# Patient Record
Sex: Female | Born: 1945 | Race: White | Hispanic: No | Marital: Married | Smoking: Never smoker
Health system: Southern US, Community
[De-identification: ages and names within clinical notes are randomized; demographics above are authoritative.]

## PROBLEM LIST (undated history)

## (undated) DIAGNOSIS — F419 Anxiety disorder, unspecified: Secondary | ICD-10-CM

## (undated) DIAGNOSIS — Z8719 Personal history of other diseases of the digestive system: Secondary | ICD-10-CM

## (undated) DIAGNOSIS — G8929 Other chronic pain: Secondary | ICD-10-CM

## (undated) DIAGNOSIS — J189 Pneumonia, unspecified organism: Secondary | ICD-10-CM

## (undated) DIAGNOSIS — N390 Urinary tract infection, site not specified: Secondary | ICD-10-CM

## (undated) DIAGNOSIS — M4800 Spinal stenosis, site unspecified: Secondary | ICD-10-CM

## (undated) DIAGNOSIS — M549 Dorsalgia, unspecified: Secondary | ICD-10-CM

## (undated) DIAGNOSIS — T7840XA Allergy, unspecified, initial encounter: Secondary | ICD-10-CM

## (undated) DIAGNOSIS — E119 Type 2 diabetes mellitus without complications: Secondary | ICD-10-CM

## (undated) DIAGNOSIS — M1990 Unspecified osteoarthritis, unspecified site: Secondary | ICD-10-CM

## (undated) DIAGNOSIS — K219 Gastro-esophageal reflux disease without esophagitis: Secondary | ICD-10-CM

## (undated) DIAGNOSIS — H269 Unspecified cataract: Secondary | ICD-10-CM

## (undated) DIAGNOSIS — I10 Essential (primary) hypertension: Secondary | ICD-10-CM

## (undated) DIAGNOSIS — R0600 Dyspnea, unspecified: Secondary | ICD-10-CM

## (undated) DIAGNOSIS — Z5189 Encounter for other specified aftercare: Secondary | ICD-10-CM

## (undated) DIAGNOSIS — E785 Hyperlipidemia, unspecified: Secondary | ICD-10-CM

## (undated) HISTORY — DX: Unspecified cataract: H26.9

## (undated) HISTORY — DX: Essential (primary) hypertension: I10

## (undated) HISTORY — DX: Allergy, unspecified, initial encounter: T78.40XA

## (undated) HISTORY — DX: Encounter for other specified aftercare: Z51.89

## (undated) HISTORY — DX: Anxiety disorder, unspecified: F41.9

## (undated) HISTORY — DX: Spinal stenosis, site unspecified: M48.00

## (undated) HISTORY — DX: Hyperlipidemia, unspecified: E78.5

## (undated) HISTORY — DX: Unspecified osteoarthritis, unspecified site: M19.90

## (undated) HISTORY — DX: Urinary tract infection, site not specified: N39.0

## (undated) HISTORY — DX: Gastro-esophageal reflux disease without esophagitis: K21.9

## (undated) HISTORY — DX: Other chronic pain: G89.29

## (undated) HISTORY — PX: APPENDECTOMY: SHX54

## (undated) HISTORY — DX: Dorsalgia, unspecified: M54.9

## (undated) HISTORY — PX: ABDOMINAL HYSTERECTOMY: SHX81

## (undated) HISTORY — PX: BACK SURGERY: SHX140

---

## 1971-11-20 HISTORY — PX: ABDOMINAL HYSTERECTOMY: SHX81

## 1977-11-19 HISTORY — PX: TONSILLECTOMY AND ADENOIDECTOMY: SUR1326

## 1978-04-19 HISTORY — PX: SEPTOPLASTY: SUR1290

## 1993-11-19 HISTORY — PX: CHOLECYSTECTOMY: SHX55

## 2003-08-20 HISTORY — PX: OTHER SURGICAL HISTORY: SHX169

## 2004-12-04 ENCOUNTER — Ambulatory Visit: Payer: Self-pay | Admitting: Unknown Physician Specialty

## 2006-02-14 ENCOUNTER — Ambulatory Visit: Payer: Self-pay | Admitting: Unknown Physician Specialty

## 2006-08-19 HISTORY — PX: HERNIA REPAIR: SHX51

## 2007-02-17 ENCOUNTER — Ambulatory Visit: Payer: Self-pay | Admitting: Unknown Physician Specialty

## 2007-02-24 ENCOUNTER — Ambulatory Visit: Payer: Self-pay | Admitting: Gastroenterology

## 2007-07-08 LAB — HM COLONOSCOPY: HM Colonoscopy: NORMAL

## 2008-01-05 ENCOUNTER — Emergency Department: Payer: Self-pay | Admitting: Emergency Medicine

## 2008-02-18 ENCOUNTER — Ambulatory Visit: Payer: Self-pay | Admitting: Unknown Physician Specialty

## 2009-03-10 ENCOUNTER — Ambulatory Visit: Payer: Self-pay | Admitting: Unknown Physician Specialty

## 2010-04-19 ENCOUNTER — Ambulatory Visit: Payer: Self-pay | Admitting: Unknown Physician Specialty

## 2010-11-28 ENCOUNTER — Emergency Department: Payer: Self-pay | Admitting: Internal Medicine

## 2010-12-14 ENCOUNTER — Encounter
Admission: RE | Admit: 2010-12-14 | Discharge: 2010-12-14 | Payer: Self-pay | Source: Home / Self Care | Attending: Orthopedic Surgery | Admitting: Orthopedic Surgery

## 2010-12-18 ENCOUNTER — Other Ambulatory Visit: Payer: Self-pay | Admitting: Orthopedic Surgery

## 2010-12-18 DIAGNOSIS — M545 Low back pain, unspecified: Secondary | ICD-10-CM

## 2010-12-18 DIAGNOSIS — M4310 Spondylolisthesis, site unspecified: Secondary | ICD-10-CM

## 2011-01-03 ENCOUNTER — Other Ambulatory Visit: Payer: Self-pay

## 2011-03-21 ENCOUNTER — Ambulatory Visit
Admission: RE | Admit: 2011-03-21 | Discharge: 2011-03-21 | Disposition: A | Discharge status: Home / Self Care | Payer: Medicare Other | Source: Ambulatory Visit | Attending: Orthopedic Surgery | Admitting: Orthopedic Surgery

## 2011-03-21 DIAGNOSIS — M545 Low back pain, unspecified: Secondary | ICD-10-CM

## 2011-03-21 DIAGNOSIS — M4310 Spondylolisthesis, site unspecified: Secondary | ICD-10-CM

## 2011-04-24 ENCOUNTER — Ambulatory Visit: Payer: Self-pay | Admitting: Unknown Physician Specialty

## 2011-06-13 ENCOUNTER — Other Ambulatory Visit: Payer: Self-pay | Admitting: Orthopedic Surgery

## 2011-06-13 DIAGNOSIS — M545 Low back pain, unspecified: Secondary | ICD-10-CM

## 2011-06-13 DIAGNOSIS — M4310 Spondylolisthesis, site unspecified: Secondary | ICD-10-CM

## 2011-06-15 ENCOUNTER — Ambulatory Visit
Admission: RE | Admit: 2011-06-15 | Discharge: 2011-06-15 | Disposition: A | Discharge status: Home / Self Care | Payer: Medicare Other | Source: Ambulatory Visit | Attending: Orthopedic Surgery | Admitting: Orthopedic Surgery

## 2011-06-15 DIAGNOSIS — M545 Low back pain, unspecified: Secondary | ICD-10-CM

## 2011-06-15 DIAGNOSIS — M4310 Spondylolisthesis, site unspecified: Secondary | ICD-10-CM

## 2011-06-15 MED ORDER — IOHEXOL 180 MG/ML  SOLN
1.0000 mL | Freq: Once | INTRAMUSCULAR | Status: AC | PRN
  Administered 2011-06-15: 1 mL via EPIDURAL

## 2011-06-15 MED ORDER — METHYLPREDNISOLONE ACETATE 40 MG/ML INJ SUSP (RADIOLOG
120.0000 mg | Freq: Once | INTRAMUSCULAR | Status: AC
  Administered 2011-06-15: 120 mg via EPIDURAL

## 2011-06-15 NOTE — Discharge Instructions (Signed)
 Post Procedure Spinal Discharge Instruction Sheet  1. You may resume a regular diet and any medications that you routinely take (including pain medications).  2. No driving day of procedure.  3. Light activity throughout the rest of the day.  Do not do any strenuous work, exercise, bending or lifting.  The day following the procedure, you can resume normal physical activity but you should refrain from exercising or physical therapy for at least three days thereafter.   Common Side Effects:   Headaches- take your usual medications as directed by your physician.  Increase your fluid intake.  Caffeinated beverages may be helpful.  Lie flat in bed until your headache resolves.   Restlessness or inability to sleep- you may have trouble sleeping for the next few days.  Ask your referring physician if you need any medication for sleep.   Facial flushing or redness- should subside within a few days.   Increased pain- a temporary increase in pain a day or two following your procedure is not unusual.  Take your pain medication as prescribed by your referring physician.   Leg cramps  Please contact our office at [phone] for the following symptoms:  Fever greater than 100 degrees.  Headaches unresolved with medication after 2-3 days.  Increased swelling, pain, or redness at injection site.  Thank you for visiting our office.

## 2011-08-24 ENCOUNTER — Other Ambulatory Visit: Payer: Self-pay | Admitting: Physician Assistant

## 2011-08-24 DIAGNOSIS — M549 Dorsalgia, unspecified: Secondary | ICD-10-CM

## 2011-08-28 ENCOUNTER — Ambulatory Visit
Admission: RE | Admit: 2011-08-28 | Discharge: 2011-08-28 | Disposition: A | Discharge status: Home / Self Care | Payer: Medicare Other | Source: Ambulatory Visit | Attending: Physician Assistant | Admitting: Physician Assistant

## 2011-08-28 DIAGNOSIS — M5126 Other intervertebral disc displacement, lumbar region: Secondary | ICD-10-CM

## 2011-08-28 DIAGNOSIS — M549 Dorsalgia, unspecified: Secondary | ICD-10-CM

## 2011-08-28 MED ORDER — IOHEXOL 180 MG/ML  SOLN
1.0000 mL | Freq: Once | INTRAMUSCULAR | Status: AC | PRN
  Administered 2011-08-28: 1 mL via EPIDURAL

## 2011-08-28 MED ORDER — METHYLPREDNISOLONE ACETATE 40 MG/ML INJ SUSP (RADIOLOG
120.0000 mg | Freq: Once | INTRAMUSCULAR | Status: AC
  Administered 2011-08-28: 120 mg via EPIDURAL

## 2011-08-28 NOTE — Discharge Instructions (Signed)
 Post Procedure Spinal Discharge Instruction Sheet  1. You may resume a regular diet and any medications that you routinely take (including pain medications).  2. No driving day of procedure.  3. Light activity throughout the rest of the day.  Do not do any strenuous work, exercise, bending or lifting.  The day following the procedure, you can resume normal physical activity but you should refrain from exercising or physical therapy for at least three days thereafter.   Common Side Effects:   Headaches- take your usual medications as directed by your physician.  Increase your fluid intake.  Caffeinated beverages may be helpful.  Lie flat in bed until your headache resolves.   Restlessness or inability to sleep- you may have trouble sleeping for the next few days.  Ask your referring physician if you need any medication for sleep.   Facial flushing or redness- should subside within a few days.   Increased pain- a temporary increase in pain a day or two following your procedure is not unusual.  Take your pain medication as prescribed by your referring physician.   Leg cramps  Please contact our office at [phone] for the following symptoms:  Fever greater than 100 degrees.  Headaches unresolved with medication after 2-3 days.  Increased swelling, pain, or redness at injection site.  Thank you for visiting our office.

## 2011-10-16 ENCOUNTER — Other Ambulatory Visit: Payer: Self-pay | Admitting: Physician Assistant

## 2011-10-16 DIAGNOSIS — M545 Low back pain, unspecified: Secondary | ICD-10-CM

## 2011-10-18 ENCOUNTER — Ambulatory Visit
Admission: RE | Admit: 2011-10-18 | Discharge: 2011-10-18 | Disposition: A | Discharge status: Home / Self Care | Payer: Medicare Other | Source: Ambulatory Visit | Attending: Physician Assistant | Admitting: Physician Assistant

## 2011-10-18 VITALS — BP 127/56 | HR 49

## 2011-10-18 DIAGNOSIS — M545 Low back pain, unspecified: Secondary | ICD-10-CM

## 2011-10-18 DIAGNOSIS — G8929 Other chronic pain: Secondary | ICD-10-CM | POA: Insufficient documentation

## 2011-10-18 MED ORDER — METHYLPREDNISOLONE ACETATE 40 MG/ML INJ SUSP (RADIOLOG
120.0000 mg | Freq: Once | INTRAMUSCULAR | Status: AC
  Administered 2011-10-18: 120 mg via EPIDURAL

## 2011-10-18 MED ORDER — IOHEXOL 180 MG/ML  SOLN
1.0000 mL | Freq: Once | INTRAMUSCULAR | Status: AC | PRN
  Administered 2011-10-18: 1 mL via EPIDURAL

## 2011-12-03 ENCOUNTER — Ambulatory Visit: Payer: Self-pay

## 2012-01-08 LAB — HM PAP SMEAR

## 2012-02-18 HISTORY — PX: TOTAL HIP ARTHROPLASTY: SHX124

## 2012-02-25 ENCOUNTER — Ambulatory Visit: Payer: Self-pay | Admitting: General Practice

## 2012-02-25 LAB — PROTIME-INR
INR: 0.9
Prothrombin Time: 12.8 s

## 2012-02-25 LAB — URINALYSIS, COMPLETE
Bilirubin,UR: NEGATIVE
Blood: NEGATIVE
Glucose,UR: NEGATIVE mg/dL
Ketone: NEGATIVE
Nitrite: NEGATIVE
Ph: 7
Protein: NEGATIVE
RBC,UR: 1 /HPF
Specific Gravity: 1.017
Squamous Epithelial: 6
WBC UR: 3 /HPF

## 2012-02-25 LAB — CBC
HCT: 37.2 %
HGB: 12.4 g/dL
MCH: 29.4 pg
MCHC: 33.3 g/dL
MCV: 88 fL
Platelet: 236 10*3/uL
RBC: 4.22 X10 6/mm 3
RDW: 13.7 %
WBC: 5.7 10*3/uL

## 2012-02-25 LAB — BASIC METABOLIC PANEL WITH GFR
Anion Gap: 10
BUN: 20 mg/dL — ABNORMAL HIGH
Calcium, Total: 10.1 mg/dL
Chloride: 102 mmol/L
Co2: 28 mmol/L
Creatinine: 1.01 mg/dL
EGFR (African American): >60
EGFR (Non-African Amer.): 58 — ABNORMAL LOW
Glucose: 135 mg/dL — ABNORMAL HIGH
Osmolality: 284
Potassium: 3.4 mmol/L — ABNORMAL LOW
Sodium: 140 mmol/L

## 2012-02-25 LAB — APTT: Activated PTT: 32 s

## 2012-02-25 LAB — SEDIMENTATION RATE: Erythrocyte Sed Rate: 16 mm/h

## 2012-02-25 LAB — MRSA PCR SCREENING

## 2012-02-27 LAB — URINE CULTURE

## 2012-03-12 ENCOUNTER — Inpatient Hospital Stay: Payer: Self-pay | Admitting: General Practice

## 2012-03-13 LAB — BASIC METABOLIC PANEL WITH GFR
Anion Gap: 12
BUN: 12 mg/dL
Calcium, Total: 8.7 mg/dL
Chloride: 107 mmol/L
Co2: 23 mmol/L
Creatinine: 0.95 mg/dL
EGFR (African American): >60
EGFR (Non-African Amer.): >60
Glucose: 133 mg/dL — ABNORMAL HIGH
Osmolality: 285
Potassium: 3.2 mmol/L — ABNORMAL LOW
Sodium: 142 mmol/L

## 2012-03-13 LAB — PLATELET COUNT: Platelet: 146 10*3/uL — ABNORMAL LOW

## 2012-03-13 LAB — HEMOGLOBIN: HGB: 9.8 g/dL — ABNORMAL LOW

## 2012-03-13 LAB — PATHOLOGY REPORT

## 2012-03-14 ENCOUNTER — Encounter: Payer: Self-pay | Admitting: Internal Medicine

## 2012-03-14 LAB — HEMOGLOBIN: HGB: 10 g/dL — ABNORMAL LOW

## 2012-03-14 LAB — BASIC METABOLIC PANEL WITH GFR
Anion Gap: 10
BUN: 8 mg/dL
Calcium, Total: 9.2 mg/dL
Chloride: 101 mmol/L
Co2: 26 mmol/L
Creatinine: 0.85 mg/dL
EGFR (African American): >60
EGFR (Non-African Amer.): >60
Glucose: 141 mg/dL — ABNORMAL HIGH
Osmolality: 275
Potassium: 3.8 mmol/L
Sodium: 137 mmol/L

## 2012-03-14 LAB — PLATELET COUNT: Platelet: 158 10*3/uL

## 2012-03-19 ENCOUNTER — Encounter: Payer: Self-pay | Admitting: Internal Medicine

## 2012-03-21 LAB — URINALYSIS, COMPLETE
Bilirubin,UR: NEGATIVE
Blood: NEGATIVE
Glucose,UR: NEGATIVE mg/dL
Ketone: NEGATIVE
Nitrite: NEGATIVE
Ph: 7
Protein: NEGATIVE
RBC,UR: 1 /HPF
Specific Gravity: 1.012
Squamous Epithelial: 3
WBC UR: 4 /HPF

## 2012-04-19 LAB — HM MAMMOGRAPHY

## 2012-05-14 ENCOUNTER — Ambulatory Visit: Payer: Self-pay | Admitting: Internal Medicine

## 2012-06-04 ENCOUNTER — Other Ambulatory Visit: Payer: Self-pay | Admitting: Orthopedic Surgery

## 2012-06-04 DIAGNOSIS — M549 Dorsalgia, unspecified: Secondary | ICD-10-CM

## 2012-06-09 ENCOUNTER — Ambulatory Visit
Admission: RE | Admit: 2012-06-09 | Discharge: 2012-06-09 | Disposition: A | Discharge status: Home / Self Care | Payer: Medicare Other | Source: Ambulatory Visit | Attending: Orthopedic Surgery | Admitting: Orthopedic Surgery

## 2012-06-09 DIAGNOSIS — M549 Dorsalgia, unspecified: Secondary | ICD-10-CM

## 2012-06-09 MED ORDER — IOHEXOL 180 MG/ML  SOLN
1.0000 mL | Freq: Once | INTRAMUSCULAR | Status: AC | PRN
  Administered 2012-06-09: 1 mL via EPIDURAL

## 2012-06-09 MED ORDER — METHYLPREDNISOLONE ACETATE 40 MG/ML INJ SUSP (RADIOLOG
120.0000 mg | Freq: Once | INTRAMUSCULAR | Status: AC
  Administered 2012-06-09: 120 mg via EPIDURAL

## 2012-06-09 NOTE — Discharge Instructions (Signed)
 Post Procedure Spinal Discharge Instruction Sheet  1. You may resume a regular diet and any medications that you routinely take (including pain medications).  2. No driving day of procedure.  3. Light activity throughout the rest of the day.  Do not do any strenuous work, exercise, bending or lifting.  The day following the procedure, you can resume normal physical activity but you should refrain from exercising or physical therapy for at least three days thereafter.   Common Side Effects:   Headaches- take your usual medications as directed by your physician.  Increase your fluid intake.  Caffeinated beverages may be helpful.  Lie flat in bed until your headache resolves.   Restlessness or inability to sleep- you may have trouble sleeping for the next few days.  Ask your referring physician if you need any medication for sleep.   Facial flushing or redness- should subside within a few days.   Increased pain- a temporary increase in pain a day or two following your procedure is not unusual.  Take your pain medication as prescribed by your referring physician.   Leg cramps  Please contact our office at [phone] for the following symptoms:  Fever greater than 100 degrees.  Headaches unresolved with medication after 2-3 days.  Increased swelling, pain, or redness at injection site.  Thank you for visiting our office.

## 2012-06-10 ENCOUNTER — Other Ambulatory Visit: Payer: Medicare Other

## 2012-07-07 ENCOUNTER — Encounter: Payer: Self-pay | Admitting: Internal Medicine

## 2012-07-07 ENCOUNTER — Ambulatory Visit (INDEPENDENT_AMBULATORY_CARE_PROVIDER_SITE_OTHER): Payer: Medicare Other | Admitting: Internal Medicine

## 2012-07-07 VITALS — BP 142/70 | HR 68 | Temp 99.2°F | Ht 64.5 in | Wt 219.5 lb

## 2012-07-07 DIAGNOSIS — I10 Essential (primary) hypertension: Secondary | ICD-10-CM | POA: Insufficient documentation

## 2012-07-07 DIAGNOSIS — G4700 Insomnia, unspecified: Secondary | ICD-10-CM | POA: Insufficient documentation

## 2012-07-07 DIAGNOSIS — J40 Bronchitis, not specified as acute or chronic: Secondary | ICD-10-CM | POA: Insufficient documentation

## 2012-07-07 DIAGNOSIS — E119 Type 2 diabetes mellitus without complications: Secondary | ICD-10-CM | POA: Insufficient documentation

## 2012-07-07 DIAGNOSIS — E669 Obesity, unspecified: Secondary | ICD-10-CM | POA: Insufficient documentation

## 2012-07-07 MED ORDER — HYDROCOD POLST-CHLORPHEN POLST 10-8 MG/5ML PO LQCR: 5.0000 mL | Freq: Every evening | ORAL | Status: DC | PRN

## 2012-07-07 MED ORDER — AMOXICILLIN-POT CLAVULANATE 875-125 MG PO TABS: 1.0000 | ORAL_TABLET | Freq: Two times a day (BID) | ORAL | Status: DC

## 2012-07-07 MED ORDER — PREDNISONE (PAK) 10 MG PO TABS: ORAL_TABLET | ORAL | Status: DC

## 2012-07-07 NOTE — Assessment & Plan Note (Signed)
Blood sugars well-controlled per patient. Will get records on recent A1c.

## 2012-07-07 NOTE — Assessment & Plan Note (Signed)
BMI 37. Will get records on recent lab work including thyroid function and lipid profile. Will discuss plan for diet and nutrition at next visit once acute episode of bronchitis has resolved.
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
## 2012-07-07 NOTE — Assessment & Plan Note (Signed)
Symptoms well controlled with trazodone. Will continue. 

## 2012-07-07 NOTE — Assessment & Plan Note (Signed)
Symptoms and exam are consistent with bronchitis. Will start Augmentin and prednisone taper. Will restart Advair. Will continue albuterol as needed. Questioned why patient had sudden onset of recurrent bronchitis. Will review records from previous physician. Once this acute episode has resolved, consider PFTs.

## 2012-07-07 NOTE — Assessment & Plan Note (Signed)
Blood pressure well-controlled on current medications. Will continue. Will get records on recent renal function. 

## 2012-07-10 ENCOUNTER — Telehealth: Payer: Self-pay | Admitting: Internal Medicine

## 2012-07-10 NOTE — Telephone Encounter (Signed)
Can we see pt tomorrow?  Pt could hold augmentin tonight and be seen tomorrow as long as no acute changes overnight.

## 2012-07-10 NOTE — Telephone Encounter (Signed)
Patient notified, appt has been made for tomorrow at 4:15.  She will go to urgent care or ER if any acute changes overnight.

## 2012-07-10 NOTE — Telephone Encounter (Signed)
Caller: Erin Camacho/Patient; Phone: [phone]; Reason for Call: Caller: Erin Camacho/Patient; Patient Name: Erin Camacho; PCP: Ronna Polio; Best Callback Phone Number: [phone].  Patient states she was seen in office 07/07/12 for cough/wheezing.  States was diagnosed with Bronchitis and prescribed Prednisone and Augmentin.  Patient states she developed symptoms of anxiety after taking Augmentin 07/09/12.  States felt like her heart was racing, flushing of face.  States she felt "real nervous" and "anxious.  " States she took a Xanax and symptoms relieved in approximately 30-45 minutes.  Patient states she does not want to continue Augmentin until symptoms are reviewed by Dr.  Ronna Polio.  Patient denies having rapid heart rate or feeling of anixety at present.  States last dosage of Augmentin was at 1730 07/09/12.  States Prednisone taper dose is 3 tablets 07/10/12 that she took at [facility] 07/10/12.  States dosage wil be 2 tablets 07/11/12.  Patient states she feels calm at present.  Patient states she continues to have frequent cough.  Patient states she did not fill prescription for Tussionex due to the cost.  States she has been taking over the counter Cheratussin ( Codiene, Guaifenesin) at bedtime with minimal relief.  Patient states she continues to have wheezing at night.  Patient denies wheezing at present.  Afebrile.  Triage per Anxiety and Cough Protocol.  No emergent symptoms identified.  Care advice given per guidelines.  Patient advised increased fluids, warm fluids, inhaled steam, humidifier.  Patient advised to use Advair as prescribed.  Call back parameters reviewed.  Patient verbalizes understanding.  RN attempted to schedule appointment within 4 hours due to positive triage assessment for New or worsening cough and known respiratory condition.  No appointments available for 07/10/12 in Epic Electronic Health Record.  Patient uses [facility] on [address] in [place] at  [phone].  Patient can be reached at [phone].   PLEASE RETURN CALL TO PATIENT WITH RECOMMENDATION REGARDING ABOVE SYMPTOMS.

## 2012-07-11 ENCOUNTER — Ambulatory Visit: Payer: Self-pay | Admitting: Internal Medicine

## 2012-07-11 ENCOUNTER — Ambulatory Visit (INDEPENDENT_AMBULATORY_CARE_PROVIDER_SITE_OTHER): Payer: Medicare Other | Admitting: Internal Medicine

## 2012-07-11 ENCOUNTER — Encounter: Payer: Self-pay | Admitting: Internal Medicine

## 2012-07-11 VITALS — BP 152/90 | HR 77 | Temp 99.8°F | Ht 64.5 in | Wt 219.2 lb

## 2012-07-11 DIAGNOSIS — J189 Pneumonia, unspecified organism: Secondary | ICD-10-CM | POA: Insufficient documentation

## 2012-07-11 MED ORDER — HYDROCODONE-ACETAMINOPHEN 5-500 MG PO TABS: 1.0000 | ORAL_TABLET | Freq: Three times a day (TID) | ORAL | Status: AC | PRN

## 2012-07-11 MED ORDER — LEVOFLOXACIN 750 MG PO TABS: 750.0000 mg | ORAL_TABLET | Freq: Every day | ORAL | Status: DC

## 2012-07-11 NOTE — Assessment & Plan Note (Signed)
Symptoms and exam are consistent with bronchitis versus early left-sided pneumonia. Patient did not tolerate Augmentin and appears to have allergy to this medication. Will change to Levaquin. She will continue to finish prednisone course. She will use hydrocodone as needed for cough. Chest x-ray today. Followup next week. Patient will call sooner if symptoms are worsening or not improving.

## 2012-07-11 NOTE — Progress Notes (Signed)
Subjective:    Patient ID: Erin Camacho, female    DOB: Jul 25, 1946, 66 y.o.   MRN: [id number]  HPI 66 year old female with history of diabetes presents for acute visit complaining of worsening shortness of breath, cough productive of purulent sputum, and fever. She was last seen on 07/07/2012 been diagnosed with early bronchitis. She was started on treatment with Augmentin. After taking approximately 4 doses of Augmentin, she developed nausea and itching on her skin. She has never had allergy in the past but.this may represent allergies to start medication. Symptoms improved after stopping medication. However, she continues to have fever, shortness of breath, and cough productive of purulent sputum. She denies chest pain. She has been using codeine for cough with minimal improvement.  Outpatient Encounter Prescriptions as of 07/11/2012  Medication Sig Dispense Refill  . ALPRAZolam (XANAX) 0.25 MG tablet Take 0.25 mg by mouth at bedtime as needed.      Marland Kitchen amoxicillin (AMOXIL) 500 MG tablet Take 500 mg by mouth every 4 (four) hours. For dental procedures      . calcium carbonate (OS-CAL) 600 MG TABS Take 600 mg by mouth 2 (two) times daily with a meal.      . Cholecalciferol (VITAMIN D-3) 1000 UNITS CAPS Take 1 capsule by mouth daily.      . fexofenadine (ALLEGRA) 180 MG tablet Take 180 mg by mouth daily.      . fluticasone (FLONASE) 50 MCG/ACT nasal spray Place 2 sprays into the nose daily.      Marland Kitchen losartan (COZAAR) 50 MG tablet Take 50 mg by mouth daily.      . metoprolol succinate (TOPROL XL) 25 MG 24 hr tablet Take 25 mg by mouth daily.      . Multiple Vitamin (MULTIVITAMIN) tablet Take 1 tablet by mouth daily.      Marland Kitchen omeprazole (PRILOSEC) 20 MG capsule Take 20 mg by mouth daily.      . predniSONE (STERAPRED UNI-PAK) 10 MG tablet Take 60mg  day 1 then taper by 10mg  daily  21 tablet  0  . simvastatin (ZOCOR) 10 MG tablet Take 5 mg by mouth at bedtime.      . traMADol (ULTRAM) 50 MG tablet Take 50 mg  by mouth every 4 (four) hours.      . traZODone (DESYREL) 50 MG tablet Take 50 mg by mouth at bedtime.      Marland Kitchen HYDROcodone-acetaminophen (VICODIN) 5-500 MG per tablet Take 1 tablet by mouth every 8 (eight) hours as needed (cough).  60 tablet  0  . levofloxacin (LEVAQUIN) 750 MG tablet Take 1 tablet (750 mg total) by mouth daily.  7 tablet  0   BP 152/90  Pulse 77  Temp 99.8 F (37.7 C) (Oral)  Ht 5' 4.5" (1.638 m)  Wt 219 lb 4 oz (99.451 kg)  BMI 37.05 kg/m2  SpO2 97%  Review of Systems  Constitutional: Positive for fever, chills and fatigue. Negative for appetite change and unexpected weight change.  HENT: Positive for congestion. Negative for ear pain, sore throat, trouble swallowing, neck pain, voice change and sinus pressure.   Eyes: Negative for visual disturbance.  Respiratory: Positive for cough and shortness of breath. Negative for wheezing and stridor.   Cardiovascular: Negative for chest pain, palpitations and leg swelling.  Genitourinary: Negative for dysuria and flank pain.  Musculoskeletal: Positive for myalgias. Negative for arthralgias and gait problem.  Skin: Negative for color change and rash.  Neurological: Negative for dizziness and headaches.  Hematological: Negative  for adenopathy. Does not bruise/bleed easily.       Objective:   Physical Exam  Constitutional: She is oriented to person, place, and time. She appears well-developed and well-nourished. No distress.  HENT:  Head: Normocephalic and atraumatic.  Right Ear: External ear normal.  Left Ear: External ear normal.  Nose: Nose normal.  Mouth/Throat: Oropharynx is clear and moist. No oropharyngeal exudate.  Eyes: Conjunctivae are normal. Pupils are equal, round, and reactive to light. Right eye exhibits no discharge. Left eye exhibits no discharge. No scleral icterus.  Neck: Normal range of motion. Neck supple. No tracheal deviation present. No thyromegaly present.  Cardiovascular: Normal rate, regular  rhythm, normal heart sounds and intact distal pulses.  Exam reveals no gallop and no friction rub.   No murmur heard. Pulmonary/Chest: Effort normal. No accessory muscle usage. Not tachypneic. No respiratory distress. She has no decreased breath sounds. She has no wheezes. She has rhonchi in the left upper field and the left middle field. She has no rales. She exhibits no tenderness.  Musculoskeletal: Normal range of motion. She exhibits no edema and no tenderness.  Lymphadenopathy:    She has no cervical adenopathy.  Neurological: She is alert and oriented to person, place, and time. No cranial nerve deficit. She exhibits normal muscle tone. Coordination normal.  Skin: Skin is warm and dry. No rash noted. She is not diaphoretic. No erythema. No pallor.  Psychiatric: She has a normal mood and affect. Her behavior is normal. Judgment and thought content normal.          Assessment & Plan:

## 2012-07-14 ENCOUNTER — Telehealth: Payer: Self-pay | Admitting: Internal Medicine

## 2012-07-14 NOTE — Telephone Encounter (Signed)
CXR normal.

## 2012-07-18 ENCOUNTER — Encounter: Payer: Self-pay | Admitting: Internal Medicine

## 2012-07-18 ENCOUNTER — Telehealth: Payer: Self-pay | Admitting: Internal Medicine

## 2012-07-18 ENCOUNTER — Ambulatory Visit (INDEPENDENT_AMBULATORY_CARE_PROVIDER_SITE_OTHER): Payer: Medicare Other | Admitting: Internal Medicine

## 2012-07-18 VITALS — BP 140/92 | HR 75 | Temp 99.3°F | Ht 64.5 in | Wt 215.2 lb

## 2012-07-18 DIAGNOSIS — R0989 Other specified symptoms and signs involving the circulatory and respiratory systems: Secondary | ICD-10-CM

## 2012-07-18 DIAGNOSIS — J40 Bronchitis, not specified as acute or chronic: Secondary | ICD-10-CM

## 2012-07-18 MED ORDER — METHYLPREDNISOLONE SODIUM SUCC 125 MG IJ SOLR
62.5000 mg | Freq: Once | INTRAMUSCULAR | Status: AC
  Administered 2012-07-18: 62.5 mg via INTRAMUSCULAR

## 2012-07-18 MED ORDER — PREDNISONE (PAK) 10 MG PO TABS: ORAL_TABLET | ORAL | Status: AC

## 2012-07-18 MED ORDER — ALBUTEROL SULFATE (2.5 MG/3ML) 0.083% IN NEBU: 2.5000 mg | INHALATION_SOLUTION | Freq: Four times a day (QID) | RESPIRATORY_TRACT | Status: DC | PRN

## 2012-07-18 MED ORDER — AZITHROMYCIN 250 MG PO TABS: ORAL_TABLET | ORAL | Status: DC

## 2012-07-18 NOTE — Telephone Encounter (Signed)
3:30 patient moved, Erin Camacho added to 3:00

## 2012-07-18 NOTE — Telephone Encounter (Signed)
Can we please try to move the 3:30 pm follow up in effort to see this pt, Erin Camacho at [employer]

## 2012-07-18 NOTE — Telephone Encounter (Signed)
Caller: Theme park manager; Patient Name: Erin Camacho; PCP: Ronna Polio; Best Callback Phone Number: [phone].  Pharmacist calling about nebulizer prescription.  Has Rx for albuterol neb solution, but needs Rx for nebulizer and diagnosis code.  Per Epic, Rx for "albuterol nebulizer solution 2.5 mg/60ml, 1 nebulizer treatment q 6 hours prn wheezing; please dispense with nebulizer machine."  Per Epic, diagnosis code 790 bronchitis; pharmacist advised; will dispense neb machine as ordered in original Rx.

## 2012-07-18 NOTE — Telephone Encounter (Signed)
Caller: Erin Camacho/Patient; Patient Name: Erin Camacho; PCP: Ronna Polio (Adults only); Best Callback Phone Number: [phone].  Call Regarding [place] Follow up.  Patient complains of Cough, Wheezing and difficulty breathing after finishing Antiobiotic on 8-29.  Productive Cough, yellow to green sputum.  Afebrile.  Audible wheezing, Patient denies struggling to breathe, states, difficulty to catch my breath, onset 2 weeks.  All emergent symptoms ruled out per Cough Protocol, see in 24 hours.  No appointments remaining on 8-30.  Please call Patient back to fit in due to productive cough with colored sputum and audible wheezing. Patient verbalized understanding.

## 2012-07-19 NOTE — Progress Notes (Signed)
Subjective:    Patient ID: Erin Camacho, female    DOB: 19-Sep-1946, 66 y.o.   MRN: [id number]  HPI 66YO female presents for acute visit c/o persistent fever, chills, cough productive of purulent sputum. She was treated for bronchitis last week with levaquin and prednisone with no improvement. CXR last week was normal. She notes persistent shortness of breath, wheezing, tightness in her chest. Using albuterol MDI with minimal improvement. Non-smoker. No h/o asthma. Does have h/o pneumonia in past.  Outpatient Encounter Prescriptions as of 07/18/2012  Medication Sig Dispense Refill  . ALPRAZolam (XANAX) 0.25 MG tablet Take 0.25 mg by mouth at bedtime as needed.      Marland Kitchen amoxicillin (AMOXIL) 500 MG tablet Take 500 mg by mouth every 4 (four) hours. For dental procedures      . calcium carbonate (OS-CAL) 600 MG TABS Take 600 mg by mouth 2 (two) times daily with a meal.      . Cholecalciferol (VITAMIN D-3) 1000 UNITS CAPS Take 1 capsule by mouth daily.      . fexofenadine (ALLEGRA) 180 MG tablet Take 180 mg by mouth daily.      . fluticasone (FLONASE) 50 MCG/ACT nasal spray Place 2 sprays into the nose daily.      Marland Kitchen HYDROcodone-acetaminophen (VICODIN) 5-500 MG per tablet Take 1 tablet by mouth every 8 (eight) hours as needed (cough).  60 tablet  0  . losartan (COZAAR) 50 MG tablet Take 50 mg by mouth daily.      . metoprolol succinate (TOPROL XL) 25 MG 24 hr tablet Take 25 mg by mouth daily.      . Multiple Vitamin (MULTIVITAMIN) tablet Take 1 tablet by mouth daily.      Marland Kitchen omeprazole (PRILOSEC) 20 MG capsule Take 20 mg by mouth daily.      . simvastatin (ZOCOR) 10 MG tablet Take 5 mg by mouth at bedtime.      . traMADol (ULTRAM) 50 MG tablet Take 50 mg by mouth every 4 (four) hours.      . traZODone (DESYREL) 50 MG tablet Take 50 mg by mouth at bedtime.      Marland Kitchen albuterol (PROVENTIL) (2.5 MG/3ML) 0.083% nebulizer solution Take 3 mLs (2.5 mg total) by nebulization every 6 (six) hours as needed for wheezing.  Please disp with nebulizer machine  150 mL  1  . azithromycin (ZITHROMAX Z-PAK) 250 MG tablet Take 2 pills day 1, then 1 pill daily days 2-5  6 each  0  . predniSONE (STERAPRED UNI-PAK) 10 MG tablet Take 60mg  day 1 then taper by 10mg  daily  21 tablet  0  . DISCONTD: levofloxacin (LEVAQUIN) 750 MG tablet Take 1 tablet (750 mg total) by mouth daily.  7 tablet  0  . DISCONTD: predniSONE (STERAPRED UNI-PAK) 10 MG tablet Take 60mg  day 1 then taper by 10mg  daily  21 tablet  0   Facility-Administered Encounter Medications as of 07/18/2012  Medication Dose Route Frequency Provider Last Rate Last Dose  . methylPREDNISolone sodium succinate (SOLU-MEDROL) 125 mg/2 mL injection 62.5 mg  62.5 mg Intramuscular Once Shelia Media, MD   62.5 mg at 07/18/12 1612    Review of Systems  Constitutional: Positive for fever, chills and fatigue. Negative for unexpected weight change.  HENT: Negative for hearing loss, ear pain, nosebleeds, congestion, sore throat, facial swelling, rhinorrhea, sneezing, mouth sores, trouble swallowing, neck pain, neck stiffness, voice change, postnasal drip, sinus pressure, tinnitus and ear discharge.   Eyes: Negative for pain,  discharge, redness and visual disturbance.  Respiratory: Positive for cough, chest tightness and shortness of breath. Negative for wheezing and stridor.   Cardiovascular: Negative for chest pain, palpitations and leg swelling.  Musculoskeletal: Negative for myalgias and arthralgias.  Skin: Negative for color change and rash.  Neurological: Negative for dizziness, weakness, light-headedness and headaches.  Hematological: Negative for adenopathy.       Objective:   Physical Exam  Constitutional: She is oriented to person, place, and time. She appears well-developed and well-nourished. No distress.  HENT:  Head: Normocephalic and atraumatic.  Right Ear: External ear normal.  Left Ear: External ear normal.  Nose: Nose normal.  Mouth/Throat: Oropharynx is  clear and moist. No oropharyngeal exudate.  Eyes: Conjunctivae are normal. Pupils are equal, round, and reactive to light. Right eye exhibits no discharge. Left eye exhibits no discharge. No scleral icterus.  Neck: Normal range of motion. Neck supple. No tracheal deviation present. No thyromegaly present.  Cardiovascular: Normal rate, regular rhythm, normal heart sounds and intact distal pulses.  Exam reveals no gallop and no friction rub.   No murmur heard. Pulmonary/Chest: Effort normal. No accessory muscle usage. Not tachypneic. No respiratory distress. She has no decreased breath sounds. She has wheezes. She has rhonchi. She has no rales. She exhibits no tenderness.  Musculoskeletal: Normal range of motion. She exhibits no edema and no tenderness.  Lymphadenopathy:    She has no cervical adenopathy.  Neurological: She is alert and oriented to person, place, and time. No cranial nerve deficit. She exhibits normal muscle tone. Coordination normal.  Skin: Skin is warm and dry. No rash noted. She is not diaphoretic. No erythema. No pallor.  Psychiatric: She has a normal mood and affect. Her behavior is normal. Judgment and thought content normal.          Assessment & Plan:

## 2012-07-19 NOTE — Assessment & Plan Note (Signed)
Symptoms and exam consistent with bronchitis. Pt is s/p treatment with Levaquin and Prednisone with minimal improvement. Will start course of azithromycin and repeat prednisone taper. Will also start albuterol by nebulizer at home. Pt will follow up next week. If any recurrent fever or chills or if symptoms not improving this weekend, may need admission for IV antibiotics, prednisone. Note that CXR last week was normal.

## 2012-07-22 MED ORDER — ALBUTEROL SULFATE (2.5 MG/3ML) 0.083% IN NEBU
2.5000 mg | INHALATION_SOLUTION | Freq: Once | RESPIRATORY_TRACT | Status: AC
  Administered 2012-07-22: 2.5 mg via RESPIRATORY_TRACT

## 2012-07-22 NOTE — Addendum Note (Signed)
Addended by: Gilmer Mor on: 07/22/2012 09:38 AM   Modules accepted: Orders

## 2012-07-23 ENCOUNTER — Ambulatory Visit (INDEPENDENT_AMBULATORY_CARE_PROVIDER_SITE_OTHER): Payer: Medicare Other | Admitting: Internal Medicine

## 2012-07-23 ENCOUNTER — Encounter: Payer: Self-pay | Admitting: Internal Medicine

## 2012-07-23 VITALS — BP 140/90 | HR 68 | Temp 98.7°F | Ht 64.5 in | Wt 219.2 lb

## 2012-07-23 DIAGNOSIS — J40 Bronchitis, not specified as acute or chronic: Secondary | ICD-10-CM

## 2012-07-23 NOTE — Progress Notes (Signed)
Subjective:    Patient ID: Erin Camacho, female    DOB: [birth year], 66 y.o.   MRN: [id number]  HPI 66 year old female presents for followup of recent episode of bronchitis. She reports that over the last few days symptoms have significantly improved. She continues to have some fatigue and occasional episodes of cough but denies shortness of breath, cough productive of purulent sputum, or chest pain. She denies fever or chills. She has one more day left on antibiotics and prednisone. She continues to use albuterol typically twice per day.  Outpatient Encounter Prescriptions as of 07/23/2012  Medication Sig Dispense Refill  . albuterol (PROVENTIL) (2.5 MG/3ML) 0.083% nebulizer solution Take 3 mLs (2.5 mg total) by nebulization every 6 (six) hours as needed for wheezing. Please disp with nebulizer machine  150 mL  1  . ALPRAZolam (XANAX) 0.25 MG tablet Take 0.25 mg by mouth at bedtime as needed.      Marland Kitchen amoxicillin (AMOXIL) 500 MG tablet Take 500 mg by mouth every 4 (four) hours. For dental procedures      . calcium carbonate (OS-CAL) 600 MG TABS Take 600 mg by mouth 2 (two) times daily with a meal.      . Cholecalciferol (VITAMIN D-3) 1000 UNITS CAPS Take 1 capsule by mouth daily.      . fexofenadine (ALLEGRA) 180 MG tablet Take 180 mg by mouth daily.      . fluticasone (FLONASE) 50 MCG/ACT nasal spray Place 2 sprays into the nose daily.      Marland Kitchen losartan (COZAAR) 50 MG tablet Take 50 mg by mouth daily.      . metoprolol succinate (TOPROL XL) 25 MG 24 hr tablet Take 25 mg by mouth daily.      . Multiple Vitamin (MULTIVITAMIN) tablet Take 1 tablet by mouth daily.      Marland Kitchen omeprazole (PRILOSEC) 20 MG capsule Take 20 mg by mouth daily.      . predniSONE (STERAPRED UNI-PAK) 10 MG tablet Take 60mg  day 1 then taper by 10mg  daily  21 tablet  0  . simvastatin (ZOCOR) 10 MG tablet Take 5 mg by mouth at bedtime.      . traMADol (ULTRAM) 50 MG tablet Take 50 mg by mouth every 4 (four) hours.      . traZODone  (DESYREL) 50 MG tablet Take 50 mg by mouth at bedtime.      Marland Kitchen DISCONTD: azithromycin (ZITHROMAX Z-PAK) 250 MG tablet Take 2 pills day 1, then 1 pill daily days 2-5  6 each  0    Review of Systems  Constitutional: Positive for fatigue. Negative for fever, chills, appetite change and unexpected weight change.  HENT: Negative for ear pain, congestion, sore throat, trouble swallowing, neck pain, voice change and sinus pressure.   Eyes: Negative for visual disturbance.  Respiratory: Positive for cough and wheezing. Negative for shortness of breath and stridor.   Cardiovascular: Negative for chest pain, palpitations and leg swelling.  Gastrointestinal: Negative for nausea, vomiting, abdominal pain, diarrhea, constipation, blood in stool, abdominal distention and anal bleeding.  Genitourinary: Negative for dysuria and flank pain.  Musculoskeletal: Negative for myalgias, arthralgias and gait problem.  Skin: Negative for color change and rash.  Neurological: Negative for dizziness and headaches.  Hematological: Negative for adenopathy. Does not bruise/bleed easily.  Psychiatric/Behavioral: Negative for suicidal ideas, disturbed wake/sleep cycle and dysphoric mood. The patient is not nervous/anxious.    BP 140/90  Pulse 68  Temp 98.7 F (37.1 C) (Oral)  Ht 5'  4.5" (1.638 m)  Wt 219 lb 4 oz (99.451 kg)  BMI 37.05 kg/m2  SpO2 97%     Objective:   Physical Exam  Constitutional: She is oriented to person, place, and time. She appears well-developed and well-nourished. No distress.  HENT:  Head: Normocephalic and atraumatic.  Right Ear: External ear normal.  Left Ear: External ear normal.  Nose: Nose normal.  Mouth/Throat: Oropharynx is clear and moist. No oropharyngeal exudate.  Eyes: Conjunctivae are normal. Pupils are equal, round, and reactive to light. Right eye exhibits no discharge. Left eye exhibits no discharge. No scleral icterus.  Neck: Normal range of motion. Neck supple. No  tracheal deviation present. No thyromegaly present.  Cardiovascular: Normal rate, regular rhythm, normal heart sounds and intact distal pulses.  Exam reveals no gallop and no friction rub.   No murmur heard. Pulmonary/Chest: Effort normal. No accessory muscle usage. Not tachypneic. No respiratory distress. She has decreased breath sounds. She has no wheezes. She has no rhonchi. She has no rales. She exhibits no tenderness.  Musculoskeletal: Normal range of motion. She exhibits no edema and no tenderness.  Lymphadenopathy:    She has no cervical adenopathy.  Neurological: She is alert and oriented to person, place, and time. No cranial nerve deficit. She exhibits normal muscle tone. Coordination normal.  Skin: Skin is warm and dry. No rash noted. She is not diaphoretic. No erythema. No pallor.  Psychiatric: She has a normal mood and affect. Her behavior is normal. Judgment and thought content normal.          Assessment & Plan:

## 2012-07-23 NOTE — Assessment & Plan Note (Signed)
Symptoms markedly improved with repeat course of prednisone and use of albuterol. Will continue to monitor. Followup in one month or sooner as needed.

## 2012-08-07 ENCOUNTER — Ambulatory Visit: Payer: Medicare Other | Admitting: Internal Medicine

## 2012-08-22 ENCOUNTER — Ambulatory Visit (INDEPENDENT_AMBULATORY_CARE_PROVIDER_SITE_OTHER): Payer: Medicare Other | Admitting: Internal Medicine

## 2012-08-22 ENCOUNTER — Encounter: Payer: Self-pay | Admitting: Internal Medicine

## 2012-08-22 VITALS — BP 112/82 | HR 70 | Temp 99.3°F | Ht 64.5 in | Wt 223.8 lb

## 2012-08-22 DIAGNOSIS — J309 Allergic rhinitis, unspecified: Secondary | ICD-10-CM

## 2012-08-22 DIAGNOSIS — E785 Hyperlipidemia, unspecified: Secondary | ICD-10-CM | POA: Insufficient documentation

## 2012-08-22 DIAGNOSIS — G4700 Insomnia, unspecified: Secondary | ICD-10-CM

## 2012-08-22 DIAGNOSIS — J019 Acute sinusitis, unspecified: Secondary | ICD-10-CM | POA: Insufficient documentation

## 2012-08-22 DIAGNOSIS — E7800 Pure hypercholesterolemia, unspecified: Secondary | ICD-10-CM | POA: Insufficient documentation

## 2012-08-22 DIAGNOSIS — I10 Essential (primary) hypertension: Secondary | ICD-10-CM

## 2012-08-22 DIAGNOSIS — R609 Edema, unspecified: Secondary | ICD-10-CM | POA: Insufficient documentation

## 2012-08-22 MED ORDER — LOSARTAN POTASSIUM-HCTZ 50-12.5 MG PO TABS: 1.0000 | ORAL_TABLET | Freq: Every day | ORAL | Status: DC

## 2012-08-22 MED ORDER — METOPROLOL SUCCINATE ER 25 MG PO TB24: 25.0000 mg | ORAL_TABLET | Freq: Every day | ORAL | Status: DC

## 2012-08-22 MED ORDER — TRAZODONE HCL 50 MG PO TABS: 50.0000 mg | ORAL_TABLET | Freq: Every day | ORAL | Status: DC

## 2012-08-22 MED ORDER — AZITHROMYCIN 250 MG PO TABS: ORAL_TABLET | ORAL | Status: DC

## 2012-08-22 MED ORDER — TRAMADOL HCL 50 MG PO TABS: 50.0000 mg | ORAL_TABLET | ORAL | Status: DC

## 2012-08-22 MED ORDER — FLUTICASONE PROPIONATE 50 MCG/ACT NA SUSP: 2.0000 | Freq: Every day | NASAL | Status: DC

## 2012-08-22 NOTE — Assessment & Plan Note (Signed)
Symptoms most consistent with acute sinusitis. Will treat with azithromycin. Patient will continue afrin as needed at bedtime. Continue prn mucinex and allegra. Followup if symptoms are not improving over the next several days.

## 2012-08-22 NOTE — Assessment & Plan Note (Signed)
Pressure is well-controlled with no certain however patient having some issues with edema, so will change to losartan hydrochlorothiazide. Patient will followup in one month for recheck.

## 2012-08-22 NOTE — Progress Notes (Signed)
Subjective:    Patient ID: Erin Camacho, female    DOB: 03-Oct-1946, 66 y.o.   MRN: [id number]  HPI 66 year old female with history of diet controlled diabetes, hypertension presents for acute visit complaining of one-week history of nasal congestion and sinus pressure. She was recently treated for bronchitis approximately one month ago and reports that symptoms had improved significantly until about one or 2 weeks ago when she started to develop clear nasal congestion and frontal sinus pressure. She has also had some fever but no chills. She has occasional nonproductive cough. She denies any shortness of breath. She has been using Allegra with no improvement in her symptoms. She has also been using occasional Afrin spray with no improvement. Next  She is also concerned about some edema in her hands and feet. She reports that she was previously on hydrochlorothiazide but this medication had been stopped. She has noticed the swelling since that time. She reports full compliance with her Cozaar. She reports blood pressures have been well-controlled.  She is also concerned about the use of simvastatin. She notes that she would like to stop this medication because of potential side effects. She is interested in trying to control her cholesterol with diet.  Outpatient Encounter Prescriptions as of 08/22/2012  Medication Sig Dispense Refill  . albuterol (PROVENTIL) (2.5 MG/3ML) 0.083% nebulizer solution Take 3 mLs (2.5 mg total) by nebulization every 6 (six) hours as needed for wheezing. Please disp with nebulizer machine  150 mL  1  . ALPRAZolam (XANAX) 0.25 MG tablet Take 0.25 mg by mouth at bedtime as needed.      Marland Kitchen amoxicillin (AMOXIL) 500 MG tablet Take 500 mg by mouth every 4 (four) hours. For dental procedures      . Cholecalciferol (VITAMIN D-3) 1000 UNITS CAPS Take 1 capsule by mouth daily.      . fexofenadine (ALLEGRA) 180 MG tablet Take 180 mg by mouth daily.      . fluticasone (FLONASE) 50  MCG/ACT nasal spray Place 2 sprays into the nose daily.  48 g  3  . metoprolol succinate (TOPROL XL) 25 MG 24 hr tablet Take 1 tablet (25 mg total) by mouth daily.  90 tablet  3  . Multiple Vitamin (MULTIVITAMIN) tablet Take 1 tablet by mouth daily.      . naproxen (NAPROSYN) 500 MG tablet Take 500 mg by mouth 2 (two) times daily with a meal.      . omeprazole (PRILOSEC) 20 MG capsule Take 20 mg by mouth daily.      . traMADol (ULTRAM) 50 MG tablet Take 1 tablet (50 mg total) by mouth every 4 (four) hours.  180 tablet  3  . traZODone (DESYREL) 50 MG tablet Take 1 tablet (50 mg total) by mouth at bedtime.  90 tablet  3  . vitamin B-12 (CYANOCOBALAMIN) 1000 MCG tablet Take 1,000 mcg by mouth daily.       BP 112/82  Pulse 70  Temp 99.3 F (37.4 C) (Oral)  Ht 5' 4.5" (1.638 m)  Wt 223 lb 12 oz (101.492 kg)  BMI 37.81 kg/m2  SpO2 99%  Review of Systems  Constitutional: Positive for fatigue. Negative for fever, chills, appetite change and unexpected weight change.  HENT: Positive for congestion and sinus pressure. Negative for ear pain, sore throat, trouble swallowing, neck pain and voice change.   Eyes: Negative for visual disturbance.  Respiratory: Positive for cough. Negative for shortness of breath, wheezing and stridor.   Cardiovascular: Positive for  leg swelling. Negative for chest pain and palpitations.  Gastrointestinal: Negative for nausea, vomiting, abdominal pain, diarrhea, constipation, blood in stool, abdominal distention and anal bleeding.  Genitourinary: Negative for dysuria and flank pain.  Musculoskeletal: Negative for myalgias, arthralgias and gait problem.  Skin: Negative for color change and rash.  Neurological: Negative for dizziness and headaches.  Hematological: Negative for adenopathy. Does not bruise/bleed easily.  Psychiatric/Behavioral: Negative for suicidal ideas, disturbed wake/sleep cycle and dysphoric mood. The patient is not nervous/anxious.          Objective:   Physical Exam  Constitutional: She is oriented to person, place, and time. She appears well-developed and well-nourished. No distress.  HENT:  Head: Normocephalic and atraumatic.  Right Ear: External ear normal.  Left Ear: External ear normal.  Nose: Mucosal edema present. Right sinus exhibits frontal sinus tenderness. Left sinus exhibits frontal sinus tenderness.  Mouth/Throat: Oropharynx is clear and moist. No oropharyngeal exudate.  Eyes: Conjunctivae normal are normal. Pupils are equal, round, and reactive to light. Right eye exhibits no discharge. Left eye exhibits no discharge. No scleral icterus.  Neck: Normal range of motion. Neck supple. No tracheal deviation present. No thyromegaly present.  Cardiovascular: Normal rate, regular rhythm, normal heart sounds and intact distal pulses.  Exam reveals no gallop and no friction rub.   No murmur heard. Pulmonary/Chest: Effort normal and breath sounds normal. No accessory muscle usage. Not tachypneic. No respiratory distress. She has no decreased breath sounds. She has no wheezes. She has no rhonchi. She has no rales. She exhibits no tenderness.  Musculoskeletal: Normal range of motion. She exhibits no edema and no tenderness.  Lymphadenopathy:    She has no cervical adenopathy.  Neurological: She is alert and oriented to person, place, and time. No cranial nerve deficit. She exhibits normal muscle tone. Coordination normal.  Skin: Skin is warm and dry. No rash noted. She is not diaphoretic. No erythema. No pallor.  Psychiatric: She has a normal mood and affect. Her behavior is normal. Judgment and thought content normal.          Assessment & Plan:

## 2012-08-22 NOTE — Assessment & Plan Note (Signed)
As above, patient having some issues with swelling in her lower extremities and hands. Will add hydrochlorothiazide. Followup one month.

## 2012-08-22 NOTE — Assessment & Plan Note (Signed)
Patient would like to come off simvastatin. We'll plan to keep her off simvastatin for one month and recheck lipids to see where they are. Followup one month.

## 2012-09-01 ENCOUNTER — Other Ambulatory Visit: Payer: Self-pay | Admitting: Physician Assistant

## 2012-09-01 DIAGNOSIS — M549 Dorsalgia, unspecified: Secondary | ICD-10-CM

## 2012-09-02 ENCOUNTER — Ambulatory Visit
Admission: RE | Admit: 2012-09-02 | Discharge: 2012-09-02 | Disposition: A | Discharge status: Home / Self Care | Payer: Medicare Other | Source: Ambulatory Visit | Attending: Physician Assistant | Admitting: Physician Assistant

## 2012-09-02 VITALS — BP 154/79 | HR 66

## 2012-09-02 DIAGNOSIS — M549 Dorsalgia, unspecified: Secondary | ICD-10-CM

## 2012-09-02 MED ORDER — IOHEXOL 180 MG/ML  SOLN
1.0000 mL | Freq: Once | INTRAMUSCULAR | Status: AC | PRN
  Administered 2012-09-02: 1 mL via EPIDURAL

## 2012-09-02 MED ORDER — METHYLPREDNISOLONE ACETATE 40 MG/ML INJ SUSP (RADIOLOG
120.0000 mg | Freq: Once | INTRAMUSCULAR | Status: AC
  Administered 2012-09-02: 120 mg via EPIDURAL

## 2012-09-19 ENCOUNTER — Other Ambulatory Visit (INDEPENDENT_AMBULATORY_CARE_PROVIDER_SITE_OTHER): Payer: Medicare Other

## 2012-09-19 ENCOUNTER — Telehealth: Payer: Self-pay | Admitting: Internal Medicine

## 2012-09-19 DIAGNOSIS — E785 Hyperlipidemia, unspecified: Secondary | ICD-10-CM

## 2012-09-19 LAB — COMPREHENSIVE METABOLIC PANEL WITH GFR
ALT: 13 U/L (ref 0–35)
AST: 19 U/L (ref 0–37)
Albumin: 3.9 g/dL (ref 3.5–5.2)
Alkaline Phosphatase: 61 U/L (ref 39–117)
BUN: 29 mg/dL — ABNORMAL HIGH (ref 6–23)
CO2: 26 meq/L (ref 19–32)
Calcium: 9.4 mg/dL (ref 8.4–10.5)
Chloride: 106 meq/L (ref 96–112)
Creatinine, Ser: 0.9 mg/dL (ref 0.4–1.2)
GFR: 69.88 mL/min
Glucose, Bld: 107 mg/dL — ABNORMAL HIGH (ref 70–99)
Potassium: 3.7 meq/L (ref 3.5–5.1)
Sodium: 140 meq/L (ref 135–145)
Total Bilirubin: 0.7 mg/dL (ref 0.3–1.2)
Total Protein: 6.5 g/dL (ref 6.0–8.3)

## 2012-09-19 LAB — LIPID PANEL
Cholesterol: 224 mg/dL — ABNORMAL HIGH (ref 0–200)
HDL: 61.3 mg/dL
Total CHOL/HDL Ratio: 4
Triglycerides: 90 mg/dL (ref 0.0–149.0)
VLDL: 18 mg/dL (ref 0.0–40.0)

## 2012-09-19 LAB — LDL CHOLESTEROL, DIRECT: Direct LDL: 146.5 mg/dL

## 2012-09-19 MED ORDER — ZOSTER VACCINE LIVE 19400 UNT/0.65ML ~~LOC~~ SOLR: 0.6500 mL | Freq: Once | SUBCUTANEOUS | Status: DC

## 2012-09-19 NOTE — Telephone Encounter (Signed)
Gave rx to pt while she was here with her husband.

## 2012-09-19 NOTE — Telephone Encounter (Signed)
Pt is wanting an rx for the shingles vaccine.

## 2012-09-19 NOTE — Telephone Encounter (Signed)
Rx printed

## 2012-09-25 ENCOUNTER — Encounter: Payer: Self-pay | Admitting: Internal Medicine

## 2012-09-25 ENCOUNTER — Ambulatory Visit (INDEPENDENT_AMBULATORY_CARE_PROVIDER_SITE_OTHER): Payer: Medicare Other | Admitting: Internal Medicine

## 2012-09-25 VITALS — BP 114/78 | HR 57 | Temp 98.2°F | Ht 64.5 in | Wt 219.5 lb

## 2012-09-25 DIAGNOSIS — E785 Hyperlipidemia, unspecified: Secondary | ICD-10-CM

## 2012-09-25 DIAGNOSIS — Z23 Encounter for immunization: Secondary | ICD-10-CM

## 2012-09-25 DIAGNOSIS — R609 Edema, unspecified: Secondary | ICD-10-CM

## 2012-09-25 DIAGNOSIS — I10 Essential (primary) hypertension: Secondary | ICD-10-CM

## 2012-09-25 MED ORDER — LOSARTAN POTASSIUM-HCTZ 50-12.5 MG PO TABS: 1.0000 | ORAL_TABLET | Freq: Every day | ORAL | Status: DC

## 2012-09-25 MED ORDER — SIMVASTATIN 10 MG PO TABS: 5.0000 mg | ORAL_TABLET | Freq: Every day | ORAL | Status: DC

## 2012-09-25 NOTE — Assessment & Plan Note (Signed)
Blood pressure well-controlled on current medications. Will continue current medications. Followup for annual physical exam in February 2014

## 2012-09-25 NOTE — Assessment & Plan Note (Signed)
Symptoms of edema have improved with use of hydrochlorothiazide. We'll continue to monitor.

## 2012-09-25 NOTE — Progress Notes (Signed)
Subjective:    Patient ID: Erin Camacho, female    DOB: 11-10-1946, 66 y.o.   MRN: [id number]  HPI 66 year old female with history of hypertension, diabetes presents for followup. At her last visit, we changed to losartan to losartan hydrochlorothiazide. She reports significant improvement in edema in her hands and feet with change in medication. She denies any side effect from the medicine. She reports she is generally feeling well. She denies any new concerns today.  Outpatient Encounter Prescriptions as of 09/25/2012  Medication Sig Dispense Refill  . albuterol (PROVENTIL) (2.5 MG/3ML) 0.083% nebulizer solution Take 3 mLs (2.5 mg total) by nebulization every 6 (six) hours as needed for wheezing. Please disp with nebulizer machine  150 mL  1  . ALPRAZolam (XANAX) 0.25 MG tablet Take 0.25 mg by mouth at bedtime as needed.      Marland Kitchen amoxicillin (AMOXIL) 500 MG tablet Take 500 mg by mouth every 4 (four) hours. For dental procedures      . azithromycin (ZITHROMAX Z-PAK) 250 MG tablet Take 2 pills day 1, then 1 pill daily days 2-5  6 each  0  . Cholecalciferol (VITAMIN D-3) 1000 UNITS CAPS Take 1 capsule by mouth daily.      . fexofenadine (ALLEGRA) 180 MG tablet Take 180 mg by mouth daily.      . fluticasone (FLONASE) 50 MCG/ACT nasal spray Place 2 sprays into the nose daily.  48 g  3  . losartan-hydrochlorothiazide (HYZAAR) 50-12.5 MG per tablet Take 1 tablet by mouth daily.  90 tablet  3  . metoprolol succinate (TOPROL XL) 25 MG 24 hr tablet Take 1 tablet (25 mg total) by mouth daily.  90 tablet  3  . Multiple Vitamin (MULTIVITAMIN) tablet Take 1 tablet by mouth daily.      . naproxen (NAPROSYN) 500 MG tablet Take 500 mg by mouth 2 (two) times daily with a meal.      . naproxen sodium (ANAPROX) 220 MG tablet Take 220 mg by mouth 2 (two) times daily with a meal.      . omeprazole (PRILOSEC) 20 MG capsule Take 20 mg by mouth daily.      . traMADol (ULTRAM) 50 MG tablet Take 1 tablet (50 mg total) by  mouth every 4 (four) hours.  180 tablet  3  . traZODone (DESYREL) 50 MG tablet Take 1 tablet (50 mg total) by mouth at bedtime.  90 tablet  3  . vitamin B-12 (CYANOCOBALAMIN) 1000 MCG tablet Take 1,000 mcg by mouth daily.      . [DISCONTINUED] losartan-hydrochlorothiazide (HYZAAR) 50-12.5 MG per tablet Take 1 tablet by mouth daily.  30 tablet  3  . simvastatin (ZOCOR) 10 MG tablet Take 0.5 tablets (5 mg total) by mouth at bedtime.  90 tablet  3  . [DISCONTINUED] zoster vaccine live, PF, (ZOSTAVAX) 40981 UNT/0.65ML injection Inject 19,400 Units into the skin once.  1 each  0   BP 114/78  Pulse 57  Temp 98.2 F (36.8 C) (Oral)  Ht 5' 4.5" (1.638 m)  Wt 219 lb 8 oz (99.565 kg)  BMI 37.10 kg/m2  SpO2 98%  Review of Systems  Constitutional: Negative for fever, chills, appetite change, fatigue and unexpected weight change.  HENT: Negative for ear pain, congestion, sore throat, trouble swallowing, neck pain, voice change and sinus pressure.   Eyes: Negative for visual disturbance.  Respiratory: Negative for cough, shortness of breath, wheezing and stridor.   Cardiovascular: Negative for chest pain, palpitations and leg  swelling.  Gastrointestinal: Negative for nausea, vomiting, abdominal pain, diarrhea, constipation, blood in stool, abdominal distention and anal bleeding.  Genitourinary: Negative for dysuria and flank pain.  Musculoskeletal: Negative for myalgias, arthralgias and gait problem.  Skin: Negative for color change and rash.  Neurological: Negative for dizziness and headaches.  Hematological: Negative for adenopathy. Does not bruise/bleed easily.  Psychiatric/Behavioral: Negative for suicidal ideas, sleep disturbance and dysphoric mood. The patient is not nervous/anxious.        Objective:   Physical Exam  Constitutional: She is oriented to person, place, and time. She appears well-developed and well-nourished. No distress.  HENT:  Head: Normocephalic and atraumatic.  Right  Ear: External ear normal.  Left Ear: External ear normal.  Nose: Nose normal.  Mouth/Throat: Oropharynx is clear and moist. No oropharyngeal exudate.  Eyes: Conjunctivae normal are normal. Pupils are equal, round, and reactive to light. Right eye exhibits no discharge. Left eye exhibits no discharge. No scleral icterus.  Neck: Normal range of motion. Neck supple. No tracheal deviation present. No thyromegaly present.  Cardiovascular: Normal rate, regular rhythm, normal heart sounds and intact distal pulses.  Exam reveals no gallop and no friction rub.   No murmur heard. Pulmonary/Chest: Effort normal and breath sounds normal. No respiratory distress. She has no wheezes. She has no rales. She exhibits no tenderness.  Musculoskeletal: Normal range of motion. She exhibits no edema and no tenderness.  Lymphadenopathy:    She has no cervical adenopathy.  Neurological: She is alert and oriented to person, place, and time. No cranial nerve deficit. She exhibits normal muscle tone. Coordination normal.  Skin: Skin is warm and dry. No rash noted. She is not diaphoretic. No erythema. No pallor.  Psychiatric: She has a normal mood and affect. Her behavior is normal. Judgment and thought content normal.          Assessment & Plan:

## 2012-10-23 ENCOUNTER — Other Ambulatory Visit: Payer: Self-pay | Admitting: General Practice

## 2012-10-23 MED ORDER — NAPROXEN 500 MG PO TABS: 500.0000 mg | ORAL_TABLET | Freq: Two times a day (BID) | ORAL | Status: DC

## 2012-10-23 NOTE — Telephone Encounter (Signed)
Med filled.  

## 2012-10-27 ENCOUNTER — Other Ambulatory Visit: Payer: Self-pay | Admitting: Physician Assistant

## 2012-10-27 DIAGNOSIS — M545 Low back pain, unspecified: Secondary | ICD-10-CM

## 2012-10-27 DIAGNOSIS — M51369 Other intervertebral disc degeneration, lumbar region without mention of lumbar back pain or lower extremity pain: Secondary | ICD-10-CM

## 2012-10-27 DIAGNOSIS — M5136 Other intervertebral disc degeneration, lumbar region: Secondary | ICD-10-CM

## 2012-10-31 ENCOUNTER — Ambulatory Visit
Admission: RE | Admit: 2012-10-31 | Discharge: 2012-10-31 | Disposition: A | Discharge status: Home / Self Care | Payer: Medicare Other | Source: Ambulatory Visit | Attending: Physician Assistant | Admitting: Physician Assistant

## 2012-10-31 DIAGNOSIS — M545 Low back pain, unspecified: Secondary | ICD-10-CM

## 2012-10-31 DIAGNOSIS — M51369 Other intervertebral disc degeneration, lumbar region without mention of lumbar back pain or lower extremity pain: Secondary | ICD-10-CM

## 2012-10-31 DIAGNOSIS — M5136 Other intervertebral disc degeneration, lumbar region: Secondary | ICD-10-CM

## 2012-10-31 MED ORDER — METHYLPREDNISOLONE ACETATE 40 MG/ML INJ SUSP (RADIOLOG
120.0000 mg | Freq: Once | INTRAMUSCULAR | Status: AC
  Administered 2012-10-31: 120 mg via EPIDURAL

## 2012-10-31 MED ORDER — IOHEXOL 180 MG/ML  SOLN
1.0000 mL | Freq: Once | INTRAMUSCULAR | Status: AC | PRN
  Administered 2012-10-31: 1 mL via EPIDURAL

## 2012-12-08 ENCOUNTER — Encounter: Payer: Self-pay | Admitting: Adult Health

## 2012-12-08 ENCOUNTER — Ambulatory Visit (INDEPENDENT_AMBULATORY_CARE_PROVIDER_SITE_OTHER): Payer: Medicare Other | Admitting: Adult Health

## 2012-12-08 VITALS — BP 120/83 | HR 66 | Temp 98.5°F | Resp 16 | Ht 65.0 in | Wt 228.0 lb

## 2012-12-08 DIAGNOSIS — J329 Chronic sinusitis, unspecified: Secondary | ICD-10-CM | POA: Insufficient documentation

## 2012-12-08 MED ORDER — AZITHROMYCIN 250 MG PO TABS: ORAL_TABLET | ORAL | Status: DC

## 2012-12-08 NOTE — Assessment & Plan Note (Addendum)
Given patient's hx of emphysema, bronchitis and PNA, will start azithromycin at this time. Continue nebulizer tx as needed for wheezing and flonase nasal spray. RTC if not improved within 3-4 days.

## 2012-12-08 NOTE — Progress Notes (Signed)
Subjective:    Patient ID: Ceclia Camacho, female    DOB: 02-19-1946, 67 y.o.   MRN: [id number]  HPI  Patient presents to clinic with c/o sore throat, irritation in pharynx, sneezing, sinus congestion and pressure, cough and wheezing. She reports her secretions are yellow. She has not tried any OTC products because she reports they do not work. She has been using her nebulizer. The symptoms came on suddenly on Friday. Denies fever, chills or shortness of breath. Patient reports that, in the past, if she has waited before coming to see a provider her symptoms have become severe and "it will go into my chest". Patient also reports hx of pneumonia.   Current Outpatient Prescriptions on File Prior to Visit  Medication Sig Dispense Refill  . Cholecalciferol (VITAMIN D-3) 1000 UNITS CAPS Take 1 capsule by mouth daily.      . fexofenadine (ALLEGRA) 180 MG tablet Take 180 mg by mouth daily.      . fluticasone (FLONASE) 50 MCG/ACT nasal spray Place 2 sprays into the nose daily.  48 g  3  . losartan-hydrochlorothiazide (HYZAAR) 50-12.5 MG per tablet Take 1 tablet by mouth daily.  90 tablet  3  . metoprolol succinate (TOPROL XL) 25 MG 24 hr tablet Take 1 tablet (25 mg total) by mouth daily.  90 tablet  3  . Multiple Vitamin (MULTIVITAMIN) tablet Take 1 tablet by mouth daily.      Marland Kitchen omeprazole (PRILOSEC) 20 MG capsule Take 20 mg by mouth daily.      . simvastatin (ZOCOR) 10 MG tablet Take 0.5 tablets (5 mg total) by mouth at bedtime.  90 tablet  3  . traMADol (ULTRAM) 50 MG tablet Take 1 tablet (50 mg total) by mouth every 4 (four) hours.  180 tablet  3  . traZODone (DESYREL) 50 MG tablet Take 1 tablet (50 mg total) by mouth at bedtime.  90 tablet  3  . vitamin B-12 (CYANOCOBALAMIN) 1000 MCG tablet Take 1,000 mcg by mouth daily.      Marland Kitchen albuterol (PROVENTIL) (2.5 MG/3ML) 0.083% nebulizer solution Take 3 mLs (2.5 mg total) by nebulization every 6 (six) hours as needed for wheezing. Please disp with nebulizer  machine  150 mL  1  . ALPRAZolam (XANAX) 0.25 MG tablet Take 0.25 mg by mouth at bedtime as needed.      Marland Kitchen amoxicillin (AMOXIL) 500 MG tablet Take 500 mg by mouth every 4 (four) hours. For dental procedures      . naproxen (NAPROSYN) 500 MG tablet Take 1 tablet (500 mg total) by mouth 2 (two) times daily with a meal.  180 tablet  0      Review of Systems  Constitutional: Positive for fatigue. Negative for fever and chills.  HENT: Positive for congestion, sore throat, sneezing and postnasal drip.   Respiratory: Positive for cough and wheezing. Negative for shortness of breath.   Gastrointestinal: Negative.   Genitourinary: Negative.   Neurological: Negative.   Psychiatric/Behavioral: Negative.     BP 120/83  Pulse 66  Temp 98.5 F (36.9 C) (Oral)  Ht 5\' 5"  (1.651 m)  Wt 228 lb (103.42 kg)  BMI 37.94 kg/m2  SpO2 96%      Objective:   Physical Exam  Constitutional: She is oriented to person, place, and time. She appears well-developed and well-nourished.  HENT:  Head: Normocephalic and atraumatic.  Right Ear: External ear normal.  Left Ear: External ear normal.  Mouth/Throat: No oropharyngeal exudate.  Oropharynx erythematous without exudate.  Eyes: Conjunctivae normal are normal. Pupils are equal, round, and reactive to light. Right eye exhibits no discharge. Left eye exhibits no discharge.  Neck: Normal range of motion. Neck supple.  Cardiovascular: Normal rate, regular rhythm and normal heart sounds.  Exam reveals no friction rub.   No murmur heard. Pulmonary/Chest: Effort normal. She has wheezes. She has no rales.  Abdominal: Soft. Bowel sounds are normal.  Musculoskeletal: Normal range of motion.  Lymphadenopathy:    She has no cervical adenopathy.  Neurological: She is alert and oriented to person, place, and time.  Skin: Skin is warm and dry. No rash noted.  Psychiatric: She has a normal mood and affect. Her behavior is normal. Judgment and thought content  normal.       Assessment & Plan:

## 2012-12-08 NOTE — Patient Instructions (Addendum)
   Start antibiotic today. Also start flonase 2 sprays in each nostril daily.  Flush sinuses daily. You can use a simple saline spray that is sold OTC.  For throat irritation you can gargle with salt water. A teaspoon of honey may also provide relief.  Chloraseptic throat spray or lozenges are available over the counter and will provide relief.  Drink fluids to stay hydrated.  For a cough you can try some Robitussin DM or a generic brand that also has DM as part of the name.

## 2013-01-07 ENCOUNTER — Encounter: Payer: Self-pay | Admitting: Internal Medicine

## 2013-01-07 ENCOUNTER — Ambulatory Visit (INDEPENDENT_AMBULATORY_CARE_PROVIDER_SITE_OTHER): Payer: Medicare Other | Admitting: Internal Medicine

## 2013-01-07 VITALS — BP 120/70 | HR 62 | Temp 98.1°F | Ht 65.0 in | Wt 230.0 lb

## 2013-01-07 DIAGNOSIS — G8929 Other chronic pain: Secondary | ICD-10-CM | POA: Insufficient documentation

## 2013-01-07 DIAGNOSIS — Z0000 Encounter for general adult medical examination without abnormal findings: Secondary | ICD-10-CM | POA: Insufficient documentation

## 2013-01-07 DIAGNOSIS — M549 Dorsalgia, unspecified: Secondary | ICD-10-CM

## 2013-01-07 DIAGNOSIS — J329 Chronic sinusitis, unspecified: Secondary | ICD-10-CM | POA: Insufficient documentation

## 2013-01-07 DIAGNOSIS — M129 Arthropathy, unspecified: Secondary | ICD-10-CM

## 2013-01-07 DIAGNOSIS — E669 Obesity, unspecified: Secondary | ICD-10-CM

## 2013-01-07 DIAGNOSIS — E785 Hyperlipidemia, unspecified: Secondary | ICD-10-CM

## 2013-01-07 DIAGNOSIS — M1990 Unspecified osteoarthritis, unspecified site: Secondary | ICD-10-CM | POA: Insufficient documentation

## 2013-01-07 DIAGNOSIS — I10 Essential (primary) hypertension: Secondary | ICD-10-CM

## 2013-01-07 LAB — COMPREHENSIVE METABOLIC PANEL WITH GFR
ALT: 17 U/L (ref 0–35)
AST: 21 U/L (ref 0–37)
Albumin: 4.4 g/dL (ref 3.5–5.2)
Alkaline Phosphatase: 68 U/L (ref 39–117)
BUN: 28 mg/dL — ABNORMAL HIGH (ref 6–23)
CO2: 30 meq/L (ref 19–32)
Calcium: 10.1 mg/dL (ref 8.4–10.5)
Chloride: 105 meq/L (ref 96–112)
Creatinine, Ser: 0.9 mg/dL (ref 0.4–1.2)
GFR: 70.76 mL/min
Glucose, Bld: 114 mg/dL — ABNORMAL HIGH (ref 70–99)
Potassium: 3.8 meq/L (ref 3.5–5.1)
Sodium: 142 meq/L (ref 135–145)
Total Bilirubin: 0.8 mg/dL (ref 0.3–1.2)
Total Protein: 7.1 g/dL (ref 6.0–8.3)

## 2013-01-07 LAB — LIPID PANEL
Cholesterol: 162 mg/dL (ref 0–200)
HDL: 56.4 mg/dL
LDL Cholesterol: 88 mg/dL (ref 0–99)
Total CHOL/HDL Ratio: 3
Triglycerides: 90 mg/dL (ref 0.0–149.0)
VLDL: 18 mg/dL (ref 0.0–40.0)

## 2013-01-07 MED ORDER — AMOXICILLIN 500 MG PO CAPS: 500.0000 mg | ORAL_CAPSULE | Freq: Three times a day (TID) | ORAL | Status: DC

## 2013-01-07 MED ORDER — NAPROXEN 500 MG PO TABS: 500.0000 mg | ORAL_TABLET | Freq: Two times a day (BID) | ORAL | Status: DC

## 2013-01-07 NOTE — Assessment & Plan Note (Signed)
General medical exam including breast exam normal today.  PAP and pelvic deferred given pt age and pt preference. Appropriate screening performed.  Will check labs including CMP and lipids. Information given on HCPOA. Health maintenance is UTD

## 2013-01-07 NOTE — Assessment & Plan Note (Signed)
Body mass index is 38.27 kg/(m^2).  Encouraged healthy diet, low in saturated fat and high in fiber. Encouraged regular physical activity such as walking with goal of 3 days per week.

## 2013-01-07 NOTE — Assessment & Plan Note (Signed)
Symptoms consistent with chronic sinusitis. Will try additional course of amoxicillin to see if any improvement. If no improvement, would favor ENT evaluation and allergy testing.

## 2013-01-07 NOTE — Progress Notes (Signed)
Subjective:    Patient ID: Erin Camacho, female    DOB: 12/11/1945, 67 y.o.   MRN: [id number]  HPI The patient is here for annual Medicare wellness examination and management of other chronic and acute problems.   The risk factors are reflected in the social history.  The roster of all physicians providing medical care to patient - is listed in the Snapshot section of the chart.  Activities of daily living:  The patient is 100% independent in all ADLs: dressing, toileting, feeding as well as independent mobility  Home safety : The patient has smoke detectors and CO detector in the home. They wear seatbelts.  There are no firearms at home. There is no violence in the home.   There is no risks for hepatitis, STDs or HIV. There is no history of blood transfusion. They have no travel history to infectious disease endemic areas of the world.  The patient has seen their dentist in the last six month. (Dr. Earlene Plater) They have seen their eye doctor in the last year. (Dr. Alexia Freestone) Evaluated for hearing loss by Dr. Elenore Rota in 2012, normal. They have deferred audiologic testing in the last year.   They do not  have excessive sun exposure. Discussed the need for sun protection: hats, long sleeves and use of sunscreen if there is significant sun exposure. (Derm - Dr. Sueanne Margarita)  Diet: the importance of a healthy diet is discussed. They do have a relatively healthy diet. Notes increased sweets. Trying to limit.  The benefits of regular aerobic exercise were discussed. She is a member of a gym, was doing water walking. Has not gone recently.  Depression screen: there are no signs or vegative symptoms of depression- irritability, change in appetite, anhedonia, sadness/tearfullness.  Cognitive assessment: the patient manages all their financial and personal affairs and is actively engaged. They could relate day,date,year and events.  HCPOA - POA - husband, Arnette Banales.  The following portions  of the patient's history were reviewed and updated as appropriate: allergies, current medications, past family history, past medical history,  past surgical history, past social history  and problem list.  Visual acuity was not assessed per patient preference since she has regular follow up with her ophthalmologist. Hearing and body mass index were assessed and reviewed.   During the course of the visit the patient was educated and counseled about appropriate screening and preventive services including : fall prevention , diabetes screening, nutrition counseling, colorectal cancer screening, and recommended immunizations.    Patient is also concerned today about several week history of nasal congestion, purulent nasal drainage, and facial pressure and pain. Symptoms previously improved with use of amoxicillin but have now recurred. Patient denies any fever or chills. She does have occasional cough which is nonproductive. She denies shortness of breath.  Patient is also concerned about chronic back pain. Over the last several months this has gotten progressively worse. In the past, she was followed by orthopedic surgeon and had epidural steroid injection with some improvement. Presently, she is taking tramadol with minimal improvement in her pain. She notes that the tramadol often leads to constipation. Pain is described as aching in her lower back which radiates down the back of her thighs.   Outpatient Encounter Prescriptions as of 01/07/2013  Medication Sig Dispense Refill  . albuterol (PROVENTIL) (2.5 MG/3ML) 0.083% nebulizer solution Take 3 mLs (2.5 mg total) by nebulization every 6 (six) hours as needed for wheezing. Please disp with nebulizer machine  150 mL  1  .  ALPRAZolam (XANAX) 0.25 MG tablet Take 0.25 mg by mouth at bedtime as needed.      . Cholecalciferol (VITAMIN D-3) 1000 UNITS CAPS Take 1 capsule by mouth daily.      . fexofenadine (ALLEGRA) 180 MG tablet Take 180 mg by mouth daily.       . fluticasone (FLONASE) 50 MCG/ACT nasal spray Place 2 sprays into the nose daily.  48 g  3  . losartan-hydrochlorothiazide (HYZAAR) 50-12.5 MG per tablet Take 1 tablet by mouth daily.  90 tablet  3  . metoprolol succinate (TOPROL XL) 25 MG 24 hr tablet Take 1 tablet (25 mg total) by mouth daily.  90 tablet  3  . Multiple Vitamin (MULTIVITAMIN) tablet Take 1 tablet by mouth daily.      . naproxen (NAPROSYN) 500 MG tablet Take 1 tablet (500 mg total) by mouth 2 (two) times daily with a meal.  180 tablet  4  . omeprazole (PRILOSEC) 20 MG capsule Take 20 mg by mouth daily.      . simvastatin (ZOCOR) 10 MG tablet Take 0.5 tablets (5 mg total) by mouth at bedtime.  90 tablet  3  . traMADol (ULTRAM) 50 MG tablet Take 1 tablet (50 mg total) by mouth every 4 (four) hours.  180 tablet  3  . traZODone (DESYREL) 50 MG tablet Take 1 tablet (50 mg total) by mouth at bedtime.  90 tablet  3  . vitamin B-12 (CYANOCOBALAMIN) 1000 MCG tablet Take 1,000 mcg by mouth daily.       No facility-administered encounter medications on file as of 01/07/2013.   BP 120/70  Pulse 62  Temp(Src) 98.1 F (36.7 C) (Oral)  Ht 5\' 5"  (1.651 m)  Wt 230 lb (104.327 kg)  BMI 38.27 kg/m2  SpO2 97%  Review of Systems  Constitutional: Negative for fever, chills, appetite change, fatigue and unexpected weight change.  HENT: Positive for congestion, postnasal drip and sinus pressure. Negative for ear pain, sore throat, trouble swallowing, neck pain and voice change.   Eyes: Negative for visual disturbance.  Respiratory: Negative for cough, shortness of breath, wheezing and stridor.   Cardiovascular: Negative for chest pain, palpitations and leg swelling.  Gastrointestinal: Negative for nausea, vomiting, abdominal pain, diarrhea, constipation, blood in stool, abdominal distention and anal bleeding.  Genitourinary: Negative for dysuria and flank pain.  Musculoskeletal: Negative for myalgias, arthralgias and gait problem.  Skin:  Negative for color change and rash.  Neurological: Negative for dizziness and headaches.  Hematological: Negative for adenopathy. Does not bruise/bleed easily.  Psychiatric/Behavioral: Negative for suicidal ideas, sleep disturbance and dysphoric mood. The patient is not nervous/anxious.        Objective:   Physical Exam  Constitutional: She is oriented to person, place, and time. She appears well-developed and well-nourished. No distress.  HENT:  Head: Normocephalic and atraumatic.  Right Ear: External ear normal. A middle ear effusion is present.  Left Ear: External ear normal. A middle ear effusion is present.  Nose: Mucosal edema present. Right sinus exhibits maxillary sinus tenderness. Left sinus exhibits maxillary sinus tenderness.  Mouth/Throat: Oropharynx is clear and moist. No oropharyngeal exudate.  Eyes: Conjunctivae are normal. Pupils are equal, round, and reactive to light. Right eye exhibits no discharge. Left eye exhibits no discharge. No scleral icterus.  Neck: Normal range of motion. Neck supple. No tracheal deviation present. No thyromegaly present.  Cardiovascular: Normal rate, regular rhythm, normal heart sounds and intact distal pulses.  Exam reveals no gallop and  no friction rub.   No murmur heard. Pulmonary/Chest: Effort normal and breath sounds normal. No accessory muscle usage. Not tachypneic. No respiratory distress. She has no decreased breath sounds. She has no wheezes. She has no rhonchi. She has no rales. She exhibits no tenderness. Right breast exhibits no inverted nipple, no mass, no nipple discharge, no skin change and no tenderness. Left breast exhibits no inverted nipple, no mass, no nipple discharge, no skin change and no tenderness. Breasts are symmetrical.  Abdominal: Soft. Bowel sounds are normal. She exhibits no distension and no mass. There is no tenderness. There is no rebound and no guarding.  Musculoskeletal: Normal range of motion. She exhibits no  edema and no tenderness.       Lumbar back: She exhibits pain. She exhibits normal range of motion and no tenderness.  Lymphadenopathy:    She has no cervical adenopathy.  Neurological: She is alert and oriented to person, place, and time. No cranial nerve deficit. She exhibits normal muscle tone. Coordination normal.  Skin: Skin is warm and dry. No rash noted. She is not diaphoretic. No erythema. No pallor.  Psychiatric: She has a normal mood and affect. Her behavior is normal. Judgment and thought content normal.          Assessment & Plan:

## 2013-01-07 NOTE — Assessment & Plan Note (Signed)
Symptoms poorly controlled with Naprosyn and Tramadol. Question if she might benefit from repeat epidural steroid injection. Will set up evaluation with Dr. Yves Dill.

## 2013-01-07 NOTE — Assessment & Plan Note (Signed)
BP Readings from Last 3 Encounters:  01/07/13 120/70  12/08/12 120/83  10/31/12 168/89   BP well controlled on current medications. Will continue.

## 2013-02-06 ENCOUNTER — Encounter: Payer: Self-pay | Admitting: Adult Health

## 2013-02-06 ENCOUNTER — Ambulatory Visit (INDEPENDENT_AMBULATORY_CARE_PROVIDER_SITE_OTHER): Payer: Medicare Other | Admitting: Adult Health

## 2013-02-06 VITALS — BP 126/92 | HR 69 | Temp 98.3°F | Resp 16 | Ht 65.0 in | Wt 229.0 lb

## 2013-02-06 DIAGNOSIS — J329 Chronic sinusitis, unspecified: Secondary | ICD-10-CM

## 2013-02-06 MED ORDER — LEVOFLOXACIN 500 MG PO TABS: 500.0000 mg | ORAL_TABLET | Freq: Every day | ORAL | Status: DC

## 2013-02-06 NOTE — Patient Instructions (Addendum)
  Please start your antibiotic today. You will take this for 7 days.  Drink fluids.  I recommend that you schedule an appointment to see Dr. Jenne Campus for your allergies. I believe this is contributing to your symptoms.  Please call if you are not any better within 4-5 days.

## 2013-02-06 NOTE — Assessment & Plan Note (Signed)
Just completed a course of amoxicillin. Now she reports coughing up yellow mucus. Will start Levaquin x 7 days. Instructed to make appointment for Dr. Jenne Campus for her allergies. I suspect that her environmental allergies are contributing to her persistent sinus symptoms.

## 2013-02-06 NOTE — Progress Notes (Signed)
Subjective:    Patient ID: Erin Camacho, female    DOB: 02-11-1946, 67 y.o.   MRN: [id number]  HPI  Presents to clinic with sinus pressure, yellow drainage and coughing up "yellow chunks". She is afebrile. Denies fever, chills. She reports occasional wheezing and uses her albuterol nebulizer. Pt also reports having environmental allergies which she used to get allergy shots for. She has not done this in some time now.   Current Outpatient Prescriptions on File Prior to Visit  Medication Sig Dispense Refill  . albuterol (PROVENTIL) (2.5 MG/3ML) 0.083% nebulizer solution Take 3 mLs (2.5 mg total) by nebulization every 6 (six) hours as needed for wheezing. Please disp with nebulizer machine  150 mL  1  . ALPRAZolam (XANAX) 0.25 MG tablet Take 0.25 mg by mouth at bedtime as needed.      . Cholecalciferol (VITAMIN D-3) 1000 UNITS CAPS Take 1 capsule by mouth daily.      . fexofenadine (ALLEGRA) 180 MG tablet Take 180 mg by mouth daily.      . fluticasone (FLONASE) 50 MCG/ACT nasal spray Place 2 sprays into the nose daily.  48 g  3  . losartan-hydrochlorothiazide (HYZAAR) 50-12.5 MG per tablet Take 1 tablet by mouth daily.  90 tablet  3  . metoprolol succinate (TOPROL XL) 25 MG 24 hr tablet Take 1 tablet (25 mg total) by mouth daily.  90 tablet  3  . Multiple Vitamin (MULTIVITAMIN) tablet Take 1 tablet by mouth daily.      . naproxen (NAPROSYN) 500 MG tablet Take 1 tablet (500 mg total) by mouth 2 (two) times daily with a meal.  180 tablet  4  . omeprazole (PRILOSEC) 20 MG capsule Take 20 mg by mouth daily.      . simvastatin (ZOCOR) 10 MG tablet Take 0.5 tablets (5 mg total) by mouth at bedtime.  90 tablet  3  . traMADol (ULTRAM) 50 MG tablet Take 1 tablet (50 mg total) by mouth every 4 (four) hours.  180 tablet  3  . traZODone (DESYREL) 50 MG tablet Take 1 tablet (50 mg total) by mouth at bedtime.  90 tablet  3  . vitamin B-12 (CYANOCOBALAMIN) 1000 MCG tablet Take 1,000 mcg by mouth daily.        No current facility-administered medications on file prior to visit.    Review of Systems  Constitutional: Positive for chills. Negative for fever.  HENT: Positive for congestion and postnasal drip.   Respiratory: Positive for cough and wheezing. Negative for shortness of breath.   Cardiovascular: Negative for chest pain.  Psychiatric/Behavioral: Negative.     BP 126/92  Pulse 69  Temp(Src) 98.3 F (36.8 C) (Oral)  Resp 16  Ht 5\' 5"  (1.651 m)  Wt 229 lb (103.874 kg)  BMI 38.11 kg/m2  SpO2 96%     Objective:   Physical Exam  Constitutional: She is oriented to person, place, and time. She appears well-developed and well-nourished. No distress.  HENT:  Head: Normocephalic and atraumatic.  Right Ear: External ear normal.  Left Ear: External ear normal.  Post nasal drip - drainage noted on pharynx  Cardiovascular: Normal rate, regular rhythm and normal heart sounds.   No murmur heard. Pulmonary/Chest: Effort normal and breath sounds normal. She has no wheezes. She has no rales.  Musculoskeletal: Normal range of motion.  Neurological: She is alert and oriented to person, place, and time.  Skin: Skin is warm and dry.  Psychiatric: She has a normal mood  and affect. Her behavior is normal. Judgment and thought content normal.    BP 126/92  Pulse 69  Temp(Src) 98.3 F (36.8 C) (Oral)  Resp 16  Ht 5\' 5"  (1.651 m)  Wt 229 lb (103.874 kg)  BMI 38.11 kg/m2  SpO2 96%     Assessment & Plan:

## 2013-03-04 ENCOUNTER — Ambulatory Visit: Payer: Self-pay | Admitting: Physical Medicine and Rehabilitation

## 2013-03-09 ENCOUNTER — Emergency Department: Payer: Self-pay | Admitting: Emergency Medicine

## 2013-03-12 ENCOUNTER — Telehealth: Payer: Self-pay | Admitting: Internal Medicine

## 2013-03-12 MED ORDER — LOSARTAN POTASSIUM 50 MG PO TABS: 50.0000 mg | ORAL_TABLET | Freq: Every day | ORAL | Status: DC

## 2013-03-12 NOTE — Telephone Encounter (Signed)
Went to Dr. Collier Bullock, a neurosurgeon. She is going to have to have back surgery, her right side is hurting. Before she was taking the Losartan without the diuretic in it and she would like to switch back to that because it is too much for her to have to get up and down on it. The only reason she is currently taking the combination blood pressure pill now is because she requested it and now she does not need the diuretic.Would like this called in to [facility]

## 2013-03-12 NOTE — Telephone Encounter (Signed)
Rx sent to pharmacy and patient is aware 

## 2013-03-12 NOTE — Telephone Encounter (Signed)
The patient is wanting her blood pressure medication losartan-hydrochlorothiazide (HYZAAR) 50-12.5 MG per tablet  without the diuretic.

## 2013-03-12 NOTE — Telephone Encounter (Signed)
Fine to call in Losartan 50mg  daily #30 with 3 refills. She will need to have a follow up to recheck BP in within 1 month, since we are making this change.

## 2013-03-13 ENCOUNTER — Telehealth: Payer: Self-pay | Admitting: Internal Medicine

## 2013-03-13 NOTE — Telephone Encounter (Signed)
Patient Information:  Caller Name: Shiva  Phone: [phone]  Patient: Erin Camacho, Erin Camacho  Gender: Female  DOB: Feb 26, 1946  Age: 67 Years  PCP: Ronna Polio (Adults only)  Office Follow Up:  Does the office need to follow up with this patient?: Yes  Instructions For The Office: she is requesting antibotic to get rid of this infection she has currently. She cannot walk she is in a Wheelchair. pharmacy [facility] -[phone].  PLEASE ADVISE PATIENT CONCERNING MEDICATION REQUEST.  RN Note:  she is requesting antibotic to get rid of this infection she has currently. She cannot walk she is in a Wheelchair. pharmacy [facility] -[phone].  PLEASE ADVISE PATIENT CONCERNING MEDICATION REQUEST.  Symptoms  Reason For Call & Symptoms: Patient states she is getting ready for back surgery with Neurosurgeon Dr. Barnett [facility]. She developed congestion onset yesterday 03/12/13.  She is concerned about the congestion prior to a surgery date.  +cough productive yelllow, cheeks rosy, body aches, nasal congestion.   She is unable to travel due to back pain and right leg issues. right leg swollen with nerve being pinched.  She is requesting antiobiotic because she needs to have her back surgery (they are calling her with a date  Reviewed Health History In EMR: Yes  Reviewed Medications In EMR: Yes  Reviewed Allergies In EMR: Yes  Reviewed Surgeries / Procedures: Yes  Date of Onset of Symptoms: 03/12/2013  Treatments Tried: Oxycodone 10mg  1 1/2 tabs for pain/Gabapentin increased for her pain.  Albuterol Nebulizer on hand but has not used it.  Treatments Tried Worked: No  Guideline(s) Used:  Cough  Disposition Per Guideline:   Home Care  Reason For Disposition Reached:   Cough with cold symptoms (e.g., runny nose, postnasal drip, throat clearing)  Advice Given:  Cough Medicines:  OTC Cough Drops: Cough drops can help a lot, especially for mild coughs. They reduce coughing by soothing your  irritated throat and removing that tickle sensation in the back of the throat. Cough drops also have the advantage of portability - you can carry them with you.  Home Remedy - Hard Candy: Hard candy works just as well as medicine-flavored OTC cough drops. Diabetics should use sugar-free candy.  Home Remedy - Honey: This old home remedy has been shown to help decrease coughing at night. The adult dosage is 2 teaspoons (10 ml) at bedtime. Honey should not be given to infants under one year of age.  Coughing Spasms:  Drink warm fluids. Inhale warm mist (Reason: both relax the airway and loosen up the phlegm).  Suck on cough drops or hard candy to coat the irritated throat.  Prevent Dehydration:  Drink adequate liquids.  This will help soothe an irritated or dry throat and loosen up the phlegm.  Avoid Tobacco Smoke:  Smoking or being exposed to smoke makes coughs much worse.  Call Back If:  Difficulty breathing  Fever lasts > 3 days  You become worse.  RN Overrode Recommendation:  Patient Requests Prescription  she is requesting antibotic to get rid of this infection she has currently. She cannot walk she is in a Wheelchair. pharmacy [facility] -[phone].  PLEASE ADVISE PATIENT CONCERNING MEDICATION REQUEST.

## 2013-03-13 NOTE — Telephone Encounter (Signed)
Spoke with patient she state she is having some congestion and cough, been coughing up some thick phelgm. No fever or other symptoms, would like to know if anything can be called in? Explained to patient Dr. Dan Humphreys was not in this afternoon and she most likely need to be seen because she can not be treated with antibiotics without being seen. Told her I would send a message to one of the physician here now, she asked if Raquel or Dr. Lorin Picket would do this for her?

## 2013-03-13 NOTE — Telephone Encounter (Signed)
With her medical history and recent treatment with Levaquin, she needs evaluation - to determine best treatment.  She can take robitussin DM for cough and congestion and flush nose with saline - but needs to be seen.

## 2013-03-13 NOTE — Telephone Encounter (Signed)
Patient informed and verbally agreed.  

## 2013-03-16 ENCOUNTER — Other Ambulatory Visit: Payer: Self-pay | Admitting: *Deleted

## 2013-03-16 DIAGNOSIS — J40 Bronchitis, not specified as acute or chronic: Secondary | ICD-10-CM

## 2013-03-16 MED ORDER — LOSARTAN POTASSIUM 50 MG PO TABS: 50.0000 mg | ORAL_TABLET | Freq: Every day | ORAL | Status: DC

## 2013-03-16 MED ORDER — ALBUTEROL SULFATE (2.5 MG/3ML) 0.083% IN NEBU: 2.5000 mg | INHALATION_SOLUTION | Freq: Four times a day (QID) | RESPIRATORY_TRACT | Status: DC | PRN

## 2013-03-16 NOTE — Telephone Encounter (Signed)
Eprescribed.

## 2013-03-16 NOTE — Telephone Encounter (Signed)
Patient stated that the pharmacy did not receive the new prescription for Losartan 50 mg. Can you resend or call it into [facility].

## 2013-03-16 NOTE — Addendum Note (Signed)
Addended by: Theola Sequin on: 03/16/2013 04:45 PM   Modules accepted: Orders

## 2013-03-17 ENCOUNTER — Other Ambulatory Visit: Payer: Self-pay | Admitting: *Deleted

## 2013-03-17 DIAGNOSIS — J40 Bronchitis, not specified as acute or chronic: Secondary | ICD-10-CM

## 2013-03-17 MED ORDER — ALBUTEROL SULFATE (2.5 MG/3ML) 0.083% IN NEBU: 2.5000 mg | INHALATION_SOLUTION | Freq: Four times a day (QID) | RESPIRATORY_TRACT | Status: DC | PRN

## 2013-03-17 NOTE — Telephone Encounter (Signed)
Eprescribed.

## 2013-03-17 NOTE — Telephone Encounter (Signed)
Pharmacy Note:  Albuterol   Need Diagnostic code on Rx Please resend

## 2013-03-18 ENCOUNTER — Encounter: Payer: Self-pay | Admitting: Internal Medicine

## 2013-03-18 ENCOUNTER — Ambulatory Visit (INDEPENDENT_AMBULATORY_CARE_PROVIDER_SITE_OTHER): Payer: Medicare Other | Admitting: Internal Medicine

## 2013-03-18 VITALS — BP 122/80 | HR 92 | Temp 99.0°F | Wt 221.0 lb

## 2013-03-18 DIAGNOSIS — G8929 Other chronic pain: Secondary | ICD-10-CM

## 2013-03-18 DIAGNOSIS — M549 Dorsalgia, unspecified: Secondary | ICD-10-CM

## 2013-03-18 DIAGNOSIS — J209 Acute bronchitis, unspecified: Secondary | ICD-10-CM | POA: Insufficient documentation

## 2013-03-18 MED ORDER — LEVOFLOXACIN 500 MG PO TABS: 500.0000 mg | ORAL_TABLET | Freq: Every day | ORAL | Status: DC

## 2013-03-18 MED ORDER — PREDNISONE (PAK) 10 MG PO TABS: ORAL_TABLET | ORAL | Status: DC

## 2013-03-18 NOTE — Progress Notes (Signed)
Subjective:    Patient ID: Erin Camacho, female    DOB: 02/12/1946, 67 y.o.   MRN: [id number]  HPI 67YO female with history of asthma, hypertension presents for acute visit complaining of five-day history of nasal congestion and cough. She reports that symptoms began on Friday. Initially, she had clear nasal drainage, general malaise, subjective fever and chills, and nonproductive cough. Over the course of the weekend, she developed a productive cough with yellow sputum. She denies chest pain. She notes some shortness of breath and wheezing. She's been using albuterol with minimal improvement. She reports that she called our office to get an antibiotic but was told that she would need to be seen. She expresses some frustration with this and feels that the prescription should have been called in without a visit.  She also reports ongoing severe right-sided hip pain and pain in her lower back that radiates to her legs. She is scheduled for lumbar fusion with her neurosurgeon. She is currently using Percocet for pain. Her husband who is with her today notes some confusion and drowsiness with this medication. She has not been driving.  Outpatient Encounter Prescriptions as of 03/18/2013  Medication Sig Dispense Refill  . albuterol (PROVENTIL) (2.5 MG/3ML) 0.083% nebulizer solution Take 3 mLs (2.5 mg total) by nebulization every 6 (six) hours as needed for wheezing. Please disp with nebulizer machine  150 mL  1  . ALPRAZolam (XANAX) 0.25 MG tablet Take 0.25 mg by mouth at bedtime as needed.      . Cholecalciferol (VITAMIN D-3) 1000 UNITS CAPS Take 1 capsule by mouth daily.      . fexofenadine (ALLEGRA) 180 MG tablet Take 180 mg by mouth daily.      . fluticasone (FLONASE) 50 MCG/ACT nasal spray Place 2 sprays into the nose daily.  48 g  3  . gabapentin (NEURONTIN) 300 MG capsule       . losartan (COZAAR) 50 MG tablet Take 1 tablet (50 mg total) by mouth daily.  30 tablet  3  . metoprolol succinate (TOPROL  XL) 25 MG 24 hr tablet Take 1 tablet (25 mg total) by mouth daily.  90 tablet  3  . Multiple Vitamin (MULTIVITAMIN) tablet Take 1 tablet by mouth daily.      Marland Kitchen omeprazole (PRILOSEC) 20 MG capsule Take 20 mg by mouth daily.      Marland Kitchen oxyCODONE-acetaminophen (PERCOCET) 10-325 MG per tablet Take 1 tablet by mouth every 4 (four) hours as needed for pain (Take one and a half tablet as needed for pain).      . simvastatin (ZOCOR) 10 MG tablet Take 0.5 tablets (5 mg total) by mouth at bedtime.  90 tablet  3  . traZODone (DESYREL) 50 MG tablet Take 1 tablet (50 mg total) by mouth at bedtime.  90 tablet  3  . vitamin B-12 (CYANOCOBALAMIN) 1000 MCG tablet Take 1,000 mcg by mouth daily.      Marland Kitchen levofloxacin (LEVAQUIN) 500 MG tablet Take 1 tablet (500 mg total) by mouth daily. Take 1 tablet daily for 10 days.  7 tablet  0  . naproxen (NAPROSYN) 500 MG tablet Take 1 tablet (500 mg total) by mouth 2 (two) times daily with a meal.  180 tablet  4  . predniSONE (STERAPRED UNI-PAK) 10 MG tablet Take 60mg  day 1 then taper by 10mg  daily  21 tablet  0  . traMADol (ULTRAM) 50 MG tablet Take 1 tablet (50 mg total) by mouth every 4 (four) hours.  180 tablet  3  . [DISCONTINUED] levofloxacin (LEVAQUIN) 500 MG tablet Take 1 tablet (500 mg total) by mouth daily. Take 1 tablet daily for 10 days.  7 tablet  0  . [DISCONTINUED] losartan-hydrochlorothiazide (HYZAAR) 50-12.5 MG per tablet Take 1 tablet by mouth daily.  90 tablet  3   No facility-administered encounter medications on file as of 03/18/2013.   BP 122/80  Pulse 92  Temp(Src) 99 F (37.2 C) (Oral)  Wt 221 lb (100.245 kg)  BMI 36.78 kg/m2  SpO2 95%  Review of Systems  Constitutional: Positive for fatigue. Negative for fever, chills, appetite change and unexpected weight change.  HENT: Positive for congestion, rhinorrhea and postnasal drip. Negative for ear pain, sore throat, trouble swallowing, neck pain, voice change and sinus pressure.   Eyes: Negative for visual  disturbance.  Respiratory: Positive for cough and shortness of breath. Negative for wheezing and stridor.   Cardiovascular: Negative for chest pain, palpitations and leg swelling.  Gastrointestinal: Negative for nausea, vomiting, abdominal pain, diarrhea, constipation, blood in stool, abdominal distention and anal bleeding.  Genitourinary: Negative for dysuria and flank pain.  Musculoskeletal: Positive for myalgias, arthralgias and gait problem.  Skin: Negative for color change and rash.  Neurological: Negative for dizziness and headaches.  Hematological: Negative for adenopathy. Does not bruise/bleed easily.  Psychiatric/Behavioral: Negative for suicidal ideas, sleep disturbance and dysphoric mood. The patient is not nervous/anxious.        Objective:   Physical Exam  Constitutional: She is oriented to person, place, and time. She appears well-developed and well-nourished. No distress.  HENT:  Head: Normocephalic and atraumatic.  Right Ear: External ear normal.  Left Ear: External ear normal.  Nose: Nose normal.  Mouth/Throat: Oropharynx is clear and moist. No oropharyngeal exudate.  Eyes: Conjunctivae are normal. Pupils are equal, round, and reactive to light. Right eye exhibits no discharge. Left eye exhibits no discharge. No scleral icterus.  Neck: Normal range of motion. Neck supple. No tracheal deviation present. No thyromegaly present.  Cardiovascular: Normal rate, regular rhythm, normal heart sounds and intact distal pulses.  Exam reveals no gallop and no friction rub.   No murmur heard. Pulmonary/Chest: Effort normal. No accessory muscle usage. Not tachypneic. No respiratory distress. She has no decreased breath sounds. She has wheezes. She has rhonchi. She has no rales. She exhibits no tenderness.  Musculoskeletal: Normal range of motion. She exhibits no edema and no tenderness.  Lymphadenopathy:    She has no cervical adenopathy.  Neurological: She is alert and oriented to  person, place, and time. No cranial nerve deficit. She exhibits normal muscle tone. Coordination normal.  Skin: Skin is warm and dry. No rash noted. She is not diaphoretic. No erythema. No pallor.  Psychiatric: She has a normal mood and affect. Her behavior is normal. Judgment and thought content normal. Her speech is slurred. Cognition and memory are normal.          Assessment & Plan:

## 2013-03-18 NOTE — Assessment & Plan Note (Signed)
Symptoms and exam consistent with acute bronchitis. Will treat with Levaquin and prednisone taper. Continue Albuterol. Pt was upset about being required to be seen prior to antibiotics. Discussed that this is [place] [facility] and our office policy. Follow up in 1 week or sooner if symptoms not improving.

## 2013-03-18 NOTE — Assessment & Plan Note (Signed)
Symptoms severe with low back pain radiating to left leg. Will continue prn Percocet. Follow up with neurosurgeon.

## 2013-03-19 ENCOUNTER — Other Ambulatory Visit: Payer: Self-pay | Admitting: Neurological Surgery

## 2013-03-24 ENCOUNTER — Encounter: Payer: Self-pay | Admitting: Internal Medicine

## 2013-03-24 ENCOUNTER — Telehealth: Payer: Self-pay | Admitting: Internal Medicine

## 2013-03-24 ENCOUNTER — Ambulatory Visit: Payer: Medicare Other | Admitting: Internal Medicine

## 2013-03-24 ENCOUNTER — Ambulatory Visit (INDEPENDENT_AMBULATORY_CARE_PROVIDER_SITE_OTHER): Payer: Medicare Other | Admitting: Internal Medicine

## 2013-03-24 VITALS — BP 120/70 | HR 63 | Temp 98.3°F | Wt 224.0 lb

## 2013-03-24 DIAGNOSIS — M549 Dorsalgia, unspecified: Secondary | ICD-10-CM

## 2013-03-24 DIAGNOSIS — J209 Acute bronchitis, unspecified: Secondary | ICD-10-CM

## 2013-03-24 DIAGNOSIS — G8929 Other chronic pain: Secondary | ICD-10-CM

## 2013-03-24 NOTE — Telephone Encounter (Signed)
Records requested from [facility] office. Jo-Ellen to fax over

## 2013-03-24 NOTE — Progress Notes (Signed)
Subjective:    Patient ID: Erin Camacho, female    DOB: 07-Jun-1946, 67 y.o.   MRN: [id number]  HPI 67YO female with chronic back pain scheduled for lumbar spine fusion presents for follow up after recent episode of bronchitis.  Bronchitis - Symptoms of cough, congestion have resolved. Denies fever, chills, chest pain. Completed course of Levaquin.  Chronic back pain - requests refill on Oxycodone-APAP 10-325mg  today. Taking 1.5 tablets 6 times per day. Reports this was what Dr. Yves Dill instructed, however Rx from him states 1 tablet q6hr as needed. Notes she is "feeling no pain" at this dose of medication. Her husband is concerned because she seems sedated and has slurred speech and confusion at times. Also taking Neurontin 600mg  tid, with some noted sedation.  Outpatient Encounter Prescriptions as of 03/24/2013  Medication Sig Dispense Refill  . albuterol (PROVENTIL) (2.5 MG/3ML) 0.083% nebulizer solution Take 3 mLs (2.5 mg total) by nebulization every 6 (six) hours as needed for wheezing. Please disp with nebulizer machine  150 mL  1  . ALPRAZolam (XANAX) 0.25 MG tablet Take 0.25 mg by mouth at bedtime as needed.      . Cholecalciferol (VITAMIN D-3) 1000 UNITS CAPS Take 1 capsule by mouth daily.      . fexofenadine (ALLEGRA) 180 MG tablet Take 180 mg by mouth daily.      . fluticasone (FLONASE) 50 MCG/ACT nasal spray Place 2 sprays into the nose daily.  48 g  3  . gabapentin (NEURONTIN) 300 MG capsule       . levofloxacin (LEVAQUIN) 500 MG tablet Take 1 tablet (500 mg total) by mouth daily. Take 1 tablet daily for 10 days.  7 tablet  0  . losartan (COZAAR) 50 MG tablet Take 1 tablet (50 mg total) by mouth daily.  30 tablet  3  . metoprolol succinate (TOPROL XL) 25 MG 24 hr tablet Take 1 tablet (25 mg total) by mouth daily.  90 tablet  3  . Multiple Vitamin (MULTIVITAMIN) tablet Take 1 tablet by mouth daily.      Marland Kitchen omeprazole (PRILOSEC) 20 MG capsule Take 20 mg by mouth daily.      Marland Kitchen  oxyCODONE-acetaminophen (PERCOCET) 10-325 MG per tablet Take 1 tablet by mouth every 4 (four) hours as needed for pain (Take one and a half tablet as needed for pain).      . simvastatin (ZOCOR) 10 MG tablet Take 0.5 tablets (5 mg total) by mouth at bedtime.  90 tablet  3  . traZODone (DESYREL) 50 MG tablet Take 1 tablet (50 mg total) by mouth at bedtime.  90 tablet  3  . vitamin B-12 (CYANOCOBALAMIN) 1000 MCG tablet Take 1,000 mcg by mouth daily.      . DULoxetine (CYMBALTA) 30 MG capsule       . HYDROcodone-acetaminophen (NORCO/VICODIN) 5-325 MG per tablet       . losartan-hydrochlorothiazide (HYZAAR) 50-12.5 MG per tablet       . naproxen (NAPROSYN) 500 MG tablet Take 1 tablet (500 mg total) by mouth 2 (two) times daily with a meal.  180 tablet  4  . traMADol (ULTRAM) 50 MG tablet Take 1 tablet (50 mg total) by mouth every 4 (four) hours.  180 tablet  3  . [DISCONTINUED] predniSONE (STERAPRED UNI-PAK) 10 MG tablet Take 60mg  day 1 then taper by 10mg  daily  21 tablet  0   No facility-administered encounter medications on file as of 03/24/2013.   BP 120/70  Pulse 63  Temp(Src) 98.3 F (36.8 C) (Oral)  Wt 224 lb (101.606 kg)  BMI 37.28 kg/m2  SpO2 97%  Review of Systems  Constitutional: Negative for fever, chills, appetite change, fatigue and unexpected weight change.  HENT: Negative for ear pain, congestion, sore throat, trouble swallowing, neck pain, voice change and sinus pressure.   Eyes: Negative for visual disturbance.  Respiratory: Negative for cough, shortness of breath, wheezing and stridor.   Cardiovascular: Negative for chest pain, palpitations and leg swelling.  Gastrointestinal: Negative for nausea, vomiting, abdominal pain, diarrhea, constipation, blood in stool, abdominal distention and anal bleeding.  Genitourinary: Negative for dysuria and flank pain.  Musculoskeletal: Positive for myalgias, back pain, arthralgias and gait problem.  Skin: Negative for color change and  rash.  Neurological: Negative for dizziness and headaches.  Hematological: Negative for adenopathy. Does not bruise/bleed easily.  Psychiatric/Behavioral: Negative for suicidal ideas, sleep disturbance and dysphoric mood. The patient is not nervous/anxious.        Objective:   Physical Exam  Constitutional: She is oriented to person, place, and time. She appears well-developed and well-nourished. No distress.  HENT:  Head: Normocephalic and atraumatic.  Right Ear: External ear normal.  Left Ear: External ear normal.  Nose: Nose normal.  Mouth/Throat: Oropharynx is clear and moist. No oropharyngeal exudate.  Eyes: Conjunctivae are normal. Pupils are equal, round, and reactive to light. Right eye exhibits no discharge. Left eye exhibits no discharge. No scleral icterus.  Neck: Normal range of motion. Neck supple. No tracheal deviation present. No thyromegaly present.  Cardiovascular: Normal rate, regular rhythm, normal heart sounds and intact distal pulses.  Exam reveals no gallop and no friction rub.   No murmur heard. Pulmonary/Chest: Effort normal and breath sounds normal. No respiratory distress. She has no wheezes. She has no rales. She exhibits no tenderness.  Musculoskeletal: Normal range of motion. She exhibits no edema and no tenderness.  Lymphadenopathy:    She has no cervical adenopathy.  Neurological: She is alert and oriented to person, place, and time. No cranial nerve deficit. She exhibits normal muscle tone. Coordination normal.  Skin: Skin is warm and dry. No rash noted. She is not diaphoretic. No erythema. No pallor.  Psychiatric: Judgment and thought content normal. Her mood appears anxious. Her speech is slurred. She is agitated.          Assessment & Plan:

## 2013-03-24 NOTE — Assessment & Plan Note (Signed)
Symptoms improved. Exam today normal. Will continue to monitor.

## 2013-03-24 NOTE — Assessment & Plan Note (Addendum)
Chronic back pain, scheduled for lumbar fusion 5/20. Pt has been taking Oxycodone-APAP 10-325mg , Rx from Dr. Yves Dill, 1.5 tablets 6x daily. On her last two visits she has been sedated with some confusion and slurred speech. Her husband voices concerns that she is overusing narcotic medication. She requests refill on Oxycodone today. Reviewed bottle from Dr. Yves Dill, which prescribed 1 tablet 4x daily. Discussed risks of excessive narcotics (90mg  oxycodone daily over last few weeks) and excessive tylenol (nearly 3000mg  daily from percocet alone). She is also taking Neurontin 600mg  tid which is sedating. Recommended limiting oxycodone to 10mg  three times daily. She does not feel that this will be adequate. She will follow up with Dr. Yves Dill today. She will likely need long acting pain medication and taper postoperatively, which we discussed may be done through the pain clinic at [facility].

## 2013-03-27 NOTE — Progress Notes (Signed)
Faxed via Epic

## 2013-03-29 ENCOUNTER — Encounter: Payer: Self-pay | Admitting: Internal Medicine

## 2013-04-03 ENCOUNTER — Encounter (HOSPITAL_COMMUNITY): Payer: Self-pay | Admitting: Pharmacy Technician

## 2013-04-06 ENCOUNTER — Ambulatory Visit (INDEPENDENT_AMBULATORY_CARE_PROVIDER_SITE_OTHER): Payer: Medicare Other | Admitting: Internal Medicine

## 2013-04-06 ENCOUNTER — Encounter: Payer: Self-pay | Admitting: Internal Medicine

## 2013-04-06 ENCOUNTER — Ambulatory Visit (INDEPENDENT_AMBULATORY_CARE_PROVIDER_SITE_OTHER)
Admission: RE | Admit: 2013-04-06 | Discharge: 2013-04-06 | Disposition: A | Discharge status: Home / Self Care | Payer: Medicare Other | Source: Ambulatory Visit | Attending: Internal Medicine | Admitting: Internal Medicine

## 2013-04-06 VITALS — BP 142/78 | HR 78 | Temp 98.7°F | Wt 226.0 lb

## 2013-04-06 DIAGNOSIS — J209 Acute bronchitis, unspecified: Secondary | ICD-10-CM

## 2013-04-06 MED ORDER — PREDNISONE (PAK) 10 MG PO TABS: ORAL_TABLET | ORAL | Status: DC

## 2013-04-06 MED ORDER — DOXYCYCLINE HYCLATE 100 MG PO TABS: 100.0000 mg | ORAL_TABLET | Freq: Two times a day (BID) | ORAL | Status: DC

## 2013-04-06 NOTE — Progress Notes (Signed)
Subjective:    Patient ID: Erin Camacho, female    DOB: [birth year], 67 y.o.   MRN: [id number]  HPI 67YO female presents for acute visit. Complains of approx 5 days of cough, wheezing, congestion, generalized malaise, fatigue, subjective fever and chills. No improvement with OTC medications or use of albuterol inhaled 1-2 times per day. Previous h/o frequent episodes of bronchitis. No smoking history. H/o previous seasonal allergies.  Outpatient Prescriptions Prior to Visit  Medication Sig Dispense Refill  . albuterol (PROVENTIL) (2.5 MG/3ML) 0.083% nebulizer solution Take 3 mLs (2.5 mg total) by nebulization every 6 (six) hours as needed for wheezing. Please disp with nebulizer machine  150 mL  1  . ALPRAZolam (XANAX) 0.25 MG tablet Take 0.25 mg by mouth at bedtime as needed for sleep.       . Cholecalciferol (VITAMIN D-3) 1000 UNITS CAPS Take 1 capsule by mouth daily.      . fexofenadine (ALLEGRA) 180 MG tablet Take 180 mg by mouth daily.      . fluticasone (FLONASE) 50 MCG/ACT nasal spray Place 2 sprays into the nose daily.  48 g  3  . gabapentin (NEURONTIN) 300 MG capsule Take 600 mg by mouth every 4 (four) hours.       Marland Kitchen losartan (COZAAR) 50 MG tablet Take 1 tablet (50 mg total) by mouth daily.  30 tablet  3  . metoprolol succinate (TOPROL XL) 25 MG 24 hr tablet Take 1 tablet (25 mg total) by mouth daily.  90 tablet  3  . Multiple Vitamin (MULTIVITAMIN) tablet Take 1 tablet by mouth daily.      Marland Kitchen omeprazole (PRILOSEC) 20 MG capsule Take 20 mg by mouth daily.      Marland Kitchen oxyCODONE-acetaminophen (PERCOCET) 10-325 MG per tablet Take 1 tablet by mouth every 4 (four) hours as needed for pain (Take one and a half tablet as needed for pain).      . simvastatin (ZOCOR) 10 MG tablet Take 0.5 tablets (5 mg total) by mouth at bedtime.  90 tablet  3  . traZODone (DESYREL) 50 MG tablet Take 1 tablet (50 mg total) by mouth at bedtime.  90 tablet  3  . vitamin B-12 (CYANOCOBALAMIN) 1000 MCG tablet Take 1,000 mcg  by mouth daily.       No facility-administered medications prior to visit.   BP 142/78  Pulse 78  Temp(Src) 98.7 F (37.1 C) (Oral)  Wt 226 lb (102.513 kg)  BMI 37.61 kg/m2  SpO2 95%  Review of Systems  Constitutional: Positive for fever and chills. Negative for unexpected weight change.  HENT: Positive for congestion, rhinorrhea and postnasal drip. Negative for hearing loss, ear pain, nosebleeds, sore throat, facial swelling, sneezing, mouth sores, trouble swallowing, neck pain, neck stiffness, voice change, sinus pressure, tinnitus and ear discharge.   Eyes: Negative for pain, discharge, redness and visual disturbance.  Respiratory: Positive for cough, shortness of breath and wheezing. Negative for chest tightness and stridor.   Cardiovascular: Negative for chest pain, palpitations and leg swelling.  Musculoskeletal: Negative for myalgias and arthralgias.  Skin: Negative for color change and rash.  Neurological: Negative for dizziness, weakness, light-headedness and headaches.  Hematological: Negative for adenopathy.       Objective:   Physical Exam  Constitutional: She is oriented to person, place, and time. She appears well-developed and well-nourished. No distress.  HENT:  Head: Normocephalic and atraumatic.  Right Ear: External ear normal.  Left Ear: External ear normal.  Nose: Nose normal.  Mouth/Throat: Oropharynx is clear and moist. No oropharyngeal exudate.  Eyes: Conjunctivae are normal. Pupils are equal, round, and reactive to light. Right eye exhibits no discharge. Left eye exhibits no discharge. No scleral icterus.  Neck: Normal range of motion. Neck supple. No tracheal deviation present. No thyromegaly present.  Cardiovascular: Normal rate, regular rhythm, normal heart sounds and intact distal pulses.  Exam reveals no gallop and no friction rub.   No murmur heard. Pulmonary/Chest: Effort normal. No accessory muscle usage. Not tachypneic. No respiratory distress.  She has no decreased breath sounds. She has wheezes. She has rhonchi. She has no rales. She exhibits no tenderness.  Musculoskeletal: Normal range of motion. She exhibits no edema and no tenderness.  Lymphadenopathy:    She has no cervical adenopathy.  Neurological: She is alert and oriented to person, place, and time. No cranial nerve deficit. She exhibits normal muscle tone. Coordination normal.  Skin: Skin is warm and dry. No rash noted. She is not diaphoretic. No erythema. No pallor.  Psychiatric: She has a normal mood and affect. Her behavior is normal. Judgment and thought content normal.          Assessment & Plan:

## 2013-04-06 NOTE — Assessment & Plan Note (Signed)
Symptoms and exam are consistent with acute bronchitis. Likely triggered by viral infection versus seasonal allergies. Will start Doxycycline and Prednisone taper. Will increase frequency of albuterol nebulizer treatments at home to 4x daily. Follow up in 5 days for recheck.

## 2013-04-09 ENCOUNTER — Encounter (HOSPITAL_COMMUNITY)
Admission: RE | Admit: 2013-04-09 | Discharge: 2013-04-09 | Disposition: A | Discharge status: Home / Self Care | Payer: Medicare Other | Source: Ambulatory Visit | Attending: Neurological Surgery | Admitting: Neurological Surgery

## 2013-04-09 ENCOUNTER — Encounter (HOSPITAL_COMMUNITY): Payer: Self-pay

## 2013-04-09 DIAGNOSIS — Z01812 Encounter for preprocedural laboratory examination: Secondary | ICD-10-CM | POA: Insufficient documentation

## 2013-04-09 DIAGNOSIS — Z01818 Encounter for other preprocedural examination: Secondary | ICD-10-CM | POA: Insufficient documentation

## 2013-04-09 HISTORY — DX: Pneumonia, unspecified organism: J18.9

## 2013-04-09 LAB — BASIC METABOLIC PANEL WITH GFR
BUN: 18 mg/dL (ref 6–23)
CO2: 24 meq/L (ref 19–32)
Calcium: 10 mg/dL (ref 8.4–10.5)
Chloride: 103 meq/L (ref 96–112)
Creatinine, Ser: 0.7 mg/dL (ref 0.50–1.10)
GFR calc Af Amer: >90 mL/min
GFR calc non Af Amer: 88 mL/min — ABNORMAL LOW
Glucose, Bld: 179 mg/dL — ABNORMAL HIGH (ref 70–99)
Potassium: 3.8 meq/L (ref 3.5–5.1)
Sodium: 141 meq/L (ref 135–145)

## 2013-04-09 LAB — TYPE AND SCREEN
ABO/RH(D): O POS
Antibody Screen: NEGATIVE

## 2013-04-09 LAB — CBC
HCT: 37.5 % (ref 36.0–46.0)
Hemoglobin: 12.1 g/dL (ref 12.0–15.0)
MCH: 29 pg (ref 26.0–34.0)
MCHC: 32.3 g/dL (ref 30.0–36.0)
MCV: 89.9 fL (ref 78.0–100.0)
Platelets: 191 10*3/uL (ref 150–400)
RBC: 4.17 MIL/uL (ref 3.87–5.11)
RDW: 14.7 % (ref 11.5–15.5)
WBC: 4.4 10*3/uL (ref 4.0–10.5)

## 2013-04-09 LAB — SURGICAL PCR SCREEN
MRSA, PCR: NEGATIVE
Staphylococcus aureus: NEGATIVE

## 2013-04-09 LAB — ABO/RH: ABO/RH(D): O POS

## 2013-04-09 NOTE — Pre-Procedure Instructions (Signed)
Erin Camacho  04/09/2013   Your procedure is scheduled on:  04/17/13  Report to [facility] at 530 AM.  Call this number if you have problems the morning of surgery: [phone]   Remember:   Do not eat food or drink liquids after midnight.   Take these medicines the morning of surgery with A SIP OF WATER: xanax,all inhalers,toprol,prilosec,percocet   Do not wear jewelry, make-up or nail polish.  Do not wear lotions, powders, or perfumes. You may wear deodorant.  Do not shave 48 hours prior to surgery. Men may shave face and neck.  Do not bring valuables to the hospital.  Contacts, dentures or bridgework may not be worn into surgery.  Leave suitcase in the car. After surgery it may be brought to your room.  For patients admitted to the hospital, checkout time is 11:00 AM the day of  discharge.   Patients discharged the day of surgery will not be allowed to drive  home.  Name and phone number of your driver: family  Special Instructions: Shower using CHG 2 nights before surgery and the night before surgery.  If you shower the day of surgery use CHG.  Use special wash - you have one bottle of CHG for all showers.  You should use approximately 1/3 of the bottle for each shower.   Please read over the following fact sheets that you were given: Pain Booklet, Coughing and Deep Breathing, Blood Transfusion Information, MRSA Information and Surgical Site Infection Prevention

## 2013-04-10 ENCOUNTER — Ambulatory Visit (INDEPENDENT_AMBULATORY_CARE_PROVIDER_SITE_OTHER): Payer: Medicare Other | Admitting: Internal Medicine

## 2013-04-10 ENCOUNTER — Encounter: Payer: Self-pay | Admitting: Internal Medicine

## 2013-04-10 VITALS — BP 160/90 | HR 69 | Temp 99.3°F | Wt 229.0 lb

## 2013-04-10 DIAGNOSIS — J209 Acute bronchitis, unspecified: Secondary | ICD-10-CM

## 2013-04-10 LAB — POCT URINALYSIS DIPSTICK
Bilirubin, UA: NEGATIVE
Blood, UA: NEGATIVE
Glucose, UA: NEGATIVE
Ketones, UA: NEGATIVE
Leukocytes, UA: NEGATIVE
Nitrite, UA: NEGATIVE
Protein, UA: NEGATIVE
Spec Grav, UA: 1.015
Urobilinogen, UA: 4
pH, UA: 7.5

## 2013-04-10 NOTE — Progress Notes (Signed)
Subjective:    Patient ID: Erin Camacho, female    DOB: 06/15/1946, 67 y.o.   MRN: [id number]  HPI 67YO female with recent h/o bronchitis presents for follow up. Started on Doxycycline and Prednisone earlier this week. CXR was normal. Reports improvement in symptoms. Less cough. No wheezing, dyspnea, chest pain, fever or chills noted. No new concerns.  Outpatient Encounter Prescriptions as of 04/10/2013  Medication Sig Dispense Refill  . albuterol (PROVENTIL) (2.5 MG/3ML) 0.083% nebulizer solution Take 3 mLs (2.5 mg total) by nebulization every 6 (six) hours as needed for wheezing. Please disp with nebulizer machine  150 mL  1  . ALPRAZolam (XANAX) 0.25 MG tablet Take 0.25 mg by mouth at bedtime as needed for sleep.       . Cholecalciferol (VITAMIN D-3) 1000 UNITS CAPS Take 1 capsule by mouth daily.      . fexofenadine (ALLEGRA) 180 MG tablet Take 180 mg by mouth daily.      . fluticasone (FLONASE) 50 MCG/ACT nasal spray Place 2 sprays into the nose daily.  48 g  3  . gabapentin (NEURONTIN) 300 MG capsule Take 600 mg by mouth every 4 (four) hours.       Marland Kitchen losartan (COZAAR) 50 MG tablet Take 1 tablet (50 mg total) by mouth daily.  30 tablet  3  . metoprolol succinate (TOPROL XL) 25 MG 24 hr tablet Take 1 tablet (25 mg total) by mouth daily.  90 tablet  3  . Multiple Vitamin (MULTIVITAMIN) tablet Take 1 tablet by mouth daily.      Marland Kitchen omeprazole (PRILOSEC) 20 MG capsule Take 20 mg by mouth daily.      Marland Kitchen oxyCODONE-acetaminophen (PERCOCET) 10-325 MG per tablet Take 1 tablet by mouth every 4 (four) hours as needed for pain (Take one and a half tablet as needed for pain).      . simvastatin (ZOCOR) 10 MG tablet Take 0.5 tablets (5 mg total) by mouth at bedtime.  90 tablet  3  . vitamin B-12 (CYANOCOBALAMIN) 1000 MCG tablet Take 1,000 mcg by mouth daily.      . [DISCONTINUED] doxycycline (VIBRA-TABS) 100 MG tablet Take 1 tablet (100 mg total) by mouth 2 (two) times daily.  20 tablet  0  . [DISCONTINUED]  predniSONE (STERAPRED UNI-PAK) 10 MG tablet Take 60mg  day 1 then taper by 10mg  daily  21 tablet  0  . doxycycline (VIBRAMYCIN) 100 MG capsule Take 100 mg by mouth 2 (two) times daily.      . traZODone (DESYREL) 50 MG tablet Take 1 tablet (50 mg total) by mouth at bedtime.  90 tablet  3   No facility-administered encounter medications on file as of 04/10/2013.   BP 160/90  Pulse 69  Temp(Src) 99.3 F (37.4 C) (Oral)  Wt 229 lb (103.874 kg)  BMI 38.11 kg/m2  SpO2 98%  Review of Systems  Constitutional: Negative for fever, chills and unexpected weight change.  HENT: Negative for hearing loss, ear pain, nosebleeds, congestion, sore throat, facial swelling, rhinorrhea, sneezing, mouth sores, trouble swallowing, neck pain, neck stiffness, voice change, postnasal drip, sinus pressure, tinnitus and ear discharge.   Eyes: Negative for pain, discharge, redness and visual disturbance.  Respiratory: Positive for cough. Negative for chest tightness, shortness of breath, wheezing and stridor.   Cardiovascular: Negative for chest pain, palpitations and leg swelling.  Musculoskeletal: Negative for myalgias and arthralgias.  Skin: Negative for color change and rash.  Neurological: Negative for dizziness, weakness, light-headedness and headaches.  Hematological: Negative for adenopathy.       Objective:   Physical Exam  Constitutional: She is oriented to person, place, and time. She appears well-developed and well-nourished. No distress.  HENT:  Head: Normocephalic and atraumatic.  Right Ear: External ear normal.  Left Ear: External ear normal.  Nose: Nose normal.  Mouth/Throat: Oropharynx is clear and moist. No oropharyngeal exudate.  Eyes: Conjunctivae are normal. Pupils are equal, round, and reactive to light. Right eye exhibits no discharge. Left eye exhibits no discharge. No scleral icterus.  Neck: Normal range of motion. Neck supple. No tracheal deviation present. No thyromegaly present.   Cardiovascular: Normal rate, regular rhythm, normal heart sounds and intact distal pulses.  Exam reveals no gallop and no friction rub.   No murmur heard. Pulmonary/Chest: Effort normal and breath sounds normal. No accessory muscle usage. Not tachypneic. No respiratory distress. She has no decreased breath sounds. She has no wheezes. She has no rhonchi. She has no rales. She exhibits no tenderness.  Musculoskeletal: Normal range of motion. She exhibits no edema and no tenderness.  Lymphadenopathy:    She has no cervical adenopathy.  Neurological: She is alert and oriented to person, place, and time. No cranial nerve deficit. She exhibits normal muscle tone. Coordination normal.  Skin: Skin is warm and dry. No rash noted. She is not diaphoretic. No erythema. No pallor.  Psychiatric: She has a normal mood and affect. Her behavior is normal. Judgment and thought content normal.          Assessment & Plan:

## 2013-04-10 NOTE — Assessment & Plan Note (Signed)
Symptoms improved with antibiotics and prednisone. CXR was normal. Will continue to monitor.

## 2013-04-16 MED ORDER — CEFAZOLIN SODIUM-DEXTROSE 2-3 GM-% IV SOLR
2.0000 g | INTRAVENOUS | Status: AC
  Administered 2013-04-17: 2 g via INTRAVENOUS
  Administered 2013-04-17: 1 g via INTRAVENOUS
  Filled 2013-04-16 (×2): qty 50

## 2013-04-16 NOTE — Preoperative (Addendum)
Beta Blockers   Toprol XL 25 mgs taken on 0445 04-17-13

## 2013-04-17 ENCOUNTER — Inpatient Hospital Stay (HOSPITAL_COMMUNITY): Payer: Medicare Other | Admitting: Anesthesiology

## 2013-04-17 ENCOUNTER — Inpatient Hospital Stay (HOSPITAL_COMMUNITY): Payer: Medicare Other

## 2013-04-17 ENCOUNTER — Encounter (HOSPITAL_COMMUNITY)
Admission: RE | Disposition: A | Discharge status: Skilled Nursing Facility | Payer: Self-pay | Source: Ambulatory Visit | Attending: Neurological Surgery

## 2013-04-17 ENCOUNTER — Encounter (HOSPITAL_COMMUNITY): Payer: Self-pay | Admitting: *Deleted

## 2013-04-17 ENCOUNTER — Encounter (HOSPITAL_COMMUNITY): Payer: Self-pay | Admitting: Anesthesiology

## 2013-04-17 ENCOUNTER — Inpatient Hospital Stay (HOSPITAL_COMMUNITY)
Admission: RE | Admit: 2013-04-17 | Discharge: 2013-04-20 | DRG: 459 | Disposition: A | Discharge status: Skilled Nursing Facility | Payer: Medicare Other | Source: Ambulatory Visit | Attending: Neurological Surgery | Admitting: Neurological Surgery

## 2013-04-17 DIAGNOSIS — R220 Localized swelling, mass and lump, head: Secondary | ICD-10-CM | POA: Diagnosis not present

## 2013-04-17 DIAGNOSIS — R221 Localized swelling, mass and lump, neck: Secondary | ICD-10-CM | POA: Diagnosis not present

## 2013-04-17 DIAGNOSIS — G8929 Other chronic pain: Secondary | ICD-10-CM

## 2013-04-17 DIAGNOSIS — E861 Hypovolemia: Secondary | ICD-10-CM | POA: Diagnosis not present

## 2013-04-17 DIAGNOSIS — Z9884 Bariatric surgery status: Secondary | ICD-10-CM

## 2013-04-17 DIAGNOSIS — R600 Localized edema: Secondary | ICD-10-CM | POA: Diagnosis present

## 2013-04-17 DIAGNOSIS — M4310 Spondylolisthesis, site unspecified: Principal | ICD-10-CM | POA: Diagnosis present

## 2013-04-17 DIAGNOSIS — J95821 Acute postprocedural respiratory failure: Secondary | ICD-10-CM | POA: Diagnosis present

## 2013-04-17 DIAGNOSIS — M549 Dorsalgia, unspecified: Secondary | ICD-10-CM

## 2013-04-17 DIAGNOSIS — E119 Type 2 diabetes mellitus without complications: Secondary | ICD-10-CM | POA: Diagnosis present

## 2013-04-17 DIAGNOSIS — Z96649 Presence of unspecified artificial hip joint: Secondary | ICD-10-CM

## 2013-04-17 DIAGNOSIS — R609 Edema, unspecified: Secondary | ICD-10-CM

## 2013-04-17 DIAGNOSIS — M47817 Spondylosis without myelopathy or radiculopathy, lumbosacral region: Secondary | ICD-10-CM | POA: Diagnosis present

## 2013-04-17 DIAGNOSIS — J209 Acute bronchitis, unspecified: Secondary | ICD-10-CM | POA: Diagnosis present

## 2013-04-17 DIAGNOSIS — Z6838 Body mass index (BMI) 38.0-38.9, adult: Secondary | ICD-10-CM

## 2013-04-17 DIAGNOSIS — E669 Obesity, unspecified: Secondary | ICD-10-CM | POA: Diagnosis present

## 2013-04-17 DIAGNOSIS — Z9889 Other specified postprocedural states: Secondary | ICD-10-CM

## 2013-04-17 DIAGNOSIS — K219 Gastro-esophageal reflux disease without esophagitis: Secondary | ICD-10-CM | POA: Diagnosis present

## 2013-04-17 DIAGNOSIS — F411 Generalized anxiety disorder: Secondary | ICD-10-CM | POA: Diagnosis present

## 2013-04-17 DIAGNOSIS — D62 Acute posthemorrhagic anemia: Secondary | ICD-10-CM | POA: Diagnosis not present

## 2013-04-17 DIAGNOSIS — Z01812 Encounter for preprocedural laboratory examination: Secondary | ICD-10-CM

## 2013-04-17 DIAGNOSIS — G8918 Other acute postprocedural pain: Secondary | ICD-10-CM

## 2013-04-17 DIAGNOSIS — I10 Essential (primary) hypertension: Secondary | ICD-10-CM | POA: Diagnosis present

## 2013-04-17 DIAGNOSIS — R350 Frequency of micturition: Secondary | ICD-10-CM | POA: Diagnosis not present

## 2013-04-17 LAB — CBC
HCT: 32.9 % — ABNORMAL LOW (ref 36.0–46.0)
Hemoglobin: 10.9 g/dL — ABNORMAL LOW (ref 12.0–15.0)
MCH: 29.3 pg (ref 26.0–34.0)
MCHC: 33.1 g/dL (ref 30.0–36.0)
MCV: 88.4 fL (ref 78.0–100.0)
Platelets: 176 10*3/uL (ref 150–400)
RBC: 3.72 MIL/uL — ABNORMAL LOW (ref 3.87–5.11)
RDW: 14 % (ref 11.5–15.5)
WBC: 10 10*3/uL (ref 4.0–10.5)

## 2013-04-17 LAB — BASIC METABOLIC PANEL WITH GFR
BUN: 15 mg/dL (ref 6–23)
CO2: 19 meq/L (ref 19–32)
Calcium: 9.2 mg/dL (ref 8.4–10.5)
Chloride: 106 meq/L (ref 96–112)
Creatinine, Ser: 0.64 mg/dL (ref 0.50–1.10)
GFR calc Af Amer: >90 mL/min
GFR calc non Af Amer: >90 mL/min
Glucose, Bld: 232 mg/dL — ABNORMAL HIGH (ref 70–99)
Potassium: 4.4 meq/L (ref 3.5–5.1)
Sodium: 140 meq/L (ref 135–145)

## 2013-04-17 LAB — GLUCOSE, CAPILLARY
Glucose-Capillary: 115 mg/dL — ABNORMAL HIGH (ref 70–99)
Glucose-Capillary: 179 mg/dL — ABNORMAL HIGH (ref 70–99)
Glucose-Capillary: 193 mg/dL — ABNORMAL HIGH (ref 70–99)
Glucose-Capillary: 198 mg/dL — ABNORMAL HIGH (ref 70–99)

## 2013-04-17 SURGERY — POSTERIOR LUMBAR FUSION 3 LEVEL
Anesthesia: General | Site: Back | Wound class: Clean

## 2013-04-17 MED ORDER — POLYETHYLENE GLYCOL 3350 17 G PO PACK
17.0000 g | PACK | Freq: Every day | ORAL | Status: DC | PRN
  Filled 2013-04-17 (×3): qty 1

## 2013-04-17 MED ORDER — PROPOFOL 10 MG/ML IV BOLUS
INTRAVENOUS | Status: DC | PRN
  Administered 2013-04-17: 170 mg via INTRAVENOUS
  Administered 2013-04-17: 60 mg via INTRAVENOUS
  Administered 2013-04-17: 150 mg via INTRAVENOUS

## 2013-04-17 MED ORDER — LORATADINE 10 MG PO TABS
10.0000 mg | ORAL_TABLET | Freq: Every day | ORAL | Status: DC
  Administered 2013-04-18 – 2013-04-20 (×3): 10 mg via ORAL
  Filled 2013-04-17 (×4): qty 1

## 2013-04-17 MED ORDER — MENTHOL 3 MG MT LOZG: 1.0000 | LOZENGE | OROMUCOSAL | Status: DC | PRN

## 2013-04-17 MED ORDER — ACETAMINOPHEN 650 MG RE SUPP: 650.0000 mg | RECTAL | Status: DC | PRN

## 2013-04-17 MED ORDER — SODIUM CHLORIDE 0.9 % IJ SOLN: 3.0000 mL | INTRAMUSCULAR | Status: DC | PRN

## 2013-04-17 MED ORDER — GABAPENTIN 300 MG PO CAPS
600.0000 mg | ORAL_CAPSULE | ORAL | Status: DC
  Administered 2013-04-19 – 2013-04-20 (×6): 600 mg via ORAL
  Filled 2013-04-17 (×22): qty 2

## 2013-04-17 MED ORDER — BISACODYL 10 MG RE SUPP: 10.0000 mg | Freq: Every day | RECTAL | Status: DC | PRN

## 2013-04-17 MED ORDER — SODIUM CHLORIDE 0.9 % IJ SOLN
3.0000 mL | Freq: Two times a day (BID) | INTRAMUSCULAR | Status: DC
  Administered 2013-04-17 – 2013-04-19 (×5): 3 mL via INTRAVENOUS

## 2013-04-17 MED ORDER — MIDAZOLAM HCL 5 MG/5ML IJ SOLN
INTRAMUSCULAR | Status: DC | PRN
  Administered 2013-04-17: 2 mg via INTRAVENOUS

## 2013-04-17 MED ORDER — GLYCOPYRROLATE 0.2 MG/ML IJ SOLN
INTRAMUSCULAR | Status: DC | PRN
  Administered 2013-04-17: 0.6 mg via INTRAVENOUS

## 2013-04-17 MED ORDER — ALBUTEROL SULFATE (5 MG/ML) 0.5% IN NEBU: 2.5000 mg | INHALATION_SOLUTION | RESPIRATORY_TRACT | Status: DC | PRN

## 2013-04-17 MED ORDER — PANTOPRAZOLE SODIUM 40 MG PO TBEC: 40.0000 mg | DELAYED_RELEASE_TABLET | Freq: Every day | ORAL | Status: DC

## 2013-04-17 MED ORDER — METHOCARBAMOL 500 MG PO TABS
500.0000 mg | ORAL_TABLET | Freq: Four times a day (QID) | ORAL | Status: DC | PRN
  Administered 2013-04-19 – 2013-04-20 (×3): 500 mg via ORAL
  Filled 2013-04-17 (×4): qty 1

## 2013-04-17 MED ORDER — METHOCARBAMOL 100 MG/ML IJ SOLN
500.0000 mg | Freq: Four times a day (QID) | INTRAVENOUS | Status: DC | PRN
  Filled 2013-04-17: qty 5

## 2013-04-17 MED ORDER — CHLORHEXIDINE GLUCONATE 0.12 % MT SOLN
15.0000 mL | Freq: Two times a day (BID) | OROMUCOSAL | Status: DC
  Administered 2013-04-17: 15 mL via OROMUCOSAL
  Filled 2013-04-17: qty 15

## 2013-04-17 MED ORDER — TRAZODONE HCL 50 MG PO TABS
50.0000 mg | ORAL_TABLET | Freq: Every day | ORAL | Status: DC
  Administered 2013-04-18 – 2013-04-19 (×2): 50 mg via ORAL
  Filled 2013-04-17 (×4): qty 1

## 2013-04-17 MED ORDER — SENNA 8.6 MG PO TABS
1.0000 | ORAL_TABLET | Freq: Two times a day (BID) | ORAL | Status: DC
  Administered 2013-04-18 – 2013-04-20 (×5): 8.6 mg via ORAL
  Filled 2013-04-17 (×7): qty 1

## 2013-04-17 MED ORDER — CEFAZOLIN SODIUM 1-5 GM-% IV SOLN
INTRAVENOUS | Status: AC
  Filled 2013-04-17: qty 50

## 2013-04-17 MED ORDER — ONDANSETRON HCL 4 MG/2ML IJ SOLN: 4.0000 mg | INTRAMUSCULAR | Status: DC | PRN

## 2013-04-17 MED ORDER — SUCCINYLCHOLINE CHLORIDE 20 MG/ML IJ SOLN
INTRAMUSCULAR | Status: DC | PRN
  Administered 2013-04-17: 100 mg via INTRAVENOUS

## 2013-04-17 MED ORDER — LOSARTAN POTASSIUM 50 MG PO TABS
50.0000 mg | ORAL_TABLET | Freq: Every day | ORAL | Status: DC
  Administered 2013-04-18 – 2013-04-20 (×3): 50 mg via ORAL
  Filled 2013-04-17 (×4): qty 1

## 2013-04-17 MED ORDER — MIDAZOLAM HCL 2 MG/2ML IJ SOLN: 2.0000 mg | INTRAMUSCULAR | Status: DC | PRN

## 2013-04-17 MED ORDER — LACTATED RINGERS IV SOLN
INTRAVENOUS | Status: DC | PRN
  Administered 2013-04-17 (×2): via INTRAVENOUS

## 2013-04-17 MED ORDER — ONDANSETRON HCL 4 MG/2ML IJ SOLN
INTRAMUSCULAR | Status: DC | PRN
  Administered 2013-04-17: 4 mg via INTRAVENOUS

## 2013-04-17 MED ORDER — ACETAMINOPHEN 325 MG PO TABS: 650.0000 mg | ORAL_TABLET | ORAL | Status: DC | PRN

## 2013-04-17 MED ORDER — FLUTICASONE PROPIONATE 50 MCG/ACT NA SUSP
2.0000 | Freq: Every day | NASAL | Status: DC
  Administered 2013-04-17 – 2013-04-20 (×4): 2 via NASAL
  Filled 2013-04-17: qty 16

## 2013-04-17 MED ORDER — 0.9 % SODIUM CHLORIDE (POUR BTL) OPTIME
TOPICAL | Status: DC | PRN
  Administered 2013-04-17: 1000 mL

## 2013-04-17 MED ORDER — CEFAZOLIN SODIUM-DEXTROSE 2-3 GM-% IV SOLR
INTRAVENOUS | Status: AC
  Filled 2013-04-17: qty 50

## 2013-04-17 MED ORDER — SODIUM CHLORIDE 0.9 % IV BOLUS (SEPSIS)
500.0000 mL | Freq: Once | INTRAVENOUS | Status: AC
  Administered 2013-04-17: 500 mL via INTRAVENOUS

## 2013-04-17 MED ORDER — BIOTENE DRY MOUTH MT LIQD: 15.0000 mL | Freq: Four times a day (QID) | OROMUCOSAL | Status: DC

## 2013-04-17 MED ORDER — FENTANYL CITRATE 0.05 MG/ML IJ SOLN
INTRAMUSCULAR | Status: AC
  Administered 2013-04-17: 50 ug
  Filled 2013-04-17: qty 2

## 2013-04-17 MED ORDER — ALBUMIN HUMAN 5 % IV SOLN
INTRAVENOUS | Status: DC | PRN
  Administered 2013-04-17 (×2): via INTRAVENOUS

## 2013-04-17 MED ORDER — FENTANYL CITRATE 0.05 MG/ML IJ SOLN
INTRAMUSCULAR | Status: AC
  Filled 2013-04-17: qty 2

## 2013-04-17 MED ORDER — DEXAMETHASONE SODIUM PHOSPHATE 4 MG/ML IJ SOLN
INTRAMUSCULAR | Status: DC | PRN
  Administered 2013-04-17: 8 mg via INTRAVENOUS

## 2013-04-17 MED ORDER — THROMBIN 20000 UNITS EX SOLR
CUTANEOUS | Status: DC | PRN
  Administered 2013-04-17: 09:00:00 via TOPICAL

## 2013-04-17 MED ORDER — DEXMEDETOMIDINE HCL IN NACL 200 MCG/50ML IV SOLN
0.2000 ug/kg/h | INTRAVENOUS | Status: DC
  Administered 2013-04-17 (×2): 0.2 ug/kg/h via INTRAVENOUS
  Filled 2013-04-17 (×2): qty 50

## 2013-04-17 MED ORDER — KCL IN DEXTROSE-NACL 20-5-0.45 MEQ/L-%-% IV SOLN
INTRAVENOUS | Status: DC
  Administered 2013-04-17: 17:00:00 via INTRAVENOUS
  Administered 2013-04-18: 1000 mL via INTRAVENOUS
  Filled 2013-04-17 (×7): qty 1000

## 2013-04-17 MED ORDER — INSULIN ASPART 100 UNIT/ML ~~LOC~~ SOLN
0.0000 [IU] | SUBCUTANEOUS | Status: DC
  Administered 2013-04-17 (×3): 3 [IU] via SUBCUTANEOUS
  Administered 2013-04-18 (×3): 2 [IU] via SUBCUTANEOUS
  Administered 2013-04-19: 3 [IU] via SUBCUTANEOUS
  Administered 2013-04-19: 2 [IU] via SUBCUTANEOUS

## 2013-04-17 MED ORDER — LACTATED RINGERS IV SOLN
INTRAVENOUS | Status: DC | PRN
  Administered 2013-04-17 (×3): via INTRAVENOUS

## 2013-04-17 MED ORDER — FENTANYL CITRATE 0.05 MG/ML IJ SOLN
INTRAMUSCULAR | Status: DC | PRN
  Administered 2013-04-17: 50 ug via INTRAVENOUS
  Administered 2013-04-17: 150 ug via INTRAVENOUS
  Administered 2013-04-17: 50 ug via INTRAVENOUS
  Administered 2013-04-17 (×2): 100 ug via INTRAVENOUS

## 2013-04-17 MED ORDER — DOCUSATE SODIUM 100 MG PO CAPS
100.0000 mg | ORAL_CAPSULE | Freq: Two times a day (BID) | ORAL | Status: DC
  Administered 2013-04-18 – 2013-04-20 (×5): 100 mg via ORAL
  Filled 2013-04-17 (×3): qty 1

## 2013-04-17 MED ORDER — SODIUM CHLORIDE 0.9 % IV SOLN: 250.0000 mL | INTRAVENOUS | Status: DC

## 2013-04-17 MED ORDER — ALPRAZOLAM 0.25 MG PO TABS
0.2500 mg | ORAL_TABLET | Freq: Every evening | ORAL | Status: DC | PRN
  Administered 2013-04-18: 0.25 mg via ORAL
  Filled 2013-04-17: qty 1

## 2013-04-17 MED ORDER — SODIUM CHLORIDE 0.9 % IV SOLN
INTRAVENOUS | Status: DC
  Administered 2013-04-17: 16:00:00 via INTRAVENOUS

## 2013-04-17 MED ORDER — OXYCODONE-ACETAMINOPHEN 5-325 MG PO TABS
1.0000 | ORAL_TABLET | ORAL | Status: DC | PRN
  Administered 2013-04-17: 1 via ORAL
  Administered 2013-04-18: 2 via ORAL
  Administered 2013-04-18 (×3): 1 via ORAL
  Administered 2013-04-18 – 2013-04-19 (×2): 2 via ORAL
  Administered 2013-04-19 (×2): 1 via ORAL
  Administered 2013-04-19 – 2013-04-20 (×3): 2 via ORAL
  Administered 2013-04-20: 1 via ORAL
  Filled 2013-04-17: qty 2
  Filled 2013-04-17 (×2): qty 1
  Filled 2013-04-17 (×2): qty 2
  Filled 2013-04-17 (×3): qty 1
  Filled 2013-04-17: qty 2
  Filled 2013-04-17: qty 1
  Filled 2013-04-17: qty 2
  Filled 2013-04-17: qty 1
  Filled 2013-04-17: qty 2

## 2013-04-17 MED ORDER — NEOSTIGMINE METHYLSULFATE 1 MG/ML IJ SOLN
INTRAMUSCULAR | Status: DC | PRN
  Administered 2013-04-17: 5 mg via INTRAVENOUS

## 2013-04-17 MED ORDER — BIOTENE DRY MOUTH MT LIQD
15.0000 mL | Freq: Two times a day (BID) | OROMUCOSAL | Status: DC
  Administered 2013-04-17: 15 mL via OROMUCOSAL

## 2013-04-17 MED ORDER — ALUM & MAG HYDROXIDE-SIMETH 200-200-20 MG/5ML PO SUSP: 30.0000 mL | Freq: Four times a day (QID) | ORAL | Status: DC | PRN

## 2013-04-17 MED ORDER — FENTANYL CITRATE 0.05 MG/ML IJ SOLN
50.0000 ug | INTRAMUSCULAR | Status: AC | PRN
  Administered 2013-04-17 (×2): 50 ug via INTRAVENOUS

## 2013-04-17 MED ORDER — LIDOCAINE-EPINEPHRINE 1 %-1:100000 IJ SOLN
INTRAMUSCULAR | Status: DC | PRN
  Administered 2013-04-17: 5 mL

## 2013-04-17 MED ORDER — SODIUM CHLORIDE 0.9 % IV SOLN
INTRAVENOUS | Status: AC
  Filled 2013-04-17: qty 500

## 2013-04-17 MED ORDER — LABETALOL HCL 5 MG/ML IV SOLN
INTRAVENOUS | Status: DC | PRN
  Administered 2013-04-17 (×4): 5 mg via INTRAVENOUS

## 2013-04-17 MED ORDER — ARTIFICIAL TEARS OP OINT
TOPICAL_OINTMENT | OPHTHALMIC | Status: DC | PRN
  Administered 2013-04-17: 1 "application " via OPHTHALMIC

## 2013-04-17 MED ORDER — HYDROMORPHONE HCL PF 1 MG/ML IJ SOLN
0.5000 mg | INTRAMUSCULAR | Status: DC | PRN
  Administered 2013-04-17 – 2013-04-18 (×7): 1 mg via INTRAVENOUS
  Filled 2013-04-17 (×7): qty 1

## 2013-04-17 MED ORDER — VECURONIUM BROMIDE 10 MG IV SOLR
INTRAVENOUS | Status: DC | PRN
  Administered 2013-04-17 (×2): 2 mg via INTRAVENOUS
  Administered 2013-04-17: 4 mg via INTRAVENOUS

## 2013-04-17 MED ORDER — SODIUM CHLORIDE 0.9 % IR SOLN
Status: DC | PRN
  Administered 2013-04-17: 09:00:00

## 2013-04-17 MED ORDER — PHENOL 1.4 % MT LIQD: 1.0000 | OROMUCOSAL | Status: DC | PRN

## 2013-04-17 MED ORDER — BUPIVACAINE HCL (PF) 0.5 % IJ SOLN
INTRAMUSCULAR | Status: DC | PRN
  Administered 2013-04-17: 5 mL

## 2013-04-17 MED ORDER — DEXMEDETOMIDINE BOLUS VIA INFUSION
1.0000 ug/kg | Freq: Once | INTRAVENOUS | Status: AC
  Administered 2013-04-17: 105.6 ug via INTRAVENOUS
  Filled 2013-04-17 (×2): qty 106

## 2013-04-17 MED ORDER — PROPOFOL 10 MG/ML IV EMUL
5.0000 ug/kg/min | INTRAVENOUS | Status: DC
  Filled 2013-04-17 (×4): qty 100

## 2013-04-17 MED ORDER — LIDOCAINE HCL (CARDIAC) 20 MG/ML IV SOLN
INTRAVENOUS | Status: DC | PRN
  Administered 2013-04-17: 80 mg via INTRAVENOUS

## 2013-04-17 MED ORDER — PANTOPRAZOLE SODIUM 40 MG IV SOLR
40.0000 mg | INTRAVENOUS | Status: DC
  Administered 2013-04-17 – 2013-04-18 (×2): 40 mg via INTRAVENOUS
  Filled 2013-04-17 (×3): qty 40

## 2013-04-17 MED ORDER — FLEET ENEMA 7-19 GM/118ML RE ENEM
1.0000 | ENEMA | Freq: Once | RECTAL | Status: AC | PRN
  Filled 2013-04-17: qty 1

## 2013-04-17 MED ORDER — SIMVASTATIN 5 MG PO TABS
5.0000 mg | ORAL_TABLET | Freq: Every day | ORAL | Status: DC
  Administered 2013-04-18 – 2013-04-19 (×2): 5 mg via ORAL
  Filled 2013-04-17 (×4): qty 1

## 2013-04-17 MED ORDER — HYDROMORPHONE HCL PF 1 MG/ML IJ SOLN: 0.2500 mg | INTRAMUSCULAR | Status: DC | PRN

## 2013-04-17 MED ORDER — FENTANYL CITRATE 0.05 MG/ML IJ SOLN: 25.0000 ug | INTRAMUSCULAR | Status: DC | PRN

## 2013-04-17 MED ORDER — DOXYCYCLINE HYCLATE 100 MG PO TABS
100.0000 mg | ORAL_TABLET | Freq: Two times a day (BID) | ORAL | Status: DC
  Filled 2013-04-17: qty 1

## 2013-04-17 MED ORDER — ROCURONIUM BROMIDE 100 MG/10ML IV SOLN
INTRAVENOUS | Status: DC | PRN
  Administered 2013-04-17: 50 mg via INTRAVENOUS

## 2013-04-17 MED ORDER — BACITRACIN 50000 UNITS IM SOLR
INTRAMUSCULAR | Status: AC
  Filled 2013-04-17: qty 1

## 2013-04-17 MED ORDER — SODIUM CHLORIDE 0.9 % IV SOLN
INTRAVENOUS | Status: DC | PRN
  Administered 2013-04-17 (×2): via INTRAVENOUS

## 2013-04-17 MED ORDER — CEFAZOLIN SODIUM 1-5 GM-% IV SOLN
1.0000 g | Freq: Three times a day (TID) | INTRAVENOUS | Status: AC
  Administered 2013-04-17 – 2013-04-18 (×2): 1 g via INTRAVENOUS
  Filled 2013-04-17 (×2): qty 50

## 2013-04-17 MED ORDER — METOPROLOL SUCCINATE ER 25 MG PO TB24
25.0000 mg | ORAL_TABLET | Freq: Every day | ORAL | Status: DC
  Administered 2013-04-18 – 2013-04-20 (×3): 25 mg via ORAL
  Filled 2013-04-17 (×3): qty 1

## 2013-04-17 SURGICAL SUPPLY — 65 items
BAG DECANTER FOR FLEXI CONT (MISCELLANEOUS) ×2
BLADE SURG ROTATE 9660 (MISCELLANEOUS)
BUR MATCHSTICK NEURO 3.0 LAGG (BURR) ×2
CAGE 11MM (Cage) ×4 IMPLANT
CANISTER SUCTION 2500CC (MISCELLANEOUS) ×2
CLOTH BEACON ORANGE TIMEOUT ST (SAFETY) ×2
CONT SPEC 4OZ CLIKSEAL STRL BL (MISCELLANEOUS) ×4
COVER BACK TABLE 24X17X13 BIG (DRAPES)
COVER TABLE BACK 60X90 (DRAPES) ×2
CROSSLINK MEDIUM (Cage) ×2 IMPLANT
DECANTER SPIKE VIAL GLASS SM (MISCELLANEOUS) ×2
DERMABOND ADHESIVE PROPEN (GAUZE/BANDAGES/DRESSINGS) ×1
DERMABOND ADVANCED (GAUZE/BANDAGES/DRESSINGS) ×1
DERMABOND ADVANCED .7 DNX12 (GAUZE/BANDAGES/DRESSINGS) ×1
DERMABOND ADVANCED .7 DNX6 (GAUZE/BANDAGES/DRESSINGS) ×1
DRAPE C-ARM 42X72 X-RAY (DRAPES) ×4
DRAPE LAPAROTOMY 100X72X124 (DRAPES) ×2
DRAPE POUCH INSTRU U-SHP 10X18 (DRAPES) ×2
DRAPE PROXIMA HALF (DRAPES) ×2
DURAPREP 26ML APPLICATOR (WOUND CARE) ×2
ELECT REM PT RETURN 9FT ADLT (ELECTROSURGICAL) ×2
GAUZE SPONGE 4X4 16PLY XRAY LF (GAUZE/BANDAGES/DRESSINGS) ×4
GLOVE BIOGEL PI IND STRL 8 (GLOVE) ×1
GLOVE BIOGEL PI IND STRL 8.5 (GLOVE) ×2
GLOVE BIOGEL PI INDICATOR 8 (GLOVE) ×1
GLOVE BIOGEL PI INDICATOR 8.5 (GLOVE) ×2
GLOVE ECLIPSE 7.5 STRL STRAW (GLOVE) ×2
GLOVE ECLIPSE 8.5 STRL (GLOVE) ×4
GLOVE EXAM NITRILE LRG STRL (GLOVE) ×2
GLOVE EXAM NITRILE MD LF STRL (GLOVE)
GLOVE EXAM NITRILE XL STR (GLOVE)
GLOVE EXAM NITRILE XS STR PU (GLOVE)
GLOVE INDICATOR 7.0 STRL GRN (GLOVE) ×6
GLOVE SURG SS PI 7.0 STRL IVOR (GLOVE) ×4
GOWN BRE IMP SLV AUR LG STRL (GOWN DISPOSABLE)
GOWN BRE IMP SLV AUR XL STRL (GOWN DISPOSABLE) ×6
GOWN STRL REIN 2XL LVL4 (GOWN DISPOSABLE) ×4
KIT BASIN OR (CUSTOM PROCEDURE TRAY) ×2
KIT ROOM TURNOVER OR (KITS) ×2
NEEDLE HYPO 22GX1.5 SAFETY (NEEDLE) ×2
NS IRRIG 1000ML POUR BTL (IV SOLUTION) ×2
PACK FOAM VITOSS 10CC (Orthopedic Implant) ×4 IMPLANT
PACK LAMINECTOMY NEURO (CUSTOM PROCEDURE TRAY) ×2
PAD ARMBOARD 7.5X6 YLW CONV (MISCELLANEOUS) ×6
PATTIES SURGICAL .5 X1 (DISPOSABLE) ×2
PATTIES SURGICAL 1X1 (DISPOSABLE) ×2
PEEK PLIF NOVEL 9X25X10 (Peek) ×4 IMPLANT
PEEK PLIF NOVEL 9X25X12 (Peek) ×4 IMPLANT
ROD 90MM (Rod) ×4 IMPLANT
SCREW 40MM (Screw) ×16 IMPLANT
SCREW SET SPINAL STD HEXALOBE (Screw) ×16 IMPLANT
SPONGE GAUZE 4X4 12PLY (GAUZE/BANDAGES/DRESSINGS) ×2
SPONGE LAP 4X18 X RAY DECT (DISPOSABLE) ×2
SPONGE SURGIFOAM ABS GEL 100 (HEMOSTASIS) ×2
SUT VIC AB 1 CT1 18XBRD ANBCTR (SUTURE) ×2
SUT VIC AB 1 CT1 8-18 (SUTURE) ×2
SUT VIC AB 2-0 CP2 18 (SUTURE) ×4
SUT VIC AB 3-0 SH 8-18 (SUTURE) ×4
SYR 20ML ECCENTRIC (SYRINGE) ×2
TAPE CLOTH SURG 4X10 WHT LF (GAUZE/BANDAGES/DRESSINGS) ×2
TOWEL OR 17X24 6PK STRL BLUE (TOWEL DISPOSABLE) ×2
TOWEL OR 17X26 10 PK STRL BLUE (TOWEL DISPOSABLE) ×2
TRAP SPECIMEN MUCOUS 40CC (MISCELLANEOUS) ×2
TRAY FOLEY CATH 14FRSI W/METER (CATHETERS) ×2
WATER STERILE IRR 1000ML POUR (IV SOLUTION) ×2

## 2013-04-17 NOTE — Anesthesia Procedure Notes (Signed)
Procedure Name: Intubation Date/Time: 04/17/2013 8:08 AM Performed by: Tyrone Nine Pre-anesthesia Checklist: Patient identified, Timeout performed, Emergency Drugs available, Suction available and Patient being monitored Patient Re-evaluated:Patient Re-evaluated prior to inductionOxygen Delivery Method: Circle system utilized Preoxygenation: Pre-oxygenation with 100% oxygen Intubation Type: IV induction, Rapid sequence and Cricoid Pressure applied Laryngoscope Size: Mac and 3 Grade View: Grade I Tube type: Oral Tube size: 7.5 mm Number of attempts: 1 Airway Equipment and Method: Stylet Secured at: 22 cm Tube secured with: Tape Dental Injury: Teeth and Oropharynx as per pre-operative assessment  Comments: Acid Reflux not controlled/RSI

## 2013-04-17 NOTE — Procedures (Signed)
Extubation Procedure Note  Patient Details:   Name: Erin Camacho DOB: Dec 11, 1945 MRN: [id number]   Airway Documentation:   Patient extubated to 4 lpm nasal cannula per MD order.  No weaning parameters obtained per order.  Patient able to breathe around deflated cuff and vocalize post procedure.  Tolerated well, no complications noted.   Evaluation  O2 sats: stable throughout Complications: No apparent complications Patient did tolerate procedure well. Bilateral Breath Sounds: Clear;Diminished Suctioning: Airway Yes  Stefani Baik, Aloha Gell 04/17/2013, 7:57 PM

## 2013-04-17 NOTE — Consult Note (Signed)
PULMONARY/CCM CONSULT NOTE  Requesting MD/Service: Elsner/NS Date of admission: 5/30 Date of consult: 5/30 Reason for consultation: Vent mgmt s/p prolonged back surgery   Pt Profile:  26 F underwent prolonged back surgery and remained intubated due to excessive facial swelling. PCCM asked ot assist with vent/CCM mgmt   HPI:  Level 5 caveat. Admission note and hospital records reviewed. Of note, just completed treatment for "bronchitis" with course of doxycycline and prednisone  Past Medical History  Diagnosis Date  . Arthritis   . GERD (gastroesophageal reflux disease)   . Allergy     hay fever  . Hypertension   . Hyperlipidemia   . UTI (lower urinary tract infection)   . Spinal stenosis   . Chronic back pain   . Pneumonia   . Diabetes mellitus     no meds      pt states she is not diabetic   Husband indicates h/o "borderline" DM Has undergone laparoscopic band bariatric surgery several years ago  MEDICATIONS: reviewed  History   Social History  . Marital Status: Married    Spouse Name: N/A    Number of Children: N/A  . Years of Education: N/A   Occupational History  . Not on file.   Social History Main Topics  . Smoking status: Never Smoker   . Smokeless tobacco: Never Used  . Alcohol Use: Yes     Comment: occ  . Drug Use: No  . Sexually Active: Not on file   Other Topics Concern  . Not on file   Social History Narrative   Lives in [place] with husband.      Work - retired Surveyor, minerals    Family History  Problem Relation Age of Onset  . Hypertension Mother   . Stroke Mother   . Heart disease Mother   . Hypertension Father   . Stroke Father   . Heart disease Father   . Cancer Other     breast  . Diabetes Other   . Arthritis Sister   . Diabetes Sister   . Diabetes Brother   . Diabetes Brother     ROS - level 5 caveat  Filed Vitals:   04/17/13 1533 04/17/13 1538 04/17/13 1553 04/17/13 1600  BP: 124/69  163/74 151/91  Pulse: 69   71 71  Temp:  97.2 F (36.2 C)  96.4 F (35.8 C)  TempSrc:    Axillary  Resp: 27  14 16   Height:   5\' 5"  (1.651 m)   Weight:   105.6 kg (232 lb 12.9 oz)   SpO2: 100%  100% 100%    EXAM:  Gen: WDWN, RASS -1. Nods to questions. + F/C HEENT: mild facial edema, otherwise WNL Neck: JVP not seen, No LAN Lungs: slightly coarse throughout. No wheezes Cardiovascular: RRR s M Abdomen: obese, soft, NT, NABS Ext: warm, no edema Neuro: MAEs, DTRs symmetric  DATA:     BMET    Component Value Date/Time   NA 141 04/09/2013 1030   K 3.8 04/09/2013 1030   CL 103 04/09/2013 1030   CO2 24 04/09/2013 1030   GLUCOSE 179* 04/09/2013 1030   BUN 18 04/09/2013 1030   CREATININE 0.70 04/09/2013 1030   CALCIUM 10.0 04/09/2013 1030   GFRNONAA 88* 04/09/2013 1030   GFRAA >90 04/09/2013 1030   CBC    Component Value Date/Time   WBC 4.4 04/09/2013 1030   RBC 4.17 04/09/2013 1030   HGB 12.1 04/09/2013 1030  HCT 37.5 04/09/2013 1030   PLT 191 04/09/2013 1030   MCV 89.9 04/09/2013 1030   MCH 29.0 04/09/2013 1030   MCHC 32.3 04/09/2013 1030   RDW 14.7 04/09/2013 1030    CXR pending  IMPRESSION:   Principal Problem:   Status post lumbar spine operation Active Problems:   Respiratory failure, post-operative  Due to facial edema after prolonged prone position intra-operatively   Post-op pain   Hypertension   Diabetes mellitus type 2, diet-controlled   Recent acute bronchitis   Obesity (BMI 30-39.9)   PLAN:  Vent settings established - PS mode for better synchrony DVTp (SCDs) and SUP (PPI) in place PRN allbuterol dexmedetomidine PRN hydromorphone SSI ordered No indication for further abx for bronchitis - D/C doxycycline Anticipate extubation 5/31 AM - WUA/SBT and extubation parameters ordered   Billy Fischer, MD ; [facility] service Mobile [phone].  After 5:30 PM or weekends, call [phone]

## 2013-04-17 NOTE — Progress Notes (Signed)
MD placed pt on these settings. MD okay to leave pt on CPAP overnight

## 2013-04-17 NOTE — H&P (Signed)
CHIEF COMPLAINT: Back pain, right leg pain and weakness with acute worsening in the past month.  HISTORY OF PRESENT ILLNESS: Erin Camacho is a 67 year old, right-handed individual whom I have seen before in 2003. At that time, I noted that she had degenerative changes at L4-5, and I recommended conservative treatment. She has been undergoing intermittent epidural steroid injections at [facility] and has been a patient of Dr. Yves Dill. She has done reasonably well until the recent past when she had an acute worsening with severe and unrelenting right leg pain to the point where she considered being seen in the emergency department. Dr. Yves Dill has just yesterday done an epidural steroid injection. This appointment had been arranged for further evaluation of this process, and today Erin Camacho presents to the office in a wheelchair. She notes that she can walk but short distances before her leg gives out completely. She feels that her strength is not quite what it should be, in that right leg particularly. She has not had any left leg symptoms. She has had a number of epidural steroid injections, and these have typically given her some modicum of relief. She notes that her injection previous to yesterday was on 04/01, and prior to that it was around the Christmas holiday. She notes that, at this point, she can barely tolerate the pain despite using strong narcotic medication in the form of Oxycodone 10/325. She is also taking gabapentin 300 mg three times a day.  PAST MEDICAL HISTORY: Significant for high blood pressure and some borderline diabetes. She has had a previous total hip replacement in 02/2012. She had a hernia surgery in 08/2006, and in 08/2003 she underwent lap band surgery. She has had a cholecystectomy sometime in the past.  PERSONAL HISTORY: She does not smoke nor does she use any alcohol.  REVIEW OF SYSTEMS: Notable for wearing of glasses, high blood pressure. She broke her right leg in 1996 and  her left leg in 1998. She has had back pain, leg pain, arthritis, difficulty with coordination particularly with the use of her right leg, some depression, and the questionable history of some borderline diabetes  CURRENT MEDICATIONS: Include multivitamin, B12, Allegra, Gabapentin, Oxycodone, Trazodone, Metoprolol, Losartan, Prilosec, Zocor, and Flonase.  PHYSICAL EXAMINATION: Height and weight have been stable at 5 feet 5 inches and 225 pounds. Her physical exam at this time reveals that she can walk, unassisted, but she maintains a 10-degree forward stoop. Standing straight and erect will tend to aggravate pain.  NEUROLOGICAL EXAMINATION: To confrontational testing, I note that there is weakness in both her iliopsoas and her quadriceps on the right side being graded at 4-/5. The tibialis anterior is also graded at 4/5. Gastrocnemius strength appears intact at 5/5. On the left side, her strength appears intact in all the major groups. Deep tendon reflexes are 3+ in the right patella, trace in the left patella, absent in both Achilles. Vibratory sensation is diminished on the left compared to the right side.  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 I note that the worst of the stenosis is at the lower two levels, but the third level has moderately severe stenosis particularly off to the right side which is the side of her greatest symptoms. In the office today, I obtained an AP and lateral radiograph of the  lumbar spine. This was to determine the coronal alignment as the MRI had no coronal images. Overall, she has good coronal alignment, although I will note that there is some preferential collapse of the right side of the disc space at L3-4 and the left side of the disc space at L2-3.  IMPRESSION AND  PLAN: In that regard, I have discussed surgical intervention for this process. I do not believe that further conservative management is going to yield her any improvement particularly in light of the weakness that she is experiencing. I noted to Erin Camacho and her husband that she would require decompression of L2, L3, and L4 with removal of the overgrown facet joints to decompress the posterior aspect of the canal, and performance of a complete discectomy at L2-3, L3-4, and L4-5. We would do a fusion across these levels, placing PEEK spacers into the interspace and bone graft that we harvest from the laminectomy into that space also. Then, we would stabilize the vertebrae by placing screws through the pedicles at L2 down to L5 with a rod on each side. Ultimately, this requires that [facility] heal this as a singular bone from L2 to L5, and this is a substantial undertaking. I indicated that the typical hospital stay is about five days, sometimes a bit longer depending on how the patient recovers. She notes that after her hip surgery, she did require some rehabilitation which was performed near her home at [facility]. We may be able to arrange this for her as the hospital stay progresses. The degree to which anyone needs rehabilitation is oftentimes determined by the patient herself, and certainly by the second or third day postoperatively we would have a good sense of whether she needs a short-term stay before she can become fully independent in her activities of daily living. After the surgery, patients typically require the use of an external corset for the first two months' time. Thereafter, we have them mobilize without the use of a corset. Hopefully, her strength should return so that her function can improve substantially. I note that she has had a very thorough course of conservative management to this point. I do not feel there is a role for physical therapy or other conservative measures. She has a high-grade  stenosis at the two levels involved, and these areas certainly need to be decompressed, but because of the degenerative spondylolisthesis they also need to be stabilized.

## 2013-04-17 NOTE — Progress Notes (Signed)
DR.HODIERNE AT BEDSIDE TO ASSESS PT. STATES THAT PT WILL NOT BE READY TO EXTUBATE FOR A FEW HOURS UNTIL SWELLING DIMINISHES. DIPRIVAN DRIP ORDERED FOR ICU AND OKAY TO MOVE PT AT THIS TIME. WILL CONT TO MONITOR.

## 2013-04-17 NOTE — Progress Notes (Signed)
Patient ID: Erin Camacho, female   DOB: Sep 28, 1946, 67 y.o.   MRN: [id number] Immediately post op patient had substantial facial and oral edema making extubation risky. Pt now alert oriented and ready for extubation. Dr. Vassie Loll aware and is weaning vent.  Motor function good dressing dry.

## 2013-04-17 NOTE — Transfer of Care (Signed)
Immediate Anesthesia Transfer of Care Note  Patient: Erin Camacho  Procedure(s) Performed: Procedure(s) with comments: Lumbar Two-Three, Lumbar Threee-Four,  Lumbar Four-Five Decompression/Fusion/Posterior lumbar interbody fusion/Pedicle screws (N/A) - POSTERIOR LUMBAR FUSION 3 LEVEL C-arm  Patient Location: PACU  Anesthesia Type:General  Level of Consciousness: Patient remains intubated per anesthesia plan  Airway & Oxygen Therapy: Patient remains intubated per anesthesia plan and Patient placed on Ventilator (see vital sign flow sheet for setting)  Post-op Assessment: Post -op Vital signs reviewed and stable  Post vital signs: Reviewed and stable  Complications: respiratory complications

## 2013-04-17 NOTE — Progress Notes (Signed)
eLink Physician-Brief Progress Note Patient Name: Erin Camacho DOB: 1946-04-19 MRN: [id number]  Date of Service  04/17/2013   HPI/Events of Note  Execllent Tv on PS 5/5 Facial edema resolved   eICU Interventions  Extubate Dc precedex   Intervention Category Major Interventions: Respiratory failure - evaluation and management  ALVA,RAKESH V. 04/17/2013, 7:49 PM

## 2013-04-17 NOTE — Progress Notes (Signed)
eLink Physician-Brief Progress Note Patient Name: Maryalyce Sanjuan DOB: 07/24/1946 MRN: [id number]  Date of Service  04/17/2013   HPI/Events of Note  Low BP after precedex bolus Likely hypovolemic post op   eICU Interventions  chk cbc, ABG (since  on PS) 500 cc bolus   Intervention Category Intermediate Interventions: Hypotension - evaluation and management  ALVA,RAKESH V. 04/17/2013, 6:40 PM

## 2013-04-17 NOTE — Anesthesia Preprocedure Evaluation (Addendum)
Anesthesia Evaluation  Patient identified by MRN, date of birth, ID band Patient awake    Reviewed: Allergy & Precautions, H&P , NPO status , Patient's Chart, lab work & pertinent test results  Airway Mallampati: II      Dental  (+) Teeth Intact and Dental Advisory Given   Pulmonary neg pneumonia -, Recent URI , Resolved,  04-06-13 Chest x-ray Recent Hx of bronchitis   Comparison: None.   Findings: Cardiomediastinal silhouette appears normal.  No acute pulmonary disease is noted.  Bony thorax is intact.   IMPRESSION: No acute cardiopulmonary abnormality seen.     breath sounds clear to auscultation        Cardiovascular Exercise Tolerance: Good hypertension, Pt. on medications and Pt. on home beta blockers Rhythm:Regular Rate:Normal  EKG Sinus Bradycardia -Anter. Fasicular block   Neuro/Psych Anxiety Pain Right leg only  Neuromuscular disease    GI/Hepatic Neg liver ROS, GERD-  Medicated and Poorly Controlled,History noted.   Endo/Other  diabetes, Well Controlled, Type 2Glucose 115 0n 04-17-13 @ 0559  Renal/GU negative Renal ROS     Musculoskeletal  (+) Arthritis -, Osteoarthritis,    Abdominal   Peds  Hematology negative hematology ROS (+)   Anesthesia Other Findings   Reproductive/Obstetrics negative OB ROS                      Anesthesia Physical Anesthesia Plan  ASA: III  Anesthesia Plan: General   Post-op Pain Management:    Induction: Intravenous  Airway Management Planned: Oral ETT  Additional Equipment:   Intra-op Plan:   Post-operative Plan: Extubation in OR  Informed Consent: I have reviewed the patients History and Physical, chart, labs and discussed the procedure including the risks, benefits and alternatives for the proposed anesthesia with the patient or authorized representative who has indicated his/her understanding and acceptance.   Dental advisory  given  Plan Discussed with: CRNA, Anesthesiologist and Surgeon  Anesthesia Plan Comments:         Anesthesia Quick Evaluation

## 2013-04-17 NOTE — Op Note (Signed)
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 Procedure: Lumbar laminectomy L2 L3-L4, partial L5 for decompression of L2 L3-L4 and L5 nerve roots with work greater than require for simple interbody arthrodesis, in posterior lumbar interbody arthrodesis with peek spacers local autograft and allograft segmental fixation L2-L5 with pedicle screws posterior lateral arthrodesis using local autograft and allograft L2-L5 placement of transverse connector  Surgeon: Barnett Abu Assistant: Shirlean Kelly Anesthesia: General endotracheal Indications: Patient is a 67 year old individual is had significant problems with back and bilateral lower chimney pain she has a severely deconditioned back and has developed severe spondylitic stenosis at L2-3-3 4 and 45. She also has some spondylosis at L1-L2 and L5-S1 however she has stenosis causing neurogenic claudication and radicular symptoms in lower extremities at these levels. She's been advised regarding the need for surgical decompression and stabilization. There is evidence of a degenerative spondylolisthesis most notably at L4-L5  Procedure: The patient was brought to the operating room supine on a stretcher after the smooth induction of general endotracheal anesthesia, she was turned prone. The back was shaved prepped with DuraPrep and alcohol and draped in a sterile fashion. A midline incision was created in the lower lumbar spine and this was carried down to the lumbar dorsal fascia which was exposed the length of the incision to expose the spinous processes of what was identified as L3 and L4 on a localizing radiograph. Then by dissecting superiorly to L2 I uncovered the  laminar arch and facet joint complex at L2-L3 and the pedicular arch and transverse process of L2 superiorly the dissection was carried into the transverse process and the dissection was then performed inferiorly to expose the laminar arch and transverse process of L5. The intertransverse spaces were then packed off for later grafting with autograft and allograft. Laminectomy was then performed removing the entire laminar arch of L3 and L4 and then undercutting L2 performing a complete facetectomy and partial laminectomy to expose the common dural tube and the take off of the L2 nerve root superiorly. The procedure was done bilaterally and then sequentially the L2 the L3 the L4 and the L5 nerve roots were used decompressed and mobilized the passage out to the foramen. There is significant stenosis in the lateral recess. At L4-5 there was some significant stenosis secondary to hypertrophied yellow ligament was causing a central canal stenosis. This was decompressed. The dissection was carried laterally and inferiorly and also superiorly. In the end each individual nerve root at L2 L3-L4 and L5 was decompressed and fully mobilized. Discectomies were then performed starting at L2-3 bilateral total discectomy was performed and the endplates were rongeured smooth to allow placement of an interbody spacer made of peak. At L2-3 10 mm spacers were chosen bone graft which was harvested from the laminectomy was mixed with the cost and packed into the interspace along with 210 mm spacers. At L3-4 11 mm spacers were used in a similar interbody arthrodesis was performed at L4-5 12 mm spacers were utilized with some restoration of lordosis at this level. Autograft and allograft was packed into the interspace also then with fluoroscopic guidance pedicle entry sites were chosen at L2 L3-L4 and L5.  At each level VI.5 x 40 mm screws were placed into their respective pedicles first by drilling a pilot hole when sounding with the  pedicle probe then by tapping each hole and again sounding with the pedicle probe to make sure there was no evidence of any cut out. 8 screws are paced placed in this manner from L2-L5. 90 mm precontoured rods were then placed between the saddles of the screws and this was tightened down in a neutral position. A transverse connector was applied between L3 and L4. Lateral gutters had been packed with the remainder of the autograft and allograft prior to placement of the pedicle screws. Final radiographs were obtained identifying good reduction of the spondylolisthesis with normal alignment in both the coronal and sagittal planes. After ear gaining the wound copiously with antibiotic irrigating solution and probed each of the individual nerve roots to make sure that they were patent and free and clear into travel out the foramen. There was inspected there is no dural openings. The lumbar dorsal fascia was then closed with #1 Vicryl in interrupted fashion 20 cc of half percent Marcaine was injected into the paraspinous fascia after disclosure 2-0 Vicryl is used to close the subcutaneous tissues and 3-0 Vicryl subcuticular skin. Blood loss was estimated at 600 cc and 200 cc of Cell Saver blood was returned to the patient.

## 2013-04-18 DIAGNOSIS — E669 Obesity, unspecified: Secondary | ICD-10-CM

## 2013-04-18 LAB — GLUCOSE, CAPILLARY
Glucose-Capillary: 120 mg/dL — ABNORMAL HIGH (ref 70–99)
Glucose-Capillary: 120 mg/dL — ABNORMAL HIGH (ref 70–99)
Glucose-Capillary: 124 mg/dL — ABNORMAL HIGH (ref 70–99)
Glucose-Capillary: 144 mg/dL — ABNORMAL HIGH (ref 70–99)
Glucose-Capillary: 174 mg/dL — ABNORMAL HIGH (ref 70–99)

## 2013-04-18 LAB — BASIC METABOLIC PANEL WITH GFR
BUN: 12 mg/dL (ref 6–23)
CO2: 27 meq/L (ref 19–32)
Calcium: 9.3 mg/dL (ref 8.4–10.5)
Chloride: 107 meq/L (ref 96–112)
Creatinine, Ser: 0.73 mg/dL (ref 0.50–1.10)
GFR calc Af Amer: >90 mL/min
GFR calc non Af Amer: 86 mL/min — ABNORMAL LOW
Glucose, Bld: 148 mg/dL — ABNORMAL HIGH (ref 70–99)
Potassium: 4.2 meq/L (ref 3.5–5.1)
Sodium: 144 meq/L (ref 135–145)

## 2013-04-18 LAB — CBC
HCT: 28.4 % — ABNORMAL LOW (ref 36.0–46.0)
Hemoglobin: 9.3 g/dL — ABNORMAL LOW (ref 12.0–15.0)
MCH: 29.1 pg (ref 26.0–34.0)
MCHC: 32.7 g/dL (ref 30.0–36.0)
MCV: 88.8 fL (ref 78.0–100.0)
Platelets: 180 10*3/uL (ref 150–400)
RBC: 3.2 MIL/uL — ABNORMAL LOW (ref 3.87–5.11)
RDW: 14.5 % (ref 11.5–15.5)
WBC: 9.2 10*3/uL (ref 4.0–10.5)

## 2013-04-18 MED ORDER — WHITE PETROLATUM GEL
Status: AC
  Filled 2013-04-18: qty 5

## 2013-04-18 NOTE — Progress Notes (Addendum)
PULMONARY/CCM CONSULT NOTE  Requesting MD/Service: Elsner/NS Date of admission: 5/30 Date of consult: 5/30 Reason for consultation: Vent mgmt s/p prolonged back surgery   Pt Profile:  72 F underwent prolonged back surgery and remained intubated due to excessive facial swelling. PCCM asked ot assist with vent/CCM mgmt  Subjective/Overnight:  Extubated yesterday evening.  Looks great. No c/o.  BP improved.   Filed Vitals:   04/18/13 0800 04/18/13 0900 04/18/13 1000 04/18/13 1014  BP: 130/62 112/47 124/55 124/55  Pulse: 94 83 84 94  Temp: 98.2 F (36.8 C)     TempSrc: Oral     Resp: 16 17 14    Height:      Weight:      SpO2: 99% 94% 98%     EXAM:  Gen: WDWN, pleasant, NAD HEENT: mild facial edema, otherwise WNL Lungs: resps even non labored, slightly coarse throughout. No wheezes Cardiovascular: RRR s M Abdomen: obese, soft, NT, NABS Ext: warm, no edema Neuro: MAEs, awake, alert   BMET    Component Value Date/Time   NA 144 04/18/2013 0600   K 4.2 04/18/2013 0600   CL 107 04/18/2013 0600   CO2 27 04/18/2013 0600   GLUCOSE 148* 04/18/2013 0600   BUN 12 04/18/2013 0600   CREATININE 0.73 04/18/2013 0600   CALCIUM 9.3 04/18/2013 0600   GFRNONAA 86* 04/18/2013 0600   GFRAA >90 04/18/2013 0600   CBC    Component Value Date/Time   WBC 9.2 04/18/2013 0600   RBC 3.20* 04/18/2013 0600   HGB 9.3* 04/18/2013 0600   HCT 28.4* 04/18/2013 0600   PLT 180 04/18/2013 0600   MCV 88.8 04/18/2013 0600   MCH 29.1 04/18/2013 0600   MCHC 32.7 04/18/2013 0600   RDW 14.5 04/18/2013 0600    Dg Lumbar Spine 2-3 Views  04/17/2013   *RADIOLOGY REPORT*  Clinical Data: L2-5 PLIF.  DG C-ARM 1-60 MIN, LUMBAR SPINE - 2-3 VIEW  Comparison: Plain films lumbar spine 03/12/2013.  Findings: We are provided with two fluoroscopic spot views of the lumbar spine.  Images demonstrate pedicle screws and stabilization bars from L2-L5.  Hardware appears intact.  No fracture.  IMPRESSION: L2-5 PLIF.   Original Report  Authenticated By: Holley Dexter, M.D.   Dg Lumbar Spine 1 View  04/17/2013   *RADIOLOGY REPORT*  Clinical Data: Level PLIF  LUMBAR SPINE - 1 VIEW  Comparison: Lumbar spine radiographs - 03/12/2013  Findings:  A single spot lateral radiographic images of the lumbar spine is provided for review. Examination is degraded secondary to underpenetration.  Lumbar spine labeling is in keeping with preprocedural lumbar spine radiographs.  Radiopaque surgical instruments are seen posterior to the L2 - L3 intervertebral disc space.  Additional radiopaque surgical support apparatus is seen about the operative site.  IMPRESSION: Intraoperative lumbar spine localization as above.   Original Report Authenticated By: Tacey Ruiz, MD   Dg Chest Port 1 View  04/17/2013   *RADIOLOGY REPORT*  Clinical Data: Intubated.  Postop.  Facial swelling.  PORTABLE CHEST - 1 VIEW  Comparison: 04/06/2013  Findings: Intubation.  Endotracheal tube 4.0 cm above carina. Numerous leads and wires project over the chest.  Cardiomegaly accentuated by AP portable technique.  No pleural effusion or pneumothorax.  Clear lungs.  IMPRESSION:  1.  Appropriate positioned endotracheal tube. 2. Cardiomegaly without congestive failure.   Original Report Authenticated By: Jeronimo Greaves, M.D.   Dg C-arm 1-60 Min  04/17/2013   *RADIOLOGY REPORT*  Clinical Data: L2-5 PLIF.  DG C-ARM 1-60 MIN, LUMBAR SPINE - 2-3 VIEW  Comparison: Plain films lumbar spine 03/12/2013.  Findings: We are provided with two fluoroscopic spot views of the lumbar spine.  Images demonstrate pedicle screws and stabilization bars from L2-L5.  Hardware appears intact.  No fracture.  IMPRESSION: L2-5 PLIF.   Original Report Authenticated By: Holley Dexter, M.D.     IMPRESSION:   Acute respiratory failure -- due to facial edema after prolonged prone positioning intra-operatively -- resolved. Recent bronchitis. Now extubated, doing well.   Hypotension - resolved  S/p lumbar spinal  sx  DM   PLAN:  pulm hygiene  Intermittent f/u CXR  PRN allbuterol SSI ordered No indication for further abx for bronchitis  Mobilize    Ok to tx out of ICU from [facility] standpoint    [facility], NP 04/18/2013  12:58 PM Pager: [phone] or [phone]  Transfer to floor, PCCM will sign off, please call back if needed.  *Care during the described time interval was provided by me and/or other providers on the critical care team. I have reviewed this patient's available data, including medical history, events of note, physical examination and test results as part of my evaluation.  Patient seen and examined, agree with above note.  I dictated the care and orders written for this patient under my direction.  Alyson Reedy, MD [phone]

## 2013-04-18 NOTE — Progress Notes (Signed)
Subjective: Patient reports Moderate back pain lower extremity feel better  Objective: Vital signs in last 24 hours: Temp:  [96.4 F (35.8 C)-99.3 F (37.4 C)] 98.2 F (36.8 C) (05/31 0800) Pulse Rate:  [60-95] 94 (05/31 0800) Resp:  [7-31] 16 (05/31 0800) BP: (82-163)/(44-113) 130/62 mmHg (05/31 0800) SpO2:  [98 %-100 %] 99 % (05/31 0800) FiO2 (%):  [35 %-60 %] 35 % (05/30 1924) Weight:  [103.9 kg (229 lb 0.9 oz)-105.6 kg (232 lb 12.9 oz)] 105.6 kg (232 lb 12.9 oz) (05/30 1553)  Intake/Output from previous day: 05/30 0701 - 05/31 0700 In: 7121.5 [P.O.:720; I.V.:5151.5; Blood:200; IV Piggyback:1050] Out: 3425 [Urine:2825; Blood:600] Intake/Output this shift: Total I/O In: 75 [I.V.:75] Out: -   Extubated alert feels well. No respiratory distress. He are to mobilize.  Lab Results:  Recent Labs  04/17/13 2030 04/18/13 0600  WBC 10.0 9.2  HGB 10.9* 9.3*  HCT 32.9* 28.4*  PLT 176 180   BMET  Recent Labs  04/17/13 2030 04/18/13 0600  NA 140 144  K 4.4 4.2  CL 106 107  CO2 19 27  GLUCOSE 232* 148*  BUN 15 12  CREATININE 0.64 0.73  CALCIUM 9.2 9.3    Studies/Results: Dg Lumbar Spine 2-3 Views  04/17/2013   *RADIOLOGY REPORT*  Clinical Data: L2-5 PLIF.  DG C-ARM 1-60 MIN, LUMBAR SPINE - 2-3 VIEW  Comparison: Plain films lumbar spine 03/12/2013.  Findings: We are provided with two fluoroscopic spot views of the lumbar spine.  Images demonstrate pedicle screws and stabilization bars from L2-L5.  Hardware appears intact.  No fracture.  IMPRESSION: L2-5 PLIF.   Original Report Authenticated By: Holley Dexter, M.D.   Dg Lumbar Spine 1 View  04/17/2013   *RADIOLOGY REPORT*  Clinical Data: Level PLIF  LUMBAR SPINE - 1 VIEW  Comparison: Lumbar spine radiographs - 03/12/2013  Findings:  A single spot lateral radiographic images of the lumbar spine is provided for review. Examination is degraded secondary to underpenetration.  Lumbar spine labeling is in keeping with  preprocedural lumbar spine radiographs.  Radiopaque surgical instruments are seen posterior to the L2 - L3 intervertebral disc space.  Additional radiopaque surgical support apparatus is seen about the operative site.  IMPRESSION: Intraoperative lumbar spine localization as above.   Original Report Authenticated By: Tacey Ruiz, MD   Dg Chest Port 1 View  04/17/2013   *RADIOLOGY REPORT*  Clinical Data: Intubated.  Postop.  Facial swelling.  PORTABLE CHEST - 1 VIEW  Comparison: 04/06/2013  Findings: Intubation.  Endotracheal tube 4.0 cm above carina. Numerous leads and wires project over the chest.  Cardiomegaly accentuated by AP portable technique.  No pleural effusion or pneumothorax.  Clear lungs.  IMPRESSION:  1.  Appropriate positioned endotracheal tube. 2. Cardiomegaly without congestive failure.   Original Report Authenticated By: Jeronimo Greaves, M.D.   Dg C-arm 1-60 Min  04/17/2013   *RADIOLOGY REPORT*  Clinical Data: L2-5 PLIF.  DG C-ARM 1-60 MIN, LUMBAR SPINE - 2-3 VIEW  Comparison: Plain films lumbar spine 03/12/2013.  Findings: We are provided with two fluoroscopic spot views of the lumbar spine.  Images demonstrate pedicle screws and stabilization bars from L2-L5.  Hardware appears intact.  No fracture.  IMPRESSION: L2-5 PLIF.   Original Report Authenticated By: Holley Dexter, M.D.    Assessment/Plan: Stable postop day 1. Hemoglobin dropped to 9.7 consistent with acute blood loss anemia from surgery  LOS: 1 day  Transferred to 4 N.   Erin Camacho J 04/18/2013, 8:53  AM     

## 2013-04-18 NOTE — Progress Notes (Signed)
Point of Contact CCM Note:  Patient seen and examined 2 hours post extubation.  Patient doing well, AAOx4, and tolerating clear liquids.  Patient without any hemodynamic issues or hypoxia post extubation.  No stridor or complaints of SOB.  Doing well.  Leontine Locket, MD [phone]

## 2013-04-18 NOTE — Evaluation (Signed)
Physical Therapy Evaluation Patient Details Name: Erin Camacho MRN: [id number] DOB: 1946/05/24 Today's Date: 04/18/2013 Time: 4098-1191 PT Time Calculation (min): 28 min  PT Assessment / Plan / Recommendation Clinical Impression  Pt is 67 y/o female admitted for s/p PLIF L3-5 with prolonged intubation post sx due to swelling. Pt moving well and will benefit from acute PT services to prepare for safe d/c to next venue.    PT Assessment  Patient needs continued PT services    Follow Up Recommendations  SNF (Pt requesting [facility])    Barriers to Discharge None      Equipment Recommendations  None recommended by PT    Frequency Min 5X/week    Precautions / Restrictions Precautions Precautions: Back Precaution Comments: Educated on 3/3 back precautions Required Braces or Orthoses: Spinal Brace Spinal Brace: Lumbar corset;Applied in sitting position Restrictions Weight Bearing Restrictions: No   Pertinent Vitals/Pain 10/10 back pain however lying supine without any distress      Mobility  Bed Mobility Bed Mobility: Rolling Left;Left Sidelying to Sit Rolling Left: 3: Mod assist;With rail Left Sidelying to Sit: 3: Mod assist;With rails;HOB flat Details for Bed Mobility Assistance: (A) to complete roll and (A) to elevate trunk OOB with cues for hand placement  Transfers Transfers: Sit to Stand;Stand to Sit Sit to Stand: 4: Min assist;From bed Stand to Sit: 4: Min assist;To chair/3-in-1 Details for Transfer Assistance: (A) to initiate transfer and cues for hand placement Ambulation/Gait Ambulation/Gait Assistance: 4: Min assist Ambulation Distance (Feet): 20 Feet Assistive device: Rolling walker Ambulation/Gait Assistance Details: (A) to maintain balance and manage RW with max cues for RW placement Gait Pattern: Step-through pattern;Decreased stride length;Shuffle Gait velocity: decreased Stairs: No Wheelchair Mobility Wheelchair Mobility: No    Exercises     PT  Diagnosis: Difficulty walking;Generalized weakness;Acute pain;Abnormality of gait  PT Problem List: Decreased strength;Decreased activity tolerance;Decreased balance;Decreased mobility;Decreased knowledge of use of DME;Decreased knowledge of precautions;Pain PT Treatment Interventions: DME instruction;Gait training;Functional mobility training;Therapeutic activities;Therapeutic exercise;Balance training;Patient/family education   PT Goals Acute Rehab PT Goals PT Goal Formulation: With patient Time For Goal Achievement: 04/25/13 Potential to Achieve Goals: Good Pt will Roll Supine to Right Side: with modified independence PT Goal: Rolling Supine to Right Side - Progress: Goal set today Pt will go Supine/Side to Sit: with modified independence PT Goal: Supine/Side to Sit - Progress: Goal set today Pt will go Sit to Supine/Side: with modified independence PT Goal: Sit to Supine/Side - Progress: Goal set today Pt will go Sit to Stand: with modified independence PT Goal: Sit to Stand - Progress: Goal set today Pt will go Stand to Sit: with modified independence PT Goal: Stand to Sit - Progress: Goal set today Pt will Ambulate: >150 feet;with modified independence;with least restrictive assistive device PT Goal: Ambulate - Progress: Goal set today  Visit Information  Last PT Received On: 04/18/13 Assistance Needed: +2 (lines)    Subjective Data  Subjective: "I'm just a little anxious about being in the ICU." Patient Stated Goal: To go to rehab then home with husband   Prior Functioning  Home Living Lives With: Spouse Available Help at Discharge: Family Type of Home: House Home Access: Stairs to enter Secretary/administrator of Steps: 5 Entrance Stairs-Rails: Left Home Layout: Multi-level Prior Function Level of Independence: Independent with assistive device(s) (needed cane vs RW depending on the day) Able to Take Stairs?: Yes Driving: Yes Communication Communication: No  difficulties Dominant Hand: Right    Cognition  Cognition Arousal/Alertness: Awake/alert Behavior  During Therapy: WFL for tasks assessed/performed Overall Cognitive Status: Within Functional Limits for tasks assessed    Extremity/Trunk Assessment Right Lower Extremity Assessment RLE ROM/Strength/Tone: Unable to fully assess;Due to pain RLE Sensation: WFL - Light Touch Left Lower Extremity Assessment LLE ROM/Strength/Tone: Unable to fully assess;Due to pain LLE Sensation: WFL - Light Touch   Balance Balance Balance Assessed: Yes Static Sitting Balance Static Sitting - Balance Support: Feet supported Static Sitting - Level of Assistance: 5: Stand by assistance  End of Session PT - End of Session Equipment Utilized During Treatment: Gait belt;Back brace Activity Tolerance: Patient tolerated treatment well Patient left: in chair;with call bell/phone within reach Nurse Communication: Mobility status  GP     Jessicamarie Amiri 04/18/2013, 1:05 PM Jake Shark, PT DPT [phone]

## 2013-04-18 NOTE — Plan of Care (Signed)
Problem: Consults Goal: Diagnosis - Spinal Surgery Outcome: Completed/Met Date Met:  04/18/13 Lumbar Laminectomy (Complex)

## 2013-04-19 LAB — URINALYSIS, ROUTINE W REFLEX MICROSCOPIC
Bilirubin Urine: NEGATIVE
Glucose, UA: NEGATIVE mg/dL
Hgb urine dipstick: NEGATIVE
Ketones, ur: NEGATIVE mg/dL
Leukocytes, UA: NEGATIVE
Nitrite: NEGATIVE
Protein, ur: NEGATIVE mg/dL
Specific Gravity, Urine: 1.011 (ref 1.005–1.030)
Urobilinogen, UA: 1 mg/dL (ref 0.0–1.0)
pH: 7.5 (ref 5.0–8.0)

## 2013-04-19 LAB — GLUCOSE, CAPILLARY
Glucose-Capillary: 116 mg/dL — ABNORMAL HIGH (ref 70–99)
Glucose-Capillary: 137 mg/dL — ABNORMAL HIGH (ref 70–99)
Glucose-Capillary: 150 mg/dL — ABNORMAL HIGH (ref 70–99)
Glucose-Capillary: 144 mg/dL — ABNORMAL HIGH (ref 70–99)
Glucose-Capillary: 123 mg/dL — ABNORMAL HIGH (ref 70–99)
Glucose-Capillary: 145 mg/dL — ABNORMAL HIGH (ref 70–99)

## 2013-04-19 MED ORDER — PANTOPRAZOLE SODIUM 40 MG PO TBEC
40.0000 mg | DELAYED_RELEASE_TABLET | Freq: Every day | ORAL | Status: DC
  Administered 2013-04-19 – 2013-04-20 (×2): 40 mg via ORAL
  Filled 2013-04-19 (×2): qty 1

## 2013-04-19 MED ORDER — INSULIN ASPART 100 UNIT/ML ~~LOC~~ SOLN
0.0000 [IU] | Freq: Three times a day (TID) | SUBCUTANEOUS | Status: DC
  Administered 2013-04-20: 2 [IU] via SUBCUTANEOUS

## 2013-04-19 MED ORDER — INSULIN ASPART 100 UNIT/ML ~~LOC~~ SOLN: 0.0000 [IU] | Freq: Every day | SUBCUTANEOUS | Status: DC

## 2013-04-19 NOTE — Progress Notes (Signed)
Filed Vitals:   04/18/13 1901 04/18/13 2200 04/19/13 0200 04/19/13 0600  BP: 149/65 140/59 164/70 142/64  Pulse: 90 89 94 63  Temp: 100.7 F (38.2 C) 99.8 F (37.7 C) 99.1 F (37.3 C) 98.7 F (37.1 C)  TempSrc: Oral     Resp: 18 16 16 16   Height:      Weight:      SpO2: 96% 96% 99% 98%    CBC  Recent Labs  04/17/13 2030 04/18/13 0600  WBC 10.0 9.2  HGB 10.9* 9.3*  HCT 32.9* 28.4*  PLT 176 180   BMET  Recent Labs  04/17/13 2030 04/18/13 0600  NA 140 144  K 4.4 4.2  CL 106 107  CO2 19 27  GLUCOSE 232* 148*  BUN 15 12  CREATININE 0.64 0.73  CALCIUM 9.2 9.3    Patient sitting up in chair, complaining of urinary frequency, MAXIMUM TEMPERATURE 100.7. Limited ambulation, so far. Seen by PT and OT.  Plan: We'll have nursing staff to in and out catheterization for UA and urine culture and sensitivity. Patient reports that she has no allergies to medications. Have encouraged patient to ambulate in the halls at least 4 times per day. Have asked PT and OT to see patient today.  Hewitt Shorts, MD 04/19/2013, 9:30 AM

## 2013-04-19 NOTE — Progress Notes (Signed)
Physical Therapy Treatment Patient Details Name: Erin Camacho MRN: [id number] DOB: Jun 26, 1946 Today's Date: 04/19/2013 Time: 7829-5621 PT Time Calculation (min): 20 min  PT Assessment / Plan / Recommendation Comments on Treatment Session  Pt continues to show progress and able to increase ambulation distance.  Pt request to go to [facility] at d/c for further therapy.  Pt continues to need (A) with all transfers with cues for hand placement and LE placement. Educated pt on getting up and walking with RN staff at least 2 times/day.      Follow Up Recommendations  SNF     Equipment Recommendations  None recommended by PT    Recommendations for Other Services    Frequency Min 5X/week   Plan Discharge plan remains appropriate;Frequency remains appropriate    Precautions / Restrictions Precautions Precautions: Back Precaution Comments: Educated on 3/3 back precautions Required Braces or Orthoses: Spinal Brace Spinal Brace: Lumbar corset;Applied in sitting position   Pertinent Vitals/Pain 6/10 back pain    Mobility  Bed Mobility Bed Mobility: Rolling Right;Right Sidelying to Sit;Sitting - Scoot to [place] of Bed Rolling Right: 4: Min assist;With rail Right Sidelying to Sit: 4: Min assist Sitting - Scoot to Edge of Bed: 4: Min guard Details for Bed Mobility Assistance: VCs for sequencing of log roll technique. Transfers Transfers: Sit to Stand;Stand to Sit Sit to Stand: 3: Mod assist;From bed;With upper extremity assist Stand to Sit: 4: Min assist;To chair/3-in-1;With armrests;With upper extremity assist Details for Transfer Assistance: Assist for power up from lower surface of bed and for steadying while pt transitioned UEs from bed to RW. VCs for sequencing. Ambulation/Gait Ambulation/Gait Assistance: 5: Supervision Ambulation Distance (Feet): 75 Feet Assistive device: Rolling walker Ambulation/Gait Assistance Details: Supervision for safety. Cues to relax shoulders and look straight  ahead. Gait Pattern: Step-through pattern;Decreased stride length;Shuffle Gait velocity: decreased Stairs: No    Exercises     PT Diagnosis:    PT Problem List:   PT Treatment Interventions:     PT Goals Acute Rehab PT Goals PT Goal Formulation: With patient Time For Goal Achievement: 04/25/13 Potential to Achieve Goals: Good Pt will Roll Supine to Right Side: with modified independence PT Goal: Rolling Supine to Right Side - Progress: Progressing toward goal Pt will go Supine/Side to Sit: with modified independence PT Goal: Supine/Side to Sit - Progress: Progressing toward goal Pt will go Sit to Stand: with modified independence PT Goal: Sit to Stand - Progress: Progressing toward goal Pt will go Stand to Sit: with modified independence PT Goal: Stand to Sit - Progress: Progressing toward goal Pt will Ambulate: >150 feet;with modified independence;with least restrictive assistive device PT Goal: Ambulate - Progress: Progressing toward goal  Visit Information  Last PT Received On: 04/19/13 Assistance Needed: +2 (safety and sit <> stand) PT/OT Co-Evaluation/Treatment: Yes    Subjective Data  Subjective: "How long do I have to sit up." Patient Stated Goal: To go to rehab then home with husband   Cognition  Cognition Arousal/Alertness: Awake/alert Behavior During Therapy: WFL for tasks assessed/performed Overall Cognitive Status: Within Functional Limits for tasks assessed    Balance     End of Session PT - End of Session Equipment Utilized During Treatment: Gait belt;Back brace Activity Tolerance: Patient tolerated treatment well Patient left: in chair;with call bell/phone within reach Nurse Communication: Mobility status   GP     Lewellyn Fultz 04/19/2013, 4:08 PM Jake Shark, PT DPT [phone]

## 2013-04-19 NOTE — Clinical Social Work Placement (Signed)
     Clinical Social Work Department CLINICAL SOCIAL WORK PLACEMENT NOTE 04/19/2013  Patient:  [facility]  Account Number:  [id number] Admit date:  04/17/2013  Clinical Social Worker:  Hulan Fray  Date/time:  04/19/2013 02:01 PM  Clinical Social Work is seeking post-discharge placement for this patient at the following level of care:   SKILLED NURSING   (*CSW will update this form in Epic as items are completed)   04/19/2013  Patient/family provided with [facility] Department of Clinical Social Works list of facilities offering this level of care within the geographic area requested by the patient (or if unable, by the patients family).  04/19/2013  Patient/family informed of their freedom to choose among providers that offer the needed level of care, that participate in Medicare, Medicaid or managed care program needed by the patient, have an available bed and are willing to accept the patient.  04/19/2013  Patient/family informed of [facility] ownership interest in [facility], as well as of the fact that they are under no obligation to receive care at this facility.  PASARR submitted to EDS on 04/19/2013 PASARR number received from EDS on   FL2 transmitted to all facilities in geographic area requested by pt/family on  04/19/2013 FL2 transmitted to all facilities within larger geographic area on   Patient informed that his/her managed care company has contracts with or will negotiate with  certain facilities, including the following:     Patient/family informed of bed offers received:   Patient chooses bed at  Physician recommends and patient chooses bed at    Patient to be transferred to  on   Patient to be transferred to facility by   The following physician request were entered in Epic:   Additional Comments:

## 2013-04-19 NOTE — Clinical Social Work Psychosocial (Signed)
     Clinical Social Work Department BRIEF PSYCHOSOCIAL ASSESSMENT 04/19/2013  Patient:  [facility]     Account Number:  [id number]     Admit date:  04/17/2013  Clinical Social Worker:  Hulan Fray  Date/Time:  04/19/2013 01:55 PM  Referred by:  Physician  Date Referred:  04/17/2013 Referred for  SNF Placement   Other Referral:   Interview type:  Patient Other interview type:    PSYCHOSOCIAL DATA Living Status:  HUSBAND Admitted from facility:   Level of care:   Primary support name:  Dione Housekeeper Primary support relationship to patient:  SPOUSE Degree of support available:   supportive    CURRENT CONCERNS Current Concerns  Post-Acute Placement   Other Concerns:    SOCIAL WORK ASSESSMENT / PLAN Clinical Social Worker received referral for SNF placement. CSW introduced self and explained reason for visit. Patient stated that she was interested in [facility], as she has been there previously. CSW provided SNF packet to patient and encouraged patient to think of a second choice as well. Patient voiced understanding. Patient is agreeable for CSW to initiate SNF search in [place].  Patients insurance requires prior authorization. CSW will submit clinicals to  [employer].    CSW will complete FL2 for MD's signature and weekday CSW will update patient when bed offers are made.   Assessment/plan status:  Psychosocial Support/Ongoing Assessment of Needs Other assessment/ plan:   Information/referral to community resources:   SNF packet    PATIENTS/FAMILYS RESPONSE TO PLAN OF CARE: Patient is agreeable for CSW to initate SNF search in [place], with a preference for [employer]. Patient was appreciative of CSW's visit and assistance with discharge planning.

## 2013-04-19 NOTE — Evaluation (Signed)
Occupational Therapy Evaluation Patient Details Name: Erin Camacho MRN: [id number] DOB: Jan 23, 1946 Today's Date: 04/19/2013 Time: 4098-1191 OT Time Calculation (min): 22 min  OT Assessment / Plan / Recommendation Clinical Impression  Pt admitted for s/p PLIF L3-5 with prolonged intubation post sx due to swelling thus affecting PLOF. Will continue to follow acutely in order to address below problem list.  Recommending SNF to progress rehab before returning home.    OT Assessment  Patient needs continued OT Services    Follow Up Recommendations  SNF    Barriers to Discharge      Equipment Recommendations  3 in 1 bedside comode    Recommendations for Other Services    Frequency  Min 2X/week    Precautions / Restrictions Precautions Precautions: Back Precaution Comments: Educated on 3/3 back precautions Required Braces or Orthoses: Spinal Brace Spinal Brace: Lumbar corset;Applied in sitting position   Pertinent Vitals/Pain See vitals    ADL  Upper Body Bathing: Simulated;Set up Where Assessed - Upper Body Bathing: Unsupported sitting Lower Body Bathing: Simulated;Moderate assistance Where Assessed - Lower Body Bathing: Supported sit to stand Upper Body Dressing: Performed;Minimal assistance Where Assessed - Upper Body Dressing: Unsupported sitting Lower Body Dressing: Simulated;Maximal assistance Where Assessed - Lower Body Dressing: Supported sit to stand Toilet Transfer: Simulated;Moderate assistance Toilet Transfer Method:  (ambulating) Toilet Transfer Equipment:  (bed) Equipment Used: Gait belt;Back brace;Rolling walker Transfers/Ambulation Related to ADLs: min guard ambulating with RW ADL Comments: Min assist to don back brace while sitting EOB.      OT Diagnosis: Generalized weakness;Acute pain  OT Problem List: Decreased strength;Decreased activity tolerance;Decreased knowledge of use of DME or AE;Decreased knowledge of precautions;Pain OT Treatment Interventions:  Self-care/ADL training;DME and/or AE instruction;Therapeutic activities;Patient/family education   OT Goals Acute Rehab OT Goals OT Goal Formulation: With patient Time For Goal Achievement: 04/26/13 Potential to Achieve Goals: Good ADL Goals Pt Will Perform Grooming: with supervision;Standing at sink ADL Goal: Grooming - Progress: Goal set today Pt Will Perform Lower Body Dressing: with min assist;Sit to stand from chair;Sit to stand from bed;with adaptive equipment ADL Goal: Lower Body Dressing - Progress: Goal set today Pt Will Transfer to Toilet: with supervision;Ambulation;with DME;Comfort height toilet;Maintaining back safety precautions ADL Goal: Toilet Transfer - Progress: Goal set today Pt Will Perform Toileting - Clothing Manipulation: with supervision;Standing ADL Goal: Toileting - Clothing Manipulation - Progress: Goal set today Pt Will Perform Toileting - Hygiene: with supervision;Sit to stand from 3-in-1/toilet;with adaptive equipment ADL Goal: Toileting - Hygiene - Progress: Goal set today Miscellaneous OT Goals Miscellaneous OT Goal #1: Pt will perform bed mobility at supervision level as precursor for EOB ADLs. OT Goal: Miscellaneous Goal #1 - Progress: Goal set today  Visit Information  Last OT Received On: 04/19/13    Subjective Data      Prior Functioning     Home Living Lives With: Spouse Available Help at Discharge: Family Type of Home: House Home Access: Stairs to enter [employer] of Steps: 5 Entrance Stairs-Rails: Left Additional Comments: Pt reports she is planning on going to [place] for rehab before returning home. Prior Function Level of Independence: Independent with assistive device(s) Able to Take Stairs?: Yes Driving: Yes Communication Communication: No difficulties Dominant Hand: Right         Vision/Perception     Cognition  Cognition Arousal/Alertness: Awake/alert Behavior During Therapy: WFL for tasks  assessed/performed Overall Cognitive Status: Within Functional Limits for tasks assessed    Extremity/Trunk Assessment Right Upper Extremity Assessment RUE  ROM/Strength/Tone: [facility] for tasks assessed Left Upper Extremity Assessment LUE ROM/Strength/Tone: [facility] for tasks assessed     Mobility Bed Mobility Bed Mobility: Rolling Right;Right Sidelying to Sit;Sitting - Scoot to [place] of Bed Rolling Right: 4: Min assist;With rail Right Sidelying to Sit: 4: Min assist Sitting - Scoot to Edge of Bed: 4: Min guard Details for Bed Mobility Assistance: VCs for sequencing of log roll technique. Transfers Transfers: Sit to Stand;Stand to Sit Sit to Stand: 3: Mod assist;From bed;With upper extremity assist Stand to Sit: 4: Min assist;To chair/3-in-1;With armrests;With upper extremity assist Details for Transfer Assistance: Assist for power up from lower surface of bed and for steadying while pt transitioned UEs from bed to RW. VCs for sequencing.     Exercise     Balance     End of Session OT - End of Session Equipment Utilized During Treatment: Back brace;Gait belt Activity Tolerance: Patient tolerated treatment well Patient left: in chair;with call bell/phone within reach;with family/visitor present Nurse Communication: Mobility status;Patient requests pain meds  GO    04/19/2013 Cipriano Mile OTR/L Pager [phone] Office [phone]  Cipriano Mile 04/19/2013, 3:59 PM

## 2013-04-20 ENCOUNTER — Encounter: Payer: Self-pay | Admitting: Internal Medicine

## 2013-04-20 LAB — GLUCOSE, CAPILLARY
Glucose-Capillary: 111 mg/dL — ABNORMAL HIGH (ref 70–99)
Glucose-Capillary: 121 mg/dL — ABNORMAL HIGH (ref 70–99)
Glucose-Capillary: 123 mg/dL — ABNORMAL HIGH (ref 70–99)

## 2013-04-20 LAB — URINE CULTURE
Colony Count: NO GROWTH
Culture: NO GROWTH
Special Requests: NORMAL

## 2013-04-20 MED ORDER — OXYCODONE-ACETAMINOPHEN 5-325 MG PO TABS: 1.0000 | ORAL_TABLET | ORAL | Status: DC | PRN

## 2013-04-20 MED ORDER — METHOCARBAMOL 500 MG PO TABS: 500.0000 mg | ORAL_TABLET | Freq: Four times a day (QID) | ORAL | Status: DC | PRN

## 2013-04-20 MED FILL — Heparin Sodium (Porcine) Inj 1000 Unit/ML: INTRAMUSCULAR | Qty: 30 | Status: AC

## 2013-04-20 MED FILL — Sodium Chloride Irrigation Soln 0.9%: Qty: 3000 | Status: AC

## 2013-04-20 MED FILL — Sodium Chloride IV Soln 0.9%: INTRAVENOUS | Qty: 1000 | Status: AC

## 2013-04-20 NOTE — Progress Notes (Signed)
Attempted calling for report on patient for several times but no avail.

## 2013-04-20 NOTE — Discharge Summary (Signed)
Physician Discharge Summary  Patient ID: Erin Camacho MRN: [id number] DOB/AGE: [birth year] 67 y.o.  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 She tolerated her surgery well. She will require a period of recuperation which is being planned at [facility] she is discharged there now.  Consults: None  Significant Diagnostic Studies: MRI lumbar  Treatments: Decompression L2-3 L3-4 L4-5 via laminectomy L2-L3 and L4 decompression of L2 L3-L4 and L5 nerve roots posterior lateral interbody arthrodesis using peek spacers local autograft and allograft L2-3 L3-4 L4-5 segmental fixation L2-L5 posterior lateral arthrodesis L2-L5.  Discharge Exam: Blood pressure 111/56, pulse 76, temperature 98.5 F (36.9 C), temperature source Oral, resp. rate 18, height 5\' 5"  (1.651 m), weight 105.6 kg (232 lb 12.9 oz), SpO2 100.00%. Incision is clean and dry motor function is intact in lower extremities with normal station and gait.  Disposition: He'll nursing facility, [facility] [place]  Discharge Orders   Future Appointments Provider Department Dept Phone   06/15/2013 2:15 PM Wynona Dove, MD [facility] PRIMARY CARE Nicholes Rough [phone]   Future Orders Complete By Expires     Diet - low sodium heart healthy  As directed     Increase activity  slowly  As directed         Medication List    TAKE these medications       albuterol (2.5 MG/3ML) 0.083% nebulizer solution  Commonly known as:  PROVENTIL  Take 3 mLs (2.5 mg total) by nebulization every 6 (six) hours as needed for wheezing. Please disp with nebulizer machine     ALPRAZolam 0.25 MG tablet  Commonly known as:  XANAX  Take 0.25 mg by mouth at bedtime as needed for sleep.     doxycycline 100 MG capsule  Commonly known as:  VIBRAMYCIN  Take 100 mg by mouth 2 (two) times daily.     fexofenadine 180 MG tablet  Commonly known as:  ALLEGRA  Take 180 mg by mouth daily.     fluticasone 50 MCG/ACT nasal spray  Commonly known as:  FLONASE  Place 2 sprays into the nose daily.     gabapentin 300 MG capsule  Commonly known as:  NEURONTIN  Take 600 mg by mouth every 4 (four) hours.     losartan 50 MG tablet  Commonly known as:  COZAAR  Take 1 tablet (50 mg total) by mouth daily.     methocarbamol 500 MG tablet  Commonly known as:  ROBAXIN  Take 1 tablet (500 mg total) by mouth every 6 (six) hours as needed.     metoprolol succinate 25 MG 24 hr tablet  Commonly known as:  TOPROL XL  Take 1 tablet (25 mg total) by mouth daily.     multivitamin tablet  Take 1 tablet by mouth daily.     omeprazole 20 MG capsule  Commonly known as:  PRILOSEC  Take 20 mg by mouth daily.  oxyCODONE-acetaminophen 5-325 MG per tablet  Commonly known as:  PERCOCET/ROXICET  Take 1-2 tablets by mouth every 4 (four) hours as needed for pain.     oxyCODONE-acetaminophen 10-325 MG per tablet  Commonly known as:  PERCOCET  Take 1 tablet by mouth every 4 (four) hours as needed for pain (Take one and a half tablet as needed for pain).     simvastatin 10 MG tablet  Commonly known as:  ZOCOR  Take 0.5 tablets (5 mg total) by mouth at bedtime.     traZODone 50 MG tablet  Commonly known as:  DESYREL  Take 1 tablet (50 mg total) by mouth at bedtime.     vitamin B-12 1000 MCG tablet   Commonly known as:  CYANOCOBALAMIN  Take 1,000 mcg by mouth daily.     Vitamin D-3 1000 UNITS Caps  Take 1 capsule by mouth daily.         SignedStefani Dama 04/20/2013, 12:57 PM

## 2013-04-20 NOTE — Progress Notes (Signed)
Physical Therapy Treatment Patient Details Name: Erin Camacho MRN: [id number] DOB: Dec 16, 1945 Today's Date: 04/20/2013 Time: 2725-3664 PT Time Calculation (min): 21 min  PT Assessment / Plan / Recommendation Comments on Treatment Session  Much improved today. Needs reminders for back precautions functionally but able to recall 3/3. Still needs RW due to weakness and pain in her back limiting her functional independence especially home alone. Still rec. SNF for continued therapies prior to d/c home with limited support as husband still works.     Follow Up Recommendations  SNF     Does the patient have the potential to tolerate intense rehabilitation     Barriers to Discharge        Equipment Recommendations  None recommended by PT    Recommendations for Other Services    Frequency Min 3X/week   Plan Discharge plan remains appropriate;Frequency needs to be updated    Precautions / Restrictions Precautions Precautions: Back Precaution Comments: Pt able to recite 3/3 back precautions Spinal Brace: Lumbar corset;Applied in sitting position Restrictions Other Position/Activity Restrictions: Reviewed how to don/doff back brace    Pertinent Vitals/Pain Reports minimal surgical pain, denies pain into her legs    Mobility  Bed Mobility Bed Mobility: Not assessed Transfers Transfers: Sit to Stand;Stand to Sit Sit to Stand: 4: Min guard Stand to Sit: 5: Supervision Details for Transfer Assistance: cues for safe hand placement and tall posture durign transfer Ambulation/Gait Ambulation/Gait Assistance: 5: Supervision Ambulation Distance (Feet): 150 Feet Assistive device: Rolling walker Ambulation/Gait Assistance Details: cues for tall posture and safe positioning with RW      PT Goals Acute Rehab PT Goals PT Goal: Sit to Stand - Progress: Progressing toward goal PT Goal: Stand to Sit - Progress: Progressing toward goal PT Goal: Ambulate - Progress: Progressing toward  goal  Visit Information  Last PT Received On: 04/20/13 Assistance Needed: +1    Subjective Data  Subjective: I want to be able to walk with my husband.  Patient Stated Goal: rehab and then home   Cognition  Cognition Arousal/Alertness: Awake/alert Behavior During Therapy: WFL for tasks assessed/performed Overall Cognitive Status: Within Functional Limits for tasks assessed    Balance     End of Session PT - End of Session Equipment Utilized During Treatment: Gait belt;Back brace Activity Tolerance: Patient tolerated treatment well Patient left: in chair;with call bell/phone within reach Nurse Communication: Mobility status   GP     [facility] HELEN 04/20/2013, 1:43 PM

## 2013-04-20 NOTE — Anesthesia Postprocedure Evaluation (Signed)
2 Anesthesia Post-op Note  Patient: Erin Camacho  Procedure(s) Performed: Procedure(s) with comments: Lumbar Two-Three, Lumbar Threee-Four,  Lumbar Four-Five Decompression/Fusion/Posterior lumbar interbody fusion/Pedicle screws (N/A) - POSTERIOR LUMBAR FUSION 3 LEVEL C-arm  Patient Location: Nursing Unit  Anesthesia Type:General  Level of Consciousness: awake, alert  and oriented  Airway and Oxygen Therapy: Patient Spontanous Breathing and Patient connected to nasal cannula oxygen  Post-op Pain: mild  Post-op Assessment: Post-op Vital signs reviewed, Patient's Cardiovascular Status Stable, Respiratory Function Stable, Patent Airway, No signs of Nausea or vomiting, Adequate PO intake and Pain level controlled  Post-op Vital Signs: Reviewed and stable  Complications: No apparent anesthesia complications

## 2013-04-20 NOTE — Progress Notes (Signed)
Orthopedic Tech Progress Note Patient Details:  Erin Camacho 04-03-1946 [id number]  Patient ID: [name], female   DOB: 15-Jan-1946, 67 y.o.   MRN: [id number] Pt already has brace ;pt stated that they picked it up from vendor ;rn notified   Nikki Dom 04/20/2013, 1:23 PM

## 2013-04-21 NOTE — Care Management Note (Signed)
    Page 1 of 1   04/21/2013     8:10:25 AM   CARE MANAGEMENT NOTE 04/21/2013  Patient:  [facility]   Account Number:  [id number]  Date Initiated:  04/21/2013  Documentation initiated by:  [facility]  Subjective/Objective Assessment:   admitted postop L2-5 PLIF     Action/Plan:   plan SNF   Anticipated DC Date:     Anticipated DC Plan:    In-house referral  Clinical Social Worker      DC Planning Services  CM consult      Choice offered to / List presented to:             Status of service:  Completed, signed off Medicare Important Message given?   (If response is "NO", the following Medicare IM given date fields will be blank) Date Medicare IM given:   Date Additional Medicare IM given:    Discharge Disposition:  SKILLED NURSING FACILITY  Per UR Regulation:  Reviewed for med. necessity/level of care/duration of stay  If discussed at Long Length of Stay Meetings, dates discussed:    Comments:

## 2013-05-04 ENCOUNTER — Ambulatory Visit: Payer: Medicare Other | Admitting: Internal Medicine

## 2013-05-14 ENCOUNTER — Ambulatory Visit (INDEPENDENT_AMBULATORY_CARE_PROVIDER_SITE_OTHER): Payer: Medicare Other | Admitting: Adult Health

## 2013-05-14 ENCOUNTER — Encounter: Payer: Self-pay | Admitting: Adult Health

## 2013-05-14 VITALS — BP 120/72 | HR 61 | Resp 12 | Wt 215.5 lb

## 2013-05-14 DIAGNOSIS — F3289 Other specified depressive episodes: Secondary | ICD-10-CM

## 2013-05-14 DIAGNOSIS — G4700 Insomnia, unspecified: Secondary | ICD-10-CM

## 2013-05-14 DIAGNOSIS — F329 Major depressive disorder, single episode, unspecified: Secondary | ICD-10-CM | POA: Insufficient documentation

## 2013-05-14 DIAGNOSIS — F32A Depression, unspecified: Secondary | ICD-10-CM

## 2013-05-14 DIAGNOSIS — F320 Major depressive disorder, single episode, mild: Secondary | ICD-10-CM | POA: Insufficient documentation

## 2013-05-14 MED ORDER — SERTRALINE HCL 50 MG PO TABS: 50.0000 mg | ORAL_TABLET | Freq: Every day | ORAL | Status: DC

## 2013-05-14 NOTE — Patient Instructions (Addendum)
  Please start Zoloft today.  You can increase the trazodone to 100 mg at bedtime to see if this helps you sleep better.  Return for follow up in 1-2 weeks.

## 2013-05-14 NOTE — Assessment & Plan Note (Signed)
Patient is currently on trazodone 50 mg at bedtime. Recently this has not been helping her. Rather than start new medication, discussed increasing dose of trazodone which patient was agreeable to. Will increase trazodone to 100 mg at bedtime as needed for sleep. Return in one to 2 weeks for followup

## 2013-05-14 NOTE — Progress Notes (Signed)
Subjective:    Patient ID: Erin Camacho, female    DOB: Apr 10, 1946, 67 y.o.   MRN: [id number]  HPI  Patient is a pleasant 67 year old female with history of hypertension, hyperlipidemia, diabetes, obesity, chronic back pain status post lumbar spine operation on 04/17/2013 with postoperative respiratory failure requiring intubation. Patient presents to clinic today with complaints of being depressed. She has been thinking about her experience during surgery and felt this was a very "traumatizing"event. Patient recalls waking up as they were trying to intubate her. She recalls the discomfort of the entire process. Patient reports frequent episodes of crying spells. She also reports weaning herself off of the pain medication and is wondering if some of her symptoms may be attributed to that. Erin Camacho is having problems sleeping. She takes trazodone 50 mg at bedtime but this, recently, has not been helping her. Pertaining to her recent surgery, she feels that she is improving daily. She is having physical therapy at home.   Current Outpatient Prescriptions on File Prior to Visit  Medication Sig Dispense Refill  . albuterol (PROVENTIL) (2.5 MG/3ML) 0.083% nebulizer solution Take 3 mLs (2.5 mg total) by nebulization every 6 (six) hours as needed for wheezing. Please disp with nebulizer machine  150 mL  1  . ALPRAZolam (XANAX) 0.25 MG tablet Take 0.25 mg by mouth at bedtime as needed for sleep.       . Cholecalciferol (VITAMIN D-3) 1000 UNITS CAPS Take 1 capsule by mouth daily.      . fexofenadine (ALLEGRA) 180 MG tablet Take 180 mg by mouth daily.      . fluticasone (FLONASE) 50 MCG/ACT nasal spray Place 2 sprays into the nose daily.  48 g  3  . metoprolol succinate (TOPROL XL) 25 MG 24 hr tablet Take 1 tablet (25 mg total) by mouth daily.  90 tablet  3  . Multiple Vitamin (MULTIVITAMIN) tablet Take 1 tablet by mouth daily.      Marland Kitchen omeprazole (PRILOSEC) 20 MG capsule Take 20 mg by mouth daily.      .  simvastatin (ZOCOR) 10 MG tablet Take 0.5 tablets (5 mg total) by mouth at bedtime.  90 tablet  3  . traZODone (DESYREL) 50 MG tablet Take 1 tablet (50 mg total) by mouth at bedtime.  90 tablet  3  . vitamin B-12 (CYANOCOBALAMIN) 1000 MCG tablet Take 1,000 mcg by mouth daily.      Marland Kitchen gabapentin (NEURONTIN) 300 MG capsule Take 600 mg by mouth every 4 (four) hours as needed.        No current facility-administered medications on file prior to visit.     Review of Systems  Constitutional: Negative for fever, chills and appetite change.  HENT: Negative.   Eyes: Negative.   Respiratory: Negative for cough, chest tightness, shortness of breath and wheezing.   Cardiovascular: Negative for chest pain and palpitations.  Gastrointestinal: Negative.   Genitourinary: Negative.   Musculoskeletal: Positive for back pain.       Patient is wearing a back brace. She is ambulatory and reports feeling greatly improved since surgery pertaining to her previous back pain. She is still having some problems with mobility but slowly recovering  Neurological: Negative.   Hematological: Negative.   Psychiatric/Behavioral: Positive for sleep disturbance. Negative for suicidal ideas, behavioral problems, confusion, self-injury and agitation. The patient is nervous/anxious.        Frequent episodes of crying     BP 120/72  Pulse 61  Resp 12  Wt  215 lb 8 oz (97.75 kg)  BMI 35.86 kg/m2  SpO2 99%    Objective:   Physical Exam  Constitutional: She is oriented to person, place, and time.  Overweight, female in no apparent physical distress. Patient appears emotionally distraught.  Cardiovascular: Normal rate, regular rhythm and normal heart sounds.  Exam reveals no gallop and no friction rub.   No murmur heard. Pulmonary/Chest: Effort normal and breath sounds normal. No respiratory distress. She has no wheezes. She has no rales.  Abdominal: Soft. Bowel sounds are normal.  Musculoskeletal:  Back brace in place.  Patient walks very guarded. Still having slight difficulty moving from a sitting to a standing position; however, she is able to do this on her own.  Neurological: She is alert and oriented to person, place, and time. Coordination normal.  Skin: Skin is warm and dry.  Psychiatric: Her behavior is normal. Judgment and thought content normal.  Patient cried at several intervals throughout clinic visit. She became very emotional talking about her surgical experience.       Assessment & Plan:

## 2013-05-14 NOTE — Assessment & Plan Note (Addendum)
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 Discussed starting medication to help somewhat with her symptoms. She has been on Zoloft in the past and felt that this was very beneficial. Discussed that she will not see the full benefits immediately but rather over a period of weeks. Specifically she was instructed that this medication would take anywhere between 4-8 weeks for full benefits. Patient was agreeable. We will start her on Zoloft 50 mg with goal of increasing dose within one week. I have asked her to return for followup in one to 2 weeks. Note, greater than 30 minutes were spent in face-to-face communication with the patient in allowing her time to express her concerns, in the assessment, planning and implementation of care pertaining to the problems during today's visit.

## 2013-05-25 ENCOUNTER — Ambulatory Visit (INDEPENDENT_AMBULATORY_CARE_PROVIDER_SITE_OTHER): Payer: Medicare Other | Admitting: Internal Medicine

## 2013-05-25 ENCOUNTER — Encounter: Payer: Self-pay | Admitting: Internal Medicine

## 2013-05-25 VITALS — BP 112/68 | HR 68 | Temp 98.2°F | Wt 201.0 lb

## 2013-05-25 DIAGNOSIS — F3289 Other specified depressive episodes: Secondary | ICD-10-CM

## 2013-05-25 DIAGNOSIS — M961 Postlaminectomy syndrome, not elsewhere classified: Secondary | ICD-10-CM | POA: Insufficient documentation

## 2013-05-25 DIAGNOSIS — F329 Major depressive disorder, single episode, unspecified: Secondary | ICD-10-CM

## 2013-05-25 DIAGNOSIS — M549 Dorsalgia, unspecified: Secondary | ICD-10-CM

## 2013-05-25 DIAGNOSIS — F32A Depression, unspecified: Secondary | ICD-10-CM

## 2013-05-25 MED ORDER — CYCLOBENZAPRINE HCL 10 MG PO TABS: 10.0000 mg | ORAL_TABLET | Freq: Three times a day (TID) | ORAL | Status: DC | PRN

## 2013-05-25 MED ORDER — LIDOCAINE 5 % EX PTCH: 1.0000 | MEDICATED_PATCH | CUTANEOUS | Status: DC

## 2013-05-25 NOTE — Assessment & Plan Note (Signed)
Symptoms improved with use of Sertraline. Will continue to monitor.

## 2013-05-25 NOTE — Patient Instructions (Signed)
Call with update on Wednesday or sooner if needed.

## 2013-05-25 NOTE — Assessment & Plan Note (Signed)
Acute on chronic back pain with muscular spasm. Will add Flexeril 10 mg up to 3 times daily, start Lidoderm patch, and continue tramadol. Patient will call with an update in 2 days. We discussed the symptoms are not improving, she should followup with her surgeon who recently performed lumbar spine surgery.

## 2013-05-25 NOTE — Progress Notes (Signed)
Subjective:    Patient ID: Erin Camacho, female    DOB: 1946/09/20, 67 y.o.   MRN: [id number]  HPI 67 year old female with history of chronic back pain, diabetes, hypertension, hyperlipidemia presents for followup. At her last visit, she complained of worsening symptoms of depression. She was started on sertraline. She reports that symptoms are improved. She denies any side effects from the medication.  In regards to chronic back pain, symptoms were much improved after recent lumbar laminectomy. However, she recently had a twisting injury to her back when seated at a restaurant, and reports muscle spasm in her mid back after this event. She has been taking tramadol with minimal improvement. She has been applying heat packs to the area. Pain is described as cramping it does not radiate. She has no pain over her recent surgical site.  Outpatient Encounter Prescriptions as of 05/25/2013  Medication Sig Dispense Refill  . albuterol (PROVENTIL) (2.5 MG/3ML) 0.083% nebulizer solution Take 3 mLs (2.5 mg total) by nebulization every 6 (six) hours as needed for wheezing. Please disp with nebulizer machine  150 mL  1  . ALPRAZolam (XANAX) 0.25 MG tablet Take 0.25 mg by mouth at bedtime as needed for sleep.       . Cholecalciferol (VITAMIN D-3) 1000 UNITS CAPS Take 1 capsule by mouth daily.      . cyclobenzaprine (FLEXERIL) 10 MG tablet Take 1 tablet (10 mg total) by mouth 3 (three) times daily as needed for muscle spasms.  90 tablet  3  . fexofenadine (ALLEGRA) 180 MG tablet Take 180 mg by mouth daily.      . fluticasone (FLONASE) 50 MCG/ACT nasal spray Place 2 sprays into the nose daily.  48 g  3  . losartan-hydrochlorothiazide (HYZAAR) 50-12.5 MG per tablet Take 1 tablet by mouth daily.      . metoprolol succinate (TOPROL XL) 25 MG 24 hr tablet Take 1 tablet (25 mg total) by mouth daily.  90 tablet  3  . Multiple Vitamin (MULTIVITAMIN) tablet Take 1 tablet by mouth daily.      Marland Kitchen omeprazole (PRILOSEC) 20 MG  capsule Take 20 mg by mouth daily.      . sertraline (ZOLOFT) 50 MG tablet Take 1 tablet (50 mg total) by mouth daily.  30 tablet  3  . simvastatin (ZOCOR) 10 MG tablet Take 0.5 tablets (5 mg total) by mouth at bedtime.  90 tablet  3  . traMADol (ULTRAM) 50 MG tablet Take 50 mg by mouth every 6 (six) hours as needed for pain.      . traZODone (DESYREL) 50 MG tablet Take 1 tablet (50 mg total) by mouth at bedtime.  90 tablet  3  . vitamin B-12 (CYANOCOBALAMIN) 1000 MCG tablet Take 1,000 mcg by mouth daily.      . [DISCONTINUED] cyclobenzaprine (FLEXERIL) 10 MG tablet Take 10 mg by mouth 3 (three) times daily as needed for muscle spasms.      Marland Kitchen gabapentin (NEURONTIN) 300 MG capsule Take 600 mg by mouth every 4 (four) hours as needed.       . lidocaine (LIDODERM) 5 % Place 1 patch onto the skin daily. Remove & Discard patch within 12 hours or as directed by MD  30 patch  0  . [DISCONTINUED] oxyCODONE-acetaminophen (PERCOCET/ROXICET) 5-325 MG per tablet        No facility-administered encounter medications on file as of 05/25/2013.   BP 112/68  Pulse 68  Temp(Src) 98.2 F (36.8 C) (Oral)  Wt  201 lb (91.173 kg)  BMI 33.45 kg/m2  SpO2 98%  Review of Systems  Constitutional: Negative for fever, chills, appetite change, fatigue and unexpected weight change.  HENT: Negative for ear pain, congestion, sore throat, trouble swallowing, neck pain, voice change and sinus pressure.   Eyes: Negative for visual disturbance.  Respiratory: Negative for cough, shortness of breath, wheezing and stridor.   Cardiovascular: Negative for chest pain, palpitations and leg swelling.  Gastrointestinal: Negative for nausea, vomiting, abdominal pain, diarrhea, constipation, blood in stool, abdominal distention and anal bleeding.  Genitourinary: Negative for dysuria and flank pain.  Musculoskeletal: Positive for myalgias and back pain. Negative for arthralgias and gait problem.  Skin: Negative for color change and rash.    Neurological: Negative for dizziness and headaches.  Hematological: Negative for adenopathy. Does not bruise/bleed easily.  Psychiatric/Behavioral: Negative for suicidal ideas, sleep disturbance and dysphoric mood. The patient is not nervous/anxious.        Objective:   Physical Exam  Constitutional: She is oriented to person, place, and time. She appears well-developed and well-nourished. No distress.  HENT:  Head: Normocephalic and atraumatic.  Right Ear: External ear normal.  Left Ear: External ear normal.  Nose: Nose normal.  Mouth/Throat: Oropharynx is clear and moist. No oropharyngeal exudate.  Eyes: Conjunctivae are normal. Pupils are equal, round, and reactive to light. Right eye exhibits no discharge. Left eye exhibits no discharge. No scleral icterus.  Neck: Normal range of motion. Neck supple. No tracheal deviation present. No thyromegaly present.  Cardiovascular: Normal rate, regular rhythm, normal heart sounds and intact distal pulses.  Exam reveals no gallop and no friction rub.   No murmur heard. Pulmonary/Chest: Effort normal and breath sounds normal. No accessory muscle usage. Not tachypneic. No respiratory distress. She has no decreased breath sounds. She has no wheezes. She has no rhonchi. She has no rales. She exhibits no tenderness.  Musculoskeletal: She exhibits no edema and no tenderness.       Thoracic back: She exhibits decreased range of motion, tenderness, pain and spasm. She exhibits no bony tenderness.       Back:  Lymphadenopathy:    She has no cervical adenopathy.  Neurological: She is alert and oriented to person, place, and time. No cranial nerve deficit. She exhibits normal muscle tone. Coordination normal.  Skin: Skin is warm and dry. No rash noted. She is not diaphoretic. No erythema. No pallor.  Psychiatric: She has a normal mood and affect. Her behavior is normal. Judgment and thought content normal.          Assessment & Plan:

## 2013-06-01 ENCOUNTER — Telehealth: Payer: Self-pay | Admitting: Internal Medicine

## 2013-06-01 NOTE — Telephone Encounter (Signed)
Spoke with patient, she does want to come in. She will just wait on Dr. Dan Humphreys to return and every time she has a visit with Raquel her insurance charges her more. They charge the visit as if she is seeing a specialist. Patient stated she will just double up on them herself.

## 2013-06-01 NOTE — Telephone Encounter (Signed)
Would make Dr Dan Humphreys aware of situation - for when she returns.  Thanks.

## 2013-06-01 NOTE — Telephone Encounter (Signed)
She is still having some depression after her back surgery, has been feeling more depressed and she discussed this with Dr. Dan Humphreys at her last OV. Feels like she needs to increase it just a little bit.

## 2013-06-01 NOTE — Telephone Encounter (Signed)
Pt is wondering ifshe could go up alittle on the Zoloft medication she is still feeling a little depressed. Pt uses [facility]

## 2013-06-01 NOTE — Telephone Encounter (Signed)
Given Dr Dan Humphreys is not here and this is regarding depression and adjustments in her medication,  I would recommend her being evaluated - prior to increasing the dose.  Can schedule an appt with someone this week.

## 2013-06-10 NOTE — Telephone Encounter (Signed)
Please set this pt up a follow up visit.

## 2013-06-11 NOTE — Telephone Encounter (Signed)
Appointment already scheduled for the 28th

## 2013-06-15 ENCOUNTER — Ambulatory Visit (INDEPENDENT_AMBULATORY_CARE_PROVIDER_SITE_OTHER): Payer: Medicare Other | Admitting: Internal Medicine

## 2013-06-15 ENCOUNTER — Telehealth: Payer: Self-pay | Admitting: Internal Medicine

## 2013-06-15 ENCOUNTER — Encounter: Payer: Self-pay | Admitting: Internal Medicine

## 2013-06-15 VITALS — BP 118/70 | HR 72 | Temp 98.6°F | Wt 206.0 lb

## 2013-06-15 DIAGNOSIS — F32A Depression, unspecified: Secondary | ICD-10-CM

## 2013-06-15 DIAGNOSIS — Z1239 Encounter for other screening for malignant neoplasm of breast: Secondary | ICD-10-CM

## 2013-06-15 DIAGNOSIS — G4700 Insomnia, unspecified: Secondary | ICD-10-CM

## 2013-06-15 DIAGNOSIS — M549 Dorsalgia, unspecified: Secondary | ICD-10-CM

## 2013-06-15 DIAGNOSIS — E119 Type 2 diabetes mellitus without complications: Secondary | ICD-10-CM

## 2013-06-15 DIAGNOSIS — F3289 Other specified depressive episodes: Secondary | ICD-10-CM

## 2013-06-15 DIAGNOSIS — F329 Major depressive disorder, single episode, unspecified: Secondary | ICD-10-CM

## 2013-06-15 DIAGNOSIS — G8929 Other chronic pain: Secondary | ICD-10-CM

## 2013-06-15 DIAGNOSIS — I10 Essential (primary) hypertension: Secondary | ICD-10-CM

## 2013-06-15 LAB — COMPREHENSIVE METABOLIC PANEL WITH GFR
ALT: 13 U/L (ref 0–35)
AST: 21 U/L (ref 0–37)
Albumin: 4.2 g/dL (ref 3.5–5.2)
Alkaline Phosphatase: 104 U/L (ref 39–117)
BUN: 22 mg/dL (ref 6–23)
CO2: 26 meq/L (ref 19–32)
Calcium: 10.1 mg/dL (ref 8.4–10.5)
Chloride: 105 meq/L (ref 96–112)
Creatinine, Ser: 0.8 mg/dL (ref 0.4–1.2)
GFR: 81.65 mL/min
Glucose, Bld: 132 mg/dL — ABNORMAL HIGH (ref 70–99)
Potassium: 3.7 meq/L (ref 3.5–5.1)
Sodium: 138 meq/L (ref 135–145)
Total Bilirubin: 0.5 mg/dL (ref 0.3–1.2)
Total Protein: 7.1 g/dL (ref 6.0–8.3)

## 2013-06-15 MED ORDER — SERTRALINE HCL 100 MG PO TABS: 100.0000 mg | ORAL_TABLET | Freq: Every day | ORAL | Status: DC

## 2013-06-15 MED ORDER — METOPROLOL SUCCINATE ER 25 MG PO TB24: 25.0000 mg | ORAL_TABLET | Freq: Every day | ORAL | Status: DC

## 2013-06-15 MED ORDER — GABAPENTIN 600 MG PO TABS: 600.0000 mg | ORAL_TABLET | Freq: Three times a day (TID) | ORAL | Status: DC

## 2013-06-15 MED ORDER — TRAZODONE HCL 100 MG PO TABS: 100.0000 mg | ORAL_TABLET | Freq: Every day | ORAL | Status: DC

## 2013-06-15 MED ORDER — LOSARTAN POTASSIUM-HCTZ 50-12.5 MG PO TABS: 1.0000 | ORAL_TABLET | Freq: Every day | ORAL | Status: DC

## 2013-06-15 NOTE — Assessment & Plan Note (Signed)
Symptoms improved with Sertraline, however not completely controlled. Will increase to Sertraline 100mg  daily. Follow up 4 weeks and prn.

## 2013-06-15 NOTE — Telephone Encounter (Signed)
Sent email to charge corrections Patient came in stating when she sees Erin Camacho she is being charged a Pharmacist, hospital.  Patient stated she called her insurance company and they told her to make sure it is coded as primary care not speciality

## 2013-06-15 NOTE — Progress Notes (Signed)
Subjective:    Patient ID: Erin Camacho, female    DOB: 1946/04/08, 67 y.o.   MRN: [id number]  HPI 67YO female with h/o chronic low back pain s/p recent laminectomy presents for follow up. Doing well. Continues to have some right thigh pain and weakness, made worse by increased physical activity. Taking neurontin 600mg  at bedtime with some improvement. Pain and paresthesia occasionally wakes her from sleep. Otherwise, feeling well. Symptoms of depressed mood were severe after surgery, however pt has had some improvement with Sertraline. She self-increased her dose to 75mg  daily. Would like to increase further. Continues to have episodes of panic during daytime on occasion and uses alprazolam with resolution.  Outpatient Encounter Prescriptions as of 06/15/2013  Medication Sig Dispense Refill  . albuterol (PROVENTIL) (2.5 MG/3ML) 0.083% nebulizer solution Take 3 mLs (2.5 mg total) by nebulization every 6 (six) hours as needed for wheezing. Please disp with nebulizer machine  150 mL  1  . ALPRAZolam (XANAX) 0.25 MG tablet Take 0.25 mg by mouth at bedtime as needed for sleep.       . Cholecalciferol (VITAMIN D-3) 1000 UNITS CAPS Take 1 capsule by mouth daily.      . cyclobenzaprine (FLEXERIL) 10 MG tablet Take 1 tablet (10 mg total) by mouth 3 (three) times daily as needed for muscle spasms.  90 tablet  3  . fexofenadine (ALLEGRA) 180 MG tablet Take 180 mg by mouth daily.      . fluticasone (FLONASE) 50 MCG/ACT nasal spray Place 2 sprays into the nose daily.  48 g  3  . losartan-hydrochlorothiazide (HYZAAR) 50-12.5 MG per tablet Take 1 tablet by mouth daily.  90 tablet  3  . metoprolol succinate (TOPROL XL) 25 MG 24 hr tablet Take 1 tablet (25 mg total) by mouth daily.  90 tablet  3  . Multiple Vitamin (MULTIVITAMIN) tablet Take 1 tablet by mouth daily.      Marland Kitchen omeprazole (PRILOSEC) 20 MG capsule Take 20 mg by mouth daily.      . sertraline (ZOLOFT) 100 MG tablet Take 1 tablet (100 mg total) by mouth  daily.  90 tablet  3  . simvastatin (ZOCOR) 10 MG tablet Take 0.5 tablets (5 mg total) by mouth at bedtime.  90 tablet  3  . traMADol (ULTRAM) 50 MG tablet Take 50 mg by mouth every 6 (six) hours as needed for pain.      . traZODone (DESYREL) 100 MG tablet Take 1 tablet (100 mg total) by mouth at bedtime.  90 tablet  3  . vitamin B-12 (CYANOCOBALAMIN) 1000 MCG tablet Take 1,000 mcg by mouth daily.      Marland Kitchen gabapentin (NEURONTIN) 600 MG tablet Take 1 tablet (600 mg total) by mouth 3 (three) times daily.  270 tablet  2  . lidocaine (LIDODERM) 5 % Place 1 patch onto the skin daily. Remove & Discard patch within 12 hours or as directed by MD  30 patch  0   No facility-administered encounter medications on file as of 06/15/2013.   BP 118/70  Pulse 72  Temp(Src) 98.6 F (37 C) (Oral)  Wt 206 lb (93.441 kg)  BMI 34.28 kg/m2  SpO2 98%  Review of Systems  Constitutional: Negative for fever, chills, appetite change, fatigue and unexpected weight change.  HENT: Negative for ear pain, congestion, sore throat, trouble swallowing, neck pain, voice change and sinus pressure.   Eyes: Negative for visual disturbance.  Respiratory: Negative for cough, shortness of breath, wheezing and stridor.  Cardiovascular: Negative for chest pain, palpitations and leg swelling.  Gastrointestinal: Negative for nausea, vomiting, abdominal pain, diarrhea, constipation, blood in stool, abdominal distention and anal bleeding.  Genitourinary: Negative for dysuria and flank pain.  Musculoskeletal: Positive for myalgias and back pain. Negative for arthralgias and gait problem.  Skin: Negative for color change and rash.  Neurological: Positive for weakness and numbness. Negative for dizziness and headaches.  Hematological: Negative for adenopathy. Does not bruise/bleed easily.  Psychiatric/Behavioral: Negative for suicidal ideas, sleep disturbance and dysphoric mood. The patient is not nervous/anxious.        Objective:    Physical Exam  Constitutional: She is oriented to person, place, and time. She appears well-developed and well-nourished. No distress.  HENT:  Head: Normocephalic and atraumatic.  Right Ear: External ear normal.  Left Ear: External ear normal.  Nose: Nose normal.  Mouth/Throat: Oropharynx is clear and moist. No oropharyngeal exudate.  Eyes: Conjunctivae are normal. Pupils are equal, round, and reactive to light. Right eye exhibits no discharge. Left eye exhibits no discharge. No scleral icterus.  Neck: Normal range of motion. Neck supple. No tracheal deviation present. No thyromegaly present.  Cardiovascular: Normal rate, regular rhythm, normal heart sounds and intact distal pulses.  Exam reveals no gallop and no friction rub.   No murmur heard. Pulmonary/Chest: Effort normal and breath sounds normal. No accessory muscle usage. Not tachypneic. No respiratory distress. She has no decreased breath sounds. She has no wheezes. She has no rhonchi. She has no rales. She exhibits no tenderness.  Musculoskeletal: Normal range of motion. She exhibits no edema and no tenderness.  Lymphadenopathy:    She has no cervical adenopathy.  Neurological: She is alert and oriented to person, place, and time. No cranial nerve deficit. She exhibits normal muscle tone. Coordination normal.  Skin: Skin is warm and dry. No rash noted. She is not diaphoretic. No erythema. No pallor.  Psychiatric: She has a normal mood and affect. Her behavior is normal. Judgment and thought content normal.          Assessment & Plan:

## 2013-06-15 NOTE — Assessment & Plan Note (Signed)
Pain in back is improved after surgery, however continues to have some right thigh pain and paresthesia. Will continue Neurontin. Encouraged her to try decreasing dose to 300mg  tid. Follow up 4 weeks and prn.

## 2013-06-15 NOTE — Assessment & Plan Note (Signed)
Symptoms well controlled on Trazodone 100mg  daily. Will continue.

## 2013-06-15 NOTE — Assessment & Plan Note (Signed)
Diet controlled. Will check A1c with labs today. 

## 2013-06-15 NOTE — Assessment & Plan Note (Signed)
BP Readings from Last 3 Encounters:  06/15/13 118/70  05/25/13 112/68  05/14/13 120/72   BP well controlled on current medications. Will continue.

## 2013-06-16 ENCOUNTER — Encounter: Payer: Self-pay | Admitting: Internal Medicine

## 2013-06-18 NOTE — Telephone Encounter (Signed)
Spoke with pt   Pt aware her visit will be refiled

## 2013-06-18 NOTE — Telephone Encounter (Signed)
Zella Ball - you can let the patient know if you want.  Anabell  From: Carrie Mew  Sent: Thursday, June 18, 2013 2:02 PM To: Anise Salvo Subject: RE: [place] - coding ?  Thank you  Is it ok to let the patient know that her office visit  will be refiled ?  From: Anise Salvo  Sent: Thursday, June 18, 2013 2:00 PM To: Curtis Sites Cc: Carrie Mew Subject: RE: Ressel - coding ?  Ladies - below account has been corrected.  Anabell  From: Curtis Sites  Sent: Thursday, June 18, 2013 7:52 AM To: Anise Salvo Cc: Carrie Mew Subject: Annell Greening: Gerilyn Pilgrim - coding ?  Good morning Anabell, Duncan Dull, MD is [facility] supervising provider.  Thanks Carollee Herter  From: Carrie Mew  Sent: Thursday, June 18, 2013 7:47 AM To: Curtis Sites Subject: FW: Gerilyn Pilgrim - coding ?  FYI  From: Charge Correction - Profee  Sent: Wednesday, June 17, 2013 5:35 PM To: Carrie Mew; Charge Correction - Profee Subject: RE: Posey - coding ?  Zella Ball - reason for issue around this account is due to Billing Provider indicated as Orville Govern, NP.    [facility] is not contracted with [employer] to submit an NPP as a billing provider. Name of Supervising Provider must be submitted instead.  In order to correct this account, please submit name of supervising provider. Let me know if you have any questions.  Anabell      From: Carrie Mew  Sent: Monday, June 15, 2013 3:19 PM To: Charge Correction - Profee Subject: coding ?  Erin Camacho DOB 2046-07-25 MRN# [id number]  Patient came in stating when she sees Raquel Rey she is being charged a Pharmacist, hospital.  Patient stated she called her insurance company and they told her to make sure it is coded as primary care not specialty

## 2013-06-20 LAB — HM MAMMOGRAPHY: HM Mammogram: NORMAL

## 2013-06-25 ENCOUNTER — Ambulatory Visit: Payer: Self-pay | Admitting: Internal Medicine

## 2013-07-03 ENCOUNTER — Encounter: Payer: Self-pay | Admitting: Internal Medicine

## 2013-07-15 ENCOUNTER — Encounter: Payer: Self-pay | Admitting: Internal Medicine

## 2013-07-15 ENCOUNTER — Ambulatory Visit (INDEPENDENT_AMBULATORY_CARE_PROVIDER_SITE_OTHER): Payer: Medicare Other | Admitting: Internal Medicine

## 2013-07-15 VITALS — BP 130/80 | HR 56 | Temp 98.8°F | Wt 207.0 lb

## 2013-07-15 DIAGNOSIS — M549 Dorsalgia, unspecified: Secondary | ICD-10-CM

## 2013-07-15 DIAGNOSIS — G8929 Other chronic pain: Secondary | ICD-10-CM

## 2013-07-15 DIAGNOSIS — F3289 Other specified depressive episodes: Secondary | ICD-10-CM

## 2013-07-15 DIAGNOSIS — E119 Type 2 diabetes mellitus without complications: Secondary | ICD-10-CM

## 2013-07-15 DIAGNOSIS — E669 Obesity, unspecified: Secondary | ICD-10-CM

## 2013-07-15 DIAGNOSIS — F32A Depression, unspecified: Secondary | ICD-10-CM

## 2013-07-15 DIAGNOSIS — F329 Major depressive disorder, single episode, unspecified: Secondary | ICD-10-CM

## 2013-07-15 DIAGNOSIS — I10 Essential (primary) hypertension: Secondary | ICD-10-CM

## 2013-07-15 LAB — MICROALBUMIN / CREATININE URINE RATIO
Creatinine,U: 68.7 mg/dL
Microalb Creat Ratio: 1.2 mg/g (ref 0.0–30.0)
Microalb, Ur: 0.8 mg/dL (ref 0.0–1.9)

## 2013-07-15 LAB — HEMOGLOBIN A1C: Hgb A1c MFr Bld: 6.4 % (ref 4.6–6.5)

## 2013-07-15 NOTE — Progress Notes (Signed)
Subjective:    Patient ID: Erin Camacho, female    DOB: May 30, 1946, 67 y.o.   MRN: [id number]  HPI 67 year old female with history of hypertension, diabetes, hyperlipidemia, and chronic low back pain status post lumbar laminectomy and fusion presents for followup. At her last visit, we increased dose of sertraline to help better control symptoms of depressed mood. She reports significant improvement in symptoms of depressed mood. Her energy level is also improved. She is more active.  She reports that her back pain is gradually improving. She continues to take Neurontin 600 mg only at bedtime. She does note some sedation in the mornings with this. Over time, she has become more physically active. She feels that her strength has improved.  In regards to her history of diabetes, she reports in the past her blood sugars have been controlled with diet. She does not regularly check her blood sugars. She has never taken medications for diabetes.  Outpatient Encounter Prescriptions as of 07/15/2013  Medication Sig Dispense Refill  . albuterol (PROVENTIL) (2.5 MG/3ML) 0.083% nebulizer solution Take 3 mLs (2.5 mg total) by nebulization every 6 (six) hours as needed for wheezing. Please disp with nebulizer machine  150 mL  1  . Cholecalciferol (VITAMIN D-3) 1000 UNITS CAPS Take 1 capsule by mouth daily.      . fexofenadine (ALLEGRA) 180 MG tablet Take 180 mg by mouth daily.      . fluticasone (FLONASE) 50 MCG/ACT nasal spray Place 2 sprays into the nose daily.  48 g  3  . gabapentin (NEURONTIN) 600 MG tablet Take 1 tablet (600 mg total) by mouth 3 (three) times daily.  270 tablet  2  . losartan-hydrochlorothiazide (HYZAAR) 50-12.5 MG per tablet Take 1 tablet by mouth daily.  90 tablet  3  . metoprolol succinate (TOPROL XL) 25 MG 24 hr tablet Take 1 tablet (25 mg total) by mouth daily.  90 tablet  3  . Multiple Vitamin (MULTIVITAMIN) tablet Take 1 tablet by mouth daily.      Marland Kitchen omeprazole (PRILOSEC) 20 MG  capsule Take 20 mg by mouth daily.      . sertraline (ZOLOFT) 100 MG tablet Take 1 tablet (100 mg total) by mouth daily.  90 tablet  3  . simvastatin (ZOCOR) 10 MG tablet Take 0.5 tablets (5 mg total) by mouth at bedtime.  90 tablet  3  . traZODone (DESYREL) 100 MG tablet Take 1 tablet (100 mg total) by mouth at bedtime.  90 tablet  3  . vitamin B-12 (CYANOCOBALAMIN) 1000 MCG tablet Take 1,000 mcg by mouth daily.      Marland Kitchen ALPRAZolam (XANAX) 0.25 MG tablet Take 0.25 mg by mouth at bedtime as needed for sleep.       . cyclobenzaprine (FLEXERIL) 10 MG tablet Take 1 tablet (10 mg total) by mouth 3 (three) times daily as needed for muscle spasms.  90 tablet  3  . lidocaine (LIDODERM) 5 % Place 1 patch onto the skin daily. Remove & Discard patch within 12 hours or as directed by MD  30 patch  0  . traMADol (ULTRAM) 50 MG tablet Take 50 mg by mouth every 6 (six) hours as needed for pain.       No facility-administered encounter medications on file as of 07/15/2013.   BP 130/80  Pulse 56  Temp(Src) 98.8 F (37.1 C) (Oral)  Wt 207 lb (93.895 kg)  BMI 34.45 kg/m2  SpO2 98%  Review of Systems  Constitutional: Negative for  fever, chills, appetite change, fatigue and unexpected weight change.  HENT: Negative for ear pain, congestion, sore throat, trouble swallowing, neck pain, voice change and sinus pressure.   Eyes: Negative for visual disturbance.  Respiratory: Negative for cough, shortness of breath, wheezing and stridor.   Cardiovascular: Negative for chest pain, palpitations and leg swelling.  Gastrointestinal: Negative for nausea, vomiting, abdominal pain, diarrhea, constipation, blood in stool, abdominal distention and anal bleeding.  Genitourinary: Negative for dysuria and flank pain.  Musculoskeletal: Positive for myalgias, back pain and arthralgias. Negative for gait problem.  Skin: Negative for color change and rash.  Neurological: Negative for dizziness and headaches.  Hematological:  Negative for adenopathy. Does not bruise/bleed easily.  Psychiatric/Behavioral: Negative for suicidal ideas, sleep disturbance and dysphoric mood. The patient is not nervous/anxious.        Objective:   Physical Exam  Constitutional: She is oriented to person, place, and time. She appears well-developed and well-nourished. No distress.  HENT:  Head: Normocephalic and atraumatic.  Right Ear: External ear normal.  Left Ear: External ear normal.  Nose: Nose normal.  Mouth/Throat: Oropharynx is clear and moist. No oropharyngeal exudate.  Eyes: Conjunctivae are normal. Pupils are equal, round, and reactive to light. Right eye exhibits no discharge. Left eye exhibits no discharge. No scleral icterus.  Neck: Normal range of motion. Neck supple. No tracheal deviation present. No thyromegaly present.  Cardiovascular: Normal rate, regular rhythm, normal heart sounds and intact distal pulses.  Exam reveals no gallop and no friction rub.   No murmur heard. Pulmonary/Chest: Effort normal and breath sounds normal. No accessory muscle usage. Not tachypneic. No respiratory distress. She has no decreased breath sounds. She has no wheezes. She has no rhonchi. She has no rales. She exhibits no tenderness.  Musculoskeletal: Normal range of motion. She exhibits no edema and no tenderness.  Lymphadenopathy:    She has no cervical adenopathy.  Neurological: She is alert and oriented to person, place, and time. No cranial nerve deficit. She exhibits normal muscle tone. Coordination normal.  Skin: Skin is warm and dry. No rash noted. She is not diaphoretic. No erythema. No pallor.  Psychiatric: She has a normal mood and affect. Her behavior is normal. Judgment and thought content normal.          Assessment & Plan:

## 2013-07-15 NOTE — Assessment & Plan Note (Signed)
Symptoms gradually improving. Will continue current medications, however we discussed trial of lower dose of Neurontin at bedtime of 300mg  given some sedation in the mornings. Follow up 3 months and prn.

## 2013-07-15 NOTE — Assessment & Plan Note (Signed)
BP Readings from Last 3 Encounters:  07/15/13 130/80  06/15/13 118/70  05/25/13 112/68   Blood pressure generally well controlled on current medications. Will continue. Will check renal function with labs today.

## 2013-07-15 NOTE — Assessment & Plan Note (Signed)
Wt Readings from Last 3 Encounters:  07/15/13 207 lb (93.895 kg)  06/15/13 206 lb (93.441 kg)  05/25/13 201 lb (91.173 kg)   Encouraged continued efforts at healthy diet and physical activity as tolerated.

## 2013-07-15 NOTE — Assessment & Plan Note (Signed)
Patient has history of elevated blood sugars in the past however blood sugars have been controlled with diet. Will check A1c with labs today. Encouraged continued effort at healthy diet and regular physical activity.

## 2013-07-15 NOTE — Assessment & Plan Note (Signed)
Symptoms are much improved on higher dose of sertraline. Will continue.

## 2013-07-16 LAB — COMPREHENSIVE METABOLIC PANEL WITH GFR
ALT: 14 U/L (ref 0–35)
AST: 17 U/L (ref 0–37)
Albumin: 4.1 g/dL (ref 3.5–5.2)
Alkaline Phosphatase: 83 U/L (ref 39–117)
BUN: 20 mg/dL (ref 6–23)
CO2: 28 meq/L (ref 19–32)
Calcium: 10 mg/dL (ref 8.4–10.5)
Chloride: 106 meq/L (ref 96–112)
Creatinine, Ser: 0.9 mg/dL (ref 0.4–1.2)
GFR: 67 mL/min
Glucose, Bld: 105 mg/dL — ABNORMAL HIGH (ref 70–99)
Potassium: 4.1 meq/L (ref 3.5–5.1)
Sodium: 139 meq/L (ref 135–145)
Total Bilirubin: 0.5 mg/dL (ref 0.3–1.2)
Total Protein: 6.9 g/dL (ref 6.0–8.3)

## 2013-07-20 ENCOUNTER — Encounter: Payer: Self-pay | Admitting: Internal Medicine

## 2013-08-12 ENCOUNTER — Telehealth: Payer: Self-pay | Admitting: Internal Medicine

## 2013-08-12 NOTE — Telephone Encounter (Signed)
Who is Simona Huh from [facility]? If pt wants to have FOBT, then we can order this.

## 2013-08-12 NOTE — Telephone Encounter (Signed)
Erin Camacho from [facility] stated the patient is due for a fecal blood test, which they like for it to be done annually.

## 2013-08-12 NOTE — Telephone Encounter (Signed)
Fwd to Dr. Walker 

## 2013-08-14 NOTE — Telephone Encounter (Signed)
Left message to call back  

## 2013-08-19 NOTE — Telephone Encounter (Signed)
Spoke with patient she stated they called her and told her she needed to have this done, left up front for patient to pick up.

## 2013-08-19 NOTE — Telephone Encounter (Signed)
Left message to call back  

## 2013-08-26 ENCOUNTER — Other Ambulatory Visit (INDEPENDENT_AMBULATORY_CARE_PROVIDER_SITE_OTHER): Payer: Medicare Other

## 2013-08-26 DIAGNOSIS — Z1211 Encounter for screening for malignant neoplasm of colon: Secondary | ICD-10-CM

## 2013-08-27 ENCOUNTER — Other Ambulatory Visit: Payer: Self-pay | Admitting: *Deleted

## 2013-08-27 DIAGNOSIS — Z1211 Encounter for screening for malignant neoplasm of colon: Secondary | ICD-10-CM

## 2013-08-27 LAB — FECAL OCCULT BLOOD, IMMUNOCHEMICAL: Fecal Occult Bld: NEGATIVE

## 2013-09-24 ENCOUNTER — Other Ambulatory Visit: Payer: Self-pay

## 2013-10-07 ENCOUNTER — Encounter: Payer: Self-pay | Admitting: *Deleted

## 2013-10-07 ENCOUNTER — Ambulatory Visit: Payer: Medicare Other | Admitting: Internal Medicine

## 2013-10-08 ENCOUNTER — Encounter: Payer: Self-pay | Admitting: Internal Medicine

## 2013-10-08 ENCOUNTER — Ambulatory Visit (INDEPENDENT_AMBULATORY_CARE_PROVIDER_SITE_OTHER): Payer: Medicare Other | Admitting: Internal Medicine

## 2013-10-08 VITALS — BP 130/74 | HR 56 | Temp 98.4°F | Wt 208.0 lb

## 2013-10-08 DIAGNOSIS — E119 Type 2 diabetes mellitus without complications: Secondary | ICD-10-CM

## 2013-10-08 DIAGNOSIS — I10 Essential (primary) hypertension: Secondary | ICD-10-CM

## 2013-10-08 DIAGNOSIS — J0100 Acute maxillary sinusitis, unspecified: Secondary | ICD-10-CM | POA: Insufficient documentation

## 2013-10-08 LAB — COMPREHENSIVE METABOLIC PANEL WITH GFR
ALT: 13 U/L (ref 0–35)
AST: 19 U/L (ref 0–37)
Albumin: 4.3 g/dL (ref 3.5–5.2)
Alkaline Phosphatase: 85 U/L (ref 39–117)
BUN: 23 mg/dL (ref 6–23)
CO2: 29 meq/L (ref 19–32)
Calcium: 10 mg/dL (ref 8.4–10.5)
Chloride: 103 meq/L (ref 96–112)
Creatinine, Ser: 0.8 mg/dL (ref 0.4–1.2)
GFR: 72.57 mL/min
Glucose, Bld: 88 mg/dL (ref 70–99)
Potassium: 4.4 meq/L (ref 3.5–5.1)
Sodium: 138 meq/L (ref 135–145)
Total Bilirubin: 0.9 mg/dL (ref 0.3–1.2)
Total Protein: 7.3 g/dL (ref 6.0–8.3)

## 2013-10-08 LAB — HEMOGLOBIN A1C: Hgb A1c MFr Bld: 6 % (ref 4.6–6.5)

## 2013-10-08 MED ORDER — OMEPRAZOLE 20 MG PO CPDR: 20.0000 mg | DELAYED_RELEASE_CAPSULE | Freq: Every day | ORAL | Status: DC

## 2013-10-08 MED ORDER — AZITHROMYCIN 250 MG PO TABS: ORAL_TABLET | ORAL | Status: DC

## 2013-10-08 NOTE — Assessment & Plan Note (Signed)
BP Readings from Last 3 Encounters:  10/08/13 130/74  07/15/13 130/80  06/15/13 118/70   BP well controlled on current medications. Renal function normal today. Follow up 6 months and prn.

## 2013-10-08 NOTE — Assessment & Plan Note (Signed)
Lab Results  Component Value Date   HGBA1C 6.0 10/08/2013   Blood sugars in range of risk of diabetes, but not consistent with DM. We discussed checking A1c every 6 months rather than every 3 months.

## 2013-10-08 NOTE — Progress Notes (Signed)
Subjective:    Patient ID: Erin Camacho, female    DOB: 11-14-1946, 67 y.o.   MRN: [id number]  HPI 67YO female with diet-controlled diabetes, hypertension, hyperlipidemia, and chronic back pain s/p lumbar laminectomy presents for follow up. She reports she is feeling well. Back pain has completely resolved. She is no longer using any pain medication. She has started a walking program with the blessing of her surgeon. She is compliant with other medications.  Her only other concern today is one week history of nasal congestion, sinus pressure, cough productive of purulent sputum. She denies any fever, chills, shortness of breath, chest pain. She has been using Allegra and Flonase with no improvement.  Outpatient Prescriptions Prior to Visit  Medication Sig Dispense Refill  . albuterol (PROVENTIL) (2.5 MG/3ML) 0.083% nebulizer solution Take 3 mLs (2.5 mg total) by nebulization every 6 (six) hours as needed for wheezing. Please disp with nebulizer machine  150 mL  1  . ALPRAZolam (XANAX) 0.25 MG tablet Take 0.25 mg by mouth at bedtime as needed for sleep.       . Cholecalciferol (VITAMIN D-3) 1000 UNITS CAPS Take 1 capsule by mouth daily.      . fexofenadine (ALLEGRA) 180 MG tablet Take 180 mg by mouth daily.      . fluticasone (FLONASE) 50 MCG/ACT nasal spray Place 2 sprays into the nose daily.  48 g  3  . losartan-hydrochlorothiazide (HYZAAR) 50-12.5 MG per tablet Take 1 tablet by mouth daily.  90 tablet  3  . metoprolol succinate (TOPROL XL) 25 MG 24 hr tablet Take 1 tablet (25 mg total) by mouth daily.  90 tablet  3  . Multiple Vitamin (MULTIVITAMIN) tablet Take 1 tablet by mouth daily.      . sertraline (ZOLOFT) 100 MG tablet Take 1 tablet (100 mg total) by mouth daily.  90 tablet  3  . simvastatin (ZOCOR) 10 MG tablet Take 0.5 tablets (5 mg total) by mouth at bedtime.  90 tablet  3  . traZODone (DESYREL) 100 MG tablet Take 1 tablet (100 mg total) by mouth at bedtime.  90 tablet  3  .  omeprazole (PRILOSEC) 20 MG capsule Take 20 mg by mouth daily.      . cyclobenzaprine (FLEXERIL) 10 MG tablet Take 1 tablet (10 mg total) by mouth 3 (three) times daily as needed for muscle spasms.  90 tablet  3  . vitamin B-12 (CYANOCOBALAMIN) 1000 MCG tablet Take 1,000 mcg by mouth daily.      Marland Kitchen gabapentin (NEURONTIN) 600 MG tablet Take 1 tablet (600 mg total) by mouth 3 (three) times daily.  270 tablet  2  . lidocaine (LIDODERM) 5 % Place 1 patch onto the skin daily. Remove & Discard patch within 12 hours or as directed by MD  30 patch  0  . traMADol (ULTRAM) 50 MG tablet Take 50 mg by mouth every 6 (six) hours as needed for pain.       No facility-administered medications prior to visit.   BP 130/74  Pulse 56  Temp(Src) 98.4 F (36.9 C) (Oral)  Wt 208 lb (94.348 kg)  SpO2 97%  Review of Systems  Constitutional: Negative for fever, chills, appetite change, fatigue and unexpected weight change.  HENT: Positive for congestion, postnasal drip and sinus pressure. Negative for ear pain, sore throat, trouble swallowing and voice change.   Eyes: Negative for visual disturbance.  Respiratory: Positive for cough. Negative for shortness of breath, wheezing and stridor.  Cardiovascular: Negative for chest pain, palpitations and leg swelling.  Gastrointestinal: Negative for nausea, vomiting, abdominal pain, diarrhea, constipation, blood in stool, abdominal distention and anal bleeding.  Genitourinary: Negative for dysuria and flank pain.  Musculoskeletal: Negative for arthralgias, gait problem, myalgias and neck pain.  Skin: Negative for color change and rash.  Neurological: Negative for dizziness and headaches.  Hematological: Negative for adenopathy. Does not bruise/bleed easily.  Psychiatric/Behavioral: Negative for suicidal ideas, sleep disturbance and dysphoric mood. The patient is not nervous/anxious.        Objective:   Physical Exam  Constitutional: She is oriented to person, place,  and time. She appears well-developed and well-nourished. No distress.  HENT:  Head: Normocephalic and atraumatic.  Right Ear: External ear normal.  Left Ear: External ear normal.  Nose: Mucosal edema present. Right sinus exhibits maxillary sinus tenderness. Left sinus exhibits maxillary sinus tenderness.  Mouth/Throat: Oropharynx is clear and moist. No oropharyngeal exudate.  Eyes: Conjunctivae are normal. Pupils are equal, round, and reactive to light. Right eye exhibits no discharge. Left eye exhibits no discharge. No scleral icterus.  Neck: Normal range of motion. Neck supple. No tracheal deviation present. No thyromegaly present.  Cardiovascular: Normal rate, regular rhythm, normal heart sounds and intact distal pulses.  Exam reveals no gallop and no friction rub.   No murmur heard. Pulmonary/Chest: Effort normal and breath sounds normal. No accessory muscle usage. Not tachypneic. No respiratory distress. She has no decreased breath sounds. She has no wheezes. She has no rhonchi. She has no rales. She exhibits no tenderness.  Musculoskeletal: Normal range of motion. She exhibits no edema and no tenderness.  Lymphadenopathy:    She has no cervical adenopathy.  Neurological: She is alert and oriented to person, place, and time. No cranial nerve deficit. She exhibits normal muscle tone. Coordination normal.  Skin: Skin is warm and dry. No rash noted. She is not diaphoretic. No erythema. No pallor.  Psychiatric: She has a normal mood and affect. Her behavior is normal. Judgment and thought content normal.          Assessment & Plan:

## 2013-10-08 NOTE — Progress Notes (Signed)
Pre-visit discussion using our clinic review tool. No additional management support is needed unless otherwise documented below in the visit note.  

## 2013-10-08 NOTE — Assessment & Plan Note (Signed)
Symptoms and exam most consistent with acute maxillary sinusitis. Will treat with azithromycin. Continue Allegra and Flonase. Follow up if symptoms are not improving.

## 2013-12-11 ENCOUNTER — Telehealth: Payer: Self-pay | Admitting: Internal Medicine

## 2013-12-11 NOTE — Telephone Encounter (Signed)
I would encourage her either to be seen at [facility] or in ED if fever and headache.

## 2013-12-11 NOTE — Telephone Encounter (Signed)
Spoke with patient informed her she should be seen per Dr. Gilford Rile. Patient stated she think she will let it run its course and see if the instructions given by triage nurse will help. She does feel a little better and has no transportation to go anywhere. Informed patient if she feels like she is getting worse then she needs to be evaluated by someone so go to either the ED or an urgent care.

## 2013-12-11 NOTE — Telephone Encounter (Signed)
Patient Information:  Caller Name: Vidal Schwalbe  Phone: [phone]  Patient: Erin Camacho, Erin Camacho  Gender: Female  DOB: 1946/03/04  Age: 68 Years  PCP: Ronette Deter (Adults only)  Office Follow Up:  Does the office need to follow up with this patient?: No  Instructions For The Office: N/A  RN Note:  Spouse states patient developed nausea, dry heaves, frontal headache. Onset 0200 12/11/13. No vomiting. States loose bowel movement X 6 12/11/13. Patient is tolerating fluids well. Urinating normally for patient. Patient states she developed a frontal headache at a " 4" on 1-10 scale. Onset 0200 12/11/13. Care advice and diet advice given per guidelines. Call back parameters reviewed. Spouse verbalizes understanding.  Symptoms  Reason For Call & Symptoms: Fever, Nausea, Dry heaves, Diarrhea  Reviewed Health History In EMR: Yes  Reviewed Medications In EMR: Yes  Reviewed Allergies In EMR: Yes  Reviewed Surgeries / Procedures: Yes  Date of Onset of Symptoms: 12/11/2013  Any Fever: Yes  Fever Taken: Oral  Fever Time Of Reading: 10:30:00  Fever Last Reading: 99.4  Guideline(s) Used:  Diarrhea  Headache  Disposition Per Guideline:   See Today or Tomorrow in Office  Reason For Disposition Reached:   New headache and age > 31  Advice Given:  Fluids:  Drink more fluids, at least 8-10 glasses (8 oz or 240 ml) daily.  Supplement this with saltine crackers or soups to make certain that you are getting sufficient fluid and salt to meet your body's needs.  Nutrition:  Maintaining some food intake during episodes of diarrhea is important.  Ideal initial foods include boiled starches/cereals (e.g., potatoes, rice, noodles, wheat, oats) with a small amount of salt to taste.  Nutrition:  Maintaining some food intake during episodes of diarrhea is important.  Ideal initial foods include boiled starches/cereals (e.g., potatoes, rice, noodles, wheat, oats) with a small amount of salt to taste.  Other  acceptable foods include: bananas, yogurt, crackers, soup.  Call Back If:  Signs of dehydration occur (e.g., no urine for more than 12 hours, very dry mouth, lightheaded, etc.)  You become worse.  Call Back If:  You become worse.  Apply Cold to the Area:   Apply a cold wet washcloth or cold pack to the forehead for 20 minutes.  Rest:   Lie down in a dark, quiet place and try to relax. Close your eyes and imagine your entire body relaxing.  Patient Refused Recommendation:  Patient Will Follow Up With Office Later  Patient declines offered appt. at [facility] office. Patient states she will try care advice and return call for appt. if no improvement. Call back parameters reviewed. Spouse verbalizes understanding.

## 2013-12-11 NOTE — Telephone Encounter (Signed)
FYI

## 2013-12-28 ENCOUNTER — Encounter: Payer: Self-pay | Admitting: *Deleted

## 2014-01-18 ENCOUNTER — Encounter: Payer: Self-pay | Admitting: Internal Medicine

## 2014-01-18 ENCOUNTER — Ambulatory Visit (INDEPENDENT_AMBULATORY_CARE_PROVIDER_SITE_OTHER): Payer: Commercial Managed Care - HMO | Admitting: Internal Medicine

## 2014-01-18 VITALS — BP 150/80 | HR 52 | Temp 98.2°F | Ht 65.5 in | Wt 215.0 lb

## 2014-01-18 DIAGNOSIS — F32A Depression, unspecified: Secondary | ICD-10-CM

## 2014-01-18 DIAGNOSIS — F329 Major depressive disorder, single episode, unspecified: Secondary | ICD-10-CM

## 2014-01-18 DIAGNOSIS — L989 Disorder of the skin and subcutaneous tissue, unspecified: Secondary | ICD-10-CM | POA: Insufficient documentation

## 2014-01-18 DIAGNOSIS — Z23 Encounter for immunization: Secondary | ICD-10-CM

## 2014-01-18 DIAGNOSIS — E785 Hyperlipidemia, unspecified: Secondary | ICD-10-CM

## 2014-01-18 DIAGNOSIS — F3289 Other specified depressive episodes: Secondary | ICD-10-CM

## 2014-01-18 DIAGNOSIS — Z9884 Bariatric surgery status: Secondary | ICD-10-CM | POA: Insufficient documentation

## 2014-01-18 DIAGNOSIS — Z9889 Other specified postprocedural states: Secondary | ICD-10-CM

## 2014-01-18 DIAGNOSIS — I10 Essential (primary) hypertension: Secondary | ICD-10-CM

## 2014-01-18 DIAGNOSIS — J309 Allergic rhinitis, unspecified: Secondary | ICD-10-CM

## 2014-01-18 DIAGNOSIS — G4700 Insomnia, unspecified: Secondary | ICD-10-CM

## 2014-01-18 DIAGNOSIS — Z0000 Encounter for general adult medical examination without abnormal findings: Secondary | ICD-10-CM

## 2014-01-18 LAB — COMPREHENSIVE METABOLIC PANEL WITH GFR
ALT: 17 U/L (ref 0–35)
AST: 21 U/L (ref 0–37)
Albumin: 4.2 g/dL (ref 3.5–5.2)
Alkaline Phosphatase: 71 U/L (ref 39–117)
BUN: 23 mg/dL (ref 6–23)
CO2: 28 meq/L (ref 19–32)
Calcium: 10.3 mg/dL (ref 8.4–10.5)
Chloride: 105 meq/L (ref 96–112)
Creatinine, Ser: 0.9 mg/dL (ref 0.4–1.2)
GFR: 69.6 mL/min
Glucose, Bld: 95 mg/dL (ref 70–99)
Potassium: 4 meq/L (ref 3.5–5.1)
Sodium: 139 meq/L (ref 135–145)
Total Bilirubin: 0.6 mg/dL (ref 0.3–1.2)
Total Protein: 7.3 g/dL (ref 6.0–8.3)

## 2014-01-18 LAB — CBC WITH DIFFERENTIAL/PLATELET
Basophils Absolute: 0 10*3/uL (ref 0.0–0.1)
Basophils Relative: 0.7 % (ref 0.0–3.0)
Eosinophils Absolute: 0.3 10*3/uL (ref 0.0–0.7)
Eosinophils Relative: 5.3 % — ABNORMAL HIGH (ref 0.0–5.0)
HCT: 38.4 % (ref 36.0–46.0)
Hemoglobin: 12.7 g/dL (ref 12.0–15.0)
Lymphocytes Relative: 30.8 % (ref 12.0–46.0)
Lymphs Abs: 1.6 10*3/uL (ref 0.7–4.0)
MCHC: 32.9 g/dL (ref 30.0–36.0)
MCV: 88.6 fl (ref 78.0–100.0)
Monocytes Absolute: 0.5 10*3/uL (ref 0.1–1.0)
Monocytes Relative: 9.8 % (ref 3.0–12.0)
Neutro Abs: 2.8 10*3/uL (ref 1.4–7.7)
Neutrophils Relative %: 53.4 % (ref 43.0–77.0)
Platelets: 194 10*3/uL (ref 150.0–400.0)
RBC: 4.33 Mil/uL (ref 3.87–5.11)
RDW: 16.5 % — ABNORMAL HIGH (ref 11.5–14.6)
WBC: 5.3 10*3/uL (ref 4.5–10.5)

## 2014-01-18 LAB — MICROALBUMIN / CREATININE URINE RATIO
Creatinine,U: 51 mg/dL
Microalb Creat Ratio: 0.4 mg/g (ref 0.0–30.0)
Microalb, Ur: 0.2 mg/dL (ref 0.0–1.9)

## 2014-01-18 LAB — LIPID PANEL
Cholesterol: 168 mg/dL (ref 0–200)
HDL: 66.6 mg/dL
LDL Cholesterol: 82 mg/dL (ref 0–99)
Total CHOL/HDL Ratio: 3
Triglycerides: 99 mg/dL (ref 0.0–149.0)
VLDL: 19.8 mg/dL (ref 0.0–40.0)

## 2014-01-18 MED ORDER — LOSARTAN POTASSIUM-HCTZ 50-12.5 MG PO TABS: 1.0000 | ORAL_TABLET | Freq: Every day | ORAL | Status: DC

## 2014-01-18 MED ORDER — SERTRALINE HCL 100 MG PO TABS: 100.0000 mg | ORAL_TABLET | Freq: Every day | ORAL | Status: DC

## 2014-01-18 MED ORDER — TRAZODONE HCL 100 MG PO TABS: 100.0000 mg | ORAL_TABLET | Freq: Every day | ORAL | Status: DC

## 2014-01-18 MED ORDER — METOPROLOL SUCCINATE ER 25 MG PO TB24: 25.0000 mg | ORAL_TABLET | Freq: Every day | ORAL | Status: DC

## 2014-01-18 MED ORDER — SIMVASTATIN 10 MG PO TABS: 5.0000 mg | ORAL_TABLET | Freq: Every day | ORAL | Status: DC

## 2014-01-18 MED ORDER — FEXOFENADINE HCL 180 MG PO TABS: 180.0000 mg | ORAL_TABLET | Freq: Every day | ORAL | Status: DC

## 2014-01-18 MED ORDER — OMEPRAZOLE 20 MG PO CPDR: 20.0000 mg | DELAYED_RELEASE_CAPSULE | Freq: Two times a day (BID) | ORAL | Status: DC

## 2014-01-18 MED ORDER — FLUTICASONE PROPIONATE 50 MCG/ACT NA SUSP: 2.0000 | Freq: Every day | NASAL | Status: DC

## 2014-01-18 NOTE — Assessment & Plan Note (Signed)
Few nevi noted over face and trunk. Will set up dermatology evaluation for skin cancer screening.

## 2014-01-18 NOTE — Assessment & Plan Note (Signed)
BP Readings from Last 3 Encounters:  01/18/14 150/80  10/08/13 130/74  07/15/13 130/80   BP generally well controlled on losartan-HCTZ and metoprolol. Will continue.

## 2014-01-18 NOTE — Progress Notes (Signed)
Subjective:    Patient ID: Erin Camacho, female    DOB: January 14, 1946, 68 y.o.   MRN: [id number]  HPI The patient is here for annual Medicare wellness examination and management of other chronic and acute problems.   The risk factors are reflected in the social history.  The roster of all physicians providing medical care to patient - is listed in the Snapshot section of the chart.  Activities of daily living:  The patient is 100% independent in all ADLs: dressing, toileting, feeding as well as independent mobility. Lives with husband and has 1 dog. Lives in tri-level home. Uses rails while walking stairs. Floors are carpeted.  Home safety : The patient has smoke detectors and CO detector in the home. They wear seatbelts.  There are no firearms at home. There is no violence in the home.   There is no risks for hepatitis, STDs or HIV. There is no history of blood transfusion. They have no travel history to infectious disease endemic areas of the world.  The patient has seen their dentist in the last six month. (Dr. Juleen China) They have seen their eye doctor in the last year. (Dr. Chong Sicilian) Evaluated for hearing loss by Dr. Kathyrn Sheriff in 2012, normal. They have deferred audiologic testing in the last year.   They do not  have excessive sun exposure. Discussed the need for sun protection: hats, long sleeves and use of sunscreen if there is significant sun exposure. (Derm - Dr. Diona Fanti)  Diet: the importance of a healthy diet is discussed. They do have a relatively healthy diet. Notes increased sweets. Trying to limit.  The benefits of regular aerobic exercise were discussed. She is a member of a gym, was doing water walking. Has not gone recently.  Depression screen: there are no signs or vegative symptoms of depression- irritability, change in appetite, anhedonia, sadness/tearfullness.  Cognitive assessment: the patient manages all their financial and personal affairs and is actively  engaged. They could relate day,date,year and events.  HCPOA - POA - husband, Arnette Peterkin.  The following portions of the patient's history were reviewed and updated as appropriate: allergies, current medications, past family history, past medical history,  past surgical history, past social history  and problem list.  Visual acuity was not assessed per patient preference since she has regular follow up with her ophthalmologist. Hearing and body mass index were assessed and reviewed.   During the course of the visit the patient was educated and counseled about appropriate screening and preventive services including : fall prevention , diabetes screening, nutrition counseling, colorectal cancer screening, and recommended immunizations.    Outpatient Encounter Prescriptions as of 01/18/2014  Medication Sig  . albuterol (PROVENTIL) (2.5 MG/3ML) 0.083% nebulizer solution Take 3 mLs (2.5 mg total) by nebulization every 6 (six) hours as needed for wheezing. Please disp with nebulizer machine  . ALPRAZolam (XANAX) 0.25 MG tablet Take 0.25 mg by mouth at bedtime as needed for sleep.   . Cholecalciferol (VITAMIN D-3) 1000 UNITS CAPS Take 1 capsule by mouth daily.  . fexofenadine (ALLEGRA) 180 MG tablet Take 180 mg by mouth daily.  . fluticasone (FLONASE) 50 MCG/ACT nasal spray Place 2 sprays into the nose daily.  Marland Kitchen losartan-hydrochlorothiazide (HYZAAR) 50-12.5 MG per tablet Take 1 tablet by mouth daily.  . metoprolol succinate (TOPROL XL) 25 MG 24 hr tablet Take 1 tablet (25 mg total) by mouth daily.  . Multiple Vitamin (MULTIVITAMIN) tablet Take 1 tablet by mouth daily.  Marland Kitchen omeprazole (PRILOSEC) 20  MG capsule Take 1 capsule (20 mg total) by mouth daily.  . sertraline (ZOLOFT) 100 MG tablet Take 1 tablet (100 mg total) by mouth daily.  . simvastatin (ZOCOR) 10 MG tablet Take 0.5 tablets (5 mg total) by mouth at bedtime.  . traZODone (DESYREL) 100 MG tablet Take 1 tablet (100 mg total) by mouth at  bedtime.  . cyclobenzaprine (FLEXERIL) 10 MG tablet Take 1 tablet (10 mg total) by mouth 3 (three) times daily as needed for muscle spasms.  . vitamin B-12 (CYANOCOBALAMIN) 1000 MCG tablet Take 1,000 mcg by mouth daily.   BP 150/80  Pulse 52  Temp(Src) 98.2 F (36.8 C) (Oral)  Ht 5' 5.5" (1.664 m)  Wt 215 lb (97.523 kg)  BMI 35.22 kg/m2  SpO2 97%    Review of Systems  Constitutional: Negative for fever, chills, appetite change, fatigue and unexpected weight change.  HENT: Negative for congestion, ear pain, sinus pressure, sore throat, trouble swallowing and voice change.   Eyes: Negative for visual disturbance.  Respiratory: Negative for cough, shortness of breath, wheezing and stridor.   Cardiovascular: Negative for chest pain, palpitations and leg swelling.  Gastrointestinal: Negative for nausea, vomiting, abdominal pain, diarrhea, constipation, blood in stool, abdominal distention and anal bleeding.  Genitourinary: Negative for dysuria and flank pain.  Musculoskeletal: Negative for arthralgias, gait problem, myalgias and neck pain.  Skin: Negative for color change and rash.  Neurological: Negative for dizziness and headaches.  Hematological: Negative for adenopathy. Does not bruise/bleed easily.  Psychiatric/Behavioral: Negative for suicidal ideas, sleep disturbance and dysphoric mood. The patient is not nervous/anxious.        Objective:   Physical Exam  Constitutional: She is oriented to person, place, and time. She appears well-developed and well-nourished. No distress.  HENT:  Head: Normocephalic and atraumatic.  Right Ear: External ear normal.  Left Ear: External ear normal.  Nose: Nose normal.  Mouth/Throat: Oropharynx is clear and moist. No oropharyngeal exudate.  Eyes: Conjunctivae are normal. Pupils are equal, round, and reactive to light. Right eye exhibits no discharge. Left eye exhibits no discharge. No scleral icterus.  Neck: Normal range of motion. Neck  supple. No tracheal deviation present. No thyromegaly present.  Cardiovascular: Normal rate, regular rhythm, normal heart sounds and intact distal pulses.  Exam reveals no gallop and no friction rub.   No murmur heard. Pulmonary/Chest: Effort normal and breath sounds normal. No accessory muscle usage. Not tachypneic. No respiratory distress. She has no decreased breath sounds. She has no wheezes. She has no rales. She exhibits no tenderness. Right breast exhibits no inverted nipple, no mass, no nipple discharge, no skin change and no tenderness. Left breast exhibits no inverted nipple, no mass, no nipple discharge, no skin change and no tenderness. Breasts are symmetrical.  Abdominal: Soft. Bowel sounds are normal. She exhibits no distension and no mass. There is no tenderness. There is no rebound and no guarding.  Musculoskeletal: Normal range of motion. She exhibits no edema and no tenderness.  Lymphadenopathy:    She has no cervical adenopathy.  Neurological: She is alert and oriented to person, place, and time. No cranial nerve deficit. She exhibits normal muscle tone. Coordination normal.  Skin: Skin is warm and dry. No rash noted. She is not diaphoretic. No erythema. No pallor.  Psychiatric: She has a normal mood and affect. Her behavior is normal. Judgment and thought content normal.          Assessment & Plan:

## 2014-01-18 NOTE — Assessment & Plan Note (Signed)
S/p lap band procedure at [facility]. Will set up referral for follow up care.

## 2014-01-18 NOTE — Assessment & Plan Note (Signed)
Will make follow up referral to orthopedics for continuation of care.

## 2014-01-18 NOTE — Assessment & Plan Note (Signed)
General medical exam including breast exam normal today.  PAP and pelvic deferred given pt age and pt preference. Appropriate screening performed.  Will check labs including CMP, CBC, lipids. Immunizations are UTD except for Prevnar which was given today. Encouraged healthy diet and regular exercise.

## 2014-01-18 NOTE — Assessment & Plan Note (Signed)
Will check lipids and LFTs with labs today. 

## 2014-01-18 NOTE — Progress Notes (Signed)
Pre visit review using our clinic review tool, if applicable. No additional management support is needed unless otherwise documented below in the visit note. 

## 2014-01-19 ENCOUNTER — Telehealth: Payer: Self-pay | Admitting: Internal Medicine

## 2014-01-19 NOTE — Telephone Encounter (Signed)
Relevant patient education assigned to patient using Emmi. ° °

## 2014-02-02 ENCOUNTER — Encounter: Payer: Self-pay | Admitting: Internal Medicine

## 2014-02-02 ENCOUNTER — Ambulatory Visit (INDEPENDENT_AMBULATORY_CARE_PROVIDER_SITE_OTHER): Payer: Commercial Managed Care - HMO | Admitting: Internal Medicine

## 2014-02-02 VITALS — BP 122/72 | HR 56 | Temp 98.3°F | Wt 216.0 lb

## 2014-02-02 DIAGNOSIS — J209 Acute bronchitis, unspecified: Secondary | ICD-10-CM | POA: Insufficient documentation

## 2014-02-02 MED ORDER — LEVOFLOXACIN 500 MG PO TABS: 500.0000 mg | ORAL_TABLET | Freq: Every day | ORAL | Status: DC

## 2014-02-02 MED ORDER — HYDROCOD POLST-CHLORPHEN POLST 10-8 MG/5ML PO LQCR: 5.0000 mL | Freq: Two times a day (BID) | ORAL | Status: DC | PRN

## 2014-02-02 MED ORDER — PREDNISONE 10 MG PO TABS: ORAL_TABLET | ORAL | Status: DC

## 2014-02-02 NOTE — Progress Notes (Signed)
Subjective:    Patient ID: Syndey Camacho, female    DOB: March 02, 1946, 68 y.o.   MRN: [id number]  HPI 68YO female presents for acute visit.  Several weeks of cough. Friday had sore throat, increased congestion. Cough became more productive. Some dyspnea. Took two albuterol treatments with some improvement. Also took Robitussin with mild improvement in cough. No fever. No known sick contacts.  Outpatient Encounter Prescriptions as of 02/02/2014  Medication Sig  . albuterol (PROVENTIL) (2.5 MG/3ML) 0.083% nebulizer solution Take 3 mLs (2.5 mg total) by nebulization every 6 (six) hours as needed for wheezing. Please disp with nebulizer machine  . ALPRAZolam (XANAX) 0.25 MG tablet Take 0.25 mg by mouth at bedtime as needed for sleep.   . Cholecalciferol (VITAMIN D-3) 1000 UNITS CAPS Take 1 capsule by mouth daily.  . fexofenadine (ALLEGRA) 180 MG tablet Take 1 tablet (180 mg total) by mouth daily.  . fluticasone (FLONASE) 50 MCG/ACT nasal spray Place 2 sprays into both nostrils daily.  Marland Kitchen losartan-hydrochlorothiazide (HYZAAR) 50-12.5 MG per tablet Take 1 tablet by mouth daily.  . metoprolol succinate (TOPROL XL) 25 MG 24 hr tablet Take 1 tablet (25 mg total) by mouth daily.  . Multiple Vitamin (MULTIVITAMIN) tablet Take 1 tablet by mouth daily.  Marland Kitchen omeprazole (PRILOSEC) 20 MG capsule Take 1 capsule (20 mg total) by mouth 2 (two) times daily before a meal.  . sertraline (ZOLOFT) 100 MG tablet Take 1 tablet (100 mg total) by mouth daily.  . simvastatin (ZOCOR) 10 MG tablet Take 0.5 tablets (5 mg total) by mouth at bedtime.  . traZODone (DESYREL) 100 MG tablet Take 1 tablet (100 mg total) by mouth at bedtime.  . chlorpheniramine-HYDROcodone (TUSSIONEX) 10-8 MG/5ML LQCR Take 5 mLs by mouth every 12 (twelve) hours as needed for cough.  . cyclobenzaprine (FLEXERIL) 10 MG tablet Take 1 tablet (10 mg total) by mouth 3 (three) times daily as needed for muscle spasms.  Marland Kitchen levofloxacin (LEVAQUIN) 500 MG tablet  Take 1 tablet (500 mg total) by mouth daily.  . predniSONE (DELTASONE) 10 MG tablet Take 60mg  by mouth on day 1, then taper by 10mg  daily until gone  . [DISCONTINUED] vitamin B-12 (CYANOCOBALAMIN) 1000 MCG tablet Take 1,000 mcg by mouth daily.     Review of Systems  Constitutional: Positive for fatigue. Negative for fever, chills and unexpected weight change.  HENT: Positive for sore throat. Negative for congestion, ear discharge, ear pain, facial swelling, hearing loss, mouth sores, nosebleeds, postnasal drip, rhinorrhea, sinus pressure, sneezing, tinnitus, trouble swallowing and voice change.   Eyes: Negative for pain, discharge, redness and visual disturbance.  Respiratory: Positive for cough, shortness of breath and wheezing. Negative for chest tightness and stridor.   Cardiovascular: Negative for chest pain, palpitations and leg swelling.  Musculoskeletal: Negative for arthralgias, myalgias, neck pain and neck stiffness.  Skin: Negative for color change and rash.  Neurological: Negative for dizziness, weakness, light-headedness and headaches.  Hematological: Negative for adenopathy.       Objective:    BP 122/72  Pulse 56  Temp(Src) 98.3 F (36.8 C) (Oral)  Wt 216 lb (97.977 kg)  SpO2 94% Physical Exam  Constitutional: She is oriented to person, place, and time. She appears well-developed and well-nourished. No distress.  HENT:  Head: Normocephalic and atraumatic.  Right Ear: External ear normal.  Left Ear: External ear normal.  Nose: Nose normal.  Mouth/Throat: Oropharynx is clear and moist. No oropharyngeal exudate.  Eyes: Conjunctivae are normal. Pupils are equal,  round, and reactive to light. Right eye exhibits no discharge. Left eye exhibits no discharge. No scleral icterus.  Neck: Normal range of motion. Neck supple. No tracheal deviation present. No thyromegaly present.  Cardiovascular: Normal rate, regular rhythm, normal heart sounds and intact distal pulses.  Exam  reveals no gallop and no friction rub.   No murmur heard. Pulmonary/Chest: Effort normal. No accessory muscle usage. Not tachypneic. No respiratory distress. She has no decreased breath sounds. She has wheezes. She has rhonchi (scattered). She has no rales. She exhibits no tenderness.  Musculoskeletal: Normal range of motion. She exhibits no edema and no tenderness.  Lymphadenopathy:    She has no cervical adenopathy.  Neurological: She is alert and oriented to person, place, and time. No cranial nerve deficit. She exhibits normal muscle tone. Coordination normal.  Skin: Skin is warm and dry. No rash noted. She is not diaphoretic. No erythema. No pallor.  Psychiatric: She has a normal mood and affect. Her behavior is normal. Judgment and thought content normal.          Assessment & Plan:   Problem List Items Addressed This Visit   Acute bronchitis - Primary     Symptoms and exam are most consistent with acute bronchitis. Will treat with Levaquin and prednisone taper. Will start Tussionex as needed for cough. Plan for recheck in one week or sooner as needed if symptoms are not improving.    Relevant Medications      predniSONE (DELTASONE) tablet      levofloxacin (LEVAQUIN) tablet      chlorpheniramine-HYDROcodone (TUSSIONEX) suspension 8-10 mg/44mL       Return in about 1 week (around 02/09/2014) for Recheck.

## 2014-02-02 NOTE — Assessment & Plan Note (Signed)
Symptoms and exam are most consistent with acute bronchitis. Will treat with Levaquin and prednisone taper. Will start Tussionex as needed for cough. Plan for recheck in one week or sooner as needed if symptoms are not improving.

## 2014-02-02 NOTE — Progress Notes (Signed)
Pre visit review using our clinic review tool, if applicable. No additional management support is needed unless otherwise documented below in the visit note. 

## 2014-02-09 ENCOUNTER — Encounter: Payer: Self-pay | Admitting: Emergency Medicine

## 2014-02-18 ENCOUNTER — Telehealth: Payer: Self-pay | Admitting: Internal Medicine

## 2014-02-18 ENCOUNTER — Encounter: Payer: Self-pay | Admitting: Internal Medicine

## 2014-02-18 ENCOUNTER — Ambulatory Visit (INDEPENDENT_AMBULATORY_CARE_PROVIDER_SITE_OTHER): Payer: Commercial Managed Care - HMO | Admitting: Internal Medicine

## 2014-02-18 VITALS — BP 110/70 | HR 60 | Temp 98.6°F | Wt 215.0 lb

## 2014-02-18 DIAGNOSIS — J209 Acute bronchitis, unspecified: Secondary | ICD-10-CM

## 2014-02-18 DIAGNOSIS — B9789 Other viral agents as the cause of diseases classified elsewhere: Principal | ICD-10-CM

## 2014-02-18 DIAGNOSIS — J069 Acute upper respiratory infection, unspecified: Secondary | ICD-10-CM | POA: Insufficient documentation

## 2014-02-18 MED ORDER — PREDNISONE 10 MG PO TABS: ORAL_TABLET | ORAL | Status: DC

## 2014-02-18 MED ORDER — LEVOFLOXACIN 500 MG PO TABS: 500.0000 mg | ORAL_TABLET | Freq: Every day | ORAL | Status: DC

## 2014-02-18 NOTE — Progress Notes (Signed)
Subjective:    Patient ID: Erin Camacho, female    DOB: 1946-03-30, 68 y.o.   MRN: [id number]  HPI 68YO female presents for acute visit.  Recently seen for acute bronchitis. Took last Levaquin and Prednisone on Monday. Developed scratchy throat yesterday with cough.  Several friends have been sick. Hasn't taken any medication. No myalgia or fever at home.  Review of Systems  Constitutional: Positive for fatigue. Negative for fever, chills, appetite change and unexpected weight change.  HENT: Positive for congestion, postnasal drip, rhinorrhea and sore throat. Negative for ear pain, sinus pressure, trouble swallowing and voice change.   Eyes: Negative for visual disturbance.  Respiratory: Positive for cough and shortness of breath. Negative for wheezing and stridor.   Cardiovascular: Negative for chest pain, palpitations and leg swelling.  Gastrointestinal: Negative for nausea, vomiting, abdominal pain, diarrhea, constipation, blood in stool, abdominal distention and anal bleeding.  Genitourinary: Negative for dysuria and flank pain.  Musculoskeletal: Negative for arthralgias, gait problem, myalgias and neck pain.  Skin: Negative for color change and rash.  Neurological: Negative for dizziness and headaches.  Hematological: Negative for adenopathy. Does not bruise/bleed easily.  Psychiatric/Behavioral: Positive for sleep disturbance.       Objective:    BP 110/70  Pulse 60  Temp(Src) 98.6 F (37 C) (Oral)  Wt 215 lb (97.523 kg)  SpO2 96% Physical Exam  Constitutional: She is oriented to person, place, and time. She appears well-developed and well-nourished. No distress.  HENT:  Head: Normocephalic and atraumatic.  Right Ear: Tympanic membrane and external ear normal.  Left Ear: Tympanic membrane and external ear normal.  Nose: Mucosal edema and rhinorrhea present.  Mouth/Throat: Oropharynx is clear and moist. No oropharyngeal exudate or posterior oropharyngeal edema.  Eyes:  Conjunctivae are normal. Pupils are equal, round, and reactive to light. Right eye exhibits no discharge. Left eye exhibits no discharge. No scleral icterus.  Neck: Normal range of motion. Neck supple. No tracheal deviation present. No thyromegaly present.  Cardiovascular: Normal rate, regular rhythm, normal heart sounds and intact distal pulses.  Exam reveals no gallop and no friction rub.   No murmur heard. Pulmonary/Chest: Effort normal and breath sounds normal. No accessory muscle usage. Not tachypneic. No respiratory distress. She has no decreased breath sounds. She has no wheezes. She has no rhonchi. She has no rales. She exhibits no tenderness.  Musculoskeletal: Normal range of motion. She exhibits no edema and no tenderness.  Lymphadenopathy:    She has no cervical adenopathy.  Neurological: She is alert and oriented to person, place, and time. No cranial nerve deficit. She exhibits normal muscle tone. Coordination normal.  Skin: Skin is warm and dry. No rash noted. She is not diaphoretic. No erythema. No pallor.  Psychiatric: She has a normal mood and affect. Her behavior is normal. Judgment and thought content normal.          Assessment & Plan:   Problem List Items Addressed This Visit   Acute bronchitis   Viral URI with cough - Primary     Symptoms and exam are most consistent with viral URI. Encouraged rest, symptom control with mucinex, prn tylenol or ibuprofen and Tussionex prn cough. However, given this is Holiday weekend, and she has long history of recurrent bronchitis, will call in Prednisone taper and Levaquin for her to start if symptoms are worsening, with dyspnea, wheezing. Follow up 1 week.        Return in about 1 week (around 02/25/2014) for Recheck.

## 2014-02-18 NOTE — Progress Notes (Signed)
Pre visit review using our clinic review tool, if applicable. No additional management support is needed unless otherwise documented below in the visit note. 

## 2014-02-18 NOTE — Telephone Encounter (Signed)
Fwd to Dr. Walker 

## 2014-02-18 NOTE — Telephone Encounter (Signed)
Erin Camacho just opened spots on Tue and Wed in afternoon. Feel free to use those

## 2014-02-18 NOTE — Assessment & Plan Note (Signed)
Symptoms and exam are most consistent with viral URI. Encouraged rest, symptom control with mucinex, prn tylenol or ibuprofen and Tussionex prn cough. However, given this is Holiday weekend, and she has long history of recurrent bronchitis, will call in Prednisone taper and Levaquin for her to start if symptoms are worsening, with dyspnea, wheezing. Follow up 1 week.

## 2014-02-18 NOTE — Telephone Encounter (Signed)
appointmetn 4/8 pt aware

## 2014-02-18 NOTE — Telephone Encounter (Signed)
Pt needed 1 week follow up please advise  Appointment date and time  No opening next week thanks

## 2014-02-18 NOTE — Patient Instructions (Signed)
Continue supportive care with Mucinex, Benadryl, albuterol as needed. Tylenol or Ibuprofen for pain or fever.  Rest.   Email if symptoms are worsening with shortness or breath or wheezing. If symptoms persistent or worsening over the weekend, then we will start Levaquin and Prednisone taper.  Recheck next week.

## 2014-02-19 ENCOUNTER — Encounter: Payer: Self-pay | Admitting: Internal Medicine

## 2014-02-24 ENCOUNTER — Encounter: Payer: Self-pay | Admitting: Internal Medicine

## 2014-02-24 ENCOUNTER — Ambulatory Visit (INDEPENDENT_AMBULATORY_CARE_PROVIDER_SITE_OTHER): Payer: Commercial Managed Care - HMO | Admitting: Internal Medicine

## 2014-02-24 VITALS — BP 100/60 | HR 66 | Temp 98.8°F | Wt 215.0 lb

## 2014-02-24 DIAGNOSIS — G8929 Other chronic pain: Secondary | ICD-10-CM | POA: Insufficient documentation

## 2014-02-24 DIAGNOSIS — M25512 Pain in left shoulder: Secondary | ICD-10-CM

## 2014-02-24 DIAGNOSIS — M25511 Pain in right shoulder: Secondary | ICD-10-CM

## 2014-02-24 DIAGNOSIS — M25519 Pain in unspecified shoulder: Secondary | ICD-10-CM

## 2014-02-24 DIAGNOSIS — J209 Acute bronchitis, unspecified: Secondary | ICD-10-CM

## 2014-02-24 NOTE — Assessment & Plan Note (Addendum)
Symptoms have resolved. Exam is normal. She will finish last dose of Levaquin. Follow up if any recurrent symptoms, dyspnea, cough, fever.

## 2014-02-24 NOTE — Progress Notes (Signed)
Subjective:    Patient ID: Erin Camacho, female    DOB: January 19, 1946, 68 y.o.   MRN: [id number]  HPI 68YO female presents to follow up recent bronchitis. Completed course of levaquin and prednisone.  Concerned about bilateral shoulder and upper arm pain. Started prior to recent bronchitis. May be related to recent activity sweeping her porch. No trauma to neck or shoulders. No weakness.  Review of Systems  Constitutional: Negative for fever, chills, appetite change, fatigue and unexpected weight change.  HENT: Negative for congestion, ear pain, sinus pressure, sore throat, trouble swallowing and voice change.   Eyes: Negative for visual disturbance.  Respiratory: Negative for cough, shortness of breath, wheezing and stridor.   Cardiovascular: Negative for chest pain, palpitations and leg swelling.  Gastrointestinal: Negative for nausea, vomiting, abdominal pain, diarrhea, constipation, blood in stool, abdominal distention and anal bleeding.  Genitourinary: Negative for dysuria and flank pain.  Musculoskeletal: Positive for myalgias. Negative for arthralgias, gait problem and neck pain.  Skin: Negative for color change and rash.  Neurological: Negative for dizziness and headaches.  Hematological: Negative for adenopathy. Does not bruise/bleed easily.  Psychiatric/Behavioral: Negative for suicidal ideas, sleep disturbance and dysphoric mood. The patient is not nervous/anxious.        Objective:    BP 100/60  Pulse 66  Temp(Src) 98.8 F (37.1 C) (Oral)  Wt 215 lb (97.523 kg)  SpO2 97% Physical Exam  Constitutional: She is oriented to person, place, and time. She appears well-developed and well-nourished. No distress.  HENT:  Head: Normocephalic and atraumatic.  Right Ear: External ear normal.  Left Ear: External ear normal.  Nose: Nose normal.  Mouth/Throat: Oropharynx is clear and moist. No oropharyngeal exudate.  Eyes: Conjunctivae are normal. Pupils are equal, round, and  reactive to light. Right eye exhibits no discharge. Left eye exhibits no discharge. No scleral icterus.  Neck: Normal range of motion. Neck supple. No tracheal deviation present. No thyromegaly present.  Cardiovascular: Normal rate, regular rhythm, normal heart sounds and intact distal pulses.  Exam reveals no gallop and no friction rub.   No murmur heard. Pulmonary/Chest: Effort normal and breath sounds normal. No accessory muscle usage. Not tachypneic. No respiratory distress. She has no decreased breath sounds. She has no wheezes. She has no rhonchi. She has no rales. She exhibits no tenderness.  Musculoskeletal: Normal range of motion. She exhibits no edema.       Right upper arm: She exhibits tenderness. She exhibits no bony tenderness and no swelling.       Left upper arm: She exhibits tenderness. She exhibits no bony tenderness and no swelling.  Lymphadenopathy:    She has no cervical adenopathy.  Neurological: She is alert and oriented to person, place, and time. No cranial nerve deficit. She exhibits normal muscle tone. Coordination normal.  Skin: Skin is warm and dry. No rash noted. She is not diaphoretic. No erythema. No pallor.  Psychiatric: She has a normal mood and affect. Her behavior is normal. Judgment and thought content normal.          Assessment & Plan:   Problem List Items Addressed This Visit   Acute bronchitis - Primary     Symptoms have resolved. Exam is normal. She will finish last dose of Levaquin. Follow up if any recurrent symptoms, dyspnea, cough, fever.    Bilateral shoulder pain     Exam is normal except for mild tenderness over upper arms bilaterally. Symptoms most likely secondary to recent activity sweeping  her porch. Discussed risks of Levaquin and tendon rupture. Discussed evaluation for PMR if symptoms persist. She will monitor symptoms and call if persistent.        No Follow-up on file.

## 2014-02-24 NOTE — Progress Notes (Signed)
Pre visit review using our clinic review tool, if applicable. No additional management support is needed unless otherwise documented below in the visit note. 

## 2014-02-24 NOTE — Assessment & Plan Note (Signed)
Exam is normal except for mild tenderness over upper arms bilaterally. Symptoms most likely secondary to recent activity sweeping her porch. Discussed risks of Levaquin and tendon rupture. Discussed evaluation for PMR if symptoms persist. She will monitor symptoms and call if persistent.

## 2014-03-15 ENCOUNTER — Encounter: Payer: Self-pay | Admitting: Internal Medicine

## 2014-03-15 DIAGNOSIS — M25519 Pain in unspecified shoulder: Secondary | ICD-10-CM

## 2014-03-24 ENCOUNTER — Encounter: Payer: Self-pay | Admitting: Internal Medicine

## 2014-04-26 ENCOUNTER — Encounter: Payer: Self-pay | Admitting: Adult Health

## 2014-04-26 ENCOUNTER — Other Ambulatory Visit: Payer: Self-pay | Admitting: Adult Health

## 2014-04-26 ENCOUNTER — Ambulatory Visit (INDEPENDENT_AMBULATORY_CARE_PROVIDER_SITE_OTHER): Payer: Commercial Managed Care - HMO | Admitting: Adult Health

## 2014-04-26 ENCOUNTER — Ambulatory Visit (INDEPENDENT_AMBULATORY_CARE_PROVIDER_SITE_OTHER)
Admission: RE | Admit: 2014-04-26 | Discharge: 2014-04-26 | Disposition: A | Discharge status: Home / Self Care | Payer: Commercial Managed Care - HMO | Source: Ambulatory Visit | Attending: Adult Health | Admitting: Adult Health

## 2014-04-26 VITALS — BP 164/84 | HR 54 | Temp 98.5°F | Resp 14 | Ht 65.5 in | Wt 220.2 lb

## 2014-04-26 DIAGNOSIS — M79604 Pain in right leg: Secondary | ICD-10-CM

## 2014-04-26 DIAGNOSIS — M79609 Pain in unspecified limb: Secondary | ICD-10-CM

## 2014-04-26 MED ORDER — BACLOFEN 10 MG PO TABS: 10.0000 mg | ORAL_TABLET | Freq: Three times a day (TID) | ORAL | Status: DC

## 2014-04-26 NOTE — Patient Instructions (Addendum)
  Please go to the [facility] office for an xray of the lumbar spine.  Continue anti-inflammatory medication prescribed by Dr. Clydell Hakim office. Do not take any aleve, ibuprofen, aspirin or any other anti-inflammatory while you are taking the diclofenac.  Also take baclofen for muscle spasms. This will make you sleepy so only take this at bedtime.  Apply ice alternating with heat to the affected areas for 15 min at a time. Do this approximately 3-4 times daily.  Use a firm pillow between your knees when you lie on your side or under your knees when you lie on your back.  Call if your symptoms are not improving within 2 weeks or sooner if your symptoms worsen. I will refer you to Dr. Sharlet Salina if pain not improved.

## 2014-04-26 NOTE — Progress Notes (Signed)
Pre visit review using our clinic review tool, if applicable. No additional management support is needed unless otherwise documented below in the visit note. 

## 2014-04-26 NOTE — Progress Notes (Signed)
Patient ID: Erin Camacho, female   DOB: [birth year], 68 y.o.   MRN: [id number]   Subjective:    Patient ID: Erin Camacho, female    DOB: December 03, 1945, 68 y.o.   MRN: [id number]  HPI Presents with pain posterior buttock and right thigh with movement.  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 Saw Dr. Marry Guan for follow up right hip. She was informed possibly had nerve damage from back surgery (?). She has posterior right upper leg pain that wakes her at night. Pain ongoing x 2 weeks.  Describes pain as sharpe with movement. Does not hurt during the day (only occurs at night). Does not recall any injury. "Pain started out of the blue". No swelling. No tingling or numbness. She spends a considerable amount of time sitting   Past Medical History  Diagnosis Date  . Arthritis   . GERD (gastroesophageal reflux disease)   . Allergy     hay fever  . Hypertension   . Hyperlipidemia   . UTI (lower urinary tract infection)   . Spinal stenosis   . Chronic back pain   . Pneumonia   . Diabetes mellitus     no meds      pt states she is not diabetic    Current Outpatient Prescriptions on File Prior to Visit  Medication Sig Dispense Refill  . ALPRAZolam (XANAX) 0.25 MG tablet Take 0.25 mg by mouth at bedtime as needed for sleep.       . chlorpheniramine-HYDROcodone (TUSSIONEX) 10-8 MG/5ML LQCR Take 5 mLs by mouth every 12 (twelve) hours as needed for cough.  140 mL  0  . Cholecalciferol (VITAMIN D-3) 1000 UNITS CAPS Take 1 capsule by mouth daily.      . fexofenadine (ALLEGRA) 180 MG tablet Take 1 tablet (180 mg total) by mouth daily.  90 tablet  4  . fluticasone (FLONASE) 50 MCG/ACT nasal spray Place 2 sprays into both nostrils daily.  48 g  3  . levofloxacin (LEVAQUIN) 500 MG tablet Take 1 tablet (500 mg total) by mouth daily.  7 tablet  0  . losartan-hydrochlorothiazide (HYZAAR) 50-12.5 MG per tablet Take 1 tablet by mouth daily.  90 tablet  3  . metoprolol succinate (TOPROL  XL) 25 MG 24 hr tablet Take 1 tablet (25 mg total) by mouth daily.  90 tablet  3  . Multiple Vitamin (MULTIVITAMIN) tablet Take 1 tablet by mouth daily.      Marland Kitchen omeprazole (PRILOSEC) 20 MG capsule Take 1 capsule (20 mg total) by mouth 2 (two) times daily before a meal.  180 capsule  3  . sertraline (ZOLOFT) 100 MG tablet Take 1 tablet (100 mg total) by mouth daily.  90 tablet  3  . simvastatin (ZOCOR) 10 MG tablet Take 0.5 tablets (5 mg total) by mouth at bedtime.  90 tablet  3  . traZODone (DESYREL) 100 MG tablet Take 1 tablet (100 mg total) by mouth at bedtime.  90 tablet  3  . albuterol (PROVENTIL) (2.5 MG/3ML) 0.083% nebulizer solution Take 3 mLs (2.5 mg total) by nebulization every 6 (six) hours as needed for wheezing. Please disp with nebulizer machine  150 mL  1   No current facility-administered medications on file prior to visit.     Review of Systems  Gastrointestinal: Negative.   Genitourinary: Negative.   Musculoskeletal: Negative for back pain.       Posterior right buttock and thigh pain with movement. Occurs as  night  Neurological: Negative for weakness and numbness.  All other systems reviewed and are negative.      Objective:  BP 164/84  Pulse 54  Temp(Src) 98.5 F (36.9 C) (Oral)  Resp 14  Wt 220 lb 4 oz (99.905 kg)  SpO2 95%   Physical Exam  Constitutional: She is oriented to person, place, and time. No distress.  HENT:  Head: Normocephalic and atraumatic.  Eyes: Conjunctivae and EOM are normal.  Neck: Normal range of motion. Neck supple.  Cardiovascular: Normal rate and regular rhythm.   Pulmonary/Chest: Effort normal. No respiratory distress.  Musculoskeletal: Normal range of motion. She exhibits no edema and no tenderness.  Not experiencing pain at the moment  Neurological: She is alert and oriented to person, place, and time. Coordination normal.  Skin: Skin is warm and dry.  Psychiatric: She has a normal mood and affect. Her behavior is normal.  Judgment and thought content normal.      Assessment & Plan:   1. Pain of right leg Suspect sciatic nerve pain, piriformis syndrome. Discussed exercises/stretches to release piriformis. Ice x 3-4 times daily. She was prescribed diclofenac by Dr. Marry Guan. Continue. Added baclofen for short term muscle relaxer. Firm pillow below knees while sleeping and between knees if turns on her side. I am sending her to xray of lumbar spine to make certain no problems related to recent surgery. If no improvement within 2 weeks consider referral to Dr. Sharlet Salina.

## 2014-05-19 ENCOUNTER — Telehealth: Payer: Self-pay | Admitting: Adult Health

## 2014-05-19 NOTE — Telephone Encounter (Signed)
The patient leg is not any better. She stated at her last visit with Raquel that if she was not any better she would refer her to Dr. Sharlet Salina .

## 2014-05-19 NOTE — Telephone Encounter (Signed)
Patient is requesting a referral to see Dr. Sharlet Salina. At last visit patient was told if leg wasn't getting any better a referral would be placed. Patient report leg is not any better. Please advise.

## 2014-05-20 ENCOUNTER — Other Ambulatory Visit: Payer: Self-pay | Admitting: Adult Health

## 2014-05-20 DIAGNOSIS — M25569 Pain in unspecified knee: Secondary | ICD-10-CM

## 2014-05-26 ENCOUNTER — Encounter: Payer: Self-pay | Admitting: Internal Medicine

## 2014-05-26 ENCOUNTER — Telehealth: Payer: Self-pay | Admitting: *Deleted

## 2014-05-26 NOTE — Telephone Encounter (Signed)
Pt sent MyChart message in reference to her pain management referral. Please contact pt with an update of this appt

## 2014-05-27 NOTE — Telephone Encounter (Signed)
Called pt, no answer, left message letting her know her ref was sent in and we are waiting on an apt date and time from the clinic.

## 2014-05-31 NOTE — Telephone Encounter (Signed)
error 

## 2014-06-28 DIAGNOSIS — M5116 Intervertebral disc disorders with radiculopathy, lumbar region: Secondary | ICD-10-CM | POA: Insufficient documentation

## 2014-06-30 ENCOUNTER — Ambulatory Visit: Payer: Self-pay | Admitting: Internal Medicine

## 2014-06-30 ENCOUNTER — Encounter: Payer: Self-pay | Admitting: *Deleted

## 2014-06-30 LAB — HM MAMMOGRAPHY: HM Mammogram: NEGATIVE

## 2014-07-08 ENCOUNTER — Ambulatory Visit: Payer: Self-pay | Admitting: Physical Medicine and Rehabilitation

## 2014-07-16 ENCOUNTER — Encounter: Payer: Self-pay | Admitting: Internal Medicine

## 2014-07-27 ENCOUNTER — Encounter: Payer: Self-pay | Admitting: Internal Medicine

## 2014-07-27 ENCOUNTER — Ambulatory Visit (INDEPENDENT_AMBULATORY_CARE_PROVIDER_SITE_OTHER): Payer: Commercial Managed Care - HMO | Admitting: Internal Medicine

## 2014-07-27 VITALS — BP 130/72 | HR 52 | Temp 98.6°F | Ht 65.5 in | Wt 225.0 lb

## 2014-07-27 DIAGNOSIS — M79609 Pain in unspecified limb: Secondary | ICD-10-CM

## 2014-07-27 DIAGNOSIS — Z23 Encounter for immunization: Secondary | ICD-10-CM

## 2014-07-27 DIAGNOSIS — E669 Obesity, unspecified: Secondary | ICD-10-CM

## 2014-07-27 DIAGNOSIS — I10 Essential (primary) hypertension: Secondary | ICD-10-CM

## 2014-07-27 DIAGNOSIS — M79604 Pain in right leg: Secondary | ICD-10-CM | POA: Insufficient documentation

## 2014-07-27 DIAGNOSIS — R61 Generalized hyperhidrosis: Secondary | ICD-10-CM | POA: Insufficient documentation

## 2014-07-27 LAB — POCT URINALYSIS DIPSTICK
Bilirubin, UA: NEGATIVE
Blood, UA: NEGATIVE
Glucose, UA: NEGATIVE
Nitrite, UA: NEGATIVE
Protein, UA: NEGATIVE
Spec Grav, UA: 1.015
Urobilinogen, UA: 2
pH, UA: 7.5

## 2014-07-27 LAB — CBC WITH DIFFERENTIAL/PLATELET
Basophils Absolute: 0 10*3/uL (ref 0.0–0.1)
Basophils Relative: 0.5 % (ref 0.0–3.0)
Eosinophils Absolute: 0.2 10*3/uL (ref 0.0–0.7)
Eosinophils Relative: 2.7 % (ref 0.0–5.0)
HCT: 39.8 % (ref 36.0–46.0)
Hemoglobin: 13.3 g/dL (ref 12.0–15.0)
Lymphocytes Relative: 28.3 % (ref 12.0–46.0)
Lymphs Abs: 1.6 10*3/uL (ref 0.7–4.0)
MCHC: 33.5 g/dL (ref 30.0–36.0)
MCV: 91.9 fl (ref 78.0–100.0)
Monocytes Absolute: 0.6 10*3/uL (ref 0.1–1.0)
Monocytes Relative: 10.8 % (ref 3.0–12.0)
Neutro Abs: 3.3 10*3/uL (ref 1.4–7.7)
Neutrophils Relative %: 57.7 % (ref 43.0–77.0)
Platelets: 179 10*3/uL (ref 150.0–400.0)
RBC: 4.32 Mil/uL (ref 3.87–5.11)
RDW: 14.3 % (ref 11.5–15.5)
WBC: 5.7 10*3/uL (ref 4.0–10.5)

## 2014-07-27 LAB — COMPREHENSIVE METABOLIC PANEL WITH GFR
ALT: 16 U/L (ref 0–35)
AST: 18 U/L (ref 0–37)
Albumin: 4.2 g/dL (ref 3.5–5.2)
Alkaline Phosphatase: 64 U/L (ref 39–117)
BUN: 21 mg/dL (ref 6–23)
CO2: 31 meq/L (ref 19–32)
Calcium: 10.1 mg/dL (ref 8.4–10.5)
Chloride: 101 meq/L (ref 96–112)
Creatinine, Ser: 1 mg/dL (ref 0.4–1.2)
GFR: 58.39 mL/min — ABNORMAL LOW
Glucose, Bld: 105 mg/dL — ABNORMAL HIGH (ref 70–99)
Potassium: 4.4 meq/L (ref 3.5–5.1)
Sodium: 139 meq/L (ref 135–145)
Total Bilirubin: 0.8 mg/dL (ref 0.2–1.2)
Total Protein: 7.2 g/dL (ref 6.0–8.3)

## 2014-07-27 LAB — TSH: TSH: 1.46 u[IU]/mL (ref 0.35–4.50)

## 2014-07-27 LAB — VITAMIN B12: Vitamin B-12: >1500 pg/mL — ABNORMAL HIGH (ref 211–911)

## 2014-07-27 LAB — HEMOGLOBIN A1C: Hgb A1c MFr Bld: 6.2 % (ref 4.6–6.5)

## 2014-07-27 NOTE — Assessment & Plan Note (Signed)
We discussed potential causes of diaphoresis, including menopause, thyroid dysfunction, and recent steroid injection. Will check CBC, CMP, TSH with labs today.

## 2014-07-27 NOTE — Assessment & Plan Note (Signed)
Wt Readings from Last 3 Encounters:  07/27/14 225 lb (102.059 kg)  04/26/14 220 lb 4 oz (99.905 kg)  02/24/14 215 lb (97.523 kg)   Encouraged healthy diet and exercise with goal of weight loss.

## 2014-07-27 NOTE — Assessment & Plan Note (Signed)
Persistent right leg pain. Reviewed notes from Dr. Sharlet Salina and MRI lumbar spine. Plan for local steroid injection next week. Follow up here in 4 weeks.

## 2014-07-27 NOTE — Assessment & Plan Note (Signed)
BP Readings from Last 3 Encounters:  07/27/14 130/72  04/26/14 164/84  02/24/14 100/60   BP well controlled on current medication. Will continue. Renal function with labs today.

## 2014-07-27 NOTE — Progress Notes (Signed)
Subjective:    Patient ID: Erin Camacho, female    DOB: 1946-06-23, 68 y.o.   MRN: [id number]  HPI 68YO female presents for follow up.  Right leg pain - Started in June 2015. Had MRI and follow up with Dr. Sharlet Salina. Scheduled for injection this week. Continues on Tramadol with minimal improvement in pain symptoms.  Diaphoresis - Occurs frequently over base of neck and face. Feeling tired. Occuring over last 3-6 months. No chest pain, dyspnea, nausea.  She notes some dietary indiscretion recently with increased intake of sweets. Has not been exercising because of leg and back pain.  Review of Systems  Constitutional: Positive for diaphoresis and fatigue. Negative for fever, chills, appetite change and unexpected weight change.  Eyes: Negative for visual disturbance.  Respiratory: Negative for cough and shortness of breath.   Cardiovascular: Negative for chest pain and leg swelling.  Gastrointestinal: Negative for abdominal pain, diarrhea and constipation.  Musculoskeletal: Positive for arthralgias, back pain and myalgias. Negative for joint swelling.  Skin: Negative for color change and rash.  Neurological: Negative for weakness and numbness.  Hematological: Negative for adenopathy. Does not bruise/bleed easily.  Psychiatric/Behavioral: Negative for dysphoric mood. The patient is not nervous/anxious.        Objective:    BP 130/72  Pulse 52  Temp(Src) 98.6 F (37 C) (Oral)  Ht 5' 5.5" (1.664 m)  Wt 225 lb (102.059 kg)  BMI 36.86 kg/m2  SpO2 97% Physical Exam  Constitutional: She is oriented to person, place, and time. She appears well-developed and well-nourished. No distress.  HENT:  Head: Normocephalic and atraumatic.  Right Ear: External ear normal.  Left Ear: External ear normal.  Nose: Nose normal.  Mouth/Throat: Oropharynx is clear and moist. No oropharyngeal exudate.  Eyes: Conjunctivae are normal. Pupils are equal, round, and reactive to light. Right eye exhibits no  discharge. Left eye exhibits no discharge. No scleral icterus.  Neck: Normal range of motion. Neck supple. No tracheal deviation present. No thyromegaly present.  Cardiovascular: Normal rate, regular rhythm, normal heart sounds and intact distal pulses.  Exam reveals no gallop and no friction rub.   No murmur heard. Pulmonary/Chest: Effort normal and breath sounds normal. No accessory muscle usage. Not tachypneic. No respiratory distress. She has no decreased breath sounds. She has no wheezes. She has no rhonchi. She has no rales. She exhibits no tenderness.  Musculoskeletal: Normal range of motion. She exhibits no edema and no tenderness.  Lymphadenopathy:    She has no cervical adenopathy.  Neurological: She is alert and oriented to person, place, and time. No cranial nerve deficit. She exhibits normal muscle tone. Coordination normal.  Skin: Skin is warm and dry. No rash noted. She is not diaphoretic. No erythema. No pallor.  Psychiatric: She has a normal mood and affect. Her behavior is normal. Judgment and thought content normal.          Assessment & Plan:   Problem List Items Addressed This Visit     Unprioritized   Diaphoresis - Primary     We discussed potential causes of diaphoresis, including menopause, thyroid dysfunction, and recent steroid injection. Will check CBC, CMP, TSH with labs today.    Relevant Orders      CBC with Differential      TSH      Hemoglobin A1c      POCT Urinalysis Dipstick (Completed)      Comprehensive metabolic panel      G26   Hypertension  BP Readings from Last 3 Encounters:  07/27/14 130/72  04/26/14 164/84  02/24/14 100/60   BP well controlled on current medication. Will continue. Renal function with labs today.    Obesity (BMI 30-39.9)      Wt Readings from Last 3 Encounters:  07/27/14 225 lb (102.059 kg)  04/26/14 220 lb 4 oz (99.905 kg)  02/24/14 215 lb (97.523 kg)   Encouraged healthy diet and exercise with goal of  weight loss.    Right leg pain     Persistent right leg pain. Reviewed notes from Dr. Sharlet Salina and MRI lumbar spine. Plan for local steroid injection next week. Follow up here in 4 weeks.      Other Visit Diagnoses   Need for prophylactic vaccination and inoculation against influenza        Relevant Orders       Flu Vaccine QUAD 36+ mos PF IM (Fluarix Quad PF) (Completed)        Return in about 4 weeks (around 08/24/2014).

## 2014-07-27 NOTE — Patient Instructions (Signed)
Labs today.  Follow up in 4 weeks. 

## 2014-07-27 NOTE — Progress Notes (Signed)
Pre visit review using our clinic review tool, if applicable. No additional management support is needed unless otherwise documented below in the visit note. 

## 2014-07-27 NOTE — Addendum Note (Signed)
Addended by: Karlene Einstein D on: 07/27/2014 01:39 PM   Modules accepted: Orders

## 2014-07-30 LAB — CULTURE, URINE COMPREHENSIVE: Colony Count: >=100000

## 2014-07-30 MED ORDER — LEVOFLOXACIN 500 MG PO TABS: 500.0000 mg | ORAL_TABLET | Freq: Every day | ORAL | Status: DC

## 2014-07-30 NOTE — Addendum Note (Signed)
Addended by: Ronette Deter A on: 07/30/2014 12:28 PM   Modules accepted: Orders

## 2014-08-01 ENCOUNTER — Encounter: Payer: Self-pay | Admitting: Internal Medicine

## 2014-08-27 ENCOUNTER — Encounter: Payer: Self-pay | Admitting: Internal Medicine

## 2014-08-27 ENCOUNTER — Ambulatory Visit (INDEPENDENT_AMBULATORY_CARE_PROVIDER_SITE_OTHER): Payer: Commercial Managed Care - HMO | Admitting: Internal Medicine

## 2014-08-27 VITALS — BP 118/64 | HR 66 | Temp 98.3°F | Ht 65.5 in | Wt 205.8 lb

## 2014-08-27 DIAGNOSIS — R61 Generalized hyperhidrosis: Secondary | ICD-10-CM

## 2014-08-27 DIAGNOSIS — I10 Essential (primary) hypertension: Secondary | ICD-10-CM

## 2014-08-27 DIAGNOSIS — M79604 Pain in right leg: Secondary | ICD-10-CM

## 2014-08-27 DIAGNOSIS — F329 Major depressive disorder, single episode, unspecified: Secondary | ICD-10-CM

## 2014-08-27 DIAGNOSIS — F32A Depression, unspecified: Secondary | ICD-10-CM

## 2014-08-27 MED ORDER — LIDOCAINE 5 % EX PTCH: 1.0000 | MEDICATED_PATCH | CUTANEOUS | Status: DC

## 2014-08-27 MED ORDER — CYCLOBENZAPRINE HCL 5 MG PO TABS: 5.0000 mg | ORAL_TABLET | Freq: Three times a day (TID) | ORAL | Status: DC | PRN

## 2014-08-27 MED ORDER — SERTRALINE HCL 100 MG PO TABS: 50.0000 mg | ORAL_TABLET | Freq: Every day | ORAL | Status: DC

## 2014-08-27 NOTE — Assessment & Plan Note (Signed)
BP Readings from Last 3 Encounters:  08/27/14 118/64  07/27/14 130/72  04/26/14 164/84   BP well controlled on current medication. Will continue.

## 2014-08-27 NOTE — Assessment & Plan Note (Signed)
Improvement in right leg pain after ESI with Dr. Sharlet Salina. Follow up with Dr. Sharlet Salina as scheduled.

## 2014-08-27 NOTE — Assessment & Plan Note (Signed)
Persistent intermittent diaphoresis. Lab evaluation was normal. Suspect that recent steroid injections have contributed. EKG unchanged from 12/2012. Will request notes on previous ECHO and stress test.

## 2014-08-27 NOTE — Progress Notes (Signed)
Subjective:    Patient ID: Erin Camacho, female    DOB: [birth year], 68 y.o.   MRN: [id number]  HPI 68YO female presents for follow up.  Recently evaluated for diaphoresis.  Symptoms have improved however continues to have some diaphoresis with exertion. No chest pain or nausea. Mild dyspnea with exertion. Few palpitations on occasion at night. Had a stress test and ECHO in the past which were reportedly normal.  Seen by Dr. Sharlet Salina and had steroid injection with improvement in right lower back and leg pain, but continues to have some aching back pain. Notes some numbness in bilateral thighs. No loss of control of bowel or bladder. No weakness or numbness.    Review of Systems  Constitutional: Positive for diaphoresis. Negative for fever, chills, appetite change, fatigue and unexpected weight change.  Eyes: Negative for visual disturbance.  Respiratory: Positive for shortness of breath (with exertion). Negative for cough and wheezing.   Cardiovascular: Positive for palpitations. Negative for chest pain and leg swelling.  Gastrointestinal: Negative for nausea, vomiting, abdominal pain, diarrhea and constipation.  Musculoskeletal: Positive for arthralgias, back pain and myalgias.  Skin: Negative for color change and rash.  Neurological: Negative for weakness and numbness.  Hematological: Negative for adenopathy. Does not bruise/bleed easily.  Psychiatric/Behavioral: Negative for dysphoric mood. The patient is not nervous/anxious.        Objective:    BP 118/64  Pulse 66  Temp(Src) 98.3 F (36.8 C) (Oral)  Ht 5' 5.5" (1.664 m)  Wt 205 lb 12 oz (93.328 kg)  BMI 33.71 kg/m2  SpO2 95% Physical Exam  Constitutional: She is oriented to person, place, and time. She appears well-developed and well-nourished. No distress.  HENT:  Head: Normocephalic and atraumatic.  Right Ear: External ear normal.  Left Ear: External ear normal.  Nose: Nose normal.  Mouth/Throat: Oropharynx is clear  and moist. No oropharyngeal exudate.  Eyes: Conjunctivae are normal. Pupils are equal, round, and reactive to light. Right eye exhibits no discharge. Left eye exhibits no discharge. No scleral icterus.  Neck: Normal range of motion. Neck supple. No tracheal deviation present. No thyromegaly present.  Cardiovascular: Normal rate, regular rhythm, normal heart sounds and intact distal pulses.  Exam reveals no gallop and no friction rub.   No murmur heard. Pulmonary/Chest: Effort normal and breath sounds normal. No accessory muscle usage. Not tachypneic. No respiratory distress. She has no decreased breath sounds. She has no wheezes. She has no rhonchi. She has no rales. She exhibits no tenderness.  Musculoskeletal: Normal range of motion. She exhibits no edema.       Lumbar back: She exhibits tenderness and pain. She exhibits normal range of motion, no bony tenderness and no spasm.  Lymphadenopathy:    She has no cervical adenopathy.  Neurological: She is alert and oriented to person, place, and time. No cranial nerve deficit. She exhibits normal muscle tone. Coordination normal.  Skin: Skin is warm and dry. No rash noted. She is not diaphoretic. No erythema. No pallor.  Psychiatric: She has a normal mood and affect. Her behavior is normal. Judgment and thought content normal.          Assessment & Plan:   Problem List Items Addressed This Visit     Unprioritized   Diaphoresis - Primary     Persistent intermittent diaphoresis. Lab evaluation was normal. Suspect that recent steroid injections have contributed. EKG unchanged from 12/2012. Will request notes on previous ECHO and stress test.    Relevant  Orders      EKG 12-Lead (Completed)   Hypertension      BP Readings from Last 3 Encounters:  08/27/14 118/64  07/27/14 130/72  04/26/14 164/84   BP well controlled on current medication. Will continue.    Major depression     Symptoms well controlled with decrease in Sertraline. Will  continue to monitir.    Relevant Medications      sertraline (ZOLOFT) tablet   Right leg pain     Improvement in right leg pain after ESI with Dr. Sharlet Salina. Follow up with Dr. Sharlet Salina as scheduled.        Return in about 3 months (around 11/27/2014).

## 2014-08-27 NOTE — Progress Notes (Signed)
Pre visit review using our clinic review tool, if applicable. No additional management support is needed unless otherwise documented below in the visit note. 

## 2014-08-27 NOTE — Patient Instructions (Signed)
Follow up in 3 months or sooner as needed. 

## 2014-08-27 NOTE — Assessment & Plan Note (Signed)
Symptoms well controlled with decrease in Sertraline. Will continue to monitir.

## 2014-11-09 ENCOUNTER — Encounter: Payer: Self-pay | Admitting: Internal Medicine

## 2014-11-09 ENCOUNTER — Ambulatory Visit (INDEPENDENT_AMBULATORY_CARE_PROVIDER_SITE_OTHER): Payer: Commercial Managed Care - HMO | Admitting: Internal Medicine

## 2014-11-09 VITALS — BP 133/79 | HR 59 | Temp 98.4°F | Ht 65.5 in | Wt 232.5 lb

## 2014-11-09 DIAGNOSIS — J209 Acute bronchitis, unspecified: Secondary | ICD-10-CM | POA: Insufficient documentation

## 2014-11-09 MED ORDER — LEVOFLOXACIN 500 MG PO TABS: 500.0000 mg | ORAL_TABLET | Freq: Every day | ORAL | Status: DC

## 2014-11-09 MED ORDER — ALPRAZOLAM 0.25 MG PO TABS: 0.2500 mg | ORAL_TABLET | Freq: Every evening | ORAL | Status: DC | PRN

## 2014-11-09 MED ORDER — HYDROCOD POLST-CHLORPHEN POLST 10-8 MG/5ML PO LQCR: 5.0000 mL | Freq: Two times a day (BID) | ORAL | Status: DC | PRN

## 2014-11-09 MED ORDER — PREDNISONE 10 MG PO TABS: ORAL_TABLET | ORAL | Status: DC

## 2014-11-09 NOTE — Progress Notes (Signed)
Pre visit review using our clinic review tool, if applicable. No additional management support is needed unless otherwise documented below in the visit note. 

## 2014-11-09 NOTE — Telephone Encounter (Signed)
Called pt, scheduled for today.

## 2014-11-09 NOTE — Assessment & Plan Note (Signed)
Symptoms and exam consistent with early acute bronchitis. Will start Levaquin and Prednisone taper. Tussionex as needed for cough. Follow up if symptoms are not improving.

## 2014-11-09 NOTE — Patient Instructions (Addendum)
Start Prednisone taper and daily Levaquin to help treat bronchitis.  Use Tussionex as needed for cough.  Follow up if symptoms are not improving and for recheck 2 weeks.

## 2014-11-09 NOTE — Progress Notes (Signed)
   Subjective:    Patient ID: Erin Camacho, female    DOB: 1946/06/13, 68 y.o.   MRN: [id number]  HPI 68YO female presents for acute visit.  Developed cough, wheezing, chest tightness over the last 2-3 days. Feels short of breath. Some cough productive of purulent mucous. No fever noted. Sick contacts at church. Not taking any medication for this.  Past medical, surgical, family and social history per today's encounter.  Review of Systems  Constitutional: Positive for fatigue. Negative for fever, chills, appetite change and unexpected weight change.  HENT: Positive for rhinorrhea. Negative for congestion, ear discharge, ear pain, facial swelling, hearing loss, mouth sores, nosebleeds, postnasal drip, sinus pressure, sneezing, sore throat, tinnitus, trouble swallowing and voice change.   Eyes: Negative for pain, discharge, redness and visual disturbance.  Respiratory: Positive for cough, chest tightness, shortness of breath and wheezing. Negative for stridor.   Cardiovascular: Negative for chest pain, palpitations and leg swelling.  Gastrointestinal: Negative for abdominal pain, diarrhea and constipation.  Musculoskeletal: Negative for myalgias, arthralgias, neck pain and neck stiffness.  Skin: Negative for color change and rash.  Neurological: Negative for dizziness, weakness, light-headedness and headaches.  Hematological: Negative for adenopathy. Does not bruise/bleed easily.  Psychiatric/Behavioral: Negative for dysphoric mood. The patient is not nervous/anxious.        Objective:    BP 133/79 mmHg  Pulse 59  Temp(Src) 98.4 F (36.9 C) (Oral)  Ht 5' 5.5" (1.664 m)  Wt 232 lb 8 oz (105.461 kg)  BMI 38.09 kg/m2  SpO2 96% Physical Exam  Constitutional: She is oriented to person, place, and time. She appears well-developed and well-nourished. No distress.  HENT:  Head: Normocephalic and atraumatic.  Right Ear: External ear normal.  Left Ear: External ear normal.  Nose: Nose  normal.  Mouth/Throat: Oropharynx is clear and moist. No oropharyngeal exudate.  Eyes: Conjunctivae are normal. Pupils are equal, round, and reactive to light. Right eye exhibits no discharge. Left eye exhibits no discharge. No scleral icterus.  Neck: Normal range of motion. Neck supple. No tracheal deviation present. No thyromegaly present.  Cardiovascular: Normal rate, regular rhythm, normal heart sounds and intact distal pulses.  Exam reveals no gallop and no friction rub.   No murmur heard. Pulmonary/Chest: Effort normal. No accessory muscle usage. No tachypnea. No respiratory distress. She has no decreased breath sounds. She has wheezes (scattered). She has rhonchi. She has no rales. She exhibits no tenderness.  Musculoskeletal: Normal range of motion. She exhibits no edema or tenderness.  Lymphadenopathy:    She has no cervical adenopathy.  Neurological: She is alert and oriented to person, place, and time. No cranial nerve deficit. She exhibits normal muscle tone. Coordination normal.  Skin: Skin is warm and dry. No rash noted. She is not diaphoretic. No erythema. No pallor.  Psychiatric: She has a normal mood and affect. Her behavior is normal. Judgment and thought content normal.          Assessment & Plan:   Problem List Items Addressed This Visit      Unprioritized   Acute bronchitis - Primary    Symptoms and exam consistent with early acute bronchitis. Will start Levaquin and Prednisone taper. Tussionex as needed for cough. Follow up if symptoms are not improving.        Return in about 2 weeks (around 11/23/2014) for Recheck.

## 2014-11-29 ENCOUNTER — Ambulatory Visit (INDEPENDENT_AMBULATORY_CARE_PROVIDER_SITE_OTHER): Payer: PPO | Admitting: Internal Medicine

## 2014-11-29 ENCOUNTER — Encounter: Payer: Self-pay | Admitting: Internal Medicine

## 2014-11-29 VITALS — BP 113/69 | HR 60 | Temp 98.0°F | Ht 65.5 in | Wt 228.8 lb

## 2014-11-29 DIAGNOSIS — R739 Hyperglycemia, unspecified: Secondary | ICD-10-CM

## 2014-11-29 DIAGNOSIS — E119 Type 2 diabetes mellitus without complications: Secondary | ICD-10-CM | POA: Insufficient documentation

## 2014-11-29 DIAGNOSIS — R7309 Other abnormal glucose: Secondary | ICD-10-CM | POA: Insufficient documentation

## 2014-11-29 DIAGNOSIS — I10 Essential (primary) hypertension: Secondary | ICD-10-CM

## 2014-11-29 DIAGNOSIS — J209 Acute bronchitis, unspecified: Secondary | ICD-10-CM

## 2014-11-29 NOTE — Assessment & Plan Note (Signed)
Symptoms resolved. Will continue to monitor.

## 2014-11-29 NOTE — Progress Notes (Signed)
   Subjective:    Patient ID: Erin Camacho, female    DOB: [birth year], 69 y.o.   MRN: [id number]  HPI 69YO female presents for follow up.  Last seen - 12/22 with acute bronchitis. Feeling well. No residual cough, dyspnea, wheezing.  Questions if she really has diagnosis of DM. Was diagnosed as a teen but has never been on meds.  Also questions whether she needs BP meds. Tolerating meds well. No lightheadedness, palpitations, chest pain.  Past medical, surgical, family and social history per today's encounter.  Review of Systems  Constitutional: Negative for fever, chills, appetite change, fatigue and unexpected weight change.  Eyes: Negative for visual disturbance.  Respiratory: Negative for cough, chest tightness, shortness of breath and wheezing.   Cardiovascular: Negative for chest pain, palpitations and leg swelling.  Gastrointestinal: Negative for abdominal pain.  Musculoskeletal: Negative for myalgias and arthralgias.  Skin: Negative for color change and rash.  Hematological: Negative for adenopathy. Does not bruise/bleed easily.  Psychiatric/Behavioral: Negative for dysphoric mood. The patient is not nervous/anxious.        Objective:    BP 113/69 mmHg  Pulse 60  Temp(Src) 98 F (36.7 C) (Oral)  Ht 5' 5.5" (1.664 m)  Wt 228 lb 12 oz (103.76 kg)  BMI 37.47 kg/m2  SpO2 97% Physical Exam  Constitutional: She is oriented to person, place, and time. She appears well-developed and well-nourished. No distress.  HENT:  Head: Normocephalic and atraumatic.  Right Ear: External ear normal.  Left Ear: External ear normal.  Nose: Nose normal.  Mouth/Throat: Oropharynx is clear and moist.  Eyes: Conjunctivae are normal. Pupils are equal, round, and reactive to light. Right eye exhibits no discharge. Left eye exhibits no discharge. No scleral icterus.  Neck: Normal range of motion. Neck supple. No tracheal deviation present. No thyromegaly present.  Cardiovascular: Normal  rate, regular rhythm, normal heart sounds and intact distal pulses.  Exam reveals no gallop and no friction rub.   No murmur heard. Pulmonary/Chest: Effort normal and breath sounds normal. No accessory muscle usage. No tachypnea. No respiratory distress. She has no decreased breath sounds. She has no wheezes. She has no rhonchi. She has no rales. She exhibits no tenderness.  Musculoskeletal: Normal range of motion. She exhibits no edema or tenderness.  Lymphadenopathy:    She has no cervical adenopathy.  Neurological: She is alert and oriented to person, place, and time. No cranial nerve deficit. She exhibits normal muscle tone. Coordination normal.  Skin: Skin is warm and dry. No rash noted. She is not diaphoretic. No erythema. No pallor.  Psychiatric: She has a normal mood and affect. Her behavior is normal. Judgment and thought content normal.          Assessment & Plan:   Problem List Items Addressed This Visit      Unprioritized   Acute bronchitis - Primary    Symptoms resolved. Will continue to monitor.    Elevated blood sugar    Elevated BG in past. Last A1c 6-6.2%. Discussed that her BG in range of at risk of DM, but not consistent with DM. Will continue to monitor. Encouraged healthy diet and exercise.    Hypertension    BP Readings from Last 3 Encounters:  11/29/14 113/69  11/09/14 133/79  08/27/14 118/64   BP well controlled. Continue current medications.        Return in about 3 months (around 02/28/2015) for Wellness Visit.

## 2014-11-29 NOTE — Patient Instructions (Signed)
Follow up in 3 months for Wellness Visit.

## 2014-11-29 NOTE — Assessment & Plan Note (Signed)
Elevated BG in past. Last A1c 6-6.2%. Discussed that her BG in range of at risk of DM, but not consistent with DM. Will continue to monitor. Encouraged healthy diet and exercise.

## 2014-11-29 NOTE — Progress Notes (Signed)
Pre visit review using our clinic review tool, if applicable. No additional management support is needed unless otherwise documented below in the visit note. 

## 2014-11-29 NOTE — Assessment & Plan Note (Signed)
BP Readings from Last 3 Encounters:  11/29/14 113/69  11/09/14 133/79  08/27/14 118/64   BP well controlled. Continue current medications.

## 2015-02-08 ENCOUNTER — Other Ambulatory Visit: Payer: Self-pay | Admitting: *Deleted

## 2015-02-08 ENCOUNTER — Encounter: Payer: Self-pay | Admitting: Internal Medicine

## 2015-02-08 MED ORDER — OMEPRAZOLE 20 MG PO CPDR: 20.0000 mg | DELAYED_RELEASE_CAPSULE | Freq: Two times a day (BID) | ORAL | Status: DC

## 2015-02-08 MED ORDER — SIMVASTATIN 10 MG PO TABS: 5.0000 mg | ORAL_TABLET | Freq: Every day | ORAL | Status: DC

## 2015-02-28 ENCOUNTER — Ambulatory Visit (INDEPENDENT_AMBULATORY_CARE_PROVIDER_SITE_OTHER): Payer: PPO | Admitting: Internal Medicine

## 2015-02-28 ENCOUNTER — Encounter: Payer: Self-pay | Admitting: Internal Medicine

## 2015-02-28 VITALS — BP 147/74 | HR 54 | Temp 98.2°F | Ht 65.0 in | Wt 234.0 lb

## 2015-02-28 DIAGNOSIS — Z0000 Encounter for general adult medical examination without abnormal findings: Secondary | ICD-10-CM | POA: Diagnosis not present

## 2015-02-28 LAB — CBC WITH DIFFERENTIAL/PLATELET
Basophils Absolute: 0 10*3/uL (ref 0.0–0.1)
Basophils Relative: 0.5 % (ref 0.0–3.0)
Eosinophils Absolute: 0.1 10*3/uL (ref 0.0–0.7)
Eosinophils Relative: 2.2 % (ref 0.0–5.0)
HCT: 40.3 % (ref 36.0–46.0)
Hemoglobin: 13.7 g/dL (ref 12.0–15.0)
Lymphocytes Relative: 25.6 % (ref 12.0–46.0)
Lymphs Abs: 1.6 10*3/uL (ref 0.7–4.0)
MCHC: 33.9 g/dL (ref 30.0–36.0)
MCV: 89.6 fl (ref 78.0–100.0)
Monocytes Absolute: 0.6 10*3/uL (ref 0.1–1.0)
Monocytes Relative: 8.8 % (ref 3.0–12.0)
Neutro Abs: 4.1 10*3/uL (ref 1.4–7.7)
Neutrophils Relative %: 62.9 % (ref 43.0–77.0)
Platelets: 208 10*3/uL (ref 150.0–400.0)
RBC: 4.5 Mil/uL (ref 3.87–5.11)
RDW: 14.7 % (ref 11.5–15.5)
WBC: 6.4 10*3/uL (ref 4.0–10.5)

## 2015-02-28 LAB — COMPREHENSIVE METABOLIC PANEL WITH GFR
ALT: 11 U/L (ref 0–35)
AST: 15 U/L (ref 0–37)
Albumin: 4.1 g/dL (ref 3.5–5.2)
Alkaline Phosphatase: 84 U/L (ref 39–117)
BUN: 30 mg/dL — ABNORMAL HIGH (ref 6–23)
CO2: 31 meq/L (ref 19–32)
Calcium: 10.2 mg/dL (ref 8.4–10.5)
Chloride: 103 meq/L (ref 96–112)
Creatinine, Ser: 0.89 mg/dL (ref 0.40–1.20)
GFR: 66.68 mL/min
Glucose, Bld: 114 mg/dL — ABNORMAL HIGH (ref 70–99)
Potassium: 4.2 meq/L (ref 3.5–5.1)
Sodium: 140 meq/L (ref 135–145)
Total Bilirubin: 0.6 mg/dL (ref 0.2–1.2)
Total Protein: 7 g/dL (ref 6.0–8.3)

## 2015-02-28 LAB — LIPID PANEL
Cholesterol: 175 mg/dL (ref 0–200)
HDL: 73.4 mg/dL
LDL Cholesterol: 87 mg/dL (ref 0–99)
NonHDL: 101.6
Total CHOL/HDL Ratio: 2
Triglycerides: 75 mg/dL (ref 0.0–149.0)
VLDL: 15 mg/dL (ref 0.0–40.0)

## 2015-02-28 LAB — TSH: TSH: 1.88 u[IU]/mL (ref 0.35–4.50)

## 2015-02-28 LAB — MICROALBUMIN / CREATININE URINE RATIO
Creatinine,U: 128.6 mg/dL
Microalb Creat Ratio: 0.5 mg/g (ref 0.0–30.0)
Microalb, Ur: 0.6 mg/dL (ref 0.0–1.9)

## 2015-02-28 LAB — HEMOGLOBIN A1C: Hgb A1c MFr Bld: 6.1 % (ref 4.6–6.5)

## 2015-02-28 LAB — VITAMIN D 25 HYDROXY (VIT D DEFICIENCY, FRACTURES): VITD: 37.08 ng/mL (ref 30.00–100.00)

## 2015-02-28 NOTE — Patient Instructions (Signed)

## 2015-02-28 NOTE — Progress Notes (Signed)
Pre visit review using our clinic review tool, if applicable. No additional management support is needed unless otherwise documented below in the visit note. 

## 2015-02-28 NOTE — Progress Notes (Signed)
Subjective:    Patient ID: Erin Camacho, female    DOB: Oct 07, 1946, 69 y.o.   MRN: [id number]  HPI  The patient is here for annual Medicare wellness examination and management of other chronic and acute problems.   The risk factors are reflected in the social history.  The roster of all physicians providing medical care to patient - is listed in the Snapshot section of the chart.  Activities of daily living:  The patient is 100% independent in all ADLs: dressing, toileting, feeding as well as independent mobility. Lives with husband and has 1 dog. Lives in [place], one level. Floors are carpeted with some hardwoods.  Home safety : The patient has smoke detectors and CO detector in the home. They wear seatbelts.  There are no firearms at home. There is no violence in the home.   There is no risks for hepatitis, STDs or HIV. There is no history of blood transfusion. They have no travel history to infectious disease endemic areas of the world.  The patient has seen their dentist in the last six month. (Dr. Juleen China) They have seen their eye doctor in the last year. (Dr. Chong Sicilian) Evaluated for hearing loss by Dr. Kathyrn Sheriff in 2012, normal. They have deferred audiologic testing in the last year.   They do not  have excessive sun exposure. Discussed the need for sun protection: hats, long sleeves and use of sunscreen if there is significant sun exposure. (Derm - Dr. Kellie Moor) Pain Management/Chronic Back Pain - Dr. Sharlet Salina  Diet: the importance of a healthy diet is discussed. They do have a relatively healthy diet. Notes increased sweets. Trying to limit.  The benefits of regular aerobic exercise were discussed. Not currently exercising.  Depression screen: there are no signs or vegative symptoms of depression- irritability, change in appetite, anhedonia, sadness/tearfullness.  Cognitive assessment: the patient manages all their financial and personal affairs and is actively engaged. They  could relate day,date,year and events.  HCPOA -  husband, Erin Camacho.  The following portions of the patient's history were reviewed and updated as appropriate: allergies, current medications, past family history, past medical history,  past surgical history, past social history  and problem list.  Visual acuity was not assessed per patient preference since she has regular follow up with her ophthalmologist. Hearing and body mass index were assessed and reviewed.   During the course of the visit the patient was educated and counseled about appropriate screening and preventive services including : fall prevention , diabetes screening, nutrition counseling, colorectal cancer screening, and recommended immunizations.    Wt Readings from Last 3 Encounters:  02/28/15 234 lb (106.142 kg)  11/29/14 228 lb 12 oz (103.76 kg)  11/09/14 232 lb 8 oz (105.461 kg)    Past medical, surgical, family and social history per today's encounter.  Review of Systems  Constitutional: Negative for fever, chills, appetite change, fatigue and unexpected weight change.  Eyes: Negative for visual disturbance.  Respiratory: Negative for shortness of breath.   Cardiovascular: Negative for chest pain and leg swelling.  Gastrointestinal: Negative for nausea, vomiting, abdominal pain, diarrhea and constipation.  Musculoskeletal: Positive for myalgias, back pain and arthralgias.  Skin: Negative for color change and rash.  Hematological: Negative for adenopathy. Does not bruise/bleed easily.  Psychiatric/Behavioral: Negative for suicidal ideas, sleep disturbance and dysphoric mood. The patient is not nervous/anxious.        Objective:    BP 147/74 mmHg  Pulse 54  Temp(Src) 98.2 F (36.8  C) (Oral)  Ht 5\' 5"  (1.651 m)  Wt 234 lb (106.142 kg)  BMI 38.94 kg/m2  SpO2 98% Physical Exam  Constitutional: She is oriented to person, place, and time. She appears well-developed and well-nourished. No distress.  HENT:    Head: Normocephalic and atraumatic.  Right Ear: External ear normal.  Left Ear: External ear normal.  Nose: Nose normal.  Mouth/Throat: Oropharynx is clear and moist. No oropharyngeal exudate.  Eyes: Conjunctivae are normal. Pupils are equal, round, and reactive to light. Right eye exhibits no discharge. Left eye exhibits no discharge. No scleral icterus.  Neck: Normal range of motion. Neck supple. No tracheal deviation present. No thyromegaly present.  Cardiovascular: Normal rate, regular rhythm, normal heart sounds and intact distal pulses.  Exam reveals no gallop and no friction rub.   No murmur heard. Pulmonary/Chest: Effort normal and breath sounds normal. No accessory muscle usage. No tachypnea. No respiratory distress. She has no decreased breath sounds. She has no wheezes. She has no rales. She exhibits no tenderness. Right breast exhibits no inverted nipple, no mass, no nipple discharge, no skin change and no tenderness. Left breast exhibits no inverted nipple, no mass, no nipple discharge, no skin change and no tenderness. Breasts are symmetrical.  Abdominal: Soft. Bowel sounds are normal. She exhibits no distension and no mass. There is no tenderness. There is no rebound and no guarding.  Musculoskeletal: Normal range of motion. She exhibits no edema or tenderness.  Lymphadenopathy:    She has no cervical adenopathy.  Neurological: She is alert and oriented to person, place, and time. No cranial nerve deficit. She exhibits normal muscle tone. Coordination normal.  Skin: Skin is warm and dry. No rash noted. She is not diaphoretic. No erythema. No pallor.  Psychiatric: She has a normal mood and affect. Her behavior is normal. Judgment and thought content normal.          Assessment & Plan:   Problem List Items Addressed This Visit      Unprioritized   Medicare annual wellness visit, subsequent - Primary    Camacho medical exam including breast exam normal today.  PAP and  pelvic deferred given pt age, s/p hysterectomy and pt preference. Appropriate screening performed.  Will check labs including CMP, CBC, lipids. Immunizations are UTD except for TDAP which she will get through health dept. Encouraged healthy diet and regular exercise.  Patient was given a handout regarding current recommendations for health maintenance and preventative care on the AVS.          Relevant Orders   TSH   Hemoglobin A1c   CBC with Differential/Platelet   Comprehensive metabolic panel   Lipid panel   Microalbumin / creatinine urine ratio   Vit D  25 hydroxy (rtn osteoporosis monitoring)       Return in about 6 months (around 08/30/2015) for Recheck.

## 2015-02-28 NOTE — Assessment & Plan Note (Signed)
General medical exam including breast exam normal today.  PAP and pelvic deferred given pt age, s/p hysterectomy and pt preference. Appropriate screening performed.  Will check labs including CMP, CBC, lipids. Immunizations are UTD except for TDAP which she will get through health dept. Encouraged healthy diet and regular exercise.  Patient was given a handout regarding current recommendations for health maintenance and preventative care on the AVS.

## 2015-03-13 NOTE — Op Note (Signed)
PATIENT NAME:  Erin Camacho, Erin Camacho MR#:  [id number] DATE OF BIRTH:  11/09/1946  DATE OF PROCEDURE:  03/12/2012  PREOPERATIVE DIAGNOSIS: Degenerative arthrosis of the right hip.   POSTOPERATIVE DIAGNOSIS: Degenerative arthrosis of the right hip.   PROCEDURE PERFORMED: Right total hip arthroplasty.   SURGEON: Laurice Record. Holley Bouche., MD   ASSISTANT: Vance Peper, PA-C (required to maintain retraction throughout the procedure)   ANESTHESIA: Spinal.   ESTIMATED BLOOD LOSS: 400 mL.   FLUIDS REPLACED: 2000 mL of Crystalloid.   DRAINS: Two medium drains to Hemovac reservoir.   IMPLANTS UTILIZED: DePuy 13.5 mm small stature AML femoral component, 52 mm outer diameter Pinnacle 100 acetabular component, +4 mm neutral Pinnacle Marathon polyethylene liner, and a 36 mm outer diameter M-Spec femoral head with a +1.5 mm neck length.   INDICATIONS FOR SURGERY: The patient is a 69 year old female who has been seen for complaints of progressive right hip and groin pain. X-rays demonstrated significant degenerative changes. She did not see any significant improvement despite conservative nonsurgical intervention. After discussion of the risks and benefits of surgical intervention, the patient expressed her understanding of the risks and benefits and agreed with plans for surgical intervention.   PROCEDURE IN DETAIL: The patient was brought in the operating room and, after adequate spinal anesthesia was achieved, the patient was placed in the left lateral decubitus position. Axillary roll was placed and all bony prominences were well padded. [name] right hip and leg were cleaned and prepped with alcohol and DuraPrep and draped in the usual sterile fashion. A "time-out" was performed as per usual protocol. A lateral curvilinear incision was made gently curving towards the posterior superior iliac spine. IT band was incised in line with the skin incision and fibers of the gluteus maximus were split in line. The gluteal  sling was incised so as to better mobilize the proximal femur. The piriformis tendon was identified, skeletonized, and incised at its insertion at the proximal femur and reflected posteriorly. In a similar fashion, short external rotators were incised and reflected posteriorly. A T-type posterior capsulotomy was performed. Prior to dislocation of the femoral head, a threaded Steinmann pin was inserted through a separate stab incision into the pelvis and then bent in the form of a stylus so as to assess limb length and hip offset throughout the procedure. Femoral head was then dislocated posteriorly. Inspection of the femoral head demonstrated severe degenerative changes with full thickness cartilage loss superiorly. Femoral neck cut was performed using an oscillating saw. Anterior capsule was elevated off the femoral neck. Attention was then directed to the acetabulum. Remnant of the labrum was excised. Inspection of the acetabulum demonstrated degenerative changes with wear pattern primarily superiorly. Acetabulum was reamed in a sequential fashion up to a 51 mm diameter. Good punctate bleeding bone was encountered. A 52 mm outer diameter Pinnacle 100 acetabular component was positioned and impacted into place. Excellent scratch fit was appreciated. A +4 neutral polyethylene trial was inserted and attention was redirected to the proximal femur. A pilot hole for reaming of the proximal femoral canal was created using a high-speed bur. Proximal femoral canal was reamed in a sequential fashion up to a 13 mm diameter. This allowed for approximately 5 to 5.5 cm of scratch fit. Proximal femur was then prepared using a 13.5 mm aggressive side-biting reamer. Serial broaches were inserted up to a 13.5 mm small stature broach. Trial reduction was performed with a 36 mm hip ball with a +1.5 mm neck length.  Good equalization of limb lengths was appreciated and good hip offset was appreciated. Excellent stability was  appreciated both anteriorly and posteriorly. Trial hip ball was removed as was the broach. Polyethylene trial was removed and the shell was irrigated and suctioned dry. A 36 mm inner diameter +4 neutral Pinnacle Marathon polyethylene liner was positioned and impacted into place. Next, a 13.5 mm small stature AML femoral component was positioned and impacted into place. Excellent scratch fit was appreciated. Trial reduction was again performed with a 36 mm hip ball with a +1.5 mm neck length. Again, good equalization of limb lengths was appreciated and excellent stability both anteriorly and posteriorly. Trial hip ball was removed. The Morse taper was cleaned and dried. A 36 mm outer diameter M-Spec femoral head with a +1.5 mm neck length was placed on the trunnion and impacted into place. Hip was reduced and placed through a range of motion. Again, good equalization of limb lengths and excellent hip offset was noted as well as excellent stability both anteriorly and posteriorly.   Wound was irrigated with copious amounts of normal saline with antibiotic solution using pulsatile lavage and then suctioned dry. Good hemostasis was appreciated. Posterior capsulotomy was repaired using #5 Ethibond. Piriformis tendon was reapproximated on the undersurface of the gluteus medius tendon using #5 Ethibond. Gluteal sling was also repaired using interrupted sutures of #5 Ethibond. Two medium drains were placed in the wound bed and brought out through a separate stab incision to be attached to Hemovac reservoir. IT band was repaired using interrupted sutures of #1 Vicryl. Subcutaneous tissue was approximated in layers using first #0 Vicryl followed by #2-0 Vicryl. Skin was closed with skin staples. Sterile dressing was applied.   The patient tolerated the procedure well. She was transported to the recovery room in stable condition.   ____________________________ Laurice Record. Holley Bouche., MD jph:[id number] D: 03/12/2012 10:47:50  ET T: 03/12/2012 11:01:56 ET JOB#: [id number]  cc: Jeneen Rinks P. Holley Bouche., MD, <Dictator> JAMES P Holley Bouche MD ELECTRONICALLY SIGNED 03/13/2012 6:22

## 2015-03-13 NOTE — Discharge Summary (Signed)
PATIENT NAME:  Erin Camacho, Erin Camacho MR#:  [id number] DATE OF BIRTH:  08-Jul-1946  DATE OF ADMISSION:  03/12/2012 DATE OF DISCHARGE:  03/15/2012  ADMITTING DIAGNOSIS: Degenerative arthrosis of the right hip.   DISCHARGE DIAGNOSIS: Degenerative arthrosis of the right hip.  HISTORY: The patient is a 69 year old who has been followed at [facility] for progression of right hip and groin pain for quite some time. The pain had significantly increased in recent months. She did not have any history of any radiation of pain down the leg. She also had denied any radicular symptoms. Her pain was noted to be aggravated with weight-bearing activities. She had been having problems getting in and out of a car as well as getting up and down from a sitting position. Her pain had progressed to the point that it was significantly interfering with her activities of daily living. X-rays taken in the clinic showed narrowing of the intraarticular joint space. She was noted to have subchondral sclerosis. After discussion of the risks and benefits of surgical intervention, the patient expressed her understanding of the risks and benefits and agreed with plans for surgical intervention.   PROCEDURE: Right total hip arthroplasty.   ANESTHESIA: Spinal.   IMPLANTS UTILIZED: DePuy 13.5 mm small stature AML femoral component, 52 mm outer diameter Pinnacle 100 acetabular component, +4 mm neutral Pinnacle Marathon polyethylene liner, and a 36 mm outer diameter M-Spec femoral head with a +1.5 mm neck length.   HOSPITAL COURSE: The patient tolerated the procedure very well. She had no complications. She was then taken to the PAC-U where she was stabilized and then transferred to the orthopedic floor. She began receiving anticoagulation therapy of Lovenox 30 mg subcutaneous every 12 hours per anesthesia protocol. She was fitted with TED stockings bilaterally. The patient was also fitted with the AV-I compression foot pumps bilaterally set  at 80 mmHg. Her calves have been nontender. There has been no evidence of any deep venous thromboses. Negative Homans sign. Heels were elevated off the bed using rolled towels.   The patient has denied any chest pain or shortness of breath. Vital signs have been stable. She has been afebrile. Hemodynamically she was stable and no transfusions were given. The patient does admit to not using her incentive spirometer as much as she should. The patient has been hemodynamically stable.   The patient began receiving physical therapy on day one for gait training and transfers. This has been extremely slow. Upon being discharged, she was only ambulating 30 feet even though the patient states that she was not having any pain. Some of this may be secondary to deconditioning. Occupational therapy was also initiated on day one for activities of daily living and assistive devices.   [name] IV was discontinued on day one secondary to infiltration. Her Hemovac and Foley were discontinued on day two. The dressing was also changed on day two along with removal of the Hemovac. The wound was free of any drainage or any signs of infection.   DISPOSITION: The patient is being discharged to a skilled nursing facility in improved stable condition.   DISCHARGE INSTRUCTIONS:  1. She will continue with physical therapy for gait training and transfers. Occupational therapy for activities of daily living and assistive devices. She may weight bear as tolerated.  2. Abduction pillow between legs when in bed.  3. Elevate heels off the bed.  4. She may weight bear as tolerated.  5. TED stockings are to be worn around-the-clock. These may  be removed one hour per eight hour shift.  6. Incentive spirometer every 1 hour while awake.  7. Encourage cough and deep breathing every two hours while awake.  8. She is placed on a regular diet.  9. She has a follow-up appointment at [facility] on 04/22/2012 at 1:45 with Dr. Skip Estimable. She is to call the clinic sooner if any temperatures of 101.5 or excessive bleeding.   DRUG ALLERGIES: No known drug allergies, but is allergic to Band-Aids.  MEDICATIONS:  1. Tylenol ES 500 to 1000 mg every four hours p.r.n. for pain. 2. Ultram 100 mg every four hours p.r.n. for pain.  3. Senokot-S 1 tablet twice a day.  4. Milk of Magnesia 30 mL twice a day. 5. Dulcolax suppositories 10 mg rectally daily p.r.n.  6. Mylanta DS 30 mL every 6 hours p.r.n. 7. Allegra 180 mg daily.  8. Alprazolam 0.25 mg every eight hours p.r.n. for anxiety.  9. Flonase nasal spray two sprays to both nostrils daily. 10. Zocor 5 mg daily.  11. Multivitamin 1 capsule daily. 12. Desyrel 50 mg at bedtime.  13. Cozaar 50 mg daily. 14. Toprol-XL 25 mg daily.  15. Prilosec 40 mg every 6 a.m.  16. Dyazide one capsule daily.  17. Vitamin D3 2000 units daily.  18. Roxicodone 5 to 10 mg every 3 hours p.r.n. for pain.  19. Lovenox 30 mg subcutaneous every 12 hours for 14 days then discontinue and begin taking one 81 mg enteric-coated aspirin per day.   PAST MEDICAL HISTORY:  1. Hypertension.  2. Hyperlipidemia.  3. Borderline diabetes type 2.  4. Anxiety/depression.  5. Gastroesophageal reflux disease. 6. Hyperplastic colon polyps. 7. Recurrent bronchitis.  8. 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 9. Restless leg syndrome.  10. Sleep apnea.  ____________________________ Vance Peper, PA [id number] D: 03/15/2012 08:15:38 ET     T: 03/15/2012 08:44:22 ET        JOB#: [id number] cc: Vance Peper, PA, <Dictator> JON WOLFE PA ELECTRONICALLY SIGNED 03/15/2012 22:00

## 2015-03-30 ENCOUNTER — Encounter: Payer: Self-pay | Admitting: Internal Medicine

## 2015-04-03 ENCOUNTER — Encounter: Payer: Self-pay | Admitting: Internal Medicine

## 2015-04-04 ENCOUNTER — Other Ambulatory Visit: Payer: Self-pay | Admitting: *Deleted

## 2015-04-04 DIAGNOSIS — G4700 Insomnia, unspecified: Secondary | ICD-10-CM

## 2015-04-04 MED ORDER — TRAZODONE HCL 100 MG PO TABS: 100.0000 mg | ORAL_TABLET | Freq: Every day | ORAL | Status: DC

## 2015-04-30 ENCOUNTER — Encounter: Payer: Self-pay | Admitting: Internal Medicine

## 2015-04-30 DIAGNOSIS — F32A Depression, unspecified: Secondary | ICD-10-CM

## 2015-04-30 DIAGNOSIS — F329 Major depressive disorder, single episode, unspecified: Secondary | ICD-10-CM

## 2015-05-02 MED ORDER — LOSARTAN POTASSIUM-HCTZ 50-12.5 MG PO TABS: 1.0000 | ORAL_TABLET | Freq: Every day | ORAL | Status: DC

## 2015-05-02 MED ORDER — METOPROLOL SUCCINATE ER 25 MG PO TB24: 25.0000 mg | ORAL_TABLET | Freq: Every day | ORAL | Status: DC

## 2015-05-02 MED ORDER — SERTRALINE HCL 100 MG PO TABS: 50.0000 mg | ORAL_TABLET | Freq: Every day | ORAL | Status: DC

## 2015-08-25 ENCOUNTER — Ambulatory Visit (INDEPENDENT_AMBULATORY_CARE_PROVIDER_SITE_OTHER): Payer: PPO | Admitting: Family Medicine

## 2015-08-25 ENCOUNTER — Encounter: Payer: Self-pay | Admitting: Family Medicine

## 2015-08-25 VITALS — BP 114/72 | HR 64 | Temp 98.3°F | Ht 65.0 in | Wt 233.5 lb

## 2015-08-25 DIAGNOSIS — Z23 Encounter for immunization: Secondary | ICD-10-CM

## 2015-08-25 DIAGNOSIS — R102 Pelvic and perineal pain: Secondary | ICD-10-CM | POA: Insufficient documentation

## 2015-08-25 DIAGNOSIS — R1024 Suprapubic pain: Secondary | ICD-10-CM | POA: Insufficient documentation

## 2015-08-25 DIAGNOSIS — R1030 Lower abdominal pain, unspecified: Secondary | ICD-10-CM

## 2015-08-25 LAB — POCT URINALYSIS DIPSTICK
Bilirubin, UA: NEGATIVE
Glucose, UA: NEGATIVE
Ketones, UA: NEGATIVE
Nitrite, UA: NEGATIVE
Protein, UA: NEGATIVE
Spec Grav, UA: 1.02
Urobilinogen, UA: 4
pH, UA: 7

## 2015-08-25 MED ORDER — CEPHALEXIN 250 MG PO CAPS: 250.0000 mg | ORAL_CAPSULE | Freq: Four times a day (QID) | ORAL | Status: DC

## 2015-08-25 NOTE — Progress Notes (Signed)
Pre visit review using our clinic review tool, if applicable. No additional management support is needed unless otherwise documented below in the visit note. 

## 2015-08-25 NOTE — Patient Instructions (Signed)
Take the antibiotic as prescribed while awaiting the culture.  Follow up with Dr. Gilford Rile  Take care  Dr. Lacinda Axon

## 2015-08-25 NOTE — Assessment & Plan Note (Signed)
History and physical exam not fully consistent with UTI. Urinalysis revealed moderate leukocytes and trace blood. Will obtain culture. Patient preferred treatment with antibiotics while awaiting culture. Treating with Keflex.

## 2015-08-25 NOTE — Progress Notes (Signed)
Subjective:  Patient ID: Erin Camacho, female    DOB: 09/14/1946  Age: 69 y.o. MRN: [id number]  CC: Back pain, lower abdominal pressure.  HPI:  69 year old female presents to clinic today for an acute visit with the above complaints.   Patient reports that she has had a three-day history of left low back pain and suprapubic pressure. Mild in severity.  She denies any dysuria, urinary urgency, frequency. She denies any fevers chills. No exacerbating or relieving factors. No other associated symptoms.  Social Hx   Social History   Social History  . Marital Status: Married    Spouse Name: N/A  . Number of Children: N/A  . Years of Education: N/A   Social History Main Topics  . Smoking status: Never Smoker   . Smokeless tobacco: Never Used  . Alcohol Use: Yes     Comment: occ  . Drug Use: No  . Sexual Activity: Not Asked   Other Topics Concern  . None   Social History Narrative   Lives in [place] with husband.      Work - retired Sports administrator   Review of Systems  Constitutional: Negative for fever and chills.  Genitourinary: Negative for dysuria, urgency and frequency.   Objective:  BP 114/72 mmHg  Pulse 64  Temp(Src) 98.3 F (36.8 C) (Oral)  Ht 5\' 5"  (1.651 m)  Wt 233 lb 8 oz (105.915 kg)  BMI 38.86 kg/m2  SpO2 97%  BP/Weight 08/25/2015 02/28/2015 0/53/9767  Systolic BP 341 937 902  Diastolic BP 72 74 69  Wt. (Lbs) 233.5 234 228.75  BMI 38.86 38.94 37.47   Physical Exam  Constitutional: She appears well-developed and well-nourished. No distress.  Cardiovascular: Normal rate and regular rhythm.   Murmur heard. Pulmonary/Chest: Effort normal and breath sounds normal. No respiratory distress. She has no wheezes. She has no rales.  Abdominal: Soft.  Mild suprapubic tenderness. No CVA tenderness.  Neurological: She is alert.  Psychiatric: She has a normal mood and affect.  Vitals reviewed.  Results for orders placed or performed in visit on 08/25/15  (from the past 24 hour(s))  POCT Urinalysis Dipstick     Status: Abnormal   Collection Time: 08/25/15  3:00 PM  Result Value Ref Range   Color, UA yellow    Clarity, UA clear    Glucose, UA neg    Bilirubin, UA neg    Ketones, UA neg    Spec Grav, UA 1.020    Blood, UA trace    pH, UA 7.0    Protein, UA neg    Urobilinogen, UA 4.0    Nitrite, UA neg    Leukocytes, UA moderate (2+) (A) Negative    Assessment & Plan:   Problem List Items Addressed This Visit    Suprapubic pressure    History and physical exam not fully consistent with UTI. Urinalysis revealed moderate leukocytes and trace blood. Will obtain culture. Patient preferred treatment with antibiotics while awaiting culture. Treating with Keflex.      Relevant Medications   cephALEXin (KEFLEX) 250 MG capsule   Other Relevant Orders   POCT Urinalysis Dipstick (Completed)   Urine Culture      Meds ordered this encounter  Medications  . gabapentin (NEURONTIN) 300 MG capsule    Sig: 1 po qHS x 7 days, then bid  . cephALEXin (KEFLEX) 250 MG capsule    Sig: Take 1 capsule (250 mg total) by mouth 4 (four) times daily.  Dispense:  28 capsule    Refill:  0    Follow-up: PRN  Thersa Salt, DO

## 2015-08-27 LAB — URINE CULTURE: Colony Count: >=100000

## 2015-08-30 ENCOUNTER — Encounter: Payer: Self-pay | Admitting: Internal Medicine

## 2015-08-30 ENCOUNTER — Ambulatory Visit (INDEPENDENT_AMBULATORY_CARE_PROVIDER_SITE_OTHER): Payer: PPO | Admitting: Internal Medicine

## 2015-08-30 VITALS — BP 134/77 | HR 56 | Temp 98.1°F | Ht 65.0 in | Wt 235.5 lb

## 2015-08-30 DIAGNOSIS — R7309 Other abnormal glucose: Secondary | ICD-10-CM | POA: Diagnosis not present

## 2015-08-30 DIAGNOSIS — I10 Essential (primary) hypertension: Secondary | ICD-10-CM

## 2015-08-30 DIAGNOSIS — F3341 Major depressive disorder, recurrent, in partial remission: Secondary | ICD-10-CM

## 2015-08-30 DIAGNOSIS — R739 Hyperglycemia, unspecified: Secondary | ICD-10-CM

## 2015-08-30 DIAGNOSIS — M5116 Intervertebral disc disorders with radiculopathy, lumbar region: Secondary | ICD-10-CM | POA: Diagnosis not present

## 2015-08-30 DIAGNOSIS — Z1239 Encounter for other screening for malignant neoplasm of breast: Secondary | ICD-10-CM

## 2015-08-30 LAB — COMPREHENSIVE METABOLIC PANEL WITH GFR
ALT: 16 U/L (ref 0–35)
AST: 22 U/L (ref 0–37)
Albumin: 4.1 g/dL (ref 3.5–5.2)
Alkaline Phosphatase: 74 U/L (ref 39–117)
BUN: 15 mg/dL (ref 6–23)
CO2: 30 meq/L (ref 19–32)
Calcium: 10.3 mg/dL (ref 8.4–10.5)
Chloride: 104 meq/L (ref 96–112)
Creatinine, Ser: 0.84 mg/dL (ref 0.40–1.20)
GFR: 71.18 mL/min
Glucose, Bld: 127 mg/dL — ABNORMAL HIGH (ref 70–99)
Potassium: 4.1 meq/L (ref 3.5–5.1)
Sodium: 143 meq/L (ref 135–145)
Total Bilirubin: 0.5 mg/dL (ref 0.2–1.2)
Total Protein: 6.9 g/dL (ref 6.0–8.3)

## 2015-08-30 LAB — HEMOGLOBIN A1C: Hgb A1c MFr Bld: 6.1 % (ref 4.6–6.5)

## 2015-08-30 NOTE — Progress Notes (Signed)
Subjective:    Patient ID: Erin Camacho, female    DOB: [birth year], 69 y.o.   MRN: [id number]  HPI  69YO female presents for follow up.  Back pain - Severe recent lower back pain. Sharp radiates down left leg. Followed by Dr. Sharlet Salina. Had [facility] 9/20. Some improvement with this. Follow up scheduled. [employer], however water exercise caused worsening pain in left leg. Pain is also worsened with walking. Taking Tramadol with some improvement. Also taking Gabapentin. Pain is severe in the mornings. Feels like she may fall. Using arms to stabilize gait. Had one fall in her yard in the summer. Fell forward. No injury noted.  Aside from this, feeling well. No symptoms of depression. Compliant with medication. No other concerns today.  Wt Readings from Last 3 Encounters:  08/30/15 235 lb 8 oz (106.822 kg)  08/25/15 233 lb 8 oz (105.915 kg)  02/28/15 234 lb (106.142 kg)   BP Readings from Last 3 Encounters:  08/30/15 134/77  08/25/15 114/72  02/28/15 147/74    Past Medical History  Diagnosis Date  . Arthritis   . GERD (gastroesophageal reflux disease)   . Allergy     hay fever  . Hypertension   . Hyperlipidemia   . UTI (lower urinary tract infection)   . Spinal stenosis   . Chronic back pain   . Pneumonia   . Diabetes mellitus     no meds      pt states she is not diabetic   Family History  Problem Relation Age of Onset  . Hypertension Mother   . Stroke Mother   . Heart disease Mother   . Hypertension Father   . Stroke Father   . Heart disease Father   . Cancer Other     breast  . Diabetes Other   . Arthritis Sister   . Diabetes Sister   . Dementia Sister   . Diabetes Brother   . Diabetes Brother    Past Surgical History  Procedure Laterality Date  . Cholecystectomy  1995  . Appendectomy    . Abdominal hysterectomy  1973  . Tonsillectomy and adenoidectomy  1979  . Lap band  08/2003  . Hernia repair  08/2006  . Total hip arthroplasty  02/2012   Right, Dr. Marry Guan  . Septoplasty  04/1978  . Back surgery  04/17/13   Social History   Social History  . Marital Status: Married    Spouse Name: N/A  . Number of Children: N/A  . Years of Education: N/A   Social History Main Topics  . Smoking status: Never Smoker   . Smokeless tobacco: Never Used  . Alcohol Use: Yes     Comment: occ  . Drug Use: No  . Sexual Activity: Not Asked   Other Topics Concern  . None   Social History Narrative   Lives in [place] with husband.      Work - retired Sports administrator    Review of Systems  Constitutional: Negative for fever, chills, appetite change, fatigue and unexpected weight change.  Eyes: Negative for visual disturbance.  Respiratory: Negative for shortness of breath.   Cardiovascular: Negative for chest pain and leg swelling.  Gastrointestinal: Negative for nausea, vomiting, abdominal pain, diarrhea and constipation.  Genitourinary: Negative for dysuria, urgency, hematuria and flank pain.  Musculoskeletal: Positive for myalgias, back pain, arthralgias and gait problem.  Skin: Negative for color change and rash.  Neurological: Positive for weakness. Negative for dizziness,  numbness and headaches.  Hematological: Negative for adenopathy. Does not bruise/bleed easily.  Psychiatric/Behavioral: Positive for sleep disturbance. Negative for suicidal ideas and dysphoric mood. The patient is not nervous/anxious.        Objective:    BP 134/77 mmHg  Pulse 56  Temp(Src) 98.1 F (36.7 C) (Oral)  Ht 5\' 5"  (1.651 m)  Wt 235 lb 8 oz (106.822 kg)  BMI 39.19 kg/m2  SpO2 96% Physical Exam  Constitutional: She is oriented to person, place, and time. She appears well-developed and well-nourished. No distress.  HENT:  Head: Normocephalic and atraumatic.  Right Ear: External ear normal.  Left Ear: External ear normal.  Nose: Nose normal.  Mouth/Throat: Oropharynx is clear and moist. No oropharyngeal exudate.  Eyes: Conjunctivae are  normal. Pupils are equal, round, and reactive to light. Right eye exhibits no discharge. Left eye exhibits no discharge. No scleral icterus.  Neck: Normal range of motion. Neck supple. No tracheal deviation present. No thyromegaly present.  Cardiovascular: Normal rate, regular rhythm, normal heart sounds and intact distal pulses.  Exam reveals no gallop and no friction rub.   No murmur heard. Pulmonary/Chest: Effort normal and breath sounds normal. No respiratory distress. She has no wheezes. She has no rales. She exhibits no tenderness.  Musculoskeletal: Normal range of motion. She exhibits no edema or tenderness.       Lumbar back: She exhibits pain. She exhibits normal range of motion, no tenderness and no deformity.  Lymphadenopathy:    She has no cervical adenopathy.  Neurological: She is alert and oriented to person, place, and time. No cranial nerve deficit. She exhibits normal muscle tone. Coordination normal.  Skin: Skin is warm and dry. No rash noted. She is not diaphoretic. No erythema. No pallor.  Psychiatric: She has a normal mood and affect. Her behavior is normal. Judgment and thought content normal.          Assessment & Plan:   Problem List Items Addressed This Visit      Unprioritized   Elevated blood sugar    Repeat A1c with labs today.      Relevant Orders   Hemoglobin A1c   Hypertension    BP Readings from Last 3 Encounters:  08/30/15 134/77  08/25/15 114/72  02/28/15 147/74   BP well controlled. Renal function with labs. Continue current medications.      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 Will continue.      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 Sharlet Salina. Significant pain despite recent ESI. Will set up follow up with neurosurgery. Question if repeat MRI might be helpful for evaluation. Continue  Gabapentin and prn Tramadol.      Relevant Orders   Ambulatory referral to Neurosurgery    Other Visit Diagnoses    Screening for breast cancer        Relevant Orders    MM Digital Screening        Return in about 3 months (around 11/30/2015) for Recheck.

## 2015-08-30 NOTE — Assessment & Plan Note (Signed)
Reviewed notes from Dr. Sharlet Salina. Significant pain despite recent ESI. Will set up follow up with neurosurgery. Question if repeat MRI might be helpful for evaluation. Continue Gabapentin and prn Tramadol.

## 2015-08-30 NOTE — Progress Notes (Signed)
Pre visit review using our clinic review tool, if applicable. No additional management support is needed unless otherwise documented below in the visit note. 

## 2015-08-30 NOTE — Assessment & Plan Note (Signed)
BP Readings from Last 3 Encounters:  08/30/15 134/77  08/25/15 114/72  02/28/15 147/74   BP well controlled. Renal function with labs. Continue current medications.

## 2015-08-30 NOTE — Patient Instructions (Signed)
Labs today.  We will set up evaluation with Dr. Ellene Route.

## 2015-08-30 NOTE — Assessment & Plan Note (Signed)
Repeat A1c with labs today.

## 2015-08-30 NOTE — Assessment & Plan Note (Signed)
Symptoms well controlled with Sertraline. Will continue. 

## 2015-09-21 ENCOUNTER — Ambulatory Visit: Payer: PPO

## 2015-09-23 ENCOUNTER — Ambulatory Visit
Admission: RE | Admit: 2015-09-23 | Discharge: 2015-09-23 | Disposition: A | Discharge status: Home / Self Care | Payer: PPO | Source: Ambulatory Visit | Attending: Internal Medicine | Admitting: Internal Medicine

## 2015-09-23 DIAGNOSIS — Z1231 Encounter for screening mammogram for malignant neoplasm of breast: Secondary | ICD-10-CM | POA: Insufficient documentation

## 2015-09-23 DIAGNOSIS — Z1239 Encounter for other screening for malignant neoplasm of breast: Secondary | ICD-10-CM

## 2015-09-24 ENCOUNTER — Other Ambulatory Visit: Payer: Self-pay | Admitting: Neurological Surgery

## 2015-09-24 DIAGNOSIS — M5416 Radiculopathy, lumbar region: Secondary | ICD-10-CM

## 2015-09-30 LAB — HM MAMMOGRAPHY

## 2015-10-11 ENCOUNTER — Ambulatory Visit
Admission: RE | Admit: 2015-10-11 | Discharge: 2015-10-11 | Disposition: A | Discharge status: Home / Self Care | Payer: PPO | Source: Ambulatory Visit | Attending: Neurological Surgery | Admitting: Neurological Surgery

## 2015-10-11 DIAGNOSIS — M5416 Radiculopathy, lumbar region: Secondary | ICD-10-CM

## 2015-10-11 MED ORDER — GADOBENATE DIMEGLUMINE 529 MG/ML IV SOLN
20.0000 mL | Freq: Once | INTRAVENOUS | Status: AC | PRN
  Administered 2015-10-11: 20 mL via INTRAVENOUS

## 2015-10-25 ENCOUNTER — Other Ambulatory Visit: Payer: Self-pay | Admitting: Neurological Surgery

## 2015-11-07 ENCOUNTER — Other Ambulatory Visit: Payer: Self-pay

## 2015-11-07 ENCOUNTER — Encounter: Payer: Self-pay | Admitting: Internal Medicine

## 2015-11-07 DIAGNOSIS — J309 Allergic rhinitis, unspecified: Secondary | ICD-10-CM

## 2015-11-07 MED ORDER — FLUTICASONE PROPIONATE 50 MCG/ACT NA SUSP: 2.0000 | Freq: Every day | NASAL | Status: DC

## 2015-11-22 ENCOUNTER — Encounter: Payer: Self-pay | Admitting: Internal Medicine

## 2015-11-22 ENCOUNTER — Ambulatory Visit (INDEPENDENT_AMBULATORY_CARE_PROVIDER_SITE_OTHER): Payer: PPO | Admitting: Internal Medicine

## 2015-11-22 VITALS — BP 95/67 | HR 58 | Temp 98.5°F | Ht 65.0 in | Wt 232.2 lb

## 2015-11-22 DIAGNOSIS — J309 Allergic rhinitis, unspecified: Secondary | ICD-10-CM | POA: Insufficient documentation

## 2015-11-22 DIAGNOSIS — Z01818 Encounter for other preprocedural examination: Secondary | ICD-10-CM

## 2015-11-22 DIAGNOSIS — R739 Hyperglycemia, unspecified: Secondary | ICD-10-CM

## 2015-11-22 DIAGNOSIS — M5116 Intervertebral disc disorders with radiculopathy, lumbar region: Secondary | ICD-10-CM | POA: Diagnosis not present

## 2015-11-22 DIAGNOSIS — R7309 Other abnormal glucose: Secondary | ICD-10-CM

## 2015-11-22 DIAGNOSIS — I10 Essential (primary) hypertension: Secondary | ICD-10-CM

## 2015-11-22 LAB — URINALYSIS, ROUTINE W REFLEX MICROSCOPIC
Bilirubin Urine: NEGATIVE
Hgb urine dipstick: NEGATIVE
Ketones, ur: NEGATIVE
Nitrite: NEGATIVE
Specific Gravity, Urine: >=1.03 — AB
Total Protein, Urine: NEGATIVE
Urine Glucose: NEGATIVE
Urobilinogen, UA: 1
pH: 5.5 (ref 5.0–8.0)

## 2015-11-22 LAB — COMPREHENSIVE METABOLIC PANEL WITH GFR
ALT: 16 U/L (ref 0–35)
AST: 21 U/L (ref 0–37)
Albumin: 4.2 g/dL (ref 3.5–5.2)
Alkaline Phosphatase: 81 U/L (ref 39–117)
BUN: 24 mg/dL — ABNORMAL HIGH (ref 6–23)
CO2: 30 meq/L (ref 19–32)
Calcium: 10.1 mg/dL (ref 8.4–10.5)
Chloride: 103 meq/L (ref 96–112)
Creatinine, Ser: 0.9 mg/dL (ref 0.40–1.20)
GFR: 65.69 mL/min
Glucose, Bld: 142 mg/dL — ABNORMAL HIGH (ref 70–99)
Potassium: 4 meq/L (ref 3.5–5.1)
Sodium: 140 meq/L (ref 135–145)
Total Bilirubin: 0.5 mg/dL (ref 0.2–1.2)
Total Protein: 6.9 g/dL (ref 6.0–8.3)

## 2015-11-22 LAB — CBC WITH DIFFERENTIAL/PLATELET
Basophils Absolute: 0 10*3/uL (ref 0.0–0.1)
Basophils Relative: 0.5 % (ref 0.0–3.0)
Eosinophils Absolute: 0.2 10*3/uL (ref 0.0–0.7)
Eosinophils Relative: 5 % (ref 0.0–5.0)
HCT: 41.8 % (ref 36.0–46.0)
Hemoglobin: 13.5 g/dL (ref 12.0–15.0)
Lymphocytes Relative: 28.1 % (ref 12.0–46.0)
Lymphs Abs: 1.4 10*3/uL (ref 0.7–4.0)
MCHC: 32.3 g/dL (ref 30.0–36.0)
MCV: 91 fl (ref 78.0–100.0)
Monocytes Absolute: 0.5 10*3/uL (ref 0.1–1.0)
Monocytes Relative: 9.2 % (ref 3.0–12.0)
Neutro Abs: 2.8 10*3/uL (ref 1.4–7.7)
Neutrophils Relative %: 57.2 % (ref 43.0–77.0)
Platelets: 187 10*3/uL (ref 150.0–400.0)
RBC: 4.59 Mil/uL (ref 3.87–5.11)
RDW: 14.2 % (ref 11.5–15.5)
WBC: 4.9 10*3/uL (ref 4.0–10.5)

## 2015-11-22 LAB — HEMOGLOBIN A1C: Hgb A1c MFr Bld: 6.2 % (ref 4.6–6.5)

## 2015-11-22 LAB — LIPID PANEL
Cholesterol: 162 mg/dL (ref 0–200)
HDL: 58.7 mg/dL
LDL Cholesterol: 84 mg/dL (ref 0–99)
NonHDL: 103.51
Total CHOL/HDL Ratio: 3
Triglycerides: 99 mg/dL (ref 0.0–149.0)
VLDL: 19.8 mg/dL (ref 0.0–40.0)

## 2015-11-22 LAB — PROTIME-INR
INR: 1 ratio (ref 0.8–1.0)
Prothrombin Time: 10.8 s (ref 9.6–13.1)

## 2015-11-22 MED ORDER — OMEPRAZOLE 40 MG PO CPDR: 40.0000 mg | DELAYED_RELEASE_CAPSULE | Freq: Every day | ORAL | Status: DC

## 2015-11-22 MED ORDER — FLUTICASONE PROPIONATE 50 MCG/ACT NA SUSP: 2.0000 | Freq: Every day | NASAL | Status: DC

## 2015-11-22 MED ORDER — TRAMADOL HCL 50 MG PO TABS: 50.0000 mg | ORAL_TABLET | Freq: Every day | ORAL | Status: DC

## 2015-11-22 NOTE — Assessment & Plan Note (Signed)
Will recheck A1c with labs. We discussed that she may need insulin coverage while inpatient for upcoming surgery, as she has needed this in the past.

## 2015-11-22 NOTE — Progress Notes (Signed)
Subjective:    Patient ID: Erin Camacho, female    DOB: 30-Jun-1946, 70 y.o.   MRN: [id number]  HPI  69YO female presents for follow up.  Scheduled for lumbar fusion L5-S1 this month. Taking Tramadol for pain with some improvement. Taking 3 of the 50mg  tabs daily. Also taking Gabapentin and Aleve.  No previous issues with anesthesia. No recent illnesses. Has had to have urinary catheterization for decrease UOP after surgery.  BG have been diet controlled, however she has required insulin after previous surgery.  Wt Readings from Last 3 Encounters:  11/22/15 232 lb 4 oz (105.348 kg)  08/30/15 235 lb 8 oz (106.822 kg)  08/25/15 233 lb 8 oz (105.915 kg)   BP Readings from Last 3 Encounters:  11/22/15 95/67  08/30/15 134/77  08/25/15 114/72    Past Medical History  Diagnosis Date  . Arthritis   . GERD (gastroesophageal reflux disease)   . Allergy     hay fever  . Hypertension   . Hyperlipidemia   . UTI (lower urinary tract infection)   . Spinal stenosis   . Chronic back pain   . Pneumonia   . Diabetes mellitus     no meds      pt states she is not diabetic   Family History  Problem Relation Age of Onset  . Hypertension Mother   . Stroke Mother   . Heart disease Mother   . Hypertension Father   . Stroke Father   . Heart disease Father   . Cancer Other     breast  . Diabetes Other   . Arthritis Sister   . Diabetes Sister   . Dementia Sister   . Breast cancer Sister   . Diabetes Brother   . Diabetes Brother   . Breast cancer Other 42   Past Surgical History  Procedure Laterality Date  . Cholecystectomy  1995  . Appendectomy    . Abdominal hysterectomy  1973  . Tonsillectomy and adenoidectomy  1979  . Lap band  08/2003  . Hernia repair  08/2006  . 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 Marry Guan  . Septoplasty  04/1978  . Back surgery  04/17/13   Social History   Social History  . Marital Status: Married    Spouse Name: N/A  . Number of  Children: N/A  . Years of Education: N/A   Social History Main Topics  . Smoking status: Never Smoker   . Smokeless tobacco: Never Used  . Alcohol Use: Yes     Comment: occ  . Drug Use: No  . Sexual Activity: Not Asked   Other Topics Concern  . None   Social History Narrative   Lives in [place] with husband.      Work - retired Sports administrator    Review of Systems  Constitutional: Negative for fever, chills, appetite change, fatigue and unexpected weight change.  Eyes: Negative for visual disturbance.  Respiratory: Negative for shortness of breath.   Cardiovascular: Negative for chest pain and leg swelling.  Gastrointestinal: Negative for nausea, vomiting, abdominal pain, diarrhea and constipation.  Genitourinary: Negative for dysuria, urgency, frequency and pelvic pain.  Musculoskeletal: Positive for myalgias, back pain, arthralgias and gait problem.  Skin: Negative for color change and rash.  Hematological: Negative for adenopathy. Does not bruise/bleed easily.  Psychiatric/Behavioral: Negative for sleep disturbance and dysphoric mood. The patient is not nervous/anxious.        Objective:  BP 95/67 mmHg  Pulse 58  Temp(Src) 98.5 F (36.9 C) (Oral)  Ht 5\' 5"  (1.651 m)  Wt 232 lb 4 oz (105.348 kg)  BMI 38.65 kg/m2  SpO2 95% Physical Exam  Constitutional: She is oriented to person, place, and time. She appears well-developed and well-nourished. No distress.  HENT:  Head: Normocephalic and atraumatic.  Right Ear: External ear normal.  Left Ear: External ear normal.  Nose: Nose normal.  Mouth/Throat: Oropharynx is clear and moist. No oropharyngeal exudate.  Eyes: Conjunctivae are normal. Pupils are equal, round, and reactive to light. Right eye exhibits no discharge. Left eye exhibits no discharge. No scleral icterus.  Neck: Normal range of motion. Neck supple. No tracheal deviation present. No thyromegaly present.  Cardiovascular: Normal rate, regular rhythm,  normal heart sounds and intact distal pulses.  Exam reveals no gallop and no friction rub.   No murmur heard. Pulmonary/Chest: Effort normal and breath sounds normal. No respiratory distress. She has no wheezes. She has no rales. She exhibits no tenderness.  Musculoskeletal: She exhibits no edema or tenderness.       Lumbar back: She exhibits decreased range of motion and pain. She exhibits no tenderness.  Lymphadenopathy:    She has no cervical adenopathy.  Neurological: She is alert and oriented to person, place, and time. No cranial nerve deficit. She exhibits normal muscle tone. Coordination normal.  Skin: Skin is warm and dry. No rash noted. She is not diaphoretic. No erythema. No pallor.  Psychiatric: She has a normal mood and affect. Her behavior is normal. Judgment and thought content normal.          Assessment & Plan:   Problem List Items Addressed This Visit      Unprioritized   Elevated blood sugar    Will recheck A1c with labs. We discussed that she may need insulin coverage while inpatient for upcoming surgery, as she has needed this in the past.      Relevant Orders   Hemoglobin A1c   Hypertension    BP Readings from Last 3 Encounters:  11/22/15 95/67  08/30/15 134/77  08/25/15 114/72   BP well controlled. Renal function with labs. Continue current medication.      Neuritis or radiculitis due to rupture of lumbar intervertebral disc - Primary    Reviewed MRI lumbar spine. Scheduled for lumbar fusion on 1/17. Will continue Tramadol for pain. Follow up after surgery.      Preop examination    Will get preop labs today. She also has Preop with anesthesia on Friday. She has been clinically stable. Risk of perioperative cardiac events low. Recommend proceed with surgery.      Relevant Orders   POCT urinalysis dipstick   Protime-INR   CBC with Differential/Platelet   Comprehensive metabolic panel   Lipid panel   Rhinitis, allergic    Symptoms well  controlled with Flonase. Will continue.      Relevant Medications   fluticasone (FLONASE) 50 MCG/ACT nasal spray       Return in about 3 months (around 02/20/2016) for Recheck.

## 2015-11-22 NOTE — Assessment & Plan Note (Signed)
BP Readings from Last 3 Encounters:  11/22/15 95/67  08/30/15 134/77  08/25/15 114/72   BP well controlled. Renal function with labs. Continue current medication.

## 2015-11-22 NOTE — Assessment & Plan Note (Signed)
Reviewed MRI lumbar spine. Scheduled for lumbar fusion on 1/17. Will continue Tramadol for pain. Follow up after surgery.

## 2015-11-22 NOTE — Assessment & Plan Note (Signed)
Will get preop labs today. She also has Preop with anesthesia on Friday. She has been clinically stable. Risk of perioperative cardiac events low. Recommend proceed with surgery.

## 2015-11-22 NOTE — Assessment & Plan Note (Signed)
Symptoms well controlled with Flonase. Will continue. 

## 2015-11-22 NOTE — Patient Instructions (Addendum)
Labs today.  Follow up in 3 months or sooner as needed. 

## 2015-11-22 NOTE — Addendum Note (Signed)
Addended by: Karlene Einstein D on: 11/22/2015 02:41 PM   Modules accepted: Orders

## 2015-11-22 NOTE — Progress Notes (Signed)
Pre visit review using our clinic review tool, if applicable. No additional management support is needed unless otherwise documented below in the visit note. 

## 2015-11-23 DIAGNOSIS — R8299 Other abnormal findings in urine: Secondary | ICD-10-CM | POA: Diagnosis not present

## 2015-11-23 DIAGNOSIS — M5116 Intervertebral disc disorders with radiculopathy, lumbar region: Secondary | ICD-10-CM | POA: Diagnosis not present

## 2015-11-23 DIAGNOSIS — J309 Allergic rhinitis, unspecified: Secondary | ICD-10-CM | POA: Diagnosis not present

## 2015-11-23 DIAGNOSIS — I10 Essential (primary) hypertension: Secondary | ICD-10-CM | POA: Diagnosis not present

## 2015-11-23 DIAGNOSIS — R7309 Other abnormal glucose: Secondary | ICD-10-CM | POA: Diagnosis not present

## 2015-11-23 DIAGNOSIS — Z01818 Encounter for other preprocedural examination: Secondary | ICD-10-CM | POA: Diagnosis not present

## 2015-11-23 NOTE — Addendum Note (Signed)
Addended by: Karlene Einstein D on: 11/23/2015 08:03 AM   Modules accepted: Orders

## 2015-11-24 LAB — URINE CULTURE: Colony Count: 9000

## 2015-11-25 ENCOUNTER — Encounter (HOSPITAL_COMMUNITY)
Admission: RE | Admit: 2015-11-25 | Discharge: 2015-11-25 | Disposition: A | Discharge status: Home / Self Care | Payer: PPO | Source: Ambulatory Visit | Attending: Neurological Surgery | Admitting: Neurological Surgery

## 2015-11-25 ENCOUNTER — Encounter (HOSPITAL_COMMUNITY): Payer: Self-pay

## 2015-11-25 DIAGNOSIS — K219 Gastro-esophageal reflux disease without esophagitis: Secondary | ICD-10-CM | POA: Diagnosis not present

## 2015-11-25 DIAGNOSIS — R001 Bradycardia, unspecified: Secondary | ICD-10-CM | POA: Insufficient documentation

## 2015-11-25 DIAGNOSIS — I10 Essential (primary) hypertension: Secondary | ICD-10-CM | POA: Insufficient documentation

## 2015-11-25 DIAGNOSIS — Z981 Arthrodesis status: Secondary | ICD-10-CM | POA: Insufficient documentation

## 2015-11-25 DIAGNOSIS — E785 Hyperlipidemia, unspecified: Secondary | ICD-10-CM | POA: Diagnosis not present

## 2015-11-25 DIAGNOSIS — Z01818 Encounter for other preprocedural examination: Secondary | ICD-10-CM | POA: Insufficient documentation

## 2015-11-25 DIAGNOSIS — Z79899 Other long term (current) drug therapy: Secondary | ICD-10-CM | POA: Diagnosis not present

## 2015-11-25 DIAGNOSIS — M4800 Spinal stenosis, site unspecified: Secondary | ICD-10-CM | POA: Insufficient documentation

## 2015-11-25 DIAGNOSIS — M4316 Spondylolisthesis, lumbar region: Secondary | ICD-10-CM | POA: Insufficient documentation

## 2015-11-25 DIAGNOSIS — Z96641 Presence of right artificial hip joint: Secondary | ICD-10-CM | POA: Diagnosis not present

## 2015-11-25 DIAGNOSIS — Z0183 Encounter for blood typing: Secondary | ICD-10-CM | POA: Insufficient documentation

## 2015-11-25 DIAGNOSIS — Z01812 Encounter for preprocedural laboratory examination: Secondary | ICD-10-CM | POA: Insufficient documentation

## 2015-11-25 LAB — SURGICAL PCR SCREEN
MRSA, PCR: NEGATIVE
Staphylococcus aureus: NEGATIVE

## 2015-11-25 LAB — TYPE AND SCREEN
ABO/RH(D): O POS
Antibody Screen: NEGATIVE

## 2015-11-25 NOTE — Pre-Procedure Instructions (Signed)
    ADITRI METHOT  11/25/2015      [facility] - [place], [place] [address] [address] Phone: [phone] Fax: [phone]    Your procedure is scheduled on Tuesday, Jan. 17  Report to [facility] at 5:30 A.M.  Call this number if you have problems the morning of surgery:  [phone]   Remember:  Do not eat food or drink liquids after midnight on Jan. 16  Take these medicines the morning of surgery with A SIP OF WATER :albuterol neb. If needed, flexeril if needed, allegra, flonase nasal spray, gabapentin, metoprolol,omeprazole, zoloft, tramadol if needed             Stop naparoxen. Don't take any NSAID: advil, motrin, ibuprofen, BC'S and herbal medicines/supplments 1 week prior to surgery    Do not wear jewelry, make-up or nail polish.  Do not wear lotions, powders, or perfumes.  You may not wear deodorant.  Do not shave 48 hours prior to surgery.  Men may shave face and neck.  Do not bring valuables to the hospital.  [facility] is not responsible for any belongings or valuables.  Contacts, dentures or bridgework may not be worn into surgery.  Leave your suitcase in the car.  After surgery it may be brought to your room.  For patients admitted to the hospital, discharge time will be determined by your treatment team.  Patients discharged the day of surgery will not be allowed to drive home.    Special instructions: review preparing for surgery  Please read over the following fact sheets that you were given. Pain Booklet, Coughing and Deep Breathing, Blood Transfusion Information, MRSA Information and Surgical Site Infection Prevention

## 2015-11-25 NOTE — Progress Notes (Signed)
PCP: Dr. Briscoe Deutscher in [place]. Reports she has echo/stress yrs. Ago at [facility] normal.  Denies having cardiologist,chest pain or SOB.

## 2015-11-28 NOTE — Progress Notes (Addendum)
Anesthesia chart review: Patient is a 70 year old female scheduled for L5-S1 PLIF, add on from L2-5 fusion on 12/06/2015 by Dr. Ellene Route.  History includes arthritis, GERD, hypertension, hyperlipidemia, spinal stenosis with chronic back pain, hyperglycemia, cholecystectomy, appendectomy, TNA, hysterectomy, lap band surgery, septoplasty, right THA, L2-5 fusion '14. She reported prior "normal" stress and echo at [facility] which were done > 5 years ago.  PCP is Dr. Ronette Deter with [facility]. She had a follow-up visit with pre-operative evaluation on 11/22/15. Dr. Gilford Rile felt patient was okay to proceed with surgery.   Meds include albuterol, Xanax, Flexeril, Allegra, Flonase, Neurontin, losartan-HCTZ, Toprol-XL, Prilosec, Zoloft, Zocor, tramadol, trazodone.  11/25/15 EKG: SB at 52 bpm, LAD, LVH with QRS widening, non-specific T wave abnormality. Since last tracing 08/27/14, no significant change. Also appears similar to 02/25/12 tracing except QRS complex has widened from 138ms to 128ms.  11/22/15 - 11/25/15 labs noted. A1c 6.2. Urine culture showed insignificant growth.  Her EKG appears stable. She has medical clearance. If no acute changes then I anticipate that she can proceed as planned.  George Hugh [facility] Short Stay Center/Anesthesiology Phone [phone] 11/28/2015 6:40 PM

## 2015-12-05 MED ORDER — CEFAZOLIN SODIUM-DEXTROSE 2-3 GM-% IV SOLR
2.0000 g | INTRAVENOUS | Status: AC
  Administered 2015-12-06: 2 g via INTRAVENOUS
  Filled 2015-12-05: qty 50

## 2015-12-05 NOTE — H&P (Signed)
Erin Camacho is an 70 y.o. female.   Chief Complaint: Back and leg pain HPI: Erin Camacho is a 71 year old individual whom I cared for in the past she's had lumbar degenerative changes from L2-L5 and is undergone a decompression and fusion from L2-L5. She's done well for a number of years but recently has been having it increasing back pain and leg pain with a catching sensation in the left lower extremity this is become increasingly intolerable and she was seen by Dr. Paula Compton did a number of epidural sterile injections and she is undergone all forms of exercise and conservative management to help this problem down unfortunately she is developed a significant stenosis at the L5-S1 level as was demonstrated recently on MRI after careful consideration of her options I advised surgical decompression at L5-S1 with extension of her fusion down to the sacrum. She is now being admitted for this procedure.  Past Medical History  Diagnosis Date  . Arthritis   . GERD (gastroesophageal reflux disease)   . Allergy     hay fever  . Hypertension   . Hyperlipidemia   . UTI (lower urinary tract infection)   . Spinal stenosis   . Chronic back pain   . Pneumonia   . Diabetes mellitus     no meds      pt states she is not diabetic    Past Surgical History  Procedure Laterality Date  . Cholecystectomy  1995  . Appendectomy    . Abdominal hysterectomy  1973  . Tonsillectomy and adenoidectomy  1979  . Lap band  08/2003  . Hernia repair  08/2006  . 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 Marry Guan  . Septoplasty  04/1978  . Back surgery  04/17/13    Family History  Problem Relation Age of Onset  . Hypertension Mother   . Stroke Mother   . Heart disease Mother   . Hypertension Father   . Stroke Father   . Heart disease Father   . Cancer Other     breast  . Diabetes Other   . Arthritis Sister   . Diabetes Sister   . Dementia Sister   . Breast cancer Sister   . Diabetes Brother    . Diabetes Brother   . Breast cancer Other 46   Social History:  reports that she has never smoked. She has never used smokeless tobacco. She reports that she drinks alcohol. She reports that she does not use illicit drugs.  Allergies: No Known Allergies  No prescriptions prior to admission    No results found for this or any previous visit (from the past 48 hour(s)). No results found.  Review of Systems  Constitutional: Negative.   HENT: Negative.   Eyes: Positive for redness.  Respiratory: Negative.   Cardiovascular: Negative.   Gastrointestinal: Negative.   Genitourinary: Negative.   Musculoskeletal: Positive for back pain.  Skin: Negative.   Neurological:       Leg pain with walking left greater than right  Psychiatric/Behavioral: Negative.     There were no vitals taken for this visit. Physical Exam  Constitutional: She is oriented to person, place, and time. She appears well-developed and well-nourished.  HENT:  Head: Normocephalic and atraumatic.  Eyes: Conjunctivae are normal. Pupils are equal, round, and reactive to light.  Neck: Normal range of motion. Neck supple.  Cardiovascular: Normal rate and regular rhythm.   Respiratory: Effort normal and breath sounds normal.  GI: Soft. Bowel sounds are normal.  Musculoskeletal:  Moderate back pain to pad caution straight leg raising positive at 45 on either lower extremity Patrick's maneuver is negative bilaterally  Neurological: She is alert and oriented to person, place, and time.  Absent lower extremity reflexes with good strength in iliopsoas quadriceps tibialis anterior and gastrocs. Cranial nerve examination is normal. Station and gait are intact  Skin: Skin is warm and dry.  Psychiatric: She has a normal mood and affect. Her behavior is normal. Judgment and thought content normal.     Assessment/Plan IMPRESSION/PLAN:                             I discussed the findings with Erin Camacho.  I indicated that at  this point if the back pain and some radicular symptoms are what are most severe, then I believe that an epidural steroid injection at the lowest lumbar spinal level would be of order.  She tells me, though, that Dr. Leanord Hawking has done a number of injections on her back, the last one being just a couple of months ago.  These have had progressively declining results and is why she is being seen now for this workup.  I reviewed the findings on the MRI with her and I note that she has a degenerative spondylolisthesis and she has foraminal stenosis.  Given the fact that she is experiencing some radicular pain in this distribution, it would be best ultimately to stabilize the L5-S1 joint with decompression of the foramen, decompression of the posterior interbody space, and stabilization from the hardware from L2 to L5 to L5-S1.  This would be done as an add-on fusion and though it is not as big as the surgery that she had done a few years back, it is still a significant operation.  Erin Camacho tells me that if this stands some chance of giving her some relief of her symptoms, at this point she would be interested in pursuing this.  I believe that this is quite reasonable now, particularly given the fact that she has failed a number of epidural steroid injections and other conservative efforts to help control this pain.  She is now admitted for surgery.              Edvin Albus J 12/05/2015, 8:52 PM

## 2015-12-05 NOTE — Anesthesia Preprocedure Evaluation (Addendum)
Anesthesia Evaluation  Patient identified by MRN, date of birth, ID band Patient awake    Reviewed: Allergy & Precautions, H&P , NPO status , Patient's Chart, lab work & pertinent test results  Airway Mallampati: II       Dental  (+) Teeth Intact, Dental Advisory Given   Pulmonary neg pneumonia , Recent URI , Resolved,         breath sounds clear to auscultation       Cardiovascular Exercise Tolerance: Good hypertension, Pt. on medications and Pt. on home beta blockers  Rhythm:Regular Rate:Normal  EKG Sinus Bradycardia -Anter. Fasicular block, LVH   Neuro/Psych Anxiety Depression Pain Right leg only  Neuromuscular disease    GI/Hepatic Neg liver ROS, GERD  Medicated and Poorly Controlled,History noted.  SP LAP BAND SURGERY   Endo/Other  diabetes, Well Controlled, Type 2Morbid obesityGlucose 115 0n 04-17-13 @ 0559  Renal/GU negative Renal ROS     Musculoskeletal  (+) Arthritis , Osteoarthritis,    Abdominal (+) + obese,   Peds  Hematology negative hematology ROS (+) 13/41   Anesthesia Other Findings   Reproductive/Obstetrics negative OB ROS                           Anesthesia Physical  Anesthesia Plan  ASA: III  Anesthesia Plan: General   Post-op Pain Management:    Induction: Intravenous  Airway Management Planned: Oral ETT  Additional Equipment:   Intra-op Plan:   Post-operative Plan: Extubation in OR  Informed Consent: I have reviewed the patients History and Physical, chart, labs and discussed the procedure including the risks, benefits and alternatives for the proposed anesthesia with the patient or authorized representative who has indicated his/her understanding and acceptance.   Dental advisory given  Plan Discussed with: CRNA, Anesthesiologist and Surgeon  Anesthesia Plan Comments: (Multi modal pain rx, consider 2nd IV after turning, verify T & S)        Anesthesia Quick Evaluation

## 2015-12-06 ENCOUNTER — Inpatient Hospital Stay (HOSPITAL_COMMUNITY)
Admission: RE | Admit: 2015-12-06 | Discharge: 2015-12-08 | DRG: 460 | Disposition: A | Discharge status: Skilled Nursing Facility | Payer: PPO | Source: Ambulatory Visit | Attending: Neurological Surgery | Admitting: Neurological Surgery

## 2015-12-06 ENCOUNTER — Inpatient Hospital Stay (HOSPITAL_COMMUNITY): Payer: PPO

## 2015-12-06 ENCOUNTER — Inpatient Hospital Stay (HOSPITAL_COMMUNITY): Payer: PPO | Admitting: Anesthesiology

## 2015-12-06 ENCOUNTER — Inpatient Hospital Stay (HOSPITAL_COMMUNITY): Payer: PPO | Admitting: Vascular Surgery

## 2015-12-06 ENCOUNTER — Encounter (HOSPITAL_COMMUNITY)
Admission: RE | Disposition: A | Discharge status: Skilled Nursing Facility | Payer: PPO | Source: Ambulatory Visit | Attending: Neurological Surgery

## 2015-12-06 DIAGNOSIS — Z96641 Presence of right artificial hip joint: Secondary | ICD-10-CM | POA: Diagnosis present

## 2015-12-06 DIAGNOSIS — Z981 Arthrodesis status: Secondary | ICD-10-CM | POA: Diagnosis not present

## 2015-12-06 DIAGNOSIS — M5416 Radiculopathy, lumbar region: Secondary | ICD-10-CM | POA: Diagnosis present

## 2015-12-06 DIAGNOSIS — Z9884 Bariatric surgery status: Secondary | ICD-10-CM | POA: Diagnosis not present

## 2015-12-06 DIAGNOSIS — M4317 Spondylolisthesis, lumbosacral region: Secondary | ICD-10-CM

## 2015-12-06 DIAGNOSIS — Z8249 Family history of ischemic heart disease and other diseases of the circulatory system: Secondary | ICD-10-CM | POA: Diagnosis not present

## 2015-12-06 DIAGNOSIS — K219 Gastro-esophageal reflux disease without esophagitis: Secondary | ICD-10-CM | POA: Diagnosis present

## 2015-12-06 DIAGNOSIS — M549 Dorsalgia, unspecified: Secondary | ICD-10-CM

## 2015-12-06 DIAGNOSIS — E785 Hyperlipidemia, unspecified: Secondary | ICD-10-CM | POA: Diagnosis present

## 2015-12-06 DIAGNOSIS — I10 Essential (primary) hypertension: Secondary | ICD-10-CM | POA: Diagnosis present

## 2015-12-06 DIAGNOSIS — M4806 Spinal stenosis, lumbar region: Principal | ICD-10-CM | POA: Diagnosis present

## 2015-12-06 DIAGNOSIS — M4316 Spondylolisthesis, lumbar region: Secondary | ICD-10-CM | POA: Diagnosis not present

## 2015-12-06 DIAGNOSIS — M5417 Radiculopathy, lumbosacral region: Secondary | ICD-10-CM | POA: Diagnosis not present

## 2015-12-06 DIAGNOSIS — M4326 Fusion of spine, lumbar region: Secondary | ICD-10-CM | POA: Diagnosis not present

## 2015-12-06 DIAGNOSIS — Z833 Family history of diabetes mellitus: Secondary | ICD-10-CM

## 2015-12-06 DIAGNOSIS — M1990 Unspecified osteoarthritis, unspecified site: Secondary | ICD-10-CM | POA: Diagnosis not present

## 2015-12-06 DIAGNOSIS — M79606 Pain in leg, unspecified: Secondary | ICD-10-CM | POA: Diagnosis present

## 2015-12-06 SURGERY — POSTERIOR LUMBAR FUSION 1 LEVEL
Anesthesia: General | Site: Back

## 2015-12-06 MED ORDER — SENNA 8.6 MG PO TABS
1.0000 | ORAL_TABLET | Freq: Two times a day (BID) | ORAL | Status: DC
  Administered 2015-12-06 – 2015-12-08 (×4): 8.6 mg via ORAL
  Filled 2015-12-06 (×4): qty 1

## 2015-12-06 MED ORDER — FENTANYL CITRATE (PF) 250 MCG/5ML IJ SOLN
INTRAMUSCULAR | Status: AC
  Filled 2015-12-06: qty 5

## 2015-12-06 MED ORDER — LIDOCAINE-EPINEPHRINE 1 %-1:100000 IJ SOLN
INTRAMUSCULAR | Status: DC | PRN
  Administered 2015-12-06: 5 mL

## 2015-12-06 MED ORDER — LIDOCAINE HCL (CARDIAC) 20 MG/ML IV SOLN
INTRAVENOUS | Status: AC
  Filled 2015-12-06: qty 5

## 2015-12-06 MED ORDER — DOCUSATE SODIUM 100 MG PO CAPS
100.0000 mg | ORAL_CAPSULE | Freq: Two times a day (BID) | ORAL | Status: DC
  Administered 2015-12-06 – 2015-12-08 (×4): 100 mg via ORAL
  Filled 2015-12-06 (×5): qty 1

## 2015-12-06 MED ORDER — MEPERIDINE HCL 25 MG/ML IJ SOLN: 6.2500 mg | INTRAMUSCULAR | Status: DC | PRN

## 2015-12-06 MED ORDER — OXYCODONE-ACETAMINOPHEN 5-325 MG PO TABS
1.0000 | ORAL_TABLET | ORAL | Status: DC | PRN
  Administered 2015-12-06: 2 via ORAL
  Administered 2015-12-06: 1 via ORAL
  Administered 2015-12-07: 2 via ORAL
  Administered 2015-12-07: 1 via ORAL
  Administered 2015-12-07: 2 via ORAL
  Administered 2015-12-07 (×2): 1 via ORAL
  Administered 2015-12-08 (×3): 2 via ORAL
  Filled 2015-12-06 (×4): qty 2
  Filled 2015-12-06 (×2): qty 1
  Filled 2015-12-06 (×2): qty 2
  Filled 2015-12-06 (×2): qty 1

## 2015-12-06 MED ORDER — HYDROMORPHONE HCL 1 MG/ML IJ SOLN
INTRAMUSCULAR | Status: AC
  Filled 2015-12-06: qty 1

## 2015-12-06 MED ORDER — ROCURONIUM BROMIDE 50 MG/5ML IV SOLN
INTRAVENOUS | Status: AC
  Filled 2015-12-06: qty 2

## 2015-12-06 MED ORDER — HYDROMORPHONE HCL 1 MG/ML IJ SOLN
0.2500 mg | INTRAMUSCULAR | Status: DC | PRN
  Administered 2015-12-06 (×4): 0.5 mg via INTRAVENOUS

## 2015-12-06 MED ORDER — CYCLOBENZAPRINE HCL 5 MG PO TABS: 5.0000 mg | ORAL_TABLET | Freq: Three times a day (TID) | ORAL | Status: DC | PRN

## 2015-12-06 MED ORDER — SODIUM CHLORIDE 0.9 % IV SOLN: 250.0000 mL | INTRAVENOUS | Status: DC

## 2015-12-06 MED ORDER — POLYETHYLENE GLYCOL 3350 17 G PO PACK: 17.0000 g | PACK | Freq: Every day | ORAL | Status: DC | PRN

## 2015-12-06 MED ORDER — THROMBIN 20000 UNITS EX SOLR
CUTANEOUS | Status: DC | PRN
  Administered 2015-12-06: 20 mL via TOPICAL

## 2015-12-06 MED ORDER — HYDROCODONE-ACETAMINOPHEN 5-325 MG PO TABS: 1.0000 | ORAL_TABLET | ORAL | Status: DC | PRN

## 2015-12-06 MED ORDER — SERTRALINE HCL 50 MG PO TABS
50.0000 mg | ORAL_TABLET | Freq: Every day | ORAL | Status: DC
  Administered 2015-12-06 – 2015-12-08 (×3): 50 mg via ORAL
  Filled 2015-12-06 (×3): qty 1

## 2015-12-06 MED ORDER — SUGAMMADEX SODIUM 200 MG/2ML IV SOLN
INTRAVENOUS | Status: DC | PRN
  Administered 2015-12-06: 200 mg via INTRAVENOUS

## 2015-12-06 MED ORDER — METOPROLOL SUCCINATE ER 25 MG PO TB24
25.0000 mg | ORAL_TABLET | Freq: Every day | ORAL | Status: DC
  Administered 2015-12-07 – 2015-12-08 (×2): 25 mg via ORAL
  Filled 2015-12-06 (×3): qty 1

## 2015-12-06 MED ORDER — KETOROLAC TROMETHAMINE 15 MG/ML IJ SOLN
15.0000 mg | Freq: Four times a day (QID) | INTRAMUSCULAR | Status: AC
  Administered 2015-12-06 – 2015-12-07 (×5): 15 mg via INTRAVENOUS
  Filled 2015-12-06 (×5): qty 1

## 2015-12-06 MED ORDER — HYDROMORPHONE HCL 1 MG/ML IJ SOLN: 0.5000 mg | INTRAMUSCULAR | Status: DC | PRN

## 2015-12-06 MED ORDER — 0.9 % SODIUM CHLORIDE (POUR BTL) OPTIME
TOPICAL | Status: DC | PRN
  Administered 2015-12-06: 1000 mL

## 2015-12-06 MED ORDER — BUPIVACAINE HCL (PF) 0.5 % IJ SOLN
INTRAMUSCULAR | Status: DC | PRN
  Administered 2015-12-06: 20 mL
  Administered 2015-12-06: 5 mL

## 2015-12-06 MED ORDER — SUGAMMADEX SODIUM 200 MG/2ML IV SOLN
INTRAVENOUS | Status: AC
  Filled 2015-12-06: qty 2

## 2015-12-06 MED ORDER — EPHEDRINE SULFATE 50 MG/ML IJ SOLN
INTRAMUSCULAR | Status: AC
  Filled 2015-12-06: qty 1

## 2015-12-06 MED ORDER — ROCURONIUM BROMIDE 100 MG/10ML IV SOLN
INTRAVENOUS | Status: DC | PRN
  Administered 2015-12-06: 20 mg via INTRAVENOUS
  Administered 2015-12-06: 10 mg via INTRAVENOUS
  Administered 2015-12-06: 50 mg via INTRAVENOUS

## 2015-12-06 MED ORDER — METOPROLOL SUCCINATE ER 25 MG PO TB24
25.0000 mg | ORAL_TABLET | Freq: Once | ORAL | Status: AC
  Administered 2015-12-06: 25 mg via ORAL
  Filled 2015-12-06: qty 1

## 2015-12-06 MED ORDER — PROMETHAZINE HCL 25 MG/ML IJ SOLN: 6.2500 mg | INTRAMUSCULAR | Status: DC | PRN

## 2015-12-06 MED ORDER — DEXAMETHASONE SODIUM PHOSPHATE 10 MG/ML IJ SOLN
INTRAMUSCULAR | Status: AC
  Filled 2015-12-06: qty 1

## 2015-12-06 MED ORDER — SIMVASTATIN 5 MG PO TABS
5.0000 mg | ORAL_TABLET | Freq: Every day | ORAL | Status: DC
  Administered 2015-12-06 – 2015-12-07 (×2): 5 mg via ORAL
  Filled 2015-12-06 (×3): qty 1

## 2015-12-06 MED ORDER — METHOCARBAMOL 1000 MG/10ML IJ SOLN: 500.0000 mg | Freq: Four times a day (QID) | INTRAVENOUS | Status: DC | PRN

## 2015-12-06 MED ORDER — GLYCOPYRROLATE 0.2 MG/ML IJ SOLN
INTRAMUSCULAR | Status: DC | PRN
  Administered 2015-12-06: 0.2 mg via INTRAVENOUS

## 2015-12-06 MED ORDER — NEOSTIGMINE METHYLSULFATE 10 MG/10ML IV SOLN
INTRAVENOUS | Status: AC
  Filled 2015-12-06: qty 1

## 2015-12-06 MED ORDER — THROMBIN 5000 UNITS EX SOLR
OROMUCOSAL | Status: DC | PRN
  Administered 2015-12-06: 10 mL via TOPICAL

## 2015-12-06 MED ORDER — TRAMADOL HCL 50 MG PO TABS
50.0000 mg | ORAL_TABLET | Freq: Every day | ORAL | Status: DC
  Administered 2015-12-07: 50 mg via ORAL
  Filled 2015-12-06: qty 1

## 2015-12-06 MED ORDER — PROPOFOL 10 MG/ML IV BOLUS
INTRAVENOUS | Status: AC
  Filled 2015-12-06: qty 20

## 2015-12-06 MED ORDER — ALBUMIN HUMAN 5 % IV SOLN
INTRAVENOUS | Status: DC | PRN
  Administered 2015-12-06 (×2): via INTRAVENOUS

## 2015-12-06 MED ORDER — ALUM & MAG HYDROXIDE-SIMETH 200-200-20 MG/5ML PO SUSP: 30.0000 mL | Freq: Four times a day (QID) | ORAL | Status: DC | PRN

## 2015-12-06 MED ORDER — PHENOL 1.4 % MT LIQD: 1.0000 | OROMUCOSAL | Status: DC | PRN

## 2015-12-06 MED ORDER — GLYCOPYRROLATE 0.2 MG/ML IJ SOLN
INTRAMUSCULAR | Status: AC
  Filled 2015-12-06: qty 3

## 2015-12-06 MED ORDER — LACTATED RINGERS IV SOLN
INTRAVENOUS | Status: DC | PRN
  Administered 2015-12-06 (×3): via INTRAVENOUS

## 2015-12-06 MED ORDER — ARTIFICIAL TEARS OP OINT
TOPICAL_OINTMENT | OPHTHALMIC | Status: AC
  Filled 2015-12-06: qty 3.5

## 2015-12-06 MED ORDER — PANTOPRAZOLE SODIUM 40 MG PO TBEC
40.0000 mg | DELAYED_RELEASE_TABLET | Freq: Every day | ORAL | Status: DC
  Administered 2015-12-08: 40 mg via ORAL
  Filled 2015-12-06 (×2): qty 1

## 2015-12-06 MED ORDER — MIDAZOLAM HCL 2 MG/2ML IJ SOLN
INTRAMUSCULAR | Status: AC
  Filled 2015-12-06: qty 2

## 2015-12-06 MED ORDER — HYDROCHLOROTHIAZIDE 12.5 MG PO CAPS
12.5000 mg | ORAL_CAPSULE | Freq: Every day | ORAL | Status: DC
  Administered 2015-12-07 – 2015-12-08 (×2): 12.5 mg via ORAL
  Filled 2015-12-06 (×2): qty 1

## 2015-12-06 MED ORDER — KETOROLAC TROMETHAMINE 15 MG/ML IJ SOLN
INTRAMUSCULAR | Status: AC
  Filled 2015-12-06: qty 1

## 2015-12-06 MED ORDER — MENTHOL 3 MG MT LOZG: 1.0000 | LOZENGE | OROMUCOSAL | Status: DC | PRN

## 2015-12-06 MED ORDER — DEXAMETHASONE SODIUM PHOSPHATE 4 MG/ML IJ SOLN
8.0000 mg | Freq: Once | INTRAMUSCULAR | Status: AC
  Administered 2015-12-06: 8 mg via INTRAVENOUS
  Filled 2015-12-06: qty 2

## 2015-12-06 MED ORDER — ONDANSETRON HCL 4 MG/2ML IJ SOLN: 4.0000 mg | INTRAMUSCULAR | Status: DC | PRN

## 2015-12-06 MED ORDER — MIDAZOLAM HCL 5 MG/5ML IJ SOLN
INTRAMUSCULAR | Status: DC | PRN
  Administered 2015-12-06: 2 mg via INTRAVENOUS

## 2015-12-06 MED ORDER — ONDANSETRON HCL 4 MG/2ML IJ SOLN
INTRAMUSCULAR | Status: DC | PRN
  Administered 2015-12-06: 4 mg via INTRAVENOUS

## 2015-12-06 MED ORDER — METHOCARBAMOL 500 MG PO TABS
500.0000 mg | ORAL_TABLET | Freq: Four times a day (QID) | ORAL | Status: DC | PRN
  Administered 2015-12-06 – 2015-12-08 (×3): 500 mg via ORAL
  Filled 2015-12-06 (×3): qty 1

## 2015-12-06 MED ORDER — LOSARTAN POTASSIUM-HCTZ 50-12.5 MG PO TABS: 1.0000 | ORAL_TABLET | Freq: Every day | ORAL | Status: DC

## 2015-12-06 MED ORDER — MAGNESIUM CITRATE PO SOLN: 1.0000 | Freq: Once | ORAL | Status: DC | PRN

## 2015-12-06 MED ORDER — PHENYLEPHRINE HCL 10 MG/ML IJ SOLN
INTRAMUSCULAR | Status: DC | PRN
  Administered 2015-12-06 (×2): 80 ug via INTRAVENOUS
  Administered 2015-12-06: 120 ug via INTRAVENOUS
  Administered 2015-12-06: 40 ug via INTRAVENOUS

## 2015-12-06 MED ORDER — SUCCINYLCHOLINE CHLORIDE 20 MG/ML IJ SOLN
INTRAMUSCULAR | Status: AC
  Filled 2015-12-06: qty 1

## 2015-12-06 MED ORDER — NEOSTIGMINE METHYLSULFATE 10 MG/10ML IV SOLN
INTRAVENOUS | Status: AC
  Filled 2015-12-06: qty 4

## 2015-12-06 MED ORDER — BISACODYL 10 MG RE SUPP: 10.0000 mg | Freq: Every day | RECTAL | Status: DC | PRN

## 2015-12-06 MED ORDER — LOSARTAN POTASSIUM 50 MG PO TABS
50.0000 mg | ORAL_TABLET | Freq: Every day | ORAL | Status: DC
  Administered 2015-12-07 – 2015-12-08 (×2): 50 mg via ORAL
  Filled 2015-12-06 (×2): qty 1

## 2015-12-06 MED ORDER — SODIUM CHLORIDE 0.9 % IR SOLN
Status: DC | PRN
  Administered 2015-12-06: 500 mL

## 2015-12-06 MED ORDER — LIDOCAINE HCL (CARDIAC) 20 MG/ML IV SOLN
INTRAVENOUS | Status: DC | PRN
  Administered 2015-12-06: 100 mg via INTRAVENOUS

## 2015-12-06 MED ORDER — ARTIFICIAL TEARS OP OINT
TOPICAL_OINTMENT | OPHTHALMIC | Status: DC | PRN
  Administered 2015-12-06: 1 "application " via OPHTHALMIC

## 2015-12-06 MED ORDER — LORATADINE 10 MG PO TABS
10.0000 mg | ORAL_TABLET | Freq: Every day | ORAL | Status: DC
  Administered 2015-12-06 – 2015-12-08 (×3): 10 mg via ORAL
  Filled 2015-12-06 (×3): qty 1

## 2015-12-06 MED ORDER — SODIUM CHLORIDE 0.9 % IV SOLN: INTRAVENOUS | Status: DC

## 2015-12-06 MED ORDER — CEFAZOLIN SODIUM 1-5 GM-% IV SOLN
1.0000 g | Freq: Three times a day (TID) | INTRAVENOUS | Status: AC
  Administered 2015-12-06 – 2015-12-07 (×2): 1 g via INTRAVENOUS
  Filled 2015-12-06 (×2): qty 50

## 2015-12-06 MED ORDER — ALPRAZOLAM 0.25 MG PO TABS: 0.2500 mg | ORAL_TABLET | Freq: Every evening | ORAL | Status: DC | PRN

## 2015-12-06 MED ORDER — GABAPENTIN 300 MG PO CAPS
300.0000 mg | ORAL_CAPSULE | Freq: Two times a day (BID) | ORAL | Status: DC
  Administered 2015-12-06 – 2015-12-08 (×4): 300 mg via ORAL
  Filled 2015-12-06 (×4): qty 1

## 2015-12-06 MED ORDER — DEXAMETHASONE SODIUM PHOSPHATE 10 MG/ML IJ SOLN
INTRAMUSCULAR | Status: DC | PRN
  Administered 2015-12-06: 10 mg via INTRAVENOUS

## 2015-12-06 MED ORDER — PROPOFOL 10 MG/ML IV BOLUS
INTRAVENOUS | Status: DC | PRN
  Administered 2015-12-06: 170 mg via INTRAVENOUS

## 2015-12-06 MED ORDER — TRAZODONE HCL 100 MG PO TABS
100.0000 mg | ORAL_TABLET | Freq: Every day | ORAL | Status: DC
  Administered 2015-12-06 – 2015-12-07 (×2): 100 mg via ORAL
  Filled 2015-12-06 (×3): qty 1

## 2015-12-06 MED ORDER — SODIUM CHLORIDE 0.9 % IJ SOLN
3.0000 mL | Freq: Two times a day (BID) | INTRAMUSCULAR | Status: DC
  Administered 2015-12-06 – 2015-12-07 (×2): 3 mL via INTRAVENOUS

## 2015-12-06 MED ORDER — FLUTICASONE PROPIONATE 50 MCG/ACT NA SUSP
2.0000 | Freq: Every day | NASAL | Status: DC
  Administered 2015-12-06 – 2015-12-08 (×3): 2 via NASAL
  Filled 2015-12-06: qty 16

## 2015-12-06 MED ORDER — PHENYLEPHRINE 40 MCG/ML (10ML) SYRINGE FOR IV PUSH (FOR BLOOD PRESSURE SUPPORT)
PREFILLED_SYRINGE | INTRAVENOUS | Status: AC
  Filled 2015-12-06: qty 10

## 2015-12-06 MED ORDER — FENTANYL CITRATE (PF) 100 MCG/2ML IJ SOLN
INTRAMUSCULAR | Status: DC | PRN
  Administered 2015-12-06: 50 ug via INTRAVENOUS
  Administered 2015-12-06: 100 ug via INTRAVENOUS
  Administered 2015-12-06 (×2): 50 ug via INTRAVENOUS

## 2015-12-06 MED ORDER — SODIUM CHLORIDE 0.9 % IJ SOLN: 3.0000 mL | INTRAMUSCULAR | Status: DC | PRN

## 2015-12-06 MED ORDER — SODIUM CHLORIDE 0.9 % IJ SOLN
INTRAMUSCULAR | Status: AC
  Filled 2015-12-06: qty 10

## 2015-12-06 MED FILL — Sodium Chloride IV Soln 0.9%: INTRAVENOUS | Qty: 1000 | Status: AC

## 2015-12-06 MED FILL — Heparin Sodium (Porcine) Inj 1000 Unit/ML: INTRAMUSCULAR | Qty: 30 | Status: AC

## 2015-12-06 SURGICAL SUPPLY — 58 items
BAG DECANTER FOR FLEXI CONT (MISCELLANEOUS) ×2
BLADE CLIPPER SURG (BLADE)
BUR MATCHSTICK NEURO 3.0 LAGG (BURR) ×2
CAGE COROENT LRG 8X9X28M SPINE (Cage) ×4 IMPLANT
CANISTER SUCT 3000ML PPV (MISCELLANEOUS) ×2
CONNECTOR RELINE ROTATE 5-6MM (Connector) ×4 IMPLANT
CONT SPEC 4OZ CLIKSEAL STRL BL (MISCELLANEOUS) ×2
COVER BACK TABLE 60X90IN (DRAPES) ×2
DECANTER SPIKE VIAL GLASS SM (MISCELLANEOUS) ×2
DERMABOND ADHESIVE PROPEN (GAUZE/BANDAGES/DRESSINGS) ×1
DERMABOND ADVANCED (GAUZE/BANDAGES/DRESSINGS) ×1
DERMABOND ADVANCED .7 DNX12 (GAUZE/BANDAGES/DRESSINGS) ×1
DERMABOND ADVANCED .7 DNX6 (GAUZE/BANDAGES/DRESSINGS) ×1
DRAPE C-ARM 42X72 X-RAY (DRAPES) ×4
DRAPE LAPAROTOMY 100X72X124 (DRAPES) ×2
DRAPE POUCH INSTRU U-SHP 10X18 (DRAPES) ×2
DRAPE PROXIMA HALF (DRAPES)
DRSG OPSITE POSTOP 4X8 (GAUZE/BANDAGES/DRESSINGS) ×2
DURAPREP 26ML APPLICATOR (WOUND CARE) ×2
ELECT REM PT RETURN 9FT ADLT (ELECTROSURGICAL) ×2
GAUZE SPONGE 4X4 12PLY STRL (GAUZE/BANDAGES/DRESSINGS) ×2
GAUZE SPONGE 4X4 16PLY XRAY LF (GAUZE/BANDAGES/DRESSINGS)
GLOVE BIOGEL PI IND STRL 8.5 (GLOVE) ×2
GLOVE BIOGEL PI INDICATOR 8.5 (GLOVE) ×2
GLOVE ECLIPSE 8.5 STRL (GLOVE) ×4
GLOVE EXAM NITRILE LRG STRL (GLOVE)
GLOVE EXAM NITRILE MD LF STRL (GLOVE)
GLOVE EXAM NITRILE XL STR (GLOVE)
GLOVE EXAM NITRILE XS STR PU (GLOVE)
GOWN STRL REUS W/ TWL LRG LVL3 (GOWN DISPOSABLE)
GOWN STRL REUS W/ TWL XL LVL3 (GOWN DISPOSABLE)
GOWN STRL REUS W/TWL 2XL LVL3 (GOWN DISPOSABLE) ×4
GOWN STRL REUS W/TWL LRG LVL3 (GOWN DISPOSABLE)
GOWN STRL REUS W/TWL XL LVL3 (GOWN DISPOSABLE)
HEMOSTAT POWDER KIT SURGIFOAM (HEMOSTASIS)
KIT BASIN OR (CUSTOM PROCEDURE TRAY) ×2
KIT INFUSE X SMALL 1.4CC (Orthopedic Implant) ×2 IMPLANT
KIT ROOM TURNOVER OR (KITS) ×2
NEEDLE HYPO 22GX1.5 SAFETY (NEEDLE) ×2
NS IRRIG 1000ML POUR BTL (IV SOLUTION) ×2
PACK LAMINECTOMY NEURO (CUSTOM PROCEDURE TRAY) ×2
PAD ARMBOARD 7.5X6 YLW CONV (MISCELLANEOUS) ×6
PATTIES SURGICAL .5 X1 (DISPOSABLE) ×2
ROD RELINE-O LORD 5.5X50MM (Rod) ×4 IMPLANT
SCREW LOCK RELINE 5.5 TULIP (Screw) ×12 IMPLANT
SCREW RELINE 6.5X35 POLYAXIAL (Screw) ×4 IMPLANT
SPONGE LAP 4X18 X RAY DECT (DISPOSABLE)
SPONGE SURGIFOAM ABS GEL 100 (HEMOSTASIS) ×2
SUT VIC AB 1 CT1 18XBRD ANBCTR (SUTURE) ×1
SUT VIC AB 1 CT1 8-18 (SUTURE) ×1
SUT VIC AB 2-0 CP2 18 (SUTURE) ×2
SUT VIC AB 3-0 SH 8-18 (SUTURE) ×4
SYR 3ML LL SCALE MARK (SYRINGE) ×8
TOWEL OR 17X24 6PK STRL BLUE (TOWEL DISPOSABLE) ×2
TOWEL OR 17X26 10 PK STRL BLUE (TOWEL DISPOSABLE) ×2
TRAP SPECIMEN MUCOUS 40CC (MISCELLANEOUS) ×2
TRAY FOLEY W/METER SILVER 14FR (SET/KITS/TRAYS/PACK) ×2
WATER STERILE IRR 1000ML POUR (IV SOLUTION) ×2

## 2015-12-06 NOTE — Transfer of Care (Signed)
Immediate Anesthesia Transfer of Care Note  Patient: Erin Camacho  Procedure(s) Performed: Procedure(s) with comments: L5-S1 Posterior lumbar interbody fusion, add on from L2-5 Fusion (N/A) - L5-S1 Posterior lumbar interbody fusion, add on from L2-5 Fusion  Patient Location: PACU  Anesthesia Type:General  Level of Consciousness: awake, alert  and oriented  Airway & Oxygen Therapy: Patient Spontanous Breathing and Patient connected to nasal cannula oxygen  Post-op Assessment: Report given to RN and Post -op Vital signs reviewed and stable  Post vital signs: Reviewed and stable  Last Vitals:  Filed Vitals:   12/06/15 0622  BP: 150/73  Pulse: 58  Temp: 37.7 C  Resp: 20    Complications: No apparent anesthesia complications

## 2015-12-06 NOTE — Progress Notes (Signed)
Utilization review completed.  

## 2015-12-06 NOTE — Op Note (Signed)
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 First assistant: Keturah Barre M.D. Anesthesia: Gen. endotracheal Indications: Erin Camacho is a 70 year old individual is at previous decompression fusion from L2-L5. She has done well for several years however now she is developed increasing buttock pain and back pain at the lumbosacral junction she has evidence of a degenerative changes at L5-S1 with severe lateral recess stenosis. Been advised regarding the need for surgical decompression and stabilization at L5-S1 and this is now being performed.  Procedure: The patient was brought to the operating supine on a stretcher. After the smooth induction of general endotracheal anesthesia, she was turned prone. Back was prepped with alcohol DuraPrep and draped in a sterile fashion. Midline incision was created. Was carried down to the midline. This is on the lower portion of her previously made incision. Dissection wasn't carried out to the lateral gutters were the screw and L5 was exposed and a portion of the rod between L4 and L5 screws was also exposed. Its was carefully uncovered and bone in the interval between the screw heads was then removed using a high-speed drill and a small osteotome. Ultimately a transverse connector was placed on the rod segment between L4 and L5 screws bilaterally. This was tightened and the position. Attention was then turned to L5-S1 were laminectomy of L5 was performed removing the entirety of the facet at the L5-S1 junction. Then the path for the L5 nerve root superiorly was uncovered and there was noted to be severe  stenosis in the lateral recesses for the L5 nerve root. Once this was decompressed and the L5 nerve root was isolated and decompressed itself S1 was decompressed then in a similar fashion. Common dural tube could then be mobilized off of a significantly bulging disc particularly on the left side. Once this was isolated the disc space was incised and a complete discectomy was performed at L5-S1 removing the entirety of the disc space and decorticating the endplates to allow for good fusion surface. This was done both on left and on the right sides. Interspace was then distracted to reduce the spondylolisthesis and it was felt that ultimately a 28 mm long 8 lordotic 8 mm tall spacer would fit best into this interval. This was filled with autograft and allograft and strips of infuse were placed into the interspace. Once this was positioned under radiographic confirmation pedicle entry sites were chosen at S1. 6.5 x 35 mm screws were found to give bicortical purchase in the S1 vertebrae. When he screws were placed a recontoured 50 mm rod was used to connect the side connector to the pedicle screw is S1. These were then torqued out of mutual construct. Final radiographs were obtained in AP and lateral projection and prior to closure the lateral gutters which were previously decorticated were packed with the remainder of the autograft and infuse. Hemostasis was checked in the surgical bed and the pads of the L5 nerve root laterally S1 nerve root inferiorly were checked to be free and clear once this was verified lumbar dorsal fascia was closed with #1 Vicryls interrupted fashion 20 Vicryls using saphenous tissues and 30 Vicryls subcuticularly. Blood loss was estimated at 200 mL.

## 2015-12-06 NOTE — Progress Notes (Signed)
Orthopedic Tech Progress Note Patient Details:  Erin Camacho [birth year] [id number] Patient already has brace at bedside. Patient ID: Erin Camacho, female   DOB: 05-29-1946, 70 y.o.   MRN: [id number]   Braulio Bosch 12/06/2015, 4:06 PM

## 2015-12-06 NOTE — Progress Notes (Signed)
Patient ID: Erin Camacho, female   DOB: [birth year], 69 y.o.   MRN: [id number] Vital signs are stable Patient is awake and alert moving both lower extremities well Incision is clean dry

## 2015-12-06 NOTE — Anesthesia Procedure Notes (Signed)
Procedure Name: Intubation Date/Time: 12/06/2015 7:48 AM Performed by: Tamala Fothergill S Patient Re-evaluated:Patient Re-evaluated prior to inductionOxygen Delivery Method: Circle system utilized Preoxygenation: Pre-oxygenation with 100% oxygen Intubation Type: IV induction Laryngoscope Size: Miller and 2 Grade View: Grade II Tube type: Oral Tube size: 7.5 mm Number of attempts: 1 Placement Confirmation: ETT inserted through vocal cords under direct vision,  breath sounds checked- equal and bilateral and positive ETCO2 Tube secured with: Tape Dental Injury: Teeth and Oropharynx as per pre-operative assessment

## 2015-12-06 NOTE — Anesthesia Postprocedure Evaluation (Signed)
Anesthesia Post Note  Patient: [name]  Procedure(s) Performed: Procedure(s) (LRB): L5-S1 Posterior lumbar interbody fusion, add on from L2-5 Fusion (N/A)  Patient location during evaluation: PACU Anesthesia Type: General Level of consciousness: awake and alert Pain management: pain level controlled Vital Signs Assessment: post-procedure vital signs reviewed and stable Respiratory status: spontaneous breathing, nonlabored ventilation, respiratory function stable and patient connected to nasal cannula oxygen Cardiovascular status: blood pressure returned to baseline and stable Postop Assessment: no signs of nausea or vomiting Anesthetic complications: no    Last Vitals:  Filed Vitals:   12/06/15 1130 12/06/15 1145  BP: 137/78 144/67  Pulse: 88 74  Temp: 36.6 C   Resp: 28 17    Last Pain:  Filed Vitals:   12/06/15 1153  PainSc: 10-Worst pain ever                 Raylen Tangonan

## 2015-12-07 MED ORDER — DEXAMETHASONE 4 MG PO TABS
2.0000 mg | ORAL_TABLET | Freq: Two times a day (BID) | ORAL | Status: DC
  Administered 2015-12-07 – 2015-12-08 (×3): 2 mg via ORAL
  Filled 2015-12-07 (×2): qty 1

## 2015-12-07 NOTE — Evaluation (Signed)
Physical Therapy Evaluation Patient Details Name: Erin Camacho MRN: [id number] DOB: March 18, 1946 Today's Date: 12/07/2015   History of Present Illness  Pt is a 70 y/o female who presents s/p L5-S1 PLIF on 12/06/15.  Clinical Impression  Pt admitted with above diagnosis. Pt currently with functional limitations due to the deficits listed below (see PT Problem List). At the time of PT eval pt was able to perform transfers and ambulation with min guard to supervision for safety. Pt reports that her husband will not be available during the daytime and she is concerned about being at home by herself. Pt requesting SNF at d/c to improve independence and level of comfort with maintaining back precautions prior to d/c home. Feel this is appropriate given decreased caregiver support available. Pt will benefit from skilled PT to increase their independence and safety with mobility to allow discharge to the venue listed below.       Follow Up Recommendations SNF;Supervision for mobility/OOB    Equipment Recommendations  None recommended by PT    Recommendations for Other Services       Precautions / Restrictions Precautions Precautions: Fall;Back Precaution Booklet Issued: Yes (comment) Precaution Comments: Reviewed handout and pt was cued for back precautions during functional mobility.  Required Braces or Orthoses: Spinal Brace Spinal Brace: Lumbar corset;Applied in sitting position Restrictions Weight Bearing Restrictions: No      Mobility  Bed Mobility Overal bed mobility: Needs Assistance Bed Mobility: Rolling;Sidelying to Sit Rolling: Supervision Sidelying to sit: Min guard       General bed mobility comments: Increased time. Pt was instructed in log roll technique with rails lowered to simulate home environment.   Transfers Overall transfer level: Needs assistance Equipment used: Rolling walker (2 wheeled) Transfers: Sit to/from Stand Sit to Stand: Min guard          General transfer comment: Pt was cued for hand placement on seated surface for safety. Bed height was elevated to simulate home environment. Pt was not able to achieve sit>stand from low bed height.   Ambulation/Gait Ambulation/Gait assistance: Supervision Ambulation Distance (Feet): 500 Feet Assistive device: Rolling walker (2 wheeled) Gait Pattern/deviations: Step-through pattern;Decreased stride length Gait velocity: Decreased Gait velocity interpretation: Below normal speed for age/gender General Gait Details: Pt ambulating slow but steady with RW for support. No LOB noted.   Stairs            Wheelchair Mobility    Modified Rankin (Stroke Patients Only)       Balance Overall balance assessment: Needs assistance Sitting-balance support: Feet supported;No upper extremity supported Sitting balance-Leahy Scale: Fair     Standing balance support: No upper extremity supported Standing balance-Leahy Scale: Fair Standing balance comment: Static standing at edge of chair prior to initiating stand>sit.                              Pertinent Vitals/Pain Pain Assessment: Faces Faces Pain Scale: Hurts even more Pain Location: Incision Pain Descriptors / Indicators: Operative site guarding;Discomfort Pain Intervention(s): Limited activity within patient's tolerance;Monitored during session;Repositioned    Home Living Family/patient expects to be discharged to:: Private residence Living Arrangements: Spouse/significant other Available Help at Discharge: Family;Available PRN/intermittently Type of Home: House Home Access: Level entry (Steps over a threshold to enter)     Home Layout: One level Home Equipment: Walker - 2 wheels;Bedside commode;Shower seat - built in      Prior Function Level of Independence: Independent  Comments: Occasionally used walker out in the store      Hand Dominance   Dominant Hand: Right    Extremity/Trunk  Assessment   Upper Extremity Assessment: Defer to OT evaluation           Lower Extremity Assessment: Generalized weakness      Cervical / Trunk Assessment: Other exceptions  Communication   Communication: No difficulties  Cognition Arousal/Alertness: Awake/alert Behavior During Therapy: WFL for tasks assessed/performed Overall Cognitive Status: Within Functional Limits for tasks assessed                      General Comments      Exercises        Assessment/Plan    PT Assessment Patient needs continued PT services  PT Diagnosis Difficulty walking;Acute pain   PT Problem List Decreased strength;Decreased range of motion;Decreased activity tolerance;Decreased balance;Decreased mobility;Decreased safety awareness;Decreased knowledge of use of DME;Decreased knowledge of precautions;Pain  PT Treatment Interventions DME instruction;Gait training;Stair training;Functional mobility training;Therapeutic activities;Therapeutic exercise;Neuromuscular re-education;Patient/family education   PT Goals (Current goals can be found in the Care Plan section) Acute Rehab PT Goals Patient Stated Goal: Return home after STR at the SNF level PT Goal Formulation: With patient/family Time For Goal Achievement: 12/14/15 Potential to Achieve Goals: Good    Frequency Min 5X/week   Barriers to discharge Decreased caregiver support Husband still works, and is not home during the day    Co-evaluation               End of Session Equipment Utilized During Treatment: Gait belt;Back brace Activity Tolerance: Patient tolerated treatment well Patient left: in chair;with call bell/phone within reach;with family/visitor present Nurse Communication: Mobility status         Time: [id number] PT Time Calculation (min) (ACUTE ONLY): 26 min   Charges:   PT Evaluation $PT Eval Moderate Complexity: 1 Procedure PT Treatments $Gait Training: 8-22 mins   PT G Codes:         Rolinda Roan 2015/12/12, 9:22 AM   Rolinda Roan, PT, DPT Acute Rehabilitation Services Pager: [phone]

## 2015-12-07 NOTE — Progress Notes (Signed)
Patient ID: Erin Camacho, female   DOB: [birth year], 71 y.o.   MRN: [id number] Signs are stable Motor function appears redoing quite well Patient is ambulating hallways Voiding spontaneously Continue supportive care as doing well for postop day 1 from substantial fusion at L5-S1.

## 2015-12-07 NOTE — Evaluation (Signed)
Occupational Therapy Evaluation Patient Details Name: Erin Camacho MRN: [id number] DOB: 06-Oct-1946 Today's Date: 12/07/2015    History of Present Illness Pt is a 70 y/o female who presents s/p L5-S1 PLIF on 12/06/15.   Clinical Impression   Patient is s/p L5-s1 PLIF surgery resulting in functional limitations due to the deficits listed below (see OT problem list). Pt requesting SNF due to home alone  With concerns for transfers alone, cooking food and inability to dress independently.  Patient will benefit from skilled OT acutely to increase independence and safety with ADLS to allow discharge SNF. Pt requesting [facility] in [place] and SW made aware.     Follow Up Recommendations  SNF ([facility])    Equipment Recommendations  None recommended by OT    Recommendations for Other Services       Precautions / Restrictions Precautions Precautions: Fall;Back Precaution Booklet Issued: Yes (comment) Precaution Comments: handout reviewed for OT and back precautions Required Braces or Orthoses: Spinal Brace Spinal Brace: Lumbar corset;Applied in sitting position Restrictions Weight Bearing Restrictions: No      Mobility Bed Mobility Overal bed mobility: Needs Assistance Bed Mobility: Sit to Supine Rolling: Supervision Sidelying to sit: Min guard   Sit to supine: Min assist   General bed mobility comments: educated on sequence  Transfers Overall transfer level: Needs assistance Equipment used: Rolling walker (2 wheeled) Transfers: Sit to/from Stand Sit to Stand: Min guard         General transfer comment: requires bil UE to push up     Balance Overall balance assessment: Needs assistance Sitting-balance support: Feet supported;No upper extremity supported Sitting balance-Leahy Scale: Fair     Standing balance support: Single extremity supported;During functional activity Standing balance-Leahy Scale: Fair Standing balance comment: Static  standing at edge of chair prior to initiating stand>sit.                             ADL Overall ADL's : Needs assistance/impaired Eating/Feeding: Independent   Grooming: Wash/dry hands;Min guard;Standing               Lower Body Dressing: Moderate assistance;Sit to/from stand Lower Body Dressing Details (indicate cue type and reason): unable to reach bil LE Toilet Transfer: Min guard;Ambulation;RW Toilet Transfer Details (indicate cue type and reason): 3n1 over toilet  Toileting- Clothing Manipulation and Hygiene: Min guard;Sit to/from stand       Functional mobility during ADLs: Min guard General ADL Comments: Pt      Vision     Perception     Praxis      Pertinent Vitals/Pain Pain Assessment: Faces Faces Pain Scale: Hurts little more Pain Location: surgerical site Pain Descriptors / Indicators: Operative site guarding;Discomfort Pain Intervention(s): Repositioned;Premedicated before session;Monitored during session     Hand Dominance Right   Extremity/Trunk Assessment Upper Extremity Assessment Upper Extremity Assessment: Overall WFL for tasks assessed   Lower Extremity Assessment Lower Extremity Assessment: Defer to PT evaluation   Cervical / Trunk Assessment Cervical / Trunk Assessment: Other exceptions Cervical / Trunk Exceptions: previous surg 2014    Communication Communication Communication: No difficulties   Cognition Arousal/Alertness: Awake/alert Behavior During Therapy: WFL for tasks assessed/performed Overall Cognitive Status: Within Functional Limits for tasks assessed                     General Comments       Exercises  Shoulder Instructions      Home Living Family/patient expects to be discharged to:: Private residence Living Arrangements: Spouse/significant other Available Help at Discharge: Family;Available PRN/intermittently Type of Home: House Home Access: Level entry     Home Layout: One  level     Bathroom Shower/Tub: Occupational psychologist: Standard Bathroom Accessibility: Yes   Home Equipment: Environmental consultant - 2 wheels;Bedside commode;Shower seat - built in          Prior Functioning/Environment Level of Independence: Independent        Comments: Occasionally used walker out in the store     OT Diagnosis: Generalized weakness;Acute pain   OT Problem List: Decreased strength;Decreased activity tolerance;Impaired balance (sitting and/or standing);Decreased safety awareness;Decreased knowledge of use of DME or AE;Decreased knowledge of precautions;Pain;Obesity   OT Treatment/Interventions: Self-care/ADL training;Therapeutic exercise;DME and/or AE instruction;Therapeutic activities;Balance training;Patient/family education    OT Goals(Current goals can be found in the care plan section) Acute Rehab OT Goals Patient Stated Goal: to go to SNF OT Goal Formulation: With patient/family Time For Goal Achievement: 12/21/15 Potential to Achieve Goals: Good  OT Frequency: Min 2X/week   Barriers to D/C: Decreased caregiver support  spouse works. will be home alone       Co-evaluation              End of Session Equipment Utilized During Treatment: Gait belt;Rolling walker;Back brace Nurse Communication: Mobility status;Precautions  Activity Tolerance: Patient tolerated treatment well Patient left: in bed;with call bell/phone within reach   Time: 0907-0929 OT Time Calculation (min): 22 min Charges:  OT General Charges $OT Visit: 1 Procedure OT Evaluation $OT Eval Moderate Complexity: 1 Procedure G-Codes:    Parke Poisson B December 20, 2015, 10:47 AM    Jeri Modena   OTR/L PagerIP:[phone] Office: [phone] .

## 2015-12-07 NOTE — NC FL2 (Signed)
[place] MEDICAID FL2 LEVEL OF CARE SCREENING TOOL     IDENTIFICATION  Patient Name: Erin Camacho Birthdate: Dec 29, 1945 Sex: female Admission Date (Current Location): 12/06/2015  [facility] and [place] Number:  Herbalist and Address:  [facility] [place]. [facility], [place] [address], [place], [place] [address]      Provider Number: [id number]  Attending Physician Name and Address:  Kristeen Miss, MD  Relative Name and Phone Number:       Current Level of Care: Hospital Recommended Level of Care: [place] Prior Approval Number:    Date Approved/Denied:   PASRR Number: [id number]  Discharge Plan: SNF    Current Diagnoses: Patient Active Problem List   Diagnosis Date Noted  . Spondylolisthesis at L5-S1 level 12/06/2015  . Preop examination 11/22/2015  . Rhinitis, allergic 11/22/2015  . Suprapubic pressure 08/25/2015  . Elevated blood sugar 11/29/2014  . Diaphoresis 07/27/2014  . Right leg pain 07/27/2014  . 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 Bilateral shoulder pain 02/24/2014  . Skin lesion 01/18/2014  . History of bariatric surgery 01/18/2014  . Major depression ([place]) 05/14/2013  . Status post lumbar spine operation 04/17/2013  . Medicare annual wellness visit, subsequent 01/07/2013  . Hyperlipidemia LDL goal < 100 08/22/2012  . Hypertension 07/07/2012  . Insomnia 07/07/2012  . Obesity (BMI 30-39.9) 07/07/2012    Orientation RESPIRATION BLADDER Height & Weight    Self, Time, Situation, Place  Normal Continent   230 lbs.  BEHAVIORAL SYMPTOMS/MOOD NEUROLOGICAL BOWEL NUTRITION STATUS      Continent Diet  AMBULATORY STATUS COMMUNICATION OF NEEDS Skin   Limited Assist Verbally Surgical wounds                       Personal Care Assistance Level of Assistance  Bathing, Dressing Bathing Assistance: Limited assistance   Dressing Assistance: Limited assistance     Functional  Limitations Info             SPECIAL CARE FACTORS FREQUENCY  PT (By licensed PT), OT (By licensed OT)     PT Frequency: 5/day OT Frequency: 5/day            Contractures      Additional Factors Info  Allergies, Psychotropic   Allergies Info: NKA Psychotropic Info: zoloft         Current Medications (12/07/2015):  This is the current hospital active medication list Current Facility-Administered Medications  Medication Dose Route Frequency Provider Last Rate Last Dose  . 0.9 %  sodium chloride infusion  250 mL Intravenous Continuous Kristeen Miss, MD      . 0.9 %  sodium chloride infusion   Intravenous Continuous Kristeen Miss, MD      . ALPRAZolam Duanne Moron) tablet 0.25 mg  0.25 mg Oral QHS PRN Kristeen Miss, MD      . alum & mag hydroxide-simeth (MAALOX/MYLANTA) 200-200-20 MG/5ML suspension 30 mL  30 mL Oral Q6H PRN Kristeen Miss, MD      . bisacodyl (DULCOLAX) suppository 10 mg  10 mg Rectal Daily PRN Kristeen Miss, MD      . cyclobenzaprine (FLEXERIL) tablet 5 mg  5 mg Oral TID PRN Kristeen Miss, MD      . dexamethasone (DECADRON) tablet 2 mg  2 mg Oral Q12H Kristeen Miss, MD   2 mg at 12/07/15 0933  . docusate sodium (COLACE) capsule 100 mg  100 mg Oral BID Mallie Mussel  Elsner, MD   100 mg at 12/07/15 0933  . fluticasone (FLONASE) 50 MCG/ACT nasal spray 2 spray  2 spray Each Nare Daily Kristeen Miss, MD   2 spray at 12/06/15 2036  . gabapentin (NEURONTIN) capsule 300 mg  300 mg Oral BID Kristeen Miss, MD   300 mg at 12/07/15 0934  . losartan (COZAAR) tablet 50 mg  50 mg Oral Daily Kristeen Miss, MD   50 mg at 12/07/15 0934   And  . hydrochlorothiazide (MICROZIDE) capsule 12.5 mg  12.5 mg Oral Daily Kristeen Miss, MD   12.5 mg at 12/07/15 0934  . HYDROcodone-acetaminophen (NORCO/VICODIN) 5-325 MG per tablet 1-2 tablet  1-2 tablet Oral Q4H PRN Kristeen Miss, MD      . HYDROmorphone (DILAUDID) injection 0.5-1 mg  0.5-1 mg Intravenous Q2H PRN Kristeen Miss, MD      . loratadine (CLARITIN) tablet  10 mg  10 mg Oral Daily Kristeen Miss, MD   10 mg at 12/07/15 0934  . magnesium citrate solution 1 Bottle  1 Bottle Oral Once PRN Kristeen Miss, MD      . menthol-cetylpyridinium (CEPACOL) lozenge 3 mg  1 lozenge Oral PRN Kristeen Miss, MD       Or  . phenol (CHLORASEPTIC) mouth spray 1 spray  1 spray Mouth/Throat PRN Kristeen Miss, MD      . methocarbamol (ROBAXIN) tablet 500 mg  500 mg Oral Q6H PRN Kristeen Miss, MD   500 mg at 12/07/15 0453   Or  . methocarbamol (ROBAXIN) 500 mg in dextrose 5 % 50 mL IVPB  500 mg Intravenous Q6H PRN Kristeen Miss, MD      . metoprolol succinate (TOPROL-XL) 24 hr tablet 25 mg  25 mg Oral Daily Kristeen Miss, MD   25 mg at 12/07/15 0934  . ondansetron (ZOFRAN) injection 4 mg  4 mg Intravenous Q4H PRN Kristeen Miss, MD      . oxyCODONE-acetaminophen (PERCOCET/ROXICET) 5-325 MG per tablet 1-2 tablet  1-2 tablet Oral Q4H PRN Kristeen Miss, MD   1 tablet at 12/07/15 1255  . pantoprazole (PROTONIX) EC tablet 40 mg  40 mg Oral Daily Kristeen Miss, MD      . polyethylene glycol (MIRALAX / GLYCOLAX) packet 17 g  17 g Oral Daily PRN Kristeen Miss, MD      . [facility]) tablet 8.6 mg  1 tablet Oral BID Kristeen Miss, MD   8.6 mg at 12/07/15 0935  . sertraline (ZOLOFT) tablet 50 mg  50 mg Oral Daily Kristeen Miss, MD   50 mg at 12/07/15 0934  . simvastatin (ZOCOR) tablet 5 mg  5 mg Oral QHS Kristeen Miss, MD   5 mg at 12/06/15 2036  . sodium chloride 0.9 % injection 3 mL  3 mL Intravenous Q12H Kristeen Miss, MD   3 mL at 12/07/15 0935  . sodium chloride 0.9 % injection 3 mL  3 mL Intravenous PRN Kristeen Miss, MD      . traMADol Veatrice Bourbon) tablet 50 mg  50 mg Oral Daily Kristeen Miss, MD   50 mg at 12/07/15 0934  . traZODone (DESYREL) tablet 100 mg  100 mg Oral QHS Kristeen Miss, MD   100 mg at 12/06/15 2036     Discharge Medications: Please see discharge summary for a list of discharge medications.  Relevant Imaging Results:  Relevant Lab Results:   Additional  Information SS#: [id number]  Cranford Mon, [place]

## 2015-12-07 NOTE — Clinical Social Work Placement (Signed)
   CLINICAL SOCIAL WORK PLACEMENT  NOTE  Date:  12/07/2015  Patient Details  Name: Erin Camacho MRN: [id number] Date of Birth: 1946-07-22  Clinical Social Work is seeking post-discharge placement for this patient at the [place] level of care (*CSW will initial, date and re-position this form in  chart as items are completed):  Yes   Patient/family provided with [facility] [place] Work Department's list of facilities offering this level of care within the geographic area requested by the patient (or if unable, by the patient's family).  Yes   Patient/family informed of their freedom to choose among providers that offer the needed level of care, that participate in Medicare, Medicaid or managed care program needed by the patient, have an available bed and are willing to accept the patient.  Yes   Patient/family informed of [facility]'s ownership interest in [facility] and [facility], as well as of the fact that they are under no obligation to receive care at these facilities.  PASRR submitted to EDS on       PASRR number received on       Existing PASRR number confirmed on 12/07/15     FL2 transmitted to all facilities in geographic area requested by pt/family on 12/07/15     FL2 transmitted to all facilities within larger geographic area on       Patient informed that his/her managed care company has contracts with or will negotiate with certain facilities, including the following:            Patient/family informed of bed offers received.  Patient chooses bed at       Physician recommends and patient chooses bed at      Patient to be transferred to   on  .  Patient to be transferred to facility by       Patient family notified on   of transfer.  Name of family member notified:        PHYSICIAN Please sign FL2     Additional Comment:    _______________________________________________ Cranford Mon, LCSW 12/07/2015, 3:21 PM

## 2015-12-07 NOTE — Progress Notes (Signed)
[facility] is reviewing for potential admission tomorrow.  [employer] Josem Kaufmann is being started to see if pt will qualify for stay  Unit CSW will continue to follow  Domenica Reamer, [place] Social Worker [phone]

## 2015-12-08 ENCOUNTER — Encounter
Admission: RE | Admit: 2015-12-08 | Discharge: 2015-12-08 | Disposition: A | Discharge status: Home / Self Care | Payer: PPO | Source: Ambulatory Visit | Attending: Internal Medicine | Admitting: Internal Medicine

## 2015-12-08 DIAGNOSIS — I10 Essential (primary) hypertension: Secondary | ICD-10-CM | POA: Diagnosis not present

## 2015-12-08 DIAGNOSIS — M6281 Muscle weakness (generalized): Secondary | ICD-10-CM | POA: Diagnosis not present

## 2015-12-08 DIAGNOSIS — M4727 Other spondylosis with radiculopathy, lumbosacral region: Secondary | ICD-10-CM | POA: Diagnosis not present

## 2015-12-08 DIAGNOSIS — Z981 Arthrodesis status: Secondary | ICD-10-CM | POA: Diagnosis not present

## 2015-12-08 DIAGNOSIS — K219 Gastro-esophageal reflux disease without esophagitis: Secondary | ICD-10-CM | POA: Diagnosis not present

## 2015-12-08 DIAGNOSIS — R2689 Other abnormalities of gait and mobility: Secondary | ICD-10-CM | POA: Diagnosis not present

## 2015-12-08 DIAGNOSIS — M4806 Spinal stenosis, lumbar region: Secondary | ICD-10-CM | POA: Diagnosis not present

## 2015-12-08 DIAGNOSIS — E785 Hyperlipidemia, unspecified: Secondary | ICD-10-CM | POA: Diagnosis not present

## 2015-12-08 DIAGNOSIS — Z4889 Encounter for other specified surgical aftercare: Secondary | ICD-10-CM | POA: Diagnosis not present

## 2015-12-08 DIAGNOSIS — J309 Allergic rhinitis, unspecified: Secondary | ICD-10-CM | POA: Diagnosis not present

## 2015-12-08 DIAGNOSIS — F329 Major depressive disorder, single episode, unspecified: Secondary | ICD-10-CM | POA: Diagnosis not present

## 2015-12-08 MED ORDER — DEXAMETHASONE 1 MG PO TABS: ORAL_TABLET | ORAL | Status: DC

## 2015-12-08 MED ORDER — OXYCODONE-ACETAMINOPHEN 5-325 MG PO TABS: 1.0000 | ORAL_TABLET | ORAL | Status: DC | PRN

## 2015-12-08 MED ORDER — METHOCARBAMOL 500 MG PO TABS: 500.0000 mg | ORAL_TABLET | Freq: Four times a day (QID) | ORAL | Status: DC | PRN

## 2015-12-08 NOTE — Progress Notes (Signed)
Patient ID: Erin Camacho, female   DOB: 1945/12/25, 70 y.o.   MRN: [id number] Vital signs are stable Motor function is intact Dressing is clean and dry Patient would like to go to sniff for several days to recuperate prior to going home as she has a little help at home this is tentatively being arranged possibly today or tomorrow.

## 2015-12-08 NOTE — Progress Notes (Signed)
Report called to [facility] RN about patient discharging to facility for rehab. Waiting for spouse to drive patient to facility.

## 2015-12-08 NOTE — Discharge Instructions (Signed)

## 2015-12-08 NOTE — Clinical Social Work Note (Signed)
Clinical Social Work Assessment  Patient Details  Name: Erin Camacho MRN: [id number] Date of Birth: 01/16/1946  Date of referral:  12/07/15               Reason for consult:  Facility Placement                Permission sought to share information with:  Facility Sport and exercise psychologist, Family Supports Permission granted to share information::  Yes, Verbal Permission Granted  Name::     Armed forces operational officer::  [facility]  Relationship::  Husband  Contact Information:  [phone]  Housing/Transportation Living arrangements for the past 2 months:  [place] of Information:  Patient, Spouse Patient Interpreter Needed:  None Criminal Activity/Legal Involvement Pertinent to Current Situation/Hospitalization:  No - Comment as needed Significant Relationships:  Spouse Lives with:  Spouse Do you feel safe going back to the place where you live?  No Need for family participation in patient care:  Yes (Comment)  Care giving concerns:  CSW received referral for possible SNF placement at time of discharge. CSW met with patient and patient's husband at bedside regarding PT recommendation of SNF placement at time of discharge. Per patient, patient's husband is currently unable to care for patient at their home given patient's current physical needs. Patient and patient's husband expressed understanding of PT recommendation and are agreeable to SNF placement at time of discharge. CSW explained to patient that because the patient walked 500 feet, SNF facilities may be unable to admit patient. Patient expressed understanding. CSW to continue to follow and assist with discharge planning needs.   Social Worker assessment / plan:  CSW spoke with patient and patient's husband concerning possibility of rehab at [facility] before returning home.  Employment status:  Retired Nurse, adult PT Recommendations:  [place] / Referral to  community resources:  [place]  Patient/Family's Response to care:  Patient and patient's husband recognize need for rehab before returning home and are agreeable to a SNF in [place]. Patient reported preference for [facility] in [place].  Patient/Family's Understanding of and Emotional Response to Diagnosis, Current Treatment, and Prognosis:  Patient is realistic regarding therapy needs. No questions/concerns about plan or treatment.    Emotional Assessment Appearance:  Appears stated age Attitude/Demeanor/Rapport:   ([place]) Affect (typically observed):  Accepting, Appropriate Orientation:  Oriented to Self, Oriented to Place, Oriented to  Time, Oriented to Situation Alcohol / Substance use:  Not Applicable Psych involvement (Current and /or in the community):  No (Comment)  Discharge Needs  Concerns to be addressed:  No discharge needs identified, Care Coordination Readmission within the last 30 days:  Yes Current discharge risk:  None Barriers to Discharge:  Continued Medical Work up   [employer], [place] 12/08/2015, 8:10 AM

## 2015-12-08 NOTE — Progress Notes (Signed)
Patient alert and oriented, mae's well, voiding adequate amount of urine, swallowing without difficulty, c/o moderate pain and meds given prior to discharged. Patient discharged to [facility] with spouse. Script and discharged instructions given to patient. Patient and family stated understanding of instructions given.

## 2015-12-08 NOTE — Progress Notes (Signed)
Physical Therapy Treatment Patient Details Name: Erin Camacho MRN: [id number] DOB: Sep 26, 1946 Today's Date: 12/08/2015    History of Present Illness Pt is a 70 y/o female who presents s/p L5-S1 PLIF on 12/06/15.    PT Comments    Pt progressing towards physical therapy goals. Was able to perform transfers and ambulation with supervision for safety. Pt continues to be concerned with returning home alone during the day time. Will continue to follow and progress as able per POC.   Follow Up Recommendations  SNF;Supervision for mobility/OOB     Equipment Recommendations  None recommended by PT    Recommendations for Other Services       Precautions / Restrictions Precautions Precautions: Fall;Back Precaution Booklet Issued: Yes (comment) Precaution Comments: Pt able to recall 3/3 back precautions without cueing. Overall precautions reviewed during functional mobility as well.  Required Braces or Orthoses: Spinal Brace Spinal Brace: Lumbar corset;Applied in sitting position Restrictions Weight Bearing Restrictions: No    Mobility  Bed Mobility Overal bed mobility: Needs Assistance Bed Mobility: Rolling;Sidelying to Sit Rolling: Supervision Sidelying to sit: Supervision       General bed mobility comments: HOB flat but use of rails required. Pt was able to transition to sitting position with increased time.   Transfers Overall transfer level: Needs assistance Equipment used: Rolling walker (2 wheeled) Transfers: Sit to/from Stand Sit to Stand: Min guard         General transfer comment: Bed height in lowest position. Pt was able to power-up to full standing position with BUE's for support. VC's for push from bed instead of walker.   Ambulation/Gait Ambulation/Gait assistance: Supervision Ambulation Distance (Feet): 500 Feet Assistive device: Rolling walker (2 wheeled) Gait Pattern/deviations: Step-through pattern;Decreased stride length;Trunk flexed Gait velocity:  Decreased Gait velocity interpretation: Below normal speed for age/gender General Gait Details: Pt ambulating slow but steady with RW for support. No LOB noted.    Stairs            Wheelchair Mobility    Modified Rankin (Stroke Patients Only)       Balance Overall balance assessment: Needs assistance Sitting-balance support: Feet supported;No upper extremity supported Sitting balance-Leahy Scale: Fair     Standing balance support: No upper extremity supported;During functional activity Standing balance-Leahy Scale: Fair Standing balance comment: Pt stood at sink to wash hands and brush teeth without UE support.                     Cognition Arousal/Alertness: Awake/alert Behavior During Therapy: WFL for tasks assessed/performed Overall Cognitive Status: Within Functional Limits for tasks assessed                      Exercises      General Comments        Pertinent Vitals/Pain Pain Assessment: Faces Faces Pain Scale: Hurts a little bit Pain Location: Incision site Pain Descriptors / Indicators: Operative site guarding Pain Intervention(s): Limited activity within patient's tolerance;Monitored during session;Repositioned    Home Living                      Prior Function            PT Goals (current goals can now be found in the care plan section) Acute Rehab PT Goals Patient Stated Goal: to go to SNF PT Goal Formulation: With patient/family Time For Goal Achievement: 12/14/15 Potential to Achieve Goals: Good Progress towards PT goals: Progressing toward  goals    Frequency  Min 5X/week    PT Plan Current plan remains appropriate    Co-evaluation             End of Session Equipment Utilized During Treatment: Back brace Activity Tolerance: Patient tolerated treatment well Patient left: in chair;with call bell/phone within reach     Time: 0732-0751 PT Time Calculation (min) (ACUTE ONLY): 19 min  Charges:   $Gait Training: 8-22 mins                    G Codes:      Rolinda Roan 12/21/15, 8:01 AM   Rolinda Roan, PT, DPT Acute Rehabilitation Services Pager: [phone]

## 2015-12-08 NOTE — Progress Notes (Signed)
Patient will DC to: [facility] Anticipated DC date: 12/08/15 Family notified: Husband Transport by: Husband will transport her in car  [place] signing off.  Cedric Fishman, [place] Social Worker [phone]

## 2015-12-08 NOTE — Discharge Summary (Signed)
Physician Discharge Summary  Patient ID: Erin Camacho MRN: [id number] DOB/AGE: 71/22/1947 71 y.o.  Admit date: 12/06/2015 Discharge date: 12/08/2015  Admission Diagnoses: Spondylolisthesis L5-S1 with lumbar radiculopathy. Status post arthrodesis L2-L5, lumbar stenosis  Discharge Diagnoses: Spondylolisthesis L5-S1 with lumbar radiculopathy. Status post arthrodesis L2-L5, lumbar stenosis  Active Problems:   Spondylolisthesis at L5-S1 level   Discharged Condition: good  Hospital Course: Patient was omitted to undergo surgical decompression and stabilization at L5-S1. She tolerated this well. Her incision is clean and dry. She is ambulatory. She is discharged to a sniff for further postoperative care.  Consults: None  Significant Diagnostic Studies: None  Treatments: surgery: Decompression L5-S1 with posterior lumbar interbody arthrodesis using peek spacers local autograft and allograft. Pedicle screw fixation from L2-L5 construct to S1 with posterior lateral arthrodesis also.  Discharge Exam: Blood pressure 151/86, pulse 70, temperature 98.7 F (37.1 C), temperature source Oral, resp. rate 16, weight 104.327 kg (230 lb), SpO2 100 %. Incision is clean and dry, motor function is intact. Station and gait are intact.  Disposition: 03-Skilled Nursing Facility  Discharge Instructions    Call MD for:  redness, tenderness, or signs of infection (pain, swelling, redness, odor or green/yellow discharge around incision site)    Complete by:  As directed      Call MD for:  severe uncontrolled pain    Complete by:  As directed      Call MD for:  temperature >100.4    Complete by:  As directed      Diet - low sodium heart healthy    Complete by:  As directed      Discharge instructions    Complete by:  As directed   Okay to shower. Do not apply salves or appointments to incision. No heavy lifting with the upper extremities greater than 15 pounds. May resume driving when not requiring pain  medication and patient feels comfortable with doing so.     Increase activity slowly    Complete by:  As directed             Medication List    TAKE these medications        albuterol (2.5 MG/3ML) 0.083% nebulizer solution  Commonly known as:  PROVENTIL  Take 3 mLs (2.5 mg total) by nebulization every 6 (six) hours as needed for wheezing. Please disp with nebulizer machine     ALPRAZolam 0.25 MG tablet  Commonly known as:  XANAX  Take 1 tablet (0.25 mg total) by mouth at bedtime as needed for sleep.     cyclobenzaprine 5 MG tablet  Commonly known as:  FLEXERIL  Take 1 tablet (5 mg total) by mouth 3 (three) times daily as needed for muscle spasms.     dexamethasone 1 MG tablet  Commonly known as:  DECADRON  2 tablets twice daily for 2 days, one tablet twice daily for 2 days, one tablet daily for 2 days.     fexofenadine 180 MG tablet  Commonly known as:  ALLEGRA  Take 1 tablet (180 mg total) by mouth daily.     fluticasone 50 MCG/ACT nasal spray  Commonly known as:  FLONASE  Place 2 sprays into both nostrils daily.     gabapentin 300 MG capsule  Commonly known as:  NEURONTIN  Take 300 mg by mouth 2 (two) times daily.     losartan-hydrochlorothiazide 50-12.5 MG tablet  Commonly known as:  HYZAAR  Take 1 tablet by mouth daily.  methocarbamol 500 MG tablet  Commonly known as:  ROBAXIN  Take 1 tablet (500 mg total) by mouth every 6 (six) hours as needed for muscle spasms.     metoprolol succinate 25 MG 24 hr tablet  Commonly known as:  TOPROL XL  Take 1 tablet (25 mg total) by mouth daily.     multivitamin tablet  Take 1 tablet by mouth daily.     naproxen sodium 220 MG tablet  Commonly known as:  ANAPROX  Take 440 mg by mouth 2 (two) times daily with a meal.     omeprazole 40 MG capsule  Commonly known as:  PRILOSEC  Take 1 capsule (40 mg total) by mouth daily.     oxyCODONE-acetaminophen 5-325 MG tablet  Commonly known as:  PERCOCET/ROXICET  Take 1-2  tablets by mouth every 4 (four) hours as needed for moderate pain.     RA VITAMIN B-12 TR 1000 MCG Tbcr  Generic drug:  Cyanocobalamin  Take 1,000 mcg by mouth daily.     sertraline 100 MG tablet  Commonly known as:  ZOLOFT  Take 0.5 tablets (50 mg total) by mouth daily.     simvastatin 10 MG tablet  Commonly known as:  ZOCOR  Take 0.5 tablets (5 mg total) by mouth at bedtime.     traMADol 50 MG tablet  Commonly known as:  ULTRAM  Take 1 tablet (50 mg total) by mouth daily.     traZODone 100 MG tablet  Commonly known as:  DESYREL  Take 1 tablet (100 mg total) by mouth at bedtime.     Vitamin D-3 1000 units Caps  Take 1 capsule by mouth daily.         SignedEarleen Newport 12/08/2015, 9:08 AM

## 2015-12-09 DIAGNOSIS — M4800 Spinal stenosis, site unspecified: Secondary | ICD-10-CM | POA: Diagnosis not present

## 2015-12-09 DIAGNOSIS — K5900 Constipation, unspecified: Secondary | ICD-10-CM | POA: Diagnosis not present

## 2015-12-09 DIAGNOSIS — E119 Type 2 diabetes mellitus without complications: Secondary | ICD-10-CM | POA: Diagnosis not present

## 2015-12-19 ENCOUNTER — Encounter: Payer: Self-pay | Admitting: Internal Medicine

## 2015-12-20 ENCOUNTER — Encounter: Payer: Self-pay | Admitting: Internal Medicine

## 2015-12-21 ENCOUNTER — Ambulatory Visit
Admission: EM | Admit: 2015-12-21 | Discharge: 2015-12-21 | Disposition: A | Discharge status: Home / Self Care | Payer: PPO | Attending: Family Medicine | Admitting: Family Medicine

## 2015-12-21 ENCOUNTER — Encounter: Payer: Self-pay | Admitting: Emergency Medicine

## 2015-12-21 DIAGNOSIS — B354 Tinea corporis: Secondary | ICD-10-CM | POA: Diagnosis not present

## 2015-12-21 MED ORDER — FLUCONAZOLE 150 MG PO TABS: ORAL_TABLET | ORAL | Status: DC

## 2015-12-21 NOTE — ED Notes (Signed)
Pt presents with rash to region of incision from back surgery on lower back. Pt had back surgery on 1/17 in [place]. Rash appeared on 1/21, pt states rash itches and burns. Has tried otc benedryl and cortisone cream with some relief. Pt is required to wear a back brace in which she states makes the itching worse.  Rash noted around incision area.

## 2015-12-21 NOTE — ED Provider Notes (Signed)
CSN: [id number]     Arrival date & time 12/21/15  1056 History   First MD Initiated Contact with Patient 12/21/15 1241     Chief Complaint  Patient presents with  . Rash   (Consider location/radiation/quality/duration/timing/severity/associated sxs/prior Treatment) HPI Comments: 70 yo female with approximately 10 days h/o itchy rash to lower back, around and near incision site from recent surgery. States had low back surgery about 2 weeks ago, then went to rehab facility and while there developed the itchy rash. Denies any drainage, pain, tenderness, fevers, chills. States rash has only been severely itchy. Has tried otc cortisone and benadryl without relief.  The history is provided by the patient.    Past Medical History  Diagnosis Date  . Arthritis   . GERD (gastroesophageal reflux disease)   . Allergy     hay fever  . Hypertension   . Hyperlipidemia   . UTI (lower urinary tract infection)   . Spinal stenosis   . Chronic back pain   . Pneumonia   . Diabetes mellitus     no meds      pt states she is not diabetic   Past Surgical History  Procedure Laterality Date  . Cholecystectomy  1995  . Appendectomy    . Abdominal hysterectomy  1973  . Tonsillectomy and adenoidectomy  1979  . Lap band  08/2003  . Hernia repair  08/2006  . 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 Marry Guan  . Septoplasty  04/1978  . Back surgery  04/17/13   Family History  Problem Relation Age of Onset  . Hypertension Mother   . Stroke Mother   . Heart disease Mother   . Hypertension Father   . Stroke Father   . Heart disease Father   . Cancer Other     breast  . Diabetes Other   . Arthritis Sister   . Diabetes Sister   . Dementia Sister   . Breast cancer Sister   . Diabetes Brother   . Diabetes Brother   . Breast cancer Other 3   Social History  Substance Use Topics  . Smoking status: Never Smoker   . Smokeless tobacco: Never Used  . Alcohol Use: Yes     Comment: occ   OB  History    No data available     Review of Systems  Allergies  Review of patient's allergies indicates no known allergies.  Home Medications   Prior to Admission medications   Medication Sig Start Date End Date Taking? Authorizing Provider  albuterol (PROVENTIL) (2.5 MG/3ML) 0.083% nebulizer solution Take 3 mLs (2.5 mg total) by nebulization every 6 (six) hours as needed for wheezing. Please disp with nebulizer machine 03/17/13 11/22/15  Jackolyn Confer, MD  ALPRAZolam Duanne Moron) 0.25 MG tablet Take 1 tablet (0.25 mg total) by mouth at bedtime as needed for sleep. 11/09/14   Jackolyn Confer, MD  Cholecalciferol (VITAMIN D-3) 1000 UNITS CAPS Take 1 capsule by mouth daily.    Historical Provider, MD  Cyanocobalamin (RA VITAMIN B-12 TR) 1000 MCG TBCR Take 1,000 mcg by mouth daily.    Historical Provider, MD  cyclobenzaprine (FLEXERIL) 5 MG tablet Take 1 tablet (5 mg total) by mouth 3 (three) times daily as needed for muscle spasms. 08/27/14   Jackolyn Confer, MD  dexamethasone (DECADRON) 1 MG tablet 2 tablets twice daily for 2 days, one tablet twice daily for 2 days, one tablet daily for 2 days. 12/08/15  Kristeen Miss, MD  fexofenadine (ALLEGRA) 180 MG tablet Take 1 tablet (180 mg total) by mouth daily. 01/18/14   Jackolyn Confer, MD  fluconazole (DIFLUCAN) 150 MG tablet Take 1 tab po once, then repeat 1 tab po in 1 week 12/21/15   Norval Gable, MD  fluticasone [facility]) 50 MCG/ACT nasal spray Place 2 sprays into both nostrils daily. 11/22/15   Jackolyn Confer, MD  gabapentin (NEURONTIN) 300 MG capsule Take 300 mg by mouth 2 (two) times daily.    Historical Provider, MD  losartan-hydrochlorothiazide (HYZAAR) 50-12.5 MG per tablet Take 1 tablet by mouth daily. 05/02/15   Jackolyn Confer, MD  methocarbamol (ROBAXIN) 500 MG tablet Take 1 tablet (500 mg total) by mouth every 6 (six) hours as needed for muscle spasms. 12/08/15   Kristeen Miss, MD  metoprolol succinate (TOPROL XL) 25 MG 24 hr tablet  Take 1 tablet (25 mg total) by mouth daily. 05/02/15   Jackolyn Confer, MD  Multiple Vitamin (MULTIVITAMIN) tablet Take 1 tablet by mouth daily.    Historical Provider, MD  naproxen sodium (ANAPROX) 220 MG tablet Take 440 mg by mouth 2 (two) times daily with a meal.    Historical Provider, MD  omeprazole (PRILOSEC) 40 MG capsule Take 1 capsule (40 mg total) by mouth daily. 11/22/15   Jackolyn Confer, MD  oxyCODONE-acetaminophen (PERCOCET/ROXICET) 5-325 MG tablet Take 1-2 tablets by mouth every 4 (four) hours as needed for moderate pain. 12/08/15   Kristeen Miss, MD  sertraline (ZOLOFT) 100 MG tablet Take 0.5 tablets (50 mg total) by mouth daily. 05/02/15   Jackolyn Confer, MD  simvastatin (ZOCOR) 10 MG tablet Take 0.5 tablets (5 mg total) by mouth at bedtime. 02/08/15   Jackolyn Confer, MD  traMADol (ULTRAM) 50 MG tablet Take 1 tablet (50 mg total) by mouth daily. 11/22/15   Jackolyn Confer, MD  traZODone (DESYREL) 100 MG tablet Take 1 tablet (100 mg total) by mouth at bedtime. 04/04/15   Jackolyn Confer, MD   Meds Ordered and Administered this Visit  Medications - No data to display  BP 151/68 mmHg  Pulse 69  Temp(Src) 99.2 F (37.3 C) (Oral)  Resp 20  Ht 5\' 5"  (1.651 m)  Wt 230 lb (104.327 kg)  BMI 38.27 kg/m2  SpO2 95% No data found.   Physical Exam  Constitutional: She appears well-developed and well-nourished. No distress.  Skin: Rash noted. She is not diaphoretic. There is erythema.  Lower back/lumbar skin with mild diffuse erythema, non-blanchable, non-tender, with surrounding erythematous 2-3 mm satellite lesions; no drainage; well healed scar  Nursing note and vitals reviewed.   ED Course  Procedures (including critical care time)  Labs Review Labs Reviewed - No data to display  Imaging Review No results found.   Visual Acuity Review  Right Eye Distance:   Left Eye Distance:   Bilateral Distance:    Right Eye Near:   Left Eye Near:    Bilateral Near:          MDM   1. Tinea corporis    Discharge Medication List as of 12/21/2015  1:02 PM    START taking these medications   Details  fluconazole (DIFLUCAN) 150 MG tablet Take 1 tab po once, then repeat 1 tab po in 1 week, Normal       1. diagnosis reviewed with patient 2. rx as per orders above; reviewed possible side effects, interactions, risks and benefits  3. Recommend supportive  treatment with otc antifungal cream bid 4. Follow-up prn if symptoms worsen or don't improve    Norval Gable, MD 12/21/15 1319

## 2015-12-22 ENCOUNTER — Encounter: Payer: Self-pay | Admitting: Internal Medicine

## 2015-12-23 ENCOUNTER — Encounter: Payer: Self-pay | Admitting: Internal Medicine

## 2015-12-23 ENCOUNTER — Ambulatory Visit (INDEPENDENT_AMBULATORY_CARE_PROVIDER_SITE_OTHER): Payer: PPO | Admitting: Internal Medicine

## 2015-12-23 VITALS — BP 131/83 | HR 57 | Temp 98.4°F | Ht 65.0 in | Wt 234.0 lb

## 2015-12-23 DIAGNOSIS — L03317 Cellulitis of buttock: Secondary | ICD-10-CM | POA: Insufficient documentation

## 2015-12-23 LAB — CBC WITH DIFFERENTIAL/PLATELET
Basophils Absolute: 0 10*3/uL (ref 0.0–0.1)
Basophils Relative: 0.5 % (ref 0.0–3.0)
Eosinophils Absolute: 0.3 10*3/uL (ref 0.0–0.7)
Eosinophils Relative: 5 % (ref 0.0–5.0)
HCT: 34.9 % — ABNORMAL LOW (ref 36.0–46.0)
Hemoglobin: 11.4 g/dL — ABNORMAL LOW (ref 12.0–15.0)
Lymphocytes Relative: 22.7 % (ref 12.0–46.0)
Lymphs Abs: 1.4 10*3/uL (ref 0.7–4.0)
MCHC: 32.8 g/dL (ref 30.0–36.0)
MCV: 90.4 fl (ref 78.0–100.0)
Monocytes Absolute: 0.5 10*3/uL (ref 0.1–1.0)
Monocytes Relative: 8.2 % (ref 3.0–12.0)
Neutro Abs: 4 10*3/uL (ref 1.4–7.7)
Neutrophils Relative %: 63.6 % (ref 43.0–77.0)
Platelets: 264 10*3/uL (ref 150.0–400.0)
RBC: 3.86 Mil/uL — ABNORMAL LOW (ref 3.87–5.11)
RDW: 14 % (ref 11.5–15.5)
WBC: 6.3 10*3/uL (ref 4.0–10.5)

## 2015-12-23 MED ORDER — DOXYCYCLINE HYCLATE 100 MG PO TABS: 100.0000 mg | ORAL_TABLET | Freq: Two times a day (BID) | ORAL | Status: DC

## 2015-12-23 MED ORDER — HYDROXYZINE HCL 10 MG PO TABS: 10.0000 mg | ORAL_TABLET | Freq: Three times a day (TID) | ORAL | Status: DC | PRN

## 2015-12-23 NOTE — Progress Notes (Signed)
Subjective:    Patient ID: Erin Camacho, female    DOB: [birth year], 70 y.o.   MRN: [id number]  HPI  69YO female presents for acute visit.  Rash -  Rash started in late January. Very itchy. Tried Hydrocortisone with no improvement. Seen at urgent care. Diagnosed as a fungal rash. Treated with Diflucan. No improvement after first dose.  No known fever at home. No rash in other places.  Wt Readings from Last 3 Encounters:  12/23/15 234 lb (106.142 kg)  12/21/15 230 lb (104.327 kg)  12/06/15 230 lb (104.327 kg)   BP Readings from Last 3 Encounters:  12/23/15 131/83  12/21/15 151/68  12/08/15 146/74    Past Medical History  Diagnosis Date  . Arthritis   . GERD (gastroesophageal reflux disease)   . Allergy     hay fever  . Hypertension   . Hyperlipidemia   . UTI (lower urinary tract infection)   . Spinal stenosis   . Chronic back pain   . Pneumonia   . Diabetes mellitus     no meds      pt states she is not diabetic   Family History  Problem Relation Age of Onset  . Hypertension Mother   . Stroke Mother   . Heart disease Mother   . Hypertension Father   . Stroke Father   . Heart disease Father   . Cancer Other     breast  . Diabetes Other   . Arthritis Sister   . Diabetes Sister   . Dementia Sister   . Breast cancer Sister   . Diabetes Brother   . Diabetes Brother   . Breast cancer Other 76   Past Surgical History  Procedure Laterality Date  . Cholecystectomy  1995  . Appendectomy    . Abdominal hysterectomy  1973  . Tonsillectomy and adenoidectomy  1979  . Lap band  08/2003  . Hernia repair  08/2006  . 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 Marry Guan  . Septoplasty  04/1978  . Back surgery  04/17/13   Social History   Social History  . Marital Status: Married    Spouse Name: N/A  . Number of Children: N/A  . Years of Education: N/A   Social History Main Topics  . Smoking status: Never Smoker   . Smokeless tobacco: Never Used  .  Alcohol Use: Yes     Comment: occ  . Drug Use: No  . Sexual Activity: Not Asked   Other Topics Concern  . None   Social History Narrative   Lives in [place] with husband.      Work - retired Sports administrator    Review of Systems  Constitutional: Negative for fever, chills, appetite change, fatigue and unexpected weight change.  Eyes: Negative for visual disturbance.  Respiratory: Negative for shortness of breath.   Cardiovascular: Negative for chest pain and leg swelling.  Gastrointestinal: Negative for abdominal pain.  Musculoskeletal: Positive for myalgias, back pain and arthralgias.  Skin: Positive for color change and rash.  Hematological: Negative for adenopathy. Does not bruise/bleed easily.  Psychiatric/Behavioral: Negative for dysphoric mood. The patient is not nervous/anxious.        Objective:    BP 131/83 mmHg  Pulse 57  Temp(Src) 98.4 F (36.9 C) (Oral)  Ht 5\' 5"  (1.651 m)  Wt 234 lb (106.142 kg)  BMI 38.94 kg/m2  SpO2 98% Physical Exam  Constitutional: She is oriented to person,  place, and time. She appears well-developed and well-nourished. No distress.  HENT:  Head: Normocephalic and atraumatic.  Right Ear: External ear normal.  Left Ear: External ear normal.  Nose: Nose normal.  Mouth/Throat: Oropharynx is clear and moist.  Eyes: Conjunctivae are normal. Pupils are equal, round, and reactive to light. Right eye exhibits no discharge. Left eye exhibits no discharge. No scleral icterus.  Neck: Normal range of motion. Neck supple. No tracheal deviation present. No thyromegaly present.  Pulmonary/Chest: Effort normal. No accessory muscle usage. No tachypnea. She has no decreased breath sounds. She has no rhonchi.  Musculoskeletal: Normal range of motion. She exhibits no edema or tenderness.       Back:  Lymphadenopathy:    She has no cervical adenopathy.  Neurological: She is alert and oriented to person, place, and time. No cranial nerve deficit.  She exhibits normal muscle tone. Coordination normal.  Skin: Skin is warm and dry. No rash noted. She is not diaphoretic. No erythema. No pallor.  Psychiatric: She has a normal mood and affect. Her behavior is normal. Judgment and thought content normal.          Assessment & Plan:   Problem List Items Addressed This Visit      Unprioritized   Cellulitis of buttock - Primary    Erythema at wound site with fine papular rash surrounding incision. This appears to be either infectious or allergic reaction. Her preop PCR screen for MRSA was negative. Will start Doxycycline. CBC today. Blood culture today. Start Atarax for itching. Recheck next week and sooner if symptoms not improving.      Relevant Medications   doxycycline (VIBRA-TABS) 100 MG tablet   Other Relevant Orders   CBC w/Diff   Blood culture (routine single)       Return in about 1 week (around 12/30/2015) for Recheck.

## 2015-12-23 NOTE — Assessment & Plan Note (Signed)
Erythema at wound site with fine papular rash surrounding incision. This appears to be either infectious or allergic reaction. Her preop PCR screen for MRSA was negative. Will start Doxycycline. CBC today. Blood culture today. Start Atarax for itching. Recheck next week and sooner if symptoms not improving.

## 2015-12-23 NOTE — Patient Instructions (Addendum)
Start Doxycycline 100mg  twice daily.  Start Atarax as needed for itching.  Labs today.  Call immediately if any fever, worsening pain or redness.

## 2015-12-23 NOTE — Progress Notes (Signed)
Pre visit review using our clinic review tool, if applicable. No additional management support is needed unless otherwise documented below in the visit note. 

## 2015-12-28 DIAGNOSIS — M4317 Spondylolisthesis, lumbosacral region: Secondary | ICD-10-CM | POA: Diagnosis not present

## 2015-12-29 ENCOUNTER — Encounter: Payer: Self-pay | Admitting: Internal Medicine

## 2015-12-29 ENCOUNTER — Ambulatory Visit (INDEPENDENT_AMBULATORY_CARE_PROVIDER_SITE_OTHER): Payer: PPO | Admitting: Internal Medicine

## 2015-12-29 VITALS — BP 113/64 | HR 54 | Temp 98.0°F | Ht 65.0 in | Wt 232.5 lb

## 2015-12-29 DIAGNOSIS — L03317 Cellulitis of buttock: Secondary | ICD-10-CM | POA: Diagnosis not present

## 2015-12-29 LAB — CULTURE, BLOOD (SINGLE): Organism ID, Bacteria: NO GROWTH

## 2015-12-29 NOTE — Patient Instructions (Signed)
Follow up in 02/2016 or sooner as needed.

## 2015-12-29 NOTE — Progress Notes (Signed)
Pre visit review using our clinic review tool, if applicable. No additional management support is needed unless otherwise documented below in the visit note. 

## 2015-12-29 NOTE — Assessment & Plan Note (Signed)
Rash has completely resolved with treatment with Doxycycline. CBC and blood culture were normal. Will monitor for any recurrent symptoms.

## 2015-12-29 NOTE — Progress Notes (Signed)
Subjective:    Patient ID: Erin Camacho, female    DOB: 12-May-1946, 70 y.o.   MRN: [id number]  HPI  69YO female presents for follow up.  Recently seen for rash at wound site on lower back. CBC sent was normal and Blood Culture was normal. Treated with Doxycycline.  Rash has improved. No fever, chills.  Seen by neurosurgery. Added "anti-inflammatory." Symptoms of pain have been well controlled.    Wt Readings from Last 3 Encounters:  12/29/15 232 lb 8 oz (105.461 kg)  12/23/15 234 lb (106.142 kg)  12/21/15 230 lb (104.327 kg)   BP Readings from Last 3 Encounters:  12/29/15 113/64  12/23/15 131/83  12/21/15 151/68    Past Medical History  Diagnosis Date  . Arthritis   . GERD (gastroesophageal reflux disease)   . Allergy     hay fever  . Hypertension   . Hyperlipidemia   . UTI (lower urinary tract infection)   . Spinal stenosis   . Chronic back pain   . Pneumonia   . Diabetes mellitus     no meds      pt states she is not diabetic   Family History  Problem Relation Age of Onset  . Hypertension Mother   . Stroke Mother   . Heart disease Mother   . Hypertension Father   . Stroke Father   . Heart disease Father   . Cancer Other     breast  . Diabetes Other   . Arthritis Sister   . Diabetes Sister   . Dementia Sister   . Breast cancer Sister   . Diabetes Brother   . Diabetes Brother   . Breast cancer Other 67   Past Surgical History  Procedure Laterality Date  . Cholecystectomy  1995  . Appendectomy    . Abdominal hysterectomy  1973  . Tonsillectomy and adenoidectomy  1979  . Lap band  08/2003  . Hernia repair  08/2006  . 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 Marry Guan  . Septoplasty  04/1978  . Back surgery  04/17/13   Social History   Social History  . Marital Status: Married    Spouse Name: N/A  . Number of Children: N/A  . Years of Education: N/A   Social History Main Topics  . Smoking status: Never Smoker   . Smokeless tobacco:  Never Used  . Alcohol Use: Yes     Comment: occ  . Drug Use: No  . Sexual Activity: Not Asked   Other Topics Concern  . None   Social History Narrative   Lives in [place] with husband.      Work - retired Sports administrator    Review of Systems  Constitutional: Negative for fever, chills, appetite change, fatigue and unexpected weight change.  Eyes: Negative for visual disturbance.  Respiratory: Negative for shortness of breath.   Cardiovascular: Negative for chest pain and leg swelling.  Gastrointestinal: Negative for abdominal pain.  Musculoskeletal: Positive for myalgias, back pain and arthralgias.  Skin: Positive for wound. Negative for color change and rash.  Hematological: Negative for adenopathy. Does not bruise/bleed easily.  Psychiatric/Behavioral: Negative for dysphoric mood. The patient is not nervous/anxious.        Objective:    BP 113/64 mmHg  Pulse 54  Temp(Src) 98 F (36.7 C) (Oral)  Ht 5\' 5"  (1.651 m)  Wt 232 lb 8 oz (105.461 kg)  BMI 38.69 kg/m2  SpO2 97% Physical  Exam  Constitutional: She is oriented to person, place, and time. She appears well-developed and well-nourished. No distress.  HENT:  Head: Normocephalic and atraumatic.  Right Ear: External ear normal.  Left Ear: External ear normal.  Nose: Nose normal.  Mouth/Throat: Oropharynx is clear and moist.  Eyes: Conjunctivae are normal. Pupils are equal, round, and reactive to light. Right eye exhibits no discharge. Left eye exhibits no discharge. No scleral icterus.  Neck: Normal range of motion. Neck supple. No tracheal deviation present. No thyromegaly present.  Cardiovascular: Normal rate, regular rhythm, normal heart sounds and intact distal pulses.  Exam reveals no gallop and no friction rub.   No murmur heard. Pulmonary/Chest: Effort normal and breath sounds normal. No respiratory distress. She has no wheezes. She has no rales. She exhibits no tenderness.  Musculoskeletal: Normal range  of motion. She exhibits no edema or tenderness.       Back:  Lymphadenopathy:    She has no cervical adenopathy.  Neurological: She is alert and oriented to person, place, and time. No cranial nerve deficit. She exhibits normal muscle tone. Coordination normal.  Skin: Skin is warm and dry. No rash noted. She is not diaphoretic. No erythema. No pallor.  Psychiatric: She has a normal mood and affect. Her behavior is normal. Judgment and thought content normal.          Assessment & Plan:   Problem List Items Addressed This Visit      Unprioritized   Cellulitis of buttock - Primary    Rash has completely resolved with treatment with Doxycycline. CBC and blood culture were normal. Will monitor for any recurrent symptoms.          Return in about 8 weeks (around 02/23/2016) for Recheck.

## 2016-01-05 ENCOUNTER — Other Ambulatory Visit: Payer: Self-pay | Admitting: Internal Medicine

## 2016-01-09 ENCOUNTER — Encounter: Payer: Self-pay | Admitting: Internal Medicine

## 2016-01-12 ENCOUNTER — Ambulatory Visit (INDEPENDENT_AMBULATORY_CARE_PROVIDER_SITE_OTHER): Payer: PPO | Admitting: Family Medicine

## 2016-01-12 ENCOUNTER — Encounter: Payer: Self-pay | Admitting: Family Medicine

## 2016-01-12 VITALS — BP 126/78 | HR 60 | Temp 97.8°F | Wt 230.0 lb

## 2016-01-12 DIAGNOSIS — R300 Dysuria: Secondary | ICD-10-CM

## 2016-01-12 DIAGNOSIS — N3000 Acute cystitis without hematuria: Secondary | ICD-10-CM | POA: Diagnosis not present

## 2016-01-12 DIAGNOSIS — N390 Urinary tract infection, site not specified: Secondary | ICD-10-CM | POA: Insufficient documentation

## 2016-01-12 LAB — POCT URINALYSIS DIPSTICK
Bilirubin, UA: NEGATIVE
Blood, UA: NEGATIVE
Glucose, UA: NEGATIVE
Ketones, UA: NEGATIVE
Nitrite, UA: NEGATIVE
Spec Grav, UA: 1.015
Urobilinogen, UA: 2
pH, UA: 8

## 2016-01-12 LAB — URINALYSIS, MICROSCOPIC ONLY

## 2016-01-12 MED ORDER — CEPHALEXIN 500 MG PO CAPS: 500.0000 mg | ORAL_CAPSULE | Freq: Two times a day (BID) | ORAL | Status: DC

## 2016-01-12 MED ORDER — PHENAZOPYRIDINE HCL 95 MG PO TABS: 190.0000 mg | ORAL_TABLET | Freq: Three times a day (TID) | ORAL | Status: DC | PRN

## 2016-01-12 NOTE — Patient Instructions (Signed)
Nice to meet you. You have a UTI. We will treat this with Keflex. We will send a urine culture and microscopy. If you develop worsening stomach discomfort, fevers, diarrhea, nausea, vomiting, blood in her stool, blood in your urine, or any new or changing symptoms please seek medical attention.

## 2016-01-12 NOTE — Progress Notes (Signed)
Pre visit review using our clinic review tool, if applicable. No additional management support is needed unless otherwise documented below in the visit note. 

## 2016-01-12 NOTE — Assessment & Plan Note (Signed)
History and UA consistent with UTI. Mild suprapubic discomfort on palpation, otherwise benign abdominal exam. Vital signs stable. We'll treat with Keflex and Pyridium. We'll send urine for culture and microscopy. Patient will continue to monitor. She's given return precautions.

## 2016-01-12 NOTE — Addendum Note (Signed)
Addended by: Leeanne Rio on: 01/12/2016 11:53 AM   Modules accepted: Miquel Dunn

## 2016-01-12 NOTE — Progress Notes (Signed)
Patient ID: Erin Camacho, female   DOB: Aug 07, 1946, 70 y.o.   MRN: [id number]  Tommi Rumps, MD Phone: [phone]  Erin Camacho is a 70 y.o. female who presents today for same-day visit.  UTI: Patient notes for the last couple of days she's had dysuria and bladder spasms. She notes some urgency. No frequency. No fevers. She notes the bladder spasms occur intermittently. They're in her suprapubic region. No other abdominal discomfort. No vaginal symptoms. She has a history of a hysterectomy, appendectomy, and cholecystectomy. She does have a history of UTI. She does note she had back surgery early in January.  PMH: nonsmoker.   ROS see history of present illness  Objective  Physical Exam Filed Vitals:   01/12/16 1126  BP: 126/78  Pulse: 60  Temp: 97.8 F (36.6 C)    BP Readings from Last 3 Encounters:  01/12/16 126/78  12/29/15 113/64  12/23/15 131/83   Wt Readings from Last 3 Encounters:  01/12/16 230 lb (104.327 kg)  12/29/15 232 lb 8 oz (105.461 kg)  12/23/15 234 lb (106.142 kg)    Physical Exam  Constitutional: She is well-developed, well-nourished, and in no distress.  HENT:  Head: Normocephalic and atraumatic.  Cardiovascular: Normal rate, regular rhythm and normal heart sounds.  Exam reveals no gallop and no friction rub.   No murmur heard. Pulmonary/Chest: Effort normal and breath sounds normal. No respiratory distress. She has no wheezes. She has no rales.  Abdominal: Soft. Bowel sounds are normal. She exhibits no distension and no mass. There is tenderness (minimal suprapubic discomfort on palpation). There is no rebound and no guarding.  Musculoskeletal: She exhibits no edema.  Neurological: She is alert.  Skin: Skin is warm and dry. She is not diaphoretic.     Assessment/Plan: Please see individual problem list.  UTI (urinary tract infection) History and UA consistent with UTI. Mild suprapubic discomfort on palpation, otherwise benign abdominal exam.  Vital signs stable. We'll treat with Keflex and Pyridium. We'll send urine for culture and microscopy. Patient will continue to monitor. She's given return precautions.    Orders Placed This Encounter  Procedures  . Urine Culture  . Urine Microscopic Only  . POCT Urinalysis Dipstick    Meds ordered this encounter  Medications  . cephALEXin (KEFLEX) 500 MG capsule    Sig: Take 1 capsule (500 mg total) by mouth 2 (two) times daily.    Dispense:  14 capsule    Refill:  0  . phenazopyridine (PYRIDIUM) 95 MG tablet    Sig: Take 2 tablets (190 mg total) by mouth 3 (three) times daily as needed for pain.    Dispense:  10 tablet    Refill:  0    Tommi Rumps

## 2016-01-13 LAB — URINE CULTURE
Colony Count: NO GROWTH
Organism ID, Bacteria: NO GROWTH

## 2016-01-16 ENCOUNTER — Telehealth: Payer: Self-pay | Admitting: Internal Medicine

## 2016-01-16 NOTE — Telephone Encounter (Signed)
Pt called about still having pressure when she urinates and still burns some. Pt is still taking the antibiotic she has 5 more pills left. What do you recommend the pt do? Pt was advised by Dr Caryl Bis if she does not get any relief to call. Pt saw Dr Caryl Bis on 01/12/2016. Call pt @ [phone]. Thank you!

## 2016-01-16 NOTE — Telephone Encounter (Signed)
Reason for call: Continuing UTI Symptoms Symptoms: pt states that she is still feeling burning, pressure, spasms when urinating  Duration: 1 week Medications: AZO,cephALEXin (KEFLEX) 500 MG capsule  Last seen for this problem: 01/12/16 Seen by: Dr. Caryl Bis  Pt states that she was told to stop taking the antibiotics, but symptoms are coming back. Please advise, thanks

## 2016-01-16 NOTE — Telephone Encounter (Signed)
She did not have a UTI ,  Which is why the abx were stopped.  She will need to use pyridium , submit another urine for UA and culture,  And if normal will to  make appt for vaginal exam.

## 2016-01-17 ENCOUNTER — Other Ambulatory Visit (INDEPENDENT_AMBULATORY_CARE_PROVIDER_SITE_OTHER): Payer: PPO

## 2016-01-17 DIAGNOSIS — N390 Urinary tract infection, site not specified: Secondary | ICD-10-CM

## 2016-01-17 NOTE — Telephone Encounter (Signed)
Pt coming to submit a urine sample to be tested.

## 2016-01-18 ENCOUNTER — Other Ambulatory Visit: Payer: Self-pay | Admitting: *Deleted

## 2016-01-18 LAB — URINALYSIS, MICROSCOPIC ONLY
Casts: NONE SEEN [LPF]
Yeast: NONE SEEN [HPF]

## 2016-01-18 LAB — URINALYSIS, ROUTINE W REFLEX MICROSCOPIC
Bilirubin Urine: NEGATIVE
Glucose, UA: NEGATIVE
Hgb urine dipstick: NEGATIVE
Nitrite: POSITIVE — AB
Protein, ur: NEGATIVE
Specific Gravity, Urine: 1.024 (ref 1.001–1.035)
pH: 5 (ref 5.0–8.0)

## 2016-01-18 MED ORDER — CIPROFLOXACIN HCL 500 MG PO TABS: 500.0000 mg | ORAL_TABLET | Freq: Two times a day (BID) | ORAL | Status: DC

## 2016-01-20 LAB — URINE CULTURE: Colony Count: 75000

## 2016-01-29 ENCOUNTER — Encounter: Payer: Self-pay | Admitting: Internal Medicine

## 2016-01-30 ENCOUNTER — Encounter: Payer: Self-pay | Admitting: Internal Medicine

## 2016-01-30 ENCOUNTER — Ambulatory Visit (INDEPENDENT_AMBULATORY_CARE_PROVIDER_SITE_OTHER): Payer: PPO | Admitting: Internal Medicine

## 2016-01-30 VITALS — BP 108/71 | HR 95 | Temp 98.2°F | Resp 18 | Ht 65.0 in | Wt 227.6 lb

## 2016-01-30 DIAGNOSIS — N3000 Acute cystitis without hematuria: Secondary | ICD-10-CM | POA: Insufficient documentation

## 2016-01-30 DIAGNOSIS — R300 Dysuria: Secondary | ICD-10-CM | POA: Diagnosis not present

## 2016-01-30 MED ORDER — CIPROFLOXACIN HCL 500 MG PO TABS: 500.0000 mg | ORAL_TABLET | Freq: Two times a day (BID) | ORAL | Status: DC

## 2016-01-30 NOTE — Assessment & Plan Note (Signed)
Symptoms consistent with persistent UTI. Given that she had urinary catheter with recent surgery, and recent urine culture showed Pseudomonas, likely persistent infection. Will resend urine culture. Start Cipro 500mg  po bid x14 days. Follow up prn and in 4 weeks.

## 2016-01-30 NOTE — Patient Instructions (Signed)
Start Cipro for urinary tract infection with Pseudomonas.  Take this twice daily for 2 weeks.  We will send your urine for culture and call with results.

## 2016-01-30 NOTE — Progress Notes (Signed)
Subjective:    Patient ID: Erin Camacho, female    DOB: 05/11/1946, 70 y.o.   MRN: [id number]  HPI  69YO female presents for acute visit.  Dysuria - Persistent burning with urination and pressure. Previous urine culture on 2/28 showed Pseudomonas. She was treated with Cipro for 7 days. Having burning and pressure with urination, which is not as severe but still present. Using Azo with some improvement. No blood in urine. No fever, chills. No flank pain.    Wt Readings from Last 3 Encounters:  01/30/16 227 lb 9.6 oz (103.239 kg)  01/12/16 230 lb (104.327 kg)  12/29/15 232 lb 8 oz (105.461 kg)   BP Readings from Last 3 Encounters:  01/30/16 108/71  01/12/16 126/78  12/29/15 113/64    Past Medical History  Diagnosis Date  . Arthritis   . GERD (gastroesophageal reflux disease)   . Allergy     hay fever  . Hypertension   . Hyperlipidemia   . UTI (lower urinary tract infection)   . Spinal stenosis   . Chronic back pain   . Pneumonia   . Diabetes mellitus     no meds      pt states she is not diabetic   Family History  Problem Relation Age of Onset  . Hypertension Mother   . Stroke Mother   . Heart disease Mother   . Hypertension Father   . Stroke Father   . Heart disease Father   . Cancer Other     breast  . Diabetes Other   . Arthritis Sister   . Diabetes Sister   . Dementia Sister   . Breast cancer Sister   . Diabetes Brother   . Diabetes Brother   . Breast cancer Other 57   Past Surgical History  Procedure Laterality Date  . Cholecystectomy  1995  . Appendectomy    . Abdominal hysterectomy  1973  . Tonsillectomy and adenoidectomy  1979  . Lap band  08/2003  . Hernia repair  08/2006  . 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 Marry Guan  . Septoplasty  04/1978  . Back surgery  04/17/13   Social History   Social History  . Marital Status: Married    Spouse Name: N/A  . Number of Children: N/A  . Years of Education: N/A   Social History  Main Topics  . Smoking status: Never Smoker   . Smokeless tobacco: Never Used  . Alcohol Use: Yes     Comment: occ  . Drug Use: No  . Sexual Activity: Not Asked   Other Topics Concern  . None   Social History Narrative   Lives in [place] with husband.      Work - retired Sports administrator    Review of Systems  Constitutional: Negative for fever, chills and fatigue.  Gastrointestinal: Negative for nausea, vomiting, abdominal pain, diarrhea, constipation and rectal pain.  Genitourinary: Positive for dysuria, urgency, frequency and pelvic pain. Negative for hematuria, flank pain, decreased urine volume, vaginal bleeding, vaginal discharge, difficulty urinating and vaginal pain.       Objective:    BP 108/71 mmHg  Pulse 95  Temp(Src) 98.2 F (36.8 C) (Oral)  Resp 18  Ht 5\' 5"  (1.651 m)  Wt 227 lb 9.6 oz (103.239 kg)  BMI 37.87 kg/m2  SpO2 95% Physical Exam  Constitutional: She is oriented to person, place, and time. She appears well-developed and well-nourished. No distress.  HENT:  Head: Normocephalic and atraumatic.  Right Ear: External ear normal.  Left Ear: External ear normal.  Nose: Nose normal.  Mouth/Throat: Oropharynx is clear and moist.  Eyes: Conjunctivae are normal. Pupils are equal, round, and reactive to light. Right eye exhibits no discharge. Left eye exhibits no discharge. No scleral icterus.  Neck: Normal range of motion. Neck supple. No tracheal deviation present. No thyromegaly present.  Cardiovascular: Normal rate, regular rhythm, normal heart sounds and intact distal pulses.  Exam reveals no gallop and no friction rub.   No murmur heard. Pulmonary/Chest: Effort normal and breath sounds normal. No respiratory distress. She has no wheezes. She has no rales. She exhibits no tenderness.  Abdominal: There is no tenderness.  Musculoskeletal: Normal range of motion. She exhibits no edema or tenderness.  Lymphadenopathy:    She has no cervical adenopathy.    Neurological: She is alert and oriented to person, place, and time. No cranial nerve deficit. She exhibits normal muscle tone. Coordination normal.  Skin: Skin is warm and dry. No rash noted. She is not diaphoretic. No erythema. No pallor.  Psychiatric: She has a normal mood and affect. Her behavior is normal. Judgment and thought content normal.          Assessment & Plan:   Problem List Items Addressed This Visit      Unprioritized   Acute cystitis without hematuria - Primary    Symptoms consistent with persistent UTI. Given that she had urinary catheter with recent surgery, and recent urine culture showed Pseudomonas, likely persistent infection. Will resend urine culture. Start Cipro 500mg  po bid x14 days. Follow up prn and in 4 weeks.      Relevant Medications   ciprofloxacin (CIPRO) 500 MG tablet   Other Relevant Orders   POCT urinalysis dipstick   Urinalysis, Routine w reflex microscopic   Urine culture   CULTURE, URINE COMPREHENSIVE       Return in about 4 weeks (around 02/27/2016) for Recheck.  Ronette Deter, MD Internal Medicine [facility]

## 2016-01-31 LAB — URINALYSIS, ROUTINE W REFLEX MICROSCOPIC
Hgb urine dipstick: NEGATIVE
Ketones, ur: 15 — AB
Nitrite: POSITIVE — AB
Specific Gravity, Urine: >=1.03 — AB
Urine Glucose: NEGATIVE
Urobilinogen, UA: 1
pH: 5 (ref 5.0–8.0)

## 2016-02-04 LAB — URINE CULTURE: Colony Count: 75000

## 2016-02-09 ENCOUNTER — Encounter: Payer: Self-pay | Admitting: Internal Medicine

## 2016-02-20 ENCOUNTER — Encounter: Payer: Self-pay | Admitting: Internal Medicine

## 2016-02-20 ENCOUNTER — Ambulatory Visit (INDEPENDENT_AMBULATORY_CARE_PROVIDER_SITE_OTHER): Payer: PPO | Admitting: Internal Medicine

## 2016-02-20 VITALS — BP 125/72 | HR 59 | Temp 98.3°F | Ht 65.0 in | Wt 228.2 lb

## 2016-02-20 DIAGNOSIS — N390 Urinary tract infection, site not specified: Secondary | ICD-10-CM

## 2016-02-20 LAB — POCT URINALYSIS DIPSTICK
Bilirubin, UA: NEGATIVE
Glucose, UA: NEGATIVE
Ketones, UA: NEGATIVE
Nitrite, UA: NEGATIVE
Protein, UA: NEGATIVE
Spec Grav, UA: 1.02
Urobilinogen, UA: 0.2
pH, UA: 5.5

## 2016-02-20 NOTE — Assessment & Plan Note (Signed)
Recurrent UTI. Recently had pseudomonas UTI after catheterization for procedure, then follow up urine culture showed CONS. UA pos for leuk and trace blood today. Will send for culture. Will set up evaluation with urology. Follow up 4 weeks and prn.

## 2016-02-20 NOTE — Progress Notes (Signed)
Pre visit review using our clinic review tool, if applicable. No additional management support is needed unless otherwise documented below in the visit note. 

## 2016-02-20 NOTE — Addendum Note (Signed)
Addended by: Karlene Einstein D on: 02/20/2016 12:12 PM   Modules accepted: Orders

## 2016-02-20 NOTE — Progress Notes (Signed)
Subjective:    Patient ID: Erin Camacho, female    DOB: February 24, 1946, 70 y.o.   MRN: [id number]  HPI  70YO female presents for follow up.  Recently seen 3/13 for cystitis. Treated with Cipro x 14 days for Pseudomonas in urine culture. Follow up urine culture showed CONS.  Pt emailed 3/23 with some persistent burning with urination.  Symptoms improved, however still very mild burning at end of urination. No fever, chills, flank pain. Not currently taking anything for this.   Wt Readings from Last 3 Encounters:  02/20/16 228 lb 4 oz (103.534 kg)  01/30/16 227 lb 9.6 oz (103.239 kg)  01/12/16 230 lb (104.327 kg)   BP Readings from Last 3 Encounters:  02/20/16 125/72  01/30/16 108/71  01/12/16 126/78    Past Medical History  Diagnosis Date  . Arthritis   . GERD (gastroesophageal reflux disease)   . Allergy     hay fever  . Hypertension   . Hyperlipidemia   . UTI (lower urinary tract infection)   . Spinal stenosis   . Chronic back pain   . Pneumonia   . Diabetes mellitus     no meds      pt states she is not diabetic   Family History  Problem Relation Age of Onset  . Hypertension Mother   . Stroke Mother   . Heart disease Mother   . Hypertension Father   . Stroke Father   . Heart disease Father   . Cancer Other     breast  . Diabetes Other   . Arthritis Sister   . Diabetes Sister   . Dementia Sister   . Breast cancer Sister   . Diabetes Brother   . Diabetes Brother   . Breast cancer Other 64   Past Surgical History  Procedure Laterality Date  . Cholecystectomy  1995  . Appendectomy    . Abdominal hysterectomy  1973  . Tonsillectomy and adenoidectomy  1979  . Lap band  08/2003  . Hernia repair  08/2006  . 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 Marry Guan  . Septoplasty  04/1978  . Back surgery  04/17/13   Social History   Social History  . Marital Status: Married    Spouse Name: N/A  . Number of Children: N/A  . Years of Education: N/A    Social History Main Topics  . Smoking status: Never Smoker   . Smokeless tobacco: Never Used  . Alcohol Use: Yes     Comment: occ  . Drug Use: No  . Sexual Activity: Not Asked   Other Topics Concern  . None   Social History Narrative   Lives in [place] with husband.      Work - retired Sports administrator    Review of Systems  Constitutional: Negative for fever, chills, appetite change, fatigue and unexpected weight change.  Eyes: Negative for visual disturbance.  Respiratory: Negative for shortness of breath.   Cardiovascular: Negative for chest pain and leg swelling.  Gastrointestinal: Negative for nausea, vomiting, abdominal pain, diarrhea and constipation.  Genitourinary: Positive for dysuria. Negative for urgency, flank pain, difficulty urinating, pelvic pain and dyspareunia.  Musculoskeletal: Negative for myalgias and arthralgias.  Skin: Negative for color change and rash.  Hematological: Negative for adenopathy. Does not bruise/bleed easily.  Psychiatric/Behavioral: Negative for sleep disturbance and dysphoric mood. The patient is not nervous/anxious.        Objective:    BP 125/72  mmHg  Pulse 59  Temp(Src) 98.3 F (36.8 C) (Oral)  Ht 5\' 5"  (1.651 m)  Wt 228 lb 4 oz (103.534 kg)  BMI 37.98 kg/m2  SpO2 97% Physical Exam  Constitutional: She is oriented to person, place, and time. She appears well-developed and well-nourished. No distress.  HENT:  Head: Normocephalic and atraumatic.  Right Ear: External ear normal.  Left Ear: External ear normal.  Nose: Nose normal.  Mouth/Throat: Oropharynx is clear and moist.  Eyes: Conjunctivae are normal. Pupils are equal, round, and reactive to light. Right eye exhibits no discharge. Left eye exhibits no discharge. No scleral icterus.  Neck: Normal range of motion. Neck supple. No tracheal deviation present. No thyromegaly present.  Cardiovascular: Normal rate, regular rhythm, normal heart sounds and intact distal  pulses.  Exam reveals no gallop and no friction rub.   No murmur heard. Pulmonary/Chest: Effort normal and breath sounds normal. No respiratory distress. She has no wheezes. She has no rales. She exhibits no tenderness.  Musculoskeletal: Normal range of motion. She exhibits no edema or tenderness.  Lymphadenopathy:    She has no cervical adenopathy.  Neurological: She is alert and oriented to person, place, and time. No cranial nerve deficit. She exhibits normal muscle tone. Coordination normal.  Skin: Skin is warm and dry. No rash noted. She is not diaphoretic. No erythema. No pallor.  Psychiatric: She has a normal mood and affect. Her behavior is normal. Judgment and thought content normal.          Assessment & Plan:  Over 71min of which >50% spent in face-to-face contact with patient discussing plan of care  Problem List Items Addressed This Visit      Unprioritized   Recurrent UTI - Primary    Recurrent UTI. Recently had pseudomonas UTI after catheterization for procedure, then follow up urine culture showed CONS. UA pos for leuk and trace blood today. Will send for culture. Will set up evaluation with urology. Follow up 4 weeks and prn.      Relevant Orders   Ambulatory referral to Urology       Return in about 4 weeks (around 03/19/2016) for Recheck.  Ronette Deter, MD Internal Medicine [facility]

## 2016-02-20 NOTE — Patient Instructions (Signed)
We will set up evaluation with Dr. Erlene Quan.  Follow up 4 weeks.

## 2016-02-21 LAB — URINE CULTURE
Colony Count: NO GROWTH
Organism ID, Bacteria: NO GROWTH

## 2016-03-14 ENCOUNTER — Ambulatory Visit (INDEPENDENT_AMBULATORY_CARE_PROVIDER_SITE_OTHER): Payer: PPO | Admitting: Urology

## 2016-03-14 ENCOUNTER — Encounter: Payer: Self-pay | Admitting: Urology

## 2016-03-14 VITALS — BP 146/77 | HR 59 | Ht 65.0 in | Wt 227.2 lb

## 2016-03-14 DIAGNOSIS — N952 Postmenopausal atrophic vaginitis: Secondary | ICD-10-CM | POA: Diagnosis not present

## 2016-03-14 DIAGNOSIS — R300 Dysuria: Secondary | ICD-10-CM | POA: Diagnosis not present

## 2016-03-14 DIAGNOSIS — N390 Urinary tract infection, site not specified: Secondary | ICD-10-CM | POA: Diagnosis not present

## 2016-03-14 LAB — URINALYSIS, COMPLETE
Bilirubin, UA: NEGATIVE
Glucose, UA: NEGATIVE
Ketones, UA: NEGATIVE
Nitrite, UA: NEGATIVE
Protein, UA: NEGATIVE
Specific Gravity, UA: 1.01 (ref 1.005–1.030)
Urobilinogen, Ur: 0.2 mg/dL (ref 0.2–1.0)
pH, UA: 7 (ref 5.0–7.5)

## 2016-03-14 LAB — MICROSCOPIC EXAMINATION: Bacteria, UA: NONE SEEN

## 2016-03-14 MED ORDER — METHENAMINE HIPPURATE 1 G PO TABS: 1.0000 g | ORAL_TABLET | Freq: Two times a day (BID) | ORAL | Status: DC

## 2016-03-14 MED ORDER — URIBEL 118 MG PO CAPS: 1.0000 | ORAL_CAPSULE | Freq: Four times a day (QID) | ORAL | Status: DC

## 2016-03-14 NOTE — Progress Notes (Signed)
03/14/2016 4:52 PM   Erin Camacho [birth year] [id number]  Referring provider: Jackolyn Confer, MD [id number] [place], [place] [address]  Chief Complaint  Patient presents with  . Recurrent UTI    referred by Dr. Ronette Deter    HPI: Patient is a 70 year old Caucasian female with a history of recurrent urinary tract infections who is referred by her primary care physician, Dr. Ronette Deter, for further evaluation and management.  Patient stated that she first started experiencing symptoms earlier this year. She was having dysuria and bladder pain. She was seen by Dr. Caryl Bis and given Keflex. Urine culture was sent and noted no growth on 01/12/2016. Patient was contacted with these results, but she finished her 7 days of Keflex.  Her symptoms persisted and she was given a prescription for Pyridium. She then was seen by Dr. Gilford Rile and a urine culture from 01/20/2016 was positive for Pseudomonas. She was then placed on Cipro for 7 days.  Symptoms still persisted.  A repeated culture grew out Staphylococcus species on 02/04/2016. She then had a 14 day course of Cipro.  Today, she still experiencing dysuria, and nocturia 3-4.  She states urinating makes the dysuria worse.  She has not had episodes of gross hematuria. She denies fever, chills, nausea and vomiting.    She feels that these events were precipitated after having a Foley catheter placed during a back surgery in January 2017.  She had the Foley catheter in for 3-4 days.  She states that she has not felt right since.  Her UA today is unremarkable.     PMH: Past Medical History  Diagnosis Date  . Arthritis   . GERD (gastroesophageal reflux disease)   . Allergy     hay fever  . Hypertension   . Hyperlipidemia   . UTI (lower urinary tract infection)   . Spinal stenosis   . Chronic back pain   . Pneumonia   . Diabetes mellitus     no meds      pt states she is not diabetic  . Anxiety      Surgical History: Past Surgical History  Procedure Laterality Date  . Cholecystectomy  1995  . Appendectomy    . Abdominal hysterectomy  1973  . Tonsillectomy and adenoidectomy  1979  . Lap band  08/2003  . Hernia repair  08/2006  . 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 Marry Guan  . Septoplasty  04/1978  . Back surgery  04/17/13  . Abdominal hysterectomy      Home Medications:    Medication List       This list is accurate as of: 03/14/16  4:52 PM.  Always use your most recent med list.               albuterol (2.5 MG/3ML) 0.083% nebulizer solution  Commonly known as:  PROVENTIL  Take 3 mLs (2.5 mg total) by nebulization every 6 (six) hours as needed for wheezing. Please disp with nebulizer machine     ALPRAZolam 0.25 MG tablet  Commonly known as:  XANAX  Take 1 tablet (0.25 mg total) by mouth at bedtime as needed for sleep.     ciprofloxacin 500 MG tablet  Commonly known as:  CIPRO  Reported on 03/14/2016     cyclobenzaprine 5 MG tablet  Commonly known as:  FLEXERIL  Take 1 tablet (5 mg total) by mouth 3 (three) times daily as needed for muscle  spasms.     diclofenac 75 MG EC tablet  Commonly known as:  VOLTAREN     fexofenadine 180 MG tablet  Commonly known as:  ALLEGRA  Take 1 tablet (180 mg total) by mouth daily.     fluticasone 50 MCG/ACT nasal spray  Commonly known as:  FLONASE  Place 2 sprays into both nostrils daily.     gabapentin 300 MG capsule  Commonly known as:  NEURONTIN  Take 300 mg by mouth 2 (two) times daily. Reported on 03/14/2016     losartan-hydrochlorothiazide 50-12.5 MG tablet  Commonly known as:  HYZAAR  TAKE ONE (1) TABLET EACH DAY     methenamine 1 g tablet  Commonly known as:  HIPREX  Take 1 tablet (1 g total) by mouth 2 (two) times daily with a meal.     metoprolol succinate 25 MG 24 hr tablet  Commonly known as:  TOPROL-XL  TAKE ONE (1) TABLET EACH DAY     multivitamin tablet  Take 1 tablet by mouth daily.      omeprazole 40 MG capsule  Commonly known as:  PRILOSEC  Take 1 capsule (40 mg total) by mouth daily.     RA VITAMIN B-12 TR 1000 MCG Tbcr  Generic drug:  Cyanocobalamin  Take 1,000 mcg by mouth daily. Reported on 03/14/2016     sertraline 100 MG tablet  Commonly known as:  ZOLOFT  Take 0.5 tablets (50 mg total) by mouth daily.     simvastatin 10 MG tablet  Commonly known as:  ZOCOR  Take 0.5 tablets (5 mg total) by mouth at bedtime.     traMADol 50 MG tablet  Commonly known as:  ULTRAM  Take 1 tablet (50 mg total) by mouth daily.     traZODone 100 MG tablet  Commonly known as:  DESYREL  Take 1 tablet (100 mg total) by mouth at bedtime.     URIBEL 118 MG Caps  Take 1 capsule (118 mg total) by mouth 4 (four) times daily.     Vitamin D-3 1000 units Caps  Take 1 capsule by mouth daily.        Allergies: No Known Allergies  Family History: Family History  Problem Relation Age of Onset  . Hypertension Mother   . Stroke Mother   . Heart disease Mother   . Hypertension Father   . Stroke Father   . Heart disease Father   . Cancer Other     breast  . Diabetes Other   . Arthritis Sister   . Diabetes Sister   . Dementia Sister   . Breast cancer Sister   . Diabetes Brother   . Diabetes Brother   . Breast cancer Other 47  . Kidney disease Neg Hx   . Bladder Cancer Neg Hx     Social History:  reports that she has never smoked. She has never used smokeless tobacco. She reports that she drinks alcohol. She reports that she does not use illicit drugs.  ROS: UROLOGY Frequent Urination?: No Hard to postpone urination?: No Burning/pain with urination?: Yes Get up at night to urinate?: Yes Leakage of urine?: No Urine stream starts and stops?: No Trouble starting stream?: No Do you have to strain to urinate?: No Blood in urine?: Yes Urinary tract infection?: Yes Sexually transmitted disease?: No Injury to kidneys or bladder?: No Painful intercourse?: No Weak  stream?: No Currently pregnant?: No Vaginal bleeding?: No Last menstrual period?: n  Gastrointestinal Nausea?: No Vomiting?:  No Indigestion/heartburn?: Yes Diarrhea?: No Constipation?: Yes  Constitutional Fever: No Night sweats?: No Weight loss?: No Fatigue?: No  Skin Skin rash/lesions?: No Itching?: No  Eyes Blurred vision?: No Double vision?: No  Ears/Nose/Throat Sore throat?: No Sinus problems?: No  Hematologic/Lymphatic Swollen glands?: No Easy bruising?: No  Cardiovascular Leg swelling?: No Chest pain?: No  Respiratory Cough?: No Shortness of breath?: No  Endocrine Excessive thirst?: No  Musculoskeletal Back pain?: Yes Joint pain?: Yes  Neurological Headaches?: No Dizziness?: No  Psychologic Depression?: No Anxiety?: Yes  Physical Exam: BP 146/77 mmHg  Pulse 59  Ht 5\' 5"  (1.651 m)  Wt 227 lb 3.2 oz (103.057 kg)  BMI 37.81 kg/m2  Constitutional: Well nourished. Alert and oriented, No acute distress. HEENT: [place] AT, moist mucus membranes. Trachea midline, no masses. Cardiovascular: No clubbing, cyanosis, or edema. Respiratory: Normal respiratory effort, no increased work of breathing. GI: Abdomen is soft, non tender, non distended, no abdominal masses. Liver and spleen not palpable.  No hernias appreciated.  Stool sample for occult testing is not indicated.   GU: No CVA tenderness.  No bladder fullness or masses.  Atrophic external genitalia, normal pubic hair distribution, no lesions.  Normal urethral meatus, no lesions, no prolapse, no discharge.   Urethral caruncle is noted.   No bladder fullness, tenderness or masses. Normal vagina mucosa, good estrogen effect, no discharge, no lesions, good pelvic support, no cystocele or rectocele noted.  Cervix, uterus and adnexa are surgically absent.    Anus and perineum are without rashes or lesions.    Skin: No rashes, bruises or suspicious lesions. Lymph: No cervical or inguinal  adenopathy. Neurologic: Grossly intact, no focal deficits, moving all 4 extremities. Psychiatric: Normal mood and affect.  Laboratory Data: Lab Results  Component Value Date   WBC 6.3 12/23/2015   HGB 11.4* 12/23/2015   HCT 34.9* 12/23/2015   MCV 90.4 12/23/2015   PLT 264.0 12/23/2015    Lab Results  Component Value Date   CREATININE 0.90 11/22/2015    Lab Results  Component Value Date   HGBA1C 6.2 11/22/2015    Lab Results  Component Value Date   TSH 1.88 02/28/2015       Component Value Date/Time   CHOL 162 11/22/2015 1126   HDL 58.70 11/22/2015 1126   CHOLHDL 3 11/22/2015 1126   VLDL 19.8 11/22/2015 1126   LDLCALC 84 11/22/2015 1126    Lab Results  Component Value Date   AST 21 11/22/2015   Lab Results  Component Value Date   ALT 16 11/22/2015    Urinalysis Results for orders placed or performed in visit on 03/14/16  CULTURE, URINE COMPREHENSIVE  Result Value Ref Range   Urine Culture, Comprehensive Preliminary report    Result 1 Comment   Microscopic Examination  Result Value Ref Range   WBC, UA 6-10 (A) 0 -  5 /hpf   RBC, UA 0-2 0 -  2 /hpf   Epithelial Cells (non renal) 0-10 0 - 10 /hpf   Renal Epithel, UA 0-10 (A) None seen /hpf   Bacteria, UA None seen None seen/Few  Urinalysis, Complete  Result Value Ref Range   Specific Gravity, UA 1.010 1.005 - 1.030   pH, UA 7.0 5.0 - 7.5   Color, UA Yellow Yellow   Appearance Ur Clear Clear   Leukocytes, UA 1+ (A) Negative   Protein, UA Negative Negative/Trace   Glucose, UA Negative Negative   Ketones, UA Negative Negative   RBC, UA  Trace (A) Negative   Bilirubin, UA Negative Negative   Urobilinogen, Ur 0.2 0.2 - 1.0 mg/dL   Nitrite, UA Negative Negative   Microscopic Examination See below:      Assessment & Plan:    1. Recurrent UTI:   Urinalysis today is unremarkable. I will send the urine for culture as patient is still experiencing symptoms.  She'll be contacted with those results.   We'll also obtain a KUB and renal ultrasound to rule out nephrolithiasis or hydronephrosis as a source of her recurrent urinary tract infections.  - Urinalysis, Complete - CULTURE, URINE COMPREHENSIVE  2. Atrophic vaginitis:   Patient was given a sample of vaginal estrogen cream and instructed to apply 0.5mg  (pea-sized amount)  just inside the vaginal introitus with a finger-tip every night for two weeks and then Monday, Wednesday and Friday nights.  I explained to the patient that vaginally administered estrogen, which causes only a slight increase in the blood estrogen levels, have fewer contraindications and adverse systemic effects that oral HT.  She'll be returning in 2 weeks for an exam.  3. Dysuria:   Patient is given samples of Uribel and a coupon for the medication as it is cost prohibitive.  If she finds that the Uribel is too expensive, she may fill the Hiprex prescription. She will be returning in 2 weeks for a follow-up UA and symptom recheck.   Return in about 2 weeks (around 03/28/2016) for exam, KUB and RUS results.  These notes generated with voice recognition software. I apologize for typographical errors.  Zara Council, [facility] [address], [place], [place] [address] [phone]

## 2016-03-14 NOTE — Patient Instructions (Signed)
The Uribel is taken 4 times a day with 8 oz of water.  That has the coupon and it should be 40 capsules for $40 when run as cash.  The Hiprex is twice a day.  Fill this if the Uribel is too expensive.  The estrogen cream is applied with the finger tip to just inside the vagina.  Use about the size of the pea.

## 2016-03-16 ENCOUNTER — Ambulatory Visit: Payer: Self-pay | Admitting: Urology

## 2016-03-18 DIAGNOSIS — N952 Postmenopausal atrophic vaginitis: Secondary | ICD-10-CM | POA: Insufficient documentation

## 2016-03-18 DIAGNOSIS — R300 Dysuria: Secondary | ICD-10-CM | POA: Insufficient documentation

## 2016-03-19 LAB — CULTURE, URINE COMPREHENSIVE

## 2016-03-27 ENCOUNTER — Ambulatory Visit
Admission: RE | Admit: 2016-03-27 | Discharge: 2016-03-27 | Disposition: A | Discharge status: Home / Self Care | Payer: PPO | Source: Ambulatory Visit | Attending: Urology | Admitting: Urology

## 2016-03-27 DIAGNOSIS — N390 Urinary tract infection, site not specified: Secondary | ICD-10-CM

## 2016-03-27 DIAGNOSIS — R143 Flatulence: Secondary | ICD-10-CM | POA: Diagnosis not present

## 2016-03-27 DIAGNOSIS — R109 Unspecified abdominal pain: Secondary | ICD-10-CM | POA: Diagnosis not present

## 2016-03-27 DIAGNOSIS — Z96641 Presence of right artificial hip joint: Secondary | ICD-10-CM | POA: Diagnosis not present

## 2016-03-28 ENCOUNTER — Ambulatory Visit: Payer: PPO | Admitting: Internal Medicine

## 2016-03-28 ENCOUNTER — Encounter: Payer: Self-pay | Admitting: Urology

## 2016-03-28 ENCOUNTER — Ambulatory Visit (INDEPENDENT_AMBULATORY_CARE_PROVIDER_SITE_OTHER): Payer: PPO | Admitting: Urology

## 2016-03-28 VITALS — BP 145/69 | HR 59 | Ht 65.0 in | Wt 226.0 lb

## 2016-03-28 DIAGNOSIS — N952 Postmenopausal atrophic vaginitis: Secondary | ICD-10-CM | POA: Diagnosis not present

## 2016-03-28 DIAGNOSIS — N390 Urinary tract infection, site not specified: Secondary | ICD-10-CM | POA: Diagnosis not present

## 2016-03-28 DIAGNOSIS — R300 Dysuria: Secondary | ICD-10-CM | POA: Diagnosis not present

## 2016-03-28 LAB — URINALYSIS, COMPLETE
Bilirubin, UA: NEGATIVE
Glucose, UA: NEGATIVE
Ketones, UA: NEGATIVE
Nitrite, UA: NEGATIVE
Protein, UA: NEGATIVE
RBC, UA: NEGATIVE
Specific Gravity, UA: 1.02 (ref 1.005–1.030)
Urobilinogen, Ur: 0.2 mg/dL (ref 0.2–1.0)
pH, UA: 7 (ref 5.0–7.5)

## 2016-03-28 LAB — MICROSCOPIC EXAMINATION: RBC, UA: NONE SEEN /HPF

## 2016-03-28 MED ORDER — ESTROGENS, CONJUGATED 0.625 MG/GM VA CREA: 1.0000 | TOPICAL_CREAM | Freq: Every day | VAGINAL | Status: DC

## 2016-03-28 MED ORDER — ESTRADIOL 0.1 MG/GM VA CREA: TOPICAL_CREAM | VAGINAL | Status: DC

## 2016-03-28 MED ORDER — PHENAZOPYRIDINE HCL 200 MG PO TABS: 200.0000 mg | ORAL_TABLET | Freq: Three times a day (TID) | ORAL | Status: DC | PRN

## 2016-03-28 NOTE — Progress Notes (Signed)
11:51 AM   Erin Camacho May 09, 1946 [id number]  Referring provider: Jackolyn Confer, MD [id number] [place], [place] [address]  Chief Complaint  Patient presents with  . Recurrent UTI    2 week follow up     HPI: Patient is a 70 year old Caucasian female with atrophic vaginitis and dysuria who presents today for a 2 week follow-up and to discuss her KUB and renal ultrasound results.  Background history Patient with a history of recurrent urinary tract infections who was referred by her primary care physician, Dr. Ronette Deter, for further evaluation and management.  Patient stated that she first started experiencing symptoms earlier this year. She was having dysuria and bladder pain. She was seen by Dr. Caryl Bis and given Keflex. Urine culture was sent and noted no growth on 01/12/2016. Patient was contacted with these results, but she finished her 7 days of Keflex.  Her symptoms persisted and she was given a prescription for Pyridium. She then was seen by Dr. Gilford Rile and a urine culture from 01/20/2016 was positive for Pseudomonas. She was then placed on Cipro for 7 days.  Symptoms still persisted.  A repeated culture grew out Staphylococcus species on 02/04/2016. She then had a 14 day course of Cipro.   At her last visit, she was found to have atrophic vaginitis and was initiated on vaginal estrogen cream nightly. We also obtained a KUB and a renal ultrasound for further evaluation of her recurrent UTI.  Her KUB taken on 03/27/2016 did not demonstrate any nephrolithiasis. I personally reviewed the films with the patient.  Her renal ultrasound from 03/27/2016 not demonstrate any significant obstruction. She did have a mild prominence of the right renal pelvis which likely reflects an anatomical variant extrarenal pelvis.  I personally reviewed the images with the patient.  She found the Uribel too expensive and the Hiprex ineffective in providing relief of her  dysuria.  Her urine culture from 03/14/2016 was negative for infection.  Today, she is still experiencing dysuria and nocturia 3-4.  She states urinating makes the dysuria worse.  She has not had episodes of gross hematuria. She denies fever, chills, nausea and vomiting.    She did not experience vaginal rash or irritation with the vaginal estrogen cream  She feels that these events were precipitated after having a Foley catheter placed during a back surgery in January 2017.  She had the Foley catheter in for 3-4 days.  She states that she has not felt right since.  Her UA today is unremarkable.     PMH: Past Medical History  Diagnosis Date  . Arthritis   . GERD (gastroesophageal reflux disease)   . Allergy     hay fever  . Hypertension   . Hyperlipidemia   . UTI (lower urinary tract infection)   . Spinal stenosis   . Chronic back pain   . Pneumonia   . Diabetes mellitus     no meds      pt states she is not diabetic  . Anxiety     Surgical History: Past Surgical History  Procedure Laterality Date  . Cholecystectomy  1995  . Appendectomy    . Abdominal hysterectomy  1973  . Tonsillectomy and adenoidectomy  1979  . Lap band  08/2003  . Hernia repair  08/2006  . 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 Marry Guan  . Septoplasty  04/1978  . Back surgery  04/17/13  .  Abdominal hysterectomy      Home Medications:    Medication List       This list is accurate as of: 03/28/16 11:51 AM.  Always use your most recent med list.               albuterol (2.5 MG/3ML) 0.083% nebulizer solution  Commonly known as:  PROVENTIL  Take 3 mLs (2.5 mg total) by nebulization every 6 (six) hours as needed for wheezing. Please disp with nebulizer machine     ALPRAZolam 0.25 MG tablet  Commonly known as:  XANAX  Take 1 tablet (0.25 mg total) by mouth at bedtime as needed for sleep.     cyclobenzaprine 5 MG tablet  Commonly known as:  FLEXERIL  Take 1 tablet (5 mg total) by mouth  3 (three) times daily as needed for muscle spasms.     diclofenac 75 MG EC tablet  Commonly known as:  VOLTAREN     fexofenadine 180 MG tablet  Commonly known as:  ALLEGRA  Take 1 tablet (180 mg total) by mouth daily.     fluticasone 50 MCG/ACT nasal spray  Commonly known as:  FLONASE  Place 2 sprays into both nostrils daily.     losartan-hydrochlorothiazide 50-12.5 MG tablet  Commonly known as:  HYZAAR  TAKE ONE (1) TABLET EACH DAY     methenamine 1 g tablet  Commonly known as:  HIPREX  Take 1 tablet (1 g total) by mouth 2 (two) times daily with a meal.     metoprolol succinate 25 MG 24 hr tablet  Commonly known as:  TOPROL-XL  TAKE ONE (1) TABLET EACH DAY     multivitamin tablet  Take 1 tablet by mouth daily.     omeprazole 40 MG capsule  Commonly known as:  PRILOSEC  Take 1 capsule (40 mg total) by mouth daily.     sertraline 100 MG tablet  Commonly known as:  ZOLOFT  Take 0.5 tablets (50 mg total) by mouth daily.     simvastatin 10 MG tablet  Commonly known as:  ZOCOR  Take 0.5 tablets (5 mg total) by mouth at bedtime.     traMADol 50 MG tablet  Commonly known as:  ULTRAM  Take 1 tablet (50 mg total) by mouth daily.     traZODone 100 MG tablet  Commonly known as:  DESYREL  Take 1 tablet (100 mg total) by mouth at bedtime.     Vitamin D-3 1000 units Caps  Take 1 capsule by mouth daily.        Allergies: No Known Allergies  Family History: Family History  Problem Relation Age of Onset  . Hypertension Mother   . Stroke Mother   . Heart disease Mother   . Hypertension Father   . Stroke Father   . Heart disease Father   . Cancer Other     breast  . Diabetes Other   . Arthritis Sister   . Diabetes Sister   . Dementia Sister   . Breast cancer Sister   . Diabetes Brother   . Diabetes Brother   . Breast cancer Other 47  . Kidney disease Neg Hx   . Bladder Cancer Neg Hx     Social History:  reports that she has never smoked. She has never used  smokeless tobacco. She reports that she drinks alcohol. She reports that she does not use illicit drugs.  ROS: UROLOGY Frequent Urination?: No Hard to postpone urination?: No Burning/pain  with urination?: Yes Get up at night to urinate?: Yes Leakage of urine?: No Urine stream starts and stops?: No Trouble starting stream?: No Do you have to strain to urinate?: No Blood in urine?: No Urinary tract infection?: No Sexually transmitted disease?: No Injury to kidneys or bladder?: No Painful intercourse?: No Weak stream?: No Currently pregnant?: No Vaginal bleeding?: No Last menstrual period?: n  Gastrointestinal Nausea?: No Vomiting?: No Indigestion/heartburn?: Yes Diarrhea?: No Constipation?: No  Constitutional Fever: No Night sweats?: No Weight loss?: No Fatigue?: No  Skin Skin rash/lesions?: No Itching?: No  Eyes Blurred vision?: No Double vision?: No  Ears/Nose/Throat Sore throat?: No Sinus problems?: No  Hematologic/Lymphatic Swollen glands?: No Easy bruising?: No  Cardiovascular Leg swelling?: No Chest pain?: No  Respiratory Cough?: No Shortness of breath?: No  Endocrine Excessive thirst?: No  Musculoskeletal Back pain?: Yes Joint pain?: No  Neurological Headaches?: No Dizziness?: No  Psychologic Depression?: No Anxiety?: No  Physical Exam: BP 145/69 mmHg  Pulse 59  Ht 5\' 5"  (1.651 m)  Wt 226 lb (102.513 kg)  BMI 37.61 kg/m2  Constitutional: Well nourished. Alert and oriented, No acute distress. HEENT: [place] AT, moist mucus membranes. Trachea midline, no masses. Cardiovascular: No clubbing, cyanosis, or edema. Respiratory: Normal respiratory effort, no increased work of breathing. GI: Abdomen is soft, non tender, non distended, no abdominal masses. Liver and spleen not palpable.  No hernias appreciated.  Stool sample for occult testing is not indicated.   GU: No CVA tenderness.  No bladder fullness or masses.  Atrophic external  genitalia, normal pubic hair distribution, no lesions.  Normal urethral meatus, no lesions, no prolapse, no discharge.   Urethral caruncle is noted.   No bladder fullness, tenderness or masses. Normal vagina mucosa, good estrogen effect, no discharge, no lesions, good pelvic support, no cystocele or rectocele noted.  Cervix, uterus and adnexa are surgically absent.    Anus and perineum are without rashes or lesions.    Skin: No rashes, bruises or suspicious lesions. Lymph: No cervical or inguinal adenopathy. Neurologic: Grossly intact, no focal deficits, moving all 4 extremities. Psychiatric: Normal mood and affect.  Laboratory Data: Lab Results  Component Value Date   WBC 6.3 12/23/2015   HGB 11.4* 12/23/2015   HCT 34.9* 12/23/2015   MCV 90.4 12/23/2015   PLT 264.0 12/23/2015    Lab Results  Component Value Date   CREATININE 0.90 11/22/2015    Lab Results  Component Value Date   HGBA1C 6.2 11/22/2015    Lab Results  Component Value Date   TSH 1.88 02/28/2015       Component Value Date/Time   CHOL 162 11/22/2015 1126   HDL 58.70 11/22/2015 1126   CHOLHDL 3 11/22/2015 1126   VLDL 19.8 11/22/2015 1126   LDLCALC 84 11/22/2015 1126    Lab Results  Component Value Date   AST 21 11/22/2015   Lab Results  Component Value Date   ALT 16 11/22/2015    Urinalysis Results for orders placed or performed in visit on 03/28/16  Microscopic Examination  Result Value Ref Range   WBC, UA 0-5 0 -  5 /hpf   RBC, UA None seen 0 -  2 /hpf   Epithelial Cells (non renal) 0-10 0 - 10 /hpf   Renal Epithel, UA 0-10 (A) None seen /hpf   Mucus, UA Present (A) Not Estab.   Bacteria, UA Few (A) None seen/Few  Urinalysis, Complete  Result Value Ref Range   Specific Gravity,  UA 1.020 1.005 - 1.030   pH, UA 7.0 5.0 - 7.5   Color, UA Yellow Yellow   Appearance Ur Clear Clear   Leukocytes, UA 1+ (A) Negative   Protein, UA Negative Negative/Trace   Glucose, UA Negative Negative    Ketones, UA Negative Negative   RBC, UA Negative Negative   Bilirubin, UA Negative Negative   Urobilinogen, Ur 0.2 0.2 - 1.0 mg/dL   Nitrite, UA Negative Negative   Microscopic Examination See below:      Assessment & Plan:    1. Recurrent UTI:   Urinalysis today is unremarkable.  Her urine culture from 03/14/2016 was negative.  KUB and RUS did not identify an etiology for her symptoms.    - Urinalysis, Complete  2. Atrophic vaginitis:   Patient will continue the vaginal estrogen cream, applying it 3 nights weekly.  3. Dysuria:   Recent urine culture is negative.  Patient is still experiencing dysuria.  I would like for her to undergo cystoscopy to evaluate for possible CIS.  I described how this is performed, typically in an office setting with a flexible cystoscope.  I described the risks, benefits, and possible side effects, the most common of which is a minor amount of blood in the urine and/or burning which usually resolves in 24 to 48 hours.     Return for cystoscopy to rule CIS; she prefers to have it with Dr. Erlene Quan.  These notes generated with voice recognition software. I apologize for typographical errors.  Zara Council, [facility] [address], [place], [place] [address] [phone]

## 2016-03-29 ENCOUNTER — Ambulatory Visit (INDEPENDENT_AMBULATORY_CARE_PROVIDER_SITE_OTHER): Payer: PPO | Admitting: Internal Medicine

## 2016-03-29 ENCOUNTER — Encounter: Payer: Self-pay | Admitting: Internal Medicine

## 2016-03-29 VITALS — BP 126/72 | HR 63 | Temp 98.0°F | Ht 65.0 in | Wt 228.6 lb

## 2016-03-29 DIAGNOSIS — M4317 Spondylolisthesis, lumbosacral region: Secondary | ICD-10-CM | POA: Diagnosis not present

## 2016-03-29 DIAGNOSIS — R300 Dysuria: Secondary | ICD-10-CM

## 2016-03-29 DIAGNOSIS — N390 Urinary tract infection, site not specified: Secondary | ICD-10-CM

## 2016-03-29 MED ORDER — TRAMADOL HCL 50 MG PO TABS: 50.0000 mg | ORAL_TABLET | Freq: Three times a day (TID) | ORAL | Status: DC | PRN

## 2016-03-29 MED ORDER — ESTRADIOL 0.1 MG/GM VA CREA: TOPICAL_CREAM | VAGINAL | Status: DC

## 2016-03-29 NOTE — Progress Notes (Signed)
Subjective:    Patient ID: Erin Camacho, female    DOB: 1946/10/10, 70 y.o.   MRN: [id number]  HPI  69YO female presents for follow up of recurrent UTI. Recently evaluated by urology and repeat UA normal. Urine culture pending. Also had pelvic US and xray which were normal. Continues to have burning with urination. Urology started Estrace and Pyridium, however unable to afford Pyridium. Reluctant to start Estrace because of risks. Had a niece who had used Premarin and had breast cancer. Scheduled for cystoscopy in June.  Released by Dr. Ellene Route today. Pain has been well controlled with Tramadol as needed.  Wt Readings from Last 3 Encounters:  03/29/16 228 lb 9.6 oz (103.692 kg)  03/28/16 226 lb (102.513 kg)  03/14/16 227 lb 3.2 oz (103.057 kg)   BP Readings from Last 3 Encounters:  03/29/16 126/72  03/28/16 145/69  03/14/16 146/77    Past Medical History  Diagnosis Date  . Arthritis   . GERD (gastroesophageal reflux disease)   . Allergy     hay fever  . Hypertension   . Hyperlipidemia   . UTI (lower urinary tract infection)   . Spinal stenosis   . Chronic back pain   . Pneumonia   . Diabetes mellitus     no meds      pt states she is not diabetic  . Anxiety    Family History  Problem Relation Age of Onset  . Hypertension Mother   . Stroke Mother   . Heart disease Mother   . Hypertension Father   . Stroke Father   . Heart disease Father   . Cancer Other     breast  . Diabetes Other   . Arthritis Sister   . Diabetes Sister   . Dementia Sister   . Breast cancer Sister   . Diabetes Brother   . Diabetes Brother   . Breast cancer Other 47  . Kidney disease Neg Hx   . Bladder Cancer Neg Hx    Past Surgical History  Procedure Laterality Date  . Cholecystectomy  1995  . Appendectomy    . Abdominal hysterectomy  1973  . Tonsillectomy and adenoidectomy  1979  . Lap band  08/2003  . Hernia repair  08/2006  . 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  Marry Guan  . Septoplasty  04/1978  . Back surgery  04/17/13  . Abdominal hysterectomy     Social History   Social History  . Marital Status: Married    Spouse Name: N/A  . Number of Children: N/A  . Years of Education: N/A   Social History Main Topics  . Smoking status: Never Smoker   . Smokeless tobacco: Never Used  . Alcohol Use: Yes     Comment: occ  . Drug Use: No  . Sexual Activity: Not Asked   Other Topics Concern  . None   Social History Narrative   Lives in [place] with husband.      Work - retired Sports administrator    Review of Systems  Constitutional: Negative for fever, chills, appetite change, fatigue and unexpected weight change.  Eyes: Negative for visual disturbance.  Respiratory: Negative for shortness of breath.   Cardiovascular: Negative for chest pain, palpitations and leg swelling.  Gastrointestinal: Negative for nausea, vomiting, abdominal pain, diarrhea and constipation.  Genitourinary: Positive for dysuria, urgency and frequency. Negative for flank pain, vaginal bleeding, vaginal discharge, vaginal pain and pelvic pain.  Musculoskeletal: Positive for myalgias, back pain (rarely) and arthralgias.  Skin: Negative for color change and rash.  Hematological: Negative for adenopathy. Does not bruise/bleed easily.  Psychiatric/Behavioral: Negative for suicidal ideas, sleep disturbance and dysphoric mood. The patient is not nervous/anxious.        Objective:    BP 126/72 mmHg  Pulse 63  Temp(Src) 98 F (36.7 C) (Oral)  Ht 5\' 5"  (1.651 m)  Wt 228 lb 9.6 oz (103.692 kg)  BMI 38.04 kg/m2  SpO2 97% Physical Exam  Constitutional: She is oriented to person, place, and time. She appears well-developed and well-nourished. No distress.  HENT:  Head: Normocephalic and atraumatic.  Right Ear: External ear normal.  Left Ear: External ear normal.  Nose: Nose normal.  Mouth/Throat: Oropharynx is clear and moist. No oropharyngeal exudate.  Eyes: Conjunctivae  are normal. Pupils are equal, round, and reactive to light. Right eye exhibits no discharge. Left eye exhibits no discharge. No scleral icterus.  Neck: Normal range of motion. Neck supple. No tracheal deviation present. No thyromegaly present.  Cardiovascular: Normal rate, regular rhythm, normal heart sounds and intact distal pulses.  Exam reveals no gallop and no friction rub.   No murmur heard. Pulmonary/Chest: Effort normal and breath sounds normal. No respiratory distress. She has no wheezes. She has no rales. She exhibits no tenderness.  Musculoskeletal: Normal range of motion. She exhibits no edema or tenderness.  Lymphadenopathy:    She has no cervical adenopathy.  Neurological: She is alert and oriented to person, place, and time. No cranial nerve deficit. She exhibits normal muscle tone. Coordination normal.  Skin: Skin is warm and dry. No rash noted. She is not diaphoretic. No erythema. No pallor.  Psychiatric: She has a normal mood and affect. Her behavior is normal. Judgment and thought content normal.          Assessment & Plan:   Problem List Items Addressed This Visit      Unprioritized   Dysuria    Reviewed notes from Urology. Continue Estrace and prn Pyridium. Will follow up after cystoscopy.      Recurrent UTI - Primary    Reviewed notes from Urology. Urine culture pending and cystoscopy pending. Will follow.      Spondylolisthesis at L5-S1 level    Symptomatically, doing well. Will continue prn Tramadol.          Return in about 3 months (around 06/29/2016) for Physical.  Ronette Deter, MD Internal [facility]

## 2016-03-29 NOTE — Patient Instructions (Signed)
Continue current medications.  Follow up in 3 months or sooner as needed. 

## 2016-03-29 NOTE — Assessment & Plan Note (Signed)
Reviewed notes from Urology. Continue Estrace and prn Pyridium. Will follow up after cystoscopy.

## 2016-03-29 NOTE — Progress Notes (Signed)
Pre visit review using our clinic review tool, if applicable. No additional management support is needed unless otherwise documented below in the visit note. 

## 2016-03-29 NOTE — Assessment & Plan Note (Signed)
Symptomatically, doing well. Will continue prn Tramadol.

## 2016-03-29 NOTE — Assessment & Plan Note (Signed)
Reviewed notes from Urology. Urine culture pending and cystoscopy pending. Will follow.

## 2016-04-17 ENCOUNTER — Other Ambulatory Visit: Payer: Self-pay | Admitting: Internal Medicine

## 2016-05-03 ENCOUNTER — Ambulatory Visit (INDEPENDENT_AMBULATORY_CARE_PROVIDER_SITE_OTHER): Payer: PPO | Admitting: Urology

## 2016-05-03 ENCOUNTER — Encounter: Payer: Self-pay | Admitting: Urology

## 2016-05-03 VITALS — BP 154/81 | HR 63 | Ht 65.0 in | Wt 225.0 lb

## 2016-05-03 DIAGNOSIS — N952 Postmenopausal atrophic vaginitis: Secondary | ICD-10-CM | POA: Diagnosis not present

## 2016-05-03 DIAGNOSIS — N390 Urinary tract infection, site not specified: Secondary | ICD-10-CM

## 2016-05-03 DIAGNOSIS — R300 Dysuria: Secondary | ICD-10-CM | POA: Diagnosis not present

## 2016-05-03 DIAGNOSIS — R8299 Other abnormal findings in urine: Secondary | ICD-10-CM | POA: Diagnosis not present

## 2016-05-03 LAB — MICROSCOPIC EXAMINATION
Bacteria, UA: NONE SEEN
Epithelial Cells (non renal): >10 /HPF — AB
RBC, UA: NONE SEEN /HPF

## 2016-05-03 LAB — URINALYSIS, COMPLETE
Bilirubin, UA: NEGATIVE
Glucose, UA: NEGATIVE
Ketones, UA: NEGATIVE
Nitrite, UA: NEGATIVE
Protein, UA: NEGATIVE
RBC, UA: NEGATIVE
Specific Gravity, UA: 1.02 (ref 1.005–1.030)
Urobilinogen, Ur: 0.2 mg/dL (ref 0.2–1.0)
pH, UA: 5.5 (ref 5.0–7.5)

## 2016-05-03 MED ORDER — CIPROFLOXACIN HCL 500 MG PO TABS
500.0000 mg | ORAL_TABLET | Freq: Once | ORAL | Status: AC
  Administered 2016-05-03: 500 mg via ORAL

## 2016-05-03 MED ORDER — LIDOCAINE HCL 2 % EX GEL
1.0000 "application " | Freq: Once | CUTANEOUS | Status: AC
  Administered 2016-05-03: 1 "application " via URETHRAL

## 2016-05-03 NOTE — Progress Notes (Addendum)
05/03/2016  CC: dysuria  HPI:   70 year old female with recurrent episodes of dysuria.  She is grown Pseudomonas and staph species in the past but also had multiple negative cultures. Renal imaging in the form of KUB and ultrasound were negative.  She has not been using estrogen cream as prescribed due to concern for breast cancer. She states that her niece use topical estrogen cream and developed breast cancer as a result. She has no personal history of breast cancer.  Cystoscopy Procedure Note  Patient identification was confirmed, informed consent was obtained, and patient was prepped using Betadine solution.  Lidocaine jelly was administered per urethral meatus.    Preoperative abx where received prior to procedure.    Procedure: - Flexible cystoscope introduced, without any difficulty.   - Thorough search of the bladder revealed:    normal urethral meatus    normal urothelium    no stones    no ulcers     no tumors    no urethral polyps    no trabeculation  - Ureteral orifices were normal in position and appearance.  Post-Procedure: - Patient tolerated the procedure well  Assessment/  Pllan:  1. Recurrent UTI - Urinalysis, Complete - ciprofloxacin (CIPRO) tablet 500 mg; Take 1 tablet (500 mg total) by mouth once. - lidocaine (XYLOCAINE) 2 % jelly 1 application; Place 1 application into the urethra once.  2. Dysuria Likely secondary to atrophic vaginitis, see below. Cystoscopy today not particularly suspicious. Urine cytology sent.  3. Atrophic vaginitis Encourage Estrace usage. Reviewed how to apply this medication. Patient reassured about the very low systemic absorption of estrogen and has no contraindications for this medication including no personal history of breast cancer.  Samples given today.    Return in about 3 months (around 08/03/2016) for [facility]- symptoms recheck.    Hollice Espy, MD   Addendum: Urine cytology negative

## 2016-05-11 ENCOUNTER — Other Ambulatory Visit: Payer: Self-pay | Admitting: Urology

## 2016-05-21 ENCOUNTER — Other Ambulatory Visit: Payer: Self-pay | Admitting: Internal Medicine

## 2016-06-18 ENCOUNTER — Other Ambulatory Visit: Payer: Self-pay | Admitting: Internal Medicine

## 2016-06-29 ENCOUNTER — Encounter: Payer: PPO | Admitting: Internal Medicine

## 2016-07-16 ENCOUNTER — Other Ambulatory Visit: Payer: Self-pay

## 2016-07-16 DIAGNOSIS — E119 Type 2 diabetes mellitus without complications: Secondary | ICD-10-CM | POA: Diagnosis not present

## 2016-07-17 MED ORDER — METOPROLOL SUCCINATE ER 25 MG PO TB24: 25.0000 mg | ORAL_TABLET | Freq: Every day | ORAL | 0 refills | Status: DC

## 2016-07-17 MED ORDER — LOSARTAN POTASSIUM-HCTZ 50-12.5 MG PO TABS: 1.0000 | ORAL_TABLET | Freq: Every day | ORAL | 0 refills | Status: DC

## 2016-07-17 NOTE — Telephone Encounter (Signed)
ok'd refill for losartan/hctz #90 with no refills and metoprolol #90 with no refills.

## 2016-07-24 DIAGNOSIS — H1031 Unspecified acute conjunctivitis, right eye: Secondary | ICD-10-CM | POA: Diagnosis not present

## 2016-08-03 ENCOUNTER — Ambulatory Visit: Payer: PPO | Admitting: Urology

## 2016-08-13 ENCOUNTER — Encounter: Payer: Self-pay | Admitting: Internal Medicine

## 2016-08-13 ENCOUNTER — Ambulatory Visit (INDEPENDENT_AMBULATORY_CARE_PROVIDER_SITE_OTHER): Payer: PPO | Admitting: Internal Medicine

## 2016-08-13 VITALS — BP 118/80 | HR 56 | Temp 98.7°F | Ht 64.0 in | Wt 227.2 lb

## 2016-08-13 DIAGNOSIS — Z9889 Other specified postprocedural states: Secondary | ICD-10-CM | POA: Diagnosis not present

## 2016-08-13 DIAGNOSIS — I10 Essential (primary) hypertension: Secondary | ICD-10-CM

## 2016-08-13 DIAGNOSIS — Z23 Encounter for immunization: Secondary | ICD-10-CM

## 2016-08-13 DIAGNOSIS — Z1239 Encounter for other screening for malignant neoplasm of breast: Secondary | ICD-10-CM | POA: Diagnosis not present

## 2016-08-13 DIAGNOSIS — R739 Hyperglycemia, unspecified: Secondary | ICD-10-CM

## 2016-08-13 DIAGNOSIS — E669 Obesity, unspecified: Secondary | ICD-10-CM | POA: Diagnosis not present

## 2016-08-13 DIAGNOSIS — Z0000 Encounter for general adult medical examination without abnormal findings: Secondary | ICD-10-CM

## 2016-08-13 DIAGNOSIS — Z1159 Encounter for screening for other viral diseases: Secondary | ICD-10-CM

## 2016-08-13 DIAGNOSIS — M4317 Spondylolisthesis, lumbosacral region: Secondary | ICD-10-CM

## 2016-08-13 MED ORDER — HYDROCORTISONE 2.5 % RE CREA: 1.0000 "application " | TOPICAL_CREAM | Freq: Two times a day (BID) | RECTAL | 0 refills | Status: DC

## 2016-08-13 MED ORDER — NYSTATIN 100000 UNIT/GM EX CREA: 1.0000 "application " | TOPICAL_CREAM | Freq: Two times a day (BID) | CUTANEOUS | 0 refills | Status: DC

## 2016-08-13 NOTE — Progress Notes (Signed)
Patient ID: Erin Camacho, female   DOB: 1945-12-13, 70 y.o.   MRN: [id number]   Subjective:    Patient ID: Erin Camacho, female    DOB: [birth year], 70 y.o.   MRN: [id number]  HPI  Patient with past history of hypertension, diabetes and hypercholesterolemia.  She comes in today to follow up on these issues as well as to establish care.  Former pt of Dr Gilford Rile.  Also due a physical.  She is s/p THR 2013.  Had back surgery 11/2015.  Followed by Dr Ellene Route.  Due f/u this week.  Tries to stay active.  No chest pain.  No sob.  No acid reflux.  No abdominal pain or cramping.  Bowels stable.     Past Medical History:  Diagnosis Date  . Allergy    hay fever  . Anxiety   . Arthritis   . Chronic back pain   . Diabetes mellitus    no meds      pt states she is not diabetic  . GERD (gastroesophageal reflux disease)   . Hyperlipidemia   . Hypertension   . Pneumonia   . Spinal stenosis   . UTI (lower urinary tract infection)    Past Surgical History:  Procedure Laterality Date  . ABDOMINAL HYSTERECTOMY  1973  . ABDOMINAL HYSTERECTOMY    . APPENDECTOMY    . BACK SURGERY  04/17/13  . CHOLECYSTECTOMY  1995  . HERNIA REPAIR  08/2006  . lap band  08/2003  . SEPTOPLASTY  04/1978  . TONSILLECTOMY AND ADENOIDECTOMY  1979  . TOTAL HIP ARTHROPLASTY  02/2012   Right, Dr. Marry Guan   Family History  Problem Relation Age of Onset  . Hypertension Mother   . Stroke Mother   . Heart disease Mother   . Hypertension Father   . Stroke Father   . Heart disease Father   . Arthritis Sister   . Diabetes Sister   . Dementia Sister   . Breast cancer Sister   . Diabetes Brother   . Diabetes Brother   . Breast cancer Other 47  . Cancer Other     breast  . Diabetes Other   . Kidney disease Neg Hx   . Bladder Cancer Neg Hx    Social History   Social History  . Marital status: Married    Spouse name: N/A  . Number of children: N/A  . Years of education: N/A   Social History Main Topics  . Smoking  status: Never Smoker  . Smokeless tobacco: Never Used  . Alcohol use Yes     Comment: occ  . Drug use: No  . Sexual activity: Not Asked   Other Topics Concern  . None   Social History Narrative   Lives in [place] with husband.      Work - retired Psychologist, clinical Prescriptions as of 08/13/2016  Medication Sig  . Cholecalciferol (VITAMIN D-3) 1000 UNITS CAPS Take 1 capsule by mouth daily.  . cyclobenzaprine (FLEXERIL) 5 MG tablet Take 1 tablet (5 mg total) by mouth 3 (three) times daily as needed for muscle spasms.  . diclofenac (VOLTAREN) 75 MG EC tablet   . estradiol (ESTRACE VAGINAL) 0.1 MG/GM vaginal cream Apply 0.'5mg'$  (pea-sized amount)  just inside the vaginal introitus with a finger-tip every night for two weeks and then Monday, Wednesday and Friday nights.  . fexofenadine (ALLEGRA) 180 MG tablet Take 1 tablet (180 mg total) by mouth  daily.  . fluticasone (FLONASE) 50 MCG/ACT nasal spray Place 2 sprays into both nostrils daily.  Marland Kitchen losartan-hydrochlorothiazide (HYZAAR) 50-12.5 MG tablet Take 1 tablet by mouth daily.  . metoprolol succinate (TOPROL-XL) 25 MG 24 hr tablet Take 1 tablet (25 mg total) by mouth daily.  . Multiple Vitamin (MULTIVITAMIN) tablet Take 1 tablet by mouth daily.  Marland Kitchen omeprazole (PRILOSEC) 40 MG capsule Take 1 capsule (40 mg total) by mouth daily.  . sertraline (ZOLOFT) 100 MG tablet TAKE ONE-HALF TABLET DAILY  . simvastatin (ZOCOR) 10 MG tablet TAKE ONE-HALF TABLET (5 MG TOTAL) BY MOUTH AT BEDTIME.  . traMADol (ULTRAM) 50 MG tablet Take 1 tablet (50 mg total) by mouth 3 (three) times daily as needed.  . traZODone (DESYREL) 100 MG tablet TAKE ONE (1) TABLET AT BEDTIME  . albuterol (PROVENTIL) (2.5 MG/3ML) 0.083% nebulizer solution Take 3 mLs (2.5 mg total) by nebulization every 6 (six) hours as needed for wheezing. Please disp with nebulizer machine  . hydrocortisone (PROCTOZONE-HC) 2.5 % rectal cream Place 1 application rectally 2  (two) times daily.  Marland Kitchen nystatin cream (MYCOSTATIN) Apply 1 application topically 2 (two) times daily.   No facility-administered encounter medications on file as of 08/13/2016.     Review of Systems  Constitutional: Negative for appetite change and unexpected weight change.  HENT: Negative for congestion and sinus pressure.   Eyes: Negative for pain and visual disturbance.  Respiratory: Negative for cough, chest tightness and shortness of breath.   Cardiovascular: Negative for chest pain, palpitations and leg swelling.  Gastrointestinal: Negative for abdominal pain, diarrhea, nausea and vomiting.  Genitourinary: Negative for difficulty urinating and dysuria.  Musculoskeletal: Negative for joint swelling.       S/p back surgery  11/2015.    Skin: Negative for color change and rash.  Neurological: Negative for dizziness, light-headedness and headaches.  Hematological: Negative for adenopathy. Does not bruise/bleed easily.  Psychiatric/Behavioral: Negative for agitation and dysphoric mood.       Objective:    Physical Exam  Constitutional: She is oriented to person, place, and time. She appears well-developed and well-nourished. No distress.  HENT:  Nose: Nose normal.  Mouth/Throat: Oropharynx is clear and moist.  Eyes: Right eye exhibits no discharge. Left eye exhibits no discharge. No scleral icterus.  Neck: Neck supple. No thyromegaly present.  Cardiovascular: Normal rate and regular rhythm.   Pulmonary/Chest: Breath sounds normal. No accessory muscle usage. No tachypnea. No respiratory distress. She has no decreased breath sounds. She has no wheezes. She has no rhonchi. Right breast exhibits no inverted nipple, no mass, no nipple discharge and no tenderness (no axillary adenopathy). Left breast exhibits no inverted nipple, no mass, no nipple discharge and no tenderness (no axilarry adenopathy).  Abdominal: Soft. Bowel sounds are normal. There is no tenderness.  Musculoskeletal: She  exhibits no edema or tenderness.  Lymphadenopathy:    She has no cervical adenopathy.  Neurological: She is alert and oriented to person, place, and time.  Skin: Skin is warm. No rash noted. No erythema.  Psychiatric: She has a normal mood and affect. Her behavior is normal.    BP 118/80   Pulse (!) 56   Temp 98.7 F (37.1 C) (Oral)   Ht _0  (1.626 m)   Wt 227 lb 3.2 oz (103.1 kg)   SpO2 96%   BMI 39.00 kg/m  Wt Readings from Last 3 Encounters:  08/13/16 227 lb 3.2 oz (103.1 kg)  05/03/16 225 lb (102.1 kg)  03/29/16  228 lb 9.6 oz (103.7 kg)     Lab Results  Component Value Date   WBC 6.3 12/23/2015   HGB 11.4 (L) 12/23/2015   HCT 34.9 (L) 12/23/2015   PLT 264.0 12/23/2015   GLUCOSE 142 (H) 11/22/2015   CHOL 162 11/22/2015   TRIG 99.0 11/22/2015   HDL 58.70 11/22/2015   LDLDIRECT 146.5 09/19/2012   LDLCALC 84 11/22/2015   ALT 16 11/22/2015   AST 21 11/22/2015   NA 140 11/22/2015   K 4.0 11/22/2015   CL 103 11/22/2015   CREATININE 0.90 11/22/2015   BUN 24 (H) 11/22/2015   CO2 30 11/22/2015   TSH 1.88 02/28/2015   INR 1.0 11/22/2015   HGBA1C 6.2 11/22/2015   MICROALBUR 0.6 02/28/2015    Dg Abd 1 View  Result Date: 03/27/2016 CLINICAL DATA:  Flank pain.  Recurrent UTIs. EXAM: ABDOMEN - 1 VIEW COMPARISON:  12/28/2015 FINDINGS: Examination demonstrates lap band apparatus over the left upper quadrant. Surgical clips over the right upper quadrant there is mild fecal retention within the colon without evidence of obstruction. No focal calcifications projected over the kidneys. Several pelvic calcifications likely phleboliths. Stable fusion hardware over the lumbosacral spine. Right total hip arthroplasty is present. Mild degenerate change of the left hip and spine. IMPRESSION: Nonobstructive bowel gas pattern. Electronically Signed   By: Marin Olp M.D.   On: 03/27/2016 13:55   US Renal  Result Date: 03/27/2016 CLINICAL DATA:  Recurrent urinary tract infections and  dysuria since back surgery in 2017 EXAM: RENAL / URINARY TRACT ULTRASOUND COMPLETE COMPARISON:  None in PACs FINDINGS: Right Kidney: Length: 11.1 cm. There is mild prominence of the renal pelvis. No caliectasis is observed. The echotexture of the renal cortex is normal. No cystic or solid mass is observed. No perinephric fluid collections are demonstrated. No shadowing stones are observed. Left Kidney: Length: 11.0 cm. Echogenicity within normal limits. No mass or hydronephrosis visualized. No stones or perinephric fluid collections are observed. Bladder: The urinary bladder is partially distended. Bilateral ureteral jets are observed. IMPRESSION: No evidence of significant obstruction. Mild prominence of the right renal pelvis likely reflects an anatomic variant extrarenal pelvis. No perinephric fluid collections are observed. The urinary bladder does not appear abnormally distended. Electronically Signed   By: David  Martinique M.D.   On: 03/27/2016 13:38       Assessment & Plan:   Problem List Items Addressed This Visit    Elevated blood sugar    Follow met b and a1c.  Low carb diet and exercise.        Relevant Orders   Hemoglobin A1c   Healthcare maintenance    Physical today 08/13/16.  Mammogram 09/23/15 - Birads I.  Colonoscopy 07/08/07.        Hypertension    Blood pressure under good control.  Continue same medication regimen.  Follow pressures.  Follow metabolic panel.        Relevant Orders   TSH   CBC with Differential/Platelet   Lipid panel   Hepatic function panel   Basic metabolic panel   Obesity (BMI 30-39.9)    Diet and exercise.        Spondylolisthesis at L5-S1 level    S/p surgery.  Continue to f/u with Dr Ellene Route.        Status post lumbar spine operation    Continue to follow up with Dr Ellene Route.         Other Visit Diagnoses    Screening  breast examination    -  Primary   Relevant Orders   MM Digital Screening   Encounter for immunization       Relevant  Orders   MM Digital Screening   Flu vaccine HIGH DOSE PF (Completed)   TSH   CBC with Differential/Platelet   Lipid panel   Hepatic function panel   Hemoglobin M3O   Basic metabolic panel   Need for hepatitis C screening test       Relevant Orders   Hepatitis C antibody       [name], MD

## 2016-08-13 NOTE — Progress Notes (Signed)
Pre visit review using our clinic review tool, if applicable. No additional management support is needed unless otherwise documented below in the visit note. 

## 2016-08-15 ENCOUNTER — Telehealth: Payer: Self-pay | Admitting: Internal Medicine

## 2016-08-15 MED ORDER — ALPRAZOLAM 0.25 MG PO TABS: 0.2500 mg | ORAL_TABLET | Freq: Every day | ORAL | 0 refills | Status: DC | PRN

## 2016-08-15 NOTE — Telephone Encounter (Signed)
Pt called needing a Rx for Xanax. Pt states she was just in to see Dr Nicki Reaper. Please advise?  Pharmacy is [facility], [place]  Call pt @ [phone]. Thank you!

## 2016-08-15 NOTE — Telephone Encounter (Signed)
rx ok'd for xanax #30 with no refills.  rx signed and placed on your desk.

## 2016-08-15 NOTE — Telephone Encounter (Signed)
faxed

## 2016-08-15 NOTE — Telephone Encounter (Signed)
Refilled 10/2014 and expired. Prescribed by Dr.Walker. Pt takes as needed. She likes to have on hand when she gets nervous and to help with sleep when she has leg. She takes 0.25.   Pharmacy confirmed it was refilled 11/09/14.

## 2016-08-15 NOTE — Telephone Encounter (Signed)
This is not on pt medication list and on her last OV 08/13/16 this was not prescribed nor talked about.

## 2016-08-15 NOTE — Telephone Encounter (Signed)
Need to clarify how often she takes this medication.  Per medication history rx was given by Dr Gilford Rile in 04/2016.  Also check with pharmacy, when last refilled.  Thanks

## 2016-08-16 DIAGNOSIS — M5416 Radiculopathy, lumbar region: Secondary | ICD-10-CM | POA: Diagnosis not present

## 2016-08-19 ENCOUNTER — Encounter: Payer: Self-pay | Admitting: Internal Medicine

## 2016-08-19 NOTE — Assessment & Plan Note (Signed)
Diet and exercise.   

## 2016-08-19 NOTE — Assessment & Plan Note (Signed)
Follow met b and a1c.  Low carb diet and exercise.

## 2016-08-19 NOTE — Assessment & Plan Note (Signed)
Continue to follow up with Dr Ellene Route.

## 2016-08-19 NOTE — Assessment & Plan Note (Signed)
Blood pressure under good control.  Continue same medication regimen.  Follow pressures.  Follow metabolic panel.   

## 2016-08-19 NOTE — Assessment & Plan Note (Signed)
Physical today 08/13/16.  Mammogram 09/23/15 - Birads I.  Colonoscopy 07/08/07.

## 2016-08-19 NOTE — Assessment & Plan Note (Signed)
S/p surgery.  Continue to f/u with Dr Ellene Route.

## 2016-08-21 ENCOUNTER — Other Ambulatory Visit (INDEPENDENT_AMBULATORY_CARE_PROVIDER_SITE_OTHER): Payer: PPO

## 2016-08-21 DIAGNOSIS — R739 Hyperglycemia, unspecified: Secondary | ICD-10-CM | POA: Diagnosis not present

## 2016-08-21 DIAGNOSIS — I10 Essential (primary) hypertension: Secondary | ICD-10-CM

## 2016-08-21 DIAGNOSIS — Z1159 Encounter for screening for other viral diseases: Secondary | ICD-10-CM | POA: Diagnosis not present

## 2016-08-21 DIAGNOSIS — Z23 Encounter for immunization: Secondary | ICD-10-CM | POA: Diagnosis not present

## 2016-08-21 LAB — BASIC METABOLIC PANEL WITH GFR
BUN: 18 mg/dL (ref 6–23)
CO2: 32 meq/L (ref 19–32)
Calcium: 10.1 mg/dL (ref 8.4–10.5)
Chloride: 104 meq/L (ref 96–112)
Creatinine, Ser: 0.87 mg/dL (ref 0.40–1.20)
GFR: 68.16 mL/min
Glucose, Bld: 113 mg/dL — ABNORMAL HIGH (ref 70–99)
Potassium: 4.1 meq/L (ref 3.5–5.1)
Sodium: 143 meq/L (ref 135–145)

## 2016-08-21 LAB — CBC WITH DIFFERENTIAL/PLATELET
Basophils Absolute: 0 10*3/uL (ref 0.0–0.1)
Basophils Relative: 0.6 % (ref 0.0–3.0)
Eosinophils Absolute: 0.3 10*3/uL (ref 0.0–0.7)
Eosinophils Relative: 5.4 % — ABNORMAL HIGH (ref 0.0–5.0)
HCT: 40.4 % (ref 36.0–46.0)
Hemoglobin: 13.6 g/dL (ref 12.0–15.0)
Lymphocytes Relative: 29.6 % (ref 12.0–46.0)
Lymphs Abs: 1.7 10*3/uL (ref 0.7–4.0)
MCHC: 33.7 g/dL (ref 30.0–36.0)
MCV: 90.5 fl (ref 78.0–100.0)
Monocytes Absolute: 0.5 10*3/uL (ref 0.1–1.0)
Monocytes Relative: 8.7 % (ref 3.0–12.0)
Neutro Abs: 3.2 10*3/uL (ref 1.4–7.7)
Neutrophils Relative %: 55.7 % (ref 43.0–77.0)
Platelets: 210 10*3/uL (ref 150.0–400.0)
RBC: 4.46 Mil/uL (ref 3.87–5.11)
RDW: 14.3 % (ref 11.5–15.5)
WBC: 5.8 10*3/uL (ref 4.0–10.5)

## 2016-08-21 LAB — LIPID PANEL
Cholesterol: 175 mg/dL (ref 0–200)
HDL: 59.8 mg/dL
LDL Cholesterol: 92 mg/dL (ref 0–99)
NonHDL: 115.34
Total CHOL/HDL Ratio: 3
Triglycerides: 118 mg/dL (ref 0.0–149.0)
VLDL: 23.6 mg/dL (ref 0.0–40.0)

## 2016-08-21 LAB — HEMOGLOBIN A1C: Hgb A1c MFr Bld: 6.1 % (ref 4.6–6.5)

## 2016-08-21 LAB — HEPATIC FUNCTION PANEL
ALT: 15 U/L (ref 0–35)
AST: 19 U/L (ref 0–37)
Albumin: 4.2 g/dL (ref 3.5–5.2)
Alkaline Phosphatase: 78 U/L (ref 39–117)
Bilirubin, Direct: 0.1 mg/dL (ref 0.0–0.3)
Total Bilirubin: 0.7 mg/dL (ref 0.2–1.2)
Total Protein: 7.2 g/dL (ref 6.0–8.3)

## 2016-08-21 LAB — TSH: TSH: 2.66 u[IU]/mL (ref 0.35–4.50)

## 2016-08-22 ENCOUNTER — Encounter: Payer: Self-pay | Admitting: Internal Medicine

## 2016-08-22 LAB — HEPATITIS C ANTIBODY: HCV Ab: NEGATIVE

## 2016-08-24 DIAGNOSIS — Z981 Arthrodesis status: Secondary | ICD-10-CM | POA: Diagnosis not present

## 2016-08-24 DIAGNOSIS — M48061 Spinal stenosis, lumbar region without neurogenic claudication: Secondary | ICD-10-CM | POA: Diagnosis not present

## 2016-08-24 DIAGNOSIS — M4726 Other spondylosis with radiculopathy, lumbar region: Secondary | ICD-10-CM | POA: Diagnosis not present

## 2016-08-24 DIAGNOSIS — M5136 Other intervertebral disc degeneration, lumbar region: Secondary | ICD-10-CM | POA: Diagnosis not present

## 2016-08-24 DIAGNOSIS — M5416 Radiculopathy, lumbar region: Secondary | ICD-10-CM | POA: Diagnosis not present

## 2016-09-03 ENCOUNTER — Encounter: Payer: Self-pay | Admitting: Internal Medicine

## 2016-09-20 ENCOUNTER — Other Ambulatory Visit: Payer: Self-pay | Admitting: Internal Medicine

## 2016-09-20 NOTE — Telephone Encounter (Signed)
PT called and needs a refill on traMADol (ULTRAM) 50 MG tablet. She has one pill left for tonight. Thank you!  Call pt @ [facility], [place]

## 2016-09-20 NOTE — Telephone Encounter (Signed)
Last filled 03/29/16 90 2rf

## 2016-09-21 MED ORDER — TRAMADOL HCL 50 MG PO TABS: 50.0000 mg | ORAL_TABLET | Freq: Three times a day (TID) | ORAL | 2 refills | Status: DC | PRN

## 2016-09-21 NOTE — Telephone Encounter (Signed)
faxed

## 2016-09-24 ENCOUNTER — Ambulatory Visit
Admission: RE | Admit: 2016-09-24 | Discharge: 2016-09-24 | Disposition: A | Discharge status: Home / Self Care | Payer: PPO | Source: Ambulatory Visit | Attending: Internal Medicine | Admitting: Internal Medicine

## 2016-09-24 DIAGNOSIS — Z1239 Encounter for other screening for malignant neoplasm of breast: Secondary | ICD-10-CM

## 2016-09-24 DIAGNOSIS — Z1231 Encounter for screening mammogram for malignant neoplasm of breast: Secondary | ICD-10-CM | POA: Insufficient documentation

## 2016-09-24 DIAGNOSIS — Z23 Encounter for immunization: Secondary | ICD-10-CM

## 2016-10-16 ENCOUNTER — Other Ambulatory Visit: Payer: Self-pay

## 2016-10-16 MED ORDER — SIMVASTATIN 10 MG PO TABS: ORAL_TABLET | ORAL | 2 refills | Status: DC

## 2016-10-24 DIAGNOSIS — Z6837 Body mass index (BMI) 37.0-37.9, adult: Secondary | ICD-10-CM | POA: Diagnosis not present

## 2016-10-24 DIAGNOSIS — M5416 Radiculopathy, lumbar region: Secondary | ICD-10-CM | POA: Diagnosis not present

## 2016-10-24 DIAGNOSIS — I10 Essential (primary) hypertension: Secondary | ICD-10-CM | POA: Diagnosis not present

## 2016-10-31 ENCOUNTER — Ambulatory Visit: Payer: PPO

## 2016-11-02 ENCOUNTER — Other Ambulatory Visit: Payer: Self-pay | Admitting: Neurological Surgery

## 2016-11-02 DIAGNOSIS — M5416 Radiculopathy, lumbar region: Secondary | ICD-10-CM

## 2016-11-05 ENCOUNTER — Ambulatory Visit
Admission: RE | Admit: 2016-11-05 | Discharge: 2016-11-05 | Disposition: A | Discharge status: Home / Self Care | Payer: PPO | Source: Ambulatory Visit | Attending: Neurological Surgery | Admitting: Neurological Surgery

## 2016-11-05 DIAGNOSIS — M48061 Spinal stenosis, lumbar region without neurogenic claudication: Secondary | ICD-10-CM | POA: Insufficient documentation

## 2016-11-05 DIAGNOSIS — M5116 Intervertebral disc disorders with radiculopathy, lumbar region: Secondary | ICD-10-CM | POA: Diagnosis not present

## 2016-11-05 DIAGNOSIS — M5416 Radiculopathy, lumbar region: Secondary | ICD-10-CM

## 2016-11-05 DIAGNOSIS — Z981 Arthrodesis status: Secondary | ICD-10-CM | POA: Diagnosis not present

## 2016-11-20 ENCOUNTER — Ambulatory Visit: Payer: PPO

## 2016-11-23 DIAGNOSIS — M5416 Radiculopathy, lumbar region: Secondary | ICD-10-CM | POA: Diagnosis not present

## 2016-11-23 DIAGNOSIS — M4186 Other forms of scoliosis, lumbar region: Secondary | ICD-10-CM | POA: Diagnosis not present

## 2016-11-28 ENCOUNTER — Ambulatory Visit (INDEPENDENT_AMBULATORY_CARE_PROVIDER_SITE_OTHER): Payer: PPO

## 2016-11-28 VITALS — BP 116/78 | HR 61 | Temp 98.0°F | Resp 14 | Ht 64.0 in | Wt 218.8 lb

## 2016-11-28 DIAGNOSIS — Z0000 Encounter for general adult medical examination without abnormal findings: Secondary | ICD-10-CM

## 2016-11-28 NOTE — Progress Notes (Signed)
Subjective:   Erin Camacho is a 71 y.o. female who presents for Medicare Annual (Subsequent) preventive examination.  Review of Systems:  No ROS.  Medicare Wellness Visit.  Cardiac Risk Factors include: advanced age (>60men, >31 women);hypertension;obesity (BMI >30kg/m2)     Objective:     Vitals: BP 116/78 (BP Location: Right Arm, Patient Position: Sitting, Cuff Size: Normal)   Pulse 61   Temp 98 F (36.7 C) (Oral)   Resp 14   Ht 5\' 4"  (1.626 m)   Wt 218 lb 12.8 oz (99.2 kg)   SpO2 98%   BMI 37.56 kg/m   Body mass index is 37.56 kg/m.   Tobacco History  Smoking Status  . Never Smoker  Smokeless Tobacco  . Never Used     Counseling given: Not Answered   Past Medical History:  Diagnosis Date  . Allergy    hay fever  . Anxiety   . Arthritis   . Chronic back pain   . Diabetes mellitus    no meds      pt states she is not diabetic  . GERD (gastroesophageal reflux disease)   . Hyperlipidemia   . Hypertension   . Pneumonia   . Spinal stenosis   . UTI (lower urinary tract infection)    Past Surgical History:  Procedure Laterality Date  . ABDOMINAL HYSTERECTOMY  1973  . ABDOMINAL HYSTERECTOMY    . APPENDECTOMY    . BACK SURGERY  04/17/13  . CHOLECYSTECTOMY  1995  . HERNIA REPAIR  08/2006  . lap band  08/2003  . SEPTOPLASTY  04/1978  . TONSILLECTOMY AND ADENOIDECTOMY  1979  . TOTAL HIP ARTHROPLASTY  02/2012   Right, Dr. Marry Guan   Family History  Problem Relation Age of Onset  . Hypertension Mother   . Stroke Mother   . Heart disease Mother   . Hypertension Father   . Stroke Father   . Heart disease Father   . Arthritis Sister   . Diabetes Sister   . Dementia Sister   . Breast cancer Sister   . Diabetes Brother   . Diabetes Brother   . Breast cancer Other 47  . Cancer Other     breast  . Diabetes Other   . Kidney disease Neg Hx   . Bladder Cancer Neg Hx    History  Sexual Activity  . Sexual activity: No    Outpatient Encounter  Prescriptions as of 11/28/2016  Medication Sig  . ALPRAZolam (XANAX) 0.25 MG tablet Take 1 tablet (0.25 mg total) by mouth daily as needed for anxiety.  . Cholecalciferol (VITAMIN D-3) 1000 UNITS CAPS Take 1 capsule by mouth daily.  . diclofenac (VOLTAREN) 75 MG EC tablet   . fexofenadine (ALLEGRA) 180 MG tablet Take 1 tablet (180 mg total) by mouth daily.  . fluticasone (FLONASE) 50 MCG/ACT nasal spray Place 2 sprays into both nostrils daily.  Marland Kitchen losartan-hydrochlorothiazide (HYZAAR) 50-12.5 MG tablet Take 1 tablet by mouth daily.  . metoprolol succinate (TOPROL-XL) 25 MG 24 hr tablet Take 1 tablet (25 mg total) by mouth daily.  . Multiple Vitamin (MULTIVITAMIN) tablet Take 1 tablet by mouth daily.  Marland Kitchen nystatin cream (MYCOSTATIN) Apply 1 application topically 2 (two) times daily.  . sertraline (ZOLOFT) 100 MG tablet TAKE ONE-HALF TABLET DAILY  . simvastatin (ZOCOR) 10 MG tablet TAKE ONE-HALF TABLET (5 MG TOTAL) BY MOUTH AT BEDTIME.  . traMADol (ULTRAM) 50 MG tablet Take 1 tablet (50 mg total) by mouth  3 (three) times daily as needed.  . traZODone (DESYREL) 100 MG tablet TAKE ONE (1) TABLET AT BEDTIME  . [DISCONTINUED] estradiol (ESTRACE VAGINAL) 0.1 MG/GM vaginal cream Apply 0.5mg  (pea-sized amount)  just inside the vaginal introitus with a finger-tip every night for two weeks and then Monday, Wednesday and Friday nights.  . [DISCONTINUED] omeprazole (PRILOSEC) 40 MG capsule Take 1 capsule (40 mg total) by mouth daily.  Marland Kitchen albuterol (PROVENTIL) (2.5 MG/3ML) 0.083% nebulizer solution Take 3 mLs (2.5 mg total) by nebulization every 6 (six) hours as needed for wheezing. Please disp with nebulizer machine  . cyclobenzaprine (FLEXERIL) 5 MG tablet Take 1 tablet (5 mg total) by mouth 3 (three) times daily as needed for muscle spasms. (Patient not taking: Reported on 11/28/2016)  . [DISCONTINUED] hydrocortisone (PROCTOZONE-HC) 2.5 % rectal cream Place 1 application rectally 2 (two) times daily. (Patient not  taking: Reported on 11/28/2016)   No facility-administered encounter medications on file as of 11/28/2016.     Activities of Daily Living In your present state of health, do you have any difficulty performing the following activities: 11/28/2016  Hearing? N  Vision? N  Difficulty concentrating or making decisions? N  Walking or climbing stairs? Y  Dressing or bathing? N  Doing errands, shopping? N  Preparing Food and eating ? N  Using the Toilet? N  In the past six months, have you accidently leaked urine? N  Do you have problems with loss of bowel control? N  Managing your Medications? N  Managing your Finances? N  Housekeeping or managing your Housekeeping? N  Some recent data might be hidden    Patient Care Team: [name], MD as PCP - General (Internal Medicine)    Assessment:    This is a routine wellness examination for Erin Camacho. The goal of the wellness visit is to assist the patient how to close the gaps in care and create a preventative care plan for the patient.   Taking calcium VIT D3 as appropriate/Osteoporosis risk reviewed.  Medications reviewed; taking without issues or barriers.  Safety issues reviewed; smoke detectors in the home. No firearms in the home. Wears seatbelts when driving or riding with others. No violence in the home.  No identified risk were noted; The patient was oriented x 3; appropriate in dress and manner and no objective failures at ADL's or IADL's.   BMI; discussed the importance of a healthy diet, water intake and exercise. Educational material provided.  HTN; followed by PCP.  Dexa Scan; discussed. Deferred per patient preference to follow up with PCP.  Educational material provided.  Patient Concerns: None at this time. Follow up with PCP as needed. Exercise Activities and Dietary recommendations  Current Exercise Habits: The patient does not participate in regular exercise at present  Goals    . Increase physical  activity          Stretch! Chair/standing range of motion exercises, as demomstrated. Swimming as tolerated.    . Increase water intake          Stay hydrated and drink plenty of fluids    . Weight (lb) < 200 lb (90.7 kg)          Decrease sugar intake. Low carb foods.  Educational material provided.      Fall Risk Fall Risk  11/28/2016 08/13/2016 11/29/2014 01/18/2014 02/06/2013  Falls in the past year? Yes No No No Yes  Number falls in past yr: 1 - - - 1  Injury with Fall?  No - - - -  Follow up Falls prevention discussed;Education provided - - - -   Depression Screen PHQ 2/9 Scores 11/28/2016 08/13/2016 11/29/2014 01/18/2014  PHQ - 2 Score 0 0 0 0     Cognitive Function MMSE - Mini Mental State Exam 11/28/2016  Orientation to time 5  Orientation to Place 5  Registration 3  Attention/ Calculation 5  Recall 3  Language- name 2 objects 2  Language- repeat 1  Language- follow 3 step command 3  Language- read & follow direction 1  Write a sentence 1  Copy design 1  Total score 30        Immunization History  Administered Date(s) Administered  . Influenza Split 09/25/2012  . Influenza Whole 09/07/2013  . Influenza, High Dose Seasonal PF 08/13/2016  . Influenza,inj,Quad PF,36+ Mos 07/27/2014, 08/25/2015  . Pneumococcal Conjugate-13 01/18/2014  . Pneumococcal Polysaccharide-23 03/15/2012  . Tdap 02/28/2012  . Zoster 08/27/2012   Screening Tests Health Maintenance  Topic Date Due  . DEXA SCAN  05/08/2010  . FOOT EXAM  07/15/2014  . HEMOGLOBIN A1C  02/19/2017  . OPHTHALMOLOGY EXAM  05/30/2017  . COLONOSCOPY  07/07/2017  . MAMMOGRAM  09/24/2017  . TETANUS/TDAP  02/27/2022  . INFLUENZA VACCINE  Completed  . ZOSTAVAX  Completed  . Hepatitis C Screening  Completed  . PNA vac Low Risk Adult  Completed      Plan:    End of life planning; Advance aging; Advanced directives discussed. Copy of current HCPOA/Living Will requested.  Medicare Attestation I have  personally reviewed: [name] medical and social history Their use of alcohol, tobacco or illicit drugs Their current medications and supplements [name] functional ability including ADLs,fall risks, home safety risks, cognitive, and hearing and visual impairment Diet and physical activities Evidence for depression   [name] weight, height, BMI, and visual acuity have been recorded in the chart.  I have made referrals and provided education to the patient based on review of the above and I have provided the patient with a written personalized care plan for preventive services.    During the course of the visit the patient was educated and counseled about the following appropriate screening and preventive services:   Vaccines to include Pneumoccal, Influenza, Hepatitis B, Td, Zostavax, HCV  Electrocardiogram  Cardiovascular Disease  Colorectal cancer screening  Bone density screening  Diabetes screening  Glaucoma screening  Mammography/PAP  Nutrition counseling   Patient Instructions (the written plan) was given to the patient.   Varney Biles, LPN  [id number]

## 2016-11-28 NOTE — Patient Instructions (Addendum)
[name] , Thank you for taking time to come for your Medicare Wellness Visit. I appreciate your ongoing commitment to your health goals. Please review the following plan we discussed and let me know if I can assist you in the future.   Follow up with Dr. Nicki Reaper as needed.  These are the goals we discussed: Goals    . Increase physical activity          Stretch! Chair/standing range of motion exercises, as demomstrated. Swimming as tolerated.    . Increase water intake          Stay hydrated and drink plenty of fluids    . Weight (lb) < 200 lb (90.7 kg)          Decrease sugar intake. Low carb foods.  Educational material provided.       This is a list of the screening recommended for you and due dates:  Health Maintenance  Topic Date Due  . DEXA scan (bone density measurement)  05/08/2010  . Complete foot exam   07/15/2014  . Hemoglobin A1C  02/19/2017  . Eye exam for diabetics  05/30/2017  . Colon Cancer Screening  07/07/2017  . Mammogram  09/24/2017  . Tetanus Vaccine  02/27/2022  . Flu Shot  Completed  . Shingles Vaccine  Completed  .  Hepatitis C: One time screening is recommended by Center for Disease Control  (CDC) for  adults born from [birth year] through 1966.   Completed  . Pneumonia vaccines  Completed      Fall Prevention in the Home Introduction Falls can cause injuries. They can happen to people of all ages. There are many things you can do to make your home safe and to help prevent falls. What can I do on the outside of my home?  Regularly fix the edges of walkways and driveways and fix any cracks.  Remove anything that might make you trip as you walk through a door, such as a raised step or threshold.  Trim any bushes or trees on the path to your home.  Use bright outdoor lighting.  Clear any walking paths of anything that might make someone trip, such as rocks or tools.  Regularly check to see if handrails are loose or broken. Make sure that both  sides of any steps have handrails.  Any raised decks and porches should have guardrails on the edges.  Have any leaves, snow, or ice cleared regularly.  Use sand or salt on walking paths during winter.  Clean up any spills in your garage right away. This includes oil or grease spills. What can I do in the bathroom?  Use night lights.  Install grab bars by the toilet and in the tub and shower. Do not use towel bars as grab bars.  Use non-skid mats or decals in the tub or shower.  If you need to sit down in the shower, use a plastic, non-slip stool.  Keep the floor dry. Clean up any water that spills on the floor as soon as it happens.  Remove soap buildup in the tub or shower regularly.  Attach bath mats securely with double-sided non-slip rug tape.  Do not have throw rugs and other things on the floor that can make you trip. What can I do in the bedroom?  Use night lights.  Make sure that you have a light by your bed that is easy to reach.  Do not use any sheets or blankets that are too  big for your bed. They should not hang down onto the floor.  Have a firm chair that has side arms. You can use this for support while you get dressed.  Do not have throw rugs and other things on the floor that can make you trip. What can I do in the kitchen?  Clean up any spills right away.  Avoid walking on wet floors.  Keep items that you use a lot in easy-to-reach places.  If you need to reach something above you, use a strong step stool that has a grab bar.  Keep electrical cords out of the way.  Do not use floor polish or wax that makes floors slippery. If you must use wax, use non-skid floor wax.  Do not have throw rugs and other things on the floor that can make you trip. What can I do with my stairs?  Do not leave any items on the stairs.  Make sure that there are handrails on both sides of the stairs and use them. Fix handrails that are broken or loose. Make sure that  handrails are as long as the stairways.  Check any carpeting to make sure that it is firmly attached to the stairs. Fix any carpet that is loose or worn.  Avoid having throw rugs at the top or bottom of the stairs. If you do have throw rugs, attach them to the floor with carpet tape.  Make sure that you have a light switch at the top of the stairs and the bottom of the stairs. If you do not have them, ask someone to add them for you. What else can I do to help prevent falls?  Wear shoes that:  Do not have high heels.  Have rubber bottoms.  Are comfortable and fit you well.  Are closed at the toe. Do not wear sandals.  If you use a stepladder:  Make sure that it is fully opened. Do not climb a closed stepladder.  Make sure that both sides of the stepladder are locked into place.  Ask someone to hold it for you, if possible.  Clearly mark and make sure that you can see:  Any grab bars or handrails.  First and last steps.  Where the edge of each step is.  Use tools that help you move around (mobility aids) if they are needed. These include:  Canes.  Walkers.  Scooters.  Crutches.  Turn on the lights when you go into a dark area. Replace any light bulbs as soon as they burn out.  Set up your furniture so you have a clear path. Avoid moving your furniture around.  If any of your floors are uneven, fix them.  If there are any pets around you, be aware of where they are.  Review your medicines with your doctor. Some medicines can make you feel dizzy. This can increase your chance of falling. Ask your doctor what other things that you can do to help prevent falls. This information is not intended to replace advice given to you by your health care provider. Make sure you discuss any questions you have with your health care provider. Document Released: 09/01/2009 Document Revised: 04/12/2016 Document Reviewed: 12/10/2014  2017 [employer]vier

## 2016-12-05 NOTE — Progress Notes (Signed)
I have reviewed the above note and agree.  Abriel Hattery, M.D.  

## 2016-12-13 ENCOUNTER — Ambulatory Visit: Payer: PPO | Admitting: Internal Medicine

## 2016-12-13 ENCOUNTER — Encounter: Payer: Self-pay | Admitting: Internal Medicine

## 2016-12-13 ENCOUNTER — Ambulatory Visit (INDEPENDENT_AMBULATORY_CARE_PROVIDER_SITE_OTHER): Payer: PPO | Admitting: Internal Medicine

## 2016-12-13 VITALS — BP 118/66 | HR 72 | Temp 98.3°F | Resp 18 | Wt 218.5 lb

## 2016-12-13 DIAGNOSIS — I10 Essential (primary) hypertension: Secondary | ICD-10-CM | POA: Diagnosis not present

## 2016-12-13 DIAGNOSIS — R05 Cough: Secondary | ICD-10-CM

## 2016-12-13 DIAGNOSIS — E2839 Other primary ovarian failure: Secondary | ICD-10-CM | POA: Diagnosis not present

## 2016-12-13 DIAGNOSIS — R059 Cough, unspecified: Secondary | ICD-10-CM

## 2016-12-13 DIAGNOSIS — R739 Hyperglycemia, unspecified: Secondary | ICD-10-CM

## 2016-12-13 DIAGNOSIS — E669 Obesity, unspecified: Secondary | ICD-10-CM | POA: Diagnosis not present

## 2016-12-13 DIAGNOSIS — K219 Gastro-esophageal reflux disease without esophagitis: Secondary | ICD-10-CM

## 2016-12-13 DIAGNOSIS — Z9889 Other specified postprocedural states: Secondary | ICD-10-CM | POA: Diagnosis not present

## 2016-12-13 MED ORDER — AMOXICILLIN-POT CLAVULANATE 875-125 MG PO TABS: 1.0000 | ORAL_TABLET | Freq: Two times a day (BID) | ORAL | 0 refills | Status: DC

## 2016-12-13 MED ORDER — ALBUTEROL SULFATE HFA 108 (90 BASE) MCG/ACT IN AERS: 2.0000 | INHALATION_SPRAY | Freq: Four times a day (QID) | RESPIRATORY_TRACT | 1 refills | Status: DC | PRN

## 2016-12-13 MED ORDER — PREDNISONE 10 MG PO TABS: ORAL_TABLET | ORAL | 0 refills | Status: DC

## 2016-12-13 NOTE — Progress Notes (Signed)
Pre visit review using our clinic review tool, if applicable. No additional management support is needed unless otherwise documented below in the visit note. 

## 2016-12-13 NOTE — Progress Notes (Signed)
Patient ID: Erin Camacho, female   DOB: 1946/09/24, 71 y.o.   MRN: [id number]   Subjective:    Patient ID: Erin Camacho, female    DOB: 1946/01/20, 71 y.o.   MRN: [id number]  HPI  Patient here for a scheduled follow up.  She reports that she started having some congestion and symptoms 10 days ago.  She reports increased chest congestion and cough.  Some wheezing.  No vomiting.  Is eating.  Some constipation previously.  Some soft stool now.  No diarrhea.  She started taking amoxicillin that she had at home for dental procedures.  Is some better.  Still with persistent cough.  States otherwise doing relatively well.  Back pain improved.     Past Medical History:  Diagnosis Date  . Allergy    hay fever  . Anxiety   . Arthritis   . Chronic back pain   . Diabetes mellitus    no meds      pt states she is not diabetic  . GERD (gastroesophageal reflux disease)   . Hyperlipidemia   . Hypertension   . Pneumonia   . Spinal stenosis   . UTI (lower urinary tract infection)    Past Surgical History:  Procedure Laterality Date  . ABDOMINAL HYSTERECTOMY  1973  . ABDOMINAL HYSTERECTOMY    . APPENDECTOMY    . BACK SURGERY  04/17/13  . CHOLECYSTECTOMY  1995  . HERNIA REPAIR  08/2006  . lap band  08/2003  . SEPTOPLASTY  04/1978  . TONSILLECTOMY AND ADENOIDECTOMY  1979  . TOTAL HIP ARTHROPLASTY  02/2012   Right, Dr. Marry Guan   Family History  Problem Relation Age of Onset  . Hypertension Mother   . Stroke Mother   . Heart disease Mother   . Hypertension Father   . Stroke Father   . Heart disease Father   . Arthritis Sister   . Diabetes Sister   . Dementia Sister   . Breast cancer Sister   . Diabetes Brother   . Diabetes Brother   . Breast cancer Other 47  . Cancer Other     breast  . Diabetes Other   . Kidney disease Neg Hx   . Bladder Cancer Neg Hx    Social History   Social History  . Marital status: Married    Spouse name: N/A  . Number of children: N/A  . Years of  education: N/A   Social History Main Topics  . Smoking status: Never Smoker  . Smokeless tobacco: Never Used  . Alcohol use 0.6 oz/week    1 Glasses of wine per week     Comment: occ  . Drug use: No  . Sexual activity: No   Other Topics Concern  . None   Social History Narrative   Lives in [place] with husband.      Work - retired Psychologist, clinical Prescriptions as of 12/13/2016  Medication Sig  . ALPRAZolam (XANAX) 0.25 MG tablet Take 1 tablet (0.25 mg total) by mouth daily as needed for anxiety.  . Cholecalciferol (VITAMIN D-3) 1000 UNITS CAPS Take 1 capsule by mouth daily.  . cyclobenzaprine (FLEXERIL) 5 MG tablet Take 1 tablet (5 mg total) by mouth 3 (three) times daily as needed for muscle spasms.  . diclofenac (VOLTAREN) 75 MG EC tablet   . fexofenadine (ALLEGRA) 180 MG tablet Take 1 tablet (180 mg total) by mouth daily.  Marland Kitchen losartan-hydrochlorothiazide (HYZAAR) 50-12.5  MG tablet Take 1 tablet by mouth daily.  . metoprolol succinate (TOPROL-XL) 25 MG 24 hr tablet Take 1 tablet (25 mg total) by mouth daily.  . Multiple Vitamin (MULTIVITAMIN) tablet Take 1 tablet by mouth daily.  Marland Kitchen nystatin cream (MYCOSTATIN) Apply 1 application topically 2 (two) times daily.  . sertraline (ZOLOFT) 100 MG tablet TAKE ONE-HALF TABLET DAILY  . simvastatin (ZOCOR) 10 MG tablet TAKE ONE-HALF TABLET (5 MG TOTAL) BY MOUTH AT BEDTIME.  . traMADol (ULTRAM) 50 MG tablet Take 1 tablet (50 mg total) by mouth 3 (three) times daily as needed.  . traZODone (DESYREL) 100 MG tablet TAKE ONE (1) TABLET AT BEDTIME  . [DISCONTINUED] fluticasone (FLONASE) 50 MCG/ACT nasal spray Place 2 sprays into both nostrils daily.  Marland Kitchen albuterol (PROVENTIL HFA;VENTOLIN HFA) 108 (90 Base) MCG/ACT inhaler Inhale 2 puffs into the lungs every 6 (six) hours as needed for wheezing or shortness of breath.  Marland Kitchen albuterol (PROVENTIL) (2.5 MG/3ML) 0.083% nebulizer solution Take 3 mLs (2.5 mg total) by nebulization  every 6 (six) hours as needed for wheezing. Please disp with nebulizer machine  . amoxicillin-clavulanate (AUGMENTIN) 875-125 MG tablet Take 1 tablet by mouth 2 (two) times daily.  . predniSONE (DELTASONE) 10 MG tablet Take 4 tablets x 1 day and then decrease by 1/2 tablet per day until down to zero mg.   No facility-administered encounter medications on file as of 12/13/2016.     Review of Systems  Constitutional: Negative for appetite change and unexpected weight change.  HENT: Positive for congestion. Negative for sinus pressure.   Respiratory: Positive for cough and wheezing. Negative for chest tightness and shortness of breath.   Cardiovascular: Negative for chest pain, palpitations and leg swelling.  Gastrointestinal: Negative for abdominal pain, diarrhea, nausea and vomiting.  Genitourinary: Negative for difficulty urinating and dysuria.  Musculoskeletal: Negative for joint swelling and myalgias.       Back pain is better.   Skin: Negative for color change and rash.  Neurological: Negative for dizziness, light-headedness and headaches.  Psychiatric/Behavioral: Negative for agitation and dysphoric mood.       Objective:    Physical Exam  Constitutional: She appears well-developed and well-nourished. No distress.  HENT:  Nose: Nose normal.  Mouth/Throat: Oropharynx is clear and moist.  Neck: Neck supple. No thyromegaly present.  Cardiovascular: Normal rate and regular rhythm.   Pulmonary/Chest: Breath sounds normal. No respiratory distress.  Some increased cough with forced expiration.  Some increased wheezing at end expiration.    Abdominal: Soft. Bowel sounds are normal. There is no tenderness.  Musculoskeletal: She exhibits no edema or tenderness.  Lymphadenopathy:    She has no cervical adenopathy.  Skin: No rash noted. No erythema.  Psychiatric: She has a normal mood and affect. Her behavior is normal.    BP 118/66 (BP Location: Left Arm, Patient Position: Sitting,  Cuff Size: Large)   Pulse 72   Temp 98.3 F (36.8 C) (Oral)   Resp 18   Wt 218 lb 8 oz (99.1 kg)   SpO2 97%   BMI 37.51 kg/m  Wt Readings from Last 3 Encounters:  12/13/16 218 lb 8 oz (99.1 kg)  11/28/16 218 lb 12.8 oz (99.2 kg)  08/13/16 227 lb 3.2 oz (103.1 kg)     Lab Results  Component Value Date   WBC 5.8 08/21/2016   HGB 13.6 08/21/2016   HCT 40.4 08/21/2016   PLT 210.0 08/21/2016   GLUCOSE 113 (H) 08/21/2016   CHOL 175  08/21/2016   TRIG 118.0 08/21/2016   HDL 59.80 08/21/2016   LDLDIRECT 146.5 09/19/2012   LDLCALC 92 08/21/2016   ALT 15 08/21/2016   AST 19 08/21/2016   NA 143 08/21/2016   K 4.1 08/21/2016   CL 104 08/21/2016   CREATININE 0.87 08/21/2016   BUN 18 08/21/2016   CO2 32 08/21/2016   TSH 2.66 08/21/2016   INR 1.0 11/22/2015   HGBA1C 6.1 08/21/2016   MICROALBUR 0.6 02/28/2015    Ct Lumbar Spine Wo Contrast  Result Date: 11/05/2016 CLINICAL DATA:  Acute lumbar radiculopathy.  Lumbar fusion. EXAM: CT LUMBAR SPINE WITHOUT CONTRAST TECHNIQUE: Multidetector CT imaging of the lumbar spine was performed without intravenous contrast administration. Multiplanar CT image reconstructions were also generated. COMPARISON:  Lumbar spine radiographs 03/29/2016. Lumbar MRI 10/11/2015 FINDINGS: Segmentation: Normal Alignment: Mild retrolisthesis L1-2 is similar to the prior MRI. Slight anterolisthesis at L4-5. 9 mm anterolisthesis L5-S1 has progressed since the prior MRI. Vertebrae: Negative for fracture or mass lesion. Paraspinal and other soft tissues: Paraspinous muscles are symmetric with generalized muscle atrophy. No fluid collection or mass. No retroperitoneal mass or adenopathy. Atherosclerotic calcification in the aorta and iliacs without aneurysm. Disc levels: T12-L1:  Negative L1-2: Advanced disc degeneration with cystic changes in the endplates. Retrolisthesis with disc and facet degeneration and moderate spinal stenosis. Gas in the disc space. L2-3: PLIF with  solid fusion. Posterior decompression. Negative for stenosis L3-4: PLIF with solid fusion. Posterior decompression without stenosis L4-5: PLIF with solid fusion. Posterior decompression without stenosis. L5-S1: 9 mm anterolisthesis. PLIF with solid interbody fusion. Hardware in satisfactory position without loosening. 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 PLIF L2 through S1 with solid fusion at L2-3, L3-4, L4-5, and L5-S1. Electronically Signed   By: Franchot Gallo M.D.   On: 11/05/2016 16:48       Assessment & Plan:   Problem List Items Addressed This Visit    Elevated blood sugar    Low carb diet and exercise.  Follow met b and a1c.  Follow sugars on prednisone.        Relevant Orders   Hemoglobin A1c   Microalbumin / creatinine urine ratio   GERD (gastroesophageal reflux disease)    Has noticed some acid reflux.  Zantac bid.  Follow.        Hypertension    Blood pressure under good control.  Continue same medication regimen.  Follow pressures.  Follow metabolic panel.         Relevant Orders   Hepatic function panel   Lipid panel   Basic metabolic panel   Microalbumin / creatinine urine ratio   Obesity (BMI 30-39.9)    Diet and exercise.  Follow.       Status post lumbar spine operation    Back pain better.  Follow.        Other Visit Diagnoses    Estrogen deficiency    -  Primary   Relevant Orders   DG Bone Density   Cough       with increased cough and congestion.  wheezing.  already on abx.  continue augmentin.  probiotic.  prednisone taper.  hold cxr.  inhalers.  follow.        [name], MD

## 2016-12-13 NOTE — Patient Instructions (Addendum)
Take a probiotic daily with your antibiotic and for two weeks after completing you antibiotic.    Examples of probiotics:  Align, florastor or culturelle  Take ranitidine 150mg  twice a day

## 2016-12-18 ENCOUNTER — Other Ambulatory Visit: Payer: Self-pay | Admitting: Internal Medicine

## 2016-12-18 DIAGNOSIS — J309 Allergic rhinitis, unspecified: Secondary | ICD-10-CM

## 2016-12-19 DIAGNOSIS — M533 Sacrococcygeal disorders, not elsewhere classified: Secondary | ICD-10-CM | POA: Diagnosis not present

## 2016-12-23 ENCOUNTER — Encounter: Payer: Self-pay | Admitting: Internal Medicine

## 2016-12-23 DIAGNOSIS — K219 Gastro-esophageal reflux disease without esophagitis: Secondary | ICD-10-CM | POA: Insufficient documentation

## 2016-12-23 NOTE — Assessment & Plan Note (Signed)
Back pain better.  Follow.

## 2016-12-23 NOTE — Assessment & Plan Note (Signed)
Has noticed some acid reflux.  Zantac bid.  Follow.

## 2016-12-23 NOTE — Assessment & Plan Note (Signed)
Diet and exercise.  Follow.  

## 2016-12-23 NOTE — Assessment & Plan Note (Signed)
Blood pressure under good control.  Continue same medication regimen.  Follow pressures.  Follow metabolic panel.   

## 2016-12-23 NOTE — Assessment & Plan Note (Signed)
Low carb diet and exercise.  Follow met b and a1c.  Follow sugars on prednisone.

## 2016-12-24 DIAGNOSIS — H02054 Trichiasis without entropian left upper eyelid: Secondary | ICD-10-CM | POA: Diagnosis not present

## 2016-12-26 ENCOUNTER — Ambulatory Visit: Admission: RE | Admit: 2016-12-26 | Payer: PPO | Source: Ambulatory Visit

## 2017-01-02 ENCOUNTER — Ambulatory Visit: Payer: PPO

## 2017-01-07 ENCOUNTER — Ambulatory Visit
Admission: RE | Admit: 2017-01-07 | Discharge: 2017-01-07 | Disposition: A | Discharge status: Home / Self Care | Payer: PPO | Source: Ambulatory Visit | Attending: Internal Medicine | Admitting: Internal Medicine

## 2017-01-07 DIAGNOSIS — M85852 Other specified disorders of bone density and structure, left thigh: Secondary | ICD-10-CM | POA: Diagnosis not present

## 2017-01-07 DIAGNOSIS — E2839 Other primary ovarian failure: Secondary | ICD-10-CM | POA: Diagnosis present

## 2017-01-08 ENCOUNTER — Encounter: Payer: Self-pay | Admitting: Internal Medicine

## 2017-01-08 DIAGNOSIS — G8929 Other chronic pain: Secondary | ICD-10-CM | POA: Diagnosis not present

## 2017-01-08 DIAGNOSIS — M545 Low back pain: Secondary | ICD-10-CM | POA: Diagnosis not present

## 2017-01-10 DIAGNOSIS — M545 Low back pain: Secondary | ICD-10-CM | POA: Diagnosis not present

## 2017-01-10 DIAGNOSIS — G8929 Other chronic pain: Secondary | ICD-10-CM | POA: Diagnosis not present

## 2017-01-10 NOTE — Telephone Encounter (Signed)
Copy of e mailed to patient.

## 2017-01-14 DIAGNOSIS — G8929 Other chronic pain: Secondary | ICD-10-CM | POA: Diagnosis not present

## 2017-01-14 DIAGNOSIS — M545 Low back pain: Secondary | ICD-10-CM | POA: Diagnosis not present

## 2017-01-16 DIAGNOSIS — M79605 Pain in left leg: Secondary | ICD-10-CM | POA: Diagnosis not present

## 2017-01-16 DIAGNOSIS — G8929 Other chronic pain: Secondary | ICD-10-CM | POA: Diagnosis not present

## 2017-01-16 DIAGNOSIS — M6281 Muscle weakness (generalized): Secondary | ICD-10-CM | POA: Diagnosis not present

## 2017-01-16 DIAGNOSIS — M545 Low back pain: Secondary | ICD-10-CM | POA: Diagnosis not present

## 2017-01-21 DIAGNOSIS — M545 Low back pain: Secondary | ICD-10-CM | POA: Diagnosis not present

## 2017-01-21 DIAGNOSIS — G8929 Other chronic pain: Secondary | ICD-10-CM | POA: Diagnosis not present

## 2017-01-23 DIAGNOSIS — G8929 Other chronic pain: Secondary | ICD-10-CM | POA: Diagnosis not present

## 2017-01-23 DIAGNOSIS — M545 Low back pain: Secondary | ICD-10-CM | POA: Diagnosis not present

## 2017-01-27 ENCOUNTER — Ambulatory Visit
Admission: EM | Admit: 2017-01-27 | Discharge: 2017-01-27 | Disposition: A | Discharge status: Home / Self Care | Payer: PPO | Attending: Family Medicine | Admitting: Family Medicine

## 2017-01-27 ENCOUNTER — Encounter: Payer: Self-pay | Admitting: Gynecology

## 2017-01-27 DIAGNOSIS — J069 Acute upper respiratory infection, unspecified: Secondary | ICD-10-CM | POA: Diagnosis not present

## 2017-01-27 DIAGNOSIS — B9789 Other viral agents as the cause of diseases classified elsewhere: Secondary | ICD-10-CM

## 2017-01-27 MED ORDER — HYDROCOD POLST-CPM POLST ER 10-8 MG/5ML PO SUER: 5.0000 mL | Freq: Two times a day (BID) | ORAL | 0 refills | Status: DC | PRN

## 2017-01-27 MED ORDER — PREDNISONE 20 MG PO TABS: 20.0000 mg | ORAL_TABLET | Freq: Every day | ORAL | 0 refills | Status: DC

## 2017-01-27 NOTE — ED Triage Notes (Signed)
Per patient c/o x yesterday low grade fever at home / cough / chest congestion.

## 2017-01-27 NOTE — ED Provider Notes (Signed)
[facility]    CSN: [id number] Arrival date & time: 01/27/17  0932     History   Chief Complaint Chief Complaint  Patient presents with  . Cough    HPI Erin Camacho is a 71 y.o. female.   The history is provided by the patient.  Cough  Associated symptoms: wheezing   URI  Presenting symptoms: congestion and cough   Severity:  Moderate Onset quality:  Sudden Duration: symptoms started yesterday with runny nose, nasal congestion and cough; patient reports "it's going into my chest"; and "if I don't get an antibiotic, it's going to turn into bronchitis" Timing:  Constant Progression:  Worsening Chronicity:  New Relieved by:  Nebulizer treatments (relief of wheezing) Associated symptoms: wheezing   Associated symptoms: no sinus pain   Risk factors: being elderly   Risk factors: no chronic cardiac disease, no chronic kidney disease, no chronic respiratory disease (patient denies history of asthma or COPD; however states has a nebulizer machine at home and uses albuterol as needed for wheezing), no diabetes mellitus, no immunosuppression, no recent illness, no recent travel and no sick contacts     Past Medical History:  Diagnosis Date  . Allergy    hay fever  . Anxiety   . Arthritis   . Chronic back pain   . Diabetes mellitus    no meds      pt states she is not diabetic  . GERD (gastroesophageal reflux disease)   . Hyperlipidemia   . Hypertension   . Pneumonia   . Spinal stenosis   . UTI (lower urinary tract infection)     Patient Active Problem List   Diagnosis Date Noted  . GERD (gastroesophageal reflux disease) 12/23/2016  . Atrophic vaginitis 03/18/2016  . Dysuria 03/18/2016  . Recurrent UTI 02/20/2016  . Spondylolisthesis at L5-S1 level 12/06/2015  . Preop examination 11/22/2015  . Rhinitis, allergic 11/22/2015  . Suprapubic pressure 08/25/2015  . Elevated blood sugar 11/29/2014  . Abnormal blood sugar 11/29/2014  . Diaphoresis 07/27/2014   . Right leg pain 07/27/2014  . 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 Bilateral shoulder pain 02/24/2014  . Skin lesion 01/18/2014  . History of bariatric surgery 01/18/2014  . History of surgical procedure 01/18/2014  . Major depression 05/14/2013  . Major depressive disorder with single episode 05/14/2013  . Status post lumbar spine operation 04/17/2013  . Healthcare maintenance 01/07/2013  . Hyperlipidemia LDL goal < 100 08/22/2012  . HLD (hyperlipidemia) 08/22/2012  . Hypertension 07/07/2012  . Insomnia 07/07/2012  . Obesity (BMI 30-39.9) 07/07/2012  . Essential (primary) hypertension 07/07/2012  . Adiposity 07/07/2012    Past Surgical History:  Procedure Laterality Date  . ABDOMINAL HYSTERECTOMY  1973  . ABDOMINAL HYSTERECTOMY    . APPENDECTOMY    . BACK SURGERY  04/17/13  . CHOLECYSTECTOMY  1995  . HERNIA REPAIR  08/2006  . lap band  08/2003  . SEPTOPLASTY  04/1978  . TONSILLECTOMY AND ADENOIDECTOMY  1979  . TOTAL HIP ARTHROPLASTY  02/2012   Right, Dr. Marry Guan    OB History    No data available       Home Medications    Prior to Admission medications   Medication Sig Start Date End Date Taking? Authorizing Provider  albuterol (PROVENTIL HFA;VENTOLIN HFA) 108 (90 Base) MCG/ACT inhaler Inhale 2 puffs into the lungs every 6 (six) hours as needed for wheezing or shortness of breath. 12/13/16  Yes [name], MD  ALPRAZolam Duanne Moron) 0.25 MG tablet Take 1 tablet (0.25 mg total) by mouth daily as needed for anxiety. 08/15/16  Yes [name], MD  Cholecalciferol (VITAMIN D-3) 1000 UNITS CAPS Take 1 capsule by mouth daily.   Yes Historical Provider, MD  cyclobenzaprine (FLEXERIL) 5 MG tablet Take 1 tablet (5 mg total) by mouth 3 (three) times daily as needed for muscle spasms. 08/27/14  Yes Jackolyn Confer, MD  diclofenac (VOLTAREN) 75 MG EC tablet  03/05/16  Yes Historical Provider, MD  fexofenadine (ALLEGRA) 180 MG  tablet Take 1 tablet (180 mg total) by mouth daily. 01/18/14  Yes Jackolyn Confer, MD  fluticasone (FLONASE) 50 MCG/ACT nasal spray USE TWO SPRAYS IN EACH NOSTRIL EACH DAY AS DIRECTED BY PHYSICIAN 12/18/16  Yes [name], MD  losartan-hydrochlorothiazide (HYZAAR) 50-12.5 MG tablet Take 1 tablet by mouth daily. 07/17/16  Yes [name], MD  metoprolol succinate (TOPROL-XL) 25 MG 24 hr tablet Take 1 tablet (25 mg total) by mouth daily. 07/17/16  Yes [name], MD  Multiple Vitamin (MULTIVITAMIN) tablet Take 1 tablet by mouth daily.   Yes Historical Provider, MD  nystatin cream (MYCOSTATIN) Apply 1 application topically 2 (two) times daily. 08/13/16  Yes [name], MD  sertraline (ZOLOFT) 100 MG tablet TAKE ONE-HALF TABLET DAILY 05/21/16  Yes Jackolyn Confer, MD  simvastatin (ZOCOR) 10 MG tablet TAKE ONE-HALF TABLET (5 MG TOTAL) BY MOUTH AT BEDTIME. 10/16/16  Yes [name], MD  traMADol (ULTRAM) 50 MG tablet Take 1 tablet (50 mg total) by mouth 3 (three) times daily as needed. 09/21/16  Yes [name], MD  traZODone (DESYREL) 100 MG tablet TAKE ONE (1) TABLET AT BEDTIME 06/18/16  Yes Jackolyn Confer, MD  albuterol (PROVENTIL) (2.5 MG/3ML) 0.083% nebulizer solution Take 3 mLs (2.5 mg total) by nebulization every 6 (six) hours as needed for wheezing. Please disp with nebulizer machine 03/17/13 11/22/15  Jackolyn Confer, MD  amoxicillin-clavulanate (AUGMENTIN) 875-125 MG tablet Take 1 tablet by mouth 2 (two) times daily. 12/13/16   [name], MD  chlorpheniramine-HYDROcodone (TUSSIONEX PENNKINETIC ER) 10-8 MG/5ML SUER Take 5 mLs by mouth every 12 (twelve) hours as needed. 01/27/17   Norval Gable, MD  predniSONE (DELTASONE) 20 MG tablet Take 1 tablet (20 mg total) by mouth daily. 01/27/17   Norval Gable, MD    Family History Family History  Problem Relation Age of Onset  . Hypertension Mother   . Stroke Mother   . Heart disease Mother   . Hypertension Father   . Stroke  Father   . Heart disease Father   . Arthritis Sister   . Diabetes Sister   . Dementia Sister   . Breast cancer Sister   . Diabetes Brother   . Diabetes Brother   . Breast cancer Other 47  . Cancer Other     breast  . Diabetes Other   . Kidney disease Neg Hx   . Bladder Cancer Neg Hx     Social History Social History  Substance Use Topics  . Smoking status: Never Smoker  . Smokeless tobacco: Never Used  . Alcohol use 0.6 oz/week    1 Glasses of wine per week     Comment: occ     Allergies   Patient has no known allergies.   Review of Systems Review of Systems  HENT: Positive for congestion. Negative for sinus pain.   Respiratory: Positive for cough and wheezing.  Physical Exam Triage Vital Signs ED Triage Vitals  Enc Vitals Group     BP 01/27/17 0944 128/70     Pulse Rate 01/27/17 0944 78     Resp 01/27/17 0944 16     Temp 01/27/17 0944 98.4 F (36.9 C)     Temp Source 01/27/17 0944 Oral     SpO2 01/27/17 0944 98 %     Weight 01/27/17 0946 218 lb (98.9 kg)     Height 01/27/17 0946 5\' 5"  (1.651 m)     Head Circumference --      Peak Flow --      Pain Score 01/27/17 0949 7     Pain Loc --      Pain Edu? --      Excl. in [place]? --    No data found.   Updated Vital Signs BP 128/70 (BP Location: Left Arm)   Pulse 78   Temp 98.4 F (36.9 C) (Oral)   Resp 16   Ht 5\' 5"  (1.651 m)   Wt 218 lb (98.9 kg)   SpO2 98%   BMI 36.28 kg/m   Visual Acuity Right Eye Distance:   Left Eye Distance:   Bilateral Distance:    Right Eye Near:   Left Eye Near:    Bilateral Near:     Physical Exam  Constitutional: She appears well-developed and well-nourished. No distress.  HENT:  Head: Normocephalic and atraumatic.  Right Ear: Tympanic membrane, external ear and ear canal normal.  Left Ear: Tympanic membrane, external ear and ear canal normal.  Nose: Rhinorrhea present. No mucosal edema, nose lacerations, sinus tenderness, nasal deformity, septal deviation  or nasal septal hematoma. No epistaxis.  No foreign bodies. Right sinus exhibits no maxillary sinus tenderness and no frontal sinus tenderness. Left sinus exhibits no maxillary sinus tenderness and no frontal sinus tenderness.  Mouth/Throat: Uvula is midline, oropharynx is clear and moist and mucous membranes are normal. No oropharyngeal exudate or posterior oropharyngeal erythema. No tonsillar exudate.  Eyes: Conjunctivae and EOM are normal. Pupils are equal, round, and reactive to light. Right eye exhibits no discharge. Left eye exhibits no discharge. No scleral icterus.  Neck: Normal range of motion. Neck supple. No thyromegaly present.  Cardiovascular: Normal rate, regular rhythm and normal heart sounds.   Pulmonary/Chest: Effort normal and breath sounds normal. No respiratory distress. She has no wheezes. She has no rales.  Lymphadenopathy:    She has no cervical adenopathy.  Skin: She is not diaphoretic.  Nursing note and vitals reviewed.    UC Treatments / Results  Labs (all labs ordered are listed, but only abnormal results are displayed) Labs Reviewed - No data to display  EKG  EKG Interpretation None       Radiology No results found.  Procedures Procedures (including critical care time)  Medications Ordered in UC Medications - No data to display   Initial Impression / Assessment and Plan / UC Course  I have reviewed the triage vital signs and the nursing notes.  Pertinent labs & imaging results that were available during my care of the patient were reviewed by me and considered in my medical decision making (see chart for details).       Final Clinical Impressions(s) / UC Diagnoses   Final diagnoses:  Viral URI with cough    New Prescriptions Discharge Medication List as of 01/27/2017 10:17 AM    START taking these medications   Details  chlorpheniramine-HYDROcodone (TUSSIONEX PENNKINETIC ER) 10-8 MG/5ML  SUER Take 5 mLs by mouth every 12 (twelve) hours  as needed., Starting Sun 01/27/2017, Normal       1. diagnosis reviewed with patient; explained to patient with a 1 day h/o of her reported symptoms and exam findings being consistent with a viral URI (ie: "cold"), antibiotics are not indicated; patient appeared to not be happy with this explanation stating " I know my body and I need an antibiotic"; explained to patient if symptoms are not improving or worsening over the next week, she should be evaluated  2. rx as per orders above; reviewed possible side effects, interactions, risks and benefits    3. Recommend supportive treatment with rest, fluids  4. Follow-up prn if symptoms worsen or don't improve   Norval Gable, MD 01/27/17 1035

## 2017-01-28 ENCOUNTER — Other Ambulatory Visit: Payer: Self-pay | Admitting: Internal Medicine

## 2017-01-29 ENCOUNTER — Encounter: Payer: Self-pay | Admitting: Family

## 2017-01-29 ENCOUNTER — Ambulatory Visit (INDEPENDENT_AMBULATORY_CARE_PROVIDER_SITE_OTHER): Payer: PPO | Admitting: Family

## 2017-01-29 VITALS — BP 126/64 | HR 65 | Temp 98.8°F | Ht 65.0 in | Wt 220.4 lb

## 2017-01-29 DIAGNOSIS — J111 Influenza due to unidentified influenza virus with other respiratory manifestations: Secondary | ICD-10-CM | POA: Diagnosis not present

## 2017-01-29 DIAGNOSIS — J40 Bronchitis, not specified as acute or chronic: Secondary | ICD-10-CM | POA: Diagnosis not present

## 2017-01-29 MED ORDER — OSELTAMIVIR PHOSPHATE 75 MG PO CAPS: 75.0000 mg | ORAL_CAPSULE | Freq: Two times a day (BID) | ORAL | 0 refills | Status: DC

## 2017-01-29 MED ORDER — ALBUTEROL SULFATE (2.5 MG/3ML) 0.083% IN NEBU: 2.5000 mg | INHALATION_SOLUTION | Freq: Four times a day (QID) | RESPIRATORY_TRACT | 1 refills | Status: DC | PRN

## 2017-01-29 MED ORDER — HYDROCOD POLST-CPM POLST ER 10-8 MG/5ML PO SUER: 5.0000 mL | Freq: Every evening | ORAL | 0 refills | Status: DC | PRN

## 2017-01-29 NOTE — Patient Instructions (Addendum)
Use inhaler at home  tussionex as needed at bedtime  This illness can last up to 10 days. Complete medications as directed. Tamiflu on occasion has undesirable side effects including nausea and vomiting; if this occurs, you may stop the medication and continue symptom management at home.   Have all household members treated or placed on prophylaxis medications by contacting their primary care providers. Ensure adequate clear fluid intake. Obtain plenty of rest and provide symptomatic care. Use appropriate dose of Acetaminophen or Ibuprofen as needed for fever and/or pain. 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   Please follow up with our clinic if you develop a fever greater than 101 F, symptoms worsen, or do not resolve in the next week.

## 2017-01-29 NOTE — Progress Notes (Signed)
Subjective:    Patient ID: Erin Camacho, female    DOB: 1946-09-30, 71 y.o.   MRN: [id number]  CC: Erin Camacho is a 71 y.o. female who presents today for an acute visit.    HPI: Chief complaint of productive  cough, fever, worsening 4 days. Tmax 100.1 two days ago. No lung disease.  See 4 days ago at urgent care and given prednisone. Endorses wheezing and has nebulizer at home - 'just hasn't gotten around to it. ' Has been using OTC robitussin with some relief but last night had bad coughing fits. No CP, SOB.   History of allergies, hypertension    HISTORY:  Past Medical History:  Diagnosis Date  . Allergy    hay fever  . Anxiety   . Arthritis   . Chronic back pain   . Diabetes mellitus    no meds      pt states she is not diabetic  . GERD (gastroesophageal reflux disease)   . Hyperlipidemia   . Hypertension   . Pneumonia   . Spinal stenosis   . UTI (lower urinary tract infection)    Past Surgical History:  Procedure Laterality Date  . ABDOMINAL HYSTERECTOMY  1973  . ABDOMINAL HYSTERECTOMY    . APPENDECTOMY    . BACK SURGERY  04/17/13  . CHOLECYSTECTOMY  1995  . HERNIA REPAIR  08/2006  . lap band  08/2003  . SEPTOPLASTY  04/1978  . TONSILLECTOMY AND ADENOIDECTOMY  1979  . TOTAL HIP ARTHROPLASTY  02/2012   Right, Dr. Marry Guan   Family History  Problem Relation Age of Onset  . Hypertension Mother   . Stroke Mother   . Heart disease Mother   . Hypertension Father   . Stroke Father   . Heart disease Father   . Arthritis Sister   . Diabetes Sister   . Dementia Sister   . Breast cancer Sister   . Diabetes Brother   . Diabetes Brother   . Breast cancer Other 47  . Cancer Other     breast  . Diabetes Other   . Kidney disease Neg Hx   . Bladder Cancer Neg Hx     Allergies: Patient has no known allergies. Current Outpatient Prescriptions on File Prior to Visit  Medication Sig Dispense Refill  . albuterol (PROVENTIL HFA;VENTOLIN HFA) 108 (90 Base) MCG/ACT  inhaler Inhale 2 puffs into the lungs every 6 (six) hours as needed for wheezing or shortness of breath. 1 Inhaler 1  . ALPRAZolam (XANAX) 0.25 MG tablet Take 1 tablet (0.25 mg total) by mouth daily as needed for anxiety. 30 tablet 0  . amoxicillin-clavulanate (AUGMENTIN) 875-125 MG tablet Take 1 tablet by mouth 2 (two) times daily. 14 tablet 0  . Cholecalciferol (VITAMIN D-3) 1000 UNITS CAPS Take 1 capsule by mouth daily.    . cyclobenzaprine (FLEXERIL) 5 MG tablet Take 1 tablet (5 mg total) by mouth 3 (three) times daily as needed for muscle spasms. 30 tablet 1  . diclofenac (VOLTAREN) 75 MG EC tablet     . fexofenadine (ALLEGRA) 180 MG tablet Take 1 tablet (180 mg total) by mouth daily. 90 tablet 4  . fluticasone (FLONASE) 50 MCG/ACT nasal spray USE TWO SPRAYS IN EACH NOSTRIL EACH DAY AS DIRECTED BY PHYSICIAN 48 g 0  . losartan-hydrochlorothiazide (HYZAAR) 50-12.5 MG tablet TAKE ONE (1) TABLET EACH DAY 90 tablet 2  . metoprolol succinate (TOPROL-XL) 25 MG 24 hr tablet TAKE ONE (1) TABLET EACH  DAY 90 tablet 1  . Multiple Vitamin (MULTIVITAMIN) tablet Take 1 tablet by mouth daily.    Marland Kitchen nystatin cream (MYCOSTATIN) Apply 1 application topically 2 (two) times daily. 30 g 0  . predniSONE (DELTASONE) 20 MG tablet Take 1 tablet (20 mg total) by mouth daily. 5 tablet 0  . sertraline (ZOLOFT) 100 MG tablet TAKE ONE-HALF TABLET DAILY 45 tablet 2  . simvastatin (ZOCOR) 10 MG tablet TAKE ONE-HALF TABLET (5 MG TOTAL) BY MOUTH AT BEDTIME. 90 tablet 2  . traMADol (ULTRAM) 50 MG tablet Take 1 tablet (50 mg total) by mouth 3 (three) times daily as needed. 90 tablet 2  . traZODone (DESYREL) 100 MG tablet TAKE ONE (1) TABLET AT BEDTIME 90 tablet 3   No current facility-administered medications on file prior to visit.     Social History  Substance Use Topics  . Smoking status: Never Smoker  . Smokeless tobacco: Never Used  . Alcohol use 0.6 oz/week    1 Glasses of wine per week     Comment: occ    Review  of Systems  Constitutional: Negative for chills and fever.  HENT: Positive for congestion.   Respiratory: Positive for cough.   Cardiovascular: Negative for chest pain and palpitations.  Gastrointestinal: Negative for nausea and vomiting.      Objective:    BP 126/64   Pulse 65   Temp 98.8 F (37.1 C) (Oral)   Ht 5\' 5"  (1.651 m)   Wt 220 lb 6.4 oz (100 kg)   SpO2 96%   BMI 36.68 kg/m    Physical Exam  Constitutional: She appears well-developed and well-nourished.  HENT:  Head: Normocephalic and atraumatic.  Right Ear: Hearing, tympanic membrane, external ear and ear canal normal. No drainage, swelling or tenderness. No foreign bodies. Tympanic membrane is not erythematous and not bulging. No middle ear effusion. No decreased hearing is noted.  Left Ear: Hearing, tympanic membrane, external ear and ear canal normal. No drainage, swelling or tenderness. No foreign bodies. Tympanic membrane is not erythematous and not bulging.  No middle ear effusion. No decreased hearing is noted.  Nose: Nose normal. No rhinorrhea. Right sinus exhibits no maxillary sinus tenderness and no frontal sinus tenderness. Left sinus exhibits no maxillary sinus tenderness and no frontal sinus tenderness.  Mouth/Throat: Uvula is midline, oropharynx is clear and moist and mucous membranes are normal. No oropharyngeal exudate, posterior oropharyngeal edema, posterior oropharyngeal erythema or tonsillar abscesses.  Eyes: Conjunctivae are normal.  Cardiovascular: Regular rhythm, normal heart sounds and normal pulses.   Pulmonary/Chest: Effort normal and breath sounds normal. She has no wheezes. She has no rhonchi. She has no rales.  Lymphadenopathy:       Head (right side): No submental, no submandibular, no tonsillar, no preauricular, no posterior auricular and no occipital adenopathy present.       Head (left side): No submental, no submandibular, no tonsillar, no preauricular, no posterior auricular and no  occipital adenopathy present.    She has no cervical adenopathy.  Neurological: She is alert.  Skin: Skin is warm and dry.  Psychiatric: She has a normal mood and affect. Her speech is normal and behavior is normal. Thought content normal.  Vitals reviewed. Patient felt significantly better after albuterol treatment. Lung sounds clear and increased      Assessment & Plan:   1. Influenza Flu positive. We'll start Tamiflu. Return precautions given.  - chlorpheniramine-HYDROcodone (TUSSIONEX PENNKINETIC ER) 10-8 MG/5ML SUER; Take 5 mLs by mouth at  bedtime as needed.  Dispense: 120 mL; Refill: 0 - oseltamivir (TAMIFLU) 75 MG capsule; Take 1 capsule (75 mg total) by mouth 2 (two) times daily.  Dispense: 10 capsule; Refill: 0  2. Bronchitis In no acute respiratory distress. Pleased to see the patient improved after nebulizer treatment. SaO2 96%.  - albuterol (PROVENTIL) (2.5 MG/3ML) 0.083% nebulizer solution; Take 3 mLs (2.5 mg total) by nebulization every 6 (six) hours as needed for wheezing. Please disp with nebulizer machine  Dispense: 150 mL; Refill: 1    I have changed Ms. Luhn's chlorpheniramine-HYDROcodone. I am also having her start on oseltamivir. Additionally, I am having her maintain her multivitamin, Vitamin D-3, fexofenadine, cyclobenzaprine, diclofenac, sertraline, traZODone, nystatin cream, ALPRAZolam, traMADol, simvastatin, amoxicillin-clavulanate, albuterol, fluticasone, predniSONE, metoprolol succinate, losartan-hydrochlorothiazide, and albuterol.   Meds ordered this encounter  Medications  . chlorpheniramine-HYDROcodone (TUSSIONEX PENNKINETIC ER) 10-8 MG/5ML SUER    Sig: Take 5 mLs by mouth at bedtime as needed.    Dispense:  120 mL    Refill:  0    Order Specific Question:   Supervising Provider    Answer:   Deborra Medina L [[id number]]  . oseltamivir (TAMIFLU) 75 MG capsule    Sig: Take 1 capsule (75 mg total) by mouth 2 (two) times daily.    Dispense:  10 capsule     Refill:  0    Order Specific Question:   Supervising Provider    Answer:   Deborra Medina L [[id number]]  . albuterol (PROVENTIL) (2.5 MG/3ML) 0.083% nebulizer solution    Sig: Take 3 mLs (2.5 mg total) by nebulization every 6 (six) hours as needed for wheezing. Please disp with nebulizer machine    Dispense:  150 mL    Refill:  1    Dx: Bronchitis 490.0    Order Specific Question:   Supervising Provider    Answer:   Crecencio Mc [[id number]]    Return precautions given.   Risks, benefits, and alternatives of the medications and treatment plan prescribed today were discussed, and patient expressed understanding.   Education regarding symptom management and diagnosis given to patient on AVS.  Continue to follow with [name], MD for routine health maintenance.   Erin Camacho and I agreed with plan.   Mable Paris, FNP

## 2017-01-29 NOTE — Progress Notes (Signed)
Pre visit review using our clinic review tool, if applicable. No additional management support is needed unless otherwise documented below in the visit note. 

## 2017-01-30 ENCOUNTER — Encounter: Payer: Self-pay | Admitting: Family

## 2017-01-30 LAB — POC INFLUENZA A&B (BINAX/QUICKVUE): Influenza A, POC: POSITIVE — AB

## 2017-02-07 DIAGNOSIS — I10 Essential (primary) hypertension: Secondary | ICD-10-CM | POA: Diagnosis not present

## 2017-02-07 DIAGNOSIS — M48061 Spinal stenosis, lumbar region without neurogenic claudication: Secondary | ICD-10-CM | POA: Diagnosis not present

## 2017-02-07 DIAGNOSIS — Z6836 Body mass index (BMI) 36.0-36.9, adult: Secondary | ICD-10-CM | POA: Diagnosis not present

## 2017-02-14 ENCOUNTER — Other Ambulatory Visit (INDEPENDENT_AMBULATORY_CARE_PROVIDER_SITE_OTHER): Payer: PPO

## 2017-02-14 DIAGNOSIS — I10 Essential (primary) hypertension: Secondary | ICD-10-CM

## 2017-02-14 DIAGNOSIS — R739 Hyperglycemia, unspecified: Secondary | ICD-10-CM | POA: Diagnosis not present

## 2017-02-14 LAB — LIPID PANEL
Cholesterol: 163 mg/dL (ref 0–200)
HDL: 61.2 mg/dL
LDL Cholesterol: 81 mg/dL (ref 0–99)
NonHDL: 101.91
Total CHOL/HDL Ratio: 3
Triglycerides: 104 mg/dL (ref 0.0–149.0)
VLDL: 20.8 mg/dL (ref 0.0–40.0)

## 2017-02-14 LAB — BASIC METABOLIC PANEL WITH GFR
BUN: 16 mg/dL (ref 6–23)
CO2: 29 meq/L (ref 19–32)
Calcium: 10 mg/dL (ref 8.4–10.5)
Chloride: 106 meq/L (ref 96–112)
Creatinine, Ser: 0.92 mg/dL (ref 0.40–1.20)
GFR: 63.82 mL/min
Glucose, Bld: 136 mg/dL — ABNORMAL HIGH (ref 70–99)
Potassium: 4.1 meq/L (ref 3.5–5.1)
Sodium: 143 meq/L (ref 135–145)

## 2017-02-14 LAB — HEMOGLOBIN A1C: Hgb A1c MFr Bld: 6.6 % — ABNORMAL HIGH (ref 4.6–6.5)

## 2017-02-14 LAB — HEPATIC FUNCTION PANEL
ALT: 12 U/L (ref 0–35)
AST: 16 U/L (ref 0–37)
Albumin: 4.1 g/dL (ref 3.5–5.2)
Alkaline Phosphatase: 63 U/L (ref 39–117)
Bilirubin, Direct: 0.2 mg/dL (ref 0.0–0.3)
Total Bilirubin: 0.8 mg/dL (ref 0.2–1.2)
Total Protein: 6.4 g/dL (ref 6.0–8.3)

## 2017-02-14 LAB — MICROALBUMIN / CREATININE URINE RATIO
Creatinine,U: 183.9 mg/dL
Microalb Creat Ratio: 0.7 mg/g (ref 0.0–30.0)
Microalb, Ur: 1.2 mg/dL (ref 0.0–1.9)

## 2017-02-18 ENCOUNTER — Encounter: Payer: Self-pay | Admitting: Internal Medicine

## 2017-02-18 ENCOUNTER — Ambulatory Visit (INDEPENDENT_AMBULATORY_CARE_PROVIDER_SITE_OTHER): Payer: PPO | Admitting: Internal Medicine

## 2017-02-18 DIAGNOSIS — E669 Obesity, unspecified: Secondary | ICD-10-CM | POA: Diagnosis not present

## 2017-02-18 DIAGNOSIS — F3341 Major depressive disorder, recurrent, in partial remission: Secondary | ICD-10-CM | POA: Diagnosis not present

## 2017-02-18 DIAGNOSIS — I10 Essential (primary) hypertension: Secondary | ICD-10-CM

## 2017-02-18 DIAGNOSIS — E119 Type 2 diabetes mellitus without complications: Secondary | ICD-10-CM | POA: Diagnosis not present

## 2017-02-18 DIAGNOSIS — E7800 Pure hypercholesterolemia, unspecified: Secondary | ICD-10-CM | POA: Diagnosis not present

## 2017-02-18 DIAGNOSIS — M4317 Spondylolisthesis, lumbosacral region: Secondary | ICD-10-CM

## 2017-02-18 MED ORDER — SERTRALINE HCL 100 MG PO TABS: 50.0000 mg | ORAL_TABLET | Freq: Every day | ORAL | 2 refills | Status: DC

## 2017-02-18 MED ORDER — FLUTICASONE PROPIONATE 50 MCG/ACT NA SUSP: NASAL | 1 refills | Status: DC

## 2017-02-18 MED ORDER — SIMVASTATIN 10 MG PO TABS: ORAL_TABLET | ORAL | 2 refills | Status: DC

## 2017-02-18 NOTE — Progress Notes (Signed)
Pre-visit discussion using our clinic review tool. No additional management support is needed unless otherwise documented below in the visit note.  

## 2017-02-18 NOTE — Progress Notes (Signed)
Patient ID: Erin Camacho, female   DOB: [birth year], 71 y.o.   MRN: [id number]   Subjective:    Patient ID: Erin Camacho, female    DOB: January 26, 1946, 71 y.o.   MRN: [id number]  HPI  Patient here for a scheduled follow up.  She is having increased back pain.  Seeing Dr Ellene Route.  Planning for L1-L2 injection next week.  Takes hydrocodone.  Takes two per day.  Pain limits her activity.  No chest pain.  Had flu recently.  Symptoms resolved.  Breathing stable.  No acid reflux.  No abdominal pain.  Bowels moving.  Discussed lab results.  Cholesterol looks good.  a1c increased.  Discussed low carb diet.     Past Medical History:  Diagnosis Date  . Allergy    hay fever  . Anxiety   . Arthritis   . Chronic back pain   . Diabetes mellitus    no meds      pt states she is not diabetic  . GERD (gastroesophageal reflux disease)   . Hyperlipidemia   . Hypertension   . Pneumonia   . Spinal stenosis   . UTI (lower urinary tract infection)    Past Surgical History:  Procedure Laterality Date  . ABDOMINAL HYSTERECTOMY  1973  . ABDOMINAL HYSTERECTOMY    . APPENDECTOMY    . BACK SURGERY  04/17/13  . CHOLECYSTECTOMY  1995  . HERNIA REPAIR  08/2006  . lap band  08/2003  . SEPTOPLASTY  04/1978  . TONSILLECTOMY AND ADENOIDECTOMY  1979  . TOTAL HIP ARTHROPLASTY  02/2012   Right, Dr. Marry Guan   Family History  Problem Relation Age of Onset  . Hypertension Mother   . Stroke Mother   . Heart disease Mother   . Hypertension Father   . Stroke Father   . Heart disease Father   . Arthritis Sister   . Diabetes Sister   . Dementia Sister   . Breast cancer Sister   . Diabetes Brother   . Diabetes Brother   . Breast cancer Other 47  . Cancer Other     breast  . Diabetes Other   . Kidney disease Neg Hx   . Bladder Cancer Neg Hx    Social History   Social History  . Marital status: Married    Spouse name: N/A  . Number of children: N/A  . Years of education: N/A   Social History Main Topics    . Smoking status: Never Smoker  . Smokeless tobacco: Never Used  . Alcohol use 0.6 oz/week    1 Glasses of wine per week     Comment: occ  . Drug use: No  . Sexual activity: No   Other Topics Concern  . None   Social History Narrative   Lives in [place] with husband.      Work - retired Psychologist, clinical Prescriptions as of 02/18/2017  Medication Sig  . albuterol (PROVENTIL) (2.5 MG/3ML) 0.083% nebulizer solution Take 3 mLs (2.5 mg total) by nebulization every 6 (six) hours as needed for wheezing. Please disp with nebulizer machine  . ALPRAZolam (XANAX) 0.25 MG tablet Take 1 tablet (0.25 mg total) by mouth daily as needed for anxiety.  . Cholecalciferol (VITAMIN D-3) 1000 UNITS CAPS Take 1 capsule by mouth daily.  . cyclobenzaprine (FLEXERIL) 5 MG tablet Take 1 tablet (5 mg total) by mouth 3 (three) times daily as needed for muscle spasms.  Marland Kitchen  diclofenac (VOLTAREN) 75 MG EC tablet   . fexofenadine (ALLEGRA) 180 MG tablet Take 1 tablet (180 mg total) by mouth daily.  . fluticasone (FLONASE) 50 MCG/ACT nasal spray USE TWO SPRAYS IN EACH NOSTRIL EACH DAY AS DIRECTED BY PHYSICIAN  . losartan-hydrochlorothiazide (HYZAAR) 50-12.5 MG tablet TAKE ONE (1) TABLET EACH DAY  . metoprolol succinate (TOPROL-XL) 25 MG 24 hr tablet TAKE ONE (1) TABLET EACH DAY  . Multiple Vitamin (MULTIVITAMIN) tablet Take 1 tablet by mouth daily.  Marland Kitchen nystatin cream (MYCOSTATIN) Apply 1 application topically 2 (two) times daily.  Marland Kitchen oseltamivir (TAMIFLU) 75 MG capsule Take 1 capsule (75 mg total) by mouth 2 (two) times daily.  . sertraline (ZOLOFT) 100 MG tablet Take 0.5 tablets (50 mg total) by mouth daily.  . simvastatin (ZOCOR) 10 MG tablet TAKE ONE-HALF TABLET (5 MG TOTAL) BY MOUTH AT BEDTIME.  . traMADol (ULTRAM) 50 MG tablet Take 1 tablet (50 mg total) by mouth 3 (three) times daily as needed.  . traZODone (DESYREL) 100 MG tablet TAKE ONE (1) TABLET AT BEDTIME  . [DISCONTINUED]  albuterol (PROVENTIL HFA;VENTOLIN HFA) 108 (90 Base) MCG/ACT inhaler Inhale 2 puffs into the lungs every 6 (six) hours as needed for wheezing or shortness of breath.  . [DISCONTINUED] amoxicillin-clavulanate (AUGMENTIN) 875-125 MG tablet Take 1 tablet by mouth 2 (two) times daily.  . [DISCONTINUED] chlorpheniramine-HYDROcodone (TUSSIONEX PENNKINETIC ER) 10-8 MG/5ML SUER Take 5 mLs by mouth at bedtime as needed.  . [DISCONTINUED] fluticasone (FLONASE) 50 MCG/ACT nasal spray USE TWO SPRAYS IN EACH NOSTRIL EACH DAY AS DIRECTED BY PHYSICIAN  . [DISCONTINUED] losartan-hydrochlorothiazide (HYZAAR) 50-12.5 MG tablet TAKE ONE (1) TABLET EACH DAY  . [DISCONTINUED] metoprolol succinate (TOPROL-XL) 25 MG 24 hr tablet TAKE ONE (1) TABLET EACH DAY  . [DISCONTINUED] predniSONE (DELTASONE) 20 MG tablet Take 1 tablet (20 mg total) by mouth daily.  . [DISCONTINUED] sertraline (ZOLOFT) 100 MG tablet TAKE ONE-HALF TABLET DAILY  . [DISCONTINUED] simvastatin (ZOCOR) 10 MG tablet TAKE ONE-HALF TABLET (5 MG TOTAL) BY MOUTH AT BEDTIME.  Marland Kitchen HYDROcodone-acetaminophen (NORCO/VICODIN) 5-325 MG tablet Take 1 tablet by mouth daily.   No facility-administered encounter medications on file as of 02/18/2017.     Review of Systems  Constitutional: Negative for appetite change and unexpected weight change.  HENT: Negative for congestion and sinus pressure.   Respiratory: Negative for cough, chest tightness and shortness of breath.   Cardiovascular: Negative for chest pain, palpitations and leg swelling.  Gastrointestinal: Negative for abdominal pain, diarrhea, nausea and vomiting.  Genitourinary: Negative for difficulty urinating and dysuria.  Musculoskeletal: Positive for back pain. Negative for joint swelling.  Skin: Negative for color change and rash.  Neurological: Negative for dizziness, light-headedness and headaches.  Psychiatric/Behavioral: Negative for agitation and dysphoric mood.       Objective:    Physical Exam   Constitutional: She appears well-developed and well-nourished. No distress.  HENT:  Nose: Nose normal.  Mouth/Throat: Oropharynx is clear and moist.  Neck: Neck supple. No thyromegaly present.  Cardiovascular: Normal rate and regular rhythm.   Pulmonary/Chest: Breath sounds normal. No respiratory distress. She has no wheezes.  Abdominal: Soft. Bowel sounds are normal. There is no tenderness.  Musculoskeletal: She exhibits no edema or tenderness.  Lymphadenopathy:    She has no cervical adenopathy.  Skin: No rash noted. No erythema.  Psychiatric: She has a normal mood and affect. Her behavior is normal.    BP 130/64 (BP Location: Left Arm, Patient Position: Sitting, Cuff Size: Large)  Pulse 60   Temp 98.6 F (37 C) (Oral)   Resp 16   Ht '5\' 5"'$  (1.651 m)   Wt 221 lb 12.8 oz (100.6 kg)   SpO2 98%   BMI 36.91 kg/m  Wt Readings from Last 3 Encounters:  02/18/17 221 lb 12.8 oz (100.6 kg)  01/29/17 220 lb 6.4 oz (100 kg)  01/27/17 218 lb (98.9 kg)     Lab Results  Component Value Date   WBC 5.8 08/21/2016   HGB 13.6 08/21/2016   HCT 40.4 08/21/2016   PLT 210.0 08/21/2016   GLUCOSE 136 (H) 02/14/2017   CHOL 163 02/14/2017   TRIG 104.0 02/14/2017   HDL 61.20 02/14/2017   LDLDIRECT 146.5 09/19/2012   LDLCALC 81 02/14/2017   ALT 12 02/14/2017   AST 16 02/14/2017   NA 143 02/14/2017   K 4.1 02/14/2017   CL 106 02/14/2017   CREATININE 0.92 02/14/2017   BUN 16 02/14/2017   CO2 29 02/14/2017   TSH 2.66 08/21/2016   INR 1.0 11/22/2015   HGBA1C 6.6 (H) 02/14/2017   MICROALBUR 1.2 02/14/2017       Assessment & Plan:   Problem List Items Addressed This Visit    Diabetes mellitus ([place])    Discussed low carb diet and exercise.  Follow met b and a1c.       Relevant Medications   simvastatin (ZOCOR) 10 MG tablet   losartan-hydrochlorothiazide (HYZAAR) 50-12.5 MG tablet   Other Relevant Orders   Hemoglobin A1c   Hypercholesterolemia    On simvastatin.  Low  cholesterol diet and exercise.  Follow lipid panel and liver function tests.   Lab Results  Component Value Date   CHOL 163 02/14/2017   HDL 61.20 02/14/2017   LDLCALC 81 02/14/2017   LDLDIRECT 146.5 09/19/2012   TRIG 104.0 02/14/2017   CHOLHDL 3 02/14/2017        Relevant Medications   simvastatin (ZOCOR) 10 MG tablet   metoprolol succinate (TOPROL-XL) 25 MG 24 hr tablet   losartan-hydrochlorothiazide (HYZAAR) 50-12.5 MG tablet   Other Relevant Orders   Hepatic function panel   Lipid panel   Hypertension    Blood pressure under good control.  Continue same medication regimen.  Follow pressures.  Follow metabolic panel.        Relevant Medications   simvastatin (ZOCOR) 10 MG tablet   metoprolol succinate (TOPROL-XL) 25 MG 24 hr tablet   losartan-hydrochlorothiazide (HYZAAR) 50-12.5 MG tablet   Other Relevant Orders   Basic metabolic panel   Major depression    On sertraline.  Stable.  Follow.       Relevant Medications   sertraline (ZOLOFT) 100 MG tablet   Obesity (BMI 30-39.9)    Diet and exercise.  Follow.       Spondylolisthesis at L5-S1 level    Seeing Dr Ellene Route.  Planning for injection.  Increased back pain.  Has f/u next week.            [name], MD

## 2017-02-19 ENCOUNTER — Encounter: Payer: Self-pay | Admitting: Internal Medicine

## 2017-02-19 MED ORDER — METOPROLOL SUCCINATE ER 25 MG PO TB24: ORAL_TABLET | ORAL | 1 refills | Status: DC

## 2017-02-19 MED ORDER — LOSARTAN POTASSIUM-HCTZ 50-12.5 MG PO TABS: ORAL_TABLET | ORAL | 2 refills | Status: DC

## 2017-02-19 NOTE — Assessment & Plan Note (Signed)
Discussed low carb diet and exercise.  Follow met b and a1c.  

## 2017-02-19 NOTE — Assessment & Plan Note (Signed)
Seeing Dr Ellene Route.  Planning for injection.  Increased back pain.  Has f/u next week.

## 2017-02-19 NOTE — Assessment & Plan Note (Signed)
On simvastatin.  Low cholesterol diet and exercise.  Follow lipid panel and liver function tests.   Lab Results  Component Value Date   CHOL 163 02/14/2017   HDL 61.20 02/14/2017   LDLCALC 81 02/14/2017   LDLDIRECT 146.5 09/19/2012   TRIG 104.0 02/14/2017   CHOLHDL 3 02/14/2017

## 2017-02-19 NOTE — Assessment & Plan Note (Signed)
Diet and exercise.  Follow.  

## 2017-02-19 NOTE — Assessment & Plan Note (Signed)
On sertraline.  Stable.  Follow.

## 2017-02-19 NOTE — Assessment & Plan Note (Signed)
Blood pressure under good control.  Continue same medication regimen.  Follow pressures.  Follow metabolic panel.   

## 2017-02-26 DIAGNOSIS — M5416 Radiculopathy, lumbar region: Secondary | ICD-10-CM | POA: Diagnosis not present

## 2017-02-26 DIAGNOSIS — Z981 Arthrodesis status: Secondary | ICD-10-CM | POA: Diagnosis not present

## 2017-03-12 DIAGNOSIS — J301 Allergic rhinitis due to pollen: Secondary | ICD-10-CM | POA: Diagnosis not present

## 2017-03-14 DIAGNOSIS — J301 Allergic rhinitis due to pollen: Secondary | ICD-10-CM | POA: Diagnosis not present

## 2017-03-18 DIAGNOSIS — J301 Allergic rhinitis due to pollen: Secondary | ICD-10-CM | POA: Diagnosis not present

## 2017-03-21 DIAGNOSIS — J301 Allergic rhinitis due to pollen: Secondary | ICD-10-CM | POA: Diagnosis not present

## 2017-03-25 DIAGNOSIS — J301 Allergic rhinitis due to pollen: Secondary | ICD-10-CM | POA: Diagnosis not present

## 2017-03-28 DIAGNOSIS — J301 Allergic rhinitis due to pollen: Secondary | ICD-10-CM | POA: Diagnosis not present

## 2017-04-01 DIAGNOSIS — J301 Allergic rhinitis due to pollen: Secondary | ICD-10-CM | POA: Diagnosis not present

## 2017-04-04 DIAGNOSIS — J301 Allergic rhinitis due to pollen: Secondary | ICD-10-CM | POA: Diagnosis not present

## 2017-04-08 DIAGNOSIS — J301 Allergic rhinitis due to pollen: Secondary | ICD-10-CM | POA: Diagnosis not present

## 2017-04-11 DIAGNOSIS — Z96641 Presence of right artificial hip joint: Secondary | ICD-10-CM | POA: Diagnosis not present

## 2017-04-11 DIAGNOSIS — M25551 Pain in right hip: Secondary | ICD-10-CM | POA: Diagnosis not present

## 2017-04-16 DIAGNOSIS — J301 Allergic rhinitis due to pollen: Secondary | ICD-10-CM | POA: Diagnosis not present

## 2017-04-22 DIAGNOSIS — J301 Allergic rhinitis due to pollen: Secondary | ICD-10-CM | POA: Diagnosis not present

## 2017-04-24 ENCOUNTER — Telehealth: Payer: Self-pay

## 2017-04-24 DIAGNOSIS — I6522 Occlusion and stenosis of left carotid artery: Secondary | ICD-10-CM

## 2017-04-24 NOTE — Telephone Encounter (Signed)
Per note on screening that you have reviewed ( have placed back in your yellow folder for this message) Patient would like to schedule the left Carotid u/s. She does not want to have until after next week due to plans to go out of town

## 2017-04-24 NOTE — Telephone Encounter (Signed)
Order placed for carotid ultrasound.

## 2017-04-25 ENCOUNTER — Encounter: Payer: Self-pay | Admitting: Family

## 2017-04-25 ENCOUNTER — Ambulatory Visit (INDEPENDENT_AMBULATORY_CARE_PROVIDER_SITE_OTHER): Payer: PPO | Admitting: Family

## 2017-04-25 VITALS — BP 146/86 | HR 64 | Temp 98.2°F | Ht 65.0 in | Wt 218.6 lb

## 2017-04-25 DIAGNOSIS — J40 Bronchitis, not specified as acute or chronic: Secondary | ICD-10-CM

## 2017-04-25 MED ORDER — AMOXICILLIN 500 MG PO TABS: ORAL_TABLET | ORAL | 0 refills | Status: DC

## 2017-04-25 NOTE — Patient Instructions (Signed)
as discussed  I suspect that your infection is viral in nature.  As discussed, I advise that you wait to fill the antibiotic after 1-2 days of symptom management to see if your symptoms improve. If you do not show improvement, you may take the antibiotic as prescribed.   Continue mucinex and robitussin  Let me know if not better

## 2017-04-25 NOTE — Progress Notes (Signed)
Pre visit review using our clinic review tool, if applicable. No additional management support is needed unless otherwise documented below in the visit note. 

## 2017-04-25 NOTE — Progress Notes (Signed)
Subjective:    Patient ID: Erin Camacho, female    DOB: Jun 02, 1946, 71 y.o.   MRN: [id number]  CC: Erin Camacho is a 71 y.o. female who presents today for an acute visit.    HPI: CC: sore throat,  productive cough, nasal congestion x 5 days, unchanged.   Endorses fever of tmax 99.3; took tyelonol for relief. No chills, ear pain, sinus pain.   Tried mucinex with some relief of 'drainage'.   Worried as going to [facility] this weekend.         HISTORY:  Past Medical History:  Diagnosis Date  . Allergy    hay fever  . Anxiety   . Arthritis   . Chronic back pain   . Diabetes mellitus    no meds      pt states she is not diabetic  . GERD (gastroesophageal reflux disease)   . Hyperlipidemia   . Hypertension   . Pneumonia   . Spinal stenosis   . UTI (lower urinary tract infection)    Past Surgical History:  Procedure Laterality Date  . ABDOMINAL HYSTERECTOMY  1973  . ABDOMINAL HYSTERECTOMY    . APPENDECTOMY    . BACK SURGERY  04/17/13  . CHOLECYSTECTOMY  1995  . HERNIA REPAIR  08/2006  . lap band  08/2003  . SEPTOPLASTY  04/1978  . TONSILLECTOMY AND ADENOIDECTOMY  1979  . TOTAL HIP ARTHROPLASTY  02/2012   Right, Dr. Marry Guan   Family History  Problem Relation Age of Onset  . Hypertension Mother   . Stroke Mother   . Heart disease Mother   . Hypertension Father   . Stroke Father   . Heart disease Father   . Arthritis Sister   . Diabetes Sister   . Dementia Sister   . Breast cancer Sister   . Diabetes Brother   . Diabetes Brother   . Breast cancer Other 47  . Cancer Other        breast  . Diabetes Other   . Kidney disease Neg Hx   . Bladder Cancer Neg Hx     Allergies: Patient has no known allergies. Current Outpatient Prescriptions on File Prior to Visit  Medication Sig Dispense Refill  . albuterol (PROVENTIL) (2.5 MG/3ML) 0.083% nebulizer solution Take 3 mLs (2.5 mg total) by nebulization every 6 (six) hours as needed for wheezing. Please disp with  nebulizer machine 150 mL 1  . ALPRAZolam (XANAX) 0.25 MG tablet Take 1 tablet (0.25 mg total) by mouth daily as needed for anxiety. 30 tablet 0  . Cholecalciferol (VITAMIN D-3) 1000 UNITS CAPS Take 1 capsule by mouth daily.    . cyclobenzaprine (FLEXERIL) 5 MG tablet Take 1 tablet (5 mg total) by mouth 3 (three) times daily as needed for muscle spasms. 30 tablet 1  . diclofenac (VOLTAREN) 75 MG EC tablet     . fexofenadine (ALLEGRA) 180 MG tablet Take 1 tablet (180 mg total) by mouth daily. 90 tablet 4  . fluticasone (FLONASE) 50 MCG/ACT nasal spray USE TWO SPRAYS IN EACH NOSTRIL EACH DAY AS DIRECTED BY PHYSICIAN 48 g 1  . HYDROcodone-acetaminophen (NORCO/VICODIN) 5-325 MG tablet Take 1 tablet by mouth daily.    Marland Kitchen losartan-hydrochlorothiazide (HYZAAR) 50-12.5 MG tablet TAKE ONE (1) TABLET EACH DAY 90 tablet 2  . metoprolol succinate (TOPROL-XL) 25 MG 24 hr tablet TAKE ONE (1) TABLET EACH DAY 90 tablet 1  . Multiple Vitamin (MULTIVITAMIN) tablet Take 1 tablet by mouth daily.    Marland Kitchen  nystatin cream (MYCOSTATIN) Apply 1 application topically 2 (two) times daily. 30 g 0  . oseltamivir (TAMIFLU) 75 MG capsule Take 1 capsule (75 mg total) by mouth 2 (two) times daily. 10 capsule 0  . sertraline (ZOLOFT) 100 MG tablet Take 0.5 tablets (50 mg total) by mouth daily. 45 tablet 2  . simvastatin (ZOCOR) 10 MG tablet TAKE ONE-HALF TABLET (5 MG TOTAL) BY MOUTH AT BEDTIME. 90 tablet 2  . traMADol (ULTRAM) 50 MG tablet Take 1 tablet (50 mg total) by mouth 3 (three) times daily as needed. 90 tablet 2  . traZODone (DESYREL) 100 MG tablet TAKE ONE (1) TABLET AT BEDTIME 90 tablet 3   No current facility-administered medications on file prior to visit.     Social History  Substance Use Topics  . Smoking status: Never Smoker  . Smokeless tobacco: Never Used  . Alcohol use 0.6 oz/week    1 Glasses of wine per week     Comment: occ    Review of Systems  Constitutional: Negative for chills and fever.  HENT:  Positive for congestion, rhinorrhea and sore throat.   Respiratory: Positive for cough.   Cardiovascular: Negative for chest pain and palpitations.  Gastrointestinal: Negative for nausea and vomiting.      Objective:    BP (!) 146/86   Pulse 64   Temp 98.2 F (36.8 C) (Oral)   Ht 5\' 5"  (1.651 m)   Wt 218 lb 9.6 oz (99.2 kg)   SpO2 97%   BMI 36.38 kg/m    Physical Exam  Constitutional: She appears well-developed and well-nourished.  HENT:  Head: Normocephalic and atraumatic.  Right Ear: Hearing, tympanic membrane, external ear and ear canal normal. No drainage, swelling or tenderness. No foreign bodies. Tympanic membrane is not erythematous and not bulging. No middle ear effusion. No decreased hearing is noted.  Left Ear: Hearing, tympanic membrane, external ear and ear canal normal. No drainage, swelling or tenderness. No foreign bodies. Tympanic membrane is not erythematous and not bulging.  No middle ear effusion. No decreased hearing is noted.  Nose: Rhinorrhea present. Right sinus exhibits no maxillary sinus tenderness and no frontal sinus tenderness. Left sinus exhibits no maxillary sinus tenderness and no frontal sinus tenderness.  Mouth/Throat: Uvula is midline, oropharynx is clear and moist and mucous membranes are normal. No oropharyngeal exudate, posterior oropharyngeal edema, posterior oropharyngeal erythema or tonsillar abscesses.  Eyes: Conjunctivae are normal.  Cardiovascular: Regular rhythm, normal heart sounds and normal pulses.   Pulmonary/Chest: Effort normal and breath sounds normal. She has no wheezes. She has no rhonchi. She has no rales.  Lymphadenopathy:       Head (right side): No submental, no submandibular, no tonsillar, no preauricular, no posterior auricular and no occipital adenopathy present.       Head (left side): No submental, no submandibular, no tonsillar, no preauricular, no posterior auricular and no occipital adenopathy present.    She has no  cervical adenopathy.  Neurological: She is alert.  Skin: Skin is warm and dry.  Psychiatric: She has a normal mood and affect. Her speech is normal and behavior is normal. Thought content normal.  Vitals reviewed.      Assessment & Plan:   1. Bronchitis Suspect viral etiology.Duration 5 days. Afebrile. Patient is well-appearing. Accompanied with sore throat, congestion. Advised conservative therapy for For couple days prior to starting antibiotic therapy. Return precautions given. - amoxicillin (AMOXIL) 500 MG tablet; Take two tablets ( total 1000 mg) PO q24h  for 10 days  Dispense: 20 tablet; Refill: 0    I am having Ms. Fontanez start on amoxicillin. I am also having her maintain her multivitamin, Vitamin D-3, fexofenadine, cyclobenzaprine, diclofenac, traZODone, nystatin cream, ALPRAZolam, traMADol, oseltamivir, albuterol, HYDROcodone-acetaminophen, fluticasone, simvastatin, sertraline, metoprolol succinate, and losartan-hydrochlorothiazide.   Meds ordered this encounter  Medications  . amoxicillin (AMOXIL) 500 MG tablet    Sig: Take two tablets ( total 1000 mg) PO q24h for 10 days    Dispense:  20 tablet    Refill:  0    Order Specific Question:   Supervising Provider    Answer:   Crecencio Mc [[id number]]    Return precautions given.   Risks, benefits, and alternatives of the medications and treatment plan prescribed today were discussed, and patient expressed understanding.   Education regarding symptom management and diagnosis given to patient on AVS.  Continue to follow with [name], MD for routine health maintenance.   Erin Camacho and I agreed with plan.   Mable Paris, FNP

## 2017-04-29 DIAGNOSIS — J301 Allergic rhinitis due to pollen: Secondary | ICD-10-CM | POA: Diagnosis not present

## 2017-05-06 DIAGNOSIS — J301 Allergic rhinitis due to pollen: Secondary | ICD-10-CM | POA: Diagnosis not present

## 2017-05-13 DIAGNOSIS — J301 Allergic rhinitis due to pollen: Secondary | ICD-10-CM | POA: Diagnosis not present

## 2017-05-21 DIAGNOSIS — J301 Allergic rhinitis due to pollen: Secondary | ICD-10-CM | POA: Diagnosis not present

## 2017-05-28 DIAGNOSIS — J301 Allergic rhinitis due to pollen: Secondary | ICD-10-CM | POA: Diagnosis not present

## 2017-05-29 ENCOUNTER — Other Ambulatory Visit (INDEPENDENT_AMBULATORY_CARE_PROVIDER_SITE_OTHER): Payer: PPO

## 2017-05-29 DIAGNOSIS — E7800 Pure hypercholesterolemia, unspecified: Secondary | ICD-10-CM | POA: Diagnosis not present

## 2017-05-29 DIAGNOSIS — I10 Essential (primary) hypertension: Secondary | ICD-10-CM

## 2017-05-29 DIAGNOSIS — E119 Type 2 diabetes mellitus without complications: Secondary | ICD-10-CM | POA: Diagnosis not present

## 2017-05-29 LAB — LIPID PANEL
Cholesterol: 158 mg/dL (ref 0–200)
HDL: 66.1 mg/dL
LDL Cholesterol: 72 mg/dL (ref 0–99)
NonHDL: 91.85
Total CHOL/HDL Ratio: 2
Triglycerides: 100 mg/dL (ref 0.0–149.0)
VLDL: 20 mg/dL (ref 0.0–40.0)

## 2017-05-29 LAB — BASIC METABOLIC PANEL WITH GFR
BUN: 17 mg/dL (ref 6–23)
CO2: 30 meq/L (ref 19–32)
Calcium: 10.2 mg/dL (ref 8.4–10.5)
Chloride: 104 meq/L (ref 96–112)
Creatinine, Ser: 0.88 mg/dL (ref 0.40–1.20)
GFR: 67.12 mL/min
Glucose, Bld: 138 mg/dL — ABNORMAL HIGH (ref 70–99)
Potassium: 4.1 meq/L (ref 3.5–5.1)
Sodium: 141 meq/L (ref 135–145)

## 2017-05-29 LAB — HEMOGLOBIN A1C: Hgb A1c MFr Bld: 6 % (ref 4.6–6.5)

## 2017-05-29 LAB — HEPATIC FUNCTION PANEL
ALT: 11 U/L (ref 0–35)
AST: 15 U/L (ref 0–37)
Albumin: 4.2 g/dL (ref 3.5–5.2)
Alkaline Phosphatase: 67 U/L (ref 39–117)
Bilirubin, Direct: 0.1 mg/dL (ref 0.0–0.3)
Total Bilirubin: 0.6 mg/dL (ref 0.2–1.2)
Total Protein: 6.8 g/dL (ref 6.0–8.3)

## 2017-05-30 ENCOUNTER — Encounter: Payer: Self-pay | Admitting: Internal Medicine

## 2017-05-30 ENCOUNTER — Ambulatory Visit (INDEPENDENT_AMBULATORY_CARE_PROVIDER_SITE_OTHER): Payer: PPO

## 2017-05-30 DIAGNOSIS — I6522 Occlusion and stenosis of left carotid artery: Secondary | ICD-10-CM | POA: Diagnosis not present

## 2017-05-31 ENCOUNTER — Encounter: Payer: Self-pay | Admitting: Internal Medicine

## 2017-05-31 ENCOUNTER — Other Ambulatory Visit (HOSPITAL_COMMUNITY)
Admission: RE | Admit: 2017-05-31 | Discharge: 2017-05-31 | Disposition: A | Discharge status: Home / Self Care | Payer: PPO | Source: Ambulatory Visit | Attending: Internal Medicine | Admitting: Internal Medicine

## 2017-05-31 ENCOUNTER — Ambulatory Visit (INDEPENDENT_AMBULATORY_CARE_PROVIDER_SITE_OTHER): Payer: PPO | Admitting: Internal Medicine

## 2017-05-31 VITALS — BP 130/82 | HR 68 | Temp 98.6°F | Resp 12 | Ht 65.0 in | Wt 220.4 lb

## 2017-05-31 DIAGNOSIS — E7800 Pure hypercholesterolemia, unspecified: Secondary | ICD-10-CM

## 2017-05-31 DIAGNOSIS — R5383 Other fatigue: Secondary | ICD-10-CM | POA: Diagnosis not present

## 2017-05-31 DIAGNOSIS — R61 Generalized hyperhidrosis: Secondary | ICD-10-CM | POA: Diagnosis not present

## 2017-05-31 DIAGNOSIS — E119 Type 2 diabetes mellitus without complications: Secondary | ICD-10-CM

## 2017-05-31 DIAGNOSIS — M4317 Spondylolisthesis, lumbosacral region: Secondary | ICD-10-CM | POA: Diagnosis not present

## 2017-05-31 DIAGNOSIS — I10 Essential (primary) hypertension: Secondary | ICD-10-CM | POA: Diagnosis not present

## 2017-05-31 DIAGNOSIS — N952 Postmenopausal atrophic vaginitis: Secondary | ICD-10-CM | POA: Diagnosis not present

## 2017-05-31 DIAGNOSIS — R300 Dysuria: Secondary | ICD-10-CM | POA: Diagnosis present

## 2017-05-31 DIAGNOSIS — K6289 Other specified diseases of anus and rectum: Secondary | ICD-10-CM

## 2017-05-31 DIAGNOSIS — L749 Eccrine sweat disorder, unspecified: Secondary | ICD-10-CM

## 2017-05-31 LAB — URINALYSIS, ROUTINE W REFLEX MICROSCOPIC
Bilirubin Urine: NEGATIVE
Hgb urine dipstick: NEGATIVE
Ketones, ur: NEGATIVE
Leukocytes, UA: NEGATIVE
Nitrite: NEGATIVE
RBC / HPF: NONE SEEN
Specific Gravity, Urine: 1.015
Total Protein, Urine: NEGATIVE
Urine Glucose: NEGATIVE
Urobilinogen, UA: 0.2
WBC, UA: NONE SEEN
pH: 6.5 (ref 5.0–8.0)

## 2017-05-31 MED ORDER — TRAMADOL HCL 50 MG PO TABS: 50.0000 mg | ORAL_TABLET | Freq: Three times a day (TID) | ORAL | 1 refills | Status: DC | PRN

## 2017-05-31 MED ORDER — TRAZODONE HCL 100 MG PO TABS: ORAL_TABLET | ORAL | 0 refills | Status: DC

## 2017-05-31 NOTE — Progress Notes (Signed)
Pre-visit discussion using our clinic review tool. No additional management support is needed unless otherwise documented below in the visit note.  

## 2017-05-31 NOTE — Progress Notes (Signed)
Patient ID: Erin Camacho, female   DOB: 1946/08/16, 71 y.o.   MRN: [id number]   Subjective:    Patient ID: Erin Camacho, female    DOB: 25-Mar-1946, 71 y.o.   MRN: [id number]  HPI  Patient here for a scheduled follow up.  She reports she has noticed when she walks she will break out in a sweat.  Head sweats.  Feels drained.  Some jittery feelings.  No chest pain.  Some fatigue.  Breathing overall relatively stable.  States this has been going on for a while.  Not necessarily associated with not eating or anything she can pinpoint as to the etiology.  No acid reflux.  No abdominal pain.  Bowels moving.  She also reports some burning with urination.  Rectum feels raw.  Some irritation in the vaginal region as well.  No blood.     Past Medical History:  Diagnosis Date  . Allergy    hay fever  . Anxiety   . Arthritis   . Chronic back pain   . Diabetes mellitus    no meds      pt states she is not diabetic  . GERD (gastroesophageal reflux disease)   . Hyperlipidemia   . Hypertension   . Pneumonia   . Spinal stenosis   . UTI (lower urinary tract infection)    Past Surgical History:  Procedure Laterality Date  . ABDOMINAL HYSTERECTOMY  1973  . ABDOMINAL HYSTERECTOMY    . APPENDECTOMY    . BACK SURGERY  04/17/13  . CHOLECYSTECTOMY  1995  . HERNIA REPAIR  08/2006  . lap band  08/2003  . SEPTOPLASTY  04/1978  . TONSILLECTOMY AND ADENOIDECTOMY  1979  . TOTAL HIP ARTHROPLASTY  02/2012   Right, Dr. Ernest Pine   Family History  Problem Relation Age of Onset  . Hypertension Mother   . Stroke Mother   . Heart disease Mother   . Hypertension Father   . Stroke Father   . Heart disease Father   . Arthritis Sister   . Diabetes Sister   . Dementia Sister   . Breast cancer Sister   . Diabetes Brother   . Diabetes Brother   . Breast cancer Other 47  . Cancer Other        breast  . Diabetes Other   . Kidney disease Neg Hx   . Bladder Cancer Neg Hx    Social History   Social History  .  Marital status: Married    Spouse name: N/A  . Number of children: N/A  . Years of education: N/A   Social History Main Topics  . Smoking status: Never Smoker  . Smokeless tobacco: Never Used  . Alcohol use 0.6 oz/week    1 Glasses of wine per week     Comment: occ  . Drug use: No  . Sexual activity: No   Other Topics Concern  . None   Social History Narrative   Lives in [place] with husband.      Work - retired Software engineer Prescriptions as of 05/31/2017  Medication Sig  . albuterol (PROVENTIL) (2.5 MG/3ML) 0.083% nebulizer solution Take 3 mLs (2.5 mg total) by nebulization every 6 (six) hours as needed for wheezing. Please disp with nebulizer machine  . ALPRAZolam (XANAX) 0.25 MG tablet Take 1 tablet (0.25 mg total) by mouth daily as needed for anxiety.  . Cholecalciferol (VITAMIN D-3) 1000 UNITS CAPS Take 1 capsule  by mouth daily.  . cyclobenzaprine (FLEXERIL) 5 MG tablet Take 1 tablet (5 mg total) by mouth 3 (three) times daily as needed for muscle spasms.  . diclofenac (VOLTAREN) 75 MG EC tablet   . fexofenadine (ALLEGRA) 180 MG tablet Take 1 tablet (180 mg total) by mouth daily.  . fluticasone (FLONASE) 50 MCG/ACT nasal spray USE TWO SPRAYS IN EACH NOSTRIL EACH DAY AS DIRECTED BY PHYSICIAN  . HYDROcodone-acetaminophen (NORCO/VICODIN) 5-325 MG tablet Take 1 tablet by mouth daily.  Marland Kitchen losartan-hydrochlorothiazide (HYZAAR) 50-12.5 MG tablet TAKE ONE (1) TABLET EACH DAY  . metoprolol succinate (TOPROL-XL) 25 MG 24 hr tablet TAKE ONE (1) TABLET EACH DAY  . Multiple Vitamin (MULTIVITAMIN) tablet Take 1 tablet by mouth daily.  Marland Kitchen oseltamivir (TAMIFLU) 75 MG capsule Take 1 capsule (75 mg total) by mouth 2 (two) times daily.  . sertraline (ZOLOFT) 100 MG tablet Take 0.5 tablets (50 mg total) by mouth daily.  . simvastatin (ZOCOR) 10 MG tablet TAKE ONE-HALF TABLET (5 MG TOTAL) BY MOUTH AT BEDTIME.  . traMADol (ULTRAM) 50 MG tablet Take 1 tablet (50 mg  total) by mouth 3 (three) times daily as needed.  . traZODone (DESYREL) 100 MG tablet TAKE ONE (1) TABLET AT BEDTIME  . [DISCONTINUED] amoxicillin (AMOXIL) 500 MG tablet Take two tablets ( total 1000 mg) PO q24h for 10 days  . [DISCONTINUED] nystatin cream (MYCOSTATIN) Apply 1 application topically 2 (two) times daily.  . [DISCONTINUED] traMADol (ULTRAM) 50 MG tablet Take 1 tablet (50 mg total) by mouth 3 (three) times daily as needed.  . [DISCONTINUED] traZODone (DESYREL) 100 MG tablet TAKE ONE (1) TABLET AT BEDTIME   No facility-administered encounter medications on file as of 05/31/2017.     Review of Systems  Constitutional: Negative for appetite change and unexpected weight change.  HENT: Negative for congestion and sinus pressure.   Respiratory: Negative for cough, chest tightness and shortness of breath.   Cardiovascular: Negative for chest pain, palpitations and leg swelling.  Gastrointestinal: Negative for abdominal pain, diarrhea, nausea and vomiting.  Genitourinary:       Some burning with urination.  Some vaginal irritation.  A raw feeling around her rectum.  No blood.  Bowels moving normally.   Musculoskeletal: Negative for joint swelling and myalgias.  Skin: Negative for color change and rash.  Neurological: Negative for dizziness, light-headedness and headaches.  Psychiatric/Behavioral: Negative for agitation and dysphoric mood.       Objective:    Physical Exam  Constitutional: She appears well-developed and well-nourished. No distress.  HENT:  Nose: Nose normal.  Mouth/Throat: Oropharynx is clear and moist.  Neck: Neck supple. No thyromegaly present.  Cardiovascular: Normal rate and regular rhythm.   Pulmonary/Chest: Breath sounds normal. No respiratory distress. She has no wheezes.  Abdominal: Soft. Bowel sounds are normal. There is no tenderness.  Genitourinary:  Genitourinary Comments: Normal external vaginal genitalia.  Vaginal vault without lesions.  Some  discharge present.  Could not appreciate any adnexal masses or tenderness.   Erythematous open tissue - just superior to the anal opening.  Some erythema in the perirectal region.    Musculoskeletal: She exhibits no edema or tenderness.  Lymphadenopathy:    She has no cervical adenopathy.  Skin: No rash noted. No erythema.  Psychiatric: She has a normal mood and affect. Her behavior is normal.    BP 130/82 (BP Location: Left Arm, Patient Position: Sitting, Cuff Size: Large)   Pulse 68   Temp 98.6 F (37 C) (  Oral)   Resp 12   Ht '5\' 5"'$  (1.651 m)   Wt 220 lb 6.4 oz (100 kg)   SpO2 96%   BMI 36.68 kg/m  Wt Readings from Last 3 Encounters:  05/31/17 220 lb 6.4 oz (100 kg)  04/25/17 218 lb 9.6 oz (99.2 kg)  02/18/17 221 lb 12.8 oz (100.6 kg)     Lab Results  Component Value Date   WBC 5.8 08/21/2016   HGB 13.6 08/21/2016   HCT 40.4 08/21/2016   PLT 210.0 08/21/2016   GLUCOSE 138 (H) 05/29/2017   CHOL 158 05/29/2017   TRIG 100.0 05/29/2017   HDL 66.10 05/29/2017   LDLDIRECT 146.5 09/19/2012   LDLCALC 72 05/29/2017   ALT 11 05/29/2017   AST 15 05/29/2017   NA 141 05/29/2017   K 4.1 05/29/2017   CL 104 05/29/2017   CREATININE 0.88 05/29/2017   BUN 17 05/29/2017   CO2 30 05/29/2017   TSH 2.66 08/21/2016   INR 1.0 11/22/2015   HGBA1C 6.0 05/29/2017   MICROALBUR 1.2 02/14/2017       Assessment & Plan:   Problem List Items Addressed This Visit    Atrophic vaginitis    Does have some atrophy.  Also with skin changes around the rectum.  Did not respond to nystatin.  Refer to gyn for evaluation.  Raw area just above rectum - will have them evaluate as well.       Relevant Orders   Ambulatory referral to Gynecology   Diabetes mellitus type 2, diet-controlled ([place])    Low carb diet and exercise.  Follow met b and a1c.   Lab Results  Component Value Date   HGBA1C 6.0 05/29/2017        Relevant Orders   Hemoglobin S8N   Basic metabolic panel   Diaphoresis     Persistent intermittent diaphoresis.  Recent labs ok.  EKG - SR with no acute ischemic changes.  Occurs with increased exertion.  Some fatigue, etc associated.  Will refer to cardiology for further evaluation with question of need for further w/up.       Relevant Orders   Ambulatory referral to Cardiology   Dysuria    Has seen urology previously.  Was using estrace and prn pyridium.  Now with recurring dysuria.  Check urin to confirm no infection.  KOH/wet prep obtained.  May need gyn evaluation.        Relevant Orders   Urinalysis, Routine w reflex microscopic (Completed)   Urine Culture (Completed)   Cervicovaginal ancillary only   Hypercholesterolemia    On simvastatin.  Low cholesterol diet and exercise.  Follow lipid panel and liver function tests.        Relevant Orders   Lipid panel   Hepatic function panel   Hypertension    Blood pressure under good control.  Continue same medication regimen.  Follow pressures.  Follow metabolic panel.        Relevant Orders   CBC with Differential/Platelet   TSH   Spondylolisthesis at L5-S1 level    Was seeing Dr Ellene Route.  Now just seeing on an as needed basis.  On tramadol.  Request to have refilled here.  No longer taking narcotic medications.         Other Visit Diagnoses    Rectal irritation    -  Primary   Relevant Orders   Ambulatory referral to Gynecology   Sweating abnormality       Relevant Orders  EKG 12-Lead (Completed)   Ambulatory referral to Cardiology   Other fatigue       Relevant Orders   EKG 12-Lead (Completed)   Ambulatory referral to Cardiology     I spent 40 minutes with the patient and more than 50% of the time was spent in consultation regarding the above.  Time spent discussing her chronic medical issues as well as her acute issues.  Also discussed plan for evaluation and further w/up.    [name], MD

## 2017-06-01 LAB — URINE CULTURE: Organism ID, Bacteria: NO GROWTH

## 2017-06-02 ENCOUNTER — Encounter: Payer: Self-pay | Admitting: Internal Medicine

## 2017-06-02 NOTE — Assessment & Plan Note (Signed)
Does have some atrophy.  Also with skin changes around the rectum.  Did not respond to nystatin.  Refer to gyn for evaluation.  Raw area just above rectum - will have them evaluate as well.

## 2017-06-02 NOTE — Assessment & Plan Note (Signed)
Was seeing Dr Ellene Route.  Now just seeing on an as needed basis.  On tramadol.  Request to have refilled here.  No longer taking narcotic medications.

## 2017-06-02 NOTE — Assessment & Plan Note (Signed)
Low carb diet and exercise.  Follow met b and a1c.   Lab Results  Component Value Date   HGBA1C 6.0 05/29/2017

## 2017-06-02 NOTE — Assessment & Plan Note (Signed)
Persistent intermittent diaphoresis.  Recent labs ok.  EKG - SR with no acute ischemic changes.  Occurs with increased exertion.  Some fatigue, etc associated.  Will refer to cardiology for further evaluation with question of need for further w/up.

## 2017-06-02 NOTE — Assessment & Plan Note (Signed)
Blood pressure under good control.  Continue same medication regimen.  Follow pressures.  Follow metabolic panel.   

## 2017-06-02 NOTE — Assessment & Plan Note (Signed)
On simvastatin.  Low cholesterol diet and exercise.  Follow lipid panel and liver function tests.   

## 2017-06-02 NOTE — Assessment & Plan Note (Signed)
Has seen urology previously.  Was using estrace and prn pyridium.  Now with recurring dysuria.  Check urin to confirm no infection.  KOH/wet prep obtained.  May need gyn evaluation.

## 2017-06-04 ENCOUNTER — Encounter: Payer: Self-pay | Admitting: Internal Medicine

## 2017-06-04 DIAGNOSIS — J301 Allergic rhinitis due to pollen: Secondary | ICD-10-CM | POA: Diagnosis not present

## 2017-06-04 LAB — CERVICOVAGINAL ANCILLARY ONLY: Wet Prep (BD Affirm): NEGATIVE

## 2017-06-05 DIAGNOSIS — L570 Actinic keratosis: Secondary | ICD-10-CM | POA: Diagnosis not present

## 2017-06-05 DIAGNOSIS — L720 Epidermal cyst: Secondary | ICD-10-CM | POA: Diagnosis not present

## 2017-06-05 DIAGNOSIS — L821 Other seborrheic keratosis: Secondary | ICD-10-CM | POA: Diagnosis not present

## 2017-06-05 DIAGNOSIS — L918 Other hypertrophic disorders of the skin: Secondary | ICD-10-CM | POA: Diagnosis not present

## 2017-06-11 DIAGNOSIS — J301 Allergic rhinitis due to pollen: Secondary | ICD-10-CM | POA: Diagnosis not present

## 2017-06-17 DIAGNOSIS — J301 Allergic rhinitis due to pollen: Secondary | ICD-10-CM | POA: Diagnosis not present

## 2017-06-25 DIAGNOSIS — J301 Allergic rhinitis due to pollen: Secondary | ICD-10-CM | POA: Diagnosis not present

## 2017-06-27 DIAGNOSIS — J301 Allergic rhinitis due to pollen: Secondary | ICD-10-CM | POA: Diagnosis not present

## 2017-07-01 DIAGNOSIS — J301 Allergic rhinitis due to pollen: Secondary | ICD-10-CM | POA: Diagnosis not present

## 2017-07-05 DIAGNOSIS — R300 Dysuria: Secondary | ICD-10-CM | POA: Diagnosis not present

## 2017-07-05 DIAGNOSIS — N760 Acute vaginitis: Secondary | ICD-10-CM | POA: Diagnosis not present

## 2017-07-08 DIAGNOSIS — J301 Allergic rhinitis due to pollen: Secondary | ICD-10-CM | POA: Diagnosis not present

## 2017-07-17 DIAGNOSIS — J301 Allergic rhinitis due to pollen: Secondary | ICD-10-CM | POA: Diagnosis not present

## 2017-07-23 DIAGNOSIS — J301 Allergic rhinitis due to pollen: Secondary | ICD-10-CM | POA: Diagnosis not present

## 2017-07-25 DIAGNOSIS — E119 Type 2 diabetes mellitus without complications: Secondary | ICD-10-CM | POA: Diagnosis not present

## 2017-07-25 DIAGNOSIS — Z1211 Encounter for screening for malignant neoplasm of colon: Secondary | ICD-10-CM | POA: Diagnosis not present

## 2017-07-26 DIAGNOSIS — N9089 Other specified noninflammatory disorders of vulva and perineum: Secondary | ICD-10-CM | POA: Diagnosis not present

## 2017-07-26 DIAGNOSIS — N760 Acute vaginitis: Secondary | ICD-10-CM | POA: Diagnosis not present

## 2017-07-29 DIAGNOSIS — J301 Allergic rhinitis due to pollen: Secondary | ICD-10-CM | POA: Diagnosis not present

## 2017-07-30 NOTE — Progress Notes (Signed)
[facility] Office Note  Date:  07/31/2017   ID:  MACENZIE BURFORD, DOB October 31, 1946, MRN [id number]  PCP:  [name], MD   Chief Complaint  Patient presents with  . OTHER    Sob with exertion and unusual sweating only coming from the head area. Meds reviewed verbally with pt.    HPI:  71 year old woman with history of Hypertension Hyperlipidemia Borderline diabetes Non smoker, GERD Anxiety obesity Total hip replacement, 2013 Chronic low back pain 2015, 2017 History of bariatric surgery, LAP band, lost 70 pounds Presents by referral from Dr. Nicki Reaper for evaluation of shortness of breath with exertion  In follow-up she reports that she is sedentary, no regular exercise program Previously used to do water aerobics, water walking Has not done any water exercise for many months since her last back surgery 2017  Does her ADLs If she exerts herself, has some shortness of breath, Bothered by sweating around her head with exertion, worse in the heat Sweating has been going for 2 years, getting worse she feels Exacerbated by hot humid summer  Denies any chest pain on exertion, no shoulder pain, neck pain, back pain  Feels weight has been stable Lost 70 pounds after gastric bypass surgery  Previous imaging studies reviewed with her in detail, images pulled up in the office CT scan lumbar spine Atherosclerotic calcification in the aorta and iliacs without aneurysm. Very mild  Carotid ultrasound July 2018 Less than 39% bilaterally  EKG personally reviewed by myself on todays visit Shows normal sinus rhythm rate 60 bpm left anterior fascicular block  PMH:   has a past medical history of Allergy; Anxiety; Arthritis; Chronic back pain; Diabetes mellitus; GERD (gastroesophageal reflux disease); Hyperlipidemia; Hypertension; Pneumonia; Spinal stenosis; and UTI (lower urinary tract infection).  PSH:    Past Surgical History:  Procedure Laterality Date  . ABDOMINAL HYSTERECTOMY   1973  . ABDOMINAL HYSTERECTOMY    . APPENDECTOMY    . BACK SURGERY  04/17/13  . CHOLECYSTECTOMY  1995  . HERNIA REPAIR  08/2006  . lap band  08/2003  . SEPTOPLASTY  04/1978  . TONSILLECTOMY AND ADENOIDECTOMY  1979  . TOTAL HIP ARTHROPLASTY  02/2012   Right, Dr. Marry Guan    Current Outpatient Prescriptions  Medication Sig Dispense Refill  . albuterol (PROVENTIL) (2.5 MG/3ML) 0.083% nebulizer solution Take 3 mLs (2.5 mg total) by nebulization every 6 (six) hours as needed for wheezing. Please disp with nebulizer machine 150 mL 1  . ALPRAZolam (XANAX) 0.25 MG tablet Take 1 tablet (0.25 mg total) by mouth daily as needed for anxiety. 30 tablet 0  . Cholecalciferol (VITAMIN D-3) 1000 UNITS CAPS Take 1 capsule by mouth daily.    . cyclobenzaprine (FLEXERIL) 5 MG tablet Take 1 tablet (5 mg total) by mouth 3 (three) times daily as needed for muscle spasms. 30 tablet 1  . fexofenadine (ALLEGRA) 180 MG tablet Take 1 tablet (180 mg total) by mouth daily. 90 tablet 4  . fluticasone (FLONASE) 50 MCG/ACT nasal spray USE TWO SPRAYS IN EACH NOSTRIL EACH DAY AS DIRECTED BY PHYSICIAN 48 g 1  . HYDROcodone-acetaminophen (NORCO/VICODIN) 5-325 MG tablet Take 1 tablet by mouth daily.    Marland Kitchen losartan-hydrochlorothiazide (HYZAAR) 50-12.5 MG tablet TAKE ONE (1) TABLET EACH DAY 90 tablet 2  . metoprolol succinate (TOPROL-XL) 25 MG 24 hr tablet TAKE ONE (1) TABLET EACH DAY 90 tablet 1  . Multiple Vitamin (MULTIVITAMIN) tablet Take 1 tablet by mouth daily.    Marland Kitchen oseltamivir (TAMIFLU)  75 MG capsule Take 1 capsule (75 mg total) by mouth 2 (two) times daily. 10 capsule 0  . sertraline (ZOLOFT) 100 MG tablet Take 0.5 tablets (50 mg total) by mouth daily. 45 tablet 2  . simvastatin (ZOCOR) 10 MG tablet TAKE ONE-HALF TABLET (5 MG TOTAL) BY MOUTH AT BEDTIME. 90 tablet 2  . traMADol (ULTRAM) 50 MG tablet Take 1 tablet (50 mg total) by mouth 3 (three) times daily as needed. 90 tablet 1  . traZODone (DESYREL) 100 MG tablet TAKE  ONE (1) TABLET AT BEDTIME 90 tablet 0   No current facility-administered medications for this visit.      Allergies:   Patient has no known allergies.   Social History:  The patient  reports that she has never smoked. She has never used smokeless tobacco. She reports that she drinks about 0.6 oz of alcohol per week . She reports that she does not use drugs.   Family History:   family history includes Arthritis in her sister; Breast cancer in her sister; Breast cancer (age of onset: 68) in her other; Cancer in her other; Dementia in her sister; Diabetes in her brother, brother, other, and sister; Heart disease in her father and mother; Hypertension in her brother, brother, father, and mother; Stroke in her father and mother.    Review of Systems: Review of Systems  Constitutional: Positive for diaphoresis.  Respiratory: Positive for shortness of breath.   Cardiovascular: Negative.   Gastrointestinal: Negative.   Musculoskeletal: Negative.   Neurological: Negative.   Psychiatric/Behavioral: Negative.   All other systems reviewed and are negative.    PHYSICAL EXAM: VS:  BP 131/84 (BP Location: Right Arm, Patient Position: Sitting, Cuff Size: Large)   Pulse 60   Ht 5\' 5"  (1.651 m)   Wt 216 lb 8 oz (98.2 kg)   BMI 36.03 kg/m  , BMI Body mass index is 36.03 kg/m. GEN: Well nourished, well developed, in no acute distress, obese  HEENT: normal  Neck: no JVD, carotid bruits, or masses Cardiac: RRR; no murmurs, rubs, or gallops,no edema  Respiratory:  clear to auscultation bilaterally, normal work of breathing GI: soft, nontender, nondistended, + BS MS: no deformity or atrophy  Skin: warm and dry, no rash Neuro:  Strength and sensation are intact Psych: euthymic mood, full affect    Recent Labs: 08/21/2016: Hemoglobin 13.6; Platelets 210.0; TSH 2.66 05/29/2017: ALT 11; BUN 17; Creatinine, Ser 0.88; Potassium 4.1; Sodium 141    Lipid Panel Lab Results  Component Value Date    CHOL 158 05/29/2017   HDL 66.10 05/29/2017   LDLCALC 72 05/29/2017   TRIG 100.0 05/29/2017      Wt Readings from Last 3 Encounters:  07/31/17 216 lb 8 oz (98.2 kg)  05/31/17 220 lb 6.4 oz (100 kg)  04/25/17 218 lb 9.6 oz (99.2 kg)       ASSESSMENT AND PLAN:  Aortic atherosclerosis (HCC) - Plan: EKG 12-Lead CT scan images reviewed with her in the office, minimal aortic atherosclerosis noted  Shortness of breath - Plan: EKG 12-Lead Suspect shortness of breath is secondary to deconditioning, obesity Given minimal aortic atherosclerosis, minimal carotid calcification, atypical presentation, less likely underlying ischemia Suggested we start with echocardiogram to evaluate cardiac function, right heart pressures, valve disease Long discussion concerning starting exercise program for conditioning and weight loss 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 She does have a friend who will go with her to do water walking. Recommended she try to  do this 3-5 times per week If shortness of breath symptoms get worse or if she develops chest tightness with exertion, we would order a stress test. Recommended she call us if this happens We did discuss types of stress testing including nuclear stress test  Essential hypertension - Plan: EKG 12-Lead Blood pressure is well controlled on today's visit. No changes made to the medications.  Hypercholesterolemia Currently on very low-dose simvastatin, cholesterol reasonable If she would like to be more aggressive we could increase the dose To push LDL well below 70 For now recommended she pursue last modification  Diabetes mellitus type 2, diet-controlled ([place]) Diabetes numbers reviewed with her, previously hemoglobin A1c 6.6 which has improved now down to 6.0. Recommended low carbohydrate diet, exercise, weight loss  Obesity (BMI 30-39.9) We have encouraged continued exercise, careful diet management in an effort to lose weight.   Disposition:    F/U  As needed Long discussion concerning above  Total encounter time more than 60 minutes  Greater than 50% was spent in counseling and coordination of care with the patient    Orders Placed This Encounter  Procedures  . EKG 12-Lead     Signed, Esmond Plants, M.D., Ph.D. 07/31/2017  [place], [place]

## 2017-07-31 ENCOUNTER — Ambulatory Visit (INDEPENDENT_AMBULATORY_CARE_PROVIDER_SITE_OTHER): Payer: PPO | Admitting: Cardiovascular Disease

## 2017-07-31 ENCOUNTER — Encounter: Payer: Self-pay | Admitting: Cardiovascular Disease

## 2017-07-31 VITALS — BP 131/84 | HR 60 | Ht 65.0 in | Wt 216.5 lb

## 2017-07-31 DIAGNOSIS — E7800 Pure hypercholesterolemia, unspecified: Secondary | ICD-10-CM | POA: Diagnosis not present

## 2017-07-31 DIAGNOSIS — E119 Type 2 diabetes mellitus without complications: Secondary | ICD-10-CM | POA: Diagnosis not present

## 2017-07-31 DIAGNOSIS — I10 Essential (primary) hypertension: Secondary | ICD-10-CM | POA: Diagnosis not present

## 2017-07-31 DIAGNOSIS — I700 Atherosclerosis of aorta: Secondary | ICD-10-CM | POA: Diagnosis not present

## 2017-07-31 DIAGNOSIS — R0602 Shortness of breath: Secondary | ICD-10-CM

## 2017-07-31 DIAGNOSIS — E669 Obesity, unspecified: Secondary | ICD-10-CM

## 2017-07-31 NOTE — Patient Instructions (Signed)
Medication Instructions:   No medication changes made  Labwork:  No new labs needed  Testing/Procedures:  We will order a echocardiogram for shortness of breath   Follow-Up: It was a pleasure seeing you in the office today. Please call us if you have new issues that need to be addressed before your next appt.  [phone]  Your physician wants you to follow-up in:  As needed  If you need a refill on your cardiac medications before your next appointment, please call your pharmacy.

## 2017-08-05 ENCOUNTER — Other Ambulatory Visit: Payer: Self-pay

## 2017-08-05 ENCOUNTER — Ambulatory Visit (INDEPENDENT_AMBULATORY_CARE_PROVIDER_SITE_OTHER): Payer: PPO

## 2017-08-05 DIAGNOSIS — J301 Allergic rhinitis due to pollen: Secondary | ICD-10-CM | POA: Diagnosis not present

## 2017-08-05 DIAGNOSIS — Z1211 Encounter for screening for malignant neoplasm of colon: Secondary | ICD-10-CM | POA: Diagnosis not present

## 2017-08-05 DIAGNOSIS — R0602 Shortness of breath: Secondary | ICD-10-CM | POA: Diagnosis not present

## 2017-08-05 DIAGNOSIS — Z1212 Encounter for screening for malignant neoplasm of rectum: Secondary | ICD-10-CM | POA: Diagnosis not present

## 2017-08-06 ENCOUNTER — Telehealth: Payer: Self-pay | Admitting: *Deleted

## 2017-08-06 NOTE — Telephone Encounter (Signed)
Reviewed results and recommendations with patient and instructed her to cut the metoprolol in half. She verbalized understanding and was very appreciative for the call. Instructed her to please give me a call back if she has any further questions.

## 2017-08-06 NOTE — Telephone Encounter (Signed)
-----   Message from Minna Merritts, MD sent at 08/05/2017  5:59 PM EDT ----- Echocardiogram Normal ejection fraction Normal right heart pressures, no significant valve disease Overall good study Heart rate was low, consider cutting metoprolol in half daily

## 2017-08-11 ENCOUNTER — Other Ambulatory Visit: Payer: Self-pay | Admitting: Internal Medicine

## 2017-08-12 DIAGNOSIS — J301 Allergic rhinitis due to pollen: Secondary | ICD-10-CM | POA: Diagnosis not present

## 2017-08-14 DIAGNOSIS — L900 Lichen sclerosus et atrophicus: Secondary | ICD-10-CM | POA: Diagnosis not present

## 2017-08-14 DIAGNOSIS — K659 Peritonitis, unspecified: Secondary | ICD-10-CM | POA: Diagnosis not present

## 2017-08-14 DIAGNOSIS — N9089 Other specified noninflammatory disorders of vulva and perineum: Secondary | ICD-10-CM | POA: Diagnosis not present

## 2017-08-19 DIAGNOSIS — J301 Allergic rhinitis due to pollen: Secondary | ICD-10-CM | POA: Diagnosis not present

## 2017-08-26 DIAGNOSIS — M19071 Primary osteoarthritis, right ankle and foot: Secondary | ICD-10-CM | POA: Diagnosis not present

## 2017-08-26 DIAGNOSIS — J301 Allergic rhinitis due to pollen: Secondary | ICD-10-CM | POA: Diagnosis not present

## 2017-08-26 DIAGNOSIS — M79671 Pain in right foot: Secondary | ICD-10-CM | POA: Diagnosis not present

## 2017-08-28 ENCOUNTER — Other Ambulatory Visit (INDEPENDENT_AMBULATORY_CARE_PROVIDER_SITE_OTHER): Payer: PPO

## 2017-08-28 DIAGNOSIS — E119 Type 2 diabetes mellitus without complications: Secondary | ICD-10-CM | POA: Diagnosis not present

## 2017-08-28 DIAGNOSIS — E7800 Pure hypercholesterolemia, unspecified: Secondary | ICD-10-CM | POA: Diagnosis not present

## 2017-08-28 DIAGNOSIS — I10 Essential (primary) hypertension: Secondary | ICD-10-CM | POA: Diagnosis not present

## 2017-08-28 LAB — CBC WITH DIFFERENTIAL/PLATELET
Basophils Absolute: 0 10*3/uL (ref 0.0–0.1)
Basophils Relative: 0.9 % (ref 0.0–3.0)
Eosinophils Absolute: 0.1 10*3/uL (ref 0.0–0.7)
Eosinophils Relative: 3.1 % (ref 0.0–5.0)
HCT: 39.4 % (ref 36.0–46.0)
Hemoglobin: 13 g/dL (ref 12.0–15.0)
Lymphocytes Relative: 30.8 % (ref 12.0–46.0)
Lymphs Abs: 1.4 10*3/uL (ref 0.7–4.0)
MCHC: 33 g/dL (ref 30.0–36.0)
MCV: 90.8 fl (ref 78.0–100.0)
Monocytes Absolute: 0.5 10*3/uL (ref 0.1–1.0)
Monocytes Relative: 10.6 % (ref 3.0–12.0)
Neutro Abs: 2.5 10*3/uL (ref 1.4–7.7)
Neutrophils Relative %: 54.6 % (ref 43.0–77.0)
Platelets: 192 10*3/uL (ref 150.0–400.0)
RBC: 4.34 Mil/uL (ref 3.87–5.11)
RDW: 14.2 % (ref 11.5–15.5)
WBC: 4.7 10*3/uL (ref 4.0–10.5)

## 2017-08-28 LAB — LIPID PANEL
Cholesterol: 171 mg/dL (ref 0–200)
HDL: 56.7 mg/dL
LDL Cholesterol: 91 mg/dL (ref 0–99)
NonHDL: 114.68
Total CHOL/HDL Ratio: 3
Triglycerides: 116 mg/dL (ref 0.0–149.0)
VLDL: 23.2 mg/dL (ref 0.0–40.0)

## 2017-08-28 LAB — HEPATIC FUNCTION PANEL
ALT: 10 U/L (ref 0–35)
AST: 14 U/L (ref 0–37)
Albumin: 4.2 g/dL (ref 3.5–5.2)
Alkaline Phosphatase: 77 U/L (ref 39–117)
Bilirubin, Direct: 0.1 mg/dL (ref 0.0–0.3)
Total Bilirubin: 0.7 mg/dL (ref 0.2–1.2)
Total Protein: 6.9 g/dL (ref 6.0–8.3)

## 2017-08-28 LAB — BASIC METABOLIC PANEL WITH GFR
BUN: 18 mg/dL (ref 6–23)
CO2: 30 meq/L (ref 19–32)
Calcium: 10.1 mg/dL (ref 8.4–10.5)
Chloride: 103 meq/L (ref 96–112)
Creatinine, Ser: 0.91 mg/dL (ref 0.40–1.20)
GFR: 64.53 mL/min
Glucose, Bld: 136 mg/dL — ABNORMAL HIGH (ref 70–99)
Potassium: 4.2 meq/L (ref 3.5–5.1)
Sodium: 140 meq/L (ref 135–145)

## 2017-08-28 LAB — TSH: TSH: 5.43 u[IU]/mL — ABNORMAL HIGH (ref 0.35–4.50)

## 2017-08-28 LAB — HEMOGLOBIN A1C: Hgb A1c MFr Bld: 6.3 % (ref 4.6–6.5)

## 2017-08-29 ENCOUNTER — Encounter: Payer: Self-pay | Admitting: Internal Medicine

## 2017-08-29 ENCOUNTER — Other Ambulatory Visit: Payer: PPO

## 2017-09-02 ENCOUNTER — Encounter: Payer: Self-pay | Admitting: Internal Medicine

## 2017-09-02 ENCOUNTER — Ambulatory Visit (INDEPENDENT_AMBULATORY_CARE_PROVIDER_SITE_OTHER): Payer: PPO | Admitting: Internal Medicine

## 2017-09-02 DIAGNOSIS — Z9889 Other specified postprocedural states: Secondary | ICD-10-CM

## 2017-09-02 DIAGNOSIS — R7989 Other specified abnormal findings of blood chemistry: Secondary | ICD-10-CM | POA: Diagnosis not present

## 2017-09-02 DIAGNOSIS — J301 Allergic rhinitis due to pollen: Secondary | ICD-10-CM | POA: Diagnosis not present

## 2017-09-02 DIAGNOSIS — E7800 Pure hypercholesterolemia, unspecified: Secondary | ICD-10-CM | POA: Diagnosis not present

## 2017-09-02 DIAGNOSIS — Z23 Encounter for immunization: Secondary | ICD-10-CM

## 2017-09-02 DIAGNOSIS — E669 Obesity, unspecified: Secondary | ICD-10-CM

## 2017-09-02 DIAGNOSIS — I10 Essential (primary) hypertension: Secondary | ICD-10-CM

## 2017-09-02 DIAGNOSIS — Z1231 Encounter for screening mammogram for malignant neoplasm of breast: Secondary | ICD-10-CM | POA: Diagnosis not present

## 2017-09-02 DIAGNOSIS — E119 Type 2 diabetes mellitus without complications: Secondary | ICD-10-CM

## 2017-09-02 DIAGNOSIS — Z1239 Encounter for other screening for malignant neoplasm of breast: Secondary | ICD-10-CM

## 2017-09-02 NOTE — Progress Notes (Signed)
Patient ID: JOYEL CHENETTE, female   DOB: Mar 23, 1946, 71 y.o.   MRN: [id number]   Subjective:    Patient ID: [name], female    DOB: Aug 17, 1946, 71 y.o.   MRN: [id number]  HPI  Patient here for a scheduled follow up.  She has been having pain in her right foot.  Saw podiatry.  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  Placed on meloxicam.  Is helping.  Recommended f/u 6 weeks after appt.  She also saw GI for vulvar irritation.  Diagnosed with lichen sclerosis.  Using clobetasol.  Saw Dr Rockey Situ 07/31/17.  Planning for echo.  Overall stable.  Discussed recent labs.  No chest pain.  No sob.  No abdominal pain.  Bowels moving.     Past Medical History:  Diagnosis Date  . Allergy    hay fever  . Anxiety   . Arthritis   . Chronic back pain   . Diabetes mellitus    no meds      pt states she is not diabetic  . GERD (gastroesophageal reflux disease)   . Hyperlipidemia   . Hypertension   . Pneumonia   . Spinal stenosis   . UTI (lower urinary tract infection)    Past Surgical History:  Procedure Laterality Date  . ABDOMINAL HYSTERECTOMY  1973  . ABDOMINAL HYSTERECTOMY    . APPENDECTOMY    . BACK SURGERY  04/17/13  . CHOLECYSTECTOMY  1995  . HERNIA REPAIR  08/2006  . lap band  08/2003  . SEPTOPLASTY  04/1978  . TONSILLECTOMY AND ADENOIDECTOMY  1979  . TOTAL HIP ARTHROPLASTY  02/2012   Right, Dr. Marry Guan   Family History  Problem Relation Age of Onset  . Hypertension Mother   . Stroke Mother   . Heart disease Mother   . Hypertension Father   . Stroke Father   . Heart disease Father   . Arthritis Sister   . Diabetes Sister   . Dementia Sister   . Breast cancer Sister   . Diabetes Brother   . Hypertension Brother   . Diabetes Brother   . Hypertension Brother   . Breast cancer Other 47  . Cancer Other        breast  . Diabetes Other   . Kidney disease Neg Hx   . Bladder Cancer Neg Hx    Social History   Social History  . Marital status: Married    Spouse name: N/A  .  Number of children: N/A  . Years of education: N/A   Social History Main Topics  . Smoking status: Never Smoker  . Smokeless tobacco: Never Used  . Alcohol use 0.6 oz/week    1 Glasses of wine per week     Comment: occ  . Drug use: No  . Sexual activity: No   Other Topics Concern  . None   Social History Narrative   Lives in [place] with husband.      Work - retired Psychologist, clinical Prescriptions as of 09/02/2017  Medication Sig  . albuterol (PROVENTIL) (2.5 MG/3ML) 0.083% nebulizer solution Take 3 mLs (2.5 mg total) by nebulization every 6 (six) hours as needed for wheezing. Please disp with nebulizer machine  . ALPRAZolam (XANAX) 0.25 MG tablet Take 1 tablet (0.25 mg total) by mouth daily as needed for anxiety.  . Cholecalciferol (VITAMIN D-3) 1000 UNITS CAPS Take 1 capsule by mouth daily.  . cyclobenzaprine (FLEXERIL) 5  MG tablet Take 1 tablet (5 mg total) by mouth 3 (three) times daily as needed for muscle spasms.  . fexofenadine (ALLEGRA) 180 MG tablet Take 1 tablet (180 mg total) by mouth daily.  . fluticasone (FLONASE) 50 MCG/ACT nasal spray USE TWO SPRAYS IN EACH NOSTRIL EACH DAY AS DIRECTED BY PHYSICIAN  . losartan-hydrochlorothiazide (HYZAAR) 50-12.5 MG tablet TAKE ONE (1) TABLET EACH DAY  . meloxicam (MOBIC) 7.5 MG tablet Take 7.5 mg by mouth daily.  . metoprolol succinate (TOPROL-XL) 25 MG 24 hr tablet TAKE ONE (1) TABLET EACH DAY  . Multiple Vitamin (MULTIVITAMIN) tablet Take 1 tablet by mouth daily.  . sertraline (ZOLOFT) 100 MG tablet Take 0.5 tablets (50 mg total) by mouth daily.  . simvastatin (ZOCOR) 10 MG tablet TAKE ONE-HALF TABLET (5 MG TOTAL) BY MOUTH AT BEDTIME.  . traMADol (ULTRAM) 50 MG tablet Take 1 tablet (50 mg total) by mouth 3 (three) times daily as needed.  . traZODone (DESYREL) 100 MG tablet TAKE ONE (1) TABLET AT BEDTIME  . [DISCONTINUED] oseltamivir (TAMIFLU) 75 MG capsule Take 1 capsule (75 mg total) by mouth 2 (two)  times daily.   No facility-administered encounter medications on file as of 09/02/2017.     Review of Systems  Constitutional: Negative for appetite change and unexpected weight change.  HENT: Negative for congestion and sinus pressure.   Respiratory: Negative for cough, chest tightness and shortness of breath.   Cardiovascular: Negative for chest pain, palpitations and leg swelling.  Gastrointestinal: Negative for abdominal pain, diarrhea, nausea and vomiting.  Genitourinary: Negative for difficulty urinating and dysuria.  Musculoskeletal:       Back is doing better.  Foot pain as outlined.    Skin: Negative for color change and rash.  Neurological: Negative for dizziness, light-headedness and headaches.  Psychiatric/Behavioral: Negative for agitation and dysphoric mood.       Objective:    Physical Exam  Constitutional: She appears well-developed and well-nourished. No distress.  HENT:  Nose: Nose normal.  Mouth/Throat: Oropharynx is clear and moist.  Neck: Neck supple. No thyromegaly present.  Cardiovascular: Normal rate and regular rhythm.   Pulmonary/Chest: Breath sounds normal. No respiratory distress. She has no wheezes.  Abdominal: Soft. Bowel sounds are normal. There is no tenderness.  Musculoskeletal: She exhibits no edema or tenderness.  Lymphadenopathy:    She has no cervical adenopathy.  Neurological: She is alert.  Skin: No rash noted. No erythema.  Psychiatric: She has a normal mood and affect.    BP 132/82 (BP Location: Left Arm, Patient Position: Sitting, Cuff Size: Large)   Pulse 67   Temp 99 F (37.2 C) (Oral)   Resp 14   Ht '5\' 5"'$  (1.651 m)   Wt 220 lb 6.4 oz (100 kg)   SpO2 97%   BMI 36.68 kg/m  Wt Readings from Last 3 Encounters:  09/02/17 220 lb 6.4 oz (100 kg)  07/31/17 216 lb 8 oz (98.2 kg)  05/31/17 220 lb 6.4 oz (100 kg)     Lab Results  Component Value Date   WBC 4.7 08/28/2017   HGB 13.0 08/28/2017   HCT 39.4 08/28/2017   PLT  192.0 08/28/2017   GLUCOSE 136 (H) 08/28/2017   CHOL 171 08/28/2017   TRIG 116.0 08/28/2017   HDL 56.70 08/28/2017   LDLDIRECT 146.5 09/19/2012   LDLCALC 91 08/28/2017   ALT 10 08/28/2017   AST 14 08/28/2017   NA 140 08/28/2017   K 4.2 08/28/2017   CL  103 08/28/2017   CREATININE 0.91 08/28/2017   BUN 18 08/28/2017   CO2 30 08/28/2017   TSH 5.43 (H) 08/28/2017   INR 1.0 11/22/2015   HGBA1C 6.3 08/28/2017   MICROALBUR 1.2 02/14/2017       Assessment & Plan:   Problem List Items Addressed This Visit    Diabetes mellitus type 2, diet-controlled ([place])    Discussed low carb diet and exercise.  Follow met b and a1c.        Hypercholesterolemia    On simvastatin.  Low cholesterol diet and exercise.  Follow lipid panel and liver function tests.        Hypertension    Blood pressure under good control.  Continue same medication regimen.  Follow pressures.  Follow metabolic panel.        Obesity (BMI 30-39.9)    Discussed diet and exercise.        Status post lumbar spine operation    Back is doing better.         Other Visit Diagnoses    Screening for breast cancer       Relevant Orders   MM DIGITAL SCREENING BILATERAL   Encounter for immunization       Relevant Medications   meloxicam (MOBIC) 7.5 MG tablet   Other Relevant Orders   MM DIGITAL SCREENING BILATERAL   Flu vaccine HIGH DOSE PF (Completed)   Elevated TSH       noted on recent labs.  recheck tsh.    Relevant Orders   TSH       [name], MD

## 2017-09-04 ENCOUNTER — Encounter: Payer: Self-pay | Admitting: Internal Medicine

## 2017-09-04 NOTE — Assessment & Plan Note (Signed)
On simvastatin.  Low cholesterol diet and exercise.  Follow lipid panel and liver function tests.   

## 2017-09-04 NOTE — Assessment & Plan Note (Signed)
Blood pressure under good control.  Continue same medication regimen.  Follow pressures.  Follow metabolic panel.   

## 2017-09-04 NOTE — Assessment & Plan Note (Signed)
Discussed low carb diet and exercise.  Follow met b and a1c.   

## 2017-09-04 NOTE — Assessment & Plan Note (Signed)
Back is doing better.

## 2017-09-04 NOTE — Assessment & Plan Note (Signed)
Discussed diet and exercise 

## 2017-09-05 ENCOUNTER — Other Ambulatory Visit: Payer: Self-pay

## 2017-09-05 MED ORDER — FLUTICASONE PROPIONATE 50 MCG/ACT NA SUSP: 2.0000 | Freq: Every day | NASAL | 0 refills | Status: DC

## 2017-09-16 DIAGNOSIS — J301 Allergic rhinitis due to pollen: Secondary | ICD-10-CM | POA: Diagnosis not present

## 2017-09-23 ENCOUNTER — Encounter: Payer: Self-pay | Admitting: Internal Medicine

## 2017-09-23 DIAGNOSIS — J301 Allergic rhinitis due to pollen: Secondary | ICD-10-CM | POA: Diagnosis not present

## 2017-09-24 MED ORDER — TRAMADOL HCL 50 MG PO TABS: 50.0000 mg | ORAL_TABLET | Freq: Three times a day (TID) | ORAL | 1 refills | Status: DC | PRN

## 2017-09-24 NOTE — Telephone Encounter (Signed)
I sent in refill for tramadol (#90 with one refill) to [facility].  This was the local pharmacy marked.  Please let pt know.  (there was another local pharmacy listed).

## 2017-09-26 ENCOUNTER — Ambulatory Visit: Payer: PPO

## 2017-09-30 DIAGNOSIS — J301 Allergic rhinitis due to pollen: Secondary | ICD-10-CM | POA: Diagnosis not present

## 2017-10-01 ENCOUNTER — Ambulatory Visit
Admission: RE | Admit: 2017-10-01 | Discharge: 2017-10-01 | Disposition: A | Discharge status: Home / Self Care | Payer: PPO | Source: Ambulatory Visit | Attending: Internal Medicine | Admitting: Internal Medicine

## 2017-10-01 DIAGNOSIS — Z1231 Encounter for screening mammogram for malignant neoplasm of breast: Secondary | ICD-10-CM | POA: Insufficient documentation

## 2017-10-01 DIAGNOSIS — Z1239 Encounter for other screening for malignant neoplasm of breast: Secondary | ICD-10-CM

## 2017-10-01 DIAGNOSIS — Z23 Encounter for immunization: Secondary | ICD-10-CM

## 2017-10-03 ENCOUNTER — Other Ambulatory Visit: Payer: Self-pay | Admitting: Internal Medicine

## 2017-10-03 NOTE — Telephone Encounter (Signed)
Last script 05/31/17  Last o/v 09/02/17 F/u 10/14/17

## 2017-10-04 DIAGNOSIS — J301 Allergic rhinitis due to pollen: Secondary | ICD-10-CM | POA: Diagnosis not present

## 2017-10-07 DIAGNOSIS — M2021 Hallux rigidus, right foot: Secondary | ICD-10-CM | POA: Diagnosis not present

## 2017-10-07 DIAGNOSIS — M79671 Pain in right foot: Secondary | ICD-10-CM | POA: Diagnosis not present

## 2017-10-07 DIAGNOSIS — M19071 Primary osteoarthritis, right ankle and foot: Secondary | ICD-10-CM | POA: Diagnosis not present

## 2017-10-08 DIAGNOSIS — J301 Allergic rhinitis due to pollen: Secondary | ICD-10-CM | POA: Diagnosis not present

## 2017-10-14 ENCOUNTER — Other Ambulatory Visit (INDEPENDENT_AMBULATORY_CARE_PROVIDER_SITE_OTHER): Payer: PPO

## 2017-10-14 DIAGNOSIS — R7989 Other specified abnormal findings of blood chemistry: Secondary | ICD-10-CM

## 2017-10-14 DIAGNOSIS — J301 Allergic rhinitis due to pollen: Secondary | ICD-10-CM | POA: Diagnosis not present

## 2017-10-14 LAB — TSH: TSH: 2.14 u[IU]/mL (ref 0.35–4.50)

## 2017-10-15 ENCOUNTER — Encounter: Payer: Self-pay | Admitting: Internal Medicine

## 2017-10-17 ENCOUNTER — Telehealth: Payer: Self-pay

## 2017-10-17 MED ORDER — METOPROLOL SUCCINATE ER 25 MG PO TB24: ORAL_TABLET | ORAL | 1 refills | Status: DC

## 2017-10-17 NOTE — Telephone Encounter (Signed)
Received refill request for metoprolol. Per patient since last visit 09/02/17, she is not taking as prescribed. She confirmed taking 1/2 tablet daily instead of 1 tab. States you wanted to lower medication? Per your notes you stated continue same medication regimen.  Please advise.

## 2017-10-17 NOTE — Telephone Encounter (Signed)
I sent in rx for 1/2 tablet metoprolol.

## 2017-10-21 DIAGNOSIS — J301 Allergic rhinitis due to pollen: Secondary | ICD-10-CM | POA: Diagnosis not present

## 2017-10-22 ENCOUNTER — Other Ambulatory Visit: Payer: Self-pay | Admitting: *Deleted

## 2017-10-22 MED ORDER — LOSARTAN POTASSIUM-HCTZ 50-12.5 MG PO TABS: ORAL_TABLET | ORAL | 2 refills | Status: DC

## 2017-11-04 DIAGNOSIS — J301 Allergic rhinitis due to pollen: Secondary | ICD-10-CM | POA: Diagnosis not present

## 2017-11-06 ENCOUNTER — Other Ambulatory Visit: Payer: Self-pay | Admitting: Internal Medicine

## 2017-11-08 ENCOUNTER — Other Ambulatory Visit: Payer: Self-pay | Admitting: Internal Medicine

## 2017-11-08 NOTE — Telephone Encounter (Signed)
Okay to refill? 

## 2017-11-10 NOTE — Telephone Encounter (Signed)
Per review of med history, it appears she had rx sent in for trazodone #90 with no refills.  Should not need.  Just need to clarify with pt need for refill now?

## 2017-11-13 DIAGNOSIS — J301 Allergic rhinitis due to pollen: Secondary | ICD-10-CM | POA: Diagnosis not present

## 2017-11-20 DIAGNOSIS — J301 Allergic rhinitis due to pollen: Secondary | ICD-10-CM | POA: Diagnosis not present

## 2017-11-22 ENCOUNTER — Other Ambulatory Visit: Payer: Self-pay | Admitting: *Deleted

## 2017-11-25 DIAGNOSIS — J301 Allergic rhinitis due to pollen: Secondary | ICD-10-CM | POA: Diagnosis not present

## 2017-11-28 ENCOUNTER — Ambulatory Visit (INDEPENDENT_AMBULATORY_CARE_PROVIDER_SITE_OTHER): Payer: PPO

## 2017-11-28 VITALS — BP 122/74 | HR 54 | Temp 98.2°F | Resp 14 | Ht 65.0 in | Wt 222.0 lb

## 2017-11-28 DIAGNOSIS — M48061 Spinal stenosis, lumbar region without neurogenic claudication: Secondary | ICD-10-CM | POA: Diagnosis not present

## 2017-11-28 DIAGNOSIS — Z0000 Encounter for general adult medical examination without abnormal findings: Secondary | ICD-10-CM

## 2017-11-28 NOTE — Progress Notes (Signed)
Subjective:   Erin Camacho is a 72 y.o. female who presents for Medicare Annual (Subsequent) preventive examination.  Review of Systems:  No ROS.  Medicare Wellness Visit. Additional risk factors are reflected in the social history.   Cardiac Risk Factors include: advanced age (>27men, >60 women);diabetes mellitus;obesity (BMI >30kg/m2);hypertension     Objective:     Vitals: BP 122/74 (BP Location: Left Arm, Patient Position: Sitting, Cuff Size: Normal)   Pulse (!) 54   Temp 98.2 F (36.8 C) (Oral)   Resp 14   Ht 5\' 5"  (1.651 m)   Wt 222 lb (100.7 kg)   SpO2 97%   BMI 36.94 kg/m   Body mass index is 36.94 kg/m.  Advanced Directives 11/28/2017 11/28/2016 11/25/2015 04/18/2013 04/09/2013  Does Patient Have a Medical Advance Directive? Yes Yes Yes Patient has advance directive, copy not in chart Patient has advance directive, copy not in chart  Type of Advance Directive Living will [place];Living will Healthcare Power of [place] -  Does patient want to make changes to medical advance directive? No - Patient declined No - Patient declined No - Patient declined - -  Copy of [place] in Chart? - No - copy requested No - copy requested Copy requested from family -  Pre-existing out of facility DNR order (yellow form or pink MOST form) - - - No -    Tobacco Social History   Tobacco Use  Smoking Status Never Smoker  Smokeless Tobacco Never Used     Counseling given: Not Answered   Clinical Intake:  Pre-visit preparation completed: Yes  Pain : No/denies pain     Nutritional Status: BMI > 30  Obese Diabetes: Yes(Followed by PCP)  How often do you need to have someone help you when you read instructions, pamphlets, or other written materials from your doctor or pharmacy?: 1 - Never  Interpreter Needed?: No     Past Medical History:  Diagnosis Date  . Allergy    hay fever  . Anxiety   . Arthritis    . Chronic back pain   . Diabetes mellitus    no meds      pt states she is not diabetic  . GERD (gastroesophageal reflux disease)   . Hyperlipidemia   . Hypertension   . Pneumonia   . Spinal stenosis   . UTI (lower urinary tract infection)    Past Surgical History:  Procedure Laterality Date  . ABDOMINAL HYSTERECTOMY  1973  . ABDOMINAL HYSTERECTOMY    . APPENDECTOMY    . BACK SURGERY  04/17/13  . CHOLECYSTECTOMY  1995  . HERNIA REPAIR  08/2006  . lap band  08/2003  . SEPTOPLASTY  04/1978  . TONSILLECTOMY AND ADENOIDECTOMY  1979  . TOTAL HIP ARTHROPLASTY  02/2012   Right, Dr. Marry Guan   Family History  Problem Relation Age of Onset  . Hypertension Mother   . Stroke Mother   . Heart disease Mother   . Hypertension Father   . Stroke Father   . Heart disease Father   . Arthritis Sister   . Diabetes Sister   . Dementia Sister   . Cancer Sister   . Diabetes Brother   . Hypertension Brother   . Diabetes Brother   . Hypertension Brother   . Breast cancer Other 47  . Cancer Other        breast  . Diabetes Other   .  Kidney disease Neg Hx   . Bladder Cancer Neg Hx    Social History   Socioeconomic History  . Marital status: Married    Spouse name: None  . Number of children: None  . Years of education: None  . Highest education level: None  Social Needs  . Financial resource strain: None  . Food insecurity - worry: None  . Food insecurity - inability: None  . Transportation needs - medical: None  . Transportation needs - non-medical: None  Occupational History  . None  Tobacco Use  . Smoking status: Never Smoker  . Smokeless tobacco: Never Used  Substance and Sexual Activity  . Alcohol use: Yes    Alcohol/week: 0.6 oz    Types: 1 Glasses of wine per week    Comment: occ  . Drug use: No  . Sexual activity: No  Other Topics Concern  . None  Social History Narrative   Lives in [place] with husband.      Work - retired Sports administrator    Outpatient  Encounter Medications as of 11/28/2017  Medication Sig  . albuterol (PROVENTIL) (2.5 MG/3ML) 0.083% nebulizer solution Take 3 mLs (2.5 mg total) by nebulization every 6 (six) hours as needed for wheezing. Please disp with nebulizer machine  . ALPRAZolam (XANAX) 0.25 MG tablet Take 1 tablet (0.25 mg total) by mouth daily as needed for anxiety.  . Cholecalciferol (VITAMIN D-3) 1000 UNITS CAPS Take 1 capsule by mouth daily.  . cyclobenzaprine (FLEXERIL) 5 MG tablet Take 1 tablet (5 mg total) by mouth 3 (three) times daily as needed for muscle spasms.  . fexofenadine (ALLEGRA) 180 MG tablet Take 1 tablet (180 mg total) by mouth daily.  . fluticasone (FLONASE) 50 MCG/ACT nasal spray Place 2 sprays into both nostrils daily.  Marland Kitchen losartan-hydrochlorothiazide (HYZAAR) 50-12.5 MG tablet TAKE ONE (1) TABLET EACH DAY  . meloxicam (MOBIC) 7.5 MG tablet Take 7.5 mg by mouth daily.  . metoprolol succinate (TOPROL-XL) 25 MG 24 hr tablet TAKE ONE 1/2 tablet per day.  . Multiple Vitamin (MULTIVITAMIN) tablet Take 1 tablet by mouth daily.  . sertraline (ZOLOFT) 100 MG tablet TAKE HALF A TABLET (50 MG) BY MOUTH DAILY  . simvastatin (ZOCOR) 10 MG tablet TAKE ONE-HALF TABLET (5 MG TOTAL) BY MOUTH AT BEDTIME.  . traMADol (ULTRAM) 50 MG tablet Take 1 tablet (50 mg total) 3 (three) times daily as needed by mouth.  . traZODone (DESYREL) 100 MG tablet TAKE 1 TABLET BY MOUTH EVERYDAY AT BEDTIME   No facility-administered encounter medications on file as of 11/28/2017.     Activities of Daily Living In your present state of health, do you have any difficulty performing the following activities: 11/28/2016  Hearing? N  Vision? N  Difficulty concentrating or making decisions? N  Walking or climbing stairs? Y  Comment SOB on exertion.  Back pain.  Dressing or bathing? N  Doing errands, shopping? N  Preparing Food and eating ? N  Using the Toilet? N  In the past six months, have you accidently leaked urine? N  Do you  have problems with loss of bowel control? N  Managing your Medications? N  Managing your Finances? N  Housekeeping or managing your Housekeeping? N  Some recent data might be hidden    Patient Care Team: [name], MD as PCP - General (Internal Medicine)    Assessment:   This is a routine wellness examination for [facility]. The goal of the wellness visit is  to assist the patient how to close the gaps in care and create a preventative care plan for the patient.   The roster of all physicians providing medical care to patient is listed in the Snapshot section of the chart.  Taking calcium VIT D as appropriate/Osteoporosis risk reviewed.    Safety issues reviewed; Smoke and carbon monoxide detectors in the home. No firearms in the home.  Wears seatbelts when driving or riding with others. Patient does wear sunscreen or protective clothing when in direct sunlight. No violence in the home.  Patient is alert, normal appearance, oriented to person/place/and time.  Correctly identified the president of the [place], recall of 3/3 words, and performing simple calculations. Displays appropriate judgement and can read correct time from watch face.   No new identified risk were noted.  No failures at ADL's or IADL's.   BMI- discussed the importance of a healthy diet, water intake and the benefits of aerobic exercise. Educational material provided.   24 hour diet recall: Regular diet  Daily fluid intake: 1-3 cups of caffeine, 2 cups of water  Dental- every 6 months.   Eye- Visual acuity not assessed per patient preference since they have regular follow up with the ophthalmologist.  Wears corrective lenses.  Sleep patterns- Sleeps 8 hours at night.  Wakes feeling rested.  TDAP vaccine deferred per patient preference.  Follow up with insurance.  Educational material provided.  Patient Concerns: None at this time. Follow up with PCP as needed.  Exercise Activities and Dietary  recommendations    Goals    . DIET - INCREASE WATER INTAKE    . Increase physical activity     Walk for exercise, as tolerated    . Weight (lb) < 200 lb (90.7 kg)     Decrease sugar intake. Low carb foods.  Educational material provided.       Fall Risk Fall Risk  11/28/2017 04/25/2017 01/29/2017 11/28/2016 08/13/2016  Falls in the past year? No No No Yes No  Number falls in past yr: - - - 1 -  Injury with Fall? - - - No -  Follow up - - - Falls prevention discussed;Education provided -   Depression Screen PHQ 2/9 Scores 11/28/2017 04/25/2017 01/29/2017 11/28/2016  PHQ - 2 Score 0 0 0 0     Cognitive Function MMSE - Mini Mental State Exam 11/28/2017 11/28/2016  Orientation to time 5 5  Orientation to Place 5 5  Registration 3 3  Attention/ Calculation 5 5  Recall 3 3  Language- name 2 objects 2 2  Language- repeat 1 1  Language- follow 3 step command 3 3  Language- read & follow direction 1 1  Write a sentence 1 1  Copy design 1 1  Total score 30 30        Immunization History  Administered Date(s) Administered  . Influenza Split 09/25/2012  . Influenza Whole 09/07/2013  . Influenza, High Dose Seasonal PF 08/13/2016, 09/02/2017  . Influenza,inj,Quad PF,6+ Mos 07/27/2014, 08/25/2015  . Pneumococcal Conjugate-13 01/18/2014  . Pneumococcal Polysaccharide-23 03/15/2012  . Tdap 02/28/2012  . Zoster 08/27/2012   Screening Tests Health Maintenance  Topic Date Due  . FOOT EXAM  07/15/2014  . COLONOSCOPY  07/07/2017  . HEMOGLOBIN A1C  02/26/2018  . OPHTHALMOLOGY EXAM  05/28/2018  . MAMMOGRAM  10/01/2018  . TETANUS/TDAP  02/27/2022  . INFLUENZA VACCINE  Completed  . DEXA SCAN  Completed  . Hepatitis C Screening  Completed  . PNA  vac Low Risk Adult  Completed      Plan:    End of life planning; Advance aging; Advanced directives discussed. Copy of current HCPOA/Living Will requested.    I have personally reviewed and noted the following in the patient's chart:    . Medical and social history . Use of alcohol, tobacco or illicit drugs  . Current medications and supplements . Functional ability and status . Nutritional status . Physical activity . Advanced directives . List of other physicians . Hospitalizations, surgeries, and ER visits in previous 12 months . Vitals . Screenings to include cognitive, depression, and falls . Referrals and appointments  In addition, I have reviewed and discussed with patient certain preventive protocols, quality metrics, and best practice recommendations. A written personalized care plan for preventive services as well as general preventive health recommendations were provided to patient.     Varney Biles, LPN  07/13/1897   Reviewed above information.  Agree with assessment and plan.    Dr Nicki Reaper

## 2017-11-28 NOTE — Patient Instructions (Addendum)
  Ms. Perkinson , Thank you for taking time to come for your Medicare Wellness Visit. I appreciate your ongoing commitment to your health goals. Please review the following plan we discussed and let me know if I can assist you in the future.   Follow up with Dr. Nicki Reaper as needed.    Bring a copy of your Living Will to be scanned into chart.  Have a great day!  These are the goals we discussed: Goals    . DIET - INCREASE WATER INTAKE    . Increase physical activity     Walk for exercise, as tolerated    . Weight (lb) < 200 lb (90.7 kg)     Decrease sugar intake. Low carb foods.  Educational material provided.       This is a list of the screening recommended for you and due dates:  Health Maintenance  Topic Date Due  . Complete foot exam   07/15/2014  . Colon Cancer Screening  07/07/2017  . Hemoglobin A1C  02/26/2018  . Eye exam for diabetics  05/28/2018  . Mammogram  10/01/2018  . Tetanus Vaccine  02/27/2022  . Flu Shot  Completed  . DEXA scan (bone density measurement)  Completed  .  Hepatitis C: One time screening is recommended by Center for Disease Control  (CDC) for  adults born from [birth year] through 1966.   Completed  . Pneumonia vaccines  Completed

## 2017-12-02 DIAGNOSIS — J301 Allergic rhinitis due to pollen: Secondary | ICD-10-CM | POA: Diagnosis not present

## 2017-12-04 ENCOUNTER — Other Ambulatory Visit: Payer: Self-pay | Admitting: Internal Medicine

## 2017-12-04 DIAGNOSIS — L900 Lichen sclerosus et atrophicus: Secondary | ICD-10-CM | POA: Diagnosis not present

## 2017-12-09 DIAGNOSIS — M5416 Radiculopathy, lumbar region: Secondary | ICD-10-CM | POA: Diagnosis not present

## 2017-12-12 DIAGNOSIS — J301 Allergic rhinitis due to pollen: Secondary | ICD-10-CM | POA: Diagnosis not present

## 2017-12-16 DIAGNOSIS — J301 Allergic rhinitis due to pollen: Secondary | ICD-10-CM | POA: Diagnosis not present

## 2017-12-23 DIAGNOSIS — J301 Allergic rhinitis due to pollen: Secondary | ICD-10-CM | POA: Diagnosis not present

## 2017-12-24 NOTE — Telephone Encounter (Signed)
Not needed

## 2017-12-30 DIAGNOSIS — J301 Allergic rhinitis due to pollen: Secondary | ICD-10-CM | POA: Diagnosis not present

## 2018-01-02 DIAGNOSIS — J301 Allergic rhinitis due to pollen: Secondary | ICD-10-CM | POA: Diagnosis not present

## 2018-01-06 DIAGNOSIS — J301 Allergic rhinitis due to pollen: Secondary | ICD-10-CM | POA: Diagnosis not present

## 2018-01-08 ENCOUNTER — Other Ambulatory Visit: Payer: Self-pay | Admitting: Internal Medicine

## 2018-01-08 ENCOUNTER — Ambulatory Visit (INDEPENDENT_AMBULATORY_CARE_PROVIDER_SITE_OTHER): Payer: PPO | Admitting: Internal Medicine

## 2018-01-08 ENCOUNTER — Encounter: Payer: Self-pay | Admitting: Internal Medicine

## 2018-01-08 VITALS — BP 128/72 | HR 70 | Temp 98.2°F | Resp 18 | Wt 218.4 lb

## 2018-01-08 DIAGNOSIS — J329 Chronic sinusitis, unspecified: Secondary | ICD-10-CM | POA: Diagnosis not present

## 2018-01-08 DIAGNOSIS — F3341 Major depressive disorder, recurrent, in partial remission: Secondary | ICD-10-CM | POA: Diagnosis not present

## 2018-01-08 DIAGNOSIS — E7800 Pure hypercholesterolemia, unspecified: Secondary | ICD-10-CM | POA: Diagnosis not present

## 2018-01-08 DIAGNOSIS — E119 Type 2 diabetes mellitus without complications: Secondary | ICD-10-CM

## 2018-01-08 DIAGNOSIS — I10 Essential (primary) hypertension: Secondary | ICD-10-CM | POA: Diagnosis not present

## 2018-01-08 DIAGNOSIS — I700 Atherosclerosis of aorta: Secondary | ICD-10-CM

## 2018-01-08 DIAGNOSIS — Z0000 Encounter for general adult medical examination without abnormal findings: Secondary | ICD-10-CM | POA: Diagnosis not present

## 2018-01-08 MED ORDER — HYDROCOD POLST-CPM POLST ER 10-8 MG/5ML PO SUER: 5.0000 mL | Freq: Every evening | ORAL | 0 refills | Status: DC | PRN

## 2018-01-08 MED ORDER — AMOXICILLIN 875 MG PO TABS: 875.0000 mg | ORAL_TABLET | Freq: Two times a day (BID) | ORAL | 0 refills | Status: DC

## 2018-01-08 MED ORDER — TRAZODONE HCL 100 MG PO TABS: ORAL_TABLET | ORAL | 0 refills | Status: DC

## 2018-01-08 MED ORDER — ALPRAZOLAM 0.25 MG PO TABS: 0.2500 mg | ORAL_TABLET | Freq: Every day | ORAL | 0 refills | Status: DC | PRN

## 2018-01-08 NOTE — Assessment & Plan Note (Addendum)
Physical today 01/08/18.  Mammogram 10/01/17 - birads I.  Will need f/u colonoscopy.  Treat current infection.

## 2018-01-08 NOTE — Patient Instructions (Signed)
Take a probitoic while you are on the antibiotic and for two weeks after completing the antibiotic.

## 2018-01-08 NOTE — Progress Notes (Signed)
Patient ID: Erin Camacho, female   DOB: [birth year], 72 y.o.   MRN: [id number]   Subjective:    Patient ID: Erin Camacho, female    DOB: 02/07/1946, 72 y.o.   MRN: [id number]  HPI  Patient here for her physical exam.  She reports she is doing relatively well.  Has had sinus congestion for the last 3 weeks.  Increased cough and congestion.  Right maxillary pressure.  Colored mucus production.  Right ear fullness.  Dry cough.  Taking robitussin.  Has raised the head of the bed.  No chest congestion.  No acid reflux.  No abdominal pain.  No sob.  Bowels stable.  Had f/u with Dr Leafy Ro for Lichen Sclerosus.  Also followed by Dr Ellene Route for her back.  S/p injection.  Helped.  No increased pain.  Not needing tramadol or alleve.     Past Medical History:  Diagnosis Date  . Allergy    hay fever  . Anxiety   . Arthritis   . Chronic back pain   . Diabetes mellitus    no meds      pt states she is not diabetic  . GERD (gastroesophageal reflux disease)   . Hyperlipidemia   . Hypertension   . Pneumonia   . Spinal stenosis   . UTI (lower urinary tract infection)    Past Surgical History:  Procedure Laterality Date  . ABDOMINAL HYSTERECTOMY  1973  . ABDOMINAL HYSTERECTOMY    . APPENDECTOMY    . BACK SURGERY  04/17/13  . CHOLECYSTECTOMY  1995  . HERNIA REPAIR  08/2006  . lap band  08/2003  . SEPTOPLASTY  04/1978  . TONSILLECTOMY AND ADENOIDECTOMY  1979  . TOTAL HIP ARTHROPLASTY  02/2012   Right, Dr. Marry Guan   Family History  Problem Relation Age of Onset  . Hypertension Mother   . Stroke Mother   . Heart disease Mother   . Hypertension Father   . Stroke Father   . Heart disease Father   . Arthritis Sister   . Diabetes Sister   . Dementia Sister   . Cancer Sister   . Diabetes Brother   . Hypertension Brother   . Diabetes Brother   . Hypertension Brother   . Breast cancer Other 47  . Cancer Other        breast  . Diabetes Other   . Kidney disease Neg Hx   . Bladder Cancer Neg Hx     Social History   Socioeconomic History  . Marital status: Married    Spouse name: None  . Number of children: None  . Years of education: None  . Highest education level: None  Social Needs  . Financial resource strain: None  . Food insecurity - worry: None  . Food insecurity - inability: None  . Transportation needs - medical: None  . Transportation needs - non-medical: None  Occupational History  . None  Tobacco Use  . Smoking status: Never Smoker  . Smokeless tobacco: Never Used  Substance and Sexual Activity  . Alcohol use: Yes    Alcohol/week: 0.6 oz    Types: 1 Glasses of wine per week    Comment: occ  . Drug use: No  . Sexual activity: No  Other Topics Concern  . None  Social History Narrative   Lives in [place] with husband.      Work - retired Sports administrator    Outpatient Encounter Medications as of 01/08/2018  Medication Sig  . albuterol (PROVENTIL) (2.5 MG/3ML) 0.083% nebulizer solution Take 3 mLs (2.5 mg total) by nebulization every 6 (six) hours as needed for wheezing. Please disp with nebulizer machine  . ALPRAZolam (XANAX) 0.25 MG tablet Take 1 tablet (0.25 mg total) by mouth daily as needed for anxiety.  . Cholecalciferol (VITAMIN D-3) 1000 UNITS CAPS Take 1 capsule by mouth daily.  . cyclobenzaprine (FLEXERIL) 5 MG tablet Take 1 tablet (5 mg total) by mouth 3 (three) times daily as needed for muscle spasms.  . fexofenadine (ALLEGRA) 180 MG tablet Take 1 tablet (180 mg total) by mouth daily.  . fluticasone (FLONASE) 50 MCG/ACT nasal spray SPRAY 2 SPRAYS INTO EACH NOSTRIL EVERY DAY  . losartan-hydrochlorothiazide (HYZAAR) 50-12.5 MG tablet TAKE ONE (1) TABLET EACH DAY  . meloxicam (MOBIC) 7.5 MG tablet Take 7.5 mg by mouth daily.  . metoprolol succinate (TOPROL-XL) 25 MG 24 hr tablet TAKE ONE 1/2 tablet per day.  . Multiple Vitamin (MULTIVITAMIN) tablet Take 1 tablet by mouth daily.  . sertraline (ZOLOFT) 100 MG tablet TAKE HALF A TABLET (50 MG) BY  MOUTH DAILY  . simvastatin (ZOCOR) 10 MG tablet TAKE ONE-HALF TABLET (5 MG TOTAL) BY MOUTH AT BEDTIME.  . traMADol (ULTRAM) 50 MG tablet Take 1 tablet (50 mg total) 3 (three) times daily as needed by mouth.  . traZODone (DESYREL) 100 MG tablet TAKE 1 TABLET BY MOUTH EVERYDAY AT BEDTIME  . [DISCONTINUED] ALPRAZolam (XANAX) 0.25 MG tablet Take 1 tablet (0.25 mg total) by mouth daily as needed for anxiety.  . [DISCONTINUED] traZODone (DESYREL) 100 MG tablet TAKE 1 TABLET BY MOUTH EVERYDAY AT BEDTIME  . amoxicillin (AMOXIL) 875 MG tablet Take 1 tablet (875 mg total) by mouth 2 (two) times daily.  . chlorpheniramine-HYDROcodone (TUSSIONEX PENNKINETIC ER) 10-8 MG/5ML SUER Take 5 mLs by mouth at bedtime as needed for cough.   No facility-administered encounter medications on file as of 01/08/2018.     Review of Systems  Constitutional: Negative for appetite change and unexpected weight change.  HENT: Positive for congestion, postnasal drip and sinus pressure.   Eyes: Negative for pain and visual disturbance.  Respiratory: Positive for cough. Negative for chest tightness and shortness of breath.   Cardiovascular: Negative for chest pain, palpitations and leg swelling.  Gastrointestinal: Negative for abdominal pain, diarrhea, nausea and vomiting.  Genitourinary: Negative for difficulty urinating and dysuria.  Musculoskeletal: Negative for joint swelling and myalgias.       Back is better.  S/p injection.  Not requiring tramadol or alleve.    Skin: Negative for color change and rash.  Neurological: Negative for dizziness and headaches.  Hematological: Negative for adenopathy. Does not bruise/bleed easily.  Psychiatric/Behavioral: Negative for decreased concentration and dysphoric mood.       Objective:    Physical Exam  Constitutional: She is oriented to person, place, and time. She appears well-developed and well-nourished. No distress.  HENT:  Mouth/Throat: Oropharynx is clear and moist.    Right maxillary discomfort to palpation.  TMs visualized without erythema.  Nares - slightly erythematous turbinates.    Eyes: Right eye exhibits no discharge. Left eye exhibits no discharge. No scleral icterus.  Neck: Neck supple. No thyromegaly present.  Cardiovascular: Normal rate and regular rhythm.  Pulmonary/Chest: Breath sounds normal. No accessory muscle usage. No tachypnea. No respiratory distress. She has no decreased breath sounds. She has no wheezes. She has no rhonchi. Right breast exhibits no inverted nipple, no mass, no nipple discharge and  no tenderness (no axillary adenopathy). Left breast exhibits no inverted nipple, no mass, no nipple discharge and no tenderness (no axilarry adenopathy).  Abdominal: Soft. Bowel sounds are normal. There is no tenderness.  Musculoskeletal: She exhibits no edema or tenderness.  Lymphadenopathy:    She has no cervical adenopathy.  Neurological: She is alert and oriented to person, place, and time.  Skin: Skin is warm. No rash noted. No erythema.  Psychiatric: She has a normal mood and affect. Her behavior is normal.    BP 128/72 (BP Location: Left Arm, Patient Position: Sitting, Cuff Size: Large)   Pulse 70   Temp 98.2 F (36.8 C) (Oral)   Resp 18   Wt 218 lb 6.4 oz (99.1 kg)   SpO2 97%   BMI 36.34 kg/m  Wt Readings from Last 3 Encounters:  01/08/18 218 lb 6.4 oz (99.1 kg)  11/28/17 222 lb (100.7 kg)  09/02/17 220 lb 6.4 oz (100 kg)     Lab Results  Component Value Date   WBC 4.7 08/28/2017   HGB 13.0 08/28/2017   HCT 39.4 08/28/2017   PLT 192.0 08/28/2017   GLUCOSE 136 (H) 08/28/2017   CHOL 171 08/28/2017   TRIG 116.0 08/28/2017   HDL 56.70 08/28/2017   LDLDIRECT 146.5 09/19/2012   LDLCALC 91 08/28/2017   ALT 10 08/28/2017   AST 14 08/28/2017   NA 140 08/28/2017   K 4.2 08/28/2017   CL 103 08/28/2017   CREATININE 0.91 08/28/2017   BUN 18 08/28/2017   CO2 30 08/28/2017   TSH 2.14 10/14/2017   INR 1.0 11/22/2015    HGBA1C 6.3 08/28/2017   MICROALBUR 1.2 02/14/2017    Mm Digital Screening Bilateral  Result Date: 10/01/2017 CLINICAL DATA:  Screening. EXAM: DIGITAL SCREENING BILATERAL MAMMOGRAM WITH CAD COMPARISON:  Previous exam(s). ACR Breast Density Category b: There are scattered areas of fibroglandular density. FINDINGS: There are no findings suspicious for malignancy. Images were processed with CAD. IMPRESSION: No mammographic evidence of malignancy. A result letter of this screening mammogram will be mailed directly to the patient. RECOMMENDATION: Screening mammogram in one year. (Code:SM-B-01Y) BI-RADS CATEGORY  1: Negative. Electronically Signed   By: Dorise Bullion III M.D   On: 10/01/2017 14:30       Assessment & Plan:   Problem List Items Addressed This Visit    Aortic atherosclerosis ([place])    On simvastatin.        Diabetes mellitus type 2, diet-controlled ([place])    Discussed diet and exercise.  Low carb diet.  Follow met b and a1c.       Relevant Orders   Hemoglobin B7S   Basic metabolic panel   Essential (primary) hypertension    Blood pressure under good control.  Continue same medication regimen.  Follow pressures.  Follow metabolic panel.        Relevant Orders   TSH   Healthcare maintenance    Physical today 01/08/18.  Mammogram 10/01/17 - birads I.  Will need f/u colonoscopy.  Treat current infection.        Hypercholesterolemia    On simvastatin.  Low cholesterol diet and exercise.  Follow lipid panel and liver function tests.        Relevant Orders   Lipid panel   Hepatic function panel   Major depression    Stable.  On sertraline.        Relevant Medications   ALPRAZolam (XANAX) 0.25 MG tablet   traZODone (DESYREL) 100 MG tablet  Other Visit Diagnoses    Routine general medical examination at a health care facility    -  Primary   Sinusitis, unspecified chronicity, unspecified location       Treat with amoxicillin.  nasacort nasal spray and saline nasal  spray.  mucinex as directed.  follow.     Relevant Medications   amoxicillin (AMOXIL) 875 MG tablet   chlorpheniramine-HYDROcodone (TUSSIONEX PENNKINETIC ER) 10-8 MG/5ML Georgianne Fick, MD

## 2018-01-11 ENCOUNTER — Encounter: Payer: Self-pay | Admitting: Internal Medicine

## 2018-01-11 DIAGNOSIS — I700 Atherosclerosis of aorta: Secondary | ICD-10-CM | POA: Insufficient documentation

## 2018-01-11 NOTE — Assessment & Plan Note (Signed)
On simvastatin.   

## 2018-01-11 NOTE — Assessment & Plan Note (Signed)
Blood pressure under good control.  Continue same medication regimen.  Follow pressures.  Follow metabolic panel.   

## 2018-01-11 NOTE — Assessment & Plan Note (Signed)
Stable.  On sertraline.

## 2018-01-11 NOTE — Assessment & Plan Note (Signed)
Discussed diet and exercise.  Low carb diet.  Follow met b and a1c.   

## 2018-01-11 NOTE — Assessment & Plan Note (Signed)
On simvastatin.  Low cholesterol diet and exercise.  Follow lipid panel and liver function tests.   

## 2018-01-13 DIAGNOSIS — J301 Allergic rhinitis due to pollen: Secondary | ICD-10-CM | POA: Diagnosis not present

## 2018-01-20 DIAGNOSIS — J301 Allergic rhinitis due to pollen: Secondary | ICD-10-CM | POA: Diagnosis not present

## 2018-01-24 ENCOUNTER — Telehealth: Payer: Self-pay | Admitting: *Deleted

## 2018-01-24 NOTE — Telephone Encounter (Signed)
Copied from [place]. Topic: General - Other >> Jan 24, 2018  8:39 AM Oneta Rack wrote: Relation to pt: self  Call back number: [phone] [id number] [place] [place] [phone] (Phone) [phone] (Fax)    Reason for call:  Patient states she's experiencing on going muscle spasm and PCP is aware. Patient states flexeril is not working and would like an alternate Rx, please advise >> Jan 24, 2018  8:50 AM Oneta Rack wrote: Relation to pt: self  Call back number: [phone] [id number] [place], [place] [phone] (Phone) [phone] (Fax)    Reason for call:  Patient states she's experiencing on going muscle spasm and PCP is aware. Patient states flexeril is not working and would like an alternate Rx, please advise

## 2018-01-24 NOTE — Telephone Encounter (Signed)
Is there something else we can try?

## 2018-01-24 NOTE — Telephone Encounter (Signed)
I assume she is talking about spasms in her back.  She has back issues.  Has been seeing Dr Ellene Route.  If she is having increased problems not responding to muscle relaxer, needs to be evaluated.  If increased back issues, would recommend her contacting Dr Ellene Route.

## 2018-01-24 NOTE — Telephone Encounter (Signed)
Patient stated she would call Dr. Ellene Route

## 2018-01-27 DIAGNOSIS — J301 Allergic rhinitis due to pollen: Secondary | ICD-10-CM | POA: Diagnosis not present

## 2018-01-30 DIAGNOSIS — M6240 Contracture of muscle, unspecified site: Secondary | ICD-10-CM | POA: Diagnosis not present

## 2018-01-30 DIAGNOSIS — M9903 Segmental and somatic dysfunction of lumbar region: Secondary | ICD-10-CM | POA: Diagnosis not present

## 2018-01-30 DIAGNOSIS — Z72820 Sleep deprivation: Secondary | ICD-10-CM | POA: Diagnosis not present

## 2018-01-30 DIAGNOSIS — M5430 Sciatica, unspecified side: Secondary | ICD-10-CM | POA: Diagnosis not present

## 2018-01-30 DIAGNOSIS — M9905 Segmental and somatic dysfunction of pelvic region: Secondary | ICD-10-CM | POA: Diagnosis not present

## 2018-01-30 DIAGNOSIS — M791 Myalgia, unspecified site: Secondary | ICD-10-CM | POA: Diagnosis not present

## 2018-01-30 DIAGNOSIS — M9902 Segmental and somatic dysfunction of thoracic region: Secondary | ICD-10-CM | POA: Diagnosis not present

## 2018-02-03 DIAGNOSIS — J301 Allergic rhinitis due to pollen: Secondary | ICD-10-CM | POA: Diagnosis not present

## 2018-02-05 ENCOUNTER — Other Ambulatory Visit: Payer: PPO

## 2018-02-07 ENCOUNTER — Other Ambulatory Visit (INDEPENDENT_AMBULATORY_CARE_PROVIDER_SITE_OTHER): Payer: PPO

## 2018-02-07 DIAGNOSIS — E7800 Pure hypercholesterolemia, unspecified: Secondary | ICD-10-CM | POA: Diagnosis not present

## 2018-02-07 DIAGNOSIS — I10 Essential (primary) hypertension: Secondary | ICD-10-CM | POA: Diagnosis not present

## 2018-02-07 DIAGNOSIS — E119 Type 2 diabetes mellitus without complications: Secondary | ICD-10-CM

## 2018-02-07 LAB — LIPID PANEL
Cholesterol: 192 mg/dL (ref 0–200)
HDL: 79.5 mg/dL
LDL Cholesterol: 98 mg/dL (ref 0–99)
NonHDL: 112.41
Total CHOL/HDL Ratio: 2
Triglycerides: 73 mg/dL (ref 0.0–149.0)
VLDL: 14.6 mg/dL (ref 0.0–40.0)

## 2018-02-07 LAB — HEPATIC FUNCTION PANEL
ALT: 15 U/L (ref 0–35)
AST: 19 U/L (ref 0–37)
Albumin: 4.4 g/dL (ref 3.5–5.2)
Alkaline Phosphatase: 76 U/L (ref 39–117)
Bilirubin, Direct: 0.1 mg/dL (ref 0.0–0.3)
Total Bilirubin: 0.8 mg/dL (ref 0.2–1.2)
Total Protein: 7.4 g/dL (ref 6.0–8.3)

## 2018-02-07 LAB — BASIC METABOLIC PANEL WITH GFR
BUN: 20 mg/dL (ref 6–23)
CO2: 30 meq/L (ref 19–32)
Calcium: 10.4 mg/dL (ref 8.4–10.5)
Chloride: 104 meq/L (ref 96–112)
Creatinine, Ser: 0.87 mg/dL (ref 0.40–1.20)
GFR: 67.88 mL/min
Glucose, Bld: 137 mg/dL — ABNORMAL HIGH (ref 70–99)
Potassium: 4.4 meq/L (ref 3.5–5.1)
Sodium: 142 meq/L (ref 135–145)

## 2018-02-07 LAB — TSH: TSH: 1.46 u[IU]/mL (ref 0.35–4.50)

## 2018-02-07 LAB — HEMOGLOBIN A1C: Hgb A1c MFr Bld: 6.4 % (ref 4.6–6.5)

## 2018-02-10 ENCOUNTER — Telehealth: Payer: Self-pay | Admitting: Internal Medicine

## 2018-02-10 DIAGNOSIS — J301 Allergic rhinitis due to pollen: Secondary | ICD-10-CM | POA: Diagnosis not present

## 2018-02-10 MED ORDER — HYDROCHLOROTHIAZIDE 12.5 MG PO TABS: 12.5000 mg | ORAL_TABLET | Freq: Every day | ORAL | 3 refills | Status: DC

## 2018-02-10 MED ORDER — LOSARTAN POTASSIUM 50 MG PO TABS: 50.0000 mg | ORAL_TABLET | Freq: Every day | ORAL | 3 refills | Status: DC

## 2018-02-10 NOTE — Telephone Encounter (Signed)
If she is agreeable, we can send in cozaar 50mg  q day and hctz 12.5mg  q day.  Let me know if any problems.

## 2018-02-10 NOTE — Telephone Encounter (Signed)
Copied from [place] [phone] [phone]. Topic: Quick Communication - Rx Refill/Question >> Feb 10, 2018 11:37 AM Boyd Kerbs wrote:  Medication: losartan-hydrochlorothiazide (HYZAAR) 50-12.5 MG tablet  [facility]  Has the patient contacted their pharmacy? Yes.    (Agent: If no, request that the patient contact the pharmacy for the refill.) Preferred Pharmacy (with phone number or street name):   [id number] [place], [address] [place] [address] Phone: [phone] Fax: [phone]   Agent: Please be advised that RX refills may take up to 3 business days. We ask that you follow-up with your pharmacy.

## 2018-02-10 NOTE — Telephone Encounter (Signed)
See previous telephone encounter for today.  This medication will be changed.

## 2018-02-10 NOTE — Telephone Encounter (Signed)
Copied from [place] [phone]. Topic: Quick Communication - Rx Refill/Question >> Feb 10, 2018 11:40 AM Boyd Kerbs wrote:  Medication: losartan-hydrochlorothiazide (HYZAAR) 50-12.5 MG tablet  [facility] would not [facility]  Has the patient contacted their pharmacy? Yes.    (Agent: If no, request that the patient contact the pharmacy for the refill.) Preferred Pharmacy (with phone number or street name):   [id number] - [place], [address] [place] [address] Phone: [phone] Fax: [phone]   Agent: Please be advised that RX refills may take up to 3 business days. We ask that you follow-up with your pharmacy.

## 2018-02-10 NOTE — Telephone Encounter (Signed)
Left message for patient to return call to office need to know patient agreeable to PCP advise, [facility] nurse may advise.

## 2018-02-10 NOTE — Telephone Encounter (Signed)
Patient is agreeable to have the two separate medications.  [facility] [place] [facility] /

## 2018-02-10 NOTE — Telephone Encounter (Signed)
Refill  Substitution  Request   Hyzaar  50-12.5 mg  1 tab  QD  [facility]   [address]    [facility] [place]    The  Medication is  No longer  available  anywhere  anymore and  most providers  Are  Either  Breaking  Med  Up  Or  Substituting   LOV  01/08/2018

## 2018-02-10 NOTE — Telephone Encounter (Signed)
Medications sent as requested.

## 2018-02-11 ENCOUNTER — Encounter: Payer: Self-pay | Admitting: Internal Medicine

## 2018-02-17 DIAGNOSIS — J301 Allergic rhinitis due to pollen: Secondary | ICD-10-CM | POA: Diagnosis not present

## 2018-02-21 DIAGNOSIS — T444X5A Adverse effect of predominantly alpha-adrenoreceptor agonists, initial encounter: Secondary | ICD-10-CM | POA: Diagnosis not present

## 2018-02-21 DIAGNOSIS — J301 Allergic rhinitis due to pollen: Secondary | ICD-10-CM | POA: Diagnosis not present

## 2018-02-21 DIAGNOSIS — K219 Gastro-esophageal reflux disease without esophagitis: Secondary | ICD-10-CM | POA: Diagnosis not present

## 2018-02-24 DIAGNOSIS — J301 Allergic rhinitis due to pollen: Secondary | ICD-10-CM | POA: Diagnosis not present

## 2018-02-26 ENCOUNTER — Other Ambulatory Visit: Payer: Self-pay | Admitting: Internal Medicine

## 2018-03-03 DIAGNOSIS — J301 Allergic rhinitis due to pollen: Secondary | ICD-10-CM | POA: Diagnosis not present

## 2018-03-10 DIAGNOSIS — J301 Allergic rhinitis due to pollen: Secondary | ICD-10-CM | POA: Diagnosis not present

## 2018-03-17 DIAGNOSIS — J301 Allergic rhinitis due to pollen: Secondary | ICD-10-CM | POA: Diagnosis not present

## 2018-03-19 DIAGNOSIS — M19071 Primary osteoarthritis, right ankle and foot: Secondary | ICD-10-CM | POA: Diagnosis not present

## 2018-03-19 DIAGNOSIS — M2021 Hallux rigidus, right foot: Secondary | ICD-10-CM | POA: Diagnosis not present

## 2018-03-19 DIAGNOSIS — M79671 Pain in right foot: Secondary | ICD-10-CM | POA: Diagnosis not present

## 2018-03-24 DIAGNOSIS — J301 Allergic rhinitis due to pollen: Secondary | ICD-10-CM | POA: Diagnosis not present

## 2018-03-25 ENCOUNTER — Other Ambulatory Visit: Payer: Self-pay | Admitting: Internal Medicine

## 2018-03-26 DIAGNOSIS — R05 Cough: Secondary | ICD-10-CM | POA: Diagnosis not present

## 2018-03-26 DIAGNOSIS — J040 Acute laryngitis: Secondary | ICD-10-CM | POA: Diagnosis not present

## 2018-03-26 DIAGNOSIS — J40 Bronchitis, not specified as acute or chronic: Secondary | ICD-10-CM | POA: Diagnosis not present

## 2018-03-31 ENCOUNTER — Other Ambulatory Visit: Payer: Self-pay | Admitting: Internal Medicine

## 2018-03-31 DIAGNOSIS — J301 Allergic rhinitis due to pollen: Secondary | ICD-10-CM | POA: Diagnosis not present

## 2018-04-07 DIAGNOSIS — J301 Allergic rhinitis due to pollen: Secondary | ICD-10-CM | POA: Diagnosis not present

## 2018-04-10 ENCOUNTER — Ambulatory Visit (INDEPENDENT_AMBULATORY_CARE_PROVIDER_SITE_OTHER): Payer: PPO | Admitting: Internal Medicine

## 2018-04-10 ENCOUNTER — Encounter: Payer: Self-pay | Admitting: Internal Medicine

## 2018-04-10 DIAGNOSIS — K219 Gastro-esophageal reflux disease without esophagitis: Secondary | ICD-10-CM | POA: Diagnosis not present

## 2018-04-10 DIAGNOSIS — E7800 Pure hypercholesterolemia, unspecified: Secondary | ICD-10-CM | POA: Diagnosis not present

## 2018-04-10 DIAGNOSIS — I10 Essential (primary) hypertension: Secondary | ICD-10-CM

## 2018-04-10 DIAGNOSIS — I700 Atherosclerosis of aorta: Secondary | ICD-10-CM

## 2018-04-10 DIAGNOSIS — E119 Type 2 diabetes mellitus without complications: Secondary | ICD-10-CM | POA: Diagnosis not present

## 2018-04-10 DIAGNOSIS — Z9889 Other specified postprocedural states: Secondary | ICD-10-CM | POA: Diagnosis not present

## 2018-04-10 DIAGNOSIS — R05 Cough: Secondary | ICD-10-CM

## 2018-04-10 DIAGNOSIS — F3341 Major depressive disorder, recurrent, in partial remission: Secondary | ICD-10-CM | POA: Diagnosis not present

## 2018-04-10 DIAGNOSIS — E669 Obesity, unspecified: Secondary | ICD-10-CM | POA: Diagnosis not present

## 2018-04-10 DIAGNOSIS — R059 Cough, unspecified: Secondary | ICD-10-CM

## 2018-04-10 MED ORDER — PANTOPRAZOLE SODIUM 40 MG PO TBEC: 40.0000 mg | DELAYED_RELEASE_TABLET | Freq: Every day | ORAL | 2 refills | Status: DC

## 2018-04-10 MED ORDER — LOSARTAN POTASSIUM-HCTZ 50-12.5 MG PO TABS: 1.0000 | ORAL_TABLET | Freq: Every day | ORAL | 3 refills | Status: DC

## 2018-04-10 MED ORDER — PREDNISONE 10 MG PO TABS: ORAL_TABLET | ORAL | 0 refills | Status: DC

## 2018-04-10 NOTE — Patient Instructions (Signed)
Continue flonase daily.    Saline nasal spray - flush nose at least 2-3x/day  Take protonix 40mg  - 30 minutes before your evening meal.    Take the prednisone as directed.

## 2018-04-10 NOTE — Progress Notes (Signed)
Patient ID: MIXTLI RENO, female   DOB: [birth year], 72 y.o.   MRN: [id number]   Subjective:    Patient ID: Erin Camacho, female    DOB: 1946/10/11, 72 y.o.   MRN: [id number]  HPI  Patient here for a scheduled follow up.  She saw Dr Leafy Ro 05/18/51 for f/u lichen sclerosis.  Recommended continuing topical steroids.  Recommended f/u in 6 months.  Saw podiatry 03/19/18 - s/p injection.  Went to acute care 03/27/18 and diagnosed with acute laryngitis, cough and bronchitis.  Treated with zpak, prednisone and tessalon perles.  She reports she is feeling better.  Still with some increased cough.  Worse at night.  Some congestion - right sinus.  Some sore throat.  Using flonase.  Occasional afrin.  No using nebulizer.  Does not feel needs.  No chest pain.  No increased sob.  Some acid reflux.  No nausea or vomiting.  No bowel change.  She does report that since her losartan and hctz are in separate pills now, she feels she is urinating more.  Would like to change back to having both in one tablet.  She does report occasional muscle spasm in her back.  Takes flexeril prn.  Rarely uses.  Recent flare.  Resolved.     Past Medical History:  Diagnosis Date  . Allergy    hay fever  . Anxiety   . Arthritis   . Chronic back pain   . Diabetes mellitus    no meds      pt states she is not diabetic  . GERD (gastroesophageal reflux disease)   . Hyperlipidemia   . Hypertension   . Pneumonia   . Spinal stenosis   . UTI (lower urinary tract infection)    Past Surgical History:  Procedure Laterality Date  . ABDOMINAL HYSTERECTOMY  1973  . ABDOMINAL HYSTERECTOMY    . APPENDECTOMY    . BACK SURGERY  04/17/13  . CHOLECYSTECTOMY  1995  . HERNIA REPAIR  08/2006  . lap band  08/2003  . SEPTOPLASTY  04/1978  . TONSILLECTOMY AND ADENOIDECTOMY  1979  . TOTAL HIP ARTHROPLASTY  02/2012   Right, Dr. Marry Guan   Family History  Problem Relation Age of Onset  . Hypertension Mother   . Stroke Mother   . Heart disease  Mother   . Hypertension Father   . Stroke Father   . Heart disease Father   . Arthritis Sister   . Diabetes Sister   . Dementia Sister   . Cancer Sister   . Diabetes Brother   . Hypertension Brother   . Diabetes Brother   . Hypertension Brother   . Breast cancer Other 47  . Cancer Other        breast  . Diabetes Other   . Kidney disease Neg Hx   . Bladder Cancer Neg Hx    Social History   Socioeconomic History  . Marital status: Married    Spouse name: Not on file  . Number of children: Not on file  . Years of education: Not on file  . Highest education level: Not on file  Occupational History  . Not on file  Social Needs  . Financial resource strain: Not on file  . Food insecurity:    Worry: Not on file    Inability: Not on file  . Transportation needs:    Medical: Not on file    Non-medical: Not on file  Tobacco Use  . Smoking  status: Never Smoker  . Smokeless tobacco: Never Used  Substance and Sexual Activity  . Alcohol use: Yes    Alcohol/week: 0.6 oz    Types: 1 Glasses of wine per week    Comment: occ  . Drug use: No  . Sexual activity: Never  Lifestyle  . Physical activity:    Days per week: Not on file    Minutes per session: Not on file  . Stress: Not on file  Relationships  . Social connections:    Talks on phone: Not on file    Gets together: Not on file    Attends religious service: Not on file    Active member of club or organization: Not on file    Attends meetings of clubs or organizations: Not on file    Relationship status: Not on file  Other Topics Concern  . Not on file  Social History Narrative   Lives in [place] with husband.      Work - retired Sports administrator    Outpatient Encounter Medications as of 04/10/2018  Medication Sig  . albuterol (PROVENTIL) (2.5 MG/3ML) 0.083% nebulizer solution Take 3 mLs (2.5 mg total) by nebulization every 6 (six) hours as needed for wheezing. Please disp with nebulizer machine  . ALPRAZolam  (XANAX) 0.25 MG tablet Take 1 tablet (0.25 mg total) by mouth daily as needed for anxiety.  . Cholecalciferol (VITAMIN D-3) 1000 UNITS CAPS Take 1 capsule by mouth daily.  . cyclobenzaprine (FLEXERIL) 5 MG tablet Take 1 tablet (5 mg total) by mouth 3 (three) times daily as needed for muscle spasms.  . fexofenadine (ALLEGRA) 180 MG tablet Take 1 tablet (180 mg total) by mouth daily.  . fluticasone (FLONASE) 50 MCG/ACT nasal spray INSTILL 2 SPRAYS INTO EACH NOSTRIL EVERY DAY  . losartan-hydrochlorothiazide (HYZAAR) 50-12.5 MG tablet Take 1 tablet by mouth daily.  . metoprolol succinate (TOPROL-XL) 25 MG 24 hr tablet TAKE ONE 1/2 tablet per day.  . Multiple Vitamin (MULTIVITAMIN) tablet Take 1 tablet by mouth daily.  . pantoprazole (PROTONIX) 40 MG tablet Take 1 tablet (40 mg total) by mouth daily.  . predniSONE (DELTASONE) 10 MG tablet Take 4 tablets x 1 day and then decrease by 1/2 tablet per day until down to zero mg.  . sertraline (ZOLOFT) 100 MG tablet TAKE HALF A TABLET (50 MG) BY MOUTH DAILY  . simvastatin (ZOCOR) 10 MG tablet TAKE ONE-HALF TABLET (5 MG TOTAL) BY MOUTH AT BEDTIME.  . traMADol (ULTRAM) 50 MG tablet Take 1 tablet (50 mg total) 3 (three) times daily as needed by mouth.  . traZODone (DESYREL) 100 MG tablet TAKE 1 TABLET BY MOUTH EVERYDAY AT BEDTIME  . [DISCONTINUED] amoxicillin (AMOXIL) 875 MG tablet Take 1 tablet (875 mg total) by mouth 2 (two) times daily.  . [DISCONTINUED] chlorpheniramine-HYDROcodone (TUSSIONEX PENNKINETIC ER) 10-8 MG/5ML SUER Take 5 mLs by mouth at bedtime as needed for cough.  . [DISCONTINUED] hydrochlorothiazide (HYDRODIURIL) 12.5 MG tablet Take 1 tablet (12.5 mg total) by mouth daily.  . [DISCONTINUED] losartan (COZAAR) 50 MG tablet Take 1 tablet (50 mg total) by mouth daily.  . [DISCONTINUED] meloxicam (MOBIC) 7.5 MG tablet Take 7.5 mg by mouth daily.   No facility-administered encounter medications on file as of 04/10/2018.     Review of Systems       Objective:    Physical Exam  BP 136/82 (BP Location: Left Arm, Patient Position: Sitting, Cuff Size: Large)   Pulse 62   Temp 98.4  F (36.9 C) (Oral)   Resp 16   Wt 226 lb (102.5 kg)   SpO2 98%   BMI 37.61 kg/m  Wt Readings from Last 3 Encounters:  04/10/18 226 lb (102.5 kg)  01/08/18 218 lb 6.4 oz (99.1 kg)  11/28/17 222 lb (100.7 kg)     Lab Results  Component Value Date   WBC 4.7 08/28/2017   HGB 13.0 08/28/2017   HCT 39.4 08/28/2017   PLT 192.0 08/28/2017   GLUCOSE 137 (H) 02/07/2018   CHOL 192 02/07/2018   TRIG 73.0 02/07/2018   HDL 79.50 02/07/2018   LDLDIRECT 146.5 09/19/2012   LDLCALC 98 02/07/2018   ALT 15 02/07/2018   AST 19 02/07/2018   NA 142 02/07/2018   K 4.4 02/07/2018   CL 104 02/07/2018   CREATININE 0.87 02/07/2018   BUN 20 02/07/2018   CO2 30 02/07/2018   TSH 1.46 02/07/2018   INR 1.0 11/22/2015   HGBA1C 6.4 02/07/2018   MICROALBUR 1.2 02/14/2017    Mm Digital Screening Bilateral  Result Date: 10/01/2017 CLINICAL DATA:  Screening. EXAM: DIGITAL SCREENING BILATERAL MAMMOGRAM WITH CAD COMPARISON:  Previous exam(s). ACR Breast Density Category b: There are scattered areas of fibroglandular density. FINDINGS: There are no findings suspicious for malignancy. Images were processed with CAD. IMPRESSION: No mammographic evidence of malignancy. A result letter of this screening mammogram will be mailed directly to the patient. RECOMMENDATION: Screening mammogram in one year. (Code:SM-B-01Y) BI-RADS CATEGORY  1: Negative. Electronically Signed   By: Dorise Bullion III M.D   On: 10/01/2017 14:30       Assessment & Plan:   Problem List Items Addressed This Visit    Aortic atherosclerosis ([place])    On simvastatin.       Relevant Medications   losartan-hydrochlorothiazide (HYZAAR) 50-12.5 MG tablet   Cough    Persistent cough and congestion as outlined.  Saline nasal spray and flonase nasal spray as directed.  Prednisone taper as directed.  Treat  acid reflux.  Do not feel abx warranted.  Follow.       Diabetes mellitus type 2, diet-controlled ([place])    Discussed low carb diet and exercise.  States sugars doing well.  Last a1c 6.4.  Prednisone will increased sugar.  Stay hydrated.  Follow.        Relevant Medications   losartan-hydrochlorothiazide (HYZAAR) 50-12.5 MG tablet   Other Relevant Orders   Hemoglobin U2P   Basic metabolic panel   Microalbumin / creatinine urine ratio   Essential (primary) hypertension    Blood pressure under good control.  Continue same medication regimen.  Follow pressures.  Follow metabolic panel.  Discussed her concern regarding hctz.  Will change to combined tablet (losartan/hctz).        Relevant Medications   losartan-hydrochlorothiazide (HYZAAR) 50-12.5 MG tablet   GERD (gastroesophageal reflux disease)    Has noticed some increased acid reflux.  May be aggravating her pulmonary symptoms - cough and congestion.  Start protonix.  Follow.        Relevant Medications   pantoprazole (PROTONIX) 40 MG tablet   Hypercholesterolemia    On simvastatin.  Low cholesterol diet and exercise.  Follow lipid panel and liver function tests.        Relevant Medications   losartan-hydrochlorothiazide (HYZAAR) 50-12.5 MG tablet   Other Relevant Orders   Hepatic function panel   Lipid panel   Major depression    Stable on sertraline.  Follow.  Obesity (BMI 30-39.9)    Discussed diet and exercise.  Follow.        Status post lumbar spine operation    Back is doing better.  Not requiring tramadol.  Occasional muscle spasm.  Takes flexeril rarely.  Follow.  No problem now.            I spent 40 minutes with the patient and more than 50% of the time was spent in consultation regarding the above.  Time spent discussing her current concerns, issues and symptoms.  Time also spent discussing treatment and further evaluation.    [name], MD

## 2018-04-13 ENCOUNTER — Encounter: Payer: Self-pay | Admitting: Internal Medicine

## 2018-04-13 DIAGNOSIS — R05 Cough: Secondary | ICD-10-CM | POA: Insufficient documentation

## 2018-04-13 DIAGNOSIS — R059 Cough, unspecified: Secondary | ICD-10-CM | POA: Insufficient documentation

## 2018-04-13 NOTE — Assessment & Plan Note (Signed)
Back is doing better.  Not requiring tramadol.  Occasional muscle spasm.  Takes flexeril rarely.  Follow.  No problem now.

## 2018-04-13 NOTE — Assessment & Plan Note (Signed)
Has noticed some increased acid reflux.  May be aggravating her pulmonary symptoms - cough and congestion.  Start protonix.  Follow.

## 2018-04-13 NOTE — Assessment & Plan Note (Signed)
Discussed low carb diet and exercise.  States sugars doing well.  Last a1c 6.4.  Prednisone will increased sugar.  Stay hydrated.  Follow.

## 2018-04-13 NOTE — Assessment & Plan Note (Signed)
On simvastatin.   

## 2018-04-13 NOTE — Assessment & Plan Note (Addendum)
Blood pressure under good control.  Continue same medication regimen.  Follow pressures.  Follow metabolic panel.  Discussed her concern regarding hctz.  Will change to combined tablet (losartan/hctz).

## 2018-04-13 NOTE — Assessment & Plan Note (Signed)
Persistent cough and congestion as outlined.  Saline nasal spray and flonase nasal spray as directed.  Prednisone taper as directed.  Treat acid reflux.  Do not feel abx warranted.  Follow.

## 2018-04-13 NOTE — Assessment & Plan Note (Signed)
Stable on sertraline.  Follow.

## 2018-04-13 NOTE — Assessment & Plan Note (Signed)
Discussed diet and exercise.  Follow.  

## 2018-04-13 NOTE — Assessment & Plan Note (Signed)
On simvastatin.  Low cholesterol diet and exercise.  Follow lipid panel and liver function tests.   

## 2018-04-15 DIAGNOSIS — J301 Allergic rhinitis due to pollen: Secondary | ICD-10-CM | POA: Diagnosis not present

## 2018-04-15 DIAGNOSIS — Z96641 Presence of right artificial hip joint: Secondary | ICD-10-CM | POA: Diagnosis not present

## 2018-04-21 DIAGNOSIS — J301 Allergic rhinitis due to pollen: Secondary | ICD-10-CM | POA: Diagnosis not present

## 2018-05-04 DIAGNOSIS — J40 Bronchitis, not specified as acute or chronic: Secondary | ICD-10-CM | POA: Diagnosis not present

## 2018-05-06 ENCOUNTER — Other Ambulatory Visit: Payer: Self-pay | Admitting: Internal Medicine

## 2018-05-12 DIAGNOSIS — J301 Allergic rhinitis due to pollen: Secondary | ICD-10-CM | POA: Diagnosis not present

## 2018-05-19 DIAGNOSIS — J301 Allergic rhinitis due to pollen: Secondary | ICD-10-CM | POA: Diagnosis not present

## 2018-05-27 DIAGNOSIS — J301 Allergic rhinitis due to pollen: Secondary | ICD-10-CM | POA: Diagnosis not present

## 2018-06-02 DIAGNOSIS — J301 Allergic rhinitis due to pollen: Secondary | ICD-10-CM | POA: Diagnosis not present

## 2018-06-04 DIAGNOSIS — L900 Lichen sclerosus et atrophicus: Secondary | ICD-10-CM | POA: Diagnosis not present

## 2018-06-06 DIAGNOSIS — M5416 Radiculopathy, lumbar region: Secondary | ICD-10-CM | POA: Diagnosis not present

## 2018-06-09 ENCOUNTER — Other Ambulatory Visit: Payer: Self-pay | Admitting: Internal Medicine

## 2018-06-09 DIAGNOSIS — Z1283 Encounter for screening for malignant neoplasm of skin: Secondary | ICD-10-CM | POA: Diagnosis not present

## 2018-06-09 DIAGNOSIS — D1800 Hemangioma unspecified site: Secondary | ICD-10-CM | POA: Diagnosis not present

## 2018-06-09 DIAGNOSIS — L821 Other seborrheic keratosis: Secondary | ICD-10-CM | POA: Diagnosis not present

## 2018-06-09 DIAGNOSIS — L570 Actinic keratosis: Secondary | ICD-10-CM | POA: Diagnosis not present

## 2018-06-09 DIAGNOSIS — B353 Tinea pedis: Secondary | ICD-10-CM | POA: Diagnosis not present

## 2018-06-09 DIAGNOSIS — Z872 Personal history of diseases of the skin and subcutaneous tissue: Secondary | ICD-10-CM | POA: Diagnosis not present

## 2018-06-09 DIAGNOSIS — L578 Other skin changes due to chronic exposure to nonionizing radiation: Secondary | ICD-10-CM | POA: Diagnosis not present

## 2018-06-11 DIAGNOSIS — J301 Allergic rhinitis due to pollen: Secondary | ICD-10-CM | POA: Diagnosis not present

## 2018-06-16 ENCOUNTER — Other Ambulatory Visit (INDEPENDENT_AMBULATORY_CARE_PROVIDER_SITE_OTHER): Payer: PPO

## 2018-06-16 DIAGNOSIS — E119 Type 2 diabetes mellitus without complications: Secondary | ICD-10-CM

## 2018-06-16 DIAGNOSIS — E7800 Pure hypercholesterolemia, unspecified: Secondary | ICD-10-CM

## 2018-06-16 DIAGNOSIS — J301 Allergic rhinitis due to pollen: Secondary | ICD-10-CM | POA: Diagnosis not present

## 2018-06-16 LAB — LIPID PANEL
Cholesterol: 172 mg/dL (ref 0–200)
HDL: 71.1 mg/dL
LDL Cholesterol: 89 mg/dL (ref 0–99)
NonHDL: 101.05
Total CHOL/HDL Ratio: 2
Triglycerides: 62 mg/dL (ref 0.0–149.0)
VLDL: 12.4 mg/dL (ref 0.0–40.0)

## 2018-06-16 LAB — HEPATIC FUNCTION PANEL
ALT: 14 U/L (ref 0–35)
AST: 14 U/L (ref 0–37)
Albumin: 3.9 g/dL (ref 3.5–5.2)
Alkaline Phosphatase: 65 U/L (ref 39–117)
Bilirubin, Direct: 0.1 mg/dL (ref 0.0–0.3)
Total Bilirubin: 0.7 mg/dL (ref 0.2–1.2)
Total Protein: 6.5 g/dL (ref 6.0–8.3)

## 2018-06-16 LAB — BASIC METABOLIC PANEL WITH GFR
BUN: 31 mg/dL — ABNORMAL HIGH (ref 6–23)
CO2: 30 meq/L (ref 19–32)
Calcium: 10.1 mg/dL (ref 8.4–10.5)
Chloride: 103 meq/L (ref 96–112)
Creatinine, Ser: 0.96 mg/dL (ref 0.40–1.20)
GFR: 60.53 mL/min
Glucose, Bld: 184 mg/dL — ABNORMAL HIGH (ref 70–99)
Potassium: 4.8 meq/L (ref 3.5–5.1)
Sodium: 140 meq/L (ref 135–145)

## 2018-06-16 LAB — MICROALBUMIN / CREATININE URINE RATIO
Creatinine,U: 154.8 mg/dL
Microalb Creat Ratio: 0.8 mg/g (ref 0.0–30.0)
Microalb, Ur: 1.2 mg/dL (ref 0.0–1.9)

## 2018-06-16 LAB — HEMOGLOBIN A1C: Hgb A1c MFr Bld: 7.1 % — ABNORMAL HIGH (ref 4.6–6.5)

## 2018-06-17 ENCOUNTER — Encounter: Payer: Self-pay | Admitting: Internal Medicine

## 2018-06-17 ENCOUNTER — Ambulatory Visit (INDEPENDENT_AMBULATORY_CARE_PROVIDER_SITE_OTHER): Payer: PPO

## 2018-06-17 ENCOUNTER — Ambulatory Visit (INDEPENDENT_AMBULATORY_CARE_PROVIDER_SITE_OTHER): Payer: PPO | Admitting: Internal Medicine

## 2018-06-17 VITALS — BP 136/80 | HR 58 | Temp 98.2°F | Resp 18 | Wt 221.4 lb

## 2018-06-17 DIAGNOSIS — E7800 Pure hypercholesterolemia, unspecified: Secondary | ICD-10-CM

## 2018-06-17 DIAGNOSIS — Z9889 Other specified postprocedural states: Secondary | ICD-10-CM

## 2018-06-17 DIAGNOSIS — E119 Type 2 diabetes mellitus without complications: Secondary | ICD-10-CM

## 2018-06-17 DIAGNOSIS — I700 Atherosclerosis of aorta: Secondary | ICD-10-CM | POA: Diagnosis not present

## 2018-06-17 DIAGNOSIS — I10 Essential (primary) hypertension: Secondary | ICD-10-CM

## 2018-06-17 DIAGNOSIS — F3341 Major depressive disorder, recurrent, in partial remission: Secondary | ICD-10-CM

## 2018-06-17 DIAGNOSIS — E669 Obesity, unspecified: Secondary | ICD-10-CM

## 2018-06-17 DIAGNOSIS — K219 Gastro-esophageal reflux disease without esophagitis: Secondary | ICD-10-CM

## 2018-06-17 DIAGNOSIS — R0602 Shortness of breath: Secondary | ICD-10-CM | POA: Diagnosis not present

## 2018-06-17 LAB — CBC WITH DIFFERENTIAL/PLATELET
Basophils Absolute: 0 10*3/uL (ref 0.0–0.1)
Basophils Relative: 0.1 % (ref 0.0–3.0)
Eosinophils Absolute: 0 10*3/uL (ref 0.0–0.7)
Eosinophils Relative: 0.2 % (ref 0.0–5.0)
HCT: 41.7 % (ref 36.0–46.0)
Hemoglobin: 13.8 g/dL (ref 12.0–15.0)
Lymphocytes Relative: 16.3 % (ref 12.0–46.0)
Lymphs Abs: 1.5 10*3/uL (ref 0.7–4.0)
MCHC: 33 g/dL (ref 30.0–36.0)
MCV: 91.4 fl (ref 78.0–100.0)
Monocytes Absolute: 0.7 10*3/uL (ref 0.1–1.0)
Monocytes Relative: 8.1 % (ref 3.0–12.0)
Neutro Abs: 6.9 10*3/uL (ref 1.4–7.7)
Neutrophils Relative %: 75.3 % (ref 43.0–77.0)
Platelets: 207 10*3/uL (ref 150.0–400.0)
RBC: 4.56 Mil/uL (ref 3.87–5.11)
RDW: 14.2 % (ref 11.5–15.5)
WBC: 9.2 10*3/uL (ref 4.0–10.5)

## 2018-06-17 LAB — TSH: TSH: 1.65 u[IU]/mL (ref 0.35–4.50)

## 2018-06-17 MED ORDER — PANTOPRAZOLE SODIUM 40 MG PO TBEC: 40.0000 mg | DELAYED_RELEASE_TABLET | Freq: Two times a day (BID) | ORAL | 2 refills | Status: DC

## 2018-06-17 NOTE — Progress Notes (Signed)
Patient ID: Erin Camacho, female   DOB: [birth year], 72 y.o.   MRN: [id number]   Subjective:    Patient ID: Erin Camacho, female    DOB: [birth year], 72 y.o.   MRN: [id number]  HPI  Patient here for a scheduled follow up.  She reports she has not been feeling as well lately.  Reports noticing increased sob with exertion.  Also reports that her legs feel heavy.  Was reevaluated by Dr Ellene Route in the last 1-2 weeks.  Is s/p two injections (L1-L2 - per pt).  States pain is better, but does not feel she can walk as well.  Has been going on since her recent trip.  States she did a lot of walking on her trip.  Has to use her arms to help her stand.  Some upper thigh discomfort at night, but pain is better.  She also reports increased sweating.  Mostly notices at night.  She also reports what she describes as "gas around her heart".  States when she drinks a carbonated beverage - helps.  Increased acid reflux.  No trouble swallowing. No vomiting.  No abdominal pain.  No increased cough or congestion.     Past Medical History:  Diagnosis Date  . Allergy    hay fever  . Anxiety   . Arthritis   . Chronic back pain   . Diabetes mellitus    no meds      pt states she is not diabetic  . GERD (gastroesophageal reflux disease)   . Hyperlipidemia   . Hypertension   . Pneumonia   . Spinal stenosis   . UTI (lower urinary tract infection)    Past Surgical History:  Procedure Laterality Date  . ABDOMINAL HYSTERECTOMY  1973  . ABDOMINAL HYSTERECTOMY    . APPENDECTOMY    . BACK SURGERY  04/17/13  . CHOLECYSTECTOMY  1995  . HERNIA REPAIR  08/2006  . lap band  08/2003  . SEPTOPLASTY  04/1978  . TONSILLECTOMY AND ADENOIDECTOMY  1979  . TOTAL HIP ARTHROPLASTY  02/2012   Right, Dr. Marry Guan   Family History  Problem Relation Age of Onset  . Hypertension Mother   . Stroke Mother   . Heart disease Mother   . Hypertension Father   . Stroke Father   . Heart disease Father   . Arthritis Sister   . Diabetes  Sister   . Dementia Sister   . Cancer Sister   . Diabetes Brother   . Hypertension Brother   . Diabetes Brother   . Hypertension Brother   . Breast cancer Other 47  . Cancer Other        breast  . Diabetes Other   . Kidney disease Neg Hx   . Bladder Cancer Neg Hx    Social History   Socioeconomic History  . Marital status: Married    Spouse name: Not on file  . Number of children: Not on file  . Years of education: Not on file  . Highest education level: Not on file  Occupational History  . Not on file  Social Needs  . Financial resource strain: Not on file  . Food insecurity:    Worry: Not on file    Inability: Not on file  . Transportation needs:    Medical: Not on file    Non-medical: Not on file  Tobacco Use  . Smoking status: Never Smoker  . Smokeless tobacco: Never Used  Substance and Sexual Activity  .  Alcohol use: Yes    Alcohol/week: 0.6 oz    Types: 1 Glasses of wine per week    Comment: occ  . Drug use: No  . Sexual activity: Never  Lifestyle  . Physical activity:    Days per week: Not on file    Minutes per session: Not on file  . Stress: Not on file  Relationships  . Social connections:    Talks on phone: Not on file    Gets together: Not on file    Attends religious service: Not on file    Active member of club or organization: Not on file    Attends meetings of clubs or organizations: Not on file    Relationship status: Not on file  Other Topics Concern  . Not on file  Social History Narrative   Lives in [place] with husband.      Work - retired [employer] administrator    Outpatient Encounter Medications as of 06/17/2018  Medication Sig  . ALPRAZolam (XANAX) 0.25 MG tablet Take 1 tablet (0.25 mg total) by mouth daily as needed for anxiety.  . Cholecalciferol (VITAMIN D-3) 1000 UNITS CAPS Take 1 capsule by mouth daily.  . cyclobenzaprine (FLEXERIL) 5 MG tablet Take 1 tablet (5 mg total) by mouth 3 (three) times daily as needed for muscle  spasms.  . fexofenadine (ALLEGRA) 180 MG tablet Take 1 tablet (180 mg total) by mouth daily.  . fluticasone (FLONASE) 50 MCG/ACT nasal spray INSTILL 2 SPRAYS INTO EACH NOSTRIL EVERY DAY  . losartan-hydrochlorothiazide (HYZAAR) 50-12.5 MG tablet Take 1 tablet by mouth daily.  . metoprolol succinate (TOPROL-XL) 25 MG 24 hr tablet TAKE ONE 1/2 tablet per day.  . Multiple Vitamin (MULTIVITAMIN) tablet Take 1 tablet by mouth daily.  . pantoprazole (PROTONIX) 40 MG tablet Take 1 tablet (40 mg total) by mouth 2 (two) times daily before a meal.  . PROAIR HFA 108 (90 Base) MCG/ACT inhaler INHALE 2 PUFFS BY MOUTH EVERY 6 HOURS AS NEEDED FOR WHEEZE  . sertraline (ZOLOFT) 100 MG tablet TAKE HALF A TABLET (50 MG) BY MOUTH DAILY  . simvastatin (ZOCOR) 10 MG tablet TAKE ONE-HALF TABLET (5 MG TOTAL) BY MOUTH AT BEDTIME.  . traMADol (ULTRAM) 50 MG tablet Take 1 tablet (50 mg total) 3 (three) times daily as needed by mouth.  . traZODone (DESYREL) 100 MG tablet TAKE 1 TABLET BY MOUTH EVERYDAY AT BEDTIME  . [DISCONTINUED] pantoprazole (PROTONIX) 40 MG tablet Take 1 tablet (40 mg total) by mouth daily.  . [DISCONTINUED] albuterol (PROVENTIL) (2.5 MG/3ML) 0.083% nebulizer solution Take 3 mLs (2.5 mg total) by nebulization every 6 (six) hours as needed for wheezing. Please disp with nebulizer machine  . [DISCONTINUED] predniSONE (DELTASONE) 10 MG tablet Take 4 tablets x 1 day and then decrease by 1/2 tablet per day until down to zero mg.   No facility-administered encounter medications on file as of 06/17/2018.     Review of Systems  Constitutional: Positive for fatigue. Negative for fever.  HENT: Negative for congestion and sinus pressure.   Respiratory: Positive for shortness of breath. Negative for cough.   Cardiovascular: Negative for palpitations and leg swelling.       "gas around heart'.    Gastrointestinal: Negative for abdominal pain, diarrhea, nausea and vomiting.       Increased acid reflux.      Genitourinary: Negative for difficulty urinating and dysuria.  Musculoskeletal:       Pain improved after injection.  Legs  feel heavy.    Skin: Negative for color change and rash.  Neurological: Negative for dizziness, light-headedness and headaches.  Psychiatric/Behavioral: Negative for agitation and dysphoric mood.       Objective:    Physical Exam  Constitutional: She appears well-developed and well-nourished. No distress.  HENT:  Nose: Nose normal.  Mouth/Throat: Oropharynx is clear and moist.  Neck: Neck supple. No thyromegaly present.  Cardiovascular: Normal rate and regular rhythm.  Pulmonary/Chest: Breath sounds normal. No respiratory distress. She has no wheezes.  Abdominal: Soft. Bowel sounds are normal. There is no tenderness.  Musculoskeletal: She exhibits no tenderness.  No increased lower extremity swelling.    Lymphadenopathy:    She has no cervical adenopathy.  Skin: No rash noted. No erythema.  Psychiatric: She has a normal mood and affect. Her behavior is normal.    BP 136/80   Pulse (!) 58   Temp 98.2 F (36.8 C) (Oral)   Resp 18   Wt 221 lb 6.4 oz (100.4 kg)   SpO2 99%   BMI 36.84 kg/m  Wt Readings from Last 3 Encounters:  06/17/18 221 lb 6.4 oz (100.4 kg)  04/10/18 226 lb (102.5 kg)  01/08/18 218 lb 6.4 oz (99.1 kg)     Lab Results  Component Value Date   WBC 9.2 06/17/2018   HGB 13.8 06/17/2018   HCT 41.7 06/17/2018   PLT 207.0 06/17/2018   GLUCOSE 184 (H) 06/16/2018   CHOL 172 06/16/2018   TRIG 62.0 06/16/2018   HDL 71.10 06/16/2018   LDLDIRECT 146.5 09/19/2012   LDLCALC 89 06/16/2018   ALT 14 06/16/2018   AST 14 06/16/2018   NA 140 06/16/2018   K 4.8 06/16/2018   CL 103 06/16/2018   CREATININE 0.96 06/16/2018   BUN 31 (H) 06/16/2018   CO2 30 06/16/2018   TSH 1.65 06/17/2018   INR 1.0 11/22/2015   HGBA1C 7.1 (H) 06/16/2018   MICROALBUR 1.2 06/16/2018    Mm Digital Screening Bilateral  Result Date: 10/01/2017 CLINICAL  DATA:  Screening. EXAM: DIGITAL SCREENING BILATERAL MAMMOGRAM WITH CAD COMPARISON:  Previous exam(s). ACR Breast Density Category b: There are scattered areas of fibroglandular density. FINDINGS: There are no findings suspicious for malignancy. Images were processed with CAD. IMPRESSION: No mammographic evidence of malignancy. A result letter of this screening mammogram will be mailed directly to the patient. RECOMMENDATION: Screening mammogram in one year. (Code:SM-B-01Y) BI-RADS CATEGORY  1: Negative. Electronically Signed   By: Dorise Bullion III M.D   On: 10/01/2017 14:30       Assessment & Plan:   Problem List Items Addressed This Visit    Aortic atherosclerosis ([place])    On simvastatin.       Diabetes mellitus type 2, diet-controlled ([place])    Discussed recent lab results.  Discussed increase in a1c.  Given acute issues, will hold on starting a new medication.  Discussed low carb diet.  Follow met b and a1c.        Essential (primary) hypertension    Blood pressure under good control.  Continue same medication regimen.  Follow pressures.  Follow metabolic panel.        GERD (gastroesophageal reflux disease)    On protonix.  Still with acid reflux.  May be aggravating her pulmonary symptoms.  Increase protonix to bid.  Will pursue cardiac evaluation.  May need GI referral if symptoms persist.        Relevant Medications   pantoprazole (PROTONIX) 40 MG tablet  Hypercholesterolemia    On simvastatin.  Low cholesterol diet and exercise.  Follow lipid panel and liver function tests.        Major depression    Stable on sertraline.        Obesity (BMI 30-39.9)    Discussed diet and exercise.  Follow.       SOB (shortness of breath) on exertion - Primary    Describes sob on exertion.  Increased sweating.  Had similar symptoms last year.  Saw Dr Rockey Situ.  Had echo.  Worsening symptoms recently.  EKG - SR/SB with ventricular rate of 51.  No acute ischemic changes when compared to  the previous check.  Will refer back to Dr Rockey Situ for reevaluation and question of need for further cardiac testing given worsening symptoms.  Symptoms could be multifactorial.  Check cxr.  Treat acid reflux as outlined.  Pt comfortable with this plan.  Symptoms have been worsening recently.  No acute change.  Doubt PE.  Hold on CT scan.        Relevant Orders   EKG 12-Lead (Completed)   DG Chest 2 View (Completed)   CBC with Differential/Platelet (Completed)   TSH (Completed)   Ambulatory referral to Cardiology   Status post lumbar spine operation    Recently reevaluated by Dr Ellene Route.  S/p two injections 1-2 weeks ago.  Pain is better.  Describes hard for her to walk.  Legs feel weaker.  Also describes legs as feeling heavy.  Exam as outlined.  No focal deficit.  Concern leg symptoms coming from back.  Get her back in with Dr Ellene Route for further evaluation.  Question of need for f/u MRI.           I spent 45 minutes with the patient and more than 50% of the time was spent in consultation regarding the above.  Time spent obtaining history and discussing her symptoms and concerns.  Time also spent discussing further evaluation and treatment.    [name], MD

## 2018-06-18 ENCOUNTER — Telehealth: Payer: Self-pay | Admitting: Internal Medicine

## 2018-06-18 NOTE — Telephone Encounter (Signed)
Pt given results and documented in result note 

## 2018-06-18 NOTE — Telephone Encounter (Signed)
Patient returning call Copied from [place] [phone]. Topic: Quick Communication - Lab Results >> Jun 18, 2018  9:26 AM Lars Masson, LPN wrote: Called patient to inform them of lab and xray results. When patient returns call, triage nurse may disclose results.

## 2018-06-19 ENCOUNTER — Encounter: Payer: Self-pay | Admitting: Internal Medicine

## 2018-06-19 DIAGNOSIS — R0602 Shortness of breath: Secondary | ICD-10-CM | POA: Insufficient documentation

## 2018-06-19 NOTE — Assessment & Plan Note (Signed)
On protonix.  Still with acid reflux.  May be aggravating her pulmonary symptoms.  Increase protonix to bid.  Will pursue cardiac evaluation.  May need GI referral if symptoms persist.

## 2018-06-19 NOTE — Assessment & Plan Note (Signed)
Blood pressure under good control.  Continue same medication regimen.  Follow pressures.  Follow metabolic panel.   

## 2018-06-19 NOTE — Assessment & Plan Note (Signed)
On simvastatin.  Low cholesterol diet and exercise.  Follow lipid panel and liver function tests.   

## 2018-06-19 NOTE — Assessment & Plan Note (Addendum)
Describes sob on exertion.  Increased sweating.  Had similar symptoms last year.  Saw Dr Rockey Situ.  Had echo.  Worsening symptoms recently.  EKG - SR/SB with ventricular rate of 51.  No acute ischemic changes when compared to the previous check.  Will refer back to Dr Rockey Situ for reevaluation and question of need for further cardiac testing given worsening symptoms.  Symptoms could be multifactorial.  Check cxr.  Treat acid reflux as outlined.  Pt comfortable with this plan.  Symptoms have been worsening recently.  No acute change.  Doubt PE.  Hold on CT scan.

## 2018-06-19 NOTE — Assessment & Plan Note (Signed)
Recently reevaluated by Dr Ellene Route.  S/p two injections 1-2 weeks ago.  Pain is better.  Describes hard for her to walk.  Legs feel weaker.  Also describes legs as feeling heavy.  Exam as outlined.  No focal deficit.  Concern leg symptoms coming from back.  Get her back in with Dr Ellene Route for further evaluation.  Question of need for f/u MRI.

## 2018-06-19 NOTE — Assessment & Plan Note (Signed)
Stable on sertraline.

## 2018-06-19 NOTE — Assessment & Plan Note (Signed)
Discussed recent lab results.  Discussed increase in a1c.  Given acute issues, will hold on starting a new medication.  Discussed low carb diet.  Follow met b and a1c.

## 2018-06-19 NOTE — Assessment & Plan Note (Signed)
Discussed diet and exercise.  Follow.  

## 2018-06-19 NOTE — Assessment & Plan Note (Signed)
On simvastatin.   

## 2018-06-30 DIAGNOSIS — J301 Allergic rhinitis due to pollen: Secondary | ICD-10-CM | POA: Diagnosis not present

## 2018-07-03 DIAGNOSIS — M48061 Spinal stenosis, lumbar region without neurogenic claudication: Secondary | ICD-10-CM | POA: Diagnosis not present

## 2018-07-07 DIAGNOSIS — J301 Allergic rhinitis due to pollen: Secondary | ICD-10-CM | POA: Diagnosis not present

## 2018-07-14 DIAGNOSIS — J301 Allergic rhinitis due to pollen: Secondary | ICD-10-CM | POA: Diagnosis not present

## 2018-07-15 ENCOUNTER — Other Ambulatory Visit: Payer: Self-pay | Admitting: Neurological Surgery

## 2018-07-15 DIAGNOSIS — M48061 Spinal stenosis, lumbar region without neurogenic claudication: Secondary | ICD-10-CM

## 2018-07-15 NOTE — Progress Notes (Deleted)
[facility] Office Note  Date:  07/15/2018   ID:  Erin Camacho, DOB [birth year], MRN [id number]  PCP:  [name], MD   No chief complaint on file.   HPI:  72 year old woman with history of Hypertension Hyperlipidemia Borderline diabetes Non smoker, GERD Anxiety obesity Total hip replacement, 2013 Chronic low back pain 2015, 2017 History of bariatric surgery, LAP band, lost 70 pounds Presents by referral from Dr. Nicki Reaper for evaluation of shortness of breath with exertion  In follow-up she reports that she is sedentary, no regular exercise program Previously used to do water aerobics, water walking Has not done any water exercise for many months since her last back surgery 2017  Does her ADLs If she exerts herself, has some shortness of breath, Bothered by sweating around her head with exertion, worse in the heat Sweating has been going for 2 years, getting worse she feels Exacerbated by hot humid summer  Denies any chest pain on exertion, no shoulder pain, neck pain, back pain  Feels weight has been stable Lost 70 pounds after gastric bypass surgery  Previous imaging studies reviewed with her in detail, images pulled up in the office CT scan lumbar spine Atherosclerotic calcification in the aorta and iliacs without aneurysm. Very mild  Carotid ultrasound July 2018 Less than 39% bilaterally  EKG personally reviewed by myself on todays visit Shows normal sinus rhythm rate 60 bpm left anterior fascicular block  PMH:   has a past medical history of Allergy, Anxiety, Arthritis, Chronic back pain, Diabetes mellitus, GERD (gastroesophageal reflux disease), Hyperlipidemia, Hypertension, Pneumonia, Spinal stenosis, and UTI (lower urinary tract infection).  PSH:    Past Surgical History:  Procedure Laterality Date  . ABDOMINAL HYSTERECTOMY  1973  . ABDOMINAL HYSTERECTOMY    . APPENDECTOMY    . BACK SURGERY  04/17/13  . CHOLECYSTECTOMY  1995  . HERNIA REPAIR   08/2006  . lap band  08/2003  . SEPTOPLASTY  04/1978  . TONSILLECTOMY AND ADENOIDECTOMY  1979  . TOTAL HIP ARTHROPLASTY  02/2012   Right, Dr. Marry Guan    Current Outpatient Medications  Medication Sig Dispense Refill  . ALPRAZolam (XANAX) 0.25 MG tablet Take 1 tablet (0.25 mg total) by mouth daily as needed for anxiety. 30 tablet 0  . Cholecalciferol (VITAMIN D-3) 1000 UNITS CAPS Take 1 capsule by mouth daily.    . cyclobenzaprine (FLEXERIL) 5 MG tablet Take 1 tablet (5 mg total) by mouth 3 (three) times daily as needed for muscle spasms. 30 tablet 1  . fexofenadine (ALLEGRA) 180 MG tablet Take 1 tablet (180 mg total) by mouth daily. 90 tablet 4  . fluticasone (FLONASE) 50 MCG/ACT nasal spray INSTILL 2 SPRAYS INTO EACH NOSTRIL EVERY DAY 16 g 5  . losartan-hydrochlorothiazide (HYZAAR) 50-12.5 MG tablet Take 1 tablet by mouth daily. 30 tablet 3  . metoprolol succinate (TOPROL-XL) 25 MG 24 hr tablet TAKE ONE 1/2 tablet per day. 90 tablet 1  . Multiple Vitamin (MULTIVITAMIN) tablet Take 1 tablet by mouth daily.    . pantoprazole (PROTONIX) 40 MG tablet Take 1 tablet (40 mg total) by mouth 2 (two) times daily before a meal. 30 tablet 2  . PROAIR HFA 108 (90 Base) MCG/ACT inhaler INHALE 2 PUFFS BY MOUTH EVERY 6 HOURS AS NEEDED FOR WHEEZE  12  . sertraline (ZOLOFT) 100 MG tablet TAKE HALF A TABLET (50 MG) BY MOUTH DAILY 45 tablet 2  . simvastatin (ZOCOR) 10 MG tablet TAKE ONE-HALF TABLET (5 MG TOTAL) BY  MOUTH AT BEDTIME. 90 tablet 1  . traMADol (ULTRAM) 50 MG tablet Take 1 tablet (50 mg total) 3 (three) times daily as needed by mouth. 90 tablet 1  . traZODone (DESYREL) 100 MG tablet TAKE 1 TABLET BY MOUTH EVERYDAY AT BEDTIME 90 tablet 0   No current facility-administered medications for this visit.      Allergies:   Patient has no known allergies.   Social History:  The patient  reports that she has never smoked. She has never used smokeless tobacco. She reports that she drinks about 1.0  standard drinks of alcohol per week. She reports that she does not use drugs.   Family History:   family history includes Arthritis in her sister; Breast cancer (age of onset: 89) in her other; Cancer in her other and sister; Dementia in her sister; Diabetes in her brother, brother, other, and sister; Heart disease in her father and mother; Hypertension in her brother, brother, father, and mother; Stroke in her father and mother.    Review of Systems: Review of Systems  Constitutional: Positive for diaphoresis.  Respiratory: Positive for shortness of breath.   Cardiovascular: Negative.   Gastrointestinal: Negative.   Musculoskeletal: Negative.   Neurological: Negative.   Psychiatric/Behavioral: Negative.   All other systems reviewed and are negative.    PHYSICAL EXAM: VS:  There were no vitals taken for this visit. , BMI There is no height or weight on file to calculate BMI. GEN: Well nourished, well developed, in no acute distress, obese  HEENT: normal  Neck: no JVD, carotid bruits, or masses Cardiac: RRR; no murmurs, rubs, or gallops,no edema  Respiratory:  clear to auscultation bilaterally, normal work of breathing GI: soft, nontender, nondistended, + BS MS: no deformity or atrophy  Skin: warm and dry, no rash Neuro:  Strength and sensation are intact Psych: euthymic mood, full affect    Recent Labs: 06/16/2018: ALT 14; BUN 31; Creatinine, Ser 0.96; Potassium 4.8; Sodium 140 06/17/2018: Hemoglobin 13.8; Platelets 207.0; TSH 1.65    Lipid Panel Lab Results  Component Value Date   CHOL 172 06/16/2018   HDL 71.10 06/16/2018   LDLCALC 89 06/16/2018   TRIG 62.0 06/16/2018      Wt Readings from Last 3 Encounters:  06/17/18 221 lb 6.4 oz (100.4 kg)  04/10/18 226 lb (102.5 kg)  01/08/18 218 lb 6.4 oz (99.1 kg)       ASSESSMENT AND PLAN:  Aortic atherosclerosis ([place]) - Plan: EKG 12-Lead CT scan images reviewed with her in the office, minimal aortic atherosclerosis  noted  Shortness of breath - Plan: EKG 12-Lead Suspect shortness of breath is secondary to deconditioning, obesity Given minimal aortic atherosclerosis, minimal carotid calcification, atypical presentation, less likely underlying ischemia Suggested we start with echocardiogram to evaluate cardiac function, right heart pressures, valve disease Long discussion concerning starting exercise program for conditioning and weight loss 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 She does have a friend who will go with her to do water walking. Recommended she try to do this 3-5 times per week If shortness of breath symptoms get worse or if she develops chest tightness with exertion, we would order a stress test. Recommended she call us if this happens We did discuss types of stress testing including nuclear stress test  Essential hypertension - Plan: EKG 12-Lead Blood pressure is well controlled on today's visit. No changes made to the medications.  Hypercholesterolemia Currently on very low-dose simvastatin, cholesterol reasonable If she would like  to be more aggressive we could increase the dose To push LDL well below 70 For now recommended she pursue last modification  Diabetes mellitus type 2, diet-controlled ([place]) Diabetes numbers reviewed with her, previously hemoglobin A1c 6.6 which has improved now down to 6.0. Recommended low carbohydrate diet, exercise, weight loss  Obesity (BMI 30-39.9) We have encouraged continued exercise, careful diet management in an effort to lose weight.   Disposition:   F/U  As needed Long discussion concerning above  Total encounter time more than 60 minutes  Greater than 50% was spent in counseling and coordination of care with the patient    No orders of the defined types were placed in this encounter.    Signed, Esmond Plants, M.D., Ph.D. 07/15/2018  [place], [place]

## 2018-07-16 ENCOUNTER — Ambulatory Visit: Payer: PPO | Admitting: Cardiovascular Disease

## 2018-07-20 ENCOUNTER — Ambulatory Visit
Admission: RE | Admit: 2018-07-20 | Discharge: 2018-07-20 | Disposition: A | Discharge status: Home / Self Care | Payer: PPO | Source: Ambulatory Visit | Attending: Neurological Surgery | Admitting: Neurological Surgery

## 2018-07-20 DIAGNOSIS — M48061 Spinal stenosis, lumbar region without neurogenic claudication: Secondary | ICD-10-CM

## 2018-07-20 MED ORDER — GADOBENATE DIMEGLUMINE 529 MG/ML IV SOLN
20.0000 mL | Freq: Once | INTRAVENOUS | Status: AC | PRN
  Administered 2018-07-20: 20 mL via INTRAVENOUS

## 2018-07-22 DIAGNOSIS — M48062 Spinal stenosis, lumbar region with neurogenic claudication: Secondary | ICD-10-CM | POA: Diagnosis not present

## 2018-07-24 ENCOUNTER — Encounter: Payer: Self-pay | Admitting: Cardiovascular Disease

## 2018-07-24 ENCOUNTER — Encounter

## 2018-07-24 ENCOUNTER — Ambulatory Visit (INDEPENDENT_AMBULATORY_CARE_PROVIDER_SITE_OTHER): Payer: PPO | Admitting: Cardiovascular Disease

## 2018-07-24 VITALS — BP 118/78 | HR 59 | Ht 65.0 in | Wt 222.2 lb

## 2018-07-24 DIAGNOSIS — I10 Essential (primary) hypertension: Secondary | ICD-10-CM | POA: Diagnosis not present

## 2018-07-24 DIAGNOSIS — E7800 Pure hypercholesterolemia, unspecified: Secondary | ICD-10-CM | POA: Diagnosis not present

## 2018-07-24 DIAGNOSIS — E119 Type 2 diabetes mellitus without complications: Secondary | ICD-10-CM

## 2018-07-24 DIAGNOSIS — R0602 Shortness of breath: Secondary | ICD-10-CM | POA: Diagnosis not present

## 2018-07-24 DIAGNOSIS — I700 Atherosclerosis of aorta: Secondary | ICD-10-CM

## 2018-07-24 NOTE — Progress Notes (Signed)
[facility] Office Note  Date:  07/24/2018   ID:  Erin Camacho, DOB 10-Jun-1946, MRN [id number]  PCP:  [name], MD   Chief Complaint  Patient presents with  . OTHER    Sob w/exertion and thigh pain. Pt needing cardiac clearance for back surgery. Meds reviewed verbally with pt.    HPI:  72 year old woman with history of Hypertension Hyperlipidemia Borderline diabetes Non smoker, GERD Anxiety obesity Total hip replacement, 2013 Chronic low back pain 2015, 2017 History of bariatric surgery, LAP band, lost 70 pounds Who presents for follow-up of her shortness of breath with exertion  Weight 220 pounds She feels this is at least 50 pounds above where she would like to be No regular walking program Poor diet, lots of sweets/desserts She does have an area where she can walk outside her house but has not been doing so Walks much better with a shopping cart that is higher rather than lower Breathing stable Denies any chest pain on exertion, no shoulder pain, neck pain, back pain   Worsening back pain,  better after cortisone Leg weakness which troubles her Now participating in physical therapy to get her leg strength up  Scheduled for Surgery with Dr. Kristeen [facility] Neuro spine surgery Low back last back surgery 2017  Previously bothered by sweating around her head with exertion, worse in the heat Exacerbated by hot humid summer  Lost 70 pounds after gastric bypass surgery  Previous imaging studies reviewed CT scan Atherosclerotic calcification in the aorta and iliacs without aneurysm. Very mild  Carotid ultrasound July 2018 Less than 39% bilaterally  EKG personally reviewed by myself on todays visit Shows normal sinus rhythm rate 59 bpm left anterior fascicular block  PMH:   has a past medical history of Allergy, Anxiety, Arthritis, Chronic back pain, Diabetes mellitus, GERD (gastroesophageal reflux disease), Hyperlipidemia, Hypertension, Pneumonia,  Spinal stenosis, and UTI (lower urinary tract infection).  PSH:    Past Surgical History:  Procedure Laterality Date  . ABDOMINAL HYSTERECTOMY  1973  . ABDOMINAL HYSTERECTOMY    . APPENDECTOMY    . BACK SURGERY  04/17/13  . CHOLECYSTECTOMY  1995  . HERNIA REPAIR  08/2006  . lap band  08/2003  . SEPTOPLASTY  04/1978  . TONSILLECTOMY AND ADENOIDECTOMY  1979  . TOTAL HIP ARTHROPLASTY  02/2012   Right, Dr. Marry Guan    Current Outpatient Medications  Medication Sig Dispense Refill  . ALPRAZolam (XANAX) 0.25 MG tablet Take 1 tablet (0.25 mg total) by mouth daily as needed for anxiety. 30 tablet 0  . Cholecalciferol (VITAMIN D-3) 1000 UNITS CAPS Take 1 capsule by mouth daily.    . cyclobenzaprine (FLEXERIL) 5 MG tablet Take 1 tablet (5 mg total) by mouth 3 (three) times daily as needed for muscle spasms. 30 tablet 1  . fexofenadine (ALLEGRA) 180 MG tablet Take 1 tablet (180 mg total) by mouth daily. 90 tablet 4  . fluticasone (FLONASE) 50 MCG/ACT nasal spray INSTILL 2 SPRAYS INTO EACH NOSTRIL EVERY DAY 16 g 5  . losartan-hydrochlorothiazide (HYZAAR) 50-12.5 MG tablet Take 1 tablet by mouth daily. 30 tablet 3  . metoprolol succinate (TOPROL-XL) 25 MG 24 hr tablet TAKE ONE 1/2 tablet per day. 90 tablet 1  . Multiple Vitamin (MULTIVITAMIN) tablet Take 1 tablet by mouth daily.    . pantoprazole (PROTONIX) 40 MG tablet Take 1 tablet (40 mg total) by mouth 2 (two) times daily before a meal. 30 tablet 2  . PROAIR HFA 108 (90  Base) MCG/ACT inhaler INHALE 2 PUFFS BY MOUTH EVERY 6 HOURS AS NEEDED FOR WHEEZE  12  . sertraline (ZOLOFT) 100 MG tablet TAKE HALF A TABLET (50 MG) BY MOUTH DAILY 45 tablet 2  . simvastatin (ZOCOR) 10 MG tablet TAKE ONE-HALF TABLET (5 MG TOTAL) BY MOUTH AT BEDTIME. 90 tablet 1  . traMADol (ULTRAM) 50 MG tablet Take 1 tablet (50 mg total) 3 (three) times daily as needed by mouth. 90 tablet 1  . traZODone (DESYREL) 100 MG tablet TAKE 1 TABLET BY MOUTH EVERYDAY AT BEDTIME 90 tablet  0   No current facility-administered medications for this visit.      Allergies:   Patient has no known allergies.   Social History:  The patient  reports that she has never smoked. She has never used smokeless tobacco. She reports that she drinks about 1.0 standard drinks of alcohol per week. She reports that she does not use drugs.   Family History:   family history includes Arthritis in her sister; Breast cancer (age of onset: 73) in her other; Cancer in her other and sister; Dementia in her sister; Diabetes in her brother, brother, other, and sister; Heart disease in her father and mother; Hypertension in her brother, brother, father, and mother; Stroke in her father and mother.    Review of Systems: Review of Systems  Constitutional: Negative.   Respiratory: Positive for shortness of breath.   Cardiovascular: Negative.   Gastrointestinal: Negative.   Musculoskeletal: Negative.   Neurological: Negative.   Psychiatric/Behavioral: Negative.   All other systems reviewed and are negative.    PHYSICAL EXAM: VS:  BP 118/78 (BP Location: Left Arm, Patient Position: Sitting, Cuff Size: Large)   Pulse (!) 59   Ht 5\' 5"  (1.651 m)   Wt 222 lb 4 oz (100.8 kg)   BMI 36.98 kg/m  , BMI Body mass index is 36.98 kg/m. Constitutional:  oriented to person, place, and time. No distress. Morbidly obese HENT:  Head: Normocephalic and atraumatic.  Eyes:  no discharge. No scleral icterus.  Neck: Normal range of motion. Neck supple. No JVD present.  Cardiovascular: Normal rate, regular rhythm, normal heart sounds and intact distal pulses. Exam reveals no gallop and no friction rub. No edema No murmur heard. Pulmonary/Chest: Effort normal and breath sounds normal. No stridor. No respiratory distress.  no wheezes.  no rales.  no tenderness.  Abdominal: Soft.  no distension.  no tenderness.  Musculoskeletal: Normal range of motion.  no  tenderness or deformity.  Neurological:  normal muscle tone.  Coordination normal. No atrophy Skin: Skin is warm and dry. No rash noted. not diaphoretic.  Psychiatric:  normal mood and affect. behavior is normal. Thought content normal.    Recent Labs: 06/16/2018: ALT 14; BUN 31; Creatinine, Ser 0.96; Potassium 4.8; Sodium 140 06/17/2018: Hemoglobin 13.8; Platelets 207.0; TSH 1.65    Lipid Panel Lab Results  Component Value Date   CHOL 172 06/16/2018   HDL 71.10 06/16/2018   LDLCALC 89 06/16/2018   TRIG 62.0 06/16/2018      Wt Readings from Last 3 Encounters:  07/24/18 222 lb 4 oz (100.8 kg)  06/17/18 221 lb 6.4 oz (100.4 kg)  04/10/18 226 lb (102.5 kg)       ASSESSMENT AND PLAN:  Aortic atherosclerosis ([place]) - Plan: EKG 12-Lead CT scan showing minimal aortic atherosclerosis noted Continue simvastatin  Shortness of breath - Plan: EKG 12-Lead  secondary to deconditioning, obesity Weight continues to run high Poor diet,  of desserts, regular walking or exercise program Given minimal aortic atherosclerosis, minimal carotid calcification, atypical presentation, less likely underlying ischemia Currently getting ready to start physical therapy on her legs.  Has become very deconditioned  Essential hypertension - Plan: EKG 12-Lead Blood pressure is well controlled on today's visit. No changes made to the medications. Stable  Hypercholesterolemia Currently on very low-dose simvastatin, cholesterol reasonable No medication changes made  Diabetes mellitus type 2, diet-controlled ([place]) Long discussion with her concerning the importance to stop eating desserts Hemoglobin A1c has climbed greater than 7  recommended low carbohydrate diet, exercise, weight loss  Should consider meeting with nutritionist  Morbid  obesity We have encouraged  careful diet management in an effort to lose weight. Long discussion concerning need for dietary changes She is not exercising Previously got weight down to the 190s  Disposition:   F/U  As  needed   Total encounter time more than 25 minutes  Greater than 50% was spent in counseling and coordination of care with the patient   Orders Placed This Encounter  Procedures  . EKG 12-Lead     Signed, Esmond Plants, M.D., Ph.D. 07/24/2018  [place], [place]

## 2018-07-24 NOTE — Patient Instructions (Signed)

## 2018-07-25 DIAGNOSIS — J301 Allergic rhinitis due to pollen: Secondary | ICD-10-CM | POA: Diagnosis not present

## 2018-08-01 ENCOUNTER — Other Ambulatory Visit (HOSPITAL_COMMUNITY): Payer: Self-pay | Admitting: Neurological Surgery

## 2018-08-01 DIAGNOSIS — M48062 Spinal stenosis, lumbar region with neurogenic claudication: Secondary | ICD-10-CM

## 2018-08-02 ENCOUNTER — Other Ambulatory Visit: Payer: Self-pay | Admitting: Internal Medicine

## 2018-08-04 ENCOUNTER — Ambulatory Visit: Payer: PPO | Admitting: Cardiovascular Disease

## 2018-08-04 DIAGNOSIS — G8929 Other chronic pain: Secondary | ICD-10-CM | POA: Diagnosis not present

## 2018-08-04 DIAGNOSIS — J301 Allergic rhinitis due to pollen: Secondary | ICD-10-CM | POA: Diagnosis not present

## 2018-08-04 DIAGNOSIS — M5442 Lumbago with sciatica, left side: Secondary | ICD-10-CM | POA: Diagnosis not present

## 2018-08-04 DIAGNOSIS — M5441 Lumbago with sciatica, right side: Secondary | ICD-10-CM | POA: Diagnosis not present

## 2018-08-06 ENCOUNTER — Ambulatory Visit (HOSPITAL_COMMUNITY)
Admission: RE | Admit: 2018-08-06 | Discharge: 2018-08-06 | Disposition: A | Discharge status: Home / Self Care | Payer: PPO | Source: Ambulatory Visit | Attending: Neurological Surgery | Admitting: Neurological Surgery

## 2018-08-06 DIAGNOSIS — Z981 Arthrodesis status: Secondary | ICD-10-CM | POA: Insufficient documentation

## 2018-08-06 DIAGNOSIS — M5442 Lumbago with sciatica, left side: Secondary | ICD-10-CM | POA: Diagnosis not present

## 2018-08-06 DIAGNOSIS — M5124 Other intervertebral disc displacement, thoracic region: Secondary | ICD-10-CM | POA: Insufficient documentation

## 2018-08-06 DIAGNOSIS — M4804 Spinal stenosis, thoracic region: Secondary | ICD-10-CM | POA: Diagnosis not present

## 2018-08-06 DIAGNOSIS — M5136 Other intervertebral disc degeneration, lumbar region: Secondary | ICD-10-CM | POA: Diagnosis not present

## 2018-08-06 DIAGNOSIS — M5126 Other intervertebral disc displacement, lumbar region: Secondary | ICD-10-CM | POA: Diagnosis not present

## 2018-08-06 DIAGNOSIS — G8929 Other chronic pain: Secondary | ICD-10-CM | POA: Diagnosis not present

## 2018-08-06 DIAGNOSIS — M48062 Spinal stenosis, lumbar region with neurogenic claudication: Secondary | ICD-10-CM

## 2018-08-06 DIAGNOSIS — M48061 Spinal stenosis, lumbar region without neurogenic claudication: Secondary | ICD-10-CM | POA: Diagnosis not present

## 2018-08-06 DIAGNOSIS — M5441 Lumbago with sciatica, right side: Secondary | ICD-10-CM | POA: Diagnosis not present

## 2018-08-08 DIAGNOSIS — E119 Type 2 diabetes mellitus without complications: Secondary | ICD-10-CM | POA: Diagnosis not present

## 2018-08-08 LAB — HM DIABETES EYE EXAM

## 2018-08-11 DIAGNOSIS — J301 Allergic rhinitis due to pollen: Secondary | ICD-10-CM | POA: Diagnosis not present

## 2018-08-14 ENCOUNTER — Ambulatory Visit: Payer: PPO | Admitting: Cardiovascular Disease

## 2018-08-18 DIAGNOSIS — J301 Allergic rhinitis due to pollen: Secondary | ICD-10-CM | POA: Diagnosis not present

## 2018-08-21 ENCOUNTER — Encounter: Payer: Self-pay | Admitting: Internal Medicine

## 2018-08-22 ENCOUNTER — Ambulatory Visit (INDEPENDENT_AMBULATORY_CARE_PROVIDER_SITE_OTHER): Payer: PPO | Admitting: Internal Medicine

## 2018-08-22 ENCOUNTER — Encounter: Payer: Self-pay | Admitting: Internal Medicine

## 2018-08-22 ENCOUNTER — Other Ambulatory Visit: Payer: Self-pay | Admitting: Neurological Surgery

## 2018-08-22 DIAGNOSIS — R0602 Shortness of breath: Secondary | ICD-10-CM | POA: Diagnosis not present

## 2018-08-22 DIAGNOSIS — E7800 Pure hypercholesterolemia, unspecified: Secondary | ICD-10-CM | POA: Diagnosis not present

## 2018-08-22 DIAGNOSIS — K219 Gastro-esophageal reflux disease without esophagitis: Secondary | ICD-10-CM | POA: Diagnosis not present

## 2018-08-22 DIAGNOSIS — M545 Low back pain, unspecified: Secondary | ICD-10-CM

## 2018-08-22 DIAGNOSIS — Z1231 Encounter for screening mammogram for malignant neoplasm of breast: Secondary | ICD-10-CM

## 2018-08-22 DIAGNOSIS — F3341 Major depressive disorder, recurrent, in partial remission: Secondary | ICD-10-CM

## 2018-08-22 DIAGNOSIS — Z23 Encounter for immunization: Secondary | ICD-10-CM | POA: Diagnosis not present

## 2018-08-22 DIAGNOSIS — I10 Essential (primary) hypertension: Secondary | ICD-10-CM | POA: Diagnosis not present

## 2018-08-22 DIAGNOSIS — I700 Atherosclerosis of aorta: Secondary | ICD-10-CM

## 2018-08-22 DIAGNOSIS — E119 Type 2 diabetes mellitus without complications: Secondary | ICD-10-CM

## 2018-08-22 DIAGNOSIS — E669 Obesity, unspecified: Secondary | ICD-10-CM

## 2018-08-22 MED ORDER — METFORMIN HCL 500 MG PO TABS: 500.0000 mg | ORAL_TABLET | Freq: Every day | ORAL | 3 refills | Status: DC

## 2018-08-22 MED ORDER — ALPRAZOLAM 0.25 MG PO TABS: 0.2500 mg | ORAL_TABLET | Freq: Every day | ORAL | 0 refills | Status: DC | PRN

## 2018-08-22 NOTE — Progress Notes (Signed)
Patient ID: Erin Camacho, female   DOB: December 12, 1945, 72 y.o.   MRN: [id number]   Subjective:    Patient ID: Erin Camacho, female    DOB: 1946/09/26, 72 y.o.   MRN: [id number]  HPI  Patient here for a scheduled follow up.  Has been having increased back pain.  Seeing Dr Ellene Route.  Have discussed the possibility of surgery (fusion L1-L2).  PT worsened symptoms.  Planning to f/u with Dr Ellene Route to discuss.  Taking tramadol bid prn.  Recently saw Dr Rockey Situ for sob.  His note reviewed.  SOB felt to be related to deconditioning, obesity.  No further cardiac w/up performed.  No increased cough.  No chest pain.  No abdominal pain.  Bowels moving.  No urine change.  a1c 7.1 recently.  Discussed with her today.  Discussed diabetes.  Discussed referral to nutritionist.  Discussed medication - starting metformin.  She has decreased sweets and has been trying to watch her diet better.  Reviewed outside blood sugars.  AM 140-190 and PM sugars 120-160.     Past Medical History:  Diagnosis Date  . Allergy    hay fever  . Anxiety   . Arthritis   . Chronic back pain   . Diabetes mellitus    no meds      pt states she is not diabetic  . GERD (gastroesophageal reflux disease)   . Hyperlipidemia   . Hypertension   . Pneumonia   . Spinal stenosis   . UTI (lower urinary tract infection)    Past Surgical History:  Procedure Laterality Date  . ABDOMINAL HYSTERECTOMY  1973  . ABDOMINAL HYSTERECTOMY    . APPENDECTOMY    . BACK SURGERY  04/17/13  . CHOLECYSTECTOMY  1995  . HERNIA REPAIR  08/2006  . lap band  08/2003  . SEPTOPLASTY  04/1978  . TONSILLECTOMY AND ADENOIDECTOMY  1979  . TOTAL HIP ARTHROPLASTY  02/2012   Right, Dr. Marry Guan   Family History  Problem Relation Age of Onset  . Hypertension Mother   . Stroke Mother   . Heart disease Mother   . Hypertension Father   . Stroke Father   . Heart disease Father   . Arthritis Sister   . Diabetes Sister   . Dementia Sister   . Cancer Sister   .  Diabetes Brother   . Hypertension Brother   . Diabetes Brother   . Hypertension Brother   . Breast cancer Other 47  . Cancer Other        breast  . Diabetes Other   . Kidney disease Neg Hx   . Bladder Cancer Neg Hx    Social History   Socioeconomic History  . Marital status: Married    Spouse name: Not on file  . Number of children: Not on file  . Years of education: Not on file  . Highest education level: Not on file  Occupational History  . Not on file  Social Needs  . Financial resource strain: Not on file  . Food insecurity:    Worry: Not on file    Inability: Not on file  . Transportation needs:    Medical: Not on file    Non-medical: Not on file  Tobacco Use  . Smoking status: Never Smoker  . Smokeless tobacco: Never Used  Substance and Sexual Activity  . Alcohol use: Yes    Alcohol/week: 1.0 standard drinks    Types: 1 Glasses of wine per week  Comment: occ  . Drug use: No  . Sexual activity: Never  Lifestyle  . Physical activity:    Days per week: Not on file    Minutes per session: Not on file  . Stress: Not on file  Relationships  . Social connections:    Talks on phone: Not on file    Gets together: Not on file    Attends religious service: Not on file    Active member of club or organization: Not on file    Attends meetings of clubs or organizations: Not on file    Relationship status: Not on file  Other Topics Concern  . Not on file  Social History Narrative   Lives in [place] with husband.      Work - retired Sports administrator    Outpatient Encounter Medications as of 08/22/2018  Medication Sig  . ALPRAZolam (XANAX) 0.25 MG tablet Take 1 tablet (0.25 mg total) by mouth daily as needed for anxiety.  . Cholecalciferol (VITAMIN D-3) 1000 UNITS CAPS Take 1 capsule by mouth daily.  . cyclobenzaprine (FLEXERIL) 5 MG tablet Take 1 tablet (5 mg total) by mouth 3 (three) times daily as needed for muscle spasms.  . fexofenadine (ALLEGRA) 180 MG  tablet Take 1 tablet (180 mg total) by mouth daily.  . fluticasone (FLONASE) 50 MCG/ACT nasal spray INSTILL 2 SPRAYS INTO EACH NOSTRIL EVERY DAY  . losartan-hydrochlorothiazide (HYZAAR) 50-12.5 MG tablet Take 1 tablet by mouth daily.  . metoprolol succinate (TOPROL-XL) 25 MG 24 hr tablet TAKE ONE 1/2 tablet per day.  . Multiple Vitamin (MULTIVITAMIN) tablet Take 1 tablet by mouth daily.  . pantoprazole (PROTONIX) 40 MG tablet Take 1 tablet (40 mg total) by mouth 2 (two) times daily before a meal.  . PROAIR HFA 108 (90 Base) MCG/ACT inhaler INHALE 2 PUFFS BY MOUTH EVERY 6 HOURS AS NEEDED FOR WHEEZE  . sertraline (ZOLOFT) 100 MG tablet TAKE 1/2 TABLET BY MOUTH (50 MG) BY MOUTH DAILY  . simvastatin (ZOCOR) 10 MG tablet TAKE ONE-HALF TABLET (5 MG TOTAL) BY MOUTH AT BEDTIME.  . traMADol (ULTRAM) 50 MG tablet Take 1 tablet (50 mg total) 3 (three) times daily as needed by mouth.  . traZODone (DESYREL) 100 MG tablet TAKE 1 TABLET BY MOUTH EVERYDAY AT BEDTIME  . [DISCONTINUED] ALPRAZolam (XANAX) 0.25 MG tablet Take 1 tablet (0.25 mg total) by mouth daily as needed for anxiety.  . metFORMIN (GLUCOPHAGE) 500 MG tablet Take 1 tablet (500 mg total) by mouth daily.   No facility-administered encounter medications on file as of 08/22/2018.     Review of Systems  Constitutional: Negative for appetite change and unexpected weight change.  HENT: Negative for congestion and sinus pressure.   Respiratory: Positive for shortness of breath. Negative for cough and chest tightness.   Cardiovascular: Negative for chest pain, palpitations and leg swelling.  Gastrointestinal: Negative for abdominal pain, diarrhea, nausea and vomiting.  Genitourinary: Negative for difficulty urinating and dysuria.  Musculoskeletal: Positive for back pain. Negative for myalgias.  Skin: Negative for color change and rash.  Neurological: Negative for dizziness, light-headedness and headaches.  Psychiatric/Behavioral: Negative for  agitation and dysphoric mood.       Objective:    Physical Exam  Constitutional: She appears well-developed and well-nourished. No distress.  HENT:  Nose: Nose normal.  Mouth/Throat: Oropharynx is clear and moist.  Neck: Neck supple. No thyromegaly present.  Cardiovascular: Normal rate and regular rhythm.  Pulmonary/Chest: Breath sounds normal. No respiratory distress.  She has no wheezes.  Abdominal: Soft. Bowel sounds are normal. There is no tenderness.  Musculoskeletal: She exhibits no edema or tenderness.  Lymphadenopathy:    She has no cervical adenopathy.  Skin: No rash noted. No erythema.  Psychiatric: She has a normal mood and affect. Her behavior is normal.    BP 110/70   Pulse 60   Temp 97.9 F (36.6 C) (Oral)   Resp 17   Ht 5\' 5"  (1.651 m)   Wt 223 lb (101.2 kg)   SpO2 98%   BMI 37.11 kg/m  Wt Readings from Last 3 Encounters:  08/22/18 223 lb (101.2 kg)  07/24/18 222 lb 4 oz (100.8 kg)  06/17/18 221 lb 6.4 oz (100.4 kg)     Lab Results  Component Value Date   WBC 9.2 06/17/2018   HGB 13.8 06/17/2018   HCT 41.7 06/17/2018   PLT 207.0 06/17/2018   GLUCOSE 184 (H) 06/16/2018   CHOL 172 06/16/2018   TRIG 62.0 06/16/2018   HDL 71.10 06/16/2018   LDLDIRECT 146.5 09/19/2012   LDLCALC 89 06/16/2018   ALT 14 06/16/2018   AST 14 06/16/2018   NA 140 06/16/2018   K 4.8 06/16/2018   CL 103 06/16/2018   CREATININE 0.96 06/16/2018   BUN 31 (H) 06/16/2018   CO2 30 06/16/2018   TSH 1.65 06/17/2018   INR 1.0 11/22/2015   HGBA1C 7.1 (H) 06/16/2018   MICROALBUR 1.2 06/16/2018    Ct Thoracic Spine Wo Contrast  Result Date: 08/07/2018 CLINICAL DATA:  Spinal stenosis, lumbar region, with neurogenic claudication EXAM: CT THORACIC SPINE WITHOUT CONTRAST; CT LUMBAR SPINE WITHOUT CONTRAST TECHNIQUE: Multidetector CT images of the thoracic were obtained using the standard protocol without intravenous contrast. Levels of coverage are from the T3-4 interspace through  the mid sacrum. COMPARISON:  Lumbar spine MRI 07/20/2018 FINDINGS: Alignment: Mild L1-2 retrolisthesis.  Fused L5-S1 anterolisthesis. Vertebrae: L2-S1 PLIF with solid arthrodesis. No hardware complication. No acute fracture.  No discitis or aggressive bone lesion Paraspinal and other soft tissues: Lap band at the GE junction. Disc levels: L1-2 severe adjacent segment degeneration with vacuum phenomenon within the disc space that shows endplate sclerosis and irregularity. Extruded calcified disc severely narrows the left L1-2 foramen with tracking superior to the left L2 transverse process in the paravertebral space. Calcified herniation is also seen extending inferiorly from the right subarticular recess into the right L2-3 foramen. Spondylosis and disc narrowing in the thoracic spine. There is a sizable herniation at T7-8 with presumed cord flattening. A smaller herniation with cord contact is present at T8-9. There is a right paracentral to foraminal protrusion at T9-10 without suspected impingement. Dorsal epidural gas at T12 and L1. Epidural gas at the right L2-3 foramen. An ESI was performed at this level July 2019. Question more recent ESI. IMPRESSION: 1. L1-2 severe adjacent segment degeneration with canal and foraminal compression. Calcified disc extrusion fills the left L1-2 foramen and extends along the L2 left transverse process. Right paracentral inferiorly migrating calcified disc extrusion effaces the right subarticular recess and tracks out of the right L2-3 foramen. 2. T7-8 sizable disc protrusion with presumed cord flattening. There is a T8-9 herniation which contacts the cord. 3. L2-S1 PLIF with solid arthrodesis. 4. Epidural gas presumably from epidural injection. Electronically Signed   By: Monte Fantasia M.D.   On: 08/07/2018 09:10   Ct Lumbar Spine Wo Contrast  Result Date: 08/07/2018 CLINICAL DATA:  Spinal stenosis, lumbar region, with neurogenic claudication EXAM: CT  THORACIC SPINE  WITHOUT CONTRAST; CT LUMBAR SPINE WITHOUT CONTRAST TECHNIQUE: Multidetector CT images of the thoracic were obtained using the standard protocol without intravenous contrast. Levels of coverage are from the T3-4 interspace through the mid sacrum. COMPARISON:  Lumbar spine MRI 07/20/2018 FINDINGS: Alignment: Mild L1-2 retrolisthesis.  Fused L5-S1 anterolisthesis. Vertebrae: L2-S1 PLIF with solid arthrodesis. No hardware complication. No acute fracture.  No discitis or aggressive bone lesion Paraspinal and other soft tissues: Lap band at the GE junction. Disc levels: L1-2 severe adjacent segment degeneration with vacuum phenomenon within the disc space that shows endplate sclerosis and irregularity. Extruded calcified disc severely narrows the left L1-2 foramen with tracking superior to the left L2 transverse process in the paravertebral space. Calcified herniation is also seen extending inferiorly from the right subarticular recess into the right L2-3 foramen. Spondylosis and disc narrowing in the thoracic spine. There is a sizable herniation at T7-8 with presumed cord flattening. A smaller herniation with cord contact is present at T8-9. There is a right paracentral to foraminal protrusion at T9-10 without suspected impingement. Dorsal epidural gas at T12 and L1. Epidural gas at the right L2-3 foramen. An ESI was performed at this level July 2019. Question more recent ESI. IMPRESSION: 1. L1-2 severe adjacent segment degeneration with canal and foraminal compression. Calcified disc extrusion fills the left L1-2 foramen and extends along the L2 left transverse process. Right paracentral inferiorly migrating calcified disc extrusion effaces the right subarticular recess and tracks out of the right L2-3 foramen. 2. T7-8 sizable disc protrusion with presumed cord flattening. There is a T8-9 herniation which contacts the cord. 3. L2-S1 PLIF with solid arthrodesis. 4. Epidural gas presumably from epidural injection.  Electronically Signed   By: Monte Fantasia M.D.   On: 08/07/2018 09:10       Assessment & Plan:   Problem List Items Addressed This Visit    Aortic atherosclerosis ([place])    On simvastatin.       Back pain    Seeing Dr Ellene Route.  Plans to f/u to discuss surgery.  Discussed need to get pain meds through his office since following for back pain, etc.        Diabetes mellitus type 2, diet-controlled ([place])    Discussed recent labs.  Discussed low carb diet and exercise. Start metformin.  Refer to lifestyles for diabetes education and diet instruction.        Relevant Medications   metFORMIN (GLUCOPHAGE) 500 MG tablet   Other Relevant Orders   Ambulatory referral to diabetic education   Hemoglobin H0W   Basic metabolic panel   Essential (primary) hypertension    Blood pressure under good control.  Continue same medication regimen.  Follow pressures.  Follow metabolic panel.        GERD (gastroesophageal reflux disease)    Controlled on current regimen.  Follow.        Hypercholesterolemia    On simvastatin.  Low cholesterol diet and exercise.  Follow lipid panel and liver function tests.        Relevant Orders   Hepatic function panel   Lipid panel   Major depression    Stable on sertraline.       Relevant Medications   ALPRAZolam (XANAX) 0.25 MG tablet   Obesity (BMI 30-39.9)    Discussed diet and exercise.  Follow.        Relevant Medications   metFORMIN (GLUCOPHAGE) 500 MG tablet   Shortness of breath    Recently saw  cardiology.  Note reviewed.  No further cardiac w/up warranted.  Acid reflux controlled.  No increased cough or congestion.  CXR recently - no acute abnormality.  Stable.         Other Visit Diagnoses    Breast cancer screening by mammogram       Relevant Orders   MM Digital Screening   Encounter for immunization       Relevant Orders   Flu vaccine HIGH DOSE PF (Completed)       [name], MD

## 2018-08-25 DIAGNOSIS — J301 Allergic rhinitis due to pollen: Secondary | ICD-10-CM | POA: Diagnosis not present

## 2018-08-30 ENCOUNTER — Encounter: Payer: Self-pay | Admitting: Internal Medicine

## 2018-08-30 NOTE — Assessment & Plan Note (Signed)
On simvastatin.  Low cholesterol diet and exercise.  Follow lipid panel and liver function tests.   

## 2018-08-30 NOTE — Assessment & Plan Note (Signed)
Recently saw cardiology.  Note reviewed.  No further cardiac w/up warranted.  Acid reflux controlled.  No increased cough or congestion.  CXR recently - no acute abnormality.  Stable.

## 2018-08-30 NOTE — Assessment & Plan Note (Signed)
Blood pressure under good control.  Continue same medication regimen.  Follow pressures.  Follow metabolic panel.   

## 2018-08-30 NOTE — Assessment & Plan Note (Signed)
Discussed diet and exercise.  Follow.  

## 2018-08-30 NOTE — Assessment & Plan Note (Signed)
Controlled on current regimen.  Follow.  

## 2018-08-30 NOTE — Assessment & Plan Note (Signed)
Stable on sertraline.

## 2018-08-30 NOTE — Assessment & Plan Note (Signed)
On simvastatin.   

## 2018-08-30 NOTE — Assessment & Plan Note (Signed)
Discussed recent labs.  Discussed low carb diet and exercise. Start metformin.  Refer to lifestyles for diabetes education and diet instruction.

## 2018-08-30 NOTE — Assessment & Plan Note (Signed)
Seeing Dr Ellene Route.  Plans to f/u to discuss surgery.  Discussed need to get pain meds through his office since following for back pain, etc.

## 2018-09-01 DIAGNOSIS — J301 Allergic rhinitis due to pollen: Secondary | ICD-10-CM | POA: Diagnosis not present

## 2018-09-02 ENCOUNTER — Other Ambulatory Visit: Payer: Self-pay | Admitting: Internal Medicine

## 2018-09-03 ENCOUNTER — Ambulatory Visit (INDEPENDENT_AMBULATORY_CARE_PROVIDER_SITE_OTHER): Payer: PPO | Admitting: Family Medicine

## 2018-09-03 ENCOUNTER — Encounter: Payer: Self-pay | Admitting: Family Medicine

## 2018-09-03 VITALS — BP 140/80 | HR 73 | Temp 99.0°F | Ht 65.0 in | Wt 222.8 lb

## 2018-09-03 DIAGNOSIS — R0982 Postnasal drip: Secondary | ICD-10-CM

## 2018-09-03 DIAGNOSIS — R05 Cough: Secondary | ICD-10-CM | POA: Diagnosis not present

## 2018-09-03 DIAGNOSIS — R059 Cough, unspecified: Secondary | ICD-10-CM

## 2018-09-03 DIAGNOSIS — J040 Acute laryngitis: Secondary | ICD-10-CM

## 2018-09-03 DIAGNOSIS — J019 Acute sinusitis, unspecified: Secondary | ICD-10-CM | POA: Diagnosis not present

## 2018-09-03 LAB — POC INFLUENZA A&B (BINAX/QUICKVUE)
Influenza A, POC: NEGATIVE
Influenza B, POC: NEGATIVE

## 2018-09-03 MED ORDER — METHYLPREDNISOLONE ACETATE 40 MG/ML IJ SUSP
40.0000 mg | Freq: Once | INTRAMUSCULAR | Status: AC
  Administered 2018-09-03: 40 mg via INTRAMUSCULAR

## 2018-09-03 MED ORDER — AMOXICILLIN-POT CLAVULANATE 875-125 MG PO TABS: 1.0000 | ORAL_TABLET | Freq: Two times a day (BID) | ORAL | 0 refills | Status: DC

## 2018-09-03 MED ORDER — PREDNISONE 5 MG PO TABS: 5.0000 mg | ORAL_TABLET | Freq: Every day | ORAL | 0 refills | Status: AC

## 2018-09-03 MED ORDER — GUAIFENESIN-CODEINE 100-10 MG/5ML PO SOLN: 5.0000 mL | Freq: Four times a day (QID) | ORAL | 0 refills | Status: DC | PRN

## 2018-09-03 NOTE — Progress Notes (Signed)
Subjective:    Patient ID: Erin Camacho, female    DOB: 1946-02-15, 72 y.o.   MRN: [id number]  HPI  Presents to clinic c/o scratchy, raspy voice, sinus pressure, thick nasal drainage that is yellow with some blood at time & possible laryngitis. Symptoms have been present since last week, seeming the worst yesterday and today.   Denies fever or chills. Denies SOB or wheezing.   She has upcoming back surgery and root canal so is concerned about getting better.    Patient Active Problem List   Diagnosis Date Noted  . Shortness of breath 06/19/2018  . Cough 04/13/2018  . Aortic atherosclerosis ([place]) 01/11/2018  . GERD (gastroesophageal reflux disease) 12/23/2016  . Atrophic vaginitis 03/18/2016  . Recurrent UTI 02/20/2016  . Spondylolisthesis at L5-S1 level 12/06/2015  . Preop examination 11/22/2015  . Rhinitis, allergic 11/22/2015  . Right leg pain 07/27/2014  . 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 Bilateral shoulder pain 02/24/2014  . Skin lesion 01/18/2014  . History of bariatric surgery 01/18/2014  . Back pain 05/25/2013  . Major depression 05/14/2013  . Major depressive disorder with single episode 05/14/2013  . Status post lumbar spine operation 04/17/2013  . Healthcare maintenance 01/07/2013  . Hypercholesterolemia 08/22/2012  . HLD (hyperlipidemia) 08/22/2012  . Hypertension 07/07/2012  . Diabetes mellitus type 2, diet-controlled ([place]) 07/07/2012  . Insomnia 07/07/2012  . Obesity (BMI 30-39.9) 07/07/2012  . Essential (primary) hypertension 07/07/2012   Social History   Tobacco Use  . Smoking status: Never Smoker  . Smokeless tobacco: Never Used  Substance Use Topics  . Alcohol use: Yes    Alcohol/week: 1.0 standard drinks    Types: 1 Glasses of wine per week    Comment: occ   Review of Systems  Constitutional: Negative for chills, fatigue and fever.  HENT:  +congestion, ear pain, sinus pain. +scratchy  voice Eyes: Negative.   Respiratory: +cough. Negative for shortness of breath and wheezing.   Cardiovascular: Negative for chest pain, palpitations and leg swelling.  Gastrointestinal: Negative for abdominal pain, diarrhea, nausea and vomiting.  Genitourinary: Negative for dysuria, frequency and urgency.  Musculoskeletal: Negative for arthralgias and myalgias.  Skin: Negative for color change, pallor and rash.  Neurological: Negative for syncope, light-headedness and headaches.  Psychiatric/Behavioral: The patient is not nervous/anxious.       Objective:   Physical Exam  Constitutional: She is oriented to person, place, and time. No distress.  HENT:  Head: Normocephalic and atraumatic.  Nose: Mucosal edema and rhinorrhea present. Right sinus exhibits maxillary sinus tenderness and frontal sinus tenderness. Left sinus exhibits maxillary sinus tenderness and frontal sinus tenderness.  Mouth/Throat: No oropharyngeal exudate or tonsillar abscesses.  +post nasal drip, thick nasal discharge. Voice is very raspy/scratchy.  Cardiovascular: Normal rate, regular rhythm and normal heart sounds.  Pulmonary/Chest: Effort normal and breath sounds normal. No stridor. No respiratory distress. She has no wheezes. She has no rales.  Musculoskeletal: She exhibits no edema.  Gait normal.   Neurological: She is alert and oriented to person, place, and time.  Skin: She is not diaphoretic.  Nursing note and vitals reviewed.  Flu swab is negative    Vitals:   09/03/18 1126  BP: 140/80  Pulse: 73  Temp: 99 F (37.2 C)  SpO2: 96%   Assessment & Plan:    Acute sinusitis -- Patient will take Augmentin BID for 10 days. She will continue allegra and Flonase at home.  She will increase fluids, rest, do good handwashing.  Laryngitis -- Depo Medrol 40mg  IM x1 in clinic. Oral prednisone 5mg  1x daily for 5 days.  Administrations This Visit    methylPREDNISolone acetate (DEPO-MEDROL) injection 40 mg     Admin Date 09/03/2018 Action Given Dose 40 mg Route Intramuscular Administered By Neta Ehlers, RMA         Cough/post nasal drip -- Lungs clear on exam. Suspect cough is related to PND. She will take her regular allergy meds & use cough syrup with codeine as needed to calm cough.  [place] PMP registry checked and is OK for fill of cough syrup with codeine.   Keep regular follow up as planned. Return to clinic sooner if issues arise.

## 2018-09-08 DIAGNOSIS — J301 Allergic rhinitis due to pollen: Secondary | ICD-10-CM | POA: Diagnosis not present

## 2018-09-08 NOTE — Pre-Procedure Instructions (Signed)
ARADHYA SHELLENBARGER  09/08/2018      [id number] - [place], [place] - [address] [place] [address] Phone: [phone] Fax: [phone]    Your procedure is scheduled on Monday October 28th.  Report to [facility] Admitting at 9:00 A.M.  Call this number if you have problems the morning of surgery:  [phone]   Remember:  Do not eat or drink after midnight.    Take these medicines the morning of surgery with A SIP OF WATER  metoprolol succinate (TOPROL-XL) 25 MG 24 hr tablet pantoprazole (PROTONIX) 40 MG tablet sertraline (ZOLOFT) 100 MG tablet traMADol (ULTRAM) 50 MG tablet-if needed ALPRAZolam (XANAX) 0.25 MG tablet- if needed fexofenadine (ALLEGRA) 180 MG tablet-if needed fluticasone (FLONASE) 50 MCG/ACT nasal spray-if needed  7 days prior to surgery STOP taking any Aspirin(unless otherwise instructed by your surgeon), Aleve, Naproxen, Ibuprofen, Motrin, Advil, Goody's, BC's, all herbal medications, fish oil, and all vitamins   HOW TO MANAGE YOUR DIABETES BEFORE AND AFTER SURGERY  Why is it important to control my blood sugar before and after surgery? . Improving blood sugar levels before and after surgery helps healing and can limit problems. . A way of improving blood sugar control is eating a healthy diet by: o  Eating less sugar and carbohydrates o  Increasing activity/exercise o  Talking with your doctor about reaching your blood sugar goals . High blood sugars (greater than 180 mg/dL) can raise your risk of infections and slow your recovery, so you will need to focus on controlling your diabetes during the weeks before surgery. . Make sure that the doctor who takes care of your diabetes knows about your planned surgery including the date and location.  How do I manage my blood sugar before surgery? . Check your blood sugar at least 4 times a day, starting 2 days before surgery, to make sure that the level is not too high or  low. o Check your blood sugar the morning of your surgery when you wake up and every 2 hours until you get to the Short Stay unit. . If your blood sugar is less than 70 mg/dL, you will need to treat for low blood sugar: o Do not take insulin. o Treat a low blood sugar (less than 70 mg/dL) with  cup of clear juice (cranberry or apple), 4 glucose tablets, OR glucose gel. Recheck blood sugar in 15 minutes after treatment (to make sure it is greater than 70 mg/dL). If your blood sugar is not greater than 70 mg/dL on recheck, call [phone] o  for further instructions. . Report your blood sugar to the short stay nurse when you get to Short Stay.  . If you are admitted to the hospital after surgery: o Your blood sugar will be checked by the staff and you will probably be given insulin after surgery (instead of oral diabetes medicines) to make sure you have good blood sugar levels. o The goal for blood sugar control after surgery is 80-180 mg/dL.     WHAT DO I DO ABOUT MY DIABETES MEDICATION?   Marland Kitchen Do not take oral diabetes medicines (pills): metFORMIN (GLUCOPHAGE) tablet the morning of surgery.      Do not wear jewelry, make-up or nail polish.  Do not wear lotions, powders, or perfumes, or deodorant.  Do not shave 48 hours prior to surgery.   Do not bring valuables to the hospital.  [facility] is not responsible  for any belongings or valuables.  Contacts, dentures or bridgework may not be worn into surgery.  Leave your suitcase in the car.  After surgery it may be brought to your room.  For patients admitted to the hospital, discharge time will be determined by your treatment team.  Patients discharged the day of surgery will not be allowed to drive home.    [place]- Preparing For Surgery  Before surgery, you can play an important role. Because skin is not sterile, your skin needs to be as free of germs as possible. You can reduce the number of germs on your skin by washing with  CHG (chlorahexidine gluconate) Soap before surgery.  CHG is an antiseptic cleaner which kills germs and bonds with the skin to continue killing germs even after washing.    Oral Hygiene is also important to reduce your risk of infection.  Remember - BRUSH YOUR TEETH THE MORNING OF SURGERY WITH YOUR REGULAR TOOTHPASTE  Please do not use if you have an allergy to CHG or antibacterial soaps. If your skin becomes reddened/irritated stop using the CHG.  Do not shave (including legs and underarms) for at least 48 hours prior to first CHG shower. It is OK to shave your face.  Please follow these instructions carefully.   1. Shower the NIGHT BEFORE SURGERY and the MORNING OF SURGERY with CHG.   2. If you chose to wash your hair, wash your hair first as usual with your normal shampoo.  3. After you shampoo, rinse your hair and body thoroughly to remove the shampoo.  4. Use CHG as you would any other liquid soap. You can apply CHG directly to the skin and wash gently with a scrungie or a clean washcloth.   5. Apply the CHG Soap to your body ONLY FROM THE NECK DOWN.  Do not use on open wounds or open sores. Avoid contact with your eyes, ears, mouth and genitals (private parts). Wash Face and genitals (private parts)  with your normal soap.  6. Wash thoroughly, paying special attention to the area where your surgery will be performed.  7. Thoroughly rinse your body with warm water from the neck down.  8. DO NOT shower/wash with your normal soap after using and rinsing off the CHG Soap.  9. Pat yourself dry with a CLEAN TOWEL.  10. Wear CLEAN PAJAMAS to bed the night before surgery, wear comfortable clothes the morning of surgery  11. Place CLEAN SHEETS on your bed the night of your first shower and DO NOT SLEEP WITH PETS.    Day of Surgery: Shower as stated above. Do not apply any deodorants/lotions.  Please wear clean clothes to the hospital/surgery center.   Remember to brush your teeth  WITH YOUR REGULAR TOOTHPASTE.   Please read over the following fact sheets that you were given.

## 2018-09-09 ENCOUNTER — Encounter (HOSPITAL_COMMUNITY)
Admission: RE | Admit: 2018-09-09 | Discharge: 2018-09-09 | Disposition: A | Discharge status: Home / Self Care | Payer: PPO | Source: Ambulatory Visit | Attending: Neurological Surgery | Admitting: Neurological Surgery

## 2018-09-09 ENCOUNTER — Encounter (HOSPITAL_COMMUNITY): Payer: Self-pay

## 2018-09-09 ENCOUNTER — Other Ambulatory Visit: Payer: Self-pay

## 2018-09-09 DIAGNOSIS — Z01818 Encounter for other preprocedural examination: Secondary | ICD-10-CM | POA: Diagnosis present

## 2018-09-09 DIAGNOSIS — Z7984 Long term (current) use of oral hypoglycemic drugs: Secondary | ICD-10-CM | POA: Diagnosis not present

## 2018-09-09 DIAGNOSIS — Z79899 Other long term (current) drug therapy: Secondary | ICD-10-CM | POA: Insufficient documentation

## 2018-09-09 DIAGNOSIS — E119 Type 2 diabetes mellitus without complications: Secondary | ICD-10-CM | POA: Insufficient documentation

## 2018-09-09 DIAGNOSIS — M48062 Spinal stenosis, lumbar region with neurogenic claudication: Secondary | ICD-10-CM | POA: Insufficient documentation

## 2018-09-09 HISTORY — DX: Personal history of other diseases of the digestive system: Z87.19

## 2018-09-09 HISTORY — DX: Dyspnea, unspecified: R06.00

## 2018-09-09 LAB — CBC
HCT: 40.9 % (ref 36.0–46.0)
Hemoglobin: 12.9 g/dL (ref 12.0–15.0)
MCH: 29.4 pg (ref 26.0–34.0)
MCHC: 31.5 g/dL (ref 30.0–36.0)
MCV: 93.2 fL (ref 80.0–100.0)
Platelets: 217 10*3/uL (ref 150–400)
RBC: 4.39 MIL/uL (ref 3.87–5.11)
RDW: 13.2 % (ref 11.5–15.5)
WBC: 5.9 10*3/uL (ref 4.0–10.5)
nRBC: 0 % (ref 0.0–0.2)

## 2018-09-09 LAB — BASIC METABOLIC PANEL WITH GFR
Anion gap: 9 (ref 5–15)
BUN: 17 mg/dL (ref 8–23)
CO2: 24 mmol/L (ref 22–32)
Calcium: 10.2 mg/dL (ref 8.9–10.3)
Chloride: 106 mmol/L (ref 98–111)
Creatinine, Ser: 0.8 mg/dL (ref 0.44–1.00)
GFR calc Af Amer: >60 mL/min
GFR calc non Af Amer: >60 mL/min
Glucose, Bld: 129 mg/dL — ABNORMAL HIGH (ref 70–99)
Potassium: 3.4 mmol/L — ABNORMAL LOW (ref 3.5–5.1)
Sodium: 139 mmol/L (ref 135–145)

## 2018-09-09 LAB — SURGICAL PCR SCREEN
MRSA, PCR: NEGATIVE
Staphylococcus aureus: NEGATIVE

## 2018-09-09 LAB — GLUCOSE, CAPILLARY: Glucose-Capillary: 130 mg/dL — ABNORMAL HIGH (ref 70–99)

## 2018-09-09 NOTE — Progress Notes (Signed)
PCP: Dr. [name] @ [facility]  Cardiologist: Dr. Rockey Situ  Fasting sugars 100-120  Pt. Reports she has been sick with previous fever. Continues to have cough. States she has bronchitis/laryngitis . Was on predisione for 5 days, continues on cough syrup and antibiotic for 4 more days.

## 2018-09-09 NOTE — Progress Notes (Addendum)
Anesthesia PAT Evaluation:   Case:  [id number] Date/Time:  09/15/18 1045   Procedures:      Lumbar 1-2 Decompression with add on posterior lumbar interbody fusion, posterior lateral arthrodesis Thoracic 10 to Lumbar 2 with augmented screws/mazor (N/A Back) - Lumbar 1-2 Decompression with add on posterior lumbar interbody fusion, posterior lateral arthrodesis Thoracic 10 to Lumbar 2 with augmented screws/mazor     APPLICATION OF ROBOTIC ASSISTANCE FOR SPINAL PROCEDURE (N/A )   Anesthesia type:  General   Pre-op diagnosis:  Spinal stenosis, Lumbar region with neurogenic claudication   Location:  [facility] OR ROOM 20 / [facility]   Surgeon:  Kristeen Miss, MD      DISCUSSION: Patient is a 72 year old female scheduled for the above procedure.   History includes never smoker, HTN, HLD, dyspnea, GERD, laparoscopic adjustable gastric band procedure (08/20/03, [facility]; revision of Lap-Band port with hiatal hernia repair 08/21/06).  - Seen by [name], NP on 09/03/18 for laryngitis and acute sinusitis. She was given Depo Medrol IM x1 and 5 day course of Prednisone and 10 day course of Augmentin.  Patient seen at 09/09/18 PAT due to recent treatment for URI and laryngitis. Patient still with mild hoarseness and intermittent productive cough, but reports she is doing "much better." No SOB at rest. She can walk, but distance is limited by BLE pain. No fever. Had mild wheezing initially, but now resolved. She is clearing secretions. Denied edema and chest pain. She has ~ 4 more days of Augmentin left.   No additional cardiac testing recommended following recent evaluation by Dr. Rockey Situ.   I left a voice message with [name] at Dr. Clarice Pole office about patient's recent treatment for sinusitis and laryngitis, but currently improving. She has no wheezing or conversational dyspnea on exam. No fever. Her surgery on 09/15/18, so I think that if she continues to improve then she could proceed with surgery. She understands that she  should be well for surgery and was advised to let PCP and surgeon know if any recurrent/worsening symptoms.    Patient wants to make sure anesthesia team is aware: -  that she has a gastric lap band port in place.  - She underwent a nearly 7 hour surgery in the prone position (L2-3, L3-4, L4-5 decompression/PLIF) on 04/17/13. She had to remain intubated post-operatively due to facial swelling. She was extubated at 7:50 PM 04/17/13. She had memory difficulty for a short time afterwards.  - She did well from an anesthesia standpoint with her 12/06/15 L5-S1 fusion (approximately 4 hour surgery). She was extubated post-op.    VS: BP (!) 155/83   Pulse 71   Temp 36.8 C   Resp 18   Ht 5\' 5"  (1.651 m)   Wt 99.7 kg   SpO2 97%   BMI 36.59 kg/m  Patient with mild laryngitis. Lungs clear. No wheezes, crackles, or rhonchi. Heart RRR, no murmur. No carotid bruits noted. No ankle edema.    PROVIDERS: [name], MD is PCP. She is aware of surgery plans.  - [name], MD is cardiologist. He saw patient on 07/24/18 for preoperative evaluation due to exertional dyspnea.  2018 echo showed normal LVEF and diastolic function. He felt dyspnea related to deconditioning and obesity. Given minimal aortic atherosclerosis, minimal carotid calcification, and atypical presentation he felt ischemia less likely. No additional testing ordered.  - Last visit at the [facility] was from 02/23/14. She is interested in getting re-established  her in [place] with [name], MD.     LABS: Labs reviewed: Acceptable for surgery. (all labs ordered are listed, but only abnormal results are displayed)  Labs Reviewed  GLUCOSE, CAPILLARY - Abnormal; Notable for the following components:      Result Value   Glucose-Capillary 130 (*)    All other components within normal limits  BASIC METABOLIC PANEL - Abnormal; Notable for the following components:   Potassium 3.4 (*)    Glucose,  Bld 129 (*)    All other components within normal limits  HEMOGLOBIN A1C - Abnormal; Notable for the following components:   Hgb A1c MFr Bld 6.1 (*)    All other components within normal limits  SURGICAL PCR SCREEN  CBC  TYPE AND SCREEN    IMAGES: CT T/L spine 08/06/18: IMPRESSION: 1. L1-2 severe adjacent segment degeneration with canal and foraminal compression. Calcified disc extrusion fills the left L1-2 foramen and extends along the L2 left transverse process. Right paracentral inferiorly migrating calcified disc extrusion effaces the right subarticular recess and tracks out of the right L2-3 foramen. 2. T7-8 sizable disc protrusion with presumed cord flattening. There is a T8-9 herniation which contacts the cord. 3. L2-S1 PLIF with solid arthrodesis. 4. Epidural gas presumably from epidural injection.  CXR 7/39/19: IMPRESSION: Borderline to mild cardiac enlargement without evidence of pulmonary vascular congestion or pulmonary edema. No acute pneumonia. Thoracic aortic atherosclerosis.   EKG: 07/24/18: SB at 59 bpm. LAFB. LVH with QRS widening.   CV: Echo 08/05/17: Study Conclusions - Left ventricle: The cavity size was mildly dilated. Systolic   function was normal. The estimated ejection fraction was in the   range of 60% to 65%. Wall motion was normal; there were no   regional wall motion abnormalities. Left ventricular diastolic   function parameters were normal. - Left atrium: The atrium was mildly dilated. - Right ventricle: Systolic function was normal. - Pulmonary arteries: Systolic pressure was within the normal   range. Impressions: - Rhythm is sinus bradycardia, rate 49 bpm.  Carotid U/S 05/30/17: Impression: 1-39% stenosis of bilateral internal carotid arteries without evidence of hemodynamic significance.  Reported ETT > 5 years ago at [facility].   Past Medical History:  Diagnosis Date  . Allergy    hay fever  . Anxiety   . Arthritis   . Chronic  back pain   . Diabetes mellitus    no meds      pt states she is not diabetic  . Dyspnea   . GERD (gastroesophageal reflux disease)   . History of hiatal hernia   . Hyperlipidemia   . Hypertension   . Pneumonia   . Spinal stenosis   . UTI (lower urinary tract infection)     Past Surgical History:  Procedure Laterality Date  . ABDOMINAL HYSTERECTOMY  1973  . ABDOMINAL HYSTERECTOMY    . APPENDECTOMY    . BACK SURGERY  04/17/13, 2017  . CHOLECYSTECTOMY  1995  . HERNIA REPAIR  08/2006  . lap band  08/2003  . SEPTOPLASTY  04/1978  . TONSILLECTOMY AND ADENOIDECTOMY  1979  . TOTAL HIP ARTHROPLASTY  02/2012   Right, Dr. Marry Guan    MEDICATIONS: . ALPRAZolam Duanne Moron) 0.25 MG tablet  . amoxicillin-clavulanate (AUGMENTIN) 875-125 MG tablet  . Cholecalciferol (VITAMIN D3) 2000 units TABS  . fexofenadine (ALLEGRA) 180 MG tablet  . fluticasone (FLONASE) 50 MCG/ACT nasal spray  . guaiFENesin-codeine 100-10 MG/5ML syrup  . Liniments (SALONPAS EX)  . losartan-hydrochlorothiazide (HYZAAR)  50-12.5 MG tablet  . metFORMIN (GLUCOPHAGE) 500 MG tablet  . metoprolol succinate (TOPROL-XL) 25 MG 24 hr tablet  . Multiple Vitamin (MULTIVITAMIN) tablet  . pantoprazole (PROTONIX) 40 MG tablet  . sertraline (ZOLOFT) 100 MG tablet  . simvastatin (ZOCOR) 10 MG tablet  . traMADol (ULTRAM) 50 MG tablet  . traZODone (DESYREL) 100 MG tablet   No current facility-administered medications for this encounter.     [facility] Short Stay Center/Anesthesiology Phone [phone] 09/10/2018 9:09 AM

## 2018-09-10 LAB — HEMOGLOBIN A1C
Hgb A1c MFr Bld: 6.1 % — ABNORMAL HIGH (ref 4.8–5.6)
Mean Plasma Glucose: 128 mg/dL

## 2018-09-14 NOTE — Anesthesia Preprocedure Evaluation (Addendum)
Anesthesia Evaluation  Patient identified by MRN, date of birth, ID band Patient awake    Reviewed: Allergy & Precautions, NPO status , Patient's Chart, lab work & pertinent test results  History of Anesthesia Complications Negative for: history of anesthetic complications  Airway Mallampati: I  TM Distance: >3 FB Neck ROM: Full    Dental no notable dental hx. (+) Teeth Intact, Dental Advisory Given   Pulmonary neg pulmonary ROS,    Pulmonary exam normal breath sounds clear to auscultation       Cardiovascular hypertension, Pt. on medications and Pt. on home beta blockers Normal cardiovascular exam Rhythm:Regular Rate:Normal     Neuro/Psych Anxiety Depression Lumbar spinal stenosis    GI/Hepatic Neg liver ROS, GERD  Medicated,  Endo/Other  diabetes, Type 2, Oral Hypoglycemic Agents  Renal/GU negative Renal ROS     Musculoskeletal   Abdominal   Peds  Hematology   Anesthesia Other Findings   Reproductive/Obstetrics                           Anesthesia Physical Anesthesia Plan  ASA: II  Anesthesia Plan: General   Post-op Pain Management:    Induction: Intravenous  PONV Risk Score and Plan: 3 and Ondansetron, Dexamethasone, Treatment may vary due to age or medical condition and Midazolam  Airway Management Planned: Oral ETT  Additional Equipment:   Intra-op Plan:   Post-operative Plan: Extubation in OR  Informed Consent: I have reviewed the patients History and Physical, chart, labs and discussed the procedure including the risks, benefits and alternatives for the proposed anesthesia with the patient or authorized representative who has indicated his/her understanding and acceptance.   Dental advisory given  Plan Discussed with: CRNA and Surgeon  Anesthesia Plan Comments:        Anesthesia Quick Evaluation

## 2018-09-15 ENCOUNTER — Inpatient Hospital Stay (HOSPITAL_COMMUNITY): Payer: PPO

## 2018-09-15 ENCOUNTER — Inpatient Hospital Stay (HOSPITAL_COMMUNITY): Payer: PPO | Admitting: Anesthesiology

## 2018-09-15 ENCOUNTER — Encounter (HOSPITAL_COMMUNITY): Payer: Self-pay | Admitting: *Deleted

## 2018-09-15 ENCOUNTER — Inpatient Hospital Stay (HOSPITAL_COMMUNITY): Payer: PPO | Admitting: Vascular Surgery

## 2018-09-15 ENCOUNTER — Encounter (HOSPITAL_COMMUNITY)
Admission: RE | Disposition: A | Discharge status: Home Health | Payer: Self-pay | Source: Ambulatory Visit | Attending: Neurological Surgery

## 2018-09-15 ENCOUNTER — Inpatient Hospital Stay (HOSPITAL_COMMUNITY)
Admission: RE | Admit: 2018-09-15 | Discharge: 2018-09-19 | DRG: 454 | Disposition: A | Discharge status: Home Health | Payer: PPO | Source: Ambulatory Visit | Attending: Neurological Surgery | Admitting: Neurological Surgery

## 2018-09-15 DIAGNOSIS — Z833 Family history of diabetes mellitus: Secondary | ICD-10-CM | POA: Diagnosis not present

## 2018-09-15 DIAGNOSIS — M4327 Fusion of spine, lumbosacral region: Secondary | ICD-10-CM | POA: Diagnosis not present

## 2018-09-15 DIAGNOSIS — Z9049 Acquired absence of other specified parts of digestive tract: Secondary | ICD-10-CM

## 2018-09-15 DIAGNOSIS — K219 Gastro-esophageal reflux disease without esophagitis: Secondary | ICD-10-CM | POA: Diagnosis present

## 2018-09-15 DIAGNOSIS — M5416 Radiculopathy, lumbar region: Secondary | ICD-10-CM | POA: Diagnosis present

## 2018-09-15 DIAGNOSIS — Z8249 Family history of ischemic heart disease and other diseases of the circulatory system: Secondary | ICD-10-CM | POA: Diagnosis not present

## 2018-09-15 DIAGNOSIS — M4800 Spinal stenosis, site unspecified: Secondary | ICD-10-CM | POA: Diagnosis present

## 2018-09-15 DIAGNOSIS — E119 Type 2 diabetes mellitus without complications: Secondary | ICD-10-CM | POA: Diagnosis present

## 2018-09-15 DIAGNOSIS — Z8261 Family history of arthritis: Secondary | ICD-10-CM | POA: Diagnosis not present

## 2018-09-15 DIAGNOSIS — M4316 Spondylolisthesis, lumbar region: Secondary | ICD-10-CM | POA: Diagnosis not present

## 2018-09-15 DIAGNOSIS — I10 Essential (primary) hypertension: Secondary | ICD-10-CM | POA: Diagnosis present

## 2018-09-15 DIAGNOSIS — G8918 Other acute postprocedural pain: Secondary | ICD-10-CM

## 2018-09-15 DIAGNOSIS — E785 Hyperlipidemia, unspecified: Secondary | ICD-10-CM | POA: Diagnosis present

## 2018-09-15 DIAGNOSIS — R531 Weakness: Secondary | ICD-10-CM | POA: Diagnosis present

## 2018-09-15 DIAGNOSIS — Z96649 Presence of unspecified artificial hip joint: Secondary | ICD-10-CM | POA: Diagnosis present

## 2018-09-15 DIAGNOSIS — Z823 Family history of stroke: Secondary | ICD-10-CM

## 2018-09-15 DIAGNOSIS — Z90710 Acquired absence of both cervix and uterus: Secondary | ICD-10-CM | POA: Diagnosis not present

## 2018-09-15 DIAGNOSIS — M532X6 Spinal instabilities, lumbar region: Secondary | ICD-10-CM | POA: Diagnosis not present

## 2018-09-15 DIAGNOSIS — Z7951 Long term (current) use of inhaled steroids: Secondary | ICD-10-CM | POA: Diagnosis not present

## 2018-09-15 DIAGNOSIS — D62 Acute posthemorrhagic anemia: Secondary | ICD-10-CM | POA: Diagnosis not present

## 2018-09-15 DIAGNOSIS — Z7984 Long term (current) use of oral hypoglycemic drugs: Secondary | ICD-10-CM

## 2018-09-15 DIAGNOSIS — M48062 Spinal stenosis, lumbar region with neurogenic claudication: Principal | ICD-10-CM | POA: Diagnosis present

## 2018-09-15 DIAGNOSIS — Z419 Encounter for procedure for purposes other than remedying health state, unspecified: Secondary | ICD-10-CM

## 2018-09-15 DIAGNOSIS — M4325 Fusion of spine, thoracolumbar region: Secondary | ICD-10-CM | POA: Diagnosis not present

## 2018-09-15 HISTORY — PX: APPLICATION OF ROBOTIC ASSISTANCE FOR SPINAL PROCEDURE: SHX6753

## 2018-09-15 LAB — POCT I-STAT EG7
Acid-base deficit: 4 mmol/L — ABNORMAL HIGH (ref 0.0–2.0)
Bicarbonate: 22.7 mmol/L (ref 20.0–28.0)
Calcium, Ion: 1.28 mmol/L (ref 1.15–1.40)
HCT: 31 % — ABNORMAL LOW (ref 36.0–46.0)
Hemoglobin: 10.5 g/dL — ABNORMAL LOW (ref 12.0–15.0)
O2 Saturation: 45 %
Potassium: 3.7 mmol/L (ref 3.5–5.1)
Sodium: 143 mmol/L (ref 135–145)
TCO2: 24 mmol/L (ref 22–32)
pCO2, Ven: 49.1 mmHg (ref 44.0–60.0)
pH, Ven: 7.273 (ref 7.250–7.430)
pO2, Ven: 28 mmHg — CL (ref 32.0–45.0)

## 2018-09-15 LAB — GLUCOSE, CAPILLARY
Glucose-Capillary: 106 mg/dL — ABNORMAL HIGH (ref 70–99)
Glucose-Capillary: 123 mg/dL — ABNORMAL HIGH (ref 70–99)
Glucose-Capillary: 193 mg/dL — ABNORMAL HIGH (ref 70–99)
Glucose-Capillary: 228 mg/dL — ABNORMAL HIGH (ref 70–99)

## 2018-09-15 LAB — PREPARE RBC (CROSSMATCH)

## 2018-09-15 SURGERY — POSTERIOR LUMBAR FUSION 1 LEVEL
Anesthesia: General | Site: Back

## 2018-09-15 MED ORDER — CEFAZOLIN SODIUM-DEXTROSE 2-4 GM/100ML-% IV SOLN
INTRAVENOUS | Status: AC
  Filled 2018-09-15: qty 100

## 2018-09-15 MED ORDER — THROMBIN 5000 UNITS EX SOLR
CUTANEOUS | Status: AC
  Filled 2018-09-15: qty 5000

## 2018-09-15 MED ORDER — ALPRAZOLAM 0.25 MG PO TABS
0.2500 mg | ORAL_TABLET | Freq: Every day | ORAL | Status: DC | PRN
  Administered 2018-09-17 (×2): 0.25 mg via ORAL
  Filled 2018-09-15 (×2): qty 1

## 2018-09-15 MED ORDER — THROMBIN 20000 UNITS EX SOLR
CUTANEOUS | Status: DC | PRN
  Administered 2018-09-15: 14:00:00 via TOPICAL

## 2018-09-15 MED ORDER — CHLORHEXIDINE GLUCONATE CLOTH 2 % EX PADS: 6.0000 | MEDICATED_PAD | Freq: Once | CUTANEOUS | Status: DC

## 2018-09-15 MED ORDER — MIDAZOLAM HCL 2 MG/2ML IJ SOLN
INTRAMUSCULAR | Status: AC
  Filled 2018-09-15: qty 2

## 2018-09-15 MED ORDER — FLUTICASONE PROPIONATE 50 MCG/ACT NA SUSP
2.0000 | Freq: Every day | NASAL | Status: DC
  Administered 2018-09-16 – 2018-09-18 (×3): 2 via NASAL
  Filled 2018-09-15: qty 16

## 2018-09-15 MED ORDER — METHOCARBAMOL 1000 MG/10ML IJ SOLN
500.0000 mg | Freq: Four times a day (QID) | INTRAVENOUS | Status: DC | PRN
  Filled 2018-09-15: qty 5

## 2018-09-15 MED ORDER — LACTATED RINGERS IV SOLN
INTRAVENOUS | Status: DC
  Administered 2018-09-15: 10:00:00 via INTRAVENOUS

## 2018-09-15 MED ORDER — LIDOCAINE 2% (20 MG/ML) 5 ML SYRINGE
INTRAMUSCULAR | Status: AC
  Filled 2018-09-15: qty 5

## 2018-09-15 MED ORDER — METHOCARBAMOL 500 MG PO TABS
ORAL_TABLET | ORAL | Status: AC
  Filled 2018-09-15: qty 1

## 2018-09-15 MED ORDER — METOPROLOL SUCCINATE ER 25 MG PO TB24
12.5000 mg | ORAL_TABLET | Freq: Every day | ORAL | Status: DC
  Administered 2018-09-16 – 2018-09-19 (×4): 12.5 mg via ORAL
  Filled 2018-09-15 (×4): qty 1

## 2018-09-15 MED ORDER — ONDANSETRON HCL 4 MG/2ML IJ SOLN: 4.0000 mg | Freq: Four times a day (QID) | INTRAMUSCULAR | Status: DC | PRN

## 2018-09-15 MED ORDER — METFORMIN HCL 500 MG PO TABS
500.0000 mg | ORAL_TABLET | Freq: Every day | ORAL | Status: DC
  Administered 2018-09-16 – 2018-09-19 (×3): 500 mg via ORAL
  Filled 2018-09-15 (×3): qty 1

## 2018-09-15 MED ORDER — SODIUM CHLORIDE 0.9% IV SOLUTION: Freq: Once | INTRAVENOUS | Status: DC

## 2018-09-15 MED ORDER — FENTANYL CITRATE (PF) 250 MCG/5ML IJ SOLN
INTRAMUSCULAR | Status: DC | PRN
  Administered 2018-09-15 (×2): 50 ug via INTRAVENOUS
  Administered 2018-09-15: 100 ug via INTRAVENOUS

## 2018-09-15 MED ORDER — SIMVASTATIN 5 MG PO TABS
5.0000 mg | ORAL_TABLET | Freq: Every day | ORAL | Status: DC
  Administered 2018-09-15 – 2018-09-18 (×4): 5 mg via ORAL
  Filled 2018-09-15 (×5): qty 1

## 2018-09-15 MED ORDER — PHENOL 1.4 % MT LIQD: 1.0000 | OROMUCOSAL | Status: DC | PRN

## 2018-09-15 MED ORDER — LIDOCAINE-EPINEPHRINE 1 %-1:100000 IJ SOLN
INTRAMUSCULAR | Status: DC | PRN
  Administered 2018-09-15: 5 mL via INTRADERMAL

## 2018-09-15 MED ORDER — BUPIVACAINE HCL (PF) 0.5 % IJ SOLN
INTRAMUSCULAR | Status: AC
  Filled 2018-09-15: qty 30

## 2018-09-15 MED ORDER — HYDROCHLOROTHIAZIDE 12.5 MG PO CAPS
12.5000 mg | ORAL_CAPSULE | Freq: Every day | ORAL | Status: DC
  Administered 2018-09-16 – 2018-09-19 (×4): 12.5 mg via ORAL
  Filled 2018-09-15 (×4): qty 1

## 2018-09-15 MED ORDER — DEXAMETHASONE SODIUM PHOSPHATE 10 MG/ML IJ SOLN
INTRAMUSCULAR | Status: AC
  Filled 2018-09-15: qty 1

## 2018-09-15 MED ORDER — ACETAMINOPHEN 325 MG PO TABS: 650.0000 mg | ORAL_TABLET | ORAL | Status: DC | PRN

## 2018-09-15 MED ORDER — TRAMADOL HCL 50 MG PO TABS: 50.0000 mg | ORAL_TABLET | Freq: Three times a day (TID) | ORAL | Status: DC | PRN

## 2018-09-15 MED ORDER — PROPOFOL 10 MG/ML IV BOLUS
INTRAVENOUS | Status: AC
  Filled 2018-09-15: qty 20

## 2018-09-15 MED ORDER — PROPOFOL 10 MG/ML IV BOLUS
INTRAVENOUS | Status: DC | PRN
  Administered 2018-09-15: 200 mg via INTRAVENOUS

## 2018-09-15 MED ORDER — INSULIN ASPART 100 UNIT/ML ~~LOC~~ SOLN
0.0000 [IU] | Freq: Three times a day (TID) | SUBCUTANEOUS | Status: DC
  Administered 2018-09-18 – 2018-09-19 (×2): 3 [IU] via SUBCUTANEOUS

## 2018-09-15 MED ORDER — LACTATED RINGERS IV SOLN
INTRAVENOUS | Status: DC
  Administered 2018-09-16: 02:00:00 via INTRAVENOUS

## 2018-09-15 MED ORDER — ROCURONIUM BROMIDE 50 MG/5ML IV SOSY
PREFILLED_SYRINGE | INTRAVENOUS | Status: AC
  Filled 2018-09-15: qty 20

## 2018-09-15 MED ORDER — BISACODYL 10 MG RE SUPP: 10.0000 mg | Freq: Every day | RECTAL | Status: DC | PRN

## 2018-09-15 MED ORDER — THROMBIN (RECOMBINANT) 20000 UNITS EX SOLR
CUTANEOUS | Status: AC
  Filled 2018-09-15: qty 20000

## 2018-09-15 MED ORDER — CEFAZOLIN SODIUM 1 G IJ SOLR
INTRAMUSCULAR | Status: AC
  Filled 2018-09-15: qty 20

## 2018-09-15 MED ORDER — FENTANYL CITRATE (PF) 250 MCG/5ML IJ SOLN
INTRAMUSCULAR | Status: AC
  Filled 2018-09-15: qty 5

## 2018-09-15 MED ORDER — THROMBIN 5000 UNITS EX SOLR
OROMUCOSAL | Status: DC | PRN
  Administered 2018-09-15 (×4): 5 mL via TOPICAL

## 2018-09-15 MED ORDER — ROCURONIUM BROMIDE 50 MG/5ML IV SOSY
PREFILLED_SYRINGE | INTRAVENOUS | Status: AC
  Filled 2018-09-15: qty 10

## 2018-09-15 MED ORDER — GUAIFENESIN-CODEINE 100-10 MG/5ML PO SOLN: 5.0000 mL | Freq: Four times a day (QID) | ORAL | Status: DC | PRN

## 2018-09-15 MED ORDER — OXYCODONE HCL 5 MG PO TABS
5.0000 mg | ORAL_TABLET | Freq: Once | ORAL | Status: AC | PRN
  Administered 2018-09-15: 5 mg via ORAL

## 2018-09-15 MED ORDER — METHOCARBAMOL 500 MG PO TABS
500.0000 mg | ORAL_TABLET | Freq: Four times a day (QID) | ORAL | Status: DC | PRN
  Administered 2018-09-15 – 2018-09-19 (×6): 500 mg via ORAL
  Filled 2018-09-15 (×5): qty 1

## 2018-09-15 MED ORDER — HYDROMORPHONE HCL 1 MG/ML IJ SOLN
0.2500 mg | INTRAMUSCULAR | Status: DC | PRN
  Administered 2018-09-15 (×2): 0.5 mg via INTRAVENOUS

## 2018-09-15 MED ORDER — 0.9 % SODIUM CHLORIDE (POUR BTL) OPTIME
TOPICAL | Status: DC | PRN
  Administered 2018-09-15 (×2): 1000 mL

## 2018-09-15 MED ORDER — PROMETHAZINE HCL 25 MG/ML IJ SOLN: 6.2500 mg | INTRAMUSCULAR | Status: DC | PRN

## 2018-09-15 MED ORDER — OXYCODONE HCL 5 MG PO TABS
ORAL_TABLET | ORAL | Status: AC
  Filled 2018-09-15: qty 1

## 2018-09-15 MED ORDER — SODIUM CHLORIDE 0.9 % IV SOLN: 250.0000 mL | INTRAVENOUS | Status: DC

## 2018-09-15 MED ORDER — SUGAMMADEX SODIUM 200 MG/2ML IV SOLN
INTRAVENOUS | Status: DC | PRN
  Administered 2018-09-15: 500 mg via INTRAVENOUS

## 2018-09-15 MED ORDER — OXYCODONE HCL 5 MG/5ML PO SOLN: 5.0000 mg | Freq: Once | ORAL | Status: AC | PRN

## 2018-09-15 MED ORDER — ACETAMINOPHEN 10 MG/ML IV SOLN
INTRAVENOUS | Status: AC
  Filled 2018-09-15: qty 100

## 2018-09-15 MED ORDER — MENTHOL 3 MG MT LOZG: 1.0000 | LOZENGE | OROMUCOSAL | Status: DC | PRN

## 2018-09-15 MED ORDER — SODIUM CHLORIDE 0.9 % IV SOLN
INTRAVENOUS | Status: DC | PRN
  Administered 2018-09-15: 20 ug/min via INTRAVENOUS

## 2018-09-15 MED ORDER — SENNA 8.6 MG PO TABS
1.0000 | ORAL_TABLET | Freq: Two times a day (BID) | ORAL | Status: DC
  Administered 2018-09-15 – 2018-09-19 (×8): 8.6 mg via ORAL
  Filled 2018-09-15 (×8): qty 1

## 2018-09-15 MED ORDER — DEXAMETHASONE SODIUM PHOSPHATE 10 MG/ML IJ SOLN
INTRAMUSCULAR | Status: DC | PRN
  Administered 2018-09-15: 10 mg via INTRAVENOUS

## 2018-09-15 MED ORDER — LIDOCAINE-EPINEPHRINE 1 %-1:100000 IJ SOLN
INTRAMUSCULAR | Status: AC
  Filled 2018-09-15: qty 1

## 2018-09-15 MED ORDER — CEFAZOLIN SODIUM-DEXTROSE 2-4 GM/100ML-% IV SOLN
2.0000 g | Freq: Three times a day (TID) | INTRAVENOUS | Status: AC
  Administered 2018-09-16 (×2): 2 g via INTRAVENOUS
  Filled 2018-09-15 (×2): qty 100

## 2018-09-15 MED ORDER — SERTRALINE HCL 50 MG PO TABS
50.0000 mg | ORAL_TABLET | Freq: Every day | ORAL | Status: DC
  Administered 2018-09-16 – 2018-09-19 (×4): 50 mg via ORAL
  Filled 2018-09-15 (×4): qty 1

## 2018-09-15 MED ORDER — KETAMINE HCL 10 MG/ML IJ SOLN
INTRAMUSCULAR | Status: DC | PRN
  Administered 2018-09-15 (×3): 10 mg via INTRAVENOUS

## 2018-09-15 MED ORDER — LOSARTAN POTASSIUM 50 MG PO TABS
50.0000 mg | ORAL_TABLET | Freq: Every day | ORAL | Status: DC
  Administered 2018-09-16 – 2018-09-19 (×4): 50 mg via ORAL
  Filled 2018-09-15 (×4): qty 1

## 2018-09-15 MED ORDER — THROMBIN 5000 UNITS EX SOLR
CUTANEOUS | Status: AC
  Filled 2018-09-15: qty 10000

## 2018-09-15 MED ORDER — ONDANSETRON HCL 4 MG PO TABS: 4.0000 mg | ORAL_TABLET | Freq: Four times a day (QID) | ORAL | Status: DC | PRN

## 2018-09-15 MED ORDER — ACETAMINOPHEN 10 MG/ML IV SOLN: 1000.0000 mg | Freq: Once | INTRAVENOUS | Status: DC | PRN

## 2018-09-15 MED ORDER — KETAMINE HCL 50 MG/5ML IJ SOSY
PREFILLED_SYRINGE | INTRAMUSCULAR | Status: AC
  Filled 2018-09-15: qty 5

## 2018-09-15 MED ORDER — MIDAZOLAM HCL 2 MG/2ML IJ SOLN
INTRAMUSCULAR | Status: DC | PRN
  Administered 2018-09-15: 2 mg via INTRAVENOUS

## 2018-09-15 MED ORDER — LOSARTAN POTASSIUM-HCTZ 50-12.5 MG PO TABS: 1.0000 | ORAL_TABLET | Freq: Every day | ORAL | Status: DC

## 2018-09-15 MED ORDER — LACTATED RINGERS IV SOLN
INTRAVENOUS | Status: DC | PRN
  Administered 2018-09-15 (×3): via INTRAVENOUS

## 2018-09-15 MED ORDER — POLYETHYLENE GLYCOL 3350 17 G PO PACK
17.0000 g | PACK | Freq: Every day | ORAL | Status: DC | PRN
  Administered 2018-09-18: 17 g via ORAL
  Filled 2018-09-15: qty 1

## 2018-09-15 MED ORDER — SUGAMMADEX SODIUM 500 MG/5ML IV SOLN
INTRAVENOUS | Status: AC
  Filled 2018-09-15: qty 5

## 2018-09-15 MED ORDER — CEFAZOLIN SODIUM-DEXTROSE 2-4 GM/100ML-% IV SOLN
2.0000 g | INTRAVENOUS | Status: AC
  Administered 2018-09-15 (×2): 2 g via INTRAVENOUS

## 2018-09-15 MED ORDER — OXYCODONE-ACETAMINOPHEN 5-325 MG PO TABS
1.0000 | ORAL_TABLET | ORAL | Status: DC | PRN
  Administered 2018-09-15 – 2018-09-18 (×11): 2 via ORAL
  Administered 2018-09-19: 1 via ORAL
  Administered 2018-09-19 (×2): 2 via ORAL
  Filled 2018-09-15 (×6): qty 2
  Filled 2018-09-15: qty 1
  Filled 2018-09-15 (×8): qty 2

## 2018-09-15 MED ORDER — MORPHINE SULFATE (PF) 2 MG/ML IV SOLN
2.0000 mg | INTRAVENOUS | Status: DC | PRN
  Administered 2018-09-16: 2 mg via INTRAVENOUS
  Filled 2018-09-15: qty 1

## 2018-09-15 MED ORDER — LIDOCAINE 2% (20 MG/ML) 5 ML SYRINGE
INTRAMUSCULAR | Status: DC | PRN
  Administered 2018-09-15: 100 mg via INTRAVENOUS

## 2018-09-15 MED ORDER — ONDANSETRON HCL 4 MG/2ML IJ SOLN
INTRAMUSCULAR | Status: AC
  Filled 2018-09-15: qty 2

## 2018-09-15 MED ORDER — LORATADINE 10 MG PO TABS
10.0000 mg | ORAL_TABLET | Freq: Every day | ORAL | Status: DC
  Administered 2018-09-16 – 2018-09-19 (×4): 10 mg via ORAL
  Filled 2018-09-15 (×4): qty 1

## 2018-09-15 MED ORDER — BUPIVACAINE HCL (PF) 0.5 % IJ SOLN
INTRAMUSCULAR | Status: DC | PRN
  Administered 2018-09-15: 5 mL

## 2018-09-15 MED ORDER — HYDROMORPHONE HCL 1 MG/ML IJ SOLN
INTRAMUSCULAR | Status: AC
  Filled 2018-09-15: qty 1

## 2018-09-15 MED ORDER — DOCUSATE SODIUM 100 MG PO CAPS
100.0000 mg | ORAL_CAPSULE | Freq: Two times a day (BID) | ORAL | Status: DC
  Administered 2018-09-15 – 2018-09-19 (×8): 100 mg via ORAL
  Filled 2018-09-15 (×8): qty 1

## 2018-09-15 MED ORDER — VITAMIN D 1000 UNITS PO TABS
2000.0000 [IU] | ORAL_TABLET | Freq: Every day | ORAL | Status: DC
  Administered 2018-09-16 – 2018-09-19 (×4): 2000 [IU] via ORAL
  Filled 2018-09-15 (×4): qty 2

## 2018-09-15 MED ORDER — TRAZODONE HCL 100 MG PO TABS
100.0000 mg | ORAL_TABLET | Freq: Every day | ORAL | Status: DC
  Administered 2018-09-16 – 2018-09-18 (×3): 100 mg via ORAL
  Filled 2018-09-15 (×4): qty 1

## 2018-09-15 MED ORDER — KETOROLAC TROMETHAMINE 15 MG/ML IJ SOLN
7.5000 mg | Freq: Four times a day (QID) | INTRAMUSCULAR | Status: AC
  Administered 2018-09-15 – 2018-09-16 (×4): 7.5 mg via INTRAVENOUS
  Filled 2018-09-15 (×3): qty 1

## 2018-09-15 MED ORDER — SODIUM CHLORIDE 0.9% FLUSH: 3.0000 mL | INTRAVENOUS | Status: DC | PRN

## 2018-09-15 MED ORDER — KETOROLAC TROMETHAMINE 15 MG/ML IJ SOLN
INTRAMUSCULAR | Status: AC
  Filled 2018-09-15: qty 1

## 2018-09-15 MED ORDER — SODIUM CHLORIDE 0.9 % IV SOLN
INTRAVENOUS | Status: DC | PRN
  Administered 2018-09-15: 14:00:00

## 2018-09-15 MED ORDER — SODIUM CHLORIDE 0.9% FLUSH
3.0000 mL | Freq: Two times a day (BID) | INTRAVENOUS | Status: DC
  Administered 2018-09-15 – 2018-09-19 (×7): 3 mL via INTRAVENOUS

## 2018-09-15 MED ORDER — HYDROCODONE-ACETAMINOPHEN 5-325 MG PO TABS
1.0000 | ORAL_TABLET | ORAL | Status: DC | PRN
  Administered 2018-09-18: 2 via ORAL
  Filled 2018-09-15: qty 2

## 2018-09-15 MED ORDER — ALBUMIN HUMAN 5 % IV SOLN
INTRAVENOUS | Status: DC | PRN
  Administered 2018-09-15 (×4): via INTRAVENOUS

## 2018-09-15 MED ORDER — ROCURONIUM BROMIDE 10 MG/ML (PF) SYRINGE
PREFILLED_SYRINGE | INTRAVENOUS | Status: DC | PRN
  Administered 2018-09-15: 100 mg via INTRAVENOUS
  Administered 2018-09-15 (×2): 25 mg via INTRAVENOUS

## 2018-09-15 MED ORDER — PANTOPRAZOLE SODIUM 40 MG PO TBEC
40.0000 mg | DELAYED_RELEASE_TABLET | Freq: Two times a day (BID) | ORAL | Status: DC
  Administered 2018-09-16 – 2018-09-19 (×6): 40 mg via ORAL
  Filled 2018-09-15 (×6): qty 1

## 2018-09-15 MED ORDER — ACETAMINOPHEN 10 MG/ML IV SOLN
INTRAVENOUS | Status: DC | PRN
  Administered 2018-09-15: 1000 mg via INTRAVENOUS

## 2018-09-15 MED ORDER — FLEET ENEMA 7-19 GM/118ML RE ENEM: 1.0000 | ENEMA | Freq: Once | RECTAL | Status: DC | PRN

## 2018-09-15 MED ORDER — ACETAMINOPHEN 650 MG RE SUPP: 650.0000 mg | RECTAL | Status: DC | PRN

## 2018-09-15 SURGICAL SUPPLY — 81 items
BAG DECANTER FOR FLEXI CONT (MISCELLANEOUS) ×3
BASKET BONE COLLECTION (BASKET) ×3
BIT DRILL LONG 3.0X30 (BIT) ×3
BIT DRILL LONG 3X80 (BIT)
BIT DRILL LONG 4X80 (BIT)
BIT DRILL SHORT 3.0X30 (BIT)
BIT DRILL SHORT 3X80 (BIT)
BLADE SURG 11 STRL SS (BLADE) ×3
BONE CANC CHIPS 20CC PCAN1/4 (Bone Implant) ×3 IMPLANT
BUR MATCHSTICK NEURO 3.0 LAGG (BURR) ×3
CANISTER SUCT 3000ML PPV (MISCELLANEOUS) ×3
CEMENT BONE KYPHX HV R (Orthopedic Implant) ×3 IMPLANT
CHIPS CANC BONE 20CC PCAN1/4 (Bone Implant) ×2 IMPLANT
CONNECTOR LAT CLSD 5.5X90 (Rod) ×6 IMPLANT
CONT SPEC 4OZ CLIKSEAL STRL BL (MISCELLANEOUS) ×3
COVER BACK TABLE 60X90IN (DRAPES) ×3
COVER WAND RF STERILE (DRAPES) ×6
DECANTER SPIKE VIAL GLASS SM (MISCELLANEOUS) ×3
DERMABOND ADVANCED (GAUZE/BANDAGES/DRESSINGS) ×2
DERMABOND ADVANCED .7 DNX12 (GAUZE/BANDAGES/DRESSINGS) ×4
DEVICE INTERBODY ELEVATE 23X7 (Cage) ×6 IMPLANT
DRAPE C-ARM 42X72 X-RAY (DRAPES) ×6
DRAPE HALF SHEET 40X57 (DRAPES)
DRAPE LAPAROTOMY 100X72X124 (DRAPES) ×3
DRAPE SHEET LG 3/4 BI-LAMINATE (DRAPES) ×3
DRAPE WARM FLUID 44X44 (DRAPE) ×3
DRIVER ADAPTER T25 (MISCELLANEOUS) ×24
DRSG OPSITE POSTOP 4X10 (GAUZE/BANDAGES/DRESSINGS) ×3
DURAPREP 26ML APPLICATOR (WOUND CARE) ×3
ELECT BLADE 4.0 EZ CLEAN MEGAD (MISCELLANEOUS)
ELECT REM PT RETURN 9FT ADLT (ELECTROSURGICAL) ×3
GAUZE 4X4 16PLY RFD (DISPOSABLE) ×3
GAUZE SPONGE 4X4 12PLY STRL (GAUZE/BANDAGES/DRESSINGS) ×3
GAUZE SPONGE 4X4 16PLY XRAY LF (GAUZE/BANDAGES/DRESSINGS) ×6
GLOVE BIO SURGEON STRL SZ7.5 (GLOVE) ×3
GLOVE BIOGEL PI IND STRL 7.5 (GLOVE) ×2
GLOVE BIOGEL PI IND STRL 8.5 (GLOVE) ×4
GLOVE BIOGEL PI INDICATOR 7.5 (GLOVE) ×1
GLOVE BIOGEL PI INDICATOR 8.5 (GLOVE) ×2
GLOVE ECLIPSE 8.5 STRL (GLOVE) ×6
GLOVE SURG SS PI 7.0 STRL IVOR (GLOVE) ×6
GOWN STRL REUS W/ TWL LRG LVL3 (GOWN DISPOSABLE) ×4
GOWN STRL REUS W/ TWL XL LVL3 (GOWN DISPOSABLE)
GOWN STRL REUS W/TWL 2XL LVL3 (GOWN DISPOSABLE) ×6
GOWN STRL REUS W/TWL LRG LVL3 (GOWN DISPOSABLE) ×2
GOWN STRL REUS W/TWL XL LVL3 (GOWN DISPOSABLE)
GUIDEWIRE 18IN BLUNT CD HORIZ (WIRE) ×24
HEMOSTAT POWDER KIT SURGIFOAM (HEMOSTASIS) ×12
KIT BASIN OR (CUSTOM PROCEDURE TRAY) ×3
KIT INFUSE MEDIUM (Orthopedic Implant) ×3 IMPLANT
KIT SPINE MAZOR X ROBO DISP (MISCELLANEOUS) ×3
KIT TURNOVER KIT B (KITS) ×3
KYPHON BFD (MISCELLANEOUS) ×9
Kyphon HV-R Bone Cement and Mixer Pack ×3
MILL MEDIUM DISP (BLADE) ×3
NEEDLE HYPO 22GX1.5 SAFETY (NEEDLE) ×3
NS IRRIG 1000ML POUR BTL (IV SOLUTION) ×6
PACK LAMINECTOMY NEURO (CUSTOM PROCEDURE TRAY) ×3
PAD ARMBOARD 7.5X6 YLW CONV (MISCELLANEOUS) ×9
PATTIES SURGICAL .5 X1 (DISPOSABLE) ×3
PIN HEAD 2.5X60MM (PIN)
PLASMABLADE 3.0S (MISCELLANEOUS) ×3
ROD 5.5MM SPINAL SOLERA (Rod) ×3 IMPLANT
SCREW CONNECTOR SET (Screw) ×12 IMPLANT
SCREW FENS SOLERA 5.5X40 (Screw) ×24 IMPLANT
SCREW SCHANZ SA 4.0MM (MISCELLANEOUS)
SCREW SET SOLERA (Screw) ×8 IMPLANT
SCREW SET SOLERA TI5.5 (Screw) ×16 IMPLANT
SPONGE LAP 4X18 RFD (DISPOSABLE)
SPONGE SURGIFOAM ABS GEL 100 (HEMOSTASIS) ×3
SUT PROLENE 6 0 BV (SUTURE)
SUT VIC AB 1 CT1 18XBRD ANBCTR (SUTURE) ×4
SUT VIC AB 1 CT1 8-18 (SUTURE) ×2
SUT VIC AB 2-0 CP2 18 (SUTURE) ×6
SUT VIC AB 3-0 SH 8-18 (SUTURE) ×6
SYR 3ML LL SCALE MARK (SYRINGE) ×15
TOWEL GREEN STERILE (TOWEL DISPOSABLE) ×3
TOWEL GREEN STERILE FF (TOWEL DISPOSABLE) ×3
TRAY FOLEY MTR SLVR 16FR STAT (SET/KITS/TRAYS/PACK) ×3
TUBE MAZOR SA REDUCTION (TUBING) ×3
WATER STERILE IRR 1000ML POUR (IV SOLUTION) ×3

## 2018-09-15 NOTE — Progress Notes (Signed)
Patient made aware of delay. Comfort measures provided.

## 2018-09-15 NOTE — Anesthesia Postprocedure Evaluation (Signed)
Anesthesia Post Note  Patient: Erin Camacho  Procedure(s) Performed: Lumbar One-Two Decompression with add on posterior lumbar interbody fusion, posterior lateral arthrodesis Thoracic Ten to Lumbar Two with augmented screws/mazor (N/A Back) APPLICATION OF ROBOTIC ASSISTANCE FOR SPINAL PROCEDURE (N/A )     Patient location during evaluation: PACU Anesthesia Type: General Level of consciousness: awake and alert Pain management: pain level controlled Vital Signs Assessment: post-procedure vital signs reviewed and stable Respiratory status: spontaneous breathing, nonlabored ventilation, respiratory function stable and patient connected to nasal cannula oxygen Cardiovascular status: blood pressure returned to baseline and stable Postop Assessment: no apparent nausea or vomiting Anesthetic complications: no    Last Vitals:  Vitals:   09/15/18 2000 09/15/18 2033  BP: 135/61 139/71  Pulse: 68 77  Resp: 15 14  Temp:  (!) 36.4 C  SpO2: 96% 94%    Last Pain:  Vitals:   09/15/18 2033  TempSrc: Oral  PainSc:                  Tiajuana Amass

## 2018-09-15 NOTE — Anesthesia Procedure Notes (Signed)
Procedure Name: Intubation Date/Time: 09/15/2018 1:57 PM Performed by: Teressa Lower., CRNA Pre-anesthesia Checklist: Patient identified, Emergency Drugs available, Suction available and Patient being monitored Patient Re-evaluated:Patient Re-evaluated prior to induction Oxygen Delivery Method: Circle system utilized Preoxygenation: Pre-oxygenation with 100% oxygen Induction Type: IV induction Ventilation: Mask ventilation without difficulty and Oral airway inserted - appropriate to patient size Laryngoscope Size: Sabra Heck and 2 Grade View: Grade I Tube type: Oral Tube size: 7.0 mm Number of attempts: 1 Airway Equipment and Method: Stylet and Oral airway Placement Confirmation: ETT inserted through vocal cords under direct vision,  positive ETCO2 and breath sounds checked- equal and bilateral Secured at: 22 cm Tube secured with: Tape Dental Injury: Teeth and Oropharynx as per pre-operative assessment

## 2018-09-15 NOTE — Transfer of Care (Signed)
Immediate Anesthesia Transfer of Care Note  Patient: Erin Camacho  Procedure(s) Performed: Lumbar One-Two Decompression with add on posterior lumbar interbody fusion, posterior lateral arthrodesis Thoracic Ten to Lumbar Two with augmented screws/mazor (N/A Back) APPLICATION OF ROBOTIC ASSISTANCE FOR SPINAL PROCEDURE (N/A )  Patient Location: PACU  Anesthesia Type:General  Level of Consciousness: awake, alert  and oriented  Airway & Oxygen Therapy: Patient Spontanous Breathing and Patient connected to face mask oxygen  Post-op Assessment: Report given to RN and Post -op Vital signs reviewed and stable  Post vital signs: Reviewed and stable  Last Vitals:  Vitals Value Taken Time  BP 162/79 09/15/2018  7:00 PM  Temp    Pulse 78 09/15/2018  7:03 PM  Resp 20 09/15/2018  7:03 PM  SpO2 100 % 09/15/2018  7:03 PM  Vitals shown include unvalidated device data.  Last Pain:  Vitals:   09/15/18 0934  TempSrc:   PainSc: 0-No pain         Complications: No apparent anesthesia complications

## 2018-09-15 NOTE — Op Note (Signed)
Date of surgery: 09/15/2018 Preoperative diagnosis: Lumbar stenosis L1-L2 history of decompression and fusion L2 to sacrum, neurogenic claudication, lumbar radiculopathy Postoperative diagnosis: Same Procedure: Laminectomy and decompression L1-L2 posterior lumbar interbody arthrodesis with expandable peek spacers local autograft allograft and infuse pedicle screw fixation T10-L2 with posterior and posterior lateral arthrodesis using local autograft allograft and infuse Surgeon: Kristeen Miss First assistant: Deatra Ina, MD Anesthesia: General endotracheal Indications: Erin Camacho is a 72 year old individuals had significant back pain with leg weakness and progressive loss of stamina on her feet.  She had developed a high-grade stenosis at L1-L2 with marked instability and retrolisthesis.  She ultimately underwent an MRI that demonstrated high-grade stenosis of the spinal canal.  She was advised regarding the need for surgery.  Procedure: The patient was brought to the operating room supine on the stretcher after the smooth induction of general endotracheal anesthesia, she was turned prone.  The back was prepped with alcohol DuraPrep and draped in a sterile fashion.  The superior portion of the previously made incision was opened and taken down to the lumbodorsal fascia.  Incision was extended cephalad marking 3 cm intervals for each adjacent vertebrae.  This included the opening up to T10.  A subperiosteal dissection was carried out bilaterally and the wound was packed off.  Self-retaining retractors were placed in the wound.  Once an adequate exposure was obtained the robotic arm was then attached to the spinous process of L1 and L2.  Then reference x-rays were obtained with the robotic arm in position and the surgical plan which was made preoperatively was put into place pedicle entry sites were chosen at T10-T11-T12 and L1.  These were drilled secured with a K wire.  Then over each of the K wire a  5.5 x 40 mm screw was placed these were fenestrated screws.  Once all the screws were placed decompression was undertaken by performing a laminectomy of L1 and L2.  Common dural tube was exposed and the underlying disc space was ultimately identified.  After careful examination the disc space was entered through a posterior osteophytic ridge and a discectomy was completed of significantly degenerated residual disc material endplates were curettaged once an appropriate debridement was performed a 7 mm expandable cage was placed on one side this was expanded to the 11 mm height this was repeated on the opposite side along with this a total of 9 cc of bone graft which was autograft allograft and infuse was placed into the interspace.  Once the cages were placed attention was turned to placing methacrylate cement into the vertebrae.  1/2 cc of methacrylate was placed into each screw under fluoroscopic visualization.  No extravasation of the cement was noted.  Once the cement was allowed to harden the screw caps were removed and then the rod was contoured onto each side to fit a side connector to the previous construct below the L2 screw.  The rods were placed in a neutral construct.  They were torqued to their final specifications in a neutral construct and final radiographs were then obtained in AP and lateral projection which demonstrated good decompression of the L1-L2 space along with good stabilization from T10 down to L2.  With this the wound was checked for hemostasis the remainder of the bone graft was placed in the posterior and lateral aspects of the vertebrae and then the retractors were removed and the lumbar and thoracic dorsal fascia were closed with #1 Vicryl interrupted fashion 2-0 Vicryl was used in subtenons tissues  and 3-0 Vicryl subcuticularly.  Blood loss for the entire procedure was estimated at 1500 cc.  During the surgery 2 units of packed cells were given to the patient.

## 2018-09-15 NOTE — H&P (Signed)
Erin Camacho is an 72 y.o. female.   Chief Complaint: Back pain bilateral leg weakness HPI: Erin Camacho is a 72 year old individual whose had extensive spondylosis in the past.  She has had decompression and fusion from L2 to the sacrum.  Recently Erin Camacho notes that she is developed increasing weakness in addition to severe back pain at the thoracolumbar junction and an MRI demonstrates that she has severe stenosis at the L1-L2 level with retrolisthesis of one vertebrae on the other.  She is been advised that surgical intervention to decompress this area would require fixation to the T10 level and she is now admitted to undergo this procedure.  Past Medical History:  Diagnosis Date  . Allergy    hay fever  . Anxiety   . Arthritis   . Chronic back pain   . Diabetes mellitus    no meds      pt states she is not diabetic  . Dyspnea   . GERD (gastroesophageal reflux disease)   . History of hiatal hernia   . Hyperlipidemia   . Hypertension   . Pneumonia   . Spinal stenosis   . UTI (lower urinary tract infection)     Past Surgical History:  Procedure Laterality Date  . ABDOMINAL HYSTERECTOMY  1973  . ABDOMINAL HYSTERECTOMY    . APPENDECTOMY    . BACK SURGERY  04/17/13, 2017  . CHOLECYSTECTOMY  1995  . HERNIA REPAIR  08/2006  . lap band  08/2003  . SEPTOPLASTY  04/1978  . TONSILLECTOMY AND ADENOIDECTOMY  1979  . TOTAL HIP ARTHROPLASTY  02/2012   Right, Dr. Marry Guan    Family History  Problem Relation Age of Onset  . Hypertension Mother   . Stroke Mother   . Heart disease Mother   . Hypertension Father   . Stroke Father   . Heart disease Father   . Arthritis Sister   . Diabetes Sister   . Dementia Sister   . Cancer Sister   . Diabetes Brother   . Hypertension Brother   . Diabetes Brother   . Hypertension Brother   . Breast cancer Other 47  . Cancer Other        breast  . Diabetes Other   . Camacho disease Neg Hx   . Bladder Cancer Neg Hx    Social History:  reports that  she has never smoked. She has never used smokeless tobacco. She reports that she drinks about 1.0 standard drinks of alcohol per week. She reports that she does not use drugs.  Allergies: No Known Allergies  Medications Prior to Admission  Medication Sig Dispense Refill  . ALPRAZolam (XANAX) 0.25 MG tablet Take 1 tablet (0.25 mg total) by mouth daily as needed for anxiety. 30 tablet 0  . Cholecalciferol (VITAMIN D3) 2000 units TABS Take 2,000 Units by mouth daily.    . fexofenadine (ALLEGRA) 180 MG tablet Take 1 tablet (180 mg total) by mouth daily. 90 tablet 4  . fluticasone (FLONASE) 50 MCG/ACT nasal spray INSTILL 2 SPRAYS INTO EACH NOSTRIL EVERY DAY (Patient taking differently: Place 2 sprays into both nostrils at bedtime. ) 16 g 5  . Liniments (SALONPAS EX) Apply 1 application topically daily.    Marland Kitchen losartan-hydrochlorothiazide (HYZAAR) 50-12.5 MG tablet Take 1 tablet by mouth daily. 30 tablet 3  . metFORMIN (GLUCOPHAGE) 500 MG tablet Take 1 tablet (500 mg total) by mouth daily. 30 tablet 3  . metoprolol succinate (TOPROL-XL) 25 MG 24 hr tablet TAKE  ONE 1/2 tablet per day. (Patient taking differently: Take 12.5 mg by mouth daily. ) 90 tablet 1  . Multiple Vitamin (MULTIVITAMIN) tablet Take 1 tablet by mouth daily.    . sertraline (ZOLOFT) 100 MG tablet TAKE 1/2 TABLET BY MOUTH (50 MG) BY MOUTH DAILY (Patient taking differently: Take 50 mg by mouth daily. ) 45 tablet 2  . simvastatin (ZOCOR) 10 MG tablet TAKE ONE-HALF TABLET (5 MG TOTAL) BY MOUTH AT BEDTIME. (Patient taking differently: Take 5 mg by mouth at bedtime. ) 90 tablet 1  . traMADol (ULTRAM) 50 MG tablet Take 1 tablet (50 mg total) 3 (three) times daily as needed by mouth. (Patient taking differently: Take 50 mg by mouth 3 (three) times daily as needed for moderate pain. ) 90 tablet 1  . traZODone (DESYREL) 100 MG tablet TAKE 1 TABLET BY MOUTH EVERYDAY AT BEDTIME (Patient taking differently: Take 100 mg by mouth at bedtime. ) 90 tablet  0  . amoxicillin-clavulanate (AUGMENTIN) 875-125 MG tablet Take 1 tablet by mouth 2 (two) times daily. 20 tablet 0  . guaiFENesin-codeine 100-10 MG/5ML syrup Take 5 mLs by mouth every 6 (six) hours as needed for cough. 120 mL 0  . pantoprazole (PROTONIX) 40 MG tablet TAKE 1 TABLET (40 MG TOTAL) BY MOUTH 2 (TWO) TIMES DAILY BEFORE A MEAL. 90 tablet 0    No results found for this or any previous visit (from the past 48 hour(s)). No results found.  Review of Systems  Constitutional: Negative.   Eyes: Negative.   Respiratory: Negative.   Cardiovascular: Negative.   Gastrointestinal: Negative.   Genitourinary: Negative.   Musculoskeletal: Positive for back pain.  Neurological: Positive for tingling and weakness.  Endo/Heme/Allergies:       History of diabetes with marginal control  Psychiatric/Behavioral: Negative.     Blood pressure (!) 164/83, pulse 67, temperature (!) 97.5 F (36.4 C), temperature source Oral, resp. rate 18, SpO2 98 %. Physical Exam  Constitutional: She is oriented to person, place, and time. She appears well-developed and well-nourished.  HENT:  Head: Normocephalic and atraumatic.  Eyes: Pupils are equal, round, and reactive to light. Conjunctivae and EOM are normal.  Neck: Normal range of motion. Neck supple.  Cardiovascular: Normal rate and regular rhythm.  Respiratory: Effort normal and breath sounds normal.  GI: Soft. Bowel sounds are normal.  Musculoskeletal:  Back pain to palpation and percussion at the thoracolumbar junction.  Straight leg raising is negative bilaterally.  Dix maneuver is negative bilaterally.  Neurological: She is alert and oriented to person, place, and time.  Deep tendon reflexes in the patella and the Achilles bilaterally trace reflexes in the bicep and tricep.  Babinski's are downgoing.  Sensation appears diminished below the level of the knees bilaterally to pin and vibration.  Position sense is intact.  Skin: Skin is warm and dry.   Psychiatric: She has a normal mood and affect. Her behavior is normal. Judgment and thought content normal.     Assessment/Plan Spondylosis and stenosis with retrolisthesis L1-L2.  Decompression and fusion from T10-L2.  Earleen Newport, MD 09/15/2018, 9:17 AM

## 2018-09-16 ENCOUNTER — Other Ambulatory Visit: Payer: Self-pay

## 2018-09-16 LAB — TYPE AND SCREEN
ABO/RH(D): O POS
Antibody Screen: NEGATIVE
Unit division: 0
Unit division: 0

## 2018-09-16 LAB — CBC
HCT: 32.8 % — ABNORMAL LOW (ref 36.0–46.0)
Hemoglobin: 10.6 g/dL — ABNORMAL LOW (ref 12.0–15.0)
MCH: 28.7 pg (ref 26.0–34.0)
MCHC: 32.3 g/dL (ref 30.0–36.0)
MCV: 88.9 fL (ref 80.0–100.0)
Platelets: 146 10*3/uL — ABNORMAL LOW (ref 150–400)
RBC: 3.69 MIL/uL — ABNORMAL LOW (ref 3.87–5.11)
RDW: 15.4 % (ref 11.5–15.5)
WBC: 11.8 10*3/uL — ABNORMAL HIGH (ref 4.0–10.5)
nRBC: 0 % (ref 0.0–0.2)

## 2018-09-16 LAB — BASIC METABOLIC PANEL WITH GFR
Anion gap: 8 (ref 5–15)
BUN: 22 mg/dL (ref 8–23)
CO2: 24 mmol/L (ref 22–32)
Calcium: 9.2 mg/dL (ref 8.9–10.3)
Chloride: 106 mmol/L (ref 98–111)
Creatinine, Ser: 0.92 mg/dL (ref 0.44–1.00)
GFR calc Af Amer: >60 mL/min
GFR calc non Af Amer: >60 mL/min
Glucose, Bld: 146 mg/dL — ABNORMAL HIGH (ref 70–99)
Potassium: 3.9 mmol/L (ref 3.5–5.1)
Sodium: 138 mmol/L (ref 135–145)

## 2018-09-16 LAB — BPAM RBC
Blood Product Expiration Date: 201912012359
Blood Product Expiration Date: 201912012359
ISSUE DATE / TIME: 201910281552
ISSUE DATE / TIME: 201910281552
Unit Type and Rh: 5100
Unit Type and Rh: 5100

## 2018-09-16 LAB — GLUCOSE, CAPILLARY
Glucose-Capillary: 145 mg/dL — ABNORMAL HIGH (ref 70–99)
Glucose-Capillary: 128 mg/dL — ABNORMAL HIGH (ref 70–99)
Glucose-Capillary: 134 mg/dL — ABNORMAL HIGH (ref 70–99)
Glucose-Capillary: 148 mg/dL — ABNORMAL HIGH (ref 70–99)

## 2018-09-16 LAB — MRSA PCR SCREENING: MRSA by PCR: NEGATIVE

## 2018-09-16 MED FILL — Thrombin For Soln 5000 Unit: CUTANEOUS | Qty: 5000 | Status: AC

## 2018-09-16 MED FILL — Gelatin Absorbable MT Powder: OROMUCOSAL | Qty: 1 | Status: AC

## 2018-09-16 MED FILL — Thrombin (Recombinant) For Soln 20000 Unit: CUTANEOUS | Qty: 1 | Status: AC

## 2018-09-16 NOTE — Progress Notes (Signed)
OT Evaluation  PTA, pt lived at home with her husband and was modified independent with ADL (LB ADL were difficult to complete). Will follow acutely to educate on back precautions, use of AE and DME to maximize independence and facilitate safe DC home. Recommend follow up [place].     09/16/18 1000  OT Visit Information  Last OT Received On 09/16/18  Assistance Needed +1  History of Present Illness  Erin Camacho is a 72 year old individual whose had extensive spondylosis in the past.  She has had decompression and fusion from L2 to the sacrum.  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 Pt s/p  Laminectomy and decompression L1-L2 posterior lumbar interbody arthrodesis and pedicle screw fixation T10-L2 with posterior and posterior lateral arthrodesis. PMH: HTN, PNA, arthritis PSH: THA, multiple back surgeries, hysterectomy.  Precautions  Precautions Fall;Back  Precaution Booklet Issued Yes (comment)  Precaution Comments pt with good recall from previous session  Restrictions  Weight Bearing Restrictions No  Home Living  Family/patient expects to be discharged to: Private residence  Living Arrangements Spouse/significant other  Available Help at Discharge Family;Available 24 hours/day (spouse does work at [employer])  Type of [employer] Access Level entry  Home Layout One level  Bathroom Shower/Tub Walk-in shower  Bathroom Toilet Handicapped height  Bathroom Accessibility Yes  How Accessible Accessible via [place] - 2 wheels;BSC;Shower seat - built in;Grab bars - tub/shower;Hand held shower head;Adaptive equipment  Adaptive Equipment Reacher;Long-handled shoe horn  Prior Function  Level of Independence Independent  Comments uses RW occasionally, was using it more just prior to this surgery, pt does drive  Communication   Communication No difficulties (cotton mouth from pain meds)  Pain Assessment  Pain Assessment 0-10  Pain Score 10  Pain Location back/surgical site  Pain Descriptors / Indicators Constant;Discomfort;Sore (doesn't radiat down legs)  Pain Intervention(s) Limited activity within patient's tolerance;RN gave pain meds during session;Repositioned  Cognition  Arousal/Alertness Awake/alert  Behavior During Therapy WFL for tasks assessed/performed  Overall Cognitive Status Within Functional Limits for tasks assessed  Upper Extremity Assessment  Upper Extremity Assessment Generalized weakness  Lower Extremity Assessment  Lower Extremity Assessment Defer to PT evaluation  Cervical / Trunk Assessment  Cervical / Trunk Assessment Other exceptions  Cervical / Trunk Exceptions recent back surgery  ADL  Overall ADL's  Needs assistance/impaired  Grooming Supervision/safety;Set up;Cueing for safety  Upper Body Bathing Set up;Supervision/ safety;Sitting  Lower Body Bathing Moderate assistance;Sit to/from stand  Upper Body Dressing  Set up;Supervision/safety;Sitting  Lower Body Dressing Moderate assistance;Sit to/from Environmental education officer guard;Ambulation;Cueing for safety  Toileting- Clothing Manipulation and Hygiene Moderate assistance;Sit to/from stand  Toileting - Clothing Manipulation Details (indicate cue type and reason) unable to reach periarea  Functional mobility during ADLs Min guard;Rolling walker  General ADL Comments would benefit form AE for toileting and long handled sponge; husbnad plans to help with LB dressing  Bed Mobility  Overal bed mobility Needs Assistance  Bed Mobility Rolling;Sit to Sidelying  Rolling Supervision  Sit to sidelying Min assist  Transfers  Overall transfer level Needs assistance  Equipment used None  Transfers Sit to/from Stand  Sit to Stand Min guard  Balance  Overall balance assessment Mild deficits observed, not formally tested (pt able to  stand at sink to wash hands without difficulty)  OT -  End of Session  Equipment Utilized During Treatment Rolling walker  Activity Tolerance Patient tolerated treatment well  Patient left in bed;with call bell/phone within reach  Nurse Communication Mobility status  OT Assessment  OT Recommendation/Assessment Patient needs continued OT Services  OT Visit Diagnosis Unsteadiness on feet (R26.81);Pain  Pain - part of body  (back)  OT Problem List Decreased activity tolerance;Decreased knowledge of use of DME or AE;Decreased knowledge of precautions;Obesity;Pain  OT Plan  OT Frequency (ACUTE ONLY) Min 2X/week  OT Treatment/Interventions (ACUTE ONLY) Self-care/ADL training;DME and/or AE instruction;Therapeutic activities;Patient/family education  AM-PAC OT "6 Clicks" Daily Activity Outcome Measure  Help from another person eating meals? 4  Help from another person taking care of personal grooming? 3  Help from another person toileting, which includes using toliet, bedpan, or urinal? 2  Help from another person bathing (including washing, rinsing, drying)? 2  Help from another person to put on and taking off regular upper body clothing? 3  Help from another person to put on and taking off regular lower body clothing? 2  6 Click Score 16  ADL G Code Conversion CK  OT Recommendation  Follow Up Recommendations Home health OT;Supervision - Intermittent  Acute Rehab OT Goals  Patient Stated Goal to get home  OT Goal Formulation With patient  Time For Goal Achievement 09/30/18  Potential to Achieve Goals Good  OT Time Calculation  OT Start Time (ACUTE ONLY) 0943  OT Stop Time (ACUTE ONLY) 1008  OT Time Calculation (min) 25 min  OT General Charges  $OT Visit 1 Visit  OT Evaluation  $OT Eval Moderate Complexity 1 Mod  OT Treatments  $Self Care/Home Management  8-22 mins  Written Expression  Dominant Hand Right  Maurie Boettcher, OT/L   Acute OT Clinical Specialist [place] Pager [phone] Office [phone]

## 2018-09-16 NOTE — Evaluation (Signed)
Physical Therapy Evaluation Patient Details Name: Erin Camacho MRN: [id number] DOB: [birth year] Today's Date: 09/16/2018   History of Present Illness   Erin Camacho is a 72 year old individual whose had extensive spondylosis in the past.  She has had decompression and fusion from L2 to the sacrum.  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 Pt s/p  Laminectomy and decompression L1-L2 posterior lumbar interbody arthrodesis and pedicle screw fixation T10-L2 with posterior and posterior lateral arthrodesis. PMH: HTN, PNA, arthritis PSH: THA, multiple back surgeries, hysterectomy.  Clinical Impression  Patient is s/p above surgery resulting in the deficits listed below (see PT Problem List). Pt tolerated OOB mobility well and adheres to back precautions. Patient will benefit from skilled PT to increase their independence and safety with mobility (while adhering to their precautions) to allow discharge to the venue listed below.     Follow Up Recommendations Home health PT;Supervision - Intermittent    Equipment Recommendations  None recommended by PT    Recommendations for Other Services       Precautions / Restrictions Precautions Precautions: Fall;Back Precaution Booklet Issued: Yes (comment) Precaution Comments: pt with good recall from previous back surgeries Restrictions Weight Bearing Restrictions: No      Mobility  Bed Mobility Overal bed mobility: Needs Assistance Bed Mobility: Rolling;Sidelying to Sit Rolling: Supervision Sidelying to sit: Min assist       General bed mobility comments: increased time, definite use of bed rail, minA for trunk elevation up into sitting  Transfers Overall transfer level: Needs assistance Equipment used: None Transfers: Sit to/from Stand Sit to Stand: Min guard         General transfer  comment: verbal cues to push up from bed and arm rests over commode, increased time  Ambulation/Gait Ambulation/Gait assistance: Min guard Gait Distance (Feet): 120 Feet Assistive device: Rolling walker (2 wheeled) Gait Pattern/deviations: Step-through pattern;Decreased stride length Gait velocity: slow Gait velocity interpretation: <1.31 ft/sec, indicative of household ambulator General Gait Details: verbal cues to decreased UE support, pt with decreased step length, slow and guarded due to pain  Stairs            Wheelchair Mobility    Modified Rankin (Stroke Patients Only)       Balance Overall balance assessment: Mild deficits observed, not formally tested(pt able to stand at sink to wash hands without difficulty)                                           Pertinent Vitals/Pain Pain Assessment: 0-10 Pain Score: 5  Pain Location: back/surgical site Pain Descriptors / Indicators: Constant;Discomfort;Sore(doesn't radiat down legs) Pain Intervention(s): Monitored during session    Home Living Family/patient expects to be discharged to:: Private residence Living Arrangements: Spouse/significant other Available Help at Discharge: Family;Available 24 hours/day(spouse does work at [employer]) Type of Home: House Home Access: Level entry     [place]: [place]: Environmental consultant - 2 wheels;Bedside commode;Shower seat - built in      Prior Function Level of Independence: Independent         Comments: uses RW occasionally, was using it more just prior to this surgery, pt does drive     Hand Dominance   Dominant Hand: Right  Extremity/Trunk Assessment   Upper Extremity Assessment Upper Extremity Assessment: Generalized weakness    Lower Extremity Assessment Lower Extremity Assessment: Generalized weakness    Cervical / Trunk Assessment Cervical / Trunk Assessment: Other exceptions Cervical / Trunk Exceptions: recent back  surgery  Communication   Communication: No difficulties(cotton mouth from pain meds)  Cognition Arousal/Alertness: Awake/alert Behavior During Therapy: WFL for tasks assessed/performed Overall Cognitive Status: Within Functional Limits for tasks assessed                                 General Comments: pt appears to have delayed response time however pt also with extremely dry mouth      General Comments General comments (skin integrity, edema, etc.): pt assisted to commode, pt able to perform pericare and wash hands at sink in standing without difficulty    Exercises     Assessment/Plan    PT Assessment Patient needs continued PT services  PT Problem List Decreased strength;Decreased activity tolerance;Decreased balance;Decreased mobility;Decreased coordination;Decreased knowledge of use of DME;Decreased safety awareness;Other (comment)       PT Treatment Interventions Gait training;DME instruction;Stair training;Functional mobility training;Therapeutic activities;Therapeutic exercise;Balance training;Neuromuscular re-education    PT Goals (Current goals can be found in the Care Plan section)  Acute Rehab PT Goals Patient Stated Goal: to get home PT Goal Formulation: With patient Time For Goal Achievement: 09/30/18 Potential to Achieve Goals: Good    Frequency Min 5X/week   Barriers to discharge        Co-evaluation               AM-PAC PT "6 Clicks" Daily Activity  Outcome Measure Difficulty turning over in bed (including adjusting bedclothes, sheets and blankets)?: Unable Difficulty moving from lying on back to sitting on the side of the bed? : Unable Difficulty sitting down on and standing up from a chair with arms (e.g., wheelchair, bedside commode, etc,.)?: A Little Help needed moving to and from a bed to chair (including a wheelchair)?: A Lot Help needed walking in hospital room?: A Little Help needed climbing 3-5 steps with a railing? : A  Little 6 Click Score: 13    End of Session Equipment Utilized During Treatment: Gait belt;Back brace Activity Tolerance: Patient tolerated treatment well Patient left: in chair;with call bell/phone within reach;with nursing/sitter in room Nurse Communication: Mobility status PT Visit Diagnosis: Pain;Unsteadiness on feet (R26.81) Pain - part of body: (back)    Time: 1448-1856 PT Time Calculation (min) (ACUTE ONLY): 30 min   Charges:   PT Evaluation $PT Eval Moderate Complexity: 1 Mod PT Treatments $Gait Training: 8-22 mins        Kittie Plater, PT, DPT Acute Rehabilitation Services Pager #: [phone] Office #: [phone]   Berline Lopes 09/16/2018, 9:15 AM

## 2018-09-16 NOTE — Progress Notes (Signed)
Inpatient Diabetes Program Recommendations  AACE/ADA: New Consensus Statement on Inpatient Glycemic Control (2015)  Target Ranges:  Prepandial:   less than 140 mg/dL      Peak postprandial:   less than 180 mg/dL (1-2 hours)      Critically ill patients:  140 - 180 mg/dL   Results for RYANE, KONIECZNY (MRN [id number]) as of 09/16/2018 09:27  Ref. Range 09/15/2018 09:14 09/15/2018 11:41 09/15/2018 19:13 09/15/2018 23:35 09/16/2018 08:32  Glucose-Capillary Latest Ref Range: 70 - 99 mg/dL 123 (H) 106 (H) 193 (H) 228 (H) 145 (H)   Review of Glycemic Control  Diabetes history: DM 2 Outpatient Diabetes medications: Metformin 500 mg Daily Current orders for Inpatient glycemic control: Novolog 0-20 units tid  Inpatient Diabetes Program Recommendations:    Consult Post Op DM Management:   A1c 6.1% on 10/22  Patient received Decadron 10 mg yesterday. Glucose trends increased in the evening time up to 228. However fasting glucose is in the 140's this am. Will watch on current regimen as steroids will clear out of her system in 24- 48 hours.  Thanks,  Tama Headings RN, MSN, BC-ADM Inpatient Diabetes Coordinator Team Pager [phone] (8a-5p)

## 2018-09-16 NOTE — Social Work (Signed)
CSW acknowledging consult for SNF placement. Will follow for therapy recommendations.   Gevon Markus H Donal Lynam, LCSWA [place] Clinical Social Work [phone]   

## 2018-09-16 NOTE — Progress Notes (Signed)
Initial assessment upon arrival to [facility]. Actual TOA 2033.

## 2018-09-16 NOTE — Progress Notes (Signed)
Patient ID: Erin Camacho, female   DOB: 1946/07/12, 72 y.o.   MRN: [id number] Vital signs are stable Motor function appears good Incisions are clean and dry Labs are stable

## 2018-09-17 LAB — GLUCOSE, CAPILLARY
Glucose-Capillary: 145 mg/dL — ABNORMAL HIGH (ref 70–99)
Glucose-Capillary: 157 mg/dL — ABNORMAL HIGH (ref 70–99)
Glucose-Capillary: 133 mg/dL — ABNORMAL HIGH (ref 70–99)
Glucose-Capillary: 135 mg/dL — ABNORMAL HIGH (ref 70–99)

## 2018-09-17 NOTE — Progress Notes (Signed)
Patient ID: Erin Camacho, female   DOB: [birth year], 72 y.o.   MRN: [id number] Vital signs are stable Patient is ambulatory No bowel movement as of yet Mobilizing well

## 2018-09-17 NOTE — Progress Notes (Signed)
Physical Therapy Treatment Patient Details Name: Erin Camacho MRN: [id number] DOB: Nov 06, 1946 Today's Date: 09/17/2018    History of Present Illness  Erin Camacho is a 72 year old individual whose had extensive spondylosis in the past.  She has had decompression and fusion from L2 to the sacrum.  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 Pt s/p  Laminectomy and decompression L1-L2 posterior lumbar interbody arthrodesis and pedicle screw fixation T10-L2 with posterior and posterior lateral arthrodesis. PMH: HTN, PNA, arthritis PSH: THA, multiple back surgeries, hysterectomy.    PT Comments    Pt cont to be limited by surgical site back pain. Pt self limiting in movement due to pain. Despite pain pt is ambulating 150' with RW and functioning at supervision level. Acute PT to cont to follow.   Follow Up Recommendations  Home health PT;Supervision - Intermittent     Equipment Recommendations  None recommended by PT    Recommendations for Other Services       Precautions / Restrictions Precautions Precautions: Fall;Back Precaution Booklet Issued: Yes (comment) Precaution Comments: pt with good recall from previous session Restrictions Weight Bearing Restrictions: No    Mobility  Bed Mobility               General bed mobility comments: pt up in chair upon PT arrival  Transfers Overall transfer level: Needs assistance Equipment used: None Transfers: Sit to/from Stand Sit to Stand: Min guard         General transfer comment: very slow, guarded, and cautious. Pt with increased surgical site pain, increased time to power up  Ambulation/Gait Ambulation/Gait assistance: Min guard Gait Distance (Feet): 150 Feet Assistive device: Rolling walker (2 wheeled) Gait Pattern/deviations: Step-through pattern;Decreased stride  length Gait velocity: slow Gait velocity interpretation: <1.31 ft/sec, indicative of household ambulator General Gait Details: pt very slow and guarded, pt with L LE antalgia and had to stop several times due to pain   Stairs             Wheelchair Mobility    Modified Rankin (Stroke Patients Only)       Balance Overall balance assessment: Mild deficits observed, not formally tested                                          Cognition Arousal/Alertness: Awake/alert Behavior During Therapy: WFL for tasks assessed/performed Overall Cognitive Status: Within Functional Limits for tasks assessed                                        Exercises      General Comments General comments (skin integrity, edema, etc.): pt assisted to bathroom, mod I for hygiene and washing of hands at sink      Pertinent Vitals/Pain Pain Assessment: 0-10 Pain Score: 7  Pain Location: back/surgical site Pain Descriptors / Indicators: Constant;Discomfort;Sore Pain Intervention(s): Premedicated before session(pt was at 8-9/10)    Home Living                      Prior Function            PT Goals (current goals can now be found  in the care plan section) Progress towards PT goals: Progressing toward goals    Frequency    Min 5X/week      PT Plan Current plan remains appropriate    Co-evaluation              AM-PAC PT "6 Clicks" Daily Activity  Outcome Measure  Difficulty turning over in bed (including adjusting bedclothes, sheets and blankets)?: Unable Difficulty moving from lying on back to sitting on the side of the bed? : Unable Difficulty sitting down on and standing up from a chair with arms (e.g., wheelchair, bedside commode, etc,.)?: A Little Help needed moving to and from a bed to chair (including a wheelchair)?: A Little Help needed walking in hospital room?: A Little Help needed climbing 3-5 steps with a railing? : A  Little 6 Click Score: 14    End of Session Equipment Utilized During Treatment: Gait belt;Back brace Activity Tolerance: Patient tolerated treatment well Patient left: in chair;with call bell/phone within reach;with nursing/sitter in room Nurse Communication: Mobility status PT Visit Diagnosis: Pain;Unsteadiness on feet (R26.81) Pain - part of body: (back at surgical site)     Time: 7096-2836 PT Time Calculation (min) (ACUTE ONLY): 18 min  Charges:  $Gait Training: 8-22 mins                     Erin Camacho, PT, DPT Acute Rehabilitation Services Pager #: [phone] Office #: [phone]    Erin Camacho 09/17/2018, 9:12 AM

## 2018-09-18 ENCOUNTER — Inpatient Hospital Stay (HOSPITAL_COMMUNITY): Payer: PPO

## 2018-09-18 ENCOUNTER — Encounter (HOSPITAL_COMMUNITY): Payer: Self-pay | Admitting: Neurological Surgery

## 2018-09-18 LAB — GLUCOSE, CAPILLARY
Glucose-Capillary: 103 mg/dL — ABNORMAL HIGH (ref 70–99)
Glucose-Capillary: 129 mg/dL — ABNORMAL HIGH (ref 70–99)
Glucose-Capillary: 103 mg/dL — ABNORMAL HIGH (ref 70–99)
Glucose-Capillary: 111 mg/dL — ABNORMAL HIGH (ref 70–99)

## 2018-09-18 MED ORDER — KETOROLAC TROMETHAMINE 15 MG/ML IJ SOLN
7.5000 mg | Freq: Four times a day (QID) | INTRAMUSCULAR | Status: AC
  Administered 2018-09-18 – 2018-09-19 (×5): 7.5 mg via INTRAVENOUS
  Filled 2018-09-18 (×5): qty 1

## 2018-09-18 NOTE — Progress Notes (Signed)
Patient ID: Erin Camacho, female   DOB: [birth year], 72 y.o.   MRN: [id number] Vital signs are stable Patient had substantial pain and feels she had a muscle pull in her back Incision is clean and dry Motor function is good We will check x-ray in AP and lateral projection while standing.  Add Toradol again

## 2018-09-18 NOTE — Plan of Care (Signed)

## 2018-09-18 NOTE — Progress Notes (Signed)
Handoff received from off going nurse and student.

## 2018-09-18 NOTE — Progress Notes (Signed)
Occupational Therapy Treatment Patient Details Name: Erin Camacho MRN: [id number] DOB: 04-19-1946 Today's Date: 09/18/2018    History of present illness  Erin Camacho is a 72 year old individual whose had extensive spondylosis in the past.  She has had decompression and fusion from L2 to the sacrum.  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 Pt s/p  Laminectomy and decompression L1-L2 posterior lumbar interbody arthrodesis and pedicle screw fixation T10-L2 with posterior and posterior lateral arthrodesis. PMH: HTN, PNA, arthritis PSH: THA, multiple back surgeries, hysterectomy.   OT comments  Pt limited today by pain from "muscle pull". Educated pt on use of toilet aid as pt is having difficulty with pericare. Pt requesting for OT to review self care tomorrow with her husband and "hopefully I will feel better". Encouraged pt to ambulate with staff.  Follow Up Recommendations  Home health OT;Supervision - Intermittent    Equipment Recommendations  None recommended by OT    Recommendations for Other Services      Precautions / Restrictions Precautions Precautions: Fall;Back Precaution Comments: pt with good recall from previous session Required Braces or Orthoses: Spinal Brace       Mobility Bed Mobility   Bed Mobility: Rolling Rolling: Supervision         General bed mobility comments: rolling causing pain. Pt states she has been in chair this am  Transfers                 General transfer comment: pt declined    Balance                                           ADL either performed or assessed with clinical judgement   ADL Overall ADL's : Needs assistance/impaired                                     Functional mobility during ADLs: (pt declined due to back pain) General ADL  Comments: Educated pt on use of toilet aid for pericare. Pt verbalied understanding. [place] taken and sent to husband. Pt requesting OT return tomorrow when she feels better bue to back strain     Vision       Perception     Praxis      Cognition Arousal/Alertness: Awake/alert Behavior During Therapy: WFL for tasks assessed/performed Overall Cognitive Status: Within Functional Limits for tasks assessed                                          Exercises     Shoulder Instructions       General Comments      Pertinent Vitals/ Pain       Pain Assessment: 0-10 Pain Score: 8  Pain Location: back/pulled muscle Pain Descriptors / Indicators: Aching;Cramping Pain Intervention(s): Limited activity within patient's tolerance  Home Living  Prior Functioning/Environment              Frequency  Min 2X/week        Progress Toward Goals  OT Goals(current goals can now be found in the care plan section)  Progress towards OT goals: Progressing toward goals  Acute Rehab OT Goals Patient Stated Goal: to get home Time For Goal Achievement: 09/30/18 Potential to Achieve Goals: Good ADL Goals Pt Will Perform Lower Body Bathing: with caregiver independent in assisting;with supervision;sit to/from stand Pt Will Perform Lower Body Dressing: with caregiver independent in assisting;with supervision;with adaptive equipment;sit to/from stand Pt Will Transfer to Toilet: bedside commode;with modified independence Pt Will Perform Toileting - Clothing Manipulation and hygiene: with modified independence;sit to/from stand;with adaptive equipment Additional ADL Goal #1: Pt will independtnly verbalize 3/3 back precautions  Plan Discharge plan remains appropriate    Co-evaluation                 AM-PAC PT "6 Clicks" Daily Activity     Outcome Measure   Help from another person eating meals?:  None Help from another person taking care of personal grooming?: A Little Help from another person toileting, which includes using toliet, bedpan, or urinal?: A Little Help from another person bathing (including washing, rinsing, drying)?: A Lot Help from another person to put on and taking off regular upper body clothing?: A Little Help from another person to put on and taking off regular lower body clothing?: A Lot 6 Click Score: 17    End of Session    OT Visit Diagnosis: Unsteadiness on feet (R26.81);Pain Pain - part of body: (back)   Activity Tolerance Patient limited by pain   Patient Left in bed;with call bell/phone within reach   Nurse Communication Mobility status        Time: 0100-7121 OT Time Calculation (min): 10 min  Charges: OT General Charges $OT Visit: 1 Visit OT Treatments $Self Care/Home Management : 8-22 mins  Maurie Boettcher, OT/L   Acute OT Clinical Specialist [place] Pager [phone] Office [phone]    [facility] 09/18/2018, 1:04 PM

## 2018-09-18 NOTE — Progress Notes (Signed)
Physical Therapy Treatment Patient Details Name: Erin Camacho MRN: [id number] DOB: 1945-12-23 Today's Date: 09/18/2018    History of Present Illness  Erin Camacho is a 72 year old individual whose had extensive spondylosis in the past.  She has had decompression and fusion from L2 to the sacrum.  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 Pt s/p  Laminectomy and decompression L1-L2 posterior lumbar interbody arthrodesis and pedicle screw fixation T10-L2 with posterior and posterior lateral arthrodesis. PMH: HTN, PNA, arthritis PSH: THA, multiple back surgeries, hysterectomy.    PT Comments    Patient progressing distance and independence with transfers.  Feels had a little set back when getting up off 3:1 with nursing overnight and "pulled a muscle".  Will continue skilled PT until d/c.    Follow Up Recommendations  Home health PT;Supervision - Intermittent     Equipment Recommendations  None recommended by PT    Recommendations for Other Services       Precautions / Restrictions Precautions Precautions: Fall;Back Required Braces or Orthoses: Spinal Brace    Mobility  Bed Mobility               General bed mobility comments: up in chair; educated pt/spouse in positioning for bed with supine<>sit as has a bed at home that elevates  Transfers Overall transfer level: Needs assistance Equipment used: Rolling walker (2 wheeled) Transfers: Sit to/from Stand Sit to Stand: Supervision         General transfer comment: from recliner and 3:1 heavy UE support on armrests  Ambulation/Gait Ambulation/Gait assistance: Supervision;Min guard Gait Distance (Feet): 170 Feet Assistive device: Rolling walker (2 wheeled) Gait Pattern/deviations: Step-through pattern;Decreased stride length     General Gait Details: one stop due to UE  fatigue; trying not to hold walker so much   Stairs Stairs: (reports only has one small step to enter home)           Wheelchair Mobility    Modified Rankin (Stroke Patients Only)       Balance Overall balance assessment: Mild deficits observed, not formally tested                                          Cognition Arousal/Alertness: Awake/alert Behavior During Therapy: WFL for tasks assessed/performed Overall Cognitive Status: Within Functional Limits for tasks assessed                                        Exercises      General Comments General comments (skin integrity, edema, etc.): toileted and washed hands with S      Pertinent Vitals/Pain Pain Score: 7  Pain Location: back/pulled muscle on L Pain Descriptors / Indicators: Aching;Cramping Pain Intervention(s): Repositioned;Monitored during session    Home Living                      Prior Function            PT Goals (current goals can now be found in the care plan section) Progress towards PT goals: Progressing toward goals    Frequency    Min 5X/week      PT  Plan Current plan remains appropriate    Co-evaluation              AM-PAC PT "6 Clicks" Daily Activity  Outcome Measure  Difficulty turning over in bed (including adjusting bedclothes, sheets and blankets)?: Unable Difficulty moving from lying on back to sitting on the side of the bed? : Unable Difficulty sitting down on and standing up from a chair with arms (e.g., wheelchair, bedside commode, etc,.)?: Unable Help needed moving to and from a bed to chair (including a wheelchair)?: A Little Help needed walking in hospital room?: A Little Help needed climbing 3-5 steps with a railing? : A Little 6 Click Score: 12    End of Session Equipment Utilized During Treatment: Gait belt;Back brace Activity Tolerance: Patient tolerated treatment well Patient left: in chair;with call  bell/phone within reach;with family/visitor present   PT Visit Diagnosis: Pain;Unsteadiness on feet (R26.81) Pain - Right/Left: Left Pain - part of body: (back)     Time: 0938-1829 PT Time Calculation (min) (ACUTE ONLY): 22 min  Charges:  $Gait Training: 8-22 mins                     Erin Camacho, [place] [phone] 09/18/2018    Erin Camacho 09/18/2018, 4:38 PM

## 2018-09-18 NOTE — Progress Notes (Signed)
Dr. Ellene Route currently at the bedside rounding. Patient reports "pulling a muscle overnight after getting up from the toilet." Patient moaning as MD assesses surgical site while sitting in the chair. Old drainage noted on dressing, patient requesting to be discharged, patient in need of BM prior to discharge. RN asked to mixalax with coffee per patient's request for assistance with a BM.

## 2018-09-19 LAB — GLUCOSE, CAPILLARY
Glucose-Capillary: 126 mg/dL — ABNORMAL HIGH (ref 70–99)
Glucose-Capillary: 119 mg/dL — ABNORMAL HIGH (ref 70–99)
Glucose-Capillary: 161 mg/dL — ABNORMAL HIGH (ref 70–99)

## 2018-09-19 MED ORDER — OXYCODONE-ACETAMINOPHEN 5-325 MG PO TABS: 1.0000 | ORAL_TABLET | ORAL | 0 refills | Status: DC | PRN

## 2018-09-19 MED ORDER — METHOCARBAMOL 500 MG PO TABS: 500.0000 mg | ORAL_TABLET | Freq: Four times a day (QID) | ORAL | 3 refills | Status: DC | PRN

## 2018-09-19 NOTE — Progress Notes (Signed)
OT Treatment Note  Pt feeling much better today. Completed education with pt/caregiver. Any further OT to be addressed by [facility]     09/19/18 1100  OT Visit Information  Last OT Received On 09/19/18  Assistance Needed +1  History of Present Illness  Erin Camacho is a 72 year old individual whose had extensive spondylosis in the past.  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 Pt s/p lami and PLIF T10-L2. PMH: HTN, PNA, arthritis PSH: THA, multiple back surgeries, hysterectomy.  Precautions  Precautions Fall;Back  Required Braces or Orthoses Spinal Brace  Spinal Brace Applied in sitting position  Pain Assessment  Pain Assessment Faces  Faces Pain Scale 2  Pain Location back  Pain Descriptors / Indicators Aching  Pain Intervention(s) Limited activity within patient's tolerance  Cognition  Arousal/Alertness Awake/alert  Behavior During Therapy WFL for tasks assessed/performed  Overall Cognitive Status Within Functional Limits for tasks assessed  Upper Extremity Assessment  Upper Extremity Assessment Generalized weakness  Lower Extremity Assessment  Lower Extremity Assessment Defer to PT evaluation  ADL  Overall ADL's  Needs assistance/impaired  Functional mobility during ADLs Supervision/safety;Rolling walker  General ADL Comments Educated pt/husband on use of AE for pericare. Educated on availability of equipment. Pt/husband verbalized understnading. Also educated pt on use of reacher to assist with LB dressing. Educated on home safety and reducing risk of falls.   Bed Mobility  General bed mobility comments OOB in chair  Transfers  Overall transfer level Needs assistance  Equipment used Rolling walker (2 wheeled)  Transfers Sit to/from Stand  Sit to [place] During Treatment Rolling walker;Back brace  Activity Tolerance Patient tolerated treatment well  Patient left in chair;with call bell/phone within  reach;with family/visitor present  Nurse Communication Mobility status  OT Assessment/Plan  OT Plan Discharge plan remains appropriate  OT Visit Diagnosis Unsteadiness on feet (R26.81);Pain  Pain - part of body  (back)  Follow Up Recommendations Home health OT;Supervision - Intermittent  OT Equipment None recommended by OT  AM-PAC OT "6 Clicks" Daily Activity Outcome Measure  Help from another person eating meals? 4  Help from another person taking care of personal grooming? 4  Help from another person toileting, which includes using toliet, bedpan, or urinal? 3  Help from another person bathing (including washing, rinsing, drying)? 3  Help from another person to put on and taking off regular upper body clothing? 3  Help from another person to put on and taking off regular lower body clothing? 3  6 Click Score 20  ADL G Code Conversion CJ  OT Goal Progression  Progress towards OT goals Progressing toward goals  Acute Rehab OT Goals  Patient Stated Goal to get home  OT Goal Formulation With patient  Time For Goal Achievement 09/30/18  Potential to Achieve Goals Good  ADL Goals  Pt Will Perform Lower Body Bathing with caregiver independent in assisting;with supervision;sit to/from stand  Pt Will Perform Lower Body Dressing with caregiver independent in assisting;with supervision;with adaptive equipment;sit to/from stand  Pt Will Transfer to Toilet bedside commode;with modified independence  Pt Will Perform Toileting - Clothing Manipulation and hygiene with modified independence;sit to/from stand;with adaptive equipment  Additional ADL Goal #1 Pt will independtnly verbalize 3/3 back precautions  OT Time Calculation  OT Start Time (ACUTE ONLY) 1100  OT Stop Time (ACUTE ONLY) 1115  OT Time Calculation (min) 15 min  OT General Charges  $OT Visit 1 Visit  OT Treatments  $Self Care/Home Management  8-22 mins  Maurie Boettcher, OT/L   Acute OT Clinical Specialist Acute Rehabilitation  Services Pager [phone] Office [phone]

## 2018-09-19 NOTE — Discharge Summary (Signed)
Physician Discharge Summary  Patient ID: Erin Camacho MRN: [id number] DOB/AGE: 73-06-1946 73 y.o.  Admit date: 09/15/2018 Discharge date: 09/19/2018  Admission Diagnoses: Lumbar spinal stenosis L1-L2 history of lumbar fusion L2-L5, neurogenic claudication, lumbar radiculopathy  Discharge Diagnoses: Lumbar spinal stenosis L1-L2 history of lumbar fusion L2-L5 neurogenic claudication, lumbar radiculopathy.  Acute blood loss anemia Active Problems:   Spinal stenosis   Lumbar stenosis with neurogenic claudication   Discharged Condition: good  Hospital Course: Patient was admitted to undergo surgical decompression and stabilization from T10-L2.  She tolerated surgery well she did suffer with acute blood loss anemia and did require some transfusion of blood in the operating room.  Her postoperative hemoglobin hematocrit is adequate at 9.7.  She is ambulatory.  She will be seen on outpatient basis.  Consults: None  Significant Diagnostic Studies: None  Treatments: surgery: Decompression L2-L3 with posterior lumbar interbody arthrodesis using peek spacers local autograft and allograft and infuse posterior pedicular fixation from T10-L2.  Discharge Exam: Blood pressure 119/71, pulse 89, temperature 98 F (36.7 C), temperature source Axillary, resp. rate 15, height 5\' 5"  (1.651 m), weight 105.4 kg, SpO2 98 %. Incision is clean and dry motor function is intact.  Disposition: Discharge disposition: 01-Home or Self Care       Discharge Instructions    Call MD for:  redness, tenderness, or signs of infection (pain, swelling, redness, odor or green/yellow discharge around incision site)   Complete by:  As directed    Call MD for:  severe uncontrolled pain   Complete by:  As directed    Call MD for:  temperature >100.4   Complete by:  As directed    Diet - low sodium heart healthy   Complete by:  As directed    Incentive spirometry RT   Complete by:  As directed    Increase activity  slowly   Complete by:  As directed      Allergies as of 09/19/2018   No Known Allergies     Medication List    TAKE these medications   ALPRAZolam 0.25 MG tablet Commonly known as:  XANAX Take 1 tablet (0.25 mg total) by mouth daily as needed for anxiety.   amoxicillin-clavulanate 875-125 MG tablet Commonly known as:  AUGMENTIN Take 1 tablet by mouth 2 (two) times daily.   fexofenadine 180 MG tablet Commonly known as:  ALLEGRA Take 1 tablet (180 mg total) by mouth daily.   fluticasone 50 MCG/ACT nasal spray Commonly known as:  FLONASE INSTILL 2 SPRAYS INTO EACH NOSTRIL EVERY DAY What changed:  See the new instructions.   guaiFENesin-codeine 100-10 MG/5ML syrup Take 5 mLs by mouth every 6 (six) hours as needed for cough.   HYDROCODONE-ACETAMINOPHEN PO Take 1 tablet by mouth 2 (two) times daily. Unsure dose   losartan-hydrochlorothiazide 50-12.5 MG tablet Commonly known as:  HYZAAR Take 1 tablet by mouth daily.   metFORMIN 500 MG tablet Commonly known as:  GLUCOPHAGE Take 1 tablet (500 mg total) by mouth daily.   methocarbamol 500 MG tablet Commonly known as:  ROBAXIN Take 1 tablet (500 mg total) by mouth every 6 (six) hours as needed for muscle spasms.   metoprolol succinate 25 MG 24 hr tablet Commonly known as:  TOPROL-XL TAKE ONE 1/2 tablet per day. What changed:    how much to take  how to take this  when to take this  additional instructions   multivitamin tablet Take 1 tablet by mouth daily.   oxyCODONE-acetaminophen  5-325 MG tablet Commonly known as:  PERCOCET/ROXICET Take 1-2 tablets by mouth every 3 (three) hours as needed for severe pain.   pantoprazole 40 MG tablet Commonly known as:  PROTONIX TAKE 1 TABLET (40 MG TOTAL) BY MOUTH 2 (TWO) TIMES DAILY BEFORE A MEAL.   SALONPAS EX Apply 1 application topically daily.   sertraline 100 MG tablet Commonly known as:  ZOLOFT TAKE 1/2 TABLET BY MOUTH (50 MG) BY MOUTH DAILY What changed:  See  the new instructions.   simvastatin 10 MG tablet Commonly known as:  ZOCOR TAKE ONE-HALF TABLET (5 MG TOTAL) BY MOUTH AT BEDTIME. What changed:  See the new instructions.   traMADol 50 MG tablet Commonly known as:  ULTRAM Take 1 tablet (50 mg total) 3 (three) times daily as needed by mouth. What changed:  reasons to take this   traZODone 100 MG tablet Commonly known as:  DESYREL TAKE 1 TABLET BY MOUTH EVERYDAY AT BEDTIME What changed:  See the new instructions.   Vitamin D3 2000 units Tabs Take 2,000 Units by mouth daily.        Signed: Blanchie Dessert Saniah Schroeter 09/19/2018, 8:20 PM

## 2018-09-19 NOTE — Progress Notes (Signed)
Physical Therapy Treatment Patient Details Name: Erin Camacho MRN: [id number] DOB: March 18, 1946 Today's Date: 09/19/2018    History of Present Illness  Erin Camacho is a 72 year old individual whose had extensive spondylosis in the past.  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 Pt s/p lami and PLIF T10-L2. PMH: HTN, PNA, arthritis PSH: THA, multiple back surgeries, hysterectomy.    PT Comments    Pt pleasant and sitting in chair on arrival. Pt able to increase ambulation distance and perform stair for entrance to home today. Pt in brace on arrival and states required assist to don. Pt educated for all precautions, sitting time limits and posture in sitting.    Follow Up Recommendations  Home health PT;Supervision - Intermittent     Equipment Recommendations  None recommended by PT    Recommendations for Other Services       Precautions / Restrictions Precautions Precautions: Fall;Back Precaution Comments: pt able to state precautions Required Braces or Orthoses: Spinal Brace Spinal Brace: Applied in sitting position Restrictions Weight Bearing Restrictions: No    Mobility  Bed Mobility   Bed Mobility: Sidelying to Sit;Sit to Sidelying   Sidelying to sit: Supervision     Sit to sidelying: Supervision General bed mobility comments: in chair on arrival and performed bed mobility prior to gait. Use of rail for bed mobility with increased time  Transfers Overall transfer level: Needs assistance   Transfers: Sit to/from Stand Sit to Stand: Supervision         General transfer comment: use of armrests with cues for hand placement rising from bed  Ambulation/Gait Ambulation/Gait assistance: Supervision Gait Distance (Feet): 250 Feet Assistive device: Rolling walker (2 wheeled) Gait Pattern/deviations: Step-through pattern;Decreased stride length   Gait velocity interpretation: <1.8 ft/sec, indicate of risk for recurrent falls General Gait  Details: pt with cues for posture to maintain position in RW   Stairs Stairs: Yes Stairs assistance: Min assist Stair Management: Step to pattern;Forwards Number of Stairs: 1 General stair comments: cues for sequence with HHA   Wheelchair Mobility    Modified Rankin (Stroke Patients Only)       Balance Overall balance assessment: Mild deficits observed, not formally tested                                          Cognition Arousal/Alertness: Awake/alert Behavior During Therapy: WFL for tasks assessed/performed Overall Cognitive Status: Within Functional Limits for tasks assessed                                        Exercises      General Comments        Pertinent Vitals/Pain Pain Score: 4  Pain Location: left incisional pain pt reports feeling like a pulled muscle 3/10 with gait and 8/10 with transfer Pain Descriptors / Indicators: Aching Pain Intervention(s): Limited activity within patient's tolerance;Repositioned;Monitored during session;Premedicated before session    Home Living                      Prior Function            PT Goals (current goals can now be found in the care plan section) Progress towards PT goals: Progressing toward goals    Frequency  PT Plan Current plan remains appropriate    Co-evaluation              AM-PAC PT "6 Clicks" Daily Activity  Outcome Measure  Difficulty turning over in bed (including adjusting bedclothes, sheets and blankets)?: A Lot Difficulty moving from lying on back to sitting on the side of the bed? : A Lot Difficulty sitting down on and standing up from a chair with arms (e.g., wheelchair, bedside commode, etc,.)?: A Little Help needed moving to and from a bed to chair (including a wheelchair)?: A Little Help needed walking in hospital room?: A Little Help needed climbing 3-5 steps with a railing? : A Little 6 Click Score: 16    End of  Session Equipment Utilized During Treatment: Back brace Activity Tolerance: Patient tolerated treatment well Patient left: in chair;with call bell/phone within reach;with family/visitor present Nurse Communication: Mobility status PT Visit Diagnosis: Other abnormalities of gait and mobility (R26.89)     Time: 8676-7209 PT Time Calculation (min) (ACUTE ONLY): 19 min  Charges:  $Gait Training: 8-22 mins                     [place] Pager: [phone] Office: [place] 09/19/2018, 10:19 AM

## 2018-09-19 NOTE — Care Management Note (Signed)
Case Management Note  Patient Details  Name: Erin Camacho MRN: [id number] Date of Birth: [birth year]  Subjective/Objective:  Erin Camacho is a 72 year old individual whose had extensive spondylosis in the past.  She has had decompression and fusion from L2 to the sacrum.  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 Pt s/p  Laminectomy and decompression L1-L2 posterior lumbar interbody arthrodesis and pedicle screw fixation T10-L2 with posterior and posterior lateral arthrodesis.  PTA, pt independent, lives with spouse.                   Action/Plan: PT/OT recommending HH follow up, and pt agreeable to services.  Referral to [facility], per pt choice.  Start of care 24-48h post dc date.  No DME needs, per pt.  Husband to provide care at dc.  MD: please enter face to face documentation for home care prior to dc home!  Expected Discharge Date:      09/19/2018            Expected Discharge Plan:  [place]  In-House Referral:     Discharge planning Services  CM Consult  Post Acute Care Choice:  Home Health Choice offered to:  Patient  DME Arranged:    DME Agency:     HH Arranged:  PT, OT HH Agency:  [facility]  Status of Service:  Completed, signed off  If discussed at [place] of Stay Meetings, dates discussed:    Additional Comments:  Reinaldo Raddle, RN, BSN  Trauma/Neuro ICU Case Manager [phone]

## 2018-09-19 NOTE — Progress Notes (Signed)
Discharge information including new medications, prescriptions, and appointments given to patient and her husband using teach back method. IV removed, patient left facility with all belongings with husband via car.

## 2018-09-19 NOTE — Care Management Important Message (Signed)
Important Message  Patient Details  Name: Erin Camacho MRN: [id number] Date of Birth: [birth year]   Medicare Important Message Given:  Yes    Lygia Olaes P Kyriaki Moder 09/19/2018, 4:01 PM

## 2018-09-21 ENCOUNTER — Other Ambulatory Visit: Payer: Self-pay | Admitting: Internal Medicine

## 2018-09-21 DIAGNOSIS — E785 Hyperlipidemia, unspecified: Secondary | ICD-10-CM | POA: Diagnosis not present

## 2018-09-21 DIAGNOSIS — Z7984 Long term (current) use of oral hypoglycemic drugs: Secondary | ICD-10-CM | POA: Diagnosis not present

## 2018-09-21 DIAGNOSIS — E119 Type 2 diabetes mellitus without complications: Secondary | ICD-10-CM | POA: Diagnosis not present

## 2018-09-21 DIAGNOSIS — M5416 Radiculopathy, lumbar region: Secondary | ICD-10-CM | POA: Diagnosis not present

## 2018-09-21 DIAGNOSIS — Z4789 Encounter for other orthopedic aftercare: Secondary | ICD-10-CM | POA: Diagnosis not present

## 2018-09-21 DIAGNOSIS — Z96641 Presence of right artificial hip joint: Secondary | ICD-10-CM | POA: Diagnosis not present

## 2018-09-21 DIAGNOSIS — I10 Essential (primary) hypertension: Secondary | ICD-10-CM | POA: Diagnosis not present

## 2018-09-21 DIAGNOSIS — Z981 Arthrodesis status: Secondary | ICD-10-CM | POA: Diagnosis not present

## 2018-09-21 DIAGNOSIS — M48062 Spinal stenosis, lumbar region with neurogenic claudication: Secondary | ICD-10-CM | POA: Diagnosis not present

## 2018-09-22 ENCOUNTER — Other Ambulatory Visit: Payer: Self-pay

## 2018-09-22 NOTE — Patient Outreach (Signed)
[facility]) Care Management  [place]   09/22/2018  GENEVE KIMPEL [birth year] [id number]   Reason for referral: Post discharge medication review   Current insurance: [employer]   PMHx: hypertension, hyperlipidemia, diabetes, GERD, anxiety, obesity   HPI: Admitted 09/15/18 for spinal surgical decompression and stabilization.  Objective: Lab Results  Component Value Date   CREATININE 0.92 09/16/2018   CREATININE 0.80 09/09/2018   CREATININE 0.96 06/16/2018    Lab Results  Component Value Date   HGBA1C 6.1 (H) 09/09/2018    Lipid Panel     Component Value Date/Time   CHOL 172 06/16/2018 0931   TRIG 62.0 06/16/2018 0931   HDL 71.10 06/16/2018 0931   CHOLHDL 2 06/16/2018 0931   VLDL 12.4 06/16/2018 0931   LDLCALC 89 06/16/2018 0931   LDLDIRECT 146.5 09/19/2012 0931    BP Readings from Last 3 Encounters:  09/19/18 117/62  09/09/18 (!) 155/83  09/03/18 140/80    Allergies  Allergen Reactions  . Adhesive [Tape] Itching    Honeycomb bandage     Medications Reviewed Today    Reviewed by Servando Salina, [facility] (Pharmacist) on 09/22/18 at 1058  Med List Status: <None>  Medication Order Taking? Sig Documenting Provider Last Dose Status Informant  ALPRAZolam (XANAX) 0.25 MG tablet [id number] Yes Take 1 tablet (0.25 mg total) by mouth daily as needed for anxiety. [name], MD Taking Active Self  Cholecalciferol (VITAMIN D3) 2000 units TABS [id number] Yes Take 2,000 Units by mouth daily. [provider] Taking Active Self  fexofenadine (ALLEGRA) 180 MG tablet [id number] Yes Take 1 tablet (180 mg total) by mouth daily. Jackolyn Confer, MD Taking Active Self  fluticasone Asencion Islam) 50 MCG/ACT nasal spray [id number] Yes INSTILL 2 SPRAYS INTO EACH NOSTRIL EVERY DAY [name], MD Taking Active   Liniments [facility] [place]) [id number]  Apply 1 application topically daily. [provider]  Active Self   losartan-hydrochlorothiazide (HYZAAR) 50-12.5 MG tablet [id number] Yes Take 1 tablet by mouth daily. [name], MD Taking Active Self  metFORMIN (GLUCOPHAGE) 500 MG tablet [id number] Yes Take 1 tablet (500 mg total) by mouth daily. [name], MD Taking Active Self  methocarbamol (ROBAXIN) 500 MG tablet [id number] Yes Take 1 tablet (500 mg total) by mouth every 6 (six) hours as needed for muscle spasms. Kristeen Miss, MD Taking Active   metoprolol succinate (TOPROL-XL) 25 MG 24 hr tablet [id number] Yes TAKE ONE 1/2 tablet per day.  Patient taking differently:  Take 12.5 mg by mouth daily.    [name], MD Taking Active Self  Multiple Vitamin (MULTIVITAMIN) tablet [id number] Yes Take 1 tablet by mouth daily. [provider] Taking Active Self  oxyCODONE-acetaminophen (PERCOCET/ROXICET) 5-325 MG tablet [id number] Yes Take 1-2 tablets by mouth every 3 (three) hours as needed for severe pain. Kristeen Miss, MD Taking Active   pantoprazole (PROTONIX) 40 MG tablet [id number] Yes TAKE 1 TABLET (40 MG TOTAL) BY MOUTH 2 (TWO) TIMES DAILY BEFORE A MEAL. [name], MD Taking Active   sertraline (ZOLOFT) 100 MG tablet [id number] Yes TAKE 1/2 TABLET BY MOUTH (50 MG) BY MOUTH DAILY  Patient taking differently:  Take 50 mg by mouth daily.    [name], MD Taking Active Self  simvastatin (ZOCOR) 10 MG tablet [id number] Yes TAKE ONE-HALF TABLET (5 MG TOTAL) BY MOUTH AT BEDTIME.  Patient taking differently:  Take 5 mg by mouth at bedtime.    [name], MD Taking Active Self  traZODone (DESYREL)  100 MG tablet [id number] Yes TAKE 1 TABLET BY MOUTH EVERYDAY AT BEDTIME  Patient taking differently:  Take 100 mg by mouth at bedtime.    [name], MD Taking Active Self          ASSESSMENT: Date Discharged from Hospital: 09/19/18 Date Medication Reconciliation Performed: 09/22/2018  New medications prescribed at discharge: Oxycodone-APAP  Patient was recently discharged from  hospital and all medications have been reviewed   Drugs sorted by system:  Neurologic/Psychologic: Alprazolam, sertraline, trazodone  Cardiovascular: losartan-HCTZ, metoprolol, simvastatin   Pulmonary/Allergy: fexofenadine, fluticasone nasal spray  Gastrointestinal: pantoprazole   Endocrine: metformin  Pain: oxycodone-APAP, methocarbamol  Vitamins/Minerals: vitamin D3, multivitamin   Duplications in therapy: none, patient has discontinued hydrocodone and tramadol since prescribed oxycodone post surgery.  Medications to avoid in the elderly: Per the [place] List, alprazolam may have decreased metabolism and increases sensitivity in older adults.  Risk of cognitive impairment, delirium, falls, fractures and motor vehicle accidents may occur. There is strong evidence to avoid use in the elderly. Drug interactions: methocarbamol, oxycodone, trazodone, and alprazolam are CNS depressants which combined use can increase risk of cognitive impairment, delirium, and falls.   Other findings: Patient checks blood glucose twice daily and reports fasting numbers between 100-120. Patient denies blood glucose less than 70 and has a plan if she does experience hypoglycemia. Patient reports she is going to classes at [facility] in December to learn more about diabetes. Patient was offered [facility]'s diabetic coaching services but declined at this time.   Patient reports pain is controlled well and she is not using the maximum amount of pain medication prescribed. She was counseled on the signs and symptoms of accidental over dose and when to seek emergency services. Patient verbalized understanding.   PLAN: -Instructed patient to take medications as prescribed and discontinue old medications as prescribed   Isaias Sakai, Pharm D PGY1 Pharmacy Resident  09/22/2018      10:59 AM

## 2018-09-24 ENCOUNTER — Other Ambulatory Visit: Payer: Self-pay | Admitting: Internal Medicine

## 2018-09-25 ENCOUNTER — Encounter: Payer: Self-pay | Admitting: Internal Medicine

## 2018-09-25 ENCOUNTER — Other Ambulatory Visit: Payer: Self-pay | Admitting: Internal Medicine

## 2018-09-25 NOTE — Telephone Encounter (Signed)
Ok to refill 

## 2018-09-26 ENCOUNTER — Ambulatory Visit: Payer: Self-pay

## 2018-09-26 NOTE — Telephone Encounter (Signed)
Pt. Reports she had back surgery 09/15/18 with skin glue closure. Has skin irritation and itching ever since they removed the honeycomb dressing. The surgeon told her to apply Benadryl cream. No relief. Hydrocortisone has not helped. Reports the only relief she gets is in the shower. No appointments available today. States the skin is reddened at site.Does not know what else to try. Please advise. Judson Roch will forward over to Dr. Nicki Reaper.Contact number [phone].  Reason for Disposition . [1] MODERATE-SEVERE local itching (i.e., interferes with work, school, activities) AND [2] not improved after 24 hours of hydrocortisone cream  Answer Assessment - Initial Assessment Questions 1. DESCRIPTION: "Describe the itching you are having." "Where is it located?"     Surgical incision to back 2. SEVERITY: "How bad is it?"    - MILD - doesn't interfere with normal activities   - MODERATE - SEVERE: interferes with work, school, sleep, or other activities      8-9 3. SCRATCHING: "Are there any scratch marks? Bleeding?"     No 4. ONSET: "When did the itching begin?"      4 days 5. CAUSE: "What do you think is causing the itching?"      Maybe reacted to the honeycomb dressing 6. OTHER SYMPTOMS: "Do you have any other symptoms?"      No 7. PREGNANCY: "Is there any chance you are pregnant?" "When was your last menstrual period?"     No  Protocols used: ITCHING - LOCALIZED-A-AH

## 2018-09-26 NOTE — Telephone Encounter (Signed)
Is there something that we can recommend OTC or should patient contact her surgeon again?

## 2018-09-26 NOTE — Telephone Encounter (Signed)
Please let her know that I just received her message today.  If hydrocortisone cream has not helped, she needs to contact the surgeon to see what else to try. If persistent problems, will need to be evaluated.

## 2018-09-26 NOTE — Telephone Encounter (Signed)
Advised patient of below. Patient stated she will try the 1% Hydrocortisone cream and see if that helps.

## 2018-09-30 ENCOUNTER — Encounter: Payer: Self-pay | Admitting: Emergency Medicine

## 2018-09-30 ENCOUNTER — Emergency Department
Admission: EM | Admit: 2018-09-30 | Discharge: 2018-09-30 | Disposition: A | Discharge status: Home / Self Care | Payer: PPO | Attending: Emergency Medicine | Admitting: Emergency Medicine

## 2018-09-30 ENCOUNTER — Other Ambulatory Visit: Payer: Self-pay

## 2018-09-30 ENCOUNTER — Emergency Department: Payer: PPO

## 2018-09-30 DIAGNOSIS — Z981 Arthrodesis status: Secondary | ICD-10-CM | POA: Insufficient documentation

## 2018-09-30 DIAGNOSIS — R32 Unspecified urinary incontinence: Secondary | ICD-10-CM | POA: Diagnosis not present

## 2018-09-30 DIAGNOSIS — I10 Essential (primary) hypertension: Secondary | ICD-10-CM | POA: Insufficient documentation

## 2018-09-30 DIAGNOSIS — E119 Type 2 diabetes mellitus without complications: Secondary | ICD-10-CM | POA: Diagnosis not present

## 2018-09-30 DIAGNOSIS — Z79899 Other long term (current) drug therapy: Secondary | ICD-10-CM | POA: Diagnosis not present

## 2018-09-30 DIAGNOSIS — R159 Full incontinence of feces: Secondary | ICD-10-CM | POA: Insufficient documentation

## 2018-09-30 DIAGNOSIS — Z7984 Long term (current) use of oral hypoglycemic drugs: Secondary | ICD-10-CM | POA: Insufficient documentation

## 2018-09-30 DIAGNOSIS — M96842 Postprocedural seroma of a musculoskeletal structure following a musculoskeletal system procedure: Secondary | ICD-10-CM | POA: Insufficient documentation

## 2018-09-30 DIAGNOSIS — M545 Low back pain: Secondary | ICD-10-CM | POA: Diagnosis present

## 2018-09-30 LAB — CBC WITH DIFFERENTIAL/PLATELET
Abs Immature Granulocytes: 0.03 10*3/uL (ref 0.00–0.07)
Basophils Absolute: 0.1 10*3/uL (ref 0.0–0.1)
Basophils Relative: 1 %
Eosinophils Absolute: 0.3 10*3/uL (ref 0.0–0.5)
Eosinophils Relative: 4 %
HCT: 33.1 % — ABNORMAL LOW (ref 36.0–46.0)
Hemoglobin: 10.8 g/dL — ABNORMAL LOW (ref 12.0–15.0)
Immature Granulocytes: 0 %
Lymphocytes Relative: 25 %
Lymphs Abs: 1.7 10*3/uL (ref 0.7–4.0)
MCH: 30.3 pg (ref 26.0–34.0)
MCHC: 32.6 g/dL (ref 30.0–36.0)
MCV: 93 fL (ref 80.0–100.0)
Monocytes Absolute: 0.7 10*3/uL (ref 0.1–1.0)
Monocytes Relative: 10 %
Neutro Abs: 4.1 10*3/uL (ref 1.7–7.7)
Neutrophils Relative %: 60 %
Platelets: 460 10*3/uL — ABNORMAL HIGH (ref 150–400)
RBC: 3.56 MIL/uL — ABNORMAL LOW (ref 3.87–5.11)
RDW: 15.5 % (ref 11.5–15.5)
WBC: 6.9 10*3/uL (ref 4.0–10.5)
nRBC: 0.3 % — ABNORMAL HIGH (ref 0.0–0.2)

## 2018-09-30 LAB — URINALYSIS, COMPLETE (UACMP) WITH MICROSCOPIC
Bacteria, UA: NONE SEEN
Bilirubin Urine: NEGATIVE
Glucose, UA: NEGATIVE mg/dL
Hgb urine dipstick: NEGATIVE
Ketones, ur: 5 mg/dL — AB
Leukocytes, UA: NEGATIVE
Nitrite: NEGATIVE
Protein, ur: NEGATIVE mg/dL
Specific Gravity, Urine: 1.012 (ref 1.005–1.030)
pH: 7 (ref 5.0–8.0)

## 2018-09-30 LAB — COMPREHENSIVE METABOLIC PANEL WITH GFR
ALT: 20 U/L (ref 0–44)
AST: 28 U/L (ref 15–41)
Albumin: 4.2 g/dL (ref 3.5–5.0)
Alkaline Phosphatase: 79 U/L (ref 38–126)
Anion gap: 13 (ref 5–15)
BUN: 17 mg/dL (ref 8–23)
CO2: 24 mmol/L (ref 22–32)
Calcium: 10 mg/dL (ref 8.9–10.3)
Chloride: 104 mmol/L (ref 98–111)
Creatinine, Ser: 0.85 mg/dL (ref 0.44–1.00)
GFR calc Af Amer: >60 mL/min
GFR calc non Af Amer: >60 mL/min
Glucose, Bld: 130 mg/dL — ABNORMAL HIGH (ref 70–99)
Potassium: 3.4 mmol/L — ABNORMAL LOW (ref 3.5–5.1)
Sodium: 141 mmol/L (ref 135–145)
Total Bilirubin: 0.9 mg/dL (ref 0.3–1.2)
Total Protein: 7.4 g/dL (ref 6.5–8.1)

## 2018-09-30 MED ORDER — SODIUM CHLORIDE 0.9 % IV BOLUS
1000.0000 mL | Freq: Once | INTRAVENOUS | Status: AC
  Administered 2018-09-30: 1000 mL via INTRAVENOUS

## 2018-09-30 MED ORDER — FENTANYL CITRATE (PF) 100 MCG/2ML IJ SOLN
50.0000 ug | Freq: Once | INTRAMUSCULAR | Status: AC
  Administered 2018-09-30: 50 ug via INTRAVENOUS
  Filled 2018-09-30: qty 2

## 2018-09-30 MED ORDER — DEXAMETHASONE 2 MG PO TABS: 2.0000 mg | ORAL_TABLET | Freq: Every day | ORAL | 0 refills | Status: DC

## 2018-09-30 MED ORDER — LORAZEPAM 1 MG PO TABS
1.0000 mg | ORAL_TABLET | Freq: Once | ORAL | Status: AC
  Administered 2018-09-30: 1 mg via ORAL
  Filled 2018-09-30: qty 1

## 2018-09-30 MED ORDER — HYDROMORPHONE HCL 1 MG/ML IJ SOLN
1.0000 mg | Freq: Once | INTRAMUSCULAR | Status: AC
  Administered 2018-09-30: 1 mg via INTRAVENOUS
  Filled 2018-09-30: qty 1

## 2018-09-30 MED ORDER — HYDROMORPHONE HCL 2 MG PO TABS: 2.0000 mg | ORAL_TABLET | Freq: Four times a day (QID) | ORAL | 0 refills | Status: DC | PRN

## 2018-09-30 MED ORDER — DEXAMETHASONE SODIUM PHOSPHATE 10 MG/ML IJ SOLN
10.0000 mg | Freq: Once | INTRAMUSCULAR | Status: AC
  Administered 2018-09-30: 10 mg via INTRAVENOUS
  Filled 2018-09-30: qty 1

## 2018-09-30 MED ORDER — ONDANSETRON HCL 4 MG/2ML IJ SOLN
4.0000 mg | Freq: Once | INTRAMUSCULAR | Status: AC
  Administered 2018-09-30: 4 mg via INTRAVENOUS
  Filled 2018-09-30: qty 2

## 2018-09-30 NOTE — ED Provider Notes (Signed)
[facility] Emergency Department Provider Note  ____________________________________________  Time seen: Approximately 5:25 PM  I have reviewed the triage vital signs and the nursing notes.   HISTORY  Chief Complaint Back Pain    HPI Erin Camacho is a 72 y.o. female patient presents the emergency department with increasing lower back pain, bowel and bladder incontinence.  Patient had lumbar and thoracic fusion on 09/15/2018.  Patient had some mild complications including anemia from blood loss, as well as a pulled lumbar paraspinal muscle status post surgery.  Patient states that after discharge, she was doing well at home.  Over the past 2 to 3 days she has had sudden worsening of lumbar back pain.  Patient denies any trauma.  She denies any dysuria, polyuria, hematuria.  Patient reports that she had some mild urinary incontinence prior to her surgery which had resolved after surgery.  Patient reports that today she began to have urinary as well as bowel incontinence.  Patient reports that pain is severe, sharp, as well as spasmodic.  Patient denies any loss of sensation in her lower extremities.  She denies any headache, neck pain, chest pain, shortness of breath, palpitations, abdominal pain.    Past Medical History:  Diagnosis Date  . Allergy    hay fever  . Anxiety   . Arthritis   . Chronic back pain   . Diabetes mellitus    no meds      pt states she is not diabetic  . Dyspnea   . GERD (gastroesophageal reflux disease)   . History of hiatal hernia   . Hyperlipidemia   . Hypertension   . Pneumonia   . Spinal stenosis   . UTI (lower urinary tract infection)     Patient Active Problem List   Diagnosis Date Noted  . Spinal stenosis 09/15/2018  . Lumbar stenosis with neurogenic claudication 09/15/2018  . Shortness of breath 06/19/2018  . Cough 04/13/2018  . Aortic atherosclerosis ([place]) 01/11/2018  . GERD (gastroesophageal reflux disease) 12/23/2016   . Atrophic vaginitis 03/18/2016  . Recurrent UTI 02/20/2016  . Spondylolisthesis at L5-S1 level 12/06/2015  . Preop examination 11/22/2015  . Rhinitis, allergic 11/22/2015  . Right leg pain 07/27/2014  . 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 Bilateral shoulder pain 02/24/2014  . Skin lesion 01/18/2014  . History of bariatric surgery 01/18/2014  . Back pain 05/25/2013  . Major depression 05/14/2013  . Major depressive disorder with single episode 05/14/2013  . Status post lumbar spine operation 04/17/2013  . Healthcare maintenance 01/07/2013  . Hypercholesterolemia 08/22/2012  . HLD (hyperlipidemia) 08/22/2012  . Hypertension 07/07/2012  . Diabetes mellitus type 2, diet-controlled ([place]) 07/07/2012  . Insomnia 07/07/2012  . Obesity (BMI 30-39.9) 07/07/2012  . Essential (primary) hypertension 07/07/2012    Past Surgical History:  Procedure Laterality Date  . ABDOMINAL HYSTERECTOMY  1973  . ABDOMINAL HYSTERECTOMY    . APPENDECTOMY    . APPLICATION OF ROBOTIC ASSISTANCE FOR SPINAL PROCEDURE N/A 09/15/2018   Procedure: APPLICATION OF ROBOTIC ASSISTANCE FOR SPINAL PROCEDURE;  Surgeon: Kristeen Miss, MD;  Location: [facility];  Service: Neurosurgery;  Laterality: N/A;  . BACK SURGERY  04/17/13, 2017  . CHOLECYSTECTOMY  1995  . HERNIA REPAIR  08/2006  . lap band  08/2003  . SEPTOPLASTY  04/1978  . TONSILLECTOMY AND ADENOIDECTOMY  1979  . TOTAL HIP ARTHROPLASTY  02/2012   Right, Dr. Marry Guan    Prior to Admission  medications   Medication Sig Start Date End Date Taking? Authorizing Provider  ALPRAZolam (XANAX) 0.25 MG tablet Take 1 tablet (0.25 mg total) by mouth daily as needed for anxiety. 08/22/18   [name], MD  Cholecalciferol (VITAMIN D3) 2000 units TABS Take 2,000 Units by mouth daily.    [provider]  dexamethasone (DECADRON) 2 MG tablet Take 1 tablet (2 mg total) by mouth daily for 10 days. 09/30/18 10/10/18   Maritsa Hunsucker, Charline Bills, PA-C  fexofenadine (ALLEGRA) 180 MG tablet Take 1 tablet (180 mg total) by mouth daily. 01/18/14   Jackolyn Confer, MD  fluticasone (FLONASE) 50 MCG/ACT nasal spray INSTILL 2 SPRAYS INTO EACH NOSTRIL EVERY DAY 09/22/18   [name], MD  fluticasone [facility]) 50 MCG/ACT nasal spray INSTILL 2 SPRAYS INTO EACH NOSTRIL EVERY DAY 09/24/18   [name], MD  HYDROmorphone (DILAUDID) 2 MG tablet Take 1 tablet (2 mg total) by mouth every 6 (six) hours as needed for severe pain. 09/30/18 09/30/19  Chieko Neises, Charline Bills, PA-C  Liniments (SALONPAS EX) Apply 1 application topically daily.    [provider]  losartan-hydrochlorothiazide (HYZAAR) 50-12.5 MG tablet Take 1 tablet by mouth daily. 04/10/18   [name], MD  metFORMIN (GLUCOPHAGE) 500 MG tablet Take 1 tablet (500 mg total) by mouth daily. 08/22/18   [name], MD  methocarbamol (ROBAXIN) 500 MG tablet Take 1 tablet (500 mg total) by mouth every 6 (six) hours as needed for muscle spasms. 09/19/18   Kristeen Miss, MD  metoprolol succinate (TOPROL-XL) 25 MG 24 hr tablet TAKE ONE 1/2 tablet per day. Patient taking differently: Take 12.5 mg by mouth daily.  10/17/17   [name], MD  Multiple Vitamin (MULTIVITAMIN) tablet Take 1 tablet by mouth daily.    [provider]  oxyCODONE-acetaminophen (PERCOCET/ROXICET) 5-325 MG tablet Take 1-2 tablets by mouth every 3 (three) hours as needed for severe pain. 09/19/18   Kristeen Miss, MD  pantoprazole (PROTONIX) 40 MG tablet TAKE 1 TABLET (40 MG TOTAL) BY MOUTH 2 (TWO) TIMES DAILY BEFORE A MEAL. 09/03/18   [name], MD  sertraline (ZOLOFT) 100 MG tablet TAKE 1/2 TABLET BY MOUTH (50 MG) BY MOUTH DAILY Patient taking differently: Take 50 mg by mouth daily.  08/05/18   [name], MD  simvastatin (ZOCOR) 10 MG tablet TAKE ONE-HALF TABLET (5 MG TOTAL) BY MOUTH AT BEDTIME. Patient taking differently: Take 5 mg by mouth at bedtime.  05/06/18    [name], MD  traZODone (DESYREL) 100 MG tablet TAKE 1 TABLET BY MOUTH EVERYDAY AT BEDTIME 09/25/18   [name], MD    Allergies Adhesive [tape]  Family History  Problem Relation Age of Onset  . Hypertension Mother   . Stroke Mother   . Heart disease Mother   . Hypertension Father   . Stroke Father   . Heart disease Father   . Arthritis Sister   . Diabetes Sister   . Dementia Sister   . Cancer Sister   . Diabetes Brother   . Hypertension Brother   . Diabetes Brother   . Hypertension Brother   . Breast cancer Other 47  . Cancer Other        breast  . Diabetes Other   . Kidney disease Neg Hx   . Bladder Cancer Neg Hx     Social History Social History   Tobacco Use  . Smoking status: Never Smoker  . Smokeless tobacco: Never Used  Substance Use Topics  . Alcohol  use: Yes    Alcohol/week: 1.0 standard drinks    Types: 1 Glasses of wine per week    Comment: occ  . Drug use: No     Review of Systems  Constitutional: No fever/chills Eyes: No visual changes. No discharge ENT: No upper respiratory complaints. Cardiovascular: no chest pain. Respiratory: no cough. No SOB. Gastrointestinal: No abdominal pain.  No nausea, no vomiting.  No diarrhea.  No constipation. Genitourinary: Negative for dysuria. No hematuria Musculoskeletal: Positive for sharply increasing lower back pain, bowel and bladder incontinence. Skin: Negative for rash, abrasions, lacerations, ecchymosis. Neurological: Negative for headaches, focal weakness or numbness. 10-point ROS otherwise negative.  ____________________________________________   PHYSICAL EXAM:  VITAL SIGNS: ED Triage Vitals  Enc Vitals Group     BP 09/30/18 1700 (!) 171/89     Pulse Rate 09/30/18 1700 81     Resp 09/30/18 1700 20     Temp 09/30/18 1700 98.7 F (37.1 C)     Temp Source 09/30/18 1700 Oral     SpO2 09/30/18 1700 98 %     Weight 09/30/18 1701 218 lb (98.9 kg)     Height 09/30/18 1701 5\' 5"   (1.651 m)     Head Circumference --      Peak Flow --      Pain Score 09/30/18 1701 10     Pain Loc --      Pain Edu? --      Excl. in [place]? --      Constitutional: Alert and oriented. Well appearing and in no acute distress. Eyes: Conjunctivae are normal. PERRL. EOMI. Head: Atraumatic. Neck: No stridor.  No cervical spine tenderness to palpation.  Cardiovascular: Normal rate, regular rhythm. Normal S1 and S2.  Good peripheral circulation. Respiratory: Normal respiratory effort without tachypnea or retractions. Lungs CTAB. Good air entry to the bases with no decreased or absent breath sounds. Gastrointestinal: Bowel sounds 4 quadrants. Soft and nontender to palpation. No guarding or rigidity. No palpable masses. No distention. No CVA tenderness. Musculoskeletal: Full range of motion to all extremities. No gross deformities appreciated.  Visualization of the lumbar spine reveals surgical incision.  No evidence of dehiscence or surrounding erythema or edema concerning for infection.  Patient is tender to palpation over the L2 distribution.  She is diffusely tender to palpation throughout the lumbar paraspinal muscle region.  Patient is nontender elsewhere over the thoracic and lumbar spine.  No palpable abnormality or step-off.  No tenderness to palpation over bilateral sciatic notches.  Dorsalis pedis pulse intact bilateral lower extremities.  Sensation intact and equal in all dermatomal distributions bilaterally. Neurologic:  Normal speech and language. No gross focal neurologic deficits are appreciated.  Skin:  Skin is warm, dry and intact. No rash noted. Psychiatric: Mood and affect are normal. Speech and behavior are normal. Patient exhibits appropriate insight and judgement.   ____________________________________________   LABS (all labs ordered are listed, but only abnormal results are displayed)  Labs Reviewed  COMPREHENSIVE METABOLIC PANEL - Abnormal; Notable for the following  components:      Result Value   Potassium 3.4 (*)    Glucose, Bld 130 (*)    All other components within normal limits  CBC WITH DIFFERENTIAL/PLATELET - Abnormal; Notable for the following components:   RBC 3.56 (*)    Hemoglobin 10.8 (*)    HCT 33.1 (*)    Platelets 460 (*)    nRBC 0.3 (*)    All other components within normal limits  URINALYSIS, COMPLETE (UACMP) WITH MICROSCOPIC - Abnormal; Notable for the following components:   Color, Urine YELLOW (*)    APPearance CLEAR (*)    Ketones, ur 5 (*)    All other components within normal limits   ____________________________________________  EKG   ____________________________________________  RADIOLOGY I personally viewed and evaluated these images as part of my medical decision making, as well as reviewing the written report by the radiologist.  Dg Lumbar Spine Complete  Result Date: 09/30/2018 CLINICAL DATA:  Severe back pain. History of lumbar fusion on 09/15/2018. EXAM: LUMBAR SPINE - COMPLETE 4+ VIEW COMPARISON:  Intraoperative lumbar spine radiographs-09/15/2018; lumbar spine CT-08/06/2018 FINDINGS: Stable sequela of cranial extension of previously noted paraspinal lumbar fusion hardware. Paraspinal fusion hardware is now extending from T10 through S1 with bilateral pedicular screws seen at all levels. Post L1-L2 intervertebral disc space replacement with restoration of the L1-L2 intervertebral disc space height. Stable sequela of L2-L3, L3-L4, L4-L5 and L5-S1 intervertebral disc space replacement. Cement material is noted about the T10, T11, T12 and L1 pedicular screws. A small amount of cement is seen within one of the T11 paraspinal lumbar veins. Limited visualization the bilateral SI joints is normal. Scattered calcifications within the abdominal aorta. Post cholecystectomy. Sequela of prior gastric lap band procedure. IMPRESSION: Post cranial extension of now long segment T10-L1 paraspinal fusion and multilevel cement  augmentation and intervertebral disc space replacement without definite evidence of hardware failure or loosening. If concern persists, further evaluation with MRI could be performed as indicated. Electronically Signed   By: Sandi Mariscal M.D.   On: 09/30/2018 18:36   Mr Lumbar Spine Wo Contrast  Result Date: 09/30/2018 CLINICAL DATA:  Acute onset low back pain. Status post lumbar spine surgery September 15, 2018. Bowel and bladder incontinence, suspect cauda equina syndrome. EXAM: MRI LUMBAR SPINE WITHOUT CONTRAST TECHNIQUE: Multiplanar, multisequence MR imaging of the lumbar spine was performed. No intravenous contrast was administered. COMPARISON:  Lumbar spine radiographs July 31, 2018 and MRI lumbar spine July 20, 2018 FINDINGS: SEGMENTATION: For the purposes of this report, the last well-formed intervertebral disc is reported as L5-S1. ALIGNMENT: Maintained lumbar lordosis. Stable grade 1 L5-S1 anterolisthesis. VERTEBRAE:Vertebral bodies are intact. New L1-2 disc prosthesis with cephalad extension of spinal fusion, status post thoracolumbar PLIF, revised at L1. Pedicle screws result in susceptibility artifact. Bridging bone marrow signal L2-3 through L5-S1 compatible with arthrodesis. Moderate T11-12 disc height loss similar to prior examination with mild desiccation of the nonsurgically altered thoracic discs. No suspicious or acute bone marrow signal. Central low signal within T11 through L1 consistent with vertebral body cement augmentation. CONUS MEDULLARIS AND CAUDA EQUINA: Conus medullaris terminates at L1-2, no abnormal signal though hardware artifact limits assessment. No nerve root clumping. PARASPINAL AND OTHER SOFT TISSUES: Severe paraspinal muscle postoperative denervation. Ill-defined low T1, bright T2 2.9 x 4 cm fluid collection extending 12.4 cm from T11-L3 within the subcutaneous fat along surgical approach. DISC LEVELS: Axial images not obtained through thoracic spine. No canal  stenosis. No high-grade neural foraminal narrowing the limited by hardware artifact. L1-2: Disc prosthesis. Posterior decompression. No canal stenosis or neural foraminal narrowing, improved from prior examination. L2-3, L3-4, L4-5: PLIF. Posterior decompression without canal stenosis or neural foraminal narrowing. L5-S1: Anterolisthesis. PLIF. No canal stenosis. Similar mild RIGHT neural foraminal narrowing. IMPRESSION: 1. Thoracolumbar spinal fusion, revised at L1 with new thoracic component, status post included thoracic and L1 cement augmentation. Postoperative paraspinal seroma, unlikely to reflect abscess or pseudomeningocele. 2. No acute fracture.  Chronic grade 1 L5-S1 anterolisthesis. 3. No canal stenosis. Mild similar RIGHT L5-S1 neural foraminal narrowing. Electronically Signed   By: Elon Alas M.D.   On: 09/30/2018 21:57    ____________________________________________    PROCEDURES  Procedure(s) performed:    Procedures    Medications  sodium chloride 0.9 % bolus 1,000 mL (0 mLs Intravenous Stopped 09/30/18 2012)  fentaNYL (SUBLIMAZE) injection 50 mcg (50 mcg Intravenous Given 09/30/18 1747)  ondansetron (ZOFRAN) injection 4 mg (4 mg Intravenous Given 09/30/18 1747)  HYDROmorphone (DILAUDID) injection 1 mg (1 mg Intravenous Given 09/30/18 1906)  HYDROmorphone (DILAUDID) injection 1 mg (1 mg Intravenous Given 09/30/18 2025)  LORazepam (ATIVAN) tablet 1 mg (1 mg Oral Given 09/30/18 2025)  dexamethasone (DECADRON) injection 10 mg (10 mg Intravenous Given 09/30/18 2251)  HYDROmorphone (DILAUDID) injection 1 mg (1 mg Intravenous Given 09/30/18 2251)     ____________________________________________   INITIAL IMPRESSION / ASSESSMENT AND PLAN / ED COURSE  Pertinent labs & imaging results that were available during my care of the patient were reviewed by me and considered in my medical decision making (see chart for details).  Review of the [place] CSRS was performed in  accordance of the [place] prior to dispensing any controlled drugs.  Clinical Course as of Oct 02 47  Tue Sep 30, 2018  1739 Patient presents the emergency department with a sharp increase of lumbar back pain status post thoracic and lumbar fusion from T10-L2.  Patient surgery was on 09/15/2018, 2 weeks prior to arrival in the emergency department.  Patient has had worsening back pain, not dramatically over the past 2 to 3 days.  Today patient began to experience urinary and bowel incontinence.  On exam, patient is tender over L2.  Diffuse tenderness through bilateral paraspinal muscle region.  Given findings, patient will be evaluated with x-ray, basic labs.  Differential includes hardware malfunction, cauda equina, postoperative complications, lumbar spasms, UTI.  Also on the differential, much less likely includes aneurysm, ischemia, surgical site infection.  X-ray will be ordered initially, given patient's symptoms patient will likely be sent MRI as well.   [JC]  2017 Prior to MRI, patient is given a repeat dose of pain medication.  Patient is also extremely anxious about MRI.  Patient is given 1 mg Ativan orally for anxiolytic purposes.   [JC]    Clinical Course User Index [JC] Haskell Rihn, Charline Bills, PA-C     Patient's diagnosis is consistent with seroma status post lumbar surgery.  Patient presented to the emergency department 2 weeks out from T10-L2 fusion.  Patient was experiencing significant amounts of lower back pain.  Patient had bowel and bladder incontinence.  No significant radiculopathies.  Given patient's recent surgery, patient was evaluated with labs, x-ray.  These returned with reassuring results.  MRI was ordered which revealed seroma consistent with postoperative condition.  Patient was managed throughout her stay with multiple doses of pain medication.  Initially, patient was 10 out of 10 pain, on discharge her pain level had dropped to 3 out of 10 with multiple pain medication  dosing.  Given finding of seroma, patient was given dexamethasone, will be discharged home with daily dexamethasone as well.  Patient is given a prescription for Dilaudid tablets for pain.  Patient is instructed to follow-up with neurosurgeon in the morning.  She verbalizes understanding of her diagnosis, discharge instructions.  Follow-up with neurosurgery in the morning..  Patient is given instructions to return to the emergency department for any new or worsening symptoms.  ____________________________________________  FINAL CLINICAL IMPRESSION(S) / ED DIAGNOSES  Final diagnoses:  Postoperative seroma of musculoskeletal structure after musculoskeletal procedure      NEW MEDICATIONS STARTED DURING THIS VISIT:  ED Discharge Orders         Ordered    dexamethasone (DECADRON) 2 MG tablet  Daily,   Status:  Discontinued     09/30/18 2250    HYDROmorphone (DILAUDID) 2 MG tablet  Every 6 hours PRN     09/30/18 2250    dexamethasone (DECADRON) 2 MG tablet  Daily     09/30/18 2250              This chart was dictated using voice recognition software/Dragon. Despite best efforts to proofread, errors can occur which can change the meaning. Any change was purely unintentional.    Darletta Moll, PA-C 10/01/18 0048    Nena Polio, MD 10/08/18 1719

## 2018-09-30 NOTE — ED Notes (Signed)
FIRST NURSE NOTE: Pt arrived via w/c from [facility] with reports of severe back pain that started 3 hours ago, pt had lumbar fusion on 10/28 and has current TLSO brace on.  Pt took Robaxin and Oxycodone around 130pm today with no relief.

## 2018-09-30 NOTE — ED Notes (Signed)
See triage note  Presents with family with back spasms  States she had surgery about 3 weeks ago in [facility] increased pain about 3 hours PTA  States she did take robaxin and oxycodone w/o relief

## 2018-09-30 NOTE — ED Triage Notes (Signed)
Pt to ED c/o back pain that got worse after muscles spasms on both sides of the incision. States back surgery October 28th, took two oxycodone and robaxin around 1330 today without relief.  Pt ambulatory to triage.

## 2018-09-30 NOTE — ED Notes (Signed)
Pt is ambulatory in triage.

## 2018-10-01 DIAGNOSIS — J301 Allergic rhinitis due to pollen: Secondary | ICD-10-CM | POA: Diagnosis not present

## 2018-10-03 ENCOUNTER — Encounter (HOSPITAL_COMMUNITY): Payer: Self-pay | Admitting: Emergency Medicine

## 2018-10-03 ENCOUNTER — Other Ambulatory Visit: Payer: Self-pay

## 2018-10-03 ENCOUNTER — Other Ambulatory Visit: Payer: Self-pay | Admitting: Internal Medicine

## 2018-10-03 ENCOUNTER — Observation Stay (HOSPITAL_COMMUNITY)
Admission: EM | Admit: 2018-10-03 | Discharge: 2018-10-04 | Disposition: A | Discharge status: Home / Self Care | Payer: PPO | Attending: Internal Medicine | Admitting: Internal Medicine

## 2018-10-03 DIAGNOSIS — Z79899 Other long term (current) drug therapy: Secondary | ICD-10-CM | POA: Diagnosis not present

## 2018-10-03 DIAGNOSIS — Z9049 Acquired absence of other specified parts of digestive tract: Secondary | ICD-10-CM | POA: Insufficient documentation

## 2018-10-03 DIAGNOSIS — R0600 Dyspnea, unspecified: Secondary | ICD-10-CM | POA: Insufficient documentation

## 2018-10-03 DIAGNOSIS — Z823 Family history of stroke: Secondary | ICD-10-CM | POA: Insufficient documentation

## 2018-10-03 DIAGNOSIS — Z96641 Presence of right artificial hip joint: Secondary | ICD-10-CM | POA: Insufficient documentation

## 2018-10-03 DIAGNOSIS — M1990 Unspecified osteoarthritis, unspecified site: Secondary | ICD-10-CM | POA: Insufficient documentation

## 2018-10-03 DIAGNOSIS — E119 Type 2 diabetes mellitus without complications: Secondary | ICD-10-CM | POA: Diagnosis not present

## 2018-10-03 DIAGNOSIS — M5442 Lumbago with sciatica, left side: Secondary | ICD-10-CM | POA: Diagnosis present

## 2018-10-03 DIAGNOSIS — Z981 Arthrodesis status: Secondary | ICD-10-CM | POA: Insufficient documentation

## 2018-10-03 DIAGNOSIS — Z8261 Family history of arthritis: Secondary | ICD-10-CM | POA: Insufficient documentation

## 2018-10-03 DIAGNOSIS — K219 Gastro-esophageal reflux disease without esophagitis: Secondary | ICD-10-CM | POA: Diagnosis not present

## 2018-10-03 DIAGNOSIS — Z6836 Body mass index (BMI) 36.0-36.9, adult: Secondary | ICD-10-CM | POA: Insufficient documentation

## 2018-10-03 DIAGNOSIS — E7800 Pure hypercholesterolemia, unspecified: Secondary | ICD-10-CM | POA: Diagnosis present

## 2018-10-03 DIAGNOSIS — Z803 Family history of malignant neoplasm of breast: Secondary | ICD-10-CM | POA: Insufficient documentation

## 2018-10-03 DIAGNOSIS — M48062 Spinal stenosis, lumbar region with neurogenic claudication: Secondary | ICD-10-CM | POA: Diagnosis not present

## 2018-10-03 DIAGNOSIS — Z820 Family history of epilepsy and other diseases of the nervous system: Secondary | ICD-10-CM | POA: Insufficient documentation

## 2018-10-03 DIAGNOSIS — Z8249 Family history of ischemic heart disease and other diseases of the circulatory system: Secondary | ICD-10-CM | POA: Diagnosis not present

## 2018-10-03 DIAGNOSIS — F329 Major depressive disorder, single episode, unspecified: Secondary | ICD-10-CM | POA: Diagnosis not present

## 2018-10-03 DIAGNOSIS — Z888 Allergy status to other drugs, medicaments and biological substances status: Secondary | ICD-10-CM | POA: Insufficient documentation

## 2018-10-03 DIAGNOSIS — I10 Essential (primary) hypertension: Secondary | ICD-10-CM | POA: Diagnosis not present

## 2018-10-03 DIAGNOSIS — Z90710 Acquired absence of both cervix and uterus: Secondary | ICD-10-CM | POA: Diagnosis not present

## 2018-10-03 DIAGNOSIS — Z7984 Long term (current) use of oral hypoglycemic drugs: Secondary | ICD-10-CM | POA: Insufficient documentation

## 2018-10-03 DIAGNOSIS — Z833 Family history of diabetes mellitus: Secondary | ICD-10-CM | POA: Insufficient documentation

## 2018-10-03 DIAGNOSIS — M5441 Lumbago with sciatica, right side: Secondary | ICD-10-CM

## 2018-10-03 DIAGNOSIS — F419 Anxiety disorder, unspecified: Secondary | ICD-10-CM | POA: Diagnosis not present

## 2018-10-03 DIAGNOSIS — K449 Diaphragmatic hernia without obstruction or gangrene: Secondary | ICD-10-CM | POA: Insufficient documentation

## 2018-10-03 DIAGNOSIS — Z9884 Bariatric surgery status: Secondary | ICD-10-CM | POA: Diagnosis not present

## 2018-10-03 DIAGNOSIS — M545 Low back pain: Principal | ICD-10-CM | POA: Insufficient documentation

## 2018-10-03 DIAGNOSIS — E669 Obesity, unspecified: Secondary | ICD-10-CM | POA: Diagnosis not present

## 2018-10-03 DIAGNOSIS — I700 Atherosclerosis of aorta: Secondary | ICD-10-CM | POA: Diagnosis not present

## 2018-10-03 DIAGNOSIS — G9763 Postprocedural seroma of a nervous system organ or structure following a nervous system procedure: Secondary | ICD-10-CM | POA: Insufficient documentation

## 2018-10-03 DIAGNOSIS — E785 Hyperlipidemia, unspecified: Secondary | ICD-10-CM | POA: Insufficient documentation

## 2018-10-03 DIAGNOSIS — M961 Postlaminectomy syndrome, not elsewhere classified: Secondary | ICD-10-CM | POA: Diagnosis present

## 2018-10-03 DIAGNOSIS — M549 Dorsalgia, unspecified: Secondary | ICD-10-CM | POA: Diagnosis present

## 2018-10-03 LAB — COMPREHENSIVE METABOLIC PANEL WITH GFR
ALT: 19 U/L (ref 0–44)
AST: 25 U/L (ref 15–41)
Albumin: 4.1 g/dL (ref 3.5–5.0)
Alkaline Phosphatase: 76 U/L (ref 38–126)
Anion gap: 11 (ref 5–15)
BUN: 16 mg/dL (ref 8–23)
CO2: 25 mmol/L (ref 22–32)
Calcium: 10.1 mg/dL (ref 8.9–10.3)
Chloride: 103 mmol/L (ref 98–111)
Creatinine, Ser: 1.01 mg/dL — ABNORMAL HIGH (ref 0.44–1.00)
GFR calc Af Amer: >60 mL/min
GFR calc non Af Amer: 54 mL/min — ABNORMAL LOW
Glucose, Bld: 173 mg/dL — ABNORMAL HIGH (ref 70–99)
Potassium: 3.2 mmol/L — ABNORMAL LOW (ref 3.5–5.1)
Sodium: 139 mmol/L (ref 135–145)
Total Bilirubin: 0.8 mg/dL (ref 0.3–1.2)
Total Protein: 7 g/dL (ref 6.5–8.1)

## 2018-10-03 LAB — CBC WITH DIFFERENTIAL/PLATELET
Abs Immature Granulocytes: 0.01 10*3/uL (ref 0.00–0.07)
Basophils Absolute: 0 10*3/uL (ref 0.0–0.1)
Basophils Relative: 1 %
Eosinophils Absolute: 0 10*3/uL (ref 0.0–0.5)
Eosinophils Relative: 1 %
HCT: 36.5 % (ref 36.0–46.0)
Hemoglobin: 11.2 g/dL — ABNORMAL LOW (ref 12.0–15.0)
Immature Granulocytes: 0 %
Lymphocytes Relative: 19 %
Lymphs Abs: 1 10*3/uL (ref 0.7–4.0)
MCH: 28.8 pg (ref 26.0–34.0)
MCHC: 30.7 g/dL (ref 30.0–36.0)
MCV: 93.8 fL (ref 80.0–100.0)
Monocytes Absolute: 0.4 10*3/uL (ref 0.1–1.0)
Monocytes Relative: 8 %
Neutro Abs: 3.7 10*3/uL (ref 1.7–7.7)
Neutrophils Relative %: 71 %
Platelets: 452 10*3/uL — ABNORMAL HIGH (ref 150–400)
RBC: 3.89 MIL/uL (ref 3.87–5.11)
RDW: 15.7 % — ABNORMAL HIGH (ref 11.5–15.5)
WBC: 5.1 10*3/uL (ref 4.0–10.5)
nRBC: 0 % (ref 0.0–0.2)

## 2018-10-03 MED ORDER — GABAPENTIN 100 MG PO CAPS
100.0000 mg | ORAL_CAPSULE | Freq: Three times a day (TID) | ORAL | Status: DC
  Administered 2018-10-03 – 2018-10-04 (×3): 100 mg via ORAL
  Filled 2018-10-03 (×3): qty 1

## 2018-10-03 MED ORDER — ENOXAPARIN SODIUM 40 MG/0.4ML ~~LOC~~ SOLN
40.0000 mg | SUBCUTANEOUS | Status: DC
  Administered 2018-10-03: 40 mg via SUBCUTANEOUS
  Filled 2018-10-03: qty 0.4

## 2018-10-03 MED ORDER — DEXAMETHASONE SODIUM PHOSPHATE 10 MG/ML IJ SOLN
4.0000 mg | Freq: Three times a day (TID) | INTRAMUSCULAR | Status: DC
  Administered 2018-10-03 – 2018-10-04 (×3): 4 mg via INTRAVENOUS
  Filled 2018-10-03 (×3): qty 1

## 2018-10-03 MED ORDER — ALPRAZOLAM 0.25 MG PO TABS: 0.2500 mg | ORAL_TABLET | Freq: Every day | ORAL | Status: DC | PRN

## 2018-10-03 MED ORDER — OXYCODONE-ACETAMINOPHEN 5-325 MG PO TABS
1.0000 | ORAL_TABLET | ORAL | Status: DC | PRN
  Administered 2018-10-03: 1 via ORAL
  Administered 2018-10-03: 2 via ORAL
  Administered 2018-10-04: 1 via ORAL
  Filled 2018-10-03: qty 2
  Filled 2018-10-03 (×2): qty 1

## 2018-10-03 MED ORDER — SIMVASTATIN 5 MG PO TABS
5.0000 mg | ORAL_TABLET | Freq: Every day | ORAL | Status: DC
  Administered 2018-10-03: 5 mg via ORAL
  Filled 2018-10-03: qty 1

## 2018-10-03 MED ORDER — HYDROMORPHONE HCL 1 MG/ML IJ SOLN: 1.0000 mg | INTRAMUSCULAR | Status: DC | PRN

## 2018-10-03 MED ORDER — POTASSIUM CHLORIDE CRYS ER 20 MEQ PO TBCR
40.0000 meq | EXTENDED_RELEASE_TABLET | Freq: Once | ORAL | Status: AC
  Administered 2018-10-03: 40 meq via ORAL
  Filled 2018-10-03: qty 2

## 2018-10-03 MED ORDER — LIDOCAINE 5 % EX PTCH
1.0000 | MEDICATED_PATCH | CUTANEOUS | Status: DC
  Administered 2018-10-03: 1 via TRANSDERMAL
  Filled 2018-10-03: qty 1

## 2018-10-03 MED ORDER — VITAMIN D 25 MCG (1000 UNIT) PO TABS
2000.0000 [IU] | ORAL_TABLET | Freq: Every day | ORAL | Status: DC
  Administered 2018-10-04: 2000 [IU] via ORAL
  Filled 2018-10-03: qty 2

## 2018-10-03 MED ORDER — NALOXONE HCL 0.4 MG/ML IJ SOLN: 0.4000 mg | INTRAMUSCULAR | Status: DC | PRN

## 2018-10-03 MED ORDER — TIZANIDINE HCL 2 MG PO TABS
2.0000 mg | ORAL_TABLET | Freq: Three times a day (TID) | ORAL | Status: DC | PRN
  Administered 2018-10-03 – 2018-10-04 (×2): 2 mg via ORAL
  Filled 2018-10-03 (×3): qty 1

## 2018-10-03 MED ORDER — HYDROMORPHONE HCL 1 MG/ML IJ SOLN
1.0000 mg | Freq: Once | INTRAMUSCULAR | Status: AC
  Administered 2018-10-03: 1 mg via INTRAVENOUS
  Filled 2018-10-03: qty 1

## 2018-10-03 MED ORDER — TRAZODONE HCL 100 MG PO TABS
100.0000 mg | ORAL_TABLET | Freq: Every day | ORAL | Status: DC
  Administered 2018-10-03: 100 mg via ORAL
  Filled 2018-10-03: qty 1

## 2018-10-03 MED ORDER — PANTOPRAZOLE SODIUM 40 MG PO TBEC
40.0000 mg | DELAYED_RELEASE_TABLET | Freq: Two times a day (BID) | ORAL | Status: DC
  Administered 2018-10-03 – 2018-10-04 (×2): 40 mg via ORAL
  Filled 2018-10-03 (×2): qty 1

## 2018-10-03 MED ORDER — METFORMIN HCL 500 MG PO TABS
500.0000 mg | ORAL_TABLET | Freq: Every day | ORAL | Status: DC
  Administered 2018-10-03: 500 mg via ORAL
  Filled 2018-10-03: qty 1

## 2018-10-03 MED ORDER — SERTRALINE HCL 50 MG PO TABS
50.0000 mg | ORAL_TABLET | Freq: Every day | ORAL | Status: DC
  Administered 2018-10-04: 50 mg via ORAL
  Filled 2018-10-03: qty 1

## 2018-10-03 MED ORDER — LORATADINE 10 MG PO TABS
10.0000 mg | ORAL_TABLET | Freq: Every day | ORAL | Status: DC
  Administered 2018-10-04: 10 mg via ORAL
  Filled 2018-10-03: qty 1

## 2018-10-03 MED ORDER — FLUTICASONE PROPIONATE 50 MCG/ACT NA SUSP
2.0000 | Freq: Every day | NASAL | Status: DC
  Administered 2018-10-04: 2 via NASAL
  Filled 2018-10-03: qty 16

## 2018-10-03 MED ORDER — ACETAMINOPHEN 500 MG PO TABS
1000.0000 mg | ORAL_TABLET | Freq: Four times a day (QID) | ORAL | Status: DC | PRN
  Administered 2018-10-03: 1000 mg via ORAL
  Filled 2018-10-03: qty 2

## 2018-10-03 MED ORDER — ADULT MULTIVITAMIN W/MINERALS CH
1.0000 | ORAL_TABLET | Freq: Every day | ORAL | Status: DC
  Administered 2018-10-04: 1 via ORAL
  Filled 2018-10-03: qty 1

## 2018-10-03 MED ORDER — HYDROCHLOROTHIAZIDE 12.5 MG PO CAPS: 12.5000 mg | ORAL_CAPSULE | Freq: Every day | ORAL | Status: DC

## 2018-10-03 MED ORDER — LOSARTAN POTASSIUM-HCTZ 50-12.5 MG PO TABS: 1.0000 | ORAL_TABLET | Freq: Every day | ORAL | Status: DC

## 2018-10-03 MED ORDER — LOSARTAN POTASSIUM 50 MG PO TABS: 50.0000 mg | ORAL_TABLET | Freq: Every day | ORAL | Status: DC

## 2018-10-03 NOTE — ED Notes (Signed)
Pt ambulated to restroom from room, tolerated well. 

## 2018-10-03 NOTE — Consult Note (Signed)
Neurosurgery Consultation  Reason for Consult: Back pain Referring Physician: Earnest Conroy  CC: Back pain  HPI: This is a 72 y.o. woman that presents with worsening back pain. She recently had a lumbar decompression with extension of her prior fusion construct. She had post-operative pain that improved until a few days ago when it suddenly worsened. She reported some abnormality in bowel and bladder function then, but hasn't had that symptom since then. She went to the ED at that time and an MRI showed a post-operative seroma but no evidence of a compressive hematoma. Xrays showed no change in her hardware construct. She now returns because her pain is significant and her home medications are not providing her sufficient pain relief. She denies any new weakness, no radicular pain / numbness / parasthesias, normal bowel and bladder function.   ROS: A 14 point ROS was performed and is negative except as noted in the HPI.   PMHx:  Past Medical History:  Diagnosis Date  . Allergy    hay fever  . Anxiety   . Arthritis   . Chronic back pain   . Diabetes mellitus    no meds      pt states she is not diabetic  . Dyspnea   . GERD (gastroesophageal reflux disease)   . History of hiatal hernia   . Hyperlipidemia   . Hypertension   . Pneumonia   . Spinal stenosis   . UTI (lower urinary tract infection)    FamHx:  Family History  Problem Relation Age of Onset  . Hypertension Mother   . Stroke Mother   . Heart disease Mother   . Hypertension Father   . Stroke Father   . Heart disease Father   . Arthritis Sister   . Diabetes Sister   . Dementia Sister   . Cancer Sister   . Diabetes Brother   . Hypertension Brother   . Diabetes Brother   . Hypertension Brother   . Breast cancer Other 47  . Cancer Other        breast  . Diabetes Other   . Kidney disease Neg Hx   . Bladder Cancer Neg Hx    SocHx:  reports that she has never smoked. She has never used smokeless tobacco. She reports  that she drinks about 1.0 standard drinks of alcohol per week. She reports that she does not use drugs.  Exam: Vital signs in last 24 hours: Pulse Rate:  [62-77] 62 (11/15 1200) BP: (136-167)/(76-85) 136/76 (11/15 1200) SpO2:  [96 %-98 %] 97 % (11/15 1200) Weight:  [99 kg] 99 kg (11/15 1138) General: Awake, alert, cooperative, lying in bed in NAD, appears comfortable Head: normocephalic and atruamatic HEENT: neck supple Pulmonary: breathing room air comfortably, no evidence of increased work of breathing Cardiac: RRR Abdomen: S NT ND Extremities: warm and well perfused x4 Neuro: AOx3, PERRL, gaze neutral, FS Strength 5/5 x4, SILTx4 Posterior midline incision c/d/i, well approximated and healing well   Assessment and Plan: 72 y.o. woman s/p extension of thoracolumbar fusion construct with post-operative pain. MRI shows a post-operative seroma, non-compressive, no hematoma. Plain films show intact hardware. -no acute neurosurgical intervention indicated at this time -agree with admission to medicine for pain control -please call with any concerns or questions  Judith Part, MD 10/03/18 2:41 PM [facility]

## 2018-10-03 NOTE — ED Provider Notes (Signed)
[facility] EMERGENCY DEPARTMENT Provider Note   CSN: [id number] Arrival date & time: 10/03/18  1130     History   Chief Complaint Chief Complaint  Patient presents with  . Back Pain    HPI Erin Camacho is a 72 y.o. female history of diabetes, previous L1-2 fusion with recent T10-L2 decompression here presenting with worsening back pain.  Patient states that she had the above procedure done by Dr. Ellene Route on 10/28.  She states that she worsening pain in the hospital so she was kept several days longer and was discharged on 11/1.  Patient states that she was doing well until 3 days ago.  She had sudden onset of worsening back pain and had urinary and fecal incontinence at that time.  She did go to [facility] and had MRI that showed possible postop seroma vs abscess but had no fever so was thought to have postop seroma.  Patient was given a prescription of Dilaudid and dexamethasone.  Patient states that for the last several days, she was still unable to get out of bed and is in severe pain.  She denies any fevers or fecal or urinary incontinence since the first episode 3 days ago.  Patient denies any numbness or weakness.  She states that she just has severe pain when she walks.  The history is provided by the patient.    Past Medical History:  Diagnosis Date  . Allergy    hay fever  . Anxiety   . Arthritis   . Chronic back pain   . Diabetes mellitus    no meds      pt states she is not diabetic  . Dyspnea   . GERD (gastroesophageal reflux disease)   . History of hiatal hernia   . Hyperlipidemia   . Hypertension   . Pneumonia   . Spinal stenosis   . UTI (lower urinary tract infection)     Patient Active Problem List   Diagnosis Date Noted  . Spinal stenosis 09/15/2018  . Lumbar stenosis with neurogenic claudication 09/15/2018  . Shortness of breath 06/19/2018  . Cough 04/13/2018  . Aortic atherosclerosis ([place]) 01/11/2018  . GERD (gastroesophageal reflux  disease) 12/23/2016  . Atrophic vaginitis 03/18/2016  . Recurrent UTI 02/20/2016  . Spondylolisthesis at L5-S1 level 12/06/2015  . Preop examination 11/22/2015  . Rhinitis, allergic 11/22/2015  . Right leg pain 07/27/2014  . 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 Bilateral shoulder pain 02/24/2014  . Skin lesion 01/18/2014  . History of bariatric surgery 01/18/2014  . Back pain 05/25/2013  . Major depression 05/14/2013  . Major depressive disorder with single episode 05/14/2013  . Status post lumbar spine operation 04/17/2013  . Healthcare maintenance 01/07/2013  . Hypercholesterolemia 08/22/2012  . HLD (hyperlipidemia) 08/22/2012  . Hypertension 07/07/2012  . Diabetes mellitus type 2, diet-controlled ([place]) 07/07/2012  . Insomnia 07/07/2012  . Obesity (BMI 30-39.9) 07/07/2012  . Essential (primary) hypertension 07/07/2012    Past Surgical History:  Procedure Laterality Date  . ABDOMINAL HYSTERECTOMY  1973  . ABDOMINAL HYSTERECTOMY    . APPENDECTOMY    . APPLICATION OF ROBOTIC ASSISTANCE FOR SPINAL PROCEDURE N/A 09/15/2018   Procedure: APPLICATION OF ROBOTIC ASSISTANCE FOR SPINAL PROCEDURE;  Surgeon: Kristeen Miss, MD;  Location: [place];  Service: Neurosurgery;  Laterality: N/A;  . BACK SURGERY  04/17/13, 2017  . CHOLECYSTECTOMY  1995  . HERNIA REPAIR  08/2006  . lap band  08/2003  . SEPTOPLASTY  04/1978  . TONSILLECTOMY AND ADENOIDECTOMY  1979  . TOTAL HIP ARTHROPLASTY  02/2012   Right, Dr. Marry Guan     OB History   None      Home Medications    Prior to Admission medications   Medication Sig Start Date End Date Taking? Authorizing Provider  acetaminophen (TYLENOL) 500 MG tablet Take 1,000 mg by mouth every 6 (six) hours as needed for moderate pain.   Yes [provider]  ALPRAZolam (XANAX) 0.25 MG tablet Take 1 tablet (0.25 mg total) by mouth daily as needed for anxiety. 08/22/18  Yes [name], MD    Cholecalciferol (VITAMIN D3) 2000 units TABS Take 2,000 Units by mouth daily.   Yes [provider]  dexamethasone (DECADRON) 2 MG tablet Take 1 tablet (2 mg total) by mouth daily for 10 days. 09/30/18 10/10/18 Yes Cuthriell, Charline Bills, PA-C  fexofenadine (ALLEGRA) 180 MG tablet Take 1 tablet (180 mg total) by mouth daily. 01/18/14  Yes Jackolyn Confer, MD  fluticasone (FLONASE) 50 MCG/ACT nasal spray INSTILL 2 SPRAYS INTO EACH NOSTRIL EVERY DAY Patient taking differently: Place 2 sprays into both nostrils daily.  09/22/18  Yes [name], MD  HYDROmorphone (DILAUDID) 2 MG tablet Take 1 tablet (2 mg total) by mouth every 6 (six) hours as needed for severe pain. 09/30/18 09/30/19 Yes Cuthriell, Charline Bills, PA-C  Lidocaine HCl (ASPERCREME LIDOCAINE) 4 % LIQD Apply 1 application topically as needed (pain).   Yes [provider]  Liniments (SALONPAS EX) Apply 1 application topically daily.   Yes [provider]  losartan-hydrochlorothiazide (HYZAAR) 50-12.5 MG tablet Take 1 tablet by mouth daily. 04/10/18  Yes [name], MD  metFORMIN (GLUCOPHAGE) 500 MG tablet Take 1 tablet (500 mg total) by mouth daily. 08/22/18  Yes [name], MD  metoprolol succinate (TOPROL-XL) 25 MG 24 hr tablet TAKE ONE 1/2 tablet per day. Patient taking differently: Take 12.5 mg by mouth daily.  10/17/17  Yes [name], MD  Multiple Vitamin (MULTIVITAMIN) tablet Take 1 tablet by mouth daily.   Yes [provider]  pantoprazole (PROTONIX) 40 MG tablet TAKE 1 TABLET (40 MG TOTAL) BY MOUTH 2 (TWO) TIMES DAILY BEFORE A MEAL. 10/03/18  Yes [name], MD  sertraline (ZOLOFT) 100 MG tablet TAKE 1/2 TABLET BY MOUTH (50 MG) BY MOUTH DAILY Patient taking differently: Take 50 mg by mouth daily.  08/05/18  Yes [name], MD  simvastatin (ZOCOR) 10 MG tablet TAKE ONE-HALF TABLET (5 MG TOTAL) BY MOUTH AT BEDTIME. Patient taking differently: Take 5 mg by mouth at bedtime.   05/06/18  Yes [name], MD  traZODone (DESYREL) 100 MG tablet TAKE 1 TABLET BY MOUTH EVERYDAY AT BEDTIME Patient taking differently: Take 100 mg by mouth at bedtime. TAKE 1 TABLET BY MOUTH EVERYDAY AT BEDTIME 09/25/18  Yes Scott, Randell Patient, MD  fluticasone (FLONASE) 50 MCG/ACT nasal spray INSTILL 2 SPRAYS INTO EACH NOSTRIL EVERY DAY Patient not taking: Reported on 10/03/2018 09/24/18   [name], MD  methocarbamol (ROBAXIN) 500 MG tablet Take 1 tablet (500 mg total) by mouth every 6 (six) hours as needed for muscle spasms. Patient not taking: Reported on 10/03/2018 09/19/18   Kristeen Miss, MD  oxyCODONE-acetaminophen (PERCOCET/ROXICET) 5-325 MG tablet Take 1-2 tablets by mouth every 3 (three) hours as needed for severe pain. Patient not taking: Reported on 10/03/2018 09/19/18   Kristeen Miss, MD    Family History Family History  Problem Relation Age of  Onset  . Hypertension Mother   . Stroke Mother   . Heart disease Mother   . Hypertension Father   . Stroke Father   . Heart disease Father   . Arthritis Sister   . Diabetes Sister   . Dementia Sister   . Cancer Sister   . Diabetes Brother   . Hypertension Brother   . Diabetes Brother   . Hypertension Brother   . Breast cancer Other 47  . Cancer Other        breast  . Diabetes Other   . Kidney disease Neg Hx   . Bladder Cancer Neg Hx     Social History Social History   Tobacco Use  . Smoking status: Never Smoker  . Smokeless tobacco: Never Used  Substance Use Topics  . Alcohol use: Yes    Alcohol/week: 1.0 standard drinks    Types: 1 Glasses of wine per week    Comment: occ  . Drug use: No     Allergies   Adhesive [tape]   Review of Systems Review of Systems  Musculoskeletal: Positive for back pain.  All other systems reviewed and are negative.    Physical Exam Updated Vital Signs BP 136/76   Pulse 62   Ht 5\' 5"  (1.651 m)   Wt 99 kg   SpO2 97%   BMI 36.32 kg/m   Physical Exam    Constitutional: She is oriented to person, place, and time.  Uncomfortable   HENT:  Head: Normocephalic.  Mouth/Throat: Oropharynx is clear and moist.  Eyes: Pupils are equal, round, and reactive to light. Conjunctivae and EOM are normal.  Neck: Normal range of motion. Neck supple.  Cardiovascular: Normal rate, regular rhythm and normal heart sounds.  Pulmonary/Chest: Effort normal and breath sounds normal. No stridor. No respiratory distress.  Abdominal: Soft. Bowel sounds are normal. She exhibits no distension. There is no tenderness.  Musculoskeletal:  Previous laminectomy scar healing well with minimal redness, no obvious purulent drainage   Neurological: She is alert and oriented to person, place, and time.  No saddle anesthesia. + straight leg raise bilaterally. Nl reflexes bilaterally, nl strength bilateral legs   Skin: Skin is warm.  Psychiatric: She has a normal mood and affect.  Nursing note and vitals reviewed.    ED Treatments / Results  Labs (all labs ordered are listed, but only abnormal results are displayed) Labs Reviewed  CBC WITH DIFFERENTIAL/PLATELET - Abnormal; Notable for the following components:      Result Value   Hemoglobin 11.2 (*)    RDW 15.7 (*)    Platelets 452 (*)    All other components within normal limits  COMPREHENSIVE METABOLIC PANEL - Abnormal; Notable for the following components:   Potassium 3.2 (*)    Glucose, Bld 173 (*)    Creatinine, Ser 1.01 (*)    GFR calc non Af Amer 54 (*)    All other components within normal limits    EKG None  Radiology No results found.  Procedures Procedures (including critical care time)  Medications Ordered in ED Medications  HYDROmorphone (DILAUDID) injection 1 mg (1 mg Intravenous Given 10/03/18 1146)  potassium chloride SA (K-DUR,KLOR-CON) CR tablet 40 mEq (40 mEq Oral Given 10/03/18 1339)  HYDROmorphone (DILAUDID) injection 1 mg (1 mg Intravenous Given 10/03/18 1339)     Initial  Impression / Assessment and Plan / ED Course  I have reviewed the triage vital signs and the nursing notes.  Pertinent labs & imaging  results that were available during my care of the patient were reviewed by me and considered in my medical decision making (see chart for details).    BREZLYN MANRIQUE is a 72 y.o. female here with back pain. Had T10-L2 decompression on 10/28 so she is 2 weeks postop here with worsening back pain. MRI 2 days ago showed postop seroma but otherwise no cord compression. She is already on dilaudid and decadron at home and has no recent fall or incontinence. Will get basic labs and consult neurosurgery. Will give pain meds.  1:30 pm Labs unremarkable. I talked to Dr. Zada Finders from neurosurgery. He reviewed MRI and states that patient doesn't need neurosurgery intervention but he will see patient. He recommend pain control. I talked to hospitalist to admit for pain control.      Final Clinical Impressions(s) / ED Diagnoses   Final diagnoses:  None    ED Discharge Orders    None       Drenda Freeze, MD 10/03/18 1357

## 2018-10-03 NOTE — H&P (Addendum)
History and Physical    DOA: 10/03/2018  PCP: [name], MD  Patient coming from: Home  Chief Complaint: Worsening back pain  HPI: Erin Camacho is a 72 y.o. female with history h/o hypertension, hyperlipidemia, GERD, anxiety, chronic back pain, spinal stenosis who recently underwent lumbar decompression and extension of her prior fusion construct on October 28 by [place] neurosurgery Dr.Elsner presents with refractory back pain.  She describes pain as spasmodic paraspinal and bilateral to the surgical site 8-10 over 10 and nonradiating.  Prior to discharge from this [facility] after spinal surgery patient apparently was able to ambulate with PT about 100 feet although she had intermittent muscle spasms.  She was prescribed oxycodone/hydrocodone as well as Robaxin for pain/muscle spasms.  She states she had worsening pain after about 3 days staying at home and she ended up at [facility] ED where she was treated with IV Dilaudid and discharged home with p.o. Dilaudid/Decadron 2 mg daily prescription.  Patient states Dilaudid was helping with pain for about 3 hours but her pain would recur at about 8/10.  She tried calling neurosurgery office and was told Dr. Ellene Route is out of town but could not reach covering MD upon multiple further attempts.  She finally presented to the ED today as she has not been able to get any relief and feels the muscle tightness and spasms are getting worse around her surgical site.  She received multiple rounds of IV Dilaudid in the ED with some relief but does not feel comfortable going home as still has significant pain at 7/10.  Patient seen by neurosurgery in the ED and recommended admission for further evaluation and management.   Review of Systems: As per HPI otherwise 10 point review of systems negative.    Past Medical History:  Diagnosis Date  . Allergy    hay fever  . Anxiety   . Arthritis   . Chronic back pain   . Diabetes  mellitus    no meds      pt states she is not diabetic  . Dyspnea   . GERD (gastroesophageal reflux disease)   . History of hiatal hernia   . Hyperlipidemia   . Hypertension   . Pneumonia   . Spinal stenosis   . UTI (lower urinary tract infection)     Past Surgical History:  Procedure Laterality Date  . ABDOMINAL HYSTERECTOMY  1973  . ABDOMINAL HYSTERECTOMY    . APPENDECTOMY    . APPLICATION OF ROBOTIC ASSISTANCE FOR SPINAL PROCEDURE N/A 09/15/2018   Procedure: APPLICATION OF ROBOTIC ASSISTANCE FOR SPINAL PROCEDURE;  Surgeon: Kristeen Miss, MD;  Location: [place];  Service: Neurosurgery;  Laterality: N/A;  . BACK SURGERY  04/17/13, 2017  . CHOLECYSTECTOMY  1995  . HERNIA REPAIR  08/2006  . lap band  08/2003  . SEPTOPLASTY  04/1978  . TONSILLECTOMY AND ADENOIDECTOMY  1979  . TOTAL HIP ARTHROPLASTY  02/2012   Right, Dr. Marry Guan    Social history:  reports that she has never smoked. She has never used smokeless tobacco. She reports that she drinks about 1.0 standard drinks of alcohol per week. She reports that she does not use drugs.   Allergies  Allergen Reactions  . Adhesive [Tape] Itching    Honeycomb bandage     Family History  Problem Relation Age of Onset  . Hypertension Mother   . Stroke Mother   . Heart disease Mother   . Hypertension Father   .  Stroke Father   . Heart disease Father   . Arthritis Sister   . Diabetes Sister   . Dementia Sister   . Cancer Sister   . Diabetes Brother   . Hypertension Brother   . Diabetes Brother   . Hypertension Brother   . Breast cancer Other 47  . Cancer Other        breast  . Diabetes Other   . Kidney disease Neg Hx   . Bladder Cancer Neg Hx       Prior to Admission medications   Medication Sig Start Date End Date Taking? Authorizing Provider  acetaminophen (TYLENOL) 500 MG tablet Take 1,000 mg by mouth every 6 (six) hours as needed for moderate pain.   Yes [provider]  ALPRAZolam (XANAX) 0.25 MG  tablet Take 1 tablet (0.25 mg total) by mouth daily as needed for anxiety. 08/22/18  Yes [name], MD  Cholecalciferol (VITAMIN D3) 2000 units TABS Take 2,000 Units by mouth daily.   Yes [provider]  dexamethasone (DECADRON) 2 MG tablet Take 1 tablet (2 mg total) by mouth daily for 10 days. 09/30/18 10/10/18 Yes Cuthriell, Charline Bills, PA-C  fexofenadine (ALLEGRA) 180 MG tablet Take 1 tablet (180 mg total) by mouth daily. 01/18/14  Yes Jackolyn Confer, MD  fluticasone (FLONASE) 50 MCG/ACT nasal spray INSTILL 2 SPRAYS INTO EACH NOSTRIL EVERY DAY Patient taking differently: Place 2 sprays into both nostrils daily.  09/22/18  Yes [name], MD  HYDROmorphone (DILAUDID) 2 MG tablet Take 1 tablet (2 mg total) by mouth every 6 (six) hours as needed for severe pain. 09/30/18 09/30/19 Yes Cuthriell, Charline Bills, PA-C  Lidocaine HCl (ASPERCREME LIDOCAINE) 4 % LIQD Apply 1 application topically as needed (pain).   Yes [provider]  Liniments (SALONPAS EX) Apply 1 application topically daily.   Yes [provider]  losartan-hydrochlorothiazide (HYZAAR) 50-12.5 MG tablet Take 1 tablet by mouth daily. 04/10/18  Yes [name], MD  metFORMIN (GLUCOPHAGE) 500 MG tablet Take 1 tablet (500 mg total) by mouth daily. 08/22/18  Yes [name], MD  metoprolol succinate (TOPROL-XL) 25 MG 24 hr tablet TAKE ONE 1/2 tablet per day. Patient taking differently: Take 12.5 mg by mouth daily.  10/17/17  Yes [name], MD  Multiple Vitamin (MULTIVITAMIN) tablet Take 1 tablet by mouth daily.   Yes [provider]  pantoprazole (PROTONIX) 40 MG tablet TAKE 1 TABLET (40 MG TOTAL) BY MOUTH 2 (TWO) TIMES DAILY BEFORE A MEAL. 10/03/18  Yes [name], MD  sertraline (ZOLOFT) 100 MG tablet TAKE 1/2 TABLET BY MOUTH (50 MG) BY MOUTH DAILY Patient taking differently: Take 50 mg by mouth daily.  08/05/18  Yes [name], MD  simvastatin (ZOCOR) 10 MG tablet TAKE  ONE-HALF TABLET (5 MG TOTAL) BY MOUTH AT BEDTIME. Patient taking differently: Take 5 mg by mouth at bedtime.  05/06/18  Yes [name], MD  traZODone (DESYREL) 100 MG tablet TAKE 1 TABLET BY MOUTH EVERYDAY AT BEDTIME Patient taking differently: Take 100 mg by mouth at bedtime. TAKE 1 TABLET BY MOUTH EVERYDAY AT BEDTIME 09/25/18  Yes Scott, Randell Patient, MD  fluticasone (FLONASE) 50 MCG/ACT nasal spray INSTILL 2 SPRAYS INTO EACH NOSTRIL EVERY DAY Patient not taking: Reported on 10/03/2018 09/24/18   [name], MD  methocarbamol (ROBAXIN) 500 MG tablet Take 1 tablet (500 mg total) by mouth every 6 (six) hours as needed for muscle spasms. Patient not taking: Reported on 10/03/2018 09/19/18   Elsner,  Mallie Mussel, MD  oxyCODONE-acetaminophen (PERCOCET/ROXICET) 5-325 MG tablet Take 1-2 tablets by mouth every 3 (three) hours as needed for severe pain. Patient not taking: Reported on 10/03/2018 09/19/18   Kristeen Miss, MD    Physical Exam: Vitals:   10/03/18 1140 10/03/18 1145 10/03/18 1200 10/03/18 1537  BP:  (!) 147/84 136/76 (!) 161/71  Pulse: 74 67 62 70  Resp:    16  Temp:    97.8 F (36.6 C)  TempSrc:    Oral  SpO2: 97% 98% 97% 100%  Weight:      Height:        Constitutional: NAD, calm, comfortable Vitals:   10/03/18 1140 10/03/18 1145 10/03/18 1200 10/03/18 1537  BP:  (!) 147/84 136/76 (!) 161/71  Pulse: 74 67 62 70  Resp:    16  Temp:    97.8 F (36.6 C)  TempSrc:    Oral  SpO2: 97% 98% 97% 100%  Weight:      Height:       Eyes: PERRL, lids and conjunctivae normal ENMT: Mucous membranes are moist. Posterior pharynx clear of any exudate or lesions.Normal dentition.  Neck: normal, supple, no masses, no thyromegaly Respiratory: clear to auscultation bilaterally, no wheezing, no crackles. Normal respiratory effort. No accessory muscle use.  Cardiovascular: Regular rate and rhythm, no murmurs / rubs / gallops. No extremity edema. 2+ pedal pulses. No carotid bruits.  Abdomen: no  tenderness, no masses palpated. No hepatosplenomegaly. Bowel sounds positive.  Musculoskeletal: no clubbing / cyanosis. No joint deformity upper and lower extremities. Good ROM, no contractures. Normal muscle tone.  Neurologic: CN 2-12 grossly intact. Sensation intact, DTR normal. Strength 5/5 in all 4.  Psychiatric: Normal judgment and insight. Alert and oriented x 3. Normal mood.  SKIN/catheters: Well-healing surgical scar with no signs of infection noted.  No local swellings or tenderness.  Labs on Admission: I have personally reviewed following labs and imaging studies  CBC: Recent Labs  Lab 09/30/18 1739 10/03/18 1138  WBC 6.9 5.1  NEUTROABS 4.1 3.7  HGB 10.8* 11.2*  HCT 33.1* 36.5  MCV 93.0 93.8  PLT 460* 384*   Basic Metabolic Panel: Recent Labs  Lab 09/30/18 1739 10/03/18 1138  NA 141 139  K 3.4* 3.2*  CL 104 103  CO2 24 25  GLUCOSE 130* 173*  BUN 17 16  CREATININE 0.85 1.01*  CALCIUM 10.0 10.1   GFR: Estimated Creatinine Clearance: 57.8 mL/min (A) (by C-G formula based on SCr of 1.01 mg/dL (H)). Liver Function Tests: Recent Labs  Lab 09/30/18 1739 10/03/18 1138  AST 28 25  ALT 20 19  ALKPHOS 79 76  BILITOT 0.9 0.8  PROT 7.4 7.0  ALBUMIN 4.2 4.1   No results for input(s): LIPASE, AMYLASE in the last 168 hours. No results for input(s): AMMONIA in the last 168 hours. Coagulation Profile: No results for input(s): INR, PROTIME in the last 168 hours. Cardiac Enzymes: No results for input(s): CKTOTAL, CKMB, CKMBINDEX, TROPONINI in the last 168 hours. BNP (last 3 results) No results for input(s): PROBNP in the last 8760 hours. HbA1C: No results for input(s): HGBA1C in the last 72 hours. CBG: No results for input(s): GLUCAP in the last 168 hours. Lipid Profile: No results for input(s): CHOL, HDL, LDLCALC, TRIG, CHOLHDL, LDLDIRECT in the last 72 hours. Thyroid Function Tests: No results for input(s): TSH, T4TOTAL, FREET4, T3FREE, THYROIDAB in the last 72  hours. Anemia Panel: No results for input(s): VITAMINB12, FOLATE, FERRITIN, TIBC, IRON, RETICCTPCT in  the last 72 hours. Urine analysis:    Component Value Date/Time   COLORURINE YELLOW (A) 09/30/2018 1739   APPEARANCEUR CLEAR (A) 09/30/2018 1739   APPEARANCEUR Clear 05/03/2016 0958   LABSPEC 1.012 09/30/2018 1739   LABSPEC 1.012 03/21/2012 1615   PHURINE 7.0 09/30/2018 1739   GLUCOSEU NEGATIVE 09/30/2018 1739   GLUCOSEU NEGATIVE 05/31/2017 1456   HGBUR NEGATIVE 09/30/2018 1739   BILIRUBINUR NEGATIVE 09/30/2018 1739   BILIRUBINUR Negative 05/03/2016 0958   BILIRUBINUR Negative 03/21/2012 1615   KETONESUR 5 (A) 09/30/2018 1739   PROTEINUR NEGATIVE 09/30/2018 1739   UROBILINOGEN 0.2 05/31/2017 1456   NITRITE NEGATIVE 09/30/2018 1739   LEUKOCYTESUR NEGATIVE 09/30/2018 1739   LEUKOCYTESUR 1+ (A) 05/03/2016 0958   LEUKOCYTESUR 2+ 03/21/2012 1615    Radiological Exams on Admission: No results found.     Assessment and Plan:   1.  Refractory postoperative back pain: Patient underwent MRI at [facility] recently which showed postsurgical paraspinal seroma.  Discussed with neurosurgery who do not feel that would contribute to symptoms and recommended admission with IV steroids/IV pain medications.  Will add Neurontin, lidocaine patch and Zanaflex as needed for muscle spasms.  Watch for sedation.  Advised patient to utilize oral pain medications for moderate pain and IV Dilaudid only for breakthrough to ensure adequate transition to oral regimen prior to discharge.  Fall precautions while on multiple sedatives.  Watch pulse ox.  2.  Hypertension/hyperlipidemia: Resume home medications  3.  GERD: Resume home medications  4.  Anxiety disorder: Resume home medications, watch for sedation on pain medications  5.  Borderline diabetes: On metformin  DVT prophylaxis: Lovenox  Code Status: Full code  Family Communication: Discussed with patient and husband bedside. Health care  proxy would be husband Consults called: Neurosurgery Admission status:  Patient admitted as observation as anticipated LOS less than 2 midnights    Guilford Shi MD [facility] Pager [phone]  If 7PM-7AM, please contact night-coverage [email or web address] Password [email or web address]  10/03/2018, 4:59 PM

## 2018-10-03 NOTE — ED Triage Notes (Signed)
Pt to ED with c/o back spasms that are worsening since recent back surgery on October 28th.

## 2018-10-04 DIAGNOSIS — E119 Type 2 diabetes mellitus without complications: Secondary | ICD-10-CM | POA: Diagnosis not present

## 2018-10-04 DIAGNOSIS — M545 Low back pain: Secondary | ICD-10-CM | POA: Diagnosis not present

## 2018-10-04 DIAGNOSIS — I10 Essential (primary) hypertension: Secondary | ICD-10-CM | POA: Diagnosis not present

## 2018-10-04 DIAGNOSIS — K219 Gastro-esophageal reflux disease without esophagitis: Secondary | ICD-10-CM | POA: Diagnosis not present

## 2018-10-04 MED ORDER — POLYETHYLENE GLYCOL 3350 17 G PO PACK: 17.0000 g | PACK | Freq: Every day | ORAL | 1 refills | Status: DC | PRN

## 2018-10-04 MED ORDER — POLYETHYLENE GLYCOL 3350 17 G PO PACK: 17.0000 g | PACK | Freq: Every day | ORAL | Status: DC

## 2018-10-04 MED ORDER — ALPRAZOLAM 0.25 MG PO TABS: 0.2500 mg | ORAL_TABLET | Freq: Every day | ORAL | 0 refills | Status: DC | PRN

## 2018-10-04 MED ORDER — SENNOSIDES-DOCUSATE SODIUM 8.6-50 MG PO TABS: 2.0000 | ORAL_TABLET | Freq: Every day | ORAL | 1 refills | Status: DC | PRN

## 2018-10-04 MED ORDER — OXYCODONE-ACETAMINOPHEN 5-325 MG PO TABS: 1.0000 | ORAL_TABLET | ORAL | 0 refills | Status: AC | PRN

## 2018-10-04 MED ORDER — GABAPENTIN 100 MG PO CAPS: 100.0000 mg | ORAL_CAPSULE | Freq: Three times a day (TID) | ORAL | 0 refills | Status: DC

## 2018-10-04 MED ORDER — TIZANIDINE HCL 2 MG PO TABS: 2.0000 mg | ORAL_TABLET | Freq: Three times a day (TID) | ORAL | 0 refills | Status: DC | PRN

## 2018-10-04 MED ORDER — DEXAMETHASONE 2 MG PO TABS: ORAL_TABLET | ORAL | 0 refills | Status: AC

## 2018-10-04 NOTE — Discharge Summary (Signed)
Physician Discharge Summary  Erin Camacho:[id number] DOB: April 24, 1946 DOA: 10/03/2018  PCP: [name], MD  Admit date: 10/03/2018 Discharge date: 10/04/2018  Admitted From: Home Disposition: Home  Recommendations for Outpatient Follow-up:  1. Follow up with PCP in 1-2 weeks 2. Please obtain BMP/CBC in one week your next doctors visit.  3. Follow-up outpatient with neurosurgery on October 14, 2018. 4. Gabapentin 100 mg 3 times daily, Zanaflex 2 mg every 8 hours as needed has been added.  Bowel regimen has been given.  New prescription for oxycodone has been given. 5. Increase dexamethasone dose to 2 mg twice daily for 3 days followed by 2 mg daily for 7 days.  Home Health: None Equipment/Devices: None Discharge Condition: Stable CODE STATUS: Full code Diet recommendation: Full code  Brief/Interim Summary: 72 year old with history of essential hypertension, hyperlipidemia, GERD, anxiety, chronic back pain, spinal stenosis who underwent lumbar decompression and extension of her prior fusion construct on September 15, 2018.  Since then she has had worsening of the lower back pain and has visited ER couple of times.  She could not tolerate the pain yesterday therefore returned back to the ER.  Her original surgery was performed by Dr. Ellene Route.  MRI of the spine performed at [facility] showed paraspinal seroma.  Neurosurgery was reconsulted who recommended pain management otherwise no further intervention. During the hospitalization patient was started on gabapentin and Zanaflex as well.  She did well over the course of 24 hours and felt much better the following day.  Today her pain is well controlled and would like to go home.  She is able to ambulate in the hallway without any issues.   Discharge Diagnoses:  Active Problems:   Hypertension   Diabetes mellitus type 2, diet-controlled (HCC)   Obesity (BMI 30-39.9)   Hypercholesterolemia   Back pain   Essential (primary)  hypertension   GERD (gastroesophageal reflux disease)  Lower back pain, acute.  Improved Paraspinal seroma Recent lumbar decompression with extension of prior fusion construct, September 07, 2018 -Patient seen by neurosurgery, no acute intervention.  Continue pain management. -We will resume her home pain medications.  I have given refill prescription for oxycodone.  Added gabapentin 100 mg 3 times daily and Zanaflex. -Increased her Decadron to 2 mg twice daily for 3 days followed by 2 mg daily.  Follow-up with outpatient neurosurgery in October 14, 2018  Essential hypertension -Resume home meds  Hyperlipidemia -Resume home meds  GERD -PPI  History of anxiety  -Resume home meds  Diabetes mellitus type 2 -Resume metformin  Patient was on Lovenox for DVT prophylaxis while here Full code Husband is at the bedside Stable for discharge.  Discharge Instructions   Allergies as of 10/04/2018      Reactions   Adhesive [tape] Itching   Honeycomb bandage       Medication List    STOP taking these medications   methocarbamol 500 MG tablet Commonly known as:  ROBAXIN     TAKE these medications   acetaminophen 500 MG tablet Commonly known as:  TYLENOL Take 1,000 mg by mouth every 6 (six) hours as needed for moderate pain.   ALPRAZolam 0.25 MG tablet Commonly known as:  XANAX Take 1 tablet (0.25 mg total) by mouth daily as needed for anxiety.   ASPERCREME LIDOCAINE 4 % Liqd Generic drug:  Lidocaine HCl Apply 1 application topically as needed (pain).   dexamethasone 2 MG tablet Commonly known as:  DECADRON Take 1 tablet (2 mg  total) by mouth 2 (two) times daily for 3 days, THEN 1 tablet (2 mg total) daily for 7 days. Start taking on:  10/04/2018 What changed:  See the new instructions.   fexofenadine 180 MG tablet Commonly known as:  ALLEGRA Take 1 tablet (180 mg total) by mouth daily.   fluticasone 50 MCG/ACT nasal spray Commonly known as:  FLONASE INSTILL 2  SPRAYS INTO EACH NOSTRIL EVERY DAY What changed:  See the new instructions.   fluticasone 50 MCG/ACT nasal spray Commonly known as:  FLONASE INSTILL 2 SPRAYS INTO EACH NOSTRIL EVERY DAY What changed:  Another medication with the same name was changed. Make sure you understand how and when to take each.   gabapentin 100 MG capsule Commonly known as:  NEURONTIN Take 1 capsule (100 mg total) by mouth 3 (three) times daily.   HYDROmorphone 2 MG tablet Commonly known as:  DILAUDID Take 1 tablet (2 mg total) by mouth every 6 (six) hours as needed for severe pain.   losartan-hydrochlorothiazide 50-12.5 MG tablet Commonly known as:  HYZAAR Take 1 tablet by mouth daily.   metFORMIN 500 MG tablet Commonly known as:  GLUCOPHAGE Take 1 tablet (500 mg total) by mouth daily.   metoprolol succinate 25 MG 24 hr tablet Commonly known as:  TOPROL-XL TAKE ONE 1/2 tablet per day. What changed:    how much to take  how to take this  when to take this  additional instructions   multivitamin tablet Take 1 tablet by mouth daily.   oxyCODONE-acetaminophen 5-325 MG tablet Commonly known as:  PERCOCET/ROXICET Take 1-2 tablets by mouth every 4 (four) hours as needed for up to 5 days for severe pain. What changed:  when to take this   pantoprazole 40 MG tablet Commonly known as:  PROTONIX TAKE 1 TABLET (40 MG TOTAL) BY MOUTH 2 (TWO) TIMES DAILY BEFORE A MEAL.   polyethylene glycol packet Commonly known as:  MIRALAX / GLYCOLAX Take 17 g by mouth daily as needed for severe constipation.   SALONPAS EX Apply 1 application topically daily.   senna-docusate 8.6-50 MG tablet Commonly known as:  Senokot-S Take 2 tablets by mouth daily as needed for mild constipation or moderate constipation.   sertraline 100 MG tablet Commonly known as:  ZOLOFT TAKE 1/2 TABLET BY MOUTH (50 MG) BY MOUTH DAILY What changed:  See the new instructions.   simvastatin 10 MG tablet Commonly known as:   ZOCOR TAKE ONE-HALF TABLET (5 MG TOTAL) BY MOUTH AT BEDTIME. What changed:  See the new instructions.   tiZANidine 2 MG tablet Commonly known as:  ZANAFLEX Take 1 tablet (2 mg total) by mouth every 8 (eight) hours as needed for muscle spasms.   traZODone 100 MG tablet Commonly known as:  DESYREL TAKE 1 TABLET BY MOUTH EVERYDAY AT BEDTIME What changed:  See the new instructions.   Vitamin D3 50 MCG (2000 UT) Tabs Take 2,000 Units by mouth daily.      Follow-up Information    [name], MD. Schedule an appointment as soon as possible for a visit in 1 week(s).   Specialty:  Internal Medicine Contact information: [address] [place] [address] [phone]        Kristeen Miss, MD. Schedule an appointment as soon as possible for a visit in 10 day(s).   Specialty:  Neurosurgery Contact information: [address] [place] [address] [place] [address] [phone]          Allergies  Allergen Reactions  . Adhesive [Tape] Itching    Honeycomb bandage     You were cared for by a hospitalist during your hospital stay. If you have any questions about your discharge medications or the care you received while you were in the hospital after you are discharged, you can call the unit and asked to speak with the hospitalist on call if the hospitalist that took care of you is not available. Once you are discharged, your primary care physician will handle any further medical issues. Please note that no refills for any discharge medications will be authorized once you are discharged, as it is imperative that you return to your primary care physician (or establish a relationship with a primary care physician if you do not have one) for your aftercare needs so that they can reassess your need for medications and monitor your lab values.  Consultations:  Neurosurgery   Procedures/Studies: Dg Thoracolumabar Spine  Result Date: 09/18/2018 CLINICAL  DATA:  Postoperative pain. EXAM: THORACOLUMBAR SPINE 1V COMPARISON:  09/15/2018 CT 08/06/2018. FINDINGS: Multilevel thoracolumbar spine fusion. Hardware intact. Stable anterolisthesis L5 on S1. No acute bony abnormality. No evidence of fracture. Surgical clips right upper quadrant. IMPRESSION: Multilevel thoracolumbar spine fusion. Hardware intact. Stable anterolisthesis L5 on S1. No acute bony abnormality. Electronically Signed   By: Marcello Moores  Register   On: 09/18/2018 13:33   Dg Lumbar Spine 2-3 Views  Result Date: 09/15/2018 CLINICAL DATA:  T10-L2 fusion EXAM: LUMBAR SPINE - 2-3 VIEW; DG C-ARM 61-120 MIN COMPARISON:  08/06/2018 CT FINDINGS: Intraoperative spot films of the thoracolumbar spine are submitted postoperatively for interpretation. Posterior rod and bipedicular screw fixation noted from T10-L1. The new posterior rods are clamped to the existing lumbar spine rods. Interbody fusion spacers are identified at L1-2. IMPRESSION: Surgical fusion changes from T10-L2 as described. Electronically Signed   By: Margarette Canada M.D.   On: 09/15/2018 19:33   Dg Lumbar Spine Complete  Result Date: 09/30/2018 CLINICAL DATA:  Severe back pain. History of lumbar fusion on 09/15/2018. EXAM: LUMBAR SPINE - COMPLETE 4+ VIEW COMPARISON:  Intraoperative lumbar spine radiographs-09/15/2018; lumbar spine CT-08/06/2018 FINDINGS: Stable sequela of cranial extension of previously noted paraspinal lumbar fusion hardware. Paraspinal fusion hardware is now extending from T10 through S1 with bilateral pedicular screws seen at all levels. Post L1-L2 intervertebral disc space replacement with restoration of the L1-L2 intervertebral disc space height. Stable sequela of L2-L3, L3-L4, L4-L5 and L5-S1 intervertebral disc space replacement. Cement material is noted about the T10, T11, T12 and L1 pedicular screws. A small amount of cement is seen within one of the T11 paraspinal lumbar veins. Limited visualization the bilateral SI joints  is normal. Scattered calcifications within the abdominal aorta. Post cholecystectomy. Sequela of prior gastric lap band procedure. IMPRESSION: Post cranial extension of now long segment T10-L1 paraspinal fusion and multilevel cement augmentation and intervertebral disc space replacement without definite evidence of hardware failure or loosening. If concern persists, further evaluation with MRI could be performed as indicated. Electronically Signed   By: Sandi Mariscal M.D.   On: 09/30/2018 18:36   Mr Lumbar Spine Wo Contrast  Result Date: 09/30/2018 CLINICAL DATA:  Acute onset low back pain. Status post lumbar spine surgery September 15, 2018. Bowel and bladder incontinence, suspect cauda equina syndrome. EXAM: MRI LUMBAR SPINE WITHOUT CONTRAST TECHNIQUE: Multiplanar, multisequence MR imaging of the lumbar spine was performed. No intravenous contrast was administered. COMPARISON:  Lumbar spine radiographs July 31, 2018 and MRI lumbar spine July 20, 2018 FINDINGS: SEGMENTATION: For the purposes of this report, the last well-formed intervertebral disc is reported as L5-S1. ALIGNMENT: Maintained lumbar lordosis. Stable grade 1 L5-S1 anterolisthesis. VERTEBRAE:Vertebral bodies are intact. New L1-2 disc prosthesis with cephalad extension of spinal fusion, status post thoracolumbar PLIF, revised at L1. Pedicle screws result in susceptibility artifact. Bridging bone marrow signal L2-3 through L5-S1 compatible with arthrodesis. Moderate T11-12 disc height loss similar to prior examination with mild desiccation of the nonsurgically altered thoracic discs. No suspicious or acute bone marrow signal. Central low signal within T11 through L1 consistent with vertebral body cement augmentation. CONUS MEDULLARIS AND CAUDA EQUINA: Conus medullaris terminates at L1-2, no abnormal signal though hardware artifact limits assessment. No nerve root clumping. PARASPINAL AND OTHER SOFT TISSUES: Severe paraspinal muscle  postoperative denervation. Ill-defined low T1, bright T2 2.9 x 4 cm fluid collection extending 12.4 cm from T11-L3 within the subcutaneous fat along surgical approach. DISC LEVELS: Axial images not obtained through thoracic spine. No canal stenosis. No high-grade neural foraminal narrowing the limited by hardware artifact. L1-2: Disc prosthesis. Posterior decompression. No canal stenosis or neural foraminal narrowing, improved from prior examination. L2-3, L3-4, L4-5: PLIF. Posterior decompression without canal stenosis or neural foraminal narrowing. L5-S1: Anterolisthesis. PLIF. No canal stenosis. Similar mild RIGHT neural foraminal narrowing. IMPRESSION: 1. Thoracolumbar spinal fusion, revised at L1 with new thoracic component, status post included thoracic and L1 cement augmentation. Postoperative paraspinal seroma, unlikely to reflect abscess or pseudomeningocele. 2. No acute fracture.  Chronic grade 1 L5-S1 anterolisthesis. 3. No canal stenosis. Mild similar RIGHT L5-S1 neural foraminal narrowing. Electronically Signed   By: Elon Alas M.D.   On: 09/30/2018 21:57   Dg C-arm 1-60 Min  Result Date: 09/15/2018 CLINICAL DATA:  T10-L2 fusion EXAM: LUMBAR SPINE - 2-3 VIEW; DG C-ARM 61-120 MIN COMPARISON:  08/06/2018 CT FINDINGS: Intraoperative spot films of the thoracolumbar spine are submitted postoperatively for interpretation. Posterior rod and bipedicular screw fixation noted from T10-L1. The new posterior rods are clamped to the existing lumbar spine rods. Interbody fusion spacers are identified at L1-2. IMPRESSION: Surgical fusion changes from T10-L2 as described. Electronically Signed   By: Margarette Canada M.D.   On: 09/15/2018 19:33   Dg C-arm 1-60 Min  Result Date: 09/15/2018 CLINICAL DATA:  T10-L2 fusion EXAM: LUMBAR SPINE - 2-3 VIEW; DG C-ARM 61-120 MIN COMPARISON:  08/06/2018 CT FINDINGS: Intraoperative spot films of the thoracolumbar spine are submitted postoperatively for interpretation.  Posterior rod and bipedicular screw fixation noted from T10-L1. The new posterior rods are clamped to the existing lumbar spine rods. Interbody fusion spacers are identified at L1-2. IMPRESSION: Surgical fusion changes from T10-L2 as described. Electronically Signed   By: Margarette Canada M.D.   On: 09/15/2018 19:33   Dg C-arm 1-60 Min  Result Date: 09/15/2018 CLINICAL DATA:  T10-L2 fusion EXAM: LUMBAR SPINE - 2-3 VIEW; DG C-ARM 61-120 MIN COMPARISON:  08/06/2018 CT FINDINGS: Intraoperative spot films of the thoracolumbar spine are submitted postoperatively for interpretation. Posterior rod and bipedicular screw fixation noted from T10-L1. The new posterior rods are clamped to the existing lumbar spine rods. Interbody fusion spacers are identified at L1-2. IMPRESSION: Surgical fusion changes from T10-L2 as described. Electronically Signed   By: Margarette Canada M.D.   On: 09/15/2018 19:33     Subjective: Feeling better, no complaints.  Pain is better controlled.  General = no fevers, chills, dizziness, malaise, fatigue HEENT/EYES = negative for pain, redness, loss of vision, double vision, blurred vision, loss of  hearing, sore throat, hoarseness, dysphagia Cardiovascular= negative for chest pain, palpitation, murmurs, lower extremity swelling Respiratory/lungs= negative for shortness of breath, cough, hemoptysis, wheezing, mucus production Gastrointestinal= negative for nausea, vomiting,, abdominal pain, melena, hematemesis Genitourinary= negative for Dysuria, Hematuria, Change in Urinary Frequency MSK = Negative for arthralgia, myalgias, Back Pain, Joint swelling  Neurology= Negative for headache, seizures, numbness, tingling  Psychiatry= Negative for anxiety, depression, suicidal and homocidal ideation Allergy/Immunology= Medication/Food allergy as listed  Skin= Negative for Rash, lesions, ulcers, itching    Discharge Exam: Vitals:   10/04/18 0500 10/04/18 0935  BP: (!) 141/69 103/60  Pulse: 60  62  Resp: 16 18  Temp: 97.9 F (36.6 C)   SpO2: 100% 99%   Vitals:   10/03/18 1757 10/03/18 2352 10/04/18 0500 10/04/18 0935  BP: 132/61 127/61 (!) 141/69 103/60  Pulse: 65 60 60 62  Resp: 16 16 16 18   Temp: (!) 97.5 F (36.4 C) 97.9 F (36.6 C) 97.9 F (36.6 C)   TempSrc: Oral Oral Oral   SpO2: 94% 94% 100% 99%  Weight:      Height:        General: Pt is alert, awake, not in acute distress Cardiovascular: RRR, S1/S2 +, no rubs, no gallops Respiratory: CTA bilaterally, no wheezing, no rhonchi Abdominal: Soft, NT, ND, bowel sounds + Extremities: no edema, no cyanosis    The results of significant diagnostics from this hospitalization (including imaging, microbiology, ancillary and laboratory) are listed below for reference.     Microbiology: No results found for this or any previous visit (from the past 240 hour(s)).   Labs: BNP (last 3 results) No results for input(s): BNP in the last 8760 hours. Basic Metabolic Panel: Recent Labs  Lab 09/30/18 1739 10/03/18 1138  NA 141 139  K 3.4* 3.2*  CL 104 103  CO2 24 25  GLUCOSE 130* 173*  BUN 17 16  CREATININE 0.85 1.01*  CALCIUM 10.0 10.1   Liver Function Tests: Recent Labs  Lab 09/30/18 1739 10/03/18 1138  AST 28 25  ALT 20 19  ALKPHOS 79 76  BILITOT 0.9 0.8  PROT 7.4 7.0  ALBUMIN 4.2 4.1   No results for input(s): LIPASE, AMYLASE in the last 168 hours. No results for input(s): AMMONIA in the last 168 hours. CBC: Recent Labs  Lab 09/30/18 1739 10/03/18 1138  WBC 6.9 5.1  NEUTROABS 4.1 3.7  HGB 10.8* 11.2*  HCT 33.1* 36.5  MCV 93.0 93.8  PLT 460* 452*   Cardiac Enzymes: No results for input(s): CKTOTAL, CKMB, CKMBINDEX, TROPONINI in the last 168 hours. BNP: Invalid input(s): POCBNP CBG: No results for input(s): GLUCAP in the last 168 hours. D-Dimer No results for input(s): DDIMER in the last 72 hours. Hgb A1c No results for input(s): HGBA1C in the last 72 hours. Lipid Profile No results  for input(s): CHOL, HDL, LDLCALC, TRIG, CHOLHDL, LDLDIRECT in the last 72 hours. Thyroid function studies No results for input(s): TSH, T4TOTAL, T3FREE, THYROIDAB in the last 72 hours.  Invalid input(s): FREET3 Anemia work up No results for input(s): VITAMINB12, FOLATE, FERRITIN, TIBC, IRON, RETICCTPCT in the last 72 hours. Urinalysis    Component Value Date/Time   COLORURINE YELLOW (A) 09/30/2018 1739   APPEARANCEUR CLEAR (A) 09/30/2018 1739   APPEARANCEUR Clear 05/03/2016 0958   LABSPEC 1.012 09/30/2018 1739   LABSPEC 1.012 03/21/2012 1615   PHURINE 7.0 09/30/2018 1739   GLUCOSEU NEGATIVE 09/30/2018 1739   GLUCOSEU NEGATIVE 05/31/2017 1456   HGBUR NEGATIVE 09/30/2018  [place] 09/30/2018 1739   BILIRUBINUR Negative 05/03/2016 0958   BILIRUBINUR Negative 03/21/2012 1615   KETONESUR 5 (A) 09/30/2018 1739   PROTEINUR NEGATIVE 09/30/2018 1739   UROBILINOGEN 0.2 05/31/2017 1456   NITRITE NEGATIVE 09/30/2018 1739   LEUKOCYTESUR NEGATIVE 09/30/2018 1739   LEUKOCYTESUR 1+ (A) 05/03/2016 0958   LEUKOCYTESUR 2+ 03/21/2012 1615   Sepsis Labs Invalid input(s): PROCALCITONIN,  WBC,  LACTICIDVEN Microbiology No results found for this or any previous visit (from the past 240 hour(s)).   Time coordinating discharge:  I have spent 35 minutes face to face with the patient and on the ward discussing the patients care, assessment, plan and disposition with other care givers. >50% of the time was devoted counseling the patient about the risks and benefits of treatment/Discharge disposition and coordinating care.   SIGNED:   Damita Lack, MD  [facility] 10/04/2018, 11:26 AM Pager   If 7PM-7AM, please contact night-coverage [email or web address] Password [email or web address]

## 2018-10-06 DIAGNOSIS — J301 Allergic rhinitis due to pollen: Secondary | ICD-10-CM | POA: Diagnosis not present

## 2018-10-07 ENCOUNTER — Other Ambulatory Visit (INDEPENDENT_AMBULATORY_CARE_PROVIDER_SITE_OTHER): Payer: PPO

## 2018-10-07 DIAGNOSIS — E7800 Pure hypercholesterolemia, unspecified: Secondary | ICD-10-CM

## 2018-10-07 DIAGNOSIS — E119 Type 2 diabetes mellitus without complications: Secondary | ICD-10-CM

## 2018-10-07 LAB — BASIC METABOLIC PANEL WITH GFR
BUN: 25 mg/dL — ABNORMAL HIGH (ref 6–23)
CO2: 30 meq/L (ref 19–32)
Calcium: 10.2 mg/dL (ref 8.4–10.5)
Chloride: 101 meq/L (ref 96–112)
Creatinine, Ser: 0.81 mg/dL (ref 0.40–1.20)
GFR: 73.58 mL/min
Glucose, Bld: 150 mg/dL — ABNORMAL HIGH (ref 70–99)
Potassium: 4.3 meq/L (ref 3.5–5.1)
Sodium: 139 meq/L (ref 135–145)

## 2018-10-07 LAB — HEPATIC FUNCTION PANEL
ALT: 11 U/L (ref 0–35)
AST: 14 U/L (ref 0–37)
Albumin: 4.2 g/dL (ref 3.5–5.2)
Alkaline Phosphatase: 87 U/L (ref 39–117)
Bilirubin, Direct: 0.1 mg/dL (ref 0.0–0.3)
Total Bilirubin: 0.5 mg/dL (ref 0.2–1.2)
Total Protein: 6.7 g/dL (ref 6.0–8.3)

## 2018-10-07 LAB — LIPID PANEL
Cholesterol: 162 mg/dL (ref 0–200)
HDL: 70.7 mg/dL
LDL Cholesterol: 64 mg/dL (ref 0–99)
NonHDL: 91.47
Total CHOL/HDL Ratio: 2
Triglycerides: 137 mg/dL (ref 0.0–149.0)
VLDL: 27.4 mg/dL (ref 0.0–40.0)

## 2018-10-07 LAB — HEMOGLOBIN A1C: Hgb A1c MFr Bld: 5.7 % (ref 4.6–6.5)

## 2018-10-08 ENCOUNTER — Other Ambulatory Visit (INDEPENDENT_AMBULATORY_CARE_PROVIDER_SITE_OTHER): Payer: PPO

## 2018-10-08 ENCOUNTER — Ambulatory Visit (INDEPENDENT_AMBULATORY_CARE_PROVIDER_SITE_OTHER): Payer: PPO | Admitting: Internal Medicine

## 2018-10-08 VITALS — BP 136/78 | HR 59 | Temp 98.1°F | Resp 18 | Wt 215.6 lb

## 2018-10-08 DIAGNOSIS — I10 Essential (primary) hypertension: Secondary | ICD-10-CM | POA: Diagnosis not present

## 2018-10-08 DIAGNOSIS — M545 Low back pain, unspecified: Secondary | ICD-10-CM

## 2018-10-08 DIAGNOSIS — D649 Anemia, unspecified: Secondary | ICD-10-CM

## 2018-10-08 DIAGNOSIS — E7800 Pure hypercholesterolemia, unspecified: Secondary | ICD-10-CM | POA: Diagnosis not present

## 2018-10-08 DIAGNOSIS — K219 Gastro-esophageal reflux disease without esophagitis: Secondary | ICD-10-CM

## 2018-10-08 DIAGNOSIS — F3341 Major depressive disorder, recurrent, in partial remission: Secondary | ICD-10-CM | POA: Diagnosis not present

## 2018-10-08 DIAGNOSIS — E119 Type 2 diabetes mellitus without complications: Secondary | ICD-10-CM

## 2018-10-08 DIAGNOSIS — I700 Atherosclerosis of aorta: Secondary | ICD-10-CM

## 2018-10-08 LAB — CBC WITH DIFFERENTIAL/PLATELET
Basophils Absolute: 0.1 10*3/uL (ref 0.0–0.1)
Basophils Relative: 1.1 % (ref 0.0–3.0)
Eosinophils Absolute: 0 10*3/uL (ref 0.0–0.7)
Eosinophils Relative: 0.7 % (ref 0.0–5.0)
HCT: 32.6 % — ABNORMAL LOW (ref 36.0–46.0)
Hemoglobin: 10.8 g/dL — ABNORMAL LOW (ref 12.0–15.0)
Lymphocytes Relative: 23.4 % (ref 12.0–46.0)
Lymphs Abs: 1.4 10*3/uL (ref 0.7–4.0)
MCHC: 33.2 g/dL (ref 30.0–36.0)
MCV: 91.1 fl (ref 78.0–100.0)
Monocytes Absolute: 0.5 10*3/uL (ref 0.1–1.0)
Monocytes Relative: 8.5 % (ref 3.0–12.0)
Neutro Abs: 3.9 10*3/uL (ref 1.4–7.7)
Neutrophils Relative %: 66.3 % (ref 43.0–77.0)
Platelets: 347 10*3/uL (ref 150.0–400.0)
RBC: 3.58 Mil/uL — ABNORMAL LOW (ref 3.87–5.11)
RDW: 16.7 % — ABNORMAL HIGH (ref 11.5–15.5)
WBC: 5.9 10*3/uL (ref 4.0–10.5)

## 2018-10-08 NOTE — Progress Notes (Signed)
Patient ID: Erin Camacho, female   DOB: 06-11-1946, 72 y.o.   MRN: [id number]   Subjective:    Patient ID: Erin Camacho, female    DOB: 06/10/1946, 72 y.o.   MRN: [id number]  HPI  Patient here for a scheduled follow up.  S/p lumbar decompression and extension of her prior fusion on 09/15/18.  Had worsening back pain.  Readmitted 10/03/18 and MRI revealed paraspinal seroma.  Neurosurgery reconsulted and recommended pain management.  She was started on gabapentin and zanaflex.  Decadron was also increased.  oxycodone refilled.  Recommend f/u with neurosurgery.  She reports still with increased pain.  Took her last oxycodone today.  Still taking zanaflex and gabapentin.  Reports otherwise doing well.  Pain limiting her activity.  Has f/u scheduled with neurosurgery next week.  No chest pain.  No sob.  No acid reflux.  States sugars doing well.  No abdominal pan.  Bowels are moving.  Needs f/u labs today.  Discussed recent lab results.     Past Medical History:  Diagnosis Date  . Allergy    hay fever  . Anxiety   . Arthritis   . Chronic back pain   . Diabetes mellitus    no meds      pt states she is not diabetic  . Dyspnea   . GERD (gastroesophageal reflux disease)   . History of hiatal hernia   . Hyperlipidemia   . Hypertension   . Pneumonia   . Spinal stenosis   . UTI (lower urinary tract infection)    Past Surgical History:  Procedure Laterality Date  . ABDOMINAL HYSTERECTOMY  1973  . ABDOMINAL HYSTERECTOMY    . APPENDECTOMY    . APPLICATION OF ROBOTIC ASSISTANCE FOR SPINAL PROCEDURE N/A 09/15/2018   Procedure: APPLICATION OF ROBOTIC ASSISTANCE FOR SPINAL PROCEDURE;  Surgeon: Kristeen Miss, MD;  Location: [facility];  Service: Neurosurgery;  Laterality: N/A;  . BACK SURGERY  04/17/13, 2017  . CHOLECYSTECTOMY  1995  . HERNIA REPAIR  08/2006  . lap band  08/2003  . SEPTOPLASTY  04/1978  . TONSILLECTOMY AND ADENOIDECTOMY  1979  . TOTAL HIP ARTHROPLASTY  02/2012   Right, Dr. Marry Guan    Family History  Problem Relation Age of Onset  . Hypertension Mother   . Stroke Mother   . Heart disease Mother   . Hypertension Father   . Stroke Father   . Heart disease Father   . Arthritis Sister   . Diabetes Sister   . Dementia Sister   . Cancer Sister   . Diabetes Brother   . Hypertension Brother   . Diabetes Brother   . Hypertension Brother   . Breast cancer Other 47  . Cancer Other        breast  . Diabetes Other   . Kidney disease Neg Hx   . Bladder Cancer Neg Hx    Social History   Socioeconomic History  . Marital status: Married    Spouse name: Not on file  . Number of children: Not on file  . Years of education: Not on file  . Highest education level: Not on file  Occupational History  . Not on file  Social Needs  . Financial resource strain: Not hard at all  . Food insecurity:    Worry: Never true    Inability: Never true  . Transportation needs:    Medical: No    Non-medical: No  Tobacco Use  . Smoking status:  Never Smoker  . Smokeless tobacco: Never Used  Substance and Sexual Activity  . Alcohol use: Yes    Alcohol/week: 1.0 standard drinks    Types: 1 Glasses of wine per week    Comment: occ  . Drug use: No  . Sexual activity: Never  Lifestyle  . Physical activity:    Days per week: Not on file    Minutes per session: Not on file  . Stress: Only a little  Relationships  . Social connections:    Talks on phone: Not on file    Gets together: Not on file    Attends religious service: Not on file    Active member of club or organization: Not on file    Attends meetings of clubs or organizations: Not on file    Relationship status: Not on file  Other Topics Concern  . Not on file  Social History Narrative   Lives in [place] with husband.      Work - retired Sports administrator    Outpatient Encounter Medications as of 10/08/2018  Medication Sig  . acetaminophen (TYLENOL) 500 MG tablet Take 1,000 mg by mouth every 6 (six) hours as  needed for moderate pain.  Marland Kitchen ALPRAZolam (XANAX) 0.25 MG tablet Take 1 tablet (0.25 mg total) by mouth daily as needed for anxiety.  . Cholecalciferol (VITAMIN D3) 2000 units TABS Take 2,000 Units by mouth daily.  Marland Kitchen dexamethasone (DECADRON) 2 MG tablet Take 1 tablet (2 mg total) by mouth 2 (two) times daily for 3 days, THEN 1 tablet (2 mg total) daily for 7 days.  . fexofenadine (ALLEGRA) 180 MG tablet Take 1 tablet (180 mg total) by mouth daily.  . fluticasone (FLONASE) 50 MCG/ACT nasal spray INSTILL 2 SPRAYS INTO EACH NOSTRIL EVERY DAY  . gabapentin (NEURONTIN) 100 MG capsule Take 1 capsule (100 mg total) by mouth 3 (three) times daily.  . Lidocaine HCl (ASPERCREME LIDOCAINE) 4 % LIQD Apply 1 application topically as needed (pain).  . Liniments (SALONPAS EX) Apply 1 application topically daily.  Marland Kitchen losartan-hydrochlorothiazide (HYZAAR) 50-12.5 MG tablet Take 1 tablet by mouth daily.  . metFORMIN (GLUCOPHAGE) 500 MG tablet Take 1 tablet (500 mg total) by mouth daily.  . Multiple Vitamin (MULTIVITAMIN) tablet Take 1 tablet by mouth daily.  . [EXPIRED] oxyCODONE-acetaminophen (PERCOCET/ROXICET) 5-325 MG tablet Take 1-2 tablets by mouth every 4 (four) hours as needed for up to 5 days for severe pain.  . pantoprazole (PROTONIX) 40 MG tablet TAKE 1 TABLET (40 MG TOTAL) BY MOUTH 2 (TWO) TIMES DAILY BEFORE A MEAL.  Marland Kitchen sertraline (ZOLOFT) 100 MG tablet TAKE 1/2 TABLET BY MOUTH (50 MG) BY MOUTH DAILY (Patient taking differently: Take 50 mg by mouth daily. )  . simvastatin (ZOCOR) 10 MG tablet TAKE ONE-HALF TABLET (5 MG TOTAL) BY MOUTH AT BEDTIME. (Patient taking differently: Take 5 mg by mouth at bedtime. )  . tiZANidine (ZANAFLEX) 2 MG tablet Take 1 tablet (2 mg total) by mouth every 8 (eight) hours as needed for muscle spasms.  . traZODone (DESYREL) 100 MG tablet TAKE 1 TABLET BY MOUTH EVERYDAY AT BEDTIME (Patient taking differently: Take 100 mg by mouth at bedtime. TAKE 1 TABLET BY MOUTH EVERYDAY AT  BEDTIME)  . [DISCONTINUED] fluticasone (FLONASE) 50 MCG/ACT nasal spray INSTILL 2 SPRAYS INTO EACH NOSTRIL EVERY DAY (Patient taking differently: Place 2 sprays into both nostrils daily. )  . [DISCONTINUED] HYDROmorphone (DILAUDID) 2 MG tablet Take 1 tablet (2 mg total) by mouth every  6 (six) hours as needed for severe pain.  . [DISCONTINUED] metoprolol succinate (TOPROL-XL) 25 MG 24 hr tablet TAKE ONE 1/2 tablet per day. (Patient taking differently: Take 12.5 mg by mouth daily. )  . [DISCONTINUED] polyethylene glycol (MIRALAX / GLYCOLAX) packet Take 17 g by mouth daily as needed for severe constipation.  . [DISCONTINUED] senna-docusate (SENOKOT-S) 8.6-50 MG tablet Take 2 tablets by mouth daily as needed for mild constipation or moderate constipation.   No facility-administered encounter medications on file as of 10/08/2018.     Review of Systems  Constitutional: Negative for appetite change and unexpected weight change.  HENT: Negative for congestion and sinus pressure.   Respiratory: Negative for cough, chest tightness and shortness of breath.   Cardiovascular: Negative for chest pain, palpitations and leg swelling.  Gastrointestinal: Negative for abdominal pain, diarrhea, nausea and vomiting.  Genitourinary: Negative for difficulty urinating and dysuria.  Musculoskeletal: Positive for back pain. Negative for joint swelling.  Skin: Negative for color change and rash.  Neurological: Negative for dizziness, light-headedness and headaches.  Psychiatric/Behavioral: Negative for agitation and dysphoric mood.       Objective:    Physical Exam  Constitutional: She appears well-developed and well-nourished. No distress.  HENT:  Nose: Nose normal.  Mouth/Throat: Oropharynx is clear and moist.  Neck: Neck supple. No thyromegaly present.  Cardiovascular: Normal rate and regular rhythm.  Pulmonary/Chest: Breath sounds normal. No respiratory distress. She has no wheezes.  Abdominal: Soft.  Bowel sounds are normal. There is no tenderness.  Musculoskeletal: She exhibits no edema or tenderness.  Lymphadenopathy:    She has no cervical adenopathy.  Skin: No rash noted. No erythema.  Psychiatric: She has a normal mood and affect. Her behavior is normal.    BP 136/78 (BP Location: Left Arm, Patient Position: Sitting, Cuff Size: Normal)   Pulse (!) 59   Temp 98.1 F (36.7 C) (Oral)   Resp 18   Wt 215 lb 9.6 oz (97.8 kg)   SpO2 97%   BMI 35.88 kg/m  Wt Readings from Last 3 Encounters:  10/08/18 215 lb 9.6 oz (97.8 kg)  10/03/18 218 lb 4.1 oz (99 kg)  09/30/18 218 lb (98.9 kg)     Lab Results  Component Value Date   WBC 5.9 10/08/2018   HGB 10.8 (L) 10/08/2018   HCT 32.6 (L) 10/08/2018   PLT 347.0 10/08/2018   GLUCOSE 150 (H) 10/07/2018   CHOL 162 10/07/2018   TRIG 137.0 10/07/2018   HDL 70.70 10/07/2018   LDLDIRECT 146.5 09/19/2012   LDLCALC 64 10/07/2018   ALT 11 10/07/2018   AST 14 10/07/2018   NA 139 10/07/2018   K 4.3 10/07/2018   CL 101 10/07/2018   CREATININE 0.81 10/07/2018   BUN 25 (H) 10/07/2018   CO2 30 10/07/2018   TSH 1.65 06/17/2018   INR 1.0 11/22/2015   HGBA1C 5.7 10/07/2018   MICROALBUR 1.2 06/16/2018       Assessment & Plan:   Problem List Items Addressed This Visit    Aortic atherosclerosis ([place])    On simvastatin.       Back pain    S/p lumbar decompression and extension of her prior fusion 09/15/18.  Surgery went well.  Now with increased back pain.  MRI revealed a paraspinal seroma.  Increased pain related to this.  On gabapentin and zanaflex.  Needs refill on oxycodone. Discussed with neurosurgery.  They will refill medication.  Keep f/u appt with neurosurgery next week.  Diabetes mellitus type 2, diet-controlled ([facility])    Low carb diet.  Recent a1c ok.  Continue low carb diet and exercise.  Follow met b and a1c.       Essential (primary) hypertension    Blood pressure under good control.  Continue same medication  regimen.  Follow pressures.  Follow metabolic panel.        GERD (gastroesophageal reflux disease)    Controlled on current regimen.        Hypercholesterolemia    On simvastatin.  Low cholesterol diet and exercise.  Follow lipid panel and liver function tests.       Major depression    Stable on sertraline.         Other Visit Diagnoses    Anemia, unspecified type    -  Primary   Relevant Orders   CBC with Differential/Platelet (Completed)       [name], MD

## 2018-10-09 ENCOUNTER — Other Ambulatory Visit: Payer: Self-pay | Admitting: Internal Medicine

## 2018-10-09 DIAGNOSIS — D649 Anemia, unspecified: Secondary | ICD-10-CM

## 2018-10-09 NOTE — Progress Notes (Signed)
Orders placed for f/u labs.  

## 2018-10-11 ENCOUNTER — Encounter: Payer: Self-pay | Admitting: Internal Medicine

## 2018-10-11 NOTE — Assessment & Plan Note (Signed)
Stable on sertraline.

## 2018-10-11 NOTE — Assessment & Plan Note (Signed)
Blood pressure under good control.  Continue same medication regimen.  Follow pressures.  Follow metabolic panel.   

## 2018-10-11 NOTE — Assessment & Plan Note (Signed)
On simvastatin.   

## 2018-10-11 NOTE — Assessment & Plan Note (Signed)
On simvastatin.  Low cholesterol diet and exercise.  Follow lipid panel and liver function tests.   

## 2018-10-11 NOTE — Assessment & Plan Note (Signed)
Low carb diet.  Recent a1c ok.  Continue low carb diet and exercise.  Follow met b and a1c.

## 2018-10-11 NOTE — Assessment & Plan Note (Signed)
Controlled on current regimen.   

## 2018-10-11 NOTE — Assessment & Plan Note (Signed)
S/p lumbar decompression and extension of her prior fusion 09/15/18.  Surgery went well.  Now with increased back pain.  MRI revealed a paraspinal seroma.  Increased pain related to this.  On gabapentin and zanaflex.  Needs refill on oxycodone. Discussed with neurosurgery.  They will refill medication.  Keep f/u appt with neurosurgery next week.

## 2018-10-13 DIAGNOSIS — J301 Allergic rhinitis due to pollen: Secondary | ICD-10-CM | POA: Diagnosis not present

## 2018-10-15 DIAGNOSIS — M48062 Spinal stenosis, lumbar region with neurogenic claudication: Secondary | ICD-10-CM | POA: Diagnosis not present

## 2018-10-20 ENCOUNTER — Encounter: Payer: Self-pay | Admitting: Dietician

## 2018-10-20 ENCOUNTER — Encounter: Payer: PPO | Attending: Internal Medicine | Admitting: Dietician

## 2018-10-20 ENCOUNTER — Other Ambulatory Visit (INDEPENDENT_AMBULATORY_CARE_PROVIDER_SITE_OTHER): Payer: PPO

## 2018-10-20 VITALS — BP 108/62 | Ht 65.0 in | Wt 219.2 lb

## 2018-10-20 DIAGNOSIS — E119 Type 2 diabetes mellitus without complications: Secondary | ICD-10-CM | POA: Insufficient documentation

## 2018-10-20 DIAGNOSIS — J301 Allergic rhinitis due to pollen: Secondary | ICD-10-CM | POA: Diagnosis not present

## 2018-10-20 DIAGNOSIS — D649 Anemia, unspecified: Secondary | ICD-10-CM

## 2018-10-20 NOTE — Progress Notes (Signed)
Diabetes Self-Management Education  Visit Type: First/Initial  Appt. Start Time:1530   Appt. End Time: 1630  10/20/2018  Erin Camacho, identified by name and date of birth, is a 72 y.o. female with a diagnosis of Diabetes: Type 2.   ASSESSMENT  Blood pressure 108/62, height 5\' 5"  (1.651 m), weight 219 lb 3.2 oz (99.4 kg). Body mass index is 36.48 kg/m.  Diabetes Self-Management Education - 10/20/18 1639      Visit Information   Visit Type  First/Initial      Initial Visit   Diabetes Type  Type 2      Health Coping   How would you rate your overall health?  Good      Psychosocial Assessment   Patient Belief/Attitude about Diabetes  Motivated to manage diabetes    Self-care barriers  None    Self-management support  Doctor's office;Family    Other persons present  Patient    Patient Concerns  Glycemic Control;Medication;Weight Control;Healthy Lifestyle   prevent complications   Special Needs  None    Preferred Learning Style  Auditory;Visual;Hands on    Learning Readiness  Ready    What is the last grade level you completed in school?  12      Pre-Education Assessment   Patient understands the diabetes disease and treatment process.  Needs Review    Patient undertands incorporating physical activity into lifestyle.  Needs Review    Patient understands using medications safely.  Needs Review    Patient understands monitoring blood glucose, interpreting and using results  Needs Review    Patient understands prevention, detection, and treatment of acute complications.  Needs Review    Patient understands prevention, detection, and treatment of chronic complications.  Needs Review    Patient understands how to develop strategies to address psychosocial issues.  Needs Review    Patient understands how to develop strategies to promote health/change behavior.  Needs Review      Complications   Last HgB A1C per patient/outside source  5.7 %   10-07-18   How often do you  check your blood sugar?  1-2 times/day    Fasting Blood glucose range (mg/dL)  70-129   ac supper 120's or below   Have you had a dilated eye exam in the past 12 months?  Yes   07-2018   Have you had a dental exam in the past 12 months?  Yes   08-2018   Are you checking your feet?  Yes    How many days per week are you checking your feet?  7      Dietary Intake   Breakfast  eats breakfast at 9a    Snack (morning)  none    Lunch  eats lunch at 1-1:30p    Snack (afternoon)  none    Dinner  eats supper at 6p    Snack (evening)  none    Beverage(s)  drinks water 2-3x/day and drinks sweetened with artificial sweetener 2-3x/day and milk x1/day      Exercise   Exercise Type  ADL's   exercise limited due to recent back surgery-wears a back brace     Patient Education   Previous Diabetes Education  No    Disease state   Definition of diabetes, type 1 and 2, and the diagnosis of diabetes    Nutrition management   Role of diet in the treatment of diabetes and the relationship between the three main macronutrients and blood glucose level;Food label  reading, portion sizes and measuring food.;Carbohydrate counting    Physical activity and exercise   Role of exercise on diabetes management, blood pressure control and cardiac health.;Helped patient identify appropriate exercises in relation to his/her diabetes, diabetes complications and other health issue.    Medications  Reviewed patients medication for diabetes, action, purpose, timing of dose and side effects.    Monitoring  Purpose and frequency of SMBG.;Yearly dilated eye exam;Taught/discussed recording of test results and interpretation of SMBG.;Identified appropriate SMBG and/or A1C goals.    Chronic complications  Relationship between chronic complications and blood glucose control;Retinopathy and reason for yearly dilated eye exams;Reviewed with patient heart disease, higher risk of, and prevention;Dental care;Nephropathy, what it is,  prevention of, the use of ACE, ARB's and early detection of through urine microalbumia.;Lipid levels, blood glucose control and heart disease    Personal strategies to promote health  Lifestyle issues that need to be addressed for better diabetes care;Helped patient develop diabetes management plan for (enter comment)      Outcomes   Expected Outcomes  Demonstrated interest in learning. Expect positive outcomes       Individualized Plan for Diabetes Self-Management Training:   Learning Objective:  Patient will have a greater understanding of diabetes self-management. Patient education plan is to attend individual and/or group sessions per assessed needs and concerns.   Plan:   Patient Instructions   Check blood sugars 2 x day before breakfast and 2 hrs after supper every day and record  Bring blood sugar records to the next appointment/class  Eat 3 meals day and 1 snack at bedtime  Eat 2-3 carbohydrate servings/meal + protein  Eat 1 carbohydrate serving/snack + protein  Space meals 4-6 hours apart  Avoid sugar sweetened drinks (soda, tea, coffee, sports drinks, juices)  Drink plenty of water  Limit intake of sweets/desserts, fried foods and snack foods  Get a Sharps container  Return for appointment/classes on:  10-23-18   Expected Outcomes:  Demonstrated interest in learning. Expect positive outcomes  Education material provided: General meal planning guidelines, Food Group handout  If problems or questions, patient to contact team via: [phone]  Future DSME appointment:  10-23-18

## 2018-10-20 NOTE — Patient Instructions (Addendum)
  Check blood sugars 2 x day before breakfast and 2 hrs after supper every day and record  Bring blood sugar records to the next appointment/class  Eat 3 meals day and 1 snack at bedtime  Eat 2-3 carbohydrate servings/meal + protein  Eat 1 carbohydrate serving/snack + protein  Space meals 4-6 hours apart  Avoid sugar sweetened drinks (soda, tea, coffee, sports drinks, juices)  Drink plenty of water  Limit intake of sweets/desserts, fried foods and snack foods  Get a Sharps container  Return for appointment/classes on:  10-23-18

## 2018-10-21 LAB — CBC WITH DIFFERENTIAL/PLATELET
Basophils Absolute: 0 10*3/uL (ref 0.0–0.1)
Basophils Relative: 0.5 % (ref 0.0–3.0)
Eosinophils Absolute: 0.2 10*3/uL (ref 0.0–0.7)
Eosinophils Relative: 3.1 % (ref 0.0–5.0)
HCT: 32.8 % — ABNORMAL LOW (ref 36.0–46.0)
Hemoglobin: 11 g/dL — ABNORMAL LOW (ref 12.0–15.0)
Lymphocytes Relative: 26.1 % (ref 12.0–46.0)
Lymphs Abs: 1.5 10*3/uL (ref 0.7–4.0)
MCHC: 33.6 g/dL (ref 30.0–36.0)
MCV: 90.4 fl (ref 78.0–100.0)
Monocytes Absolute: 0.6 10*3/uL (ref 0.1–1.0)
Monocytes Relative: 9.7 % (ref 3.0–12.0)
Neutro Abs: 3.5 10*3/uL (ref 1.4–7.7)
Neutrophils Relative %: 60.6 % (ref 43.0–77.0)
Platelets: 195 10*3/uL (ref 150.0–400.0)
RBC: 3.63 Mil/uL — ABNORMAL LOW (ref 3.87–5.11)
RDW: 16.2 % — ABNORMAL HIGH (ref 11.5–15.5)
WBC: 5.9 10*3/uL (ref 4.0–10.5)

## 2018-10-21 LAB — IBC PANEL
Iron: 44 ug/dL (ref 42–145)
Saturation Ratios: 12.1 % — ABNORMAL LOW (ref 20.0–50.0)
Transferrin: 260 mg/dL (ref 212.0–360.0)

## 2018-10-21 LAB — VITAMIN B12: Vitamin B-12: 482 pg/mL (ref 211–911)

## 2018-10-21 LAB — FERRITIN: Ferritin: 46 ng/mL (ref 10.0–291.0)

## 2018-10-23 ENCOUNTER — Telehealth: Payer: Self-pay | Admitting: *Deleted

## 2018-10-23 ENCOUNTER — Ambulatory Visit: Payer: PPO

## 2018-10-23 NOTE — Telephone Encounter (Signed)
Pt didn't show for Diabetes Class 1. She reported that she left a message with Lenell Antu that she wanted to reschedule for Jan due to recent back surgery. Gave her the dates for Jan 16, 23 and 30.

## 2018-10-27 ENCOUNTER — Other Ambulatory Visit: Payer: Self-pay

## 2018-10-27 ENCOUNTER — Encounter: Payer: Self-pay | Admitting: Emergency Medicine

## 2018-10-27 ENCOUNTER — Ambulatory Visit
Admission: EM | Admit: 2018-10-27 | Discharge: 2018-10-27 | Disposition: A | Discharge status: Home / Self Care | Payer: PPO | Attending: Family Medicine | Admitting: Family Medicine

## 2018-10-27 DIAGNOSIS — J301 Allergic rhinitis due to pollen: Secondary | ICD-10-CM | POA: Diagnosis not present

## 2018-10-27 DIAGNOSIS — M7551 Bursitis of right shoulder: Secondary | ICD-10-CM | POA: Diagnosis not present

## 2018-10-27 NOTE — ED Provider Notes (Signed)
[facility]    CSN: [id number] Arrival date & time: 10/27/18  1114     History   Chief Complaint Chief Complaint  Patient presents with  . Shoulder Pain    right    HPI BLAKLEE SHORES is a 72 y.o. female.   HPI  72 year old female presents with right shoulder pain that started about a week ago.  He undergone back surgery and has been using her arms to help assist her getting in and out of a chair. She thinks That is bursitis and she has had this before and feels the same.  Most of her pain is over the anterior shoulder indicating the coracoid area and also inferior to the deltoid.  She states that it hurts with abduction or with abduction and internal rotation.  No numbness or tingling into her arm.  She denies any neck pain.         Past Medical History:  Diagnosis Date  . Allergy    hay fever  . Anxiety   . Arthritis   . Chronic back pain   . Diabetes mellitus    no meds      pt states she is not diabetic  . Dyspnea   . GERD (gastroesophageal reflux disease)   . History of hiatal hernia   . Hyperlipidemia   . Hypertension   . Pneumonia   . Spinal stenosis   . UTI (lower urinary tract infection)     Patient Active Problem List   Diagnosis Date Noted  . Spinal stenosis 09/15/2018  . Lumbar stenosis with neurogenic claudication 09/15/2018  . Shortness of breath 06/19/2018  . Cough 04/13/2018  . Aortic atherosclerosis ([place]) 01/11/2018  . GERD (gastroesophageal reflux disease) 12/23/2016  . Atrophic vaginitis 03/18/2016  . Recurrent UTI 02/20/2016  . Spondylolisthesis at L5-S1 level 12/06/2015  . Preop examination 11/22/2015  . Rhinitis, allergic 11/22/2015  . Right leg pain 07/27/2014  . 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 Bilateral shoulder pain 02/24/2014  . Skin lesion 01/18/2014  . History of bariatric surgery 01/18/2014  . Back pain 05/25/2013  . Major depression 05/14/2013  . Major  depressive disorder with single episode 05/14/2013  . Status post lumbar spine operation 04/17/2013  . Healthcare maintenance 01/07/2013  . Hypercholesterolemia 08/22/2012  . HLD (hyperlipidemia) 08/22/2012  . Hypertension 07/07/2012  . Diabetes mellitus type 2, diet-controlled ([place]) 07/07/2012  . Insomnia 07/07/2012  . Obesity (BMI 30-39.9) 07/07/2012  . Essential (primary) hypertension 07/07/2012    Past Surgical History:  Procedure Laterality Date  . ABDOMINAL HYSTERECTOMY  1973  . ABDOMINAL HYSTERECTOMY    . APPENDECTOMY    . APPLICATION OF ROBOTIC ASSISTANCE FOR SPINAL PROCEDURE N/A 09/15/2018   Procedure: APPLICATION OF ROBOTIC ASSISTANCE FOR SPINAL PROCEDURE;  Surgeon: Kristeen Miss, MD;  Location: [facility];  Service: Neurosurgery;  Laterality: N/A;  . BACK SURGERY  04/17/13, 2017  . CHOLECYSTECTOMY  1995  . HERNIA REPAIR  08/2006  . lap band  08/2003  . SEPTOPLASTY  04/1978  . TONSILLECTOMY AND ADENOIDECTOMY  1979  . TOTAL HIP ARTHROPLASTY  02/2012   Right, Dr. Marry Guan    OB History   None      Home Medications    Prior to Admission medications   Medication Sig Start Date End Date Taking? Authorizing Provider  ALPRAZolam (XANAX) 0.25 MG tablet Take 1 tablet (0.25 mg total) by mouth daily as needed for anxiety. 10/04/18  Yes  Amin, Jeanella Flattery, MD  Cholecalciferol (VITAMIN D3) 2000 units TABS Take 2,000 Units by mouth daily.   Yes [provider]  fexofenadine (ALLEGRA) 180 MG tablet Take 1 tablet (180 mg total) by mouth daily. 01/18/14  Yes Jackolyn Confer, MD  fluticasone (FLONASE) 50 MCG/ACT nasal spray INSTILL 2 SPRAYS INTO EACH NOSTRIL EVERY DAY 09/24/18  Yes [name], MD  gabapentin (NEURONTIN) 100 MG capsule Take 1 capsule (100 mg total) by mouth 3 (three) times daily. 10/04/18 11/03/18 Yes Amin, Jeanella Flattery, MD  losartan-hydrochlorothiazide (HYZAAR) 50-12.5 MG tablet Take 1 tablet by mouth daily. 04/10/18  Yes [name], MD  metFORMIN  (GLUCOPHAGE) 500 MG tablet Take 1 tablet (500 mg total) by mouth daily. 08/22/18  Yes [name], MD  metoprolol succinate (TOPROL-XL) 25 MG 24 hr tablet TAKE ONE 1/2 TABLET PER DAY. 10/09/18  Yes [name], MD  Multiple Vitamin (MULTIVITAMIN) tablet Take 1 tablet by mouth daily.   Yes [provider]  pantoprazole (PROTONIX) 40 MG tablet TAKE 1 TABLET (40 MG TOTAL) BY MOUTH 2 (TWO) TIMES DAILY BEFORE A MEAL. 10/03/18  Yes [name], MD  sertraline (ZOLOFT) 100 MG tablet TAKE 1/2 TABLET BY MOUTH (50 MG) BY MOUTH DAILY Patient taking differently: Take 50 mg by mouth daily.  08/05/18  Yes [name], MD  simvastatin (ZOCOR) 10 MG tablet TAKE ONE-HALF TABLET (5 MG TOTAL) BY MOUTH AT BEDTIME. Patient taking differently: Take 5 mg by mouth at bedtime.  05/06/18  Yes [name], MD  tiZANidine (ZANAFLEX) 2 MG tablet Take 1 tablet (2 mg total) by mouth every 8 (eight) hours as needed for muscle spasms. 10/04/18  Yes Amin, Ankit Chirag, MD  traMADol (ULTRAM) 50 MG tablet 50 mg as needed.  10/15/18  Yes [provider]  traZODone (DESYREL) 100 MG tablet TAKE 1 TABLET BY MOUTH EVERYDAY AT BEDTIME Patient taking differently: Take 100 mg by mouth at bedtime. TAKE 1 TABLET BY MOUTH EVERYDAY AT BEDTIME 09/25/18  Yes [name], MD  Lidocaine HCl (ASPERCREME LIDOCAINE) 4 % LIQD Apply 1 application topically as needed (pain).    [provider]  Liniments (SALONPAS EX) Apply 1 application topically daily.    [provider]    Family History Family History  Problem Relation Age of Onset  . Hypertension Mother   . Stroke Mother   . Heart disease Mother   . Hypertension Father   . Stroke Father   . Heart disease Father   . Arthritis Sister   . Diabetes Sister   . Dementia Sister   . Cancer Sister   . Diabetes Brother   . Hypertension Brother   . Diabetes Brother   . Hypertension Brother   . Breast cancer Other 47  . Cancer Other         breast  . Diabetes Other   . Kidney disease Neg Hx   . Bladder Cancer Neg Hx     Social History Social History   Tobacco Use  . Smoking status: Never Smoker  . Smokeless tobacco: Never Used  Substance Use Topics  . Alcohol use: Yes    Alcohol/week: 0.0 - 1.0 standard drinks    Comment: occ  . Drug use: No     Allergies   Adhesive [tape]   Review of Systems Review of Systems  Constitutional: Positive for activity change. Negative for appetite change, chills, fatigue and fever.  Musculoskeletal: Positive for arthralgias and myalgias. Negative for neck pain and neck  stiffness.  All other systems reviewed and are negative.    Physical Exam Triage Vital Signs ED Triage Vitals  Enc Vitals Group     BP 10/27/18 1154 (!) 146/75     Pulse Rate 10/27/18 1154 62     Resp 10/27/18 1154 18     Temp 10/27/18 1154 98.5 F (36.9 C)     Temp Source 10/27/18 1154 Oral     SpO2 10/27/18 1154 100 %     Weight 10/27/18 1150 215 lb (97.5 kg)     Height 10/27/18 1150 5\' 5"  (1.651 m)     Head Circumference --      Peak Flow --      Pain Score 10/27/18 1149 2     Pain Loc --      Pain Edu? --      Excl. in [place]? --    No data found.  Updated Vital Signs BP (!) 146/75 (BP Location: Left Arm)   Pulse 62   Temp 98.5 F (36.9 C) (Oral)   Resp 18   Ht 5\' 5"  (1.651 m)   Wt 215 lb (97.5 kg)   SpO2 100%   BMI 35.78 kg/m   Visual Acuity Right Eye Distance:   Left Eye Distance:   Bilateral Distance:    Right Eye Near:   Left Eye Near:    Bilateral Near:     Physical Exam  Constitutional: She is oriented to person, place, and time. She appears well-developed and well-nourished. No distress.  HENT:  Head: Normocephalic.  Eyes: Pupils are equal, round, and reactive to light. Right eye exhibits no discharge. Left eye exhibits no discharge.  Neck: Normal range of motion. Neck supple.  Musculoskeletal: She exhibits tenderness.  Examination of the right dominant shoulder shows  decreased range of motion due to pain.  This particular noticeable with abduction ,extension and abduction with internal rotation.  Tenderness over the coracoid process.  Has a positive empty can sign but is able to hold against gravity fairly well.  Sensation is intact to light touch.  Neurological: She is alert and oriented to person, place, and time.  Skin: Skin is warm and dry. She is not diaphoretic.  Psychiatric: She has a normal mood and affect. Her behavior is normal. Thought content normal.  Nursing note and vitals reviewed.    UC Treatments / Results  Labs (all labs ordered are listed, but only abnormal results are displayed) Labs Reviewed - No data to display  EKG None  Radiology No results found.  Procedures Patient requested an injection into the shoulder.  I explained the procedure to the patient.  Posterior  approach was then elected.  The area was widely prepped with a Betadine swab sticks.  Approach was made along the posterior aspect of the acromion a thumbs breath inferior to the edge of the acromion and medially appproximally a thumbs breath..  2 cc of 1% lidocaine with 40 mg of Kenalog were injected through a 22-gauge 1 1/2 inch needle.  The patient had moderate relief of her symptoms following the injection.  She had tolerated the procedure well.  Medications Ordered in UC Medications - No data to display  Initial Impression / Assessment and Plan / UC Course  I have reviewed the triage vital signs and the nursing notes.  Pertinent labs & imaging results that were available during my care of the patient were reviewed by me and considered in my medical decision making (see chart for details).  Shoulder was injected at her request.  She had moderate relief of her symptoms.  She will take Tylenol for pain since she is on Zoloft.  To use ice on the area as necessary for discomfort.  Told her is not uncommon to have increased pain for the first day or so and then should  notice more relief from day 3 through 21.  Follow-up with her orthopedic surgeon.     Final Clinical Impressions(s) / UC Diagnoses   Final diagnoses:  Acute shoulder bursitis, right     Discharge Instructions     Apply ice 20 minutes out of every 2 hours 4-5 times daily for comfort.     ED Prescriptions    None     Controlled Substance Prescriptions [place] Controlled Substance Registry consulted? Not Applicable   Lorin Picket, PA-C 10/27/18 2054

## 2018-10-27 NOTE — Discharge Instructions (Addendum)
Apply ice 20 minutes out of every 2 hours 4-5 times daily for comfort.  °

## 2018-10-27 NOTE — ED Triage Notes (Signed)
Pt c/o right shoulder pain. Started about a week ago. She thinks it is bursitis because she has had it before. No known injury.

## 2018-10-30 ENCOUNTER — Ambulatory Visit: Payer: PPO

## 2018-10-31 ENCOUNTER — Other Ambulatory Visit: Payer: Self-pay | Admitting: Internal Medicine

## 2018-11-03 ENCOUNTER — Ambulatory Visit
Admission: RE | Admit: 2018-11-03 | Discharge: 2018-11-03 | Disposition: A | Discharge status: Home / Self Care | Payer: PPO | Source: Ambulatory Visit | Attending: Internal Medicine | Admitting: Internal Medicine

## 2018-11-03 ENCOUNTER — Ambulatory Visit: Payer: PPO

## 2018-11-03 DIAGNOSIS — Z1231 Encounter for screening mammogram for malignant neoplasm of breast: Secondary | ICD-10-CM | POA: Diagnosis present

## 2018-11-03 DIAGNOSIS — J301 Allergic rhinitis due to pollen: Secondary | ICD-10-CM | POA: Diagnosis not present

## 2018-11-04 DIAGNOSIS — Z96641 Presence of right artificial hip joint: Secondary | ICD-10-CM | POA: Diagnosis not present

## 2018-11-04 DIAGNOSIS — I10 Essential (primary) hypertension: Secondary | ICD-10-CM | POA: Diagnosis not present

## 2018-11-04 DIAGNOSIS — Z7984 Long term (current) use of oral hypoglycemic drugs: Secondary | ICD-10-CM | POA: Diagnosis not present

## 2018-11-04 DIAGNOSIS — M5416 Radiculopathy, lumbar region: Secondary | ICD-10-CM | POA: Diagnosis not present

## 2018-11-04 DIAGNOSIS — E119 Type 2 diabetes mellitus without complications: Secondary | ICD-10-CM | POA: Diagnosis not present

## 2018-11-04 DIAGNOSIS — E785 Hyperlipidemia, unspecified: Secondary | ICD-10-CM | POA: Diagnosis not present

## 2018-11-04 DIAGNOSIS — M48062 Spinal stenosis, lumbar region with neurogenic claudication: Secondary | ICD-10-CM | POA: Diagnosis not present

## 2018-11-04 DIAGNOSIS — Z981 Arthrodesis status: Secondary | ICD-10-CM | POA: Diagnosis not present

## 2018-11-04 DIAGNOSIS — Z4789 Encounter for other orthopedic aftercare: Secondary | ICD-10-CM | POA: Diagnosis not present

## 2018-11-10 DIAGNOSIS — J301 Allergic rhinitis due to pollen: Secondary | ICD-10-CM | POA: Diagnosis not present

## 2018-11-17 DIAGNOSIS — J301 Allergic rhinitis due to pollen: Secondary | ICD-10-CM | POA: Diagnosis not present

## 2018-11-22 ENCOUNTER — Other Ambulatory Visit: Payer: Self-pay | Admitting: Internal Medicine

## 2018-11-25 DIAGNOSIS — J301 Allergic rhinitis due to pollen: Secondary | ICD-10-CM | POA: Diagnosis not present

## 2018-12-01 ENCOUNTER — Ambulatory Visit (INDEPENDENT_AMBULATORY_CARE_PROVIDER_SITE_OTHER): Payer: PPO

## 2018-12-01 VITALS — BP 138/80 | HR 60 | Temp 98.2°F | Resp 16 | Ht 64.0 in | Wt 211.1 lb

## 2018-12-01 DIAGNOSIS — Z0000 Encounter for general adult medical examination without abnormal findings: Secondary | ICD-10-CM | POA: Diagnosis not present

## 2018-12-01 NOTE — Progress Notes (Signed)
Subjective:   Erin Camacho is a 73 y.o. female who presents for Medicare Annual (Subsequent) preventive examination.  Review of Systems:  No ROS.  Medicare Wellness Visit. Additional risk factors are reflected in the social history. Cardiac Risk Factors include: advanced age (>62men, >70 women);diabetes mellitus;hypertension     Objective:     Vitals: BP 138/80 (BP Location: Left Arm, Patient Position: Sitting, Cuff Size: Normal)   Pulse 60   Temp 98.2 F (36.8 C) (Oral)   Resp 16   Ht 5\' 4"  (1.626 m)   Wt 211 lb 1.9 oz (95.8 kg)   SpO2 98%   BMI 36.24 kg/m   Body mass index is 36.24 kg/m.  Advanced Directives 12/01/2018 10/27/2018 10/20/2018 10/03/2018 10/03/2018 09/30/2018 09/15/2018  Does Patient Have a Medical Advance Directive? Yes No - Yes Yes Yes No  Type of Advance Directive Living will - Living will Living will Living will Living will -  Does patient want to make changes to medical advance directive? No - Patient declined - - No - Patient declined - No - Patient declined -  Copy of [place] in Chart? - - - - - - -  Would patient like information on creating a medical advance directive? - - - No - Patient declined - No - Patient declined No - Patient declined  Pre-existing out of facility DNR order (yellow form or pink MOST form) - - - - - - -    Tobacco Social History   Tobacco Use  Smoking Status Never Smoker  Smokeless Tobacco Never Used     Counseling given: Not Answered   Clinical Intake:  Pre-visit preparation completed: Yes  Pain : 0-10 Pain Score: 5  Pain Location: Back Pain Orientation: Right(States she believes its more sore today due to bending. ) Pain Frequency: Constant Pain Relieving Factors: Medication. Rest. Sit/stand rotation.  Effect of Pain on Daily Activities: She paces herself  Pain Relieving Factors: Medication. Rest. Sit/stand rotation.   Diabetes: Yes(Followed by pcp.)  How often do you need to have  someone help you when you read instructions, pamphlets, or other written materials from your doctor or pharmacy?: 1 - Never  Interpreter Needed?: No     Past Medical History:  Diagnosis Date  . Allergy    hay fever  . Anxiety   . Arthritis   . Chronic back pain   . Diabetes mellitus    no meds      pt states she is not diabetic  . Dyspnea   . GERD (gastroesophageal reflux disease)   . History of hiatal hernia   . Hyperlipidemia   . Hypertension   . Pneumonia   . Spinal stenosis   . UTI (lower urinary tract infection)    Past Surgical History:  Procedure Laterality Date  . ABDOMINAL HYSTERECTOMY  1973  . ABDOMINAL HYSTERECTOMY    . APPENDECTOMY    . APPLICATION OF ROBOTIC ASSISTANCE FOR SPINAL PROCEDURE N/A 09/15/2018   Procedure: APPLICATION OF ROBOTIC ASSISTANCE FOR SPINAL PROCEDURE;  Surgeon: Kristeen Miss, MD;  Location: [facility];  Service: Neurosurgery;  Laterality: N/A;  . BACK SURGERY  04/17/13, 2017  . CHOLECYSTECTOMY  1995  . HERNIA REPAIR  08/2006  . lap band  08/2003  . SEPTOPLASTY  04/1978  . TONSILLECTOMY AND ADENOIDECTOMY  1979  . TOTAL HIP ARTHROPLASTY  02/2012   Right, Dr. Marry Guan   Family History  Problem Relation Age of Onset  . Hypertension Mother   .  Stroke Mother   . Heart disease Mother   . Hypertension Father   . Stroke Father   . Heart disease Father   . Arthritis Sister   . Diabetes Sister   . Dementia Sister   . Cancer Sister   . Diabetes Brother   . Hypertension Brother   . Diabetes Brother   . Hypertension Brother   . Breast cancer Other 47  . Cancer Other        breast  . Diabetes Other   . Kidney disease Neg Hx   . Bladder Cancer Neg Hx    Social History   Socioeconomic History  . Marital status: Married    Spouse name: Not on file  . Number of children: Not on file  . Years of education: Not on file  . Highest education level: Not on file  Occupational History  . Not on file  Social Needs  . Financial resource strain:  Not hard at all  . Food insecurity:    Worry: Never true    Inability: Never true  . Transportation needs:    Medical: No    Non-medical: No  Tobacco Use  . Smoking status: Never Smoker  . Smokeless tobacco: Never Used  Substance and Sexual Activity  . Alcohol use: Yes    Alcohol/week: 0.0 - 1.0 standard drinks    Comment: occ  . Drug use: No  . Sexual activity: Never  Lifestyle  . Physical activity:    Days per week: 0 days    Minutes per session: Not on file  . Stress: Only a little  Relationships  . Social connections:    Talks on phone: Not on file    Gets together: Not on file    Attends religious service: Not on file    Active member of club or organization: Not on file    Attends meetings of clubs or organizations: Not on file    Relationship status: Married  Other Topics Concern  . Not on file  Social History Narrative   Lives in [place] with husband.      Work - retired Sports administrator    Outpatient Encounter Medications as of 12/01/2018  Medication Sig  . ALPRAZolam (XANAX) 0.25 MG tablet Take 1 tablet (0.25 mg total) by mouth daily as needed for anxiety.  . Cholecalciferol (VITAMIN D3) 2000 units TABS Take 2,000 Units by mouth daily.  . fexofenadine (ALLEGRA) 180 MG tablet Take 1 tablet (180 mg total) by mouth daily.  . fluticasone (FLONASE) 50 MCG/ACT nasal spray INSTILL 2 SPRAYS INTO EACH NOSTRIL EVERY DAY  . Lidocaine HCl (ASPERCREME LIDOCAINE) 4 % LIQD Apply 1 application topically as needed (pain).  . Liniments (SALONPAS EX) Apply 1 application topically daily.  Marland Kitchen losartan-hydrochlorothiazide (HYZAAR) 50-12.5 MG tablet Take 1 tablet by mouth daily.  . metFORMIN (GLUCOPHAGE) 500 MG tablet TAKE 1 TABLET BY MOUTH EVERY DAY  . metoprolol succinate (TOPROL-XL) 25 MG 24 hr tablet TAKE ONE 1/2 TABLET PER DAY.  . Multiple Vitamin (MULTIVITAMIN) tablet Take 1 tablet by mouth daily.  . pantoprazole (PROTONIX) 40 MG tablet TAKE 1 TABLET (40 MG TOTAL) BY MOUTH 2  (TWO) TIMES DAILY BEFORE A MEAL.  Marland Kitchen sertraline (ZOLOFT) 100 MG tablet TAKE 1/2 TABLET BY MOUTH (50 MG) BY MOUTH DAILY (Patient taking differently: Take 50 mg by mouth daily. )  . simvastatin (ZOCOR) 10 MG tablet TAKE ONE-HALF TABLET (5 MG TOTAL) BY MOUTH AT BEDTIME. (Patient taking differently: Take 5  mg by mouth at bedtime. )  . tiZANidine (ZANAFLEX) 2 MG tablet Take 1 tablet (2 mg total) by mouth every 8 (eight) hours as needed for muscle spasms.  . traMADol (ULTRAM) 50 MG tablet 50 mg as needed.   . traZODone (DESYREL) 100 MG tablet TAKE 1 TABLET BY MOUTH EVERYDAY AT BEDTIME (Patient taking differently: Take 100 mg by mouth at bedtime. TAKE 1 TABLET BY MOUTH EVERYDAY AT BEDTIME)  . gabapentin (NEURONTIN) 100 MG capsule Take 1 capsule (100 mg total) by mouth 3 (three) times daily.   No facility-administered encounter medications on file as of 12/01/2018.     Activities of Daily Living In your present state of health, do you have any difficulty performing the following activities: 12/01/2018 10/03/2018  Hearing? N N  Vision? N Y  Difficulty concentrating or making decisions? N N  Walking or climbing stairs? Y Y  Dressing or bathing? N N  Doing errands, shopping? N Y  Conservation officer, nature and eating ? N -  Using the Toilet? N -  In the past six months, have you accidently leaked urine? N -  Do you have problems with loss of bowel control? N -  Managing your Medications? N -  Managing your Finances? N -  Housekeeping or managing your Housekeeping? N -  Some recent data might be hidden    Patient Care Team: [name], MD as PCP - General (Internal Medicine)    Assessment:   This is a routine wellness examination for [facility].  Notes the muscles in her back are a little more sore today; back surgery 08/2018. She believes it has increased due to bending over to tend to her dog. She does not wish to pursue further intervention with her doctor at this time.  Plans to monitor and take  medications as directed.  Keep all routine maintenance appointments. CPE scheduled 01/19/2019.  Diabetes- followed by pcp. Plans to start diabetic classes in March. EMMI diabetic meal planning educational material provided.   Health Screenings  Mammogram -11/03/18 [facility]Guard- 07/25/17 Glaucoma -none reported Hearing  -passes the whisper test Hemoglobin A1C -10/07/18 (5.2) Cholesterol -10/07/18 (162) Dental- every 6 months Vision- annual visits  Social  Alcohol intake -yes, rare Smoking history- none Smokers in home?none Illicit drug use?none Exercise -limited walking Diet -regular Sexually Active -never  Safety  Patient feels safe at home.  Patient does have smoke detectors at home  Patient does wear sunscreen or protective clothing when in direct sunlight  Patient does wear seat belt when driving or riding with others.   Activities of Daily Living Patient has help from husband and niece completing household chores. Denies needing assistance with: feeding themselves, getting from bed to chair, getting to the toilet, bathing/showering, dressing, managing money, or preparing meals. Ambulates with cane.  Depression Screen Patient denies losing interest in daily life, feeling hopeless, or crying easily over simple problems.   Fall Screen Patient denies being afraid of falling or falling in the last year.   Memory Screen Patient denies problems with memory, misplacing items, and is able to balance checkbook/bank accounts.  Paient is alert, normal appearance, oriented to person/place/and time. Correctly identified the president of the [place], recall of 3/3 objects, and performing simple calculations.  Patient displays appropriate judgement and can read correct time from watch face.   Immunizations The following Immunizations are up to date: Influenza, shingles, pneumonia, and tetanus.   Other Providers Patient Care Team: [name], MD as PCP - General (Internal  Medicine)  Exercise Activities and Dietary recommendations Current Exercise Habits: Home exercise routine, Type of exercise: walking, Intensity: Mild  Goals    . DIET - INCREASE WATER INTAKE     Place water/fluids in line of vision to help remind to drink       Fall Risk Fall Risk  12/01/2018 10/20/2018 11/28/2017 04/25/2017 01/29/2017  Falls in the past year? 0 0 No No No  Number falls in past yr: - - - - -  Injury with Fall? - - - - -  Follow up - - - - -   Depression Screen PHQ 2/9 Scores 12/01/2018 10/20/2018 11/28/2017 04/25/2017  PHQ - 2 Score 0 0 0 0     Cognitive Function MMSE - Mini Mental State Exam 11/28/2017 11/28/2016  Orientation to time 5 5  Orientation to Place 5 5  Registration 3 3  Attention/ Calculation 5 5  Recall 3 3  Language- name 2 objects 2 2  Language- repeat 1 1  Language- follow 3 step command 3 3  Language- read & follow direction 1 1  Write a sentence 1 1  Copy design 1 1  Total score 30 30     6CIT Screen 12/01/2018  What Year? 0 points  What month? 0 points  What time? 0 points  Count back from 20 0 points  Months in reverse 0 points  Repeat phrase 0 points  Total Score 0    Immunization History  Administered Date(s) Administered  . Influenza Split 09/25/2012  . Influenza Whole 09/07/2013  . Influenza, High Dose Seasonal PF 08/13/2016, 09/02/2017, 08/22/2018  . Influenza,inj,Quad PF,6+ Mos 07/27/2014, 08/25/2015  . Pneumococcal Conjugate-13 01/18/2014  . Pneumococcal Polysaccharide-23 03/15/2012  . Tdap 02/28/2012  . Zoster 08/27/2012   Screening Tests Health Maintenance  Topic Date Due  . FOOT EXAM  07/15/2014  . COLONOSCOPY  07/07/2017  . HEMOGLOBIN A1C  04/07/2019  . OPHTHALMOLOGY EXAM  08/09/2019  . MAMMOGRAM  11/04/2019  . TETANUS/TDAP  02/27/2022  . INFLUENZA VACCINE  Completed  . DEXA SCAN  Completed  . Hepatitis C Screening  Completed  . PNA vac Low Risk Adult  Completed      Plan:    End of life planning; Advance  aging; Advanced directives discussed. Copy of current HCPOA/Living Will on file.  I have personally reviewed and noted the following in the patient's chart:   . Medical and social history . Use of alcohol, tobacco or illicit drugs  . Current medications and supplements . Functional ability and status . Nutritional status . Physical activity . Advanced directives . List of other physicians . Hospitalizations, surgeries, and ER visits in previous 12 months . Vitals . Screenings to include cognitive, depression, and falls . Referrals and appointments  In addition, I have reviewed and discussed with patient certain preventive protocols, quality metrics, and best practice recommendations. A written personalized care plan for preventive services as well as general preventive health recommendations were provided to patient.     Varney Biles, LPN  2/68/3419   Reviewed above information.  Agree with assessment and plan.    Dr Nicki Reaper

## 2018-12-01 NOTE — Patient Instructions (Addendum)
  Ms. Zentz , Thank you for taking time to come for your Medicare Wellness Visit. I appreciate your ongoing commitment to your health goals. Please review the following plan we discussed and let me know if I can assist you in the future.   These are the goals we discussed: Goals    . DIET - INCREASE WATER INTAKE     Place water/fluids in line of vision to help remind to drink       This is a list of the screening recommended for you and due dates:  Health Maintenance  Topic Date Due  . Complete foot exam   07/15/2014  . Colon Cancer Screening  07/07/2017  . Hemoglobin A1C  04/07/2019  . Eye exam for diabetics  08/09/2019  . Mammogram  11/04/2019  . Tetanus Vaccine  02/27/2022  . Flu Shot  Completed  . DEXA scan (bone density measurement)  Completed  .  Hepatitis C: One time screening is recommended by Center for Disease Control  (CDC) for  adults born from [birth year] through 1966.   Completed  . Pneumonia vaccines  Completed

## 2018-12-02 DIAGNOSIS — J301 Allergic rhinitis due to pollen: Secondary | ICD-10-CM | POA: Diagnosis not present

## 2018-12-04 ENCOUNTER — Ambulatory Visit: Payer: PPO

## 2018-12-09 DIAGNOSIS — J301 Allergic rhinitis due to pollen: Secondary | ICD-10-CM | POA: Diagnosis not present

## 2018-12-10 DIAGNOSIS — M48062 Spinal stenosis, lumbar region with neurogenic claudication: Secondary | ICD-10-CM | POA: Diagnosis not present

## 2018-12-11 ENCOUNTER — Ambulatory Visit: Payer: PPO

## 2018-12-15 ENCOUNTER — Encounter: Payer: Self-pay | Admitting: Dietician

## 2018-12-15 NOTE — Progress Notes (Signed)
Called pt and rescheduled diabetes classes beginning on 01-29-19, 02-05-19, 02-12-19

## 2018-12-16 DIAGNOSIS — J301 Allergic rhinitis due to pollen: Secondary | ICD-10-CM | POA: Diagnosis not present

## 2018-12-18 ENCOUNTER — Ambulatory Visit: Payer: PPO

## 2018-12-21 ENCOUNTER — Other Ambulatory Visit: Payer: Self-pay | Admitting: Internal Medicine

## 2018-12-24 DIAGNOSIS — J301 Allergic rhinitis due to pollen: Secondary | ICD-10-CM | POA: Diagnosis not present

## 2018-12-25 ENCOUNTER — Other Ambulatory Visit: Payer: Self-pay | Admitting: Internal Medicine

## 2018-12-30 DIAGNOSIS — J301 Allergic rhinitis due to pollen: Secondary | ICD-10-CM | POA: Diagnosis not present

## 2019-01-01 ENCOUNTER — Other Ambulatory Visit: Payer: Self-pay | Admitting: Internal Medicine

## 2019-01-13 DIAGNOSIS — J301 Allergic rhinitis due to pollen: Secondary | ICD-10-CM | POA: Diagnosis not present

## 2019-01-17 ENCOUNTER — Other Ambulatory Visit: Payer: Self-pay | Admitting: Internal Medicine

## 2019-01-19 ENCOUNTER — Encounter: Payer: PPO | Admitting: Internal Medicine

## 2019-01-20 DIAGNOSIS — J301 Allergic rhinitis due to pollen: Secondary | ICD-10-CM | POA: Diagnosis not present

## 2019-01-27 DIAGNOSIS — M19071 Primary osteoarthritis, right ankle and foot: Secondary | ICD-10-CM | POA: Diagnosis not present

## 2019-01-27 DIAGNOSIS — J301 Allergic rhinitis due to pollen: Secondary | ICD-10-CM | POA: Diagnosis not present

## 2019-01-27 DIAGNOSIS — M2021 Hallux rigidus, right foot: Secondary | ICD-10-CM | POA: Diagnosis not present

## 2019-01-27 DIAGNOSIS — M79671 Pain in right foot: Secondary | ICD-10-CM | POA: Diagnosis not present

## 2019-01-29 ENCOUNTER — Encounter: Payer: Self-pay | Admitting: Dietician

## 2019-01-29 ENCOUNTER — Encounter: Payer: PPO | Attending: Internal Medicine | Admitting: Dietician

## 2019-01-29 ENCOUNTER — Other Ambulatory Visit: Payer: Self-pay

## 2019-01-29 VITALS — Ht 65.0 in | Wt 203.9 lb

## 2019-01-29 DIAGNOSIS — E119 Type 2 diabetes mellitus without complications: Secondary | ICD-10-CM | POA: Diagnosis present

## 2019-01-29 NOTE — Progress Notes (Signed)
Appt. Start Time:9:00am Appt. End Time: 12:00  Class 1 Diabetes Overview - define DM; state own type of DM; identify functions of pancreas and insulin; define insulin deficiency vs insulin resistance  Psychosocial - identify DM as a source of stress; state the effects of stress on BG control  Nutritional Management - describe effects of food on blood glucose; identify sources of carbohydrate, protein and fat; verbalize the importance of balance meals in controlling blood glucose  Exercise - describe the effects of exercise on blood glucose and importance of regular exercise in controlling diabetes; state a plan for personal exercise; verbalize contraindications for exercise  Self-Monitoring - state importance of SMBG; use SMBG results to effectively manage diabetes; identify importance of regular HbA1C testing and goals for results  Acute Complications - recognize hyperglycemia and hypoglycemia with causes and effects; identify blood glucose results as high, low or in control; list steps in treating and preventing high and low blood glucose  Chronic Complications/Foot, Skin, Eye Dental Care - identify possible long-term complications of diabetes (retinopathy, neuropathy, nephropathy, cardiovascular disease, infections); state importance of daily self-foot exams; describe how to examine feet and what to look for; explain appropriate eye and dental care  Lifestyle Changes/Goals & Health/Community Resources - state benefits of making appropriate lifestyle changes; identify habits that need to change (meals, tobacco, alcohol); identify strategies to reduce risk factors for personal health  Pregnancy/Sexual Health - define gestational diabetes; state importance of good blood glucose control and birth control prior to pregnancy; state importance of good blood glucose control in preventing sexual problems (impotence, vaginal dryness, infections, loss of desire); state relationship of blood glucose  control and pregnancy outcome; describe risk of maternal and fetal complications  Teaching Materials Used: Class 1 Slides/Notebook Diabetes Booklet ID Card  Medic Alert/Medic ID Forms Sleep Evaluation Exercise Handout Planning a Balanced Meal Goals for Class 1  

## 2019-02-03 DIAGNOSIS — J301 Allergic rhinitis due to pollen: Secondary | ICD-10-CM | POA: Diagnosis not present

## 2019-02-05 ENCOUNTER — Encounter: Payer: PPO | Admitting: *Deleted

## 2019-02-05 ENCOUNTER — Other Ambulatory Visit: Payer: Self-pay

## 2019-02-05 ENCOUNTER — Encounter: Payer: Self-pay | Admitting: *Deleted

## 2019-02-05 VITALS — Wt 203.8 lb

## 2019-02-05 DIAGNOSIS — E119 Type 2 diabetes mellitus without complications: Secondary | ICD-10-CM

## 2019-02-05 NOTE — Progress Notes (Signed)

## 2019-02-11 DIAGNOSIS — M25552 Pain in left hip: Secondary | ICD-10-CM | POA: Diagnosis not present

## 2019-02-12 ENCOUNTER — Encounter: Payer: Self-pay | Admitting: *Deleted

## 2019-02-12 ENCOUNTER — Other Ambulatory Visit: Payer: Self-pay

## 2019-02-12 ENCOUNTER — Encounter: Payer: PPO | Admitting: Dietician

## 2019-02-12 ENCOUNTER — Encounter: Payer: Self-pay | Admitting: Dietician

## 2019-02-12 VITALS — Wt 202.6 lb

## 2019-02-12 DIAGNOSIS — E119 Type 2 diabetes mellitus without complications: Secondary | ICD-10-CM

## 2019-02-12 DIAGNOSIS — G8929 Other chronic pain: Secondary | ICD-10-CM | POA: Diagnosis not present

## 2019-02-12 DIAGNOSIS — M25552 Pain in left hip: Secondary | ICD-10-CM | POA: Diagnosis not present

## 2019-02-12 DIAGNOSIS — M1612 Unilateral primary osteoarthritis, left hip: Secondary | ICD-10-CM | POA: Diagnosis not present

## 2019-02-12 NOTE — Progress Notes (Signed)
Appt. Start Time: 9:00am Appt. End Time: 12:00  Class 3 Diabetes Overview - identify functions of pancreas and insulin; define insulin deficiency vs insulin resistance  Medications - state name, dose, timing of currently prescribed medications; describe types of medications available for diabetes  Psychosocial - identify DM as a source of stress; state the effects of stress on BG control; verbalize appropriate stress management techniques; identify personal stress issues   Nutritional Management - use food labels to identify serving size, content of carbohydrate, fiber, protein, fat, saturated fat and sodium; recognize food sources of fat, saturated fat, trans fat, and sodium, and verbalize goals for intake; describe healthful, appropriate food choices when dining out   Exercise - state a plan for personal exercise; verbalize contraindications for exercise  Self-Monitoring - state importance of SMBG; use SMBG results to effectively manage diabetes; identify importance of regular HbA1C testing and goals for results  Acute Complications - recognize hyperglycemia and hypoglycemia with causes and effects; identify blood glucose results as high, low or in control; list steps in treating and preventing high and low blood glucose  Chronic Complications - state importance of daily self-foot exams; explain appropriate eye and dental care  Lifestyle Changes/Goals & Health/Community Resources - set goals for proper diabetes care; state need for and frequency of healthcare follow-up; describe appropriate community resources for good health (ADA, web sites, apps)   Teaching Materials Used: Class 3 Slide Packet Diabetes Stress Test Stress Management Tools Stress Poem Goal Setting Worksheet Website/App List    

## 2019-02-13 ENCOUNTER — Other Ambulatory Visit: Payer: Self-pay | Admitting: Internal Medicine

## 2019-02-13 ENCOUNTER — Encounter: Payer: Self-pay | Admitting: Internal Medicine

## 2019-02-13 ENCOUNTER — Other Ambulatory Visit: Payer: Self-pay

## 2019-02-13 ENCOUNTER — Ambulatory Visit (INDEPENDENT_AMBULATORY_CARE_PROVIDER_SITE_OTHER): Payer: PPO | Admitting: Internal Medicine

## 2019-02-13 VITALS — BP 138/84 | HR 60 | Temp 97.9°F | Resp 16 | Ht 65.0 in | Wt 202.0 lb

## 2019-02-13 DIAGNOSIS — N390 Urinary tract infection, site not specified: Secondary | ICD-10-CM | POA: Diagnosis not present

## 2019-02-13 DIAGNOSIS — E119 Type 2 diabetes mellitus without complications: Secondary | ICD-10-CM

## 2019-02-13 DIAGNOSIS — D649 Anemia, unspecified: Secondary | ICD-10-CM

## 2019-02-13 DIAGNOSIS — Z0000 Encounter for general adult medical examination without abnormal findings: Secondary | ICD-10-CM

## 2019-02-13 DIAGNOSIS — I10 Essential (primary) hypertension: Secondary | ICD-10-CM

## 2019-02-13 DIAGNOSIS — K219 Gastro-esophageal reflux disease without esophagitis: Secondary | ICD-10-CM

## 2019-02-13 DIAGNOSIS — M545 Low back pain, unspecified: Secondary | ICD-10-CM

## 2019-02-13 DIAGNOSIS — F320 Major depressive disorder, single episode, mild: Secondary | ICD-10-CM | POA: Diagnosis not present

## 2019-02-13 DIAGNOSIS — E7800 Pure hypercholesterolemia, unspecified: Secondary | ICD-10-CM

## 2019-02-13 DIAGNOSIS — F32A Depression, unspecified: Secondary | ICD-10-CM

## 2019-02-13 DIAGNOSIS — I700 Atherosclerosis of aorta: Secondary | ICD-10-CM

## 2019-02-13 LAB — HM DIABETES FOOT EXAM

## 2019-02-13 MED ORDER — ALPRAZOLAM 0.25 MG PO TABS: 0.2500 mg | ORAL_TABLET | Freq: Every day | ORAL | 0 refills | Status: DC | PRN

## 2019-02-13 NOTE — Assessment & Plan Note (Addendum)
Physical today 02/13/19.  Mammogram 11/03/18 - Birads I.  cologuard negative 07/2017.

## 2019-02-13 NOTE — Progress Notes (Signed)
Patient ID: Erin Camacho, female   DOB: 03/02/1946, 73 y.o.   MRN: [id number]   Subjective:    Patient ID: Erin Camacho, female    DOB: [birth year], 73 y.o.   MRN: [id number]  HPI  Patient here for her physical exam.  She reports she is doing better.  Pain is better.  Had back surgery in 08/2018.  Was having groin pain.  Saw ortho 02/11/19.  S/p injection.  Pain better.  Getting around better.  No chest pain.  No sob.  No cough or congestion.  No acid reflux.  No abdominal pain.  Bowels moving.  No urine change.  Sugars doing well.  Has f/u with Dr Ellene Route 02/2019.     Past Medical History:  Diagnosis Date  . Allergy    hay fever  . Anxiety   . Arthritis   . Chronic back pain   . Diabetes mellitus    no meds      pt states she is not diabetic  . Dyspnea   . GERD (gastroesophageal reflux disease)   . History of hiatal hernia   . Hyperlipidemia   . Hypertension   . Pneumonia   . Spinal stenosis   . UTI (lower urinary tract infection)    Past Surgical History:  Procedure Laterality Date  . ABDOMINAL HYSTERECTOMY  1973  . ABDOMINAL HYSTERECTOMY    . APPENDECTOMY    . APPLICATION OF ROBOTIC ASSISTANCE FOR SPINAL PROCEDURE N/A 09/15/2018   Procedure: APPLICATION OF ROBOTIC ASSISTANCE FOR SPINAL PROCEDURE;  Surgeon: Kristeen Miss, MD;  Location: [facility];  Service: Neurosurgery;  Laterality: N/A;  . BACK SURGERY  04/17/13, 2017  . CHOLECYSTECTOMY  1995  . HERNIA REPAIR  08/2006  . lap band  08/2003  . SEPTOPLASTY  04/1978  . TONSILLECTOMY AND ADENOIDECTOMY  1979  . TOTAL HIP ARTHROPLASTY  02/2012   Right, Dr. Marry Guan   Family History  Problem Relation Age of Onset  . Hypertension Mother   . Stroke Mother   . Heart disease Mother   . Hypertension Father   . Stroke Father   . Heart disease Father   . Arthritis Sister   . Diabetes Sister   . Dementia Sister   . Cancer Sister   . Diabetes Brother   . Hypertension Brother   . Diabetes Brother   . Hypertension Brother   . Breast  cancer Other 47  . Cancer Other        breast  . Diabetes Other   . Kidney disease Neg Hx   . Bladder Cancer Neg Hx    Social History   Socioeconomic History  . Marital status: Married    Spouse name: Not on file  . Number of children: Not on file  . Years of education: Not on file  . Highest education level: Not on file  Occupational History  . Not on file  Social Needs  . Financial resource strain: Not hard at all  . Food insecurity:    Worry: Never true    Inability: Never true  . Transportation needs:    Medical: No    Non-medical: No  Tobacco Use  . Smoking status: Never Smoker  . Smokeless tobacco: Never Used  Substance and Sexual Activity  . Alcohol use: Yes    Alcohol/week: 0.0 - 1.0 standard drinks    Comment: occ  . Drug use: No  . Sexual activity: Never  Lifestyle  . Physical activity:  Days per week: 0 days    Minutes per session: Not on file  . Stress: Only a little  Relationships  . Social connections:    Talks on phone: Not on file    Gets together: Not on file    Attends religious service: Not on file    Active member of club or organization: Not on file    Attends meetings of clubs or organizations: Not on file    Relationship status: Married  Other Topics Concern  . Not on file  Social History Narrative   Lives in [place] with husband.      Work - retired Sports administrator    Outpatient Encounter Medications as of 02/13/2019  Medication Sig  . ALPRAZolam (XANAX) 0.25 MG tablet Take 1 tablet (0.25 mg total) by mouth daily as needed for anxiety.  . Cholecalciferol (VITAMIN D3) 2000 units TABS Take 2,000 Units by mouth daily.  . fexofenadine (ALLEGRA) 180 MG tablet Take 1 tablet (180 mg total) by mouth daily.  . fluticasone (FLONASE) 50 MCG/ACT nasal spray INSTILL 2 SPRAYS INTO EACH NOSTRIL EVERY DAY  . Lidocaine HCl (ASPERCREME LIDOCAINE) 4 % LIQD Apply 1 application topically as needed (pain).  . Liniments (SALONPAS EX) Apply 1  application topically daily.  Marland Kitchen losartan-hydrochlorothiazide (HYZAAR) 50-12.5 MG tablet TAKE 1 TABLET BY MOUTH EVERY DAY  . metFORMIN (GLUCOPHAGE) 500 MG tablet TAKE 1 TABLET BY MOUTH EVERY DAY  . metoprolol succinate (TOPROL-XL) 25 MG 24 hr tablet TAKE ONE 1/2 TABLET PER DAY.  . Multiple Vitamin (MULTIVITAMIN) tablet Take 1 tablet by mouth daily.  . pantoprazole (PROTONIX) 40 MG tablet TAKE 1 TABLET (40 MG TOTAL) BY MOUTH 2 (TWO) TIMES DAILY BEFORE A MEAL.  Marland Kitchen sertraline (ZOLOFT) 100 MG tablet TAKE 1/2 TABLET BY MOUTH (50 MG) BY MOUTH DAILY (Patient taking differently: Take 50 mg by mouth daily. )  . simvastatin (ZOCOR) 10 MG tablet TAKE ONE-HALF TABLET (5 MG TOTAL) BY MOUTH AT BEDTIME. (Patient taking differently: Take 5 mg by mouth at bedtime. )  . tiZANidine (ZANAFLEX) 2 MG tablet Take 1 tablet (2 mg total) by mouth every 8 (eight) hours as needed for muscle spasms.  . traMADol (ULTRAM) 50 MG tablet 50 mg as needed.   . traZODone (DESYREL) 100 MG tablet TAKE 1 TABLET BY MOUTH EVERYDAY AT BEDTIME  . [DISCONTINUED] ALPRAZolam (XANAX) 0.25 MG tablet Take 1 tablet (0.25 mg total) by mouth daily as needed for anxiety.   No facility-administered encounter medications on file as of 02/13/2019.     Review of Systems  Constitutional: Negative for appetite change and unexpected weight change.  HENT: Negative for congestion and sinus pressure.   Eyes: Negative for pain and visual disturbance.  Respiratory: Negative for cough, chest tightness and shortness of breath.   Cardiovascular: Negative for chest pain, palpitations and leg swelling.  Gastrointestinal: Negative for abdominal pain, diarrhea, nausea and vomiting.  Genitourinary: Negative for difficulty urinating and dysuria.  Musculoskeletal: Negative for joint swelling and myalgias.  Skin: Negative for color change and rash.  Neurological: Negative for dizziness, light-headedness and headaches.  Hematological: Negative for adenopathy. Does not  bruise/bleed easily.  Psychiatric/Behavioral: Negative for agitation and dysphoric mood.       Objective:    Physical Exam Constitutional:      General: She is not in acute distress.    Appearance: Normal appearance. She is well-developed.  HENT:     Nose: Nose normal. No congestion.     Mouth/Throat:  Pharynx: No oropharyngeal exudate or posterior oropharyngeal erythema.  Eyes:     General: No scleral icterus.       Right eye: No discharge.        Left eye: No discharge.  Neck:     Musculoskeletal: Neck supple. No muscular tenderness.     Thyroid: No thyromegaly.  Cardiovascular:     Rate and Rhythm: Normal rate and regular rhythm.  Pulmonary:     Effort: No tachypnea, accessory muscle usage or respiratory distress.     Breath sounds: Normal breath sounds. No decreased breath sounds or wheezing.  Chest:     Breasts:        Right: No inverted nipple, mass, nipple discharge or tenderness (no axillary adenopathy).        Left: No inverted nipple, mass, nipple discharge or tenderness (no axilarry adenopathy).  Abdominal:     General: Bowel sounds are normal.     Palpations: Abdomen is soft.     Tenderness: There is no abdominal tenderness.  Musculoskeletal:        General: No swelling or tenderness.  Lymphadenopathy:     Cervical: No cervical adenopathy.  Skin:    Findings: No erythema or rash.  Neurological:     Mental Status: She is alert and oriented to person, place, and time.  Psychiatric:        Mood and Affect: Mood normal.        Behavior: Behavior normal.     BP 138/84   Pulse 60   Temp 97.9 F (36.6 C) (Oral)   Resp 16   Ht '5\' 5"'$  (1.651 m)   Wt 202 lb (91.6 kg)   SpO2 98%   BMI 33.61 kg/m  Wt Readings from Last 3 Encounters:  02/13/19 202 lb (91.6 kg)  02/12/19 202 lb 9.6 oz (91.9 kg)  02/05/19 203 lb 12.8 oz (92.4 kg)     Lab Results  Component Value Date   WBC 8.6 02/13/2019   HGB 12.6 02/13/2019   HCT 37.7 02/13/2019   PLT 251  02/13/2019   GLUCOSE 123 (H) 02/13/2019   CHOL 172 02/13/2019   TRIG 66 02/13/2019   HDL 72 02/13/2019   LDLDIRECT 146.5 09/19/2012   LDLCALC 85 02/13/2019   ALT 11 02/13/2019   AST 17 02/13/2019   NA 139 02/13/2019   K 3.7 02/13/2019   CL 103 02/13/2019   CREATININE 0.87 02/13/2019   BUN 18 02/13/2019   CO2 23 02/13/2019   TSH 0.55 02/13/2019   INR 1.0 11/22/2015   HGBA1C 6.1 (H) 02/13/2019   MICROALBUR 0.2 02/13/2019    Mm 3d Screen Breast Bilateral  Result Date: 11/03/2018 CLINICAL DATA:  Screening. EXAM: DIGITAL SCREENING BILATERAL MAMMOGRAM WITH TOMO AND CAD COMPARISON:  Previous exam(s). ACR Breast Density Category a: The breast tissue is almost entirely fatty. FINDINGS: There are no findings suspicious for malignancy. Images were processed with CAD. IMPRESSION: No mammographic evidence of malignancy. A result letter of this screening mammogram will be mailed directly to the patient. RECOMMENDATION: Screening mammogram in one year. (Code:SM-B-01Y) BI-RADS CATEGORY  1: Negative. Electronically Signed   By: Marin Olp M.D.   On: 11/03/2018 17:03       Assessment & Plan:   Problem List Items Addressed This Visit    Aortic atherosclerosis ([place])    On simvastatin.       Back pain    S/p lumbar decompression and extension of her prior fusion 09/15/18.  Sees  Dr Ellene Route.  Was just evaluated by ortho for groin pain.  S/p injection.  Doing better.  Pain better.        Diabetes mellitus type 2, diet-controlled ([facility])    Low carb diet and exercise.  Sugars per her report are doing well.  Follow met b and a1c.        Relevant Orders   Hemoglobin A1c (Completed)   Basic metabolic panel (Completed)   Microalbumin / creatinine urine ratio (Completed)   Essential (primary) hypertension    Blood pressure on recheck improved.  Have her spot check her pressure.  Continue current medications.  Follow pressures.  Follow metabolic panel.        Relevant Orders   TSH (Completed)    GERD (gastroesophageal reflux disease)    Controlled.        Healthcare maintenance    Physical today 02/13/19.  Mammogram 11/03/18 - Birads I.  cologuard negative 07/2017.          Hypercholesterolemia    On simvastatin.  Low cholesterol diet and exercise.  Follow lipid panel and liver function tests.        Relevant Orders   Hepatic function panel (Completed)   Lipid panel (Completed)   Mild depression (HCC)    Stable on current regimen.  Follow.        Relevant Medications   ALPRAZolam (XANAX) 0.25 MG tablet   Recurrent UTI   Relevant Orders   Urinalysis, Routine w reflex microscopic (Completed)   Urine Culture (Completed)   MICROSCOPIC MESSAGE (Completed)    Other Visit Diagnoses    Routine general medical examination at a health care facility    -  Primary   Anemia, unspecified type       Relevant Orders   CBC with Differential/Platelet (Completed)   Ferritin (Completed)       [name], MD

## 2019-02-14 LAB — URINALYSIS, ROUTINE W REFLEX MICROSCOPIC
Bacteria, UA: NONE SEEN /HPF
Bilirubin Urine: NEGATIVE
Glucose, UA: NEGATIVE
Hgb urine dipstick: NEGATIVE
Hyaline Cast: NONE SEEN /LPF
Ketones, ur: NEGATIVE
Nitrite: NEGATIVE
Protein, ur: NEGATIVE
RBC / HPF: NONE SEEN /HPF (ref 0–2)
Specific Gravity, Urine: 1.012 (ref 1.001–1.03)
Squamous Epithelial / HPF: NONE SEEN /HPF
WBC, UA: NONE SEEN /HPF (ref 0–5)
pH: 7 (ref 5.0–8.0)

## 2019-02-14 LAB — HEPATIC FUNCTION PANEL
AG Ratio: 1.7 (calc) (ref 1.0–2.5)
ALT: 11 U/L (ref 6–29)
AST: 17 U/L (ref 10–35)
Albumin: 4.5 g/dL (ref 3.6–5.1)
Alkaline phosphatase (APISO): 85 U/L (ref 37–153)
Bilirubin, Direct: 0.2 mg/dL (ref 0.0–0.2)
Globulin: 2.6 g/dL (ref 1.9–3.7)
Indirect Bilirubin: 0.5 mg/dL (ref 0.2–1.2)
Total Bilirubin: 0.7 mg/dL (ref 0.2–1.2)
Total Protein: 7.1 g/dL (ref 6.1–8.1)

## 2019-02-14 LAB — CBC WITH DIFFERENTIAL/PLATELET
Absolute Monocytes: 791 {cells}/uL (ref 200–950)
Basophils Absolute: 17 {cells}/uL (ref 0–200)
Basophils Relative: 0.2 %
Eosinophils Absolute: 9 {cells}/uL — ABNORMAL LOW (ref 15–500)
Eosinophils Relative: 0.1 %
HCT: 37.7 % (ref 35.0–45.0)
Hemoglobin: 12.6 g/dL (ref 11.7–15.5)
Lymphs Abs: 1608 {cells}/uL (ref 850–3900)
MCH: 29.1 pg (ref 27.0–33.0)
MCHC: 33.4 g/dL (ref 32.0–36.0)
MCV: 87.1 fL (ref 80.0–100.0)
MPV: 10.8 fL (ref 7.5–12.5)
Monocytes Relative: 9.2 %
Neutro Abs: 6175 {cells}/uL (ref 1500–7800)
Neutrophils Relative %: 71.8 %
Platelets: 251 10*3/uL (ref 140–400)
RBC: 4.33 Million/uL (ref 3.80–5.10)
RDW: 14.3 % (ref 11.0–15.0)
Total Lymphocyte: 18.7 %
WBC: 8.6 10*3/uL (ref 3.8–10.8)

## 2019-02-14 LAB — LIPID PANEL
Cholesterol: 172 mg/dL
HDL: 72 mg/dL
LDL Cholesterol (Calc): 85 mg/dL
Non-HDL Cholesterol (Calc): 100 mg/dL
Total CHOL/HDL Ratio: 2.4 (calc)
Triglycerides: 66 mg/dL

## 2019-02-14 LAB — URINE CULTURE
MICRO NUMBER:: 358774
SPECIMEN QUALITY:: ADEQUATE

## 2019-02-14 LAB — HEMOGLOBIN A1C
Hgb A1c MFr Bld: 6.1 %{Hb} — ABNORMAL HIGH
Mean Plasma Glucose: 128 (calc)
eAG (mmol/L): 7.1 (calc)

## 2019-02-14 LAB — FERRITIN: Ferritin: 27 ng/mL (ref 16–288)

## 2019-02-14 LAB — BASIC METABOLIC PANEL WITH GFR
BUN: 18 mg/dL (ref 7–25)
CO2: 23 mmol/L (ref 20–32)
Calcium: 10.7 mg/dL — ABNORMAL HIGH (ref 8.6–10.4)
Chloride: 103 mmol/L (ref 98–110)
Creat: 0.87 mg/dL (ref 0.60–0.93)
Glucose, Bld: 123 mg/dL — ABNORMAL HIGH (ref 65–99)
Potassium: 3.7 mmol/L (ref 3.5–5.3)
Sodium: 139 mmol/L (ref 135–146)

## 2019-02-14 LAB — MICROALBUMIN / CREATININE URINE RATIO
Creatinine, Urine: 55 mg/dL (ref 20–275)
Microalb Creat Ratio: 4 ug/mg{creat}
Microalb, Ur: 0.2 mg/dL

## 2019-02-14 LAB — TSH: TSH: 0.55 m[IU]/L (ref 0.40–4.50)

## 2019-02-15 ENCOUNTER — Encounter: Payer: Self-pay | Admitting: Internal Medicine

## 2019-02-15 NOTE — Assessment & Plan Note (Signed)
Controlled.  

## 2019-02-15 NOTE — Assessment & Plan Note (Signed)
S/p lumbar decompression and extension of her prior fusion 09/15/18.  Sees Dr Ellene Route.  Was just evaluated by ortho for groin pain.  S/p injection.  Doing better.  Pain better.

## 2019-02-15 NOTE — Assessment & Plan Note (Signed)
On simvastatin.  Low cholesterol diet and exercise.  Follow lipid panel and liver function tests.   

## 2019-02-15 NOTE — Assessment & Plan Note (Signed)
On simvastatin.   

## 2019-02-15 NOTE — Assessment & Plan Note (Signed)
Blood pressure on recheck improved.  Have her spot check her pressure.  Continue current medications.  Follow pressures.  Follow metabolic panel.

## 2019-02-15 NOTE — Assessment & Plan Note (Signed)
Low carb diet and exercise.  Sugars per her report are doing well.  Follow met b and a1c.

## 2019-02-15 NOTE — Assessment & Plan Note (Signed)
Stable on current regimen.  Follow.   

## 2019-02-16 ENCOUNTER — Encounter: Payer: Self-pay | Admitting: Dietician

## 2019-03-07 ENCOUNTER — Other Ambulatory Visit: Payer: Self-pay | Admitting: Internal Medicine

## 2019-03-11 DIAGNOSIS — M48062 Spinal stenosis, lumbar region with neurogenic claudication: Secondary | ICD-10-CM | POA: Diagnosis not present

## 2019-03-17 ENCOUNTER — Telehealth: Payer: Self-pay | Admitting: *Deleted

## 2019-03-17 NOTE — Telephone Encounter (Signed)
Please place future lab orders for appt on 4/29.  Thanks

## 2019-03-17 NOTE — Telephone Encounter (Signed)
Order placed for f/u lab.   

## 2019-03-18 ENCOUNTER — Other Ambulatory Visit (INDEPENDENT_AMBULATORY_CARE_PROVIDER_SITE_OTHER): Payer: PPO

## 2019-03-18 ENCOUNTER — Other Ambulatory Visit: Payer: Self-pay

## 2019-03-18 ENCOUNTER — Encounter: Payer: Self-pay | Admitting: Internal Medicine

## 2019-03-18 LAB — CALCIUM: Calcium: 9.5 mg/dL (ref 8.4–10.5)

## 2019-03-19 ENCOUNTER — Other Ambulatory Visit: Payer: Self-pay | Admitting: Internal Medicine

## 2019-03-20 DIAGNOSIS — J301 Allergic rhinitis due to pollen: Secondary | ICD-10-CM | POA: Diagnosis not present

## 2019-03-24 DIAGNOSIS — J301 Allergic rhinitis due to pollen: Secondary | ICD-10-CM | POA: Diagnosis not present

## 2019-03-24 DIAGNOSIS — M1612 Unilateral primary osteoarthritis, left hip: Secondary | ICD-10-CM | POA: Diagnosis not present

## 2019-03-24 DIAGNOSIS — Z96641 Presence of right artificial hip joint: Secondary | ICD-10-CM | POA: Diagnosis not present

## 2019-03-25 DIAGNOSIS — J301 Allergic rhinitis due to pollen: Secondary | ICD-10-CM | POA: Diagnosis not present

## 2019-03-25 DIAGNOSIS — H9319 Tinnitus, unspecified ear: Secondary | ICD-10-CM | POA: Diagnosis not present

## 2019-03-25 DIAGNOSIS — J310 Chronic rhinitis: Secondary | ICD-10-CM | POA: Diagnosis not present

## 2019-03-31 DIAGNOSIS — J301 Allergic rhinitis due to pollen: Secondary | ICD-10-CM | POA: Diagnosis not present

## 2019-04-01 ENCOUNTER — Other Ambulatory Visit: Payer: Self-pay | Admitting: Internal Medicine

## 2019-04-02 ENCOUNTER — Other Ambulatory Visit: Payer: Self-pay | Admitting: Internal Medicine

## 2019-04-07 DIAGNOSIS — J301 Allergic rhinitis due to pollen: Secondary | ICD-10-CM | POA: Diagnosis not present

## 2019-04-14 DIAGNOSIS — J301 Allergic rhinitis due to pollen: Secondary | ICD-10-CM | POA: Diagnosis not present

## 2019-04-20 ENCOUNTER — Ambulatory Visit: Payer: Self-pay | Admitting: Family Medicine

## 2019-04-20 ENCOUNTER — Ambulatory Visit: Payer: Self-pay

## 2019-04-20 ENCOUNTER — Other Ambulatory Visit: Payer: Self-pay

## 2019-04-20 ENCOUNTER — Ambulatory Visit: Payer: Self-pay | Admitting: Hematology

## 2019-04-20 ENCOUNTER — Telehealth: Payer: Self-pay | Admitting: Family Medicine

## 2019-04-20 ENCOUNTER — Ambulatory Visit
Admission: EM | Admit: 2019-04-20 | Discharge: 2019-04-20 | Disposition: A | Discharge status: Home / Self Care | Payer: PPO | Attending: Family Medicine | Admitting: Family Medicine

## 2019-04-20 DIAGNOSIS — W540XXA Bitten by dog, initial encounter: Secondary | ICD-10-CM

## 2019-04-20 DIAGNOSIS — Z23 Encounter for immunization: Secondary | ICD-10-CM

## 2019-04-20 DIAGNOSIS — S41151A Open bite of right upper arm, initial encounter: Secondary | ICD-10-CM

## 2019-04-20 MED ORDER — AMOXICILLIN-POT CLAVULANATE 875-125 MG PO TABS: 1.0000 | ORAL_TABLET | Freq: Two times a day (BID) | ORAL | 0 refills | Status: DC

## 2019-04-20 MED ORDER — TETANUS-DIPHTH-ACELL PERTUSSIS 5-2.5-18.5 LF-MCG/0.5 IM SUSP
0.5000 mL | Freq: Once | INTRAMUSCULAR | Status: AC
  Administered 2019-04-20: 0.5 mL via INTRAMUSCULAR

## 2019-04-20 NOTE — ED Provider Notes (Signed)
[facility]    CSN: [id number] Arrival date & time: 04/20/19  1438     History   Chief Complaint Chief Complaint  Patient presents with  . Animal Bite    HPI Erin Camacho is a 73 y.o. female.   73 yo female with a c/o dog bite to her right forearm and hand. Patient states she was bitten by her own dog (a 19 year old small Havanese) when she tried to pick him up this morning. Patient states that her dog was up to date on his vaccines, including rabies. Patient states her husband took the dog to the vet to be put down and animal control was notified.   Patient's last tetanus vaccine was about 7 years ago.    Animal Bite    Past Medical History:  Diagnosis Date  . Allergy    hay fever  . Anxiety   . Arthritis   . Chronic back pain   . Diabetes mellitus    no meds      pt states she is not diabetic  . Dyspnea   . GERD (gastroesophageal reflux disease)   . History of hiatal hernia   . Hyperlipidemia   . Hypertension   . Pneumonia   . Spinal stenosis   . UTI (lower urinary tract infection)     Patient Active Problem List   Diagnosis Date Noted  . Spinal stenosis 09/15/2018  . Lumbar stenosis with neurogenic claudication 09/15/2018  . Shortness of breath 06/19/2018  . Cough 04/13/2018  . Aortic atherosclerosis ([place]) 01/11/2018  . GERD (gastroesophageal reflux disease) 12/23/2016  . Atrophic vaginitis 03/18/2016  . Recurrent UTI 02/20/2016  . Spondylolisthesis at L5-S1 level 12/06/2015  . Preop examination 11/22/2015  . Rhinitis, allergic 11/22/2015  . Right leg pain 07/27/2014  . 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 Bilateral shoulder pain 02/24/2014  . Skin lesion 01/18/2014  . History of bariatric surgery 01/18/2014  . Back pain 05/25/2013  . Mild depression ([place]) 05/14/2013  . Major depressive disorder with single episode 05/14/2013  . Status post lumbar spine operation 04/17/2013  . Healthcare  maintenance 01/07/2013  . Hypercholesterolemia 08/22/2012  . HLD (hyperlipidemia) 08/22/2012  . Hypertension 07/07/2012  . Diabetes mellitus type 2, diet-controlled ([place]) 07/07/2012  . Insomnia 07/07/2012  . Obesity (BMI 30-39.9) 07/07/2012  . Essential (primary) hypertension 07/07/2012    Past Surgical History:  Procedure Laterality Date  . ABDOMINAL HYSTERECTOMY  1973  . ABDOMINAL HYSTERECTOMY    . APPENDECTOMY    . APPLICATION OF ROBOTIC ASSISTANCE FOR SPINAL PROCEDURE N/A 09/15/2018   Procedure: APPLICATION OF ROBOTIC ASSISTANCE FOR SPINAL PROCEDURE;  Surgeon: Kristeen Miss, MD;  Location: [facility];  Service: Neurosurgery;  Laterality: N/A;  . BACK SURGERY  04/17/13, 2017  . CHOLECYSTECTOMY  1995  . HERNIA REPAIR  08/2006  . lap band  08/2003  . SEPTOPLASTY  04/1978  . TONSILLECTOMY AND ADENOIDECTOMY  1979  . TOTAL HIP ARTHROPLASTY  02/2012   Right, Dr. Marry Guan    OB History   No obstetric history on file.      Home Medications    Prior to Admission medications   Medication Sig Start Date End Date Taking? Authorizing Provider  ALPRAZolam (XANAX) 0.25 MG tablet Take 1 tablet (0.25 mg total) by mouth daily as needed for anxiety. 02/13/19  Yes [name], MD  Cholecalciferol (VITAMIN D3) 2000 units TABS Take 2,000 Units by mouth daily.  Yes [provider]  fexofenadine (ALLEGRA) 180 MG tablet Take 1 tablet (180 mg total) by mouth daily. 01/18/14  Yes Jackolyn Confer, MD  fluticasone (FLONASE) 50 MCG/ACT nasal spray USE 2 SPRAYS IN EACH NOSTRIL EVERY DAY 04/02/19  Yes Crecencio Mc, MD  Lidocaine HCl (ASPERCREME LIDOCAINE) 4 % LIQD Apply 1 application topically as needed (pain).   Yes [provider]  Liniments (SALONPAS EX) Apply 1 application topically daily.   Yes [provider]  losartan-hydrochlorothiazide (HYZAAR) 50-12.5 MG tablet TAKE 1 TABLET BY MOUTH EVERY DAY 01/01/19  Yes [name], MD  metFORMIN (GLUCOPHAGE) 500 MG tablet  TAKE 1 TABLET BY MOUTH EVERY DAY 11/24/18  Yes [name], MD  metoprolol succinate (TOPROL-XL) 25 MG 24 hr tablet TAKE ONE 1/2 TABLET PER DAY. 04/02/19  Yes [name], MD  Multiple Vitamin (MULTIVITAMIN) tablet Take 1 tablet by mouth daily.   Yes [provider]  sertraline (ZOLOFT) 100 MG tablet TAKE 1/2 TABLET BY MOUTH (50 MG) BY MOUTH DAILY Patient taking differently: Take 50 mg by mouth daily.  08/05/18  Yes [name], MD  simvastatin (ZOCOR) 10 MG tablet TAKE ONE-HALF TABLET (5 MG TOTAL) BY MOUTH AT BEDTIME. Patient taking differently: Take 5 mg by mouth at bedtime.  05/06/18  Yes [name], MD  tiZANidine (ZANAFLEX) 2 MG tablet Take 1 tablet (2 mg total) by mouth every 8 (eight) hours as needed for muscle spasms. 10/04/18  Yes Amin, Ankit Chirag, MD  traMADol (ULTRAM) 50 MG tablet 50 mg as needed.  10/15/18  Yes [provider]  traZODone (DESYREL) 100 MG tablet TAKE 1 TABLET BY MOUTH EVERYDAY AT BEDTIME 03/19/19  Yes [name], MD  amoxicillin-clavulanate (AUGMENTIN) 875-125 MG tablet Take 1 tablet by mouth 2 (two) times daily. 04/20/19   Norval Gable, MD  pantoprazole (PROTONIX) 40 MG tablet TAKE 1 TABLET (40 MG TOTAL) BY MOUTH 2 (TWO) TIMES DAILY BEFORE A MEAL. 03/09/19   [name], MD    Family History Family History  Problem Relation Age of Onset  . Hypertension Mother   . Stroke Mother   . Heart disease Mother   . Hypertension Father   . Stroke Father   . Heart disease Father   . Arthritis Sister   . Diabetes Sister   . Dementia Sister   . Cancer Sister   . Diabetes Brother   . Hypertension Brother   . Diabetes Brother   . Hypertension Brother   . Breast cancer Other 47  . Cancer Other        breast  . Diabetes Other   . Kidney disease Neg Hx   . Bladder Cancer Neg Hx     Social History Social History   Tobacco Use  . Smoking status: Never Smoker  . Smokeless tobacco: Never Used  Substance Use Topics  . Alcohol  use: Yes    Alcohol/week: 0.0 - 1.0 standard drinks    Comment: occ  . Drug use: No     Allergies   Adhesive [tape]   Review of Systems Review of Systems   Physical Exam Triage Vital Signs ED Triage Vitals  Enc Vitals Group     BP 04/20/19 1451 (!) 146/90     Pulse Rate 04/20/19 1451 73     Resp 04/20/19 1451 18     Temp 04/20/19 1451 98.4 F (36.9 C)     Temp Source 04/20/19 1451 Oral     SpO2 04/20/19 1451 99 %  Weight 04/20/19 1449 190 lb (86.2 kg)     Height 04/20/19 1449 5\' 5"  (1.651 m)     Head Circumference --      Peak Flow --      Pain Score 04/20/19 1449 6     Pain Loc --      Pain Edu? --      Excl. in [place]? --    No data found.  Updated Vital Signs BP (!) 146/90 (BP Location: Left Arm)   Pulse 73   Temp 98.4 F (36.9 C) (Oral)   Resp 18   Ht 5\' 5"  (1.651 m)   Wt 86.3 kg   SpO2 99%   BMI 31.67 kg/m   Visual Acuity Right Eye Distance:   Left Eye Distance:   Bilateral Distance:    Right Eye Near:   Left Eye Near:    Bilateral Near:     Physical Exam Vitals signs and nursing note reviewed.  Constitutional:      General: She is not in acute distress.    Appearance: She is not toxic-appearing or diaphoretic.  Musculoskeletal:     Right forearm: She exhibits tenderness and swelling.     Comments: Right upper extremity/hand neurovascularly intact; multiple superficial puncture wounds approx 3-51mm on forearm area and hand; normal range of motion; no active bleeding  Neurological:     Mental Status: She is alert.      UC Treatments / Results  Labs (all labs ordered are listed, but only abnormal results are displayed) Labs Reviewed - No data to display  EKG None  Radiology No results found.  Procedures Procedures (including critical care time)  Medications Ordered in UC Medications  Tdap (BOOSTRIX) injection 0.5 mL (0.5 mLs Intramuscular Given 04/20/19 1520)    Initial Impression / Assessment and Plan / UC Course  I have  reviewed the triage vital signs and the nursing notes.  Pertinent labs & imaging results that were available during my care of the patient were reviewed by me and considered in my medical decision making (see chart for details).      Final Clinical Impressions(s) / UC Diagnoses   Final diagnoses:  Dog bite of right upper extremity, initial encounter     Discharge Instructions     Keep wounds clean Finish all the antibiotic course Monitor for worsening signs or symptoms and follow up if those develop    ED Prescriptions    Medication Sig Dispense Auth. Provider   amoxicillin-clavulanate (AUGMENTIN) 875-125 MG tablet Take 1 tablet by mouth 2 (two) times daily. 20 tablet Norval Gable, MD      1. diagnosis reviewed with patient; Tdap given 2. rx as per orders above; reviewed possible side effects, interactions, risks and benefits  3. Recommend supportive treatment with routine wound care; otc analgesics prn; monitoring for any developing signs/symptoms of infection 4. Follow-up prn if symptoms worsen or don't improve   Controlled Substance Prescriptions [place] Controlled Substance Registry consulted? Not Applicable   Norval Gable, MD 04/20/19 [phone]

## 2019-04-20 NOTE — ED Triage Notes (Signed)
Patient complains of dog bite that occurred this morning by her own dog. Patient states that something was wrong with her dog and he bit her 4 times on her right forearm, wrist, hand. Patient states that she had her dog put down after this occurred.

## 2019-04-20 NOTE — Discharge Instructions (Signed)
Keep wounds clean Finish all the antibiotic course Monitor for worsening signs or symptoms and follow up if those develop

## 2019-04-20 NOTE — Telephone Encounter (Signed)
Patient hung up attempted to call back twice.No answer

## 2019-04-20 NOTE — Telephone Encounter (Signed)
Patient bit by her pet dog.  Hand is now swelling, still bleeding.  Injury to right hand and thumb.  Dog has been put down (previous illness).  Patient requested an OV for the swelling.  Call transferred to office. Answer Assessment - Initial Assessment Questions 1. ANIMAL: "What type of animal caused the bite?" "Is the injury from a bite or a claw?" If the animal is a dog or a cat, ask: "Was it a pet or a stray?" "Was it acting ill or behaving strangely?"     Small dog, her pet, dog has been put down 2. LOCATION: "Where is the bite located?"     Right hand (thumb, hand) 3. SIZE: "How big is the bite?" "What does it look like?"    Large bites, puncture marks, bleeding 4. ONSET: "When did the bite happen?" (Minutes or hours ago)      Around 9:30 5. CIRCUMSTANCES: "Tell me how this happened."     Went to pick up her dog.  6. TETANUS: "When was the last tetanus booster?"    Tetanus shot in 2013  Protocols used: ANIMAL BITE-A-AH

## 2019-04-20 NOTE — Telephone Encounter (Signed)
Patient scheduled for 320 today for dog bite, we were unable to reach her to do the doxy    I can now see she is at [facility] -- did not call to tell us she would not be doing her appt with [place]  Not sure what to do  Gae Bon scheduled the appt  Thanks LG

## 2019-04-21 ENCOUNTER — Ambulatory Visit: Payer: PPO | Admitting: Family Medicine

## 2019-04-21 DIAGNOSIS — J301 Allergic rhinitis due to pollen: Secondary | ICD-10-CM | POA: Diagnosis not present

## 2019-04-21 NOTE — Telephone Encounter (Signed)
Copied from [place] [id number]. Topic: Quick Communication - Appointment Cancellation >> Apr 20, 2019  4:09 PM Nils Flack wrote: Patient called to cancel appointment scheduled for 04/20/19. Patient has not rescheduled their appointment. [facility]  There was confusion on appt details, and pt thought it was 06/02  Route to department's PEC pool.

## 2019-04-27 ENCOUNTER — Other Ambulatory Visit: Payer: Self-pay | Admitting: Internal Medicine

## 2019-04-28 ENCOUNTER — Other Ambulatory Visit: Payer: Self-pay

## 2019-04-28 DIAGNOSIS — J301 Allergic rhinitis due to pollen: Secondary | ICD-10-CM | POA: Diagnosis not present

## 2019-04-28 NOTE — Telephone Encounter (Signed)
Pt stated that she is only taking 1/2 tablet but she gets the 100mg  tablets and breaks them in half

## 2019-04-28 NOTE — Telephone Encounter (Signed)
rx ok'd for zoloft.

## 2019-04-28 NOTE — Telephone Encounter (Signed)
Please confirm with pt dose she is taking.

## 2019-04-29 ENCOUNTER — Other Ambulatory Visit: Payer: Self-pay | Admitting: Internal Medicine

## 2019-05-05 DIAGNOSIS — J301 Allergic rhinitis due to pollen: Secondary | ICD-10-CM | POA: Diagnosis not present

## 2019-05-12 DIAGNOSIS — J301 Allergic rhinitis due to pollen: Secondary | ICD-10-CM | POA: Diagnosis not present

## 2019-05-19 DIAGNOSIS — J301 Allergic rhinitis due to pollen: Secondary | ICD-10-CM | POA: Diagnosis not present

## 2019-05-26 DIAGNOSIS — J301 Allergic rhinitis due to pollen: Secondary | ICD-10-CM | POA: Diagnosis not present

## 2019-05-27 ENCOUNTER — Other Ambulatory Visit: Payer: Self-pay | Admitting: Internal Medicine

## 2019-06-02 DIAGNOSIS — J301 Allergic rhinitis due to pollen: Secondary | ICD-10-CM | POA: Diagnosis not present

## 2019-06-09 DIAGNOSIS — J301 Allergic rhinitis due to pollen: Secondary | ICD-10-CM | POA: Diagnosis not present

## 2019-06-10 DIAGNOSIS — J301 Allergic rhinitis due to pollen: Secondary | ICD-10-CM | POA: Diagnosis not present

## 2019-06-15 DIAGNOSIS — L578 Other skin changes due to chronic exposure to nonionizing radiation: Secondary | ICD-10-CM | POA: Diagnosis not present

## 2019-06-15 DIAGNOSIS — Z872 Personal history of diseases of the skin and subcutaneous tissue: Secondary | ICD-10-CM | POA: Diagnosis not present

## 2019-06-15 DIAGNOSIS — L570 Actinic keratosis: Secondary | ICD-10-CM | POA: Diagnosis not present

## 2019-06-15 DIAGNOSIS — L821 Other seborrheic keratosis: Secondary | ICD-10-CM | POA: Diagnosis not present

## 2019-06-16 DIAGNOSIS — J301 Allergic rhinitis due to pollen: Secondary | ICD-10-CM | POA: Diagnosis not present

## 2019-06-17 ENCOUNTER — Other Ambulatory Visit: Payer: Self-pay | Admitting: Internal Medicine

## 2019-06-23 DIAGNOSIS — J301 Allergic rhinitis due to pollen: Secondary | ICD-10-CM | POA: Diagnosis not present

## 2019-06-26 ENCOUNTER — Other Ambulatory Visit: Payer: Self-pay | Admitting: Internal Medicine

## 2019-06-29 ENCOUNTER — Other Ambulatory Visit: Payer: Self-pay

## 2019-06-30 ENCOUNTER — Other Ambulatory Visit: Payer: Self-pay

## 2019-06-30 ENCOUNTER — Ambulatory Visit (INDEPENDENT_AMBULATORY_CARE_PROVIDER_SITE_OTHER): Payer: PPO | Admitting: Internal Medicine

## 2019-06-30 DIAGNOSIS — J301 Allergic rhinitis due to pollen: Secondary | ICD-10-CM | POA: Diagnosis not present

## 2019-06-30 DIAGNOSIS — E7800 Pure hypercholesterolemia, unspecified: Secondary | ICD-10-CM

## 2019-06-30 DIAGNOSIS — M545 Low back pain, unspecified: Secondary | ICD-10-CM

## 2019-06-30 DIAGNOSIS — I700 Atherosclerosis of aorta: Secondary | ICD-10-CM

## 2019-06-30 DIAGNOSIS — I10 Essential (primary) hypertension: Secondary | ICD-10-CM | POA: Diagnosis not present

## 2019-06-30 DIAGNOSIS — E119 Type 2 diabetes mellitus without complications: Secondary | ICD-10-CM | POA: Diagnosis not present

## 2019-06-30 DIAGNOSIS — F320 Major depressive disorder, single episode, mild: Secondary | ICD-10-CM

## 2019-06-30 DIAGNOSIS — K219 Gastro-esophageal reflux disease without esophagitis: Secondary | ICD-10-CM

## 2019-06-30 DIAGNOSIS — F32A Depression, unspecified: Secondary | ICD-10-CM

## 2019-06-30 DIAGNOSIS — R6881 Early satiety: Secondary | ICD-10-CM | POA: Diagnosis not present

## 2019-06-30 DIAGNOSIS — E669 Obesity, unspecified: Secondary | ICD-10-CM | POA: Diagnosis not present

## 2019-06-30 DIAGNOSIS — Z9884 Bariatric surgery status: Secondary | ICD-10-CM

## 2019-06-30 NOTE — Progress Notes (Signed)
Patient ID: Erin Camacho, female   DOB: Feb 16, 1946, 73 y.o.   MRN: [id number]   Subjective:    Patient ID: Erin Camacho, female    DOB: 06/09/1946, 73 y.o.   MRN: [id number]  HPI  Patient here for a scheduled follow up.  She reports she is doing relatively well.  Still having increased back pain.  Followed by her surgeon.  Takes tramadol. No chest pain.  Breathing stable.  Reports sugars doing well.  She has had issues with feeling full fast.  Has lost weight.  Some trouble at times with food going down.  She is s/p lap band.  Discussed the need for further GI evaluation given symptoms.  She is in agreement.  No abdominal pain.  Bowels moving.   Blood pressure doing well.     Past Medical History:  Diagnosis Date  . Allergy    hay fever  . Anxiety   . Arthritis   . Chronic back pain   . Diabetes mellitus    no meds      pt states she is not diabetic  . Dyspnea   . GERD (gastroesophageal reflux disease)   . History of hiatal hernia   . Hyperlipidemia   . Hypertension   . Pneumonia   . Spinal stenosis   . UTI (lower urinary tract infection)    Past Surgical History:  Procedure Laterality Date  . ABDOMINAL HYSTERECTOMY  1973  . ABDOMINAL HYSTERECTOMY    . APPENDECTOMY    . APPLICATION OF ROBOTIC ASSISTANCE FOR SPINAL PROCEDURE N/A 09/15/2018   Procedure: APPLICATION OF ROBOTIC ASSISTANCE FOR SPINAL PROCEDURE;  Surgeon: Kristeen Miss, MD;  Location: [place];  Service: Neurosurgery;  Laterality: N/A;  . BACK SURGERY  04/17/13, 2017  . CHOLECYSTECTOMY  1995  . HERNIA REPAIR  08/2006  . lap band  08/2003  . SEPTOPLASTY  04/1978  . TONSILLECTOMY AND ADENOIDECTOMY  1979  . TOTAL HIP ARTHROPLASTY  02/2012   Right, Dr. Marry Guan   Family History  Problem Relation Age of Onset  . Hypertension Mother   . Stroke Mother   . Heart disease Mother   . Hypertension Father   . Stroke Father   . Heart disease Father   . Arthritis Sister   . Diabetes Sister   . Dementia Sister   . Cancer  Sister   . Diabetes Brother   . Hypertension Brother   . Diabetes Brother   . Hypertension Brother   . Breast cancer Other 47  . Cancer Other        breast  . Diabetes Other   . Kidney disease Neg Hx   . Bladder Cancer Neg Hx    Social History   Socioeconomic History  . Marital status: Married    Spouse name: Not on file  . Number of children: Not on file  . Years of education: Not on file  . Highest education level: Not on file  Occupational History  . Not on file  Social Needs  . Financial resource strain: Not hard at all  . Food insecurity    Worry: Never true    Inability: Never true  . Transportation needs    Medical: No    Non-medical: No  Tobacco Use  . Smoking status: Never Smoker  . Smokeless tobacco: Never Used  Substance and Sexual Activity  . Alcohol use: Yes    Alcohol/week: 0.0 - 1.0 standard drinks    Comment: occ  . Drug  use: No  . Sexual activity: Never  Lifestyle  . Physical activity    Days per week: 0 days    Minutes per session: Not on file  . Stress: Only a little  Relationships  . Social Herbalist on phone: Not on file    Gets together: Not on file    Attends religious service: Not on file    Active member of club or organization: Not on file    Attends meetings of clubs or organizations: Not on file    Relationship status: Married  Other Topics Concern  . Not on file  Social History Narrative   Lives in [place] with husband.      Work - retired Sports administrator    Outpatient Encounter Medications as of 06/30/2019  Medication Sig  . ALPRAZolam (XANAX) 0.25 MG tablet Take 1 tablet (0.25 mg total) by mouth daily as needed for anxiety.  . Cholecalciferol (VITAMIN D3) 2000 units TABS Take 2,000 Units by mouth daily.  . fexofenadine (ALLEGRA) 180 MG tablet Take 1 tablet (180 mg total) by mouth daily.  . fluticasone (FLONASE) 50 MCG/ACT nasal spray SPRAY 2 SPRAYS INTO EACH NOSTRIL EVERY DAY  . Lidocaine HCl (ASPERCREME  LIDOCAINE) 4 % LIQD Apply 1 application topically as needed (pain).  . Liniments (SALONPAS EX) Apply 1 application topically daily.  Marland Kitchen losartan-hydrochlorothiazide (HYZAAR) 50-12.5 MG tablet TAKE 1 TABLET BY MOUTH EVERY DAY  . metFORMIN (GLUCOPHAGE) 500 MG tablet TAKE 1 TABLET BY MOUTH EVERY DAY  . metoprolol succinate (TOPROL-XL) 25 MG 24 hr tablet TAKE ONE 1/2 TABLET PER DAY.  . Multiple Vitamin (MULTIVITAMIN) tablet Take 1 tablet by mouth daily.  . pantoprazole (PROTONIX) 40 MG tablet TAKE 1 TABLET (40 MG TOTAL) BY MOUTH 2 (TWO) TIMES DAILY BEFORE A MEAL.  Marland Kitchen sertraline (ZOLOFT) 100 MG tablet TAKE 1/2 TABLET BY MOUTH (50 MG) BY MOUTH DAILY  . simvastatin (ZOCOR) 10 MG tablet TAKE ONE-HALF TABLET (5 MG TOTAL) BY MOUTH AT BEDTIME.  Marland Kitchen tiZANidine (ZANAFLEX) 2 MG tablet Take 1 tablet (2 mg total) by mouth every 8 (eight) hours as needed for muscle spasms.  . traMADol (ULTRAM) 50 MG tablet 50 mg as needed.   . traZODone (DESYREL) 100 MG tablet TAKE 1 TABLET BY MOUTH EVERYDAY AT BEDTIME  . [DISCONTINUED] amoxicillin-clavulanate (AUGMENTIN) 875-125 MG tablet Take 1 tablet by mouth 2 (two) times daily.   No facility-administered encounter medications on file as of 06/30/2019.     Review of Systems  Constitutional:       Weight loss.  Feels full fast.   HENT: Negative for congestion and sinus pressure.   Respiratory: Negative for cough, chest tightness and shortness of breath.   Cardiovascular: Negative for chest pain, palpitations and leg swelling.  Gastrointestinal: Negative for abdominal pain, diarrhea, nausea and vomiting.  Genitourinary: Negative for difficulty urinating and dysuria.  Musculoskeletal: Positive for back pain. Negative for joint swelling and myalgias.  Skin: Negative for color change and rash.  Neurological: Negative for dizziness, light-headedness and headaches.  Psychiatric/Behavioral: Negative for agitation and dysphoric mood.       Objective:    Physical Exam  Constitutional:      General: She is not in acute distress.    Appearance: Normal appearance.  HENT:     Right Ear: External ear normal.     Left Ear: External ear normal.  Eyes:     General: No scleral icterus.  Right eye: No discharge.        Left eye: No discharge.     Conjunctiva/sclera: Conjunctivae normal.  Neck:     Musculoskeletal: Neck supple. No muscular tenderness.     Thyroid: No thyromegaly.  Cardiovascular:     Rate and Rhythm: Normal rate and regular rhythm.  Pulmonary:     Effort: No respiratory distress.     Breath sounds: Normal breath sounds. No wheezing.  Abdominal:     General: Bowel sounds are normal.     Palpations: Abdomen is soft.     Tenderness: There is no abdominal tenderness.  Musculoskeletal:        General: No swelling or tenderness.  Lymphadenopathy:     Cervical: No cervical adenopathy.  Skin:    Findings: No erythema or rash.  Neurological:     Mental Status: She is alert.  Psychiatric:        Mood and Affect: Mood normal.        Behavior: Behavior normal.     BP 140/82   Pulse (!) 56   Temp 98.1 F (36.7 C) (Oral)   Resp 16   Wt 191 lb (86.6 kg)   SpO2 99%   BMI 31.78 kg/m  Wt Readings from Last 3 Encounters:  06/30/19 191 lb (86.6 kg)  04/20/19 190 lb 4.8 oz (86.3 kg)  02/13/19 202 lb (91.6 kg)     Lab Results  Component Value Date   WBC 8.6 02/13/2019   HGB 12.6 02/13/2019   HCT 37.7 02/13/2019   PLT 251 02/13/2019   GLUCOSE 123 (H) 02/13/2019   CHOL 172 02/13/2019   TRIG 66 02/13/2019   HDL 72 02/13/2019   LDLDIRECT 146.5 09/19/2012   LDLCALC 85 02/13/2019   ALT 11 02/13/2019   AST 17 02/13/2019   NA 139 02/13/2019   K 3.7 02/13/2019   CL 103 02/13/2019   CREATININE 0.87 02/13/2019   BUN 18 02/13/2019   CO2 23 02/13/2019   TSH 0.55 02/13/2019   INR 1.0 11/22/2015   HGBA1C 6.1 (H) 02/13/2019   MICROALBUR 0.2 02/13/2019       Assessment & Plan:   Problem List Items Addressed This Visit     Aortic atherosclerosis ([place])    On simvastatin.       Back pain    S/p lumbar decompression and extension of her prior fusion 09/15/18.  Followed by Dr Ellene Route.  Continue f/u with ortho.        Diabetes mellitus type 2, diet-controlled ([facility])    Low carb diet and exercise.  Follow met b and a1c.  States sugars are doing well.        Relevant Orders   Hemoglobin A1c   Early satiety    Early satiety.  Some trouble swallowing.  History of bariatric surgery. Weight loss.  Refer to GI for further evaluation and question of need for EGD.       Relevant Orders   Ambulatory referral to Gastroenterology   Essential (primary) hypertension    Blood pressure on recheck improved.  Continue current medication regimen. Follow pressures.  Follow metabolic panel.       Relevant Orders   Basic metabolic panel   GERD (gastroesophageal reflux disease)    Controlled.        History of bariatric surgery    S/p lap band procedure at [facility].  Has noticed some decreased appetite and early satiety.  Some issues with swallowing at times.  Refer  to GI for further evaluation and question of need for EGD.        Relevant Orders   Ambulatory referral to Gastroenterology   Hypercholesterolemia    On simvastatin.  Low cholesterol diet and exercise.  Follow lipid panel and liver function tests.        Relevant Orders   Lipid panel   Hepatic function panel   Mild depression ([facility])    Stable.  Follow.        Obesity (BMI 30-39.9)    Discussed diet and exercise.  Follow.           [name], MD

## 2019-07-04 ENCOUNTER — Other Ambulatory Visit: Payer: Self-pay | Admitting: Internal Medicine

## 2019-07-05 ENCOUNTER — Encounter: Payer: Self-pay | Admitting: Internal Medicine

## 2019-07-05 DIAGNOSIS — R6881 Early satiety: Secondary | ICD-10-CM | POA: Insufficient documentation

## 2019-07-05 NOTE — Assessment & Plan Note (Signed)
On simvastatin.   

## 2019-07-05 NOTE — Assessment & Plan Note (Signed)
S/p lumbar decompression and extension of her prior fusion 09/15/18.  Followed by Dr Ellene Route.  Continue f/u with ortho.

## 2019-07-05 NOTE — Assessment & Plan Note (Signed)
Discussed diet and exercise.  Follow.  

## 2019-07-05 NOTE — Assessment & Plan Note (Signed)
Blood pressure on recheck improved.  Continue current medication regimen.  Follow pressures.  Follow metabolic panel.   

## 2019-07-05 NOTE — Assessment & Plan Note (Signed)
Stable.  Follow.   

## 2019-07-05 NOTE — Assessment & Plan Note (Signed)
Low carb diet and exercise.  Follow met b and a1c.  States sugars are doing well.

## 2019-07-05 NOTE — Assessment & Plan Note (Signed)
Early satiety.  Some trouble swallowing.  History of bariatric surgery. Weight loss.  Refer to GI for further evaluation and question of need for EGD.

## 2019-07-05 NOTE — Assessment & Plan Note (Signed)
Controlled.  

## 2019-07-05 NOTE — Assessment & Plan Note (Signed)
On simvastatin.  Low cholesterol diet and exercise.  Follow lipid panel and liver function tests.   

## 2019-07-05 NOTE — Assessment & Plan Note (Signed)
S/p lap band procedure at [facility].  Has noticed some decreased appetite and early satiety.  Some issues with swallowing at times.  Refer to GI for further evaluation and question of need for EGD.

## 2019-07-07 ENCOUNTER — Other Ambulatory Visit: Payer: Self-pay | Admitting: Internal Medicine

## 2019-07-14 ENCOUNTER — Other Ambulatory Visit (INDEPENDENT_AMBULATORY_CARE_PROVIDER_SITE_OTHER): Payer: PPO

## 2019-07-14 ENCOUNTER — Other Ambulatory Visit: Payer: Self-pay

## 2019-07-14 DIAGNOSIS — E119 Type 2 diabetes mellitus without complications: Secondary | ICD-10-CM | POA: Diagnosis not present

## 2019-07-14 DIAGNOSIS — E7800 Pure hypercholesterolemia, unspecified: Secondary | ICD-10-CM

## 2019-07-14 DIAGNOSIS — I10 Essential (primary) hypertension: Secondary | ICD-10-CM

## 2019-07-14 DIAGNOSIS — J301 Allergic rhinitis due to pollen: Secondary | ICD-10-CM | POA: Diagnosis not present

## 2019-07-14 LAB — HEPATIC FUNCTION PANEL
ALT: 9 U/L (ref 0–35)
AST: 16 U/L (ref 0–37)
Albumin: 4.6 g/dL (ref 3.5–5.2)
Alkaline Phosphatase: 75 U/L (ref 39–117)
Bilirubin, Direct: 0.1 mg/dL (ref 0.0–0.3)
Total Bilirubin: 0.7 mg/dL (ref 0.2–1.2)
Total Protein: 6.6 g/dL (ref 6.0–8.3)

## 2019-07-14 LAB — LIPID PANEL
Cholesterol: 163 mg/dL (ref 0–200)
HDL: 55.6 mg/dL
LDL Cholesterol: 82 mg/dL (ref 0–99)
NonHDL: 107.21
Total CHOL/HDL Ratio: 3
Triglycerides: 125 mg/dL (ref 0.0–149.0)
VLDL: 25 mg/dL (ref 0.0–40.0)

## 2019-07-14 LAB — HEMOGLOBIN A1C: Hgb A1c MFr Bld: 5.9 % (ref 4.6–6.5)

## 2019-07-14 LAB — BASIC METABOLIC PANEL WITH GFR
BUN: 18 mg/dL (ref 6–23)
CO2: 28 meq/L (ref 19–32)
Calcium: 10 mg/dL (ref 8.4–10.5)
Chloride: 104 meq/L (ref 96–112)
Creatinine, Ser: 0.88 mg/dL (ref 0.40–1.20)
GFR: 62.78 mL/min
Glucose, Bld: 122 mg/dL — ABNORMAL HIGH (ref 70–99)
Potassium: 3.7 meq/L (ref 3.5–5.1)
Sodium: 141 meq/L (ref 135–145)

## 2019-07-16 ENCOUNTER — Encounter: Payer: Self-pay | Admitting: Internal Medicine

## 2019-07-21 DIAGNOSIS — J301 Allergic rhinitis due to pollen: Secondary | ICD-10-CM | POA: Diagnosis not present

## 2019-07-28 ENCOUNTER — Other Ambulatory Visit (HOSPITAL_COMMUNITY): Payer: Self-pay | Admitting: Student

## 2019-07-28 ENCOUNTER — Other Ambulatory Visit: Payer: Self-pay | Admitting: Student

## 2019-07-28 DIAGNOSIS — R1110 Vomiting, unspecified: Secondary | ICD-10-CM | POA: Diagnosis not present

## 2019-07-28 DIAGNOSIS — R1319 Other dysphagia: Secondary | ICD-10-CM

## 2019-07-28 DIAGNOSIS — R1310 Dysphagia, unspecified: Secondary | ICD-10-CM

## 2019-07-28 DIAGNOSIS — K219 Gastro-esophageal reflux disease without esophagitis: Secondary | ICD-10-CM | POA: Diagnosis not present

## 2019-07-28 DIAGNOSIS — J301 Allergic rhinitis due to pollen: Secondary | ICD-10-CM | POA: Diagnosis not present

## 2019-07-29 ENCOUNTER — Other Ambulatory Visit: Payer: Self-pay

## 2019-07-29 ENCOUNTER — Ambulatory Visit
Admission: RE | Admit: 2019-07-29 | Discharge: 2019-07-29 | Disposition: A | Discharge status: Home / Self Care | Payer: PPO | Source: Ambulatory Visit | Attending: Student | Admitting: Student

## 2019-07-29 DIAGNOSIS — R1310 Dysphagia, unspecified: Secondary | ICD-10-CM | POA: Insufficient documentation

## 2019-07-29 DIAGNOSIS — R1110 Vomiting, unspecified: Secondary | ICD-10-CM | POA: Diagnosis present

## 2019-07-29 DIAGNOSIS — K219 Gastro-esophageal reflux disease without esophagitis: Secondary | ICD-10-CM | POA: Diagnosis not present

## 2019-07-29 DIAGNOSIS — R1319 Other dysphagia: Secondary | ICD-10-CM

## 2019-08-04 DIAGNOSIS — J301 Allergic rhinitis due to pollen: Secondary | ICD-10-CM | POA: Diagnosis not present

## 2019-08-05 ENCOUNTER — Other Ambulatory Visit: Payer: Self-pay | Admitting: Internal Medicine

## 2019-08-18 DIAGNOSIS — J301 Allergic rhinitis due to pollen: Secondary | ICD-10-CM | POA: Diagnosis not present

## 2019-08-21 ENCOUNTER — Encounter
Admission: RE | Admit: 2019-08-21 | Discharge: 2019-08-21 | Disposition: A | Discharge status: Home / Self Care | Payer: PPO | Source: Ambulatory Visit | Attending: Orthopedic Surgery | Admitting: Orthopedic Surgery

## 2019-08-21 ENCOUNTER — Other Ambulatory Visit: Payer: Self-pay

## 2019-08-21 DIAGNOSIS — Z79899 Other long term (current) drug therapy: Secondary | ICD-10-CM | POA: Diagnosis not present

## 2019-08-21 DIAGNOSIS — M25552 Pain in left hip: Secondary | ICD-10-CM | POA: Diagnosis not present

## 2019-08-21 DIAGNOSIS — Z7984 Long term (current) use of oral hypoglycemic drugs: Secondary | ICD-10-CM | POA: Insufficient documentation

## 2019-08-21 DIAGNOSIS — I10 Essential (primary) hypertension: Secondary | ICD-10-CM | POA: Diagnosis not present

## 2019-08-21 DIAGNOSIS — E119 Type 2 diabetes mellitus without complications: Secondary | ICD-10-CM | POA: Insufficient documentation

## 2019-08-21 DIAGNOSIS — E785 Hyperlipidemia, unspecified: Secondary | ICD-10-CM | POA: Insufficient documentation

## 2019-08-21 DIAGNOSIS — G4730 Sleep apnea, unspecified: Secondary | ICD-10-CM | POA: Diagnosis not present

## 2019-08-21 DIAGNOSIS — Z01810 Encounter for preprocedural cardiovascular examination: Secondary | ICD-10-CM | POA: Diagnosis not present

## 2019-08-21 DIAGNOSIS — I700 Atherosclerosis of aorta: Secondary | ICD-10-CM | POA: Insufficient documentation

## 2019-08-21 DIAGNOSIS — K219 Gastro-esophageal reflux disease without esophagitis: Secondary | ICD-10-CM | POA: Insufficient documentation

## 2019-08-21 DIAGNOSIS — M1612 Unilateral primary osteoarthritis, left hip: Secondary | ICD-10-CM | POA: Insufficient documentation

## 2019-08-21 DIAGNOSIS — Z01818 Encounter for other preprocedural examination: Secondary | ICD-10-CM | POA: Diagnosis present

## 2019-08-21 LAB — SURGICAL PCR SCREEN
MRSA, PCR: NEGATIVE
Staphylococcus aureus: NEGATIVE

## 2019-08-21 LAB — HEMOGLOBIN A1C
Hgb A1c MFr Bld: 5.8 % — ABNORMAL HIGH (ref 4.8–5.6)
Mean Plasma Glucose: 119.76 mg/dL

## 2019-08-21 LAB — COMPREHENSIVE METABOLIC PANEL WITH GFR
ALT: 13 U/L (ref 0–44)
AST: 19 U/L (ref 15–41)
Albumin: 4.2 g/dL (ref 3.5–5.0)
Alkaline Phosphatase: 80 U/L (ref 38–126)
Anion gap: 8 (ref 5–15)
BUN: 20 mg/dL (ref 8–23)
CO2: 28 mmol/L (ref 22–32)
Calcium: 9.7 mg/dL (ref 8.9–10.3)
Chloride: 104 mmol/L (ref 98–111)
Creatinine, Ser: 0.67 mg/dL (ref 0.44–1.00)
GFR calc Af Amer: >60 mL/min
GFR calc non Af Amer: >60 mL/min
Glucose, Bld: 116 mg/dL — ABNORMAL HIGH (ref 70–99)
Potassium: 3.3 mmol/L — ABNORMAL LOW (ref 3.5–5.1)
Sodium: 140 mmol/L (ref 135–145)
Total Bilirubin: 0.7 mg/dL (ref 0.3–1.2)
Total Protein: 7 g/dL (ref 6.5–8.1)

## 2019-08-21 LAB — CBC WITH DIFFERENTIAL/PLATELET
Abs Immature Granulocytes: 0.01 10*3/uL (ref 0.00–0.07)
Basophils Absolute: 0 10*3/uL (ref 0.0–0.1)
Basophils Relative: 1 %
Eosinophils Absolute: 0.2 10*3/uL (ref 0.0–0.5)
Eosinophils Relative: 3 %
HCT: 37 % (ref 36.0–46.0)
Hemoglobin: 12 g/dL (ref 12.0–15.0)
Immature Granulocytes: 0 %
Lymphocytes Relative: 31 %
Lymphs Abs: 1.4 10*3/uL (ref 0.7–4.0)
MCH: 30 pg (ref 26.0–34.0)
MCHC: 32.4 g/dL (ref 30.0–36.0)
MCV: 92.5 fL (ref 80.0–100.0)
Monocytes Absolute: 0.5 10*3/uL (ref 0.1–1.0)
Monocytes Relative: 10 %
Neutro Abs: 2.5 10*3/uL (ref 1.7–7.7)
Neutrophils Relative %: 55 %
Platelets: 190 10*3/uL (ref 150–400)
RBC: 4 MIL/uL (ref 3.87–5.11)
RDW: 14 % (ref 11.5–15.5)
WBC: 4.6 10*3/uL (ref 4.0–10.5)
nRBC: 0 % (ref 0.0–0.2)

## 2019-08-21 LAB — URINALYSIS, ROUTINE W REFLEX MICROSCOPIC
Bilirubin Urine: NEGATIVE
Glucose, UA: NEGATIVE mg/dL
Hgb urine dipstick: NEGATIVE
Ketones, ur: NEGATIVE mg/dL
Nitrite: NEGATIVE
Protein, ur: NEGATIVE mg/dL
Specific Gravity, Urine: 1.024 (ref 1.005–1.030)
pH: 5 (ref 5.0–8.0)

## 2019-08-21 LAB — PROTIME-INR
INR: 1 (ref 0.8–1.2)
Prothrombin Time: 13.2 s (ref 11.4–15.2)

## 2019-08-21 LAB — SEDIMENTATION RATE: Sed Rate: 9 mm/h (ref 0–30)

## 2019-08-21 LAB — TYPE AND SCREEN
ABO/RH(D): O POS
Antibody Screen: NEGATIVE

## 2019-08-21 LAB — APTT: aPTT: 34 s (ref 24–36)

## 2019-08-21 LAB — C-REACTIVE PROTEIN: CRP: <0.8 mg/dL

## 2019-08-21 NOTE — Progress Notes (Signed)
Pre-Admit Testing Provider Notification Note  Provider Notified: Dr. Marry Guan  Notification Mode: Fax  Reason: Abnormal CMP  Response: Fax confirmation received.  Additional Information: Placed on chart.    Signed: Beulah Gandy, RN

## 2019-08-21 NOTE — Patient Instructions (Signed)
Your procedure is scheduled on: Monday 08/31/19.  Report to DAY SURGERY DEPARTMENT LOCATED ON 2ND FLOOR MEDICAL MALL ENTRANCE. To find out your arrival time please call [phone] between 1PM - 3PM on Friday 08/28/19.   Remember: Instructions that are not followed completely may result in serious medical risk, up to and including death, or upon the discretion of your surgeon and anesthesiologist your surgery may need to be rescheduled.      _X__ 1. Do not eat food after midnight the night before your procedure.                 No gum chewing or hard candies. You may drink SUGAR FREE clear liquids up to 2 hours                 before you are scheduled to arrive for your surgery- DO NOT drink                  within 2 hours of the start of your surgery.                  **Dr Marry Guan wants yo to drink the G2 Gatorade on the morning of your surgery. Please finish this 2 hours prior to your arrival time.**   __X__2.  On the morning of surgery brush your teeth with toothpaste and water, you may rinse your mouth with mouthwash if you wish.  Do not swallow any toothpaste or mouthwash.      _X__ 3.  No Alcohol for 24 hours before or after surgery.     __X__4.  Notify your doctor if there is any change in your medical condition      (cold, fever, infections).       Do not wear jewelry, make-up, hairpins, clips or nail polish. Do not wear lotions, powders, or perfumes.  Do not shave 48 hours prior to surgery. Men may shave face and neck. Do not bring valuables to the hospital.     [facility] is not responsible for any belongings or valuables.   Contacts, dentures/partials or body piercings may not be worn into surgery. Bring a case for your contacts, glasses or hearing aids, a denture cup will be supplied.    Patients discharged the day of surgery will not be allowed to drive home.   Please read over the following fact sheets that you were given:   MRSA Information    __X__  Take these medicines the morning of surgery with A SIP OF WATER:     1. fexofenadine (ALLEGRA) 180 MG tablet  2. metoprolol succinate (TOPROL-XL) 25 MG 24 hr tablet  3. pantoprazole (PROTONIX) 40 MG tablet  4. sertraline (ZOLOFT) 100 MG tablet  5. traMADol (ULTRAM) 50 MG tablet  6. tiZANidine (ZANAFLEX) 2 MG tablet  7. ALPRAZolam (XANAX) 0.25 MG tablet if needed     __X__ Use CHG Soap as directed    __X__ Stop Metformin 2 days prior to your surgery. Your last dose will be on Friday 08/28/19.    __X__ Stop Anti-inflammatories 7 days before surgery such as Advil, Ibuprofen, Motrin, BC or Goodies Powder, Naprosyn, Naproxen, Aleve, Aspirin, Meloxicam. May take Tylenol if needed for pain or discomfort.     __X__ Please don't start taking any new herbal supplements before your surgery.

## 2019-08-23 LAB — URINE CULTURE
Culture: 10000 — AB
Special Requests: NORMAL

## 2019-08-24 ENCOUNTER — Telehealth: Payer: Self-pay

## 2019-08-24 ENCOUNTER — Other Ambulatory Visit: Payer: Self-pay

## 2019-08-24 NOTE — Telephone Encounter (Signed)
Copied from [place] [id number]. Topic: General - Other >> Aug 24, 2019  2:24 PM Celene Kras A wrote: Reason for CRM: Dr. Maree Krabbe office called stating the pts potassium was low. They stated they are sending a fax over for PCP to look over it at appt tomorrow. Please advise.

## 2019-08-25 ENCOUNTER — Other Ambulatory Visit: Payer: Self-pay

## 2019-08-25 ENCOUNTER — Ambulatory Visit (INDEPENDENT_AMBULATORY_CARE_PROVIDER_SITE_OTHER): Payer: PPO | Admitting: Internal Medicine

## 2019-08-25 VITALS — BP 116/76 | HR 61 | Temp 97.3°F | Resp 16 | Wt 184.0 lb

## 2019-08-25 DIAGNOSIS — Z23 Encounter for immunization: Secondary | ICD-10-CM | POA: Diagnosis not present

## 2019-08-25 DIAGNOSIS — E7800 Pure hypercholesterolemia, unspecified: Secondary | ICD-10-CM

## 2019-08-25 DIAGNOSIS — E119 Type 2 diabetes mellitus without complications: Secondary | ICD-10-CM | POA: Diagnosis not present

## 2019-08-25 DIAGNOSIS — F320 Major depressive disorder, single episode, mild: Secondary | ICD-10-CM

## 2019-08-25 DIAGNOSIS — Z01818 Encounter for other preprocedural examination: Secondary | ICD-10-CM | POA: Diagnosis not present

## 2019-08-25 DIAGNOSIS — K219 Gastro-esophageal reflux disease without esophagitis: Secondary | ICD-10-CM

## 2019-08-25 DIAGNOSIS — I700 Atherosclerosis of aorta: Secondary | ICD-10-CM | POA: Diagnosis not present

## 2019-08-25 DIAGNOSIS — I10 Essential (primary) hypertension: Secondary | ICD-10-CM | POA: Diagnosis not present

## 2019-08-25 DIAGNOSIS — M545 Low back pain, unspecified: Secondary | ICD-10-CM

## 2019-08-25 DIAGNOSIS — Z1231 Encounter for screening mammogram for malignant neoplasm of breast: Secondary | ICD-10-CM

## 2019-08-25 DIAGNOSIS — F32A Depression, unspecified: Secondary | ICD-10-CM

## 2019-08-25 DIAGNOSIS — J301 Allergic rhinitis due to pollen: Secondary | ICD-10-CM | POA: Diagnosis not present

## 2019-08-25 MED ORDER — POTASSIUM CHLORIDE ER 10 MEQ PO TBCR: 10.0000 meq | EXTENDED_RELEASE_TABLET | Freq: Every day | ORAL | 0 refills | Status: DC

## 2019-08-25 NOTE — Progress Notes (Signed)
Patient ID: Erin Camacho, female   DOB: 31-Oct-1946, 73 y.o.   MRN: [id number]   Subjective:    Patient ID: Erin Camacho, female    DOB: 1946/05/10, 73 y.o.   MRN: [id number]  HPI  Patient here for scheduled follow up. She is planning for total hip arthroplasty 08/31/19.  Had pre op EKG and labs recently.  Found to have low potassium on recent lab - 3.3.  Reports increased pain in her back and hip.  Limiting her mobility.  Seeing Dr Marry Guan.  Reports no chest pain.  Denies increased sob.  No cough or congestion.  Being evaluated by GI for dysphagia and early satiety.  Had barium swallow - esophageal dysmotility and GERD.  On omeprazole 55m bid.  Per report, improved.  No abdominal pain.  Bowels moving.  States blood sugars doing well.  States am sugar this am - 124.  Recent a1c 5.9.     Past Medical History:  Diagnosis Date  . Allergy    hay fever  . Anxiety   . Arthritis   . Chronic back pain   . Diabetes mellitus    no meds      pt states she is not diabetic  . Dyspnea   . GERD (gastroesophageal reflux disease)   . History of hiatal hernia   . Hyperlipidemia   . Hypertension   . Pneumonia   . Spinal stenosis   . UTI (lower urinary tract infection)    Past Surgical History:  Procedure Laterality Date  . ABDOMINAL HYSTERECTOMY  1973  . ABDOMINAL HYSTERECTOMY    . APPENDECTOMY    . APPLICATION OF ROBOTIC ASSISTANCE FOR SPINAL PROCEDURE N/A 09/15/2018   Procedure: APPLICATION OF ROBOTIC ASSISTANCE FOR SPINAL PROCEDURE;  Surgeon: EKristeen Miss MD;  [facility] [facility]  Service: Neurosurgery;  Laterality: N/A;  . BACK SURGERY  04/17/13, 2017  . CHOLECYSTECTOMY  1995  . HERNIA REPAIR  08/2006  . lap band  08/2003  . SEPTOPLASTY  04/1978  . TONSILLECTOMY AND ADENOIDECTOMY  1979  . TOTAL HIP ARTHROPLASTY  02/2012   Right, Dr. HMarry Guan  Family History  Problem Relation Age of Onset  . Hypertension Mother   . Stroke Mother   . Heart disease Mother   . Hypertension Father   . Stroke  Father   . Heart disease Father   . Arthritis Sister   . Diabetes Sister   . Dementia Sister   . Cancer Sister   . Diabetes Brother   . Hypertension Brother   . Diabetes Brother   . Hypertension Brother   . Breast cancer Other 47  . Cancer Other        breast  . Diabetes Other   . Kidney disease Neg Hx   . Bladder Cancer Neg Hx    Social History   Socioeconomic History  . Marital status: Married    Spouse name: Not on file  . Number of children: Not on file  . Years of education: Not on file  . Highest education level: Not on file  Occupational History  . Not on file  Social Needs  . Financial resource strain: Not hard at all  . Food insecurity    Worry: Never true    Inability: Never true  . Transportation needs    Medical: No    Non-medical: No  Tobacco Use  . Smoking status: Never Smoker  . Smokeless tobacco: Never Used  Substance and Sexual Activity  .  Alcohol use: Yes    Alcohol/week: 0.0 - 1.0 standard drinks    Comment: occ  . Drug use: No  . Sexual activity: Not Currently  Lifestyle  . Physical activity    Days per week: 0 days    Minutes per session: Not on file  . Stress: Only a little  Relationships  . Social Herbalist on phone: Not on file    Gets together: Not on file    Attends religious service: Not on file    Active member of club or organization: Not on file    Attends meetings of clubs or organizations: Not on file    Relationship status: Married  Other Topics Concern  . Not on file  Social History Narrative   Lives in [place] with husband.      Work - retired Sports administrator    Outpatient Encounter Medications as of 08/25/2019  Medication Sig  . ALPRAZolam (XANAX) 0.25 MG tablet Take 1 tablet (0.25 mg total) by mouth daily as needed for anxiety.  . Cholecalciferol (VITAMIN D3) 2000 units TABS Take 2,000 Units by mouth daily.  . fexofenadine (ALLEGRA) 180 MG tablet Take 1 tablet (180 mg total) by mouth daily.  .  fluticasone (FLONASE) 50 MCG/ACT nasal spray SPRAY 2 SPRAYS INTO EACH NOSTRIL EVERY DAY (Patient taking differently: Place 2 sprays into both nostrils at bedtime. )  . Liniments (SALONPAS EX) Apply 1 application topically daily.  Marland Kitchen losartan-hydrochlorothiazide (HYZAAR) 50-12.5 MG tablet TAKE 1 TABLET BY MOUTH EVERY DAY (Patient taking differently: Take 1 tablet by mouth daily. )  . metFORMIN (GLUCOPHAGE) 500 MG tablet TAKE 1 TABLET BY MOUTH EVERY DAY (Patient taking differently: Take 500 mg by mouth daily after supper. )  . metoprolol succinate (TOPROL-XL) 25 MG 24 hr tablet TAKE 1/2 TABLET BY MOUTH DAILY (Patient taking differently: Take 12.5 mg by mouth daily. )  . Multiple Vitamin (MULTIVITAMIN) tablet Take 1 tablet by mouth daily.  . pantoprazole (PROTONIX) 40 MG tablet TAKE 1 TABLET (40 MG TOTAL) BY MOUTH 2 (TWO) TIMES DAILY BEFORE A MEAL.  Marland Kitchen potassium chloride (KLOR-CON) 10 MEQ tablet Take 1 tablet (10 mEq total) by mouth daily.  . sertraline (ZOLOFT) 100 MG tablet TAKE 1/2 TABLET BY MOUTH (50 MG) BY MOUTH DAILY (Patient taking differently: Take 50 mg by mouth daily. )  . simvastatin (ZOCOR) 10 MG tablet TAKE ONE-HALF TABLET (5 MG TOTAL) BY MOUTH AT BEDTIME. (Patient taking differently: Take 5 mg by mouth at bedtime and may repeat dose one time if needed. )  . tiZANidine (ZANAFLEX) 2 MG tablet Take 1 tablet (2 mg total) by mouth every 8 (eight) hours as needed for muscle spasms.  . traMADol (ULTRAM) 50 MG tablet Take 50 mg by mouth 2 (two) times daily.   . traZODone (DESYREL) 100 MG tablet TAKE 1 TABLET BY MOUTH EVERYDAY AT BEDTIME (Patient taking differently: Take 100 mg by mouth at bedtime. )  . vitamin B-12 (CYANOCOBALAMIN) 1000 MCG tablet Take 1,000 mcg by mouth daily.   No facility-administered encounter medications on file as of 08/25/2019.    Review of Systems  Constitutional: Negative for appetite change and unexpected weight change.  HENT: Negative for congestion and sinus pressure.    Respiratory: Negative for cough, chest tightness and shortness of breath.   Cardiovascular: Negative for chest pain, palpitations and leg swelling.  Gastrointestinal: Negative for abdominal pain, diarrhea, nausea and vomiting.  Genitourinary: Negative for difficulty urinating and dysuria.  Musculoskeletal: Negative for myalgias.       Increased back and hip pain.    Skin: Negative for color change and rash.  Neurological: Negative for dizziness, light-headedness and headaches.  Psychiatric/Behavioral: Negative for agitation.       Objective:    Physical Exam Constitutional:      General: She is not in acute distress.    Appearance: Normal appearance.  HENT:     Right Ear: External ear normal.     Left Ear: External ear normal.  Eyes:     General: No scleral icterus.       Right eye: No discharge.        Left eye: No discharge.     Conjunctiva/sclera: Conjunctivae normal.  Neck:     Musculoskeletal: Neck supple. No muscular tenderness.     Thyroid: No thyromegaly.  Cardiovascular:     Rate and Rhythm: Normal rate and regular rhythm.  Pulmonary:     Effort: No respiratory distress.     Breath sounds: Normal breath sounds. No wheezing.  Abdominal:     General: Bowel sounds are normal.     Palpations: Abdomen is soft.     Tenderness: There is no abdominal tenderness.  Musculoskeletal:        General: No swelling or tenderness.  Lymphadenopathy:     Cervical: No cervical adenopathy.  Neurological:     Mental Status: She is alert.  Psychiatric:        Mood and Affect: Mood normal.        Behavior: Behavior normal.     BP 116/76   Pulse 61   Temp (!) 97.3 F (36.3 C)   Resp 16   Wt 184 lb (83.5 kg)   SpO2 97%   BMI 30.62 kg/m  Wt Readings from Last 3 Encounters:  08/25/19 184 lb (83.5 kg)  06/30/19 191 lb (86.6 kg)  04/20/19 190 lb 4.8 oz (86.3 kg)     Lab Results  Component Value Date   WBC 4.6 08/21/2019   HGB 12.0 08/21/2019   HCT 37.0 08/21/2019    PLT 190 08/21/2019   GLUCOSE 116 (H) 08/21/2019   CHOL 163 07/14/2019   TRIG 125.0 07/14/2019   HDL 55.60 07/14/2019   LDLDIRECT 146.5 09/19/2012   LDLCALC 82 07/14/2019   ALT 13 08/21/2019   AST 19 08/21/2019   NA 140 08/21/2019   K 3.3 (L) 08/21/2019   CL 104 08/21/2019   CREATININE 0.67 08/21/2019   BUN 20 08/21/2019   CO2 28 08/21/2019   TSH 0.55 02/13/2019   INR 1.0 08/21/2019   HGBA1C 5.8 (H) 08/21/2019   MICROALBUR 0.2 02/13/2019       Assessment & Plan:   Problem List Items Addressed This Visit    Aortic atherosclerosis ([place])    On simvastatin.       Back pain    S/p lumbar decompression and extension of her prior fusion - 09/15/18.  Followed by Dr Ellene Route.  Now seeing ortho for hip pain and planning for his surgery as outlined.  Continue f/u with ortho.        Diabetes mellitus type 2, diet-controlled (HCC)    Low carb diet.  Recent a1c 5.9.  Follow met b and a1c.       Relevant Orders   Hemoglobin N1A   Basic metabolic panel   Essential (primary) hypertension    Blood pressure under good control.  Continue same medication regimen.  Follow pressures.  Follow metabolic panel.        Relevant Orders   TSH   GERD (gastroesophageal reflux disease)    Being evaluated by GI for dysphagia.  Barium swallow reveals esophageal dysmotility and GERD.  Omeprazole increased to 18m bid.  Per report - improved.  Follow.        Hypercholesterolemia    On simvastatin.  Low cholesterol diet and exercise.  Follow lipid panel and liver function tests.        Relevant Orders   Hepatic function panel   Lipid panel   Mild depression (HCC)    Stable.  Follow.        Preop examination    Recently evaluated for pre op - at hospital.  EKG reviewed - TWI in III and AvF.  Has seen Dr GRockey Situ  D/w cardiology regarding question of need for further cardiac w/up.  Pt currently denies chest pain or sob.  Also found to have low potassium - 3.3.  Start kcl 128m q day x one week.   Will need f/u potassium check day of surgery to confirm wnl.  Also will need close intra op and post op monitoring of heart rate and blood pressure to avoid extremes.        Other Visit Diagnoses    Visit for screening mammogram    -  Primary   Relevant Orders   MM 3D SCREEN BREAST BILATERAL   Need for immunization against influenza       Relevant Orders   Flu Vaccine QUAD High Dose(Fluad) (Completed)       ChEinar PheasantMD

## 2019-08-25 NOTE — Telephone Encounter (Signed)
Pt evaluated today at scheduled appt.  Was prescribed kcl 70meq q day x 7 days.  Will need f/u potassium check prior to her surgery.  Please notify Dr Clydell Hakim office of need for potassium recheck prior to her surgery on Monday.

## 2019-08-25 NOTE — Telephone Encounter (Signed)
Results received. Her potassium was 3.3. Checked by Dr Marry Guan. Patient has appt with PCP today at 3:00.

## 2019-08-26 ENCOUNTER — Telehealth: Payer: Self-pay

## 2019-08-26 ENCOUNTER — Encounter: Payer: Self-pay | Admitting: Internal Medicine

## 2019-08-26 NOTE — Assessment & Plan Note (Signed)
Being evaluated by GI for dysphagia.  Barium swallow reveals esophageal dysmotility and GERD.  Omeprazole increased to 40mg  bid.  Per report - improved.  Follow.

## 2019-08-26 NOTE — Assessment & Plan Note (Addendum)
Recently evaluated for pre op - at hospital.  EKG reviewed - TWI in III and AvF.  Has seen Dr Rockey Situ.  D/w cardiology regarding question of need for further cardiac w/up.  Pt currently denies chest pain or sob.  Also found to have low potassium - 3.3.  Start kcl 26meq q day x one week.  Will need f/u potassium check day of surgery to confirm wnl.  Also will need close intra op and post op monitoring of heart rate and blood pressure to avoid extremes.

## 2019-08-26 NOTE — Telephone Encounter (Signed)
Left message for Dr Marry Guan or his nurse advising them of below and also letting them know that she is seeing cardiology on 10/9 at 12:00 pm.

## 2019-08-26 NOTE — Assessment & Plan Note (Signed)
Blood pressure under good control.  Continue same medication regimen.  Follow pressures.  Follow metabolic panel.   

## 2019-08-26 NOTE — Assessment & Plan Note (Signed)
S/p lumbar decompression and extension of her prior fusion - 09/15/18.  Followed by Dr Ellene Route.  Now seeing ortho for hip pain and planning for his surgery as outlined.  Continue f/u with ortho.

## 2019-08-26 NOTE — Assessment & Plan Note (Signed)
Stable.  Follow.   

## 2019-08-26 NOTE — Assessment & Plan Note (Signed)
On simvastatin.  Low cholesterol diet and exercise.  Follow lipid panel and liver function tests.   

## 2019-08-26 NOTE — Assessment & Plan Note (Signed)
Low carb diet.  Recent a1c 5.9.  Follow met b and a1c.

## 2019-08-26 NOTE — Assessment & Plan Note (Signed)
On simvastatin.   

## 2019-08-26 NOTE — Telephone Encounter (Signed)
Incoming call from [place] from Dr. Lars Mage office, recently seen for surgical clearance of hip scheduled for Monday 10/12.  T wave changes seen on EKG compared to last EKG taken by Dr. Rockey Situ in late 2019. Showed both EKGs to Ignacia Bayley, NP. He felt it would be best to see patient in office since it had been extended time just to make sure she had not been having sx.   Attempted call to patient and left detailed vm of our availability. Also made call back to [facility] RN to make her aware as well.   Surgery is planned for Monday.

## 2019-08-27 ENCOUNTER — Other Ambulatory Visit: Payer: Self-pay

## 2019-08-27 ENCOUNTER — Other Ambulatory Visit
Admission: RE | Admit: 2019-08-27 | Discharge: 2019-08-27 | Disposition: A | Discharge status: Home / Self Care | Payer: PPO | Source: Ambulatory Visit | Attending: Orthopedic Surgery | Admitting: Orthopedic Surgery

## 2019-08-27 DIAGNOSIS — Z01812 Encounter for preprocedural laboratory examination: Secondary | ICD-10-CM | POA: Diagnosis present

## 2019-08-27 DIAGNOSIS — Z20828 Contact with and (suspected) exposure to other viral communicable diseases: Secondary | ICD-10-CM | POA: Insufficient documentation

## 2019-08-27 LAB — SARS CORONAVIRUS 2 (TAT 6-24 HRS): SARS Coronavirus 2: NEGATIVE

## 2019-08-28 ENCOUNTER — Encounter: Payer: Self-pay | Admitting: Nurse Practitioner

## 2019-08-28 ENCOUNTER — Telehealth: Payer: Self-pay | Admitting: Cardiovascular Disease

## 2019-08-28 ENCOUNTER — Ambulatory Visit (INDEPENDENT_AMBULATORY_CARE_PROVIDER_SITE_OTHER): Payer: PPO | Admitting: Nurse Practitioner

## 2019-08-28 VITALS — BP 130/80 | HR 56 | Ht 65.0 in | Wt 185.5 lb

## 2019-08-28 DIAGNOSIS — E782 Mixed hyperlipidemia: Secondary | ICD-10-CM

## 2019-08-28 DIAGNOSIS — I700 Atherosclerosis of aorta: Secondary | ICD-10-CM

## 2019-08-28 DIAGNOSIS — Z01810 Encounter for preprocedural cardiovascular examination: Secondary | ICD-10-CM | POA: Diagnosis not present

## 2019-08-28 DIAGNOSIS — I10 Essential (primary) hypertension: Secondary | ICD-10-CM

## 2019-08-28 NOTE — Progress Notes (Signed)
Office Visit    Patient Name: Erin Camacho Date of Encounter: 08/28/2019  Primary Care Provider:  [name], MD Primary Cardiologist:  [name], MD  Chief Complaint    73 year old female with a history of hypertension, hyperlipidemia, diabetes, obesity status post lap band, aortic atherosclerosis noted on CT, GERD, anxiety, chronic low back pain status post surgeries, and osteoarthritis status post right total hip arthroplasty, who presents for follow-up and preoperative evaluation pending left total hip arthroplasty.  Past Medical History    Past Medical History:  Diagnosis Date  . Allergy    hay fever  . Anxiety   . Arthritis   . Chronic back pain   . Diabetes mellitus   . Dyspnea    a. 07/2017 Echo: EF 60-65%. No rwma. Mildly dil LA. Nl RV fxn.  Marland Kitchen GERD (gastroesophageal reflux disease)   . History of hiatal hernia   . Hyperlipidemia   . Hypertension   . Osteoarthritis   . Pneumonia   . Spinal stenosis   . UTI (lower urinary tract infection)    Past Surgical History:  Procedure Laterality Date  . ABDOMINAL HYSTERECTOMY  1973  . ABDOMINAL HYSTERECTOMY    . APPENDECTOMY    . APPLICATION OF ROBOTIC ASSISTANCE FOR SPINAL PROCEDURE N/A 09/15/2018   Procedure: APPLICATION OF ROBOTIC ASSISTANCE FOR SPINAL PROCEDURE;  Surgeon: Kristeen Miss, MD;  Location: [place];  Service: Neurosurgery;  Laterality: N/A;  . BACK SURGERY  04/17/13, 2017  . CHOLECYSTECTOMY  1995  . HERNIA REPAIR  08/2006  . lap band  08/2003  . SEPTOPLASTY  04/1978  . TONSILLECTOMY AND ADENOIDECTOMY  1979  . TOTAL HIP ARTHROPLASTY  02/2012   Right, Dr. Marry Guan    Allergies  Allergies  Allergen Reactions  . Adhesive [Tape] Itching    Honeycomb bandage     History of Present Illness    73 year old female with the above past medical history including hypertension, hyperlipidemia, diabetes, obesity status post lap band, aortic atherosclerosis noted on CT, GERD, anxiety, chronic low back  pain status post surgeries, and osteoarthritis.  She also has a history of dyspnea on exertion with echocardiogram in 2018 showing an EF of 60 to 65% without regional wall motion abnormalities.  She was last seen in clinic in September 2019, at which time she was doing well.  Following that visit, she subsequently underwent low back surgery and notes a prolonged recovery but by late spring early summer, she felt that she was doing well.  Unfortunately, she has been having progressive left hip pain which is sharp in nature and occurs with prolonged walking or position changes.  She has been evaluated by Ortho and is now pending left total hip arthroplasty next week.  Activity is limited by left hip pain but she uses a cane or walker to ambulate and is able to walk up stairs or walk around a supermarket without any chest pain or dyspnea.  Her niece does all of her housework for her.  She denies palpitations, PND, orthopnea, dizziness, syncope, edema, or early satiety.  Home Medications    Prior to Admission medications   Medication Sig Start Date End Date Taking? Authorizing Provider  ALPRAZolam (XANAX) 0.25 MG tablet Take 1 tablet (0.25 mg total) by mouth daily as needed for anxiety. 02/13/19  Yes [name], MD  Cholecalciferol (VITAMIN D3) 2000 units TABS Take 2,000 Units by mouth daily.   Yes [provider]  fexofenadine (ALLEGRA) 180 MG tablet Take  1 tablet (180 mg total) by mouth daily. 01/18/14  Yes Jackolyn Confer, MD  fluticasone (FLONASE) 50 MCG/ACT nasal spray SPRAY 2 SPRAYS INTO EACH NOSTRIL EVERY DAY Patient taking differently: Place 2 sprays into both nostrils at bedtime.  07/07/19  Yes [name], MD  Liniments [facility] EX) Apply 1 application topically daily.   Yes [provider]  losartan-hydrochlorothiazide (HYZAAR) 50-12.5 MG tablet TAKE 1 TABLET BY MOUTH EVERY DAY Patient taking differently: Take 1 tablet by mouth daily.  07/07/19  Yes [name], MD   metFORMIN (GLUCOPHAGE) 500 MG tablet TAKE 1 TABLET BY MOUTH EVERY DAY Patient taking differently: Take 500 mg by mouth daily after supper.  06/18/19  Yes [name], MD  metoprolol succinate (TOPROL-XL) 25 MG 24 hr tablet TAKE 1/2 TABLET BY MOUTH DAILY Patient taking differently: Take 12.5 mg by mouth daily.  07/07/19  Yes [name], MD  Multiple Vitamin (MULTIVITAMIN) tablet Take 1 tablet by mouth daily.   Yes [provider]  pantoprazole (PROTONIX) 40 MG tablet TAKE 1 TABLET (40 MG TOTAL) BY MOUTH 2 (TWO) TIMES DAILY BEFORE A MEAL. 03/09/19  Yes [name], MD  potassium chloride (KLOR-CON) 10 MEQ tablet Take 1 tablet (10 mEq total) by mouth daily. 08/25/19  Yes [name], MD  sertraline (ZOLOFT) 100 MG tablet TAKE 1/2 TABLET BY MOUTH (50 MG) BY MOUTH DAILY Patient taking differently: Take 50 mg by mouth daily.  08/06/19  Yes Crecencio Mc, MD  simvastatin (ZOCOR) 10 MG tablet TAKE ONE-HALF TABLET (5 MG TOTAL) BY MOUTH AT BEDTIME. Patient taking differently: Take 5 mg by mouth at bedtime and may repeat dose one time if needed.  04/28/19  Yes [name], MD  tiZANidine (ZANAFLEX) 2 MG tablet Take 1 tablet (2 mg total) by mouth every 8 (eight) hours as needed for muscle spasms. 10/04/18  Yes Amin, Ankit Chirag, MD  traMADol (ULTRAM) 50 MG tablet Take 50 mg by mouth 2 (two) times daily.  10/15/18  Yes [provider]  traZODone (DESYREL) 100 MG tablet TAKE 1 TABLET BY MOUTH EVERYDAY AT BEDTIME Patient taking differently: Take 100 mg by mouth at bedtime.  07/07/19  Yes [name], MD  vitamin B-12 (CYANOCOBALAMIN) 1000 MCG tablet Take 1,000 mcg by mouth daily.   Yes [provider]    Review of Systems    Left hip pain.  She denies chest pain, palpitations, dyspnea, pnd, orthopnea, n, v, dizziness, syncope, edema, weight gain, or early satiety.  All other systems reviewed and are otherwise negative except as noted above.  Physical Exam     VS:  BP 130/80 (BP Location: Left Arm, Patient Position: Sitting, Cuff Size: Normal)   Pulse (!) 56   Ht 5\' 5"  (1.651 m)   Wt 185 lb 8 oz (84.1 kg)   SpO2 96%   BMI 30.87 kg/m  , BMI Body mass index is 30.87 kg/m. GEN: Well nourished, well developed, in no acute distress. HEENT: normal. Neck: Supple, no JVD, carotid bruits, or masses. Cardiac: RRR, no murmurs, rubs, or gallops. No clubbing, cyanosis, edema.  Radials/PT 2+ and equal bilaterally.  Respiratory:  Respirations regular and unlabored, clear to auscultation bilaterally. GI: Soft, nontender, nondistended, BS + x 4. MS: no deformity or atrophy. Skin: warm and dry, no rash. Neuro:  Strength and sensation are intact. Psych: Normal affect.  Accessory Clinical Findings    ECG personally reviewed by me today -sinus bradycardia, 56, left axis, LVH with repolarization abnormalities including  nonspecific T changes, IVCD - no acute changes.  Lab Results  Component Value Date   WBC 4.6 08/21/2019   HGB 12.0 08/21/2019   HCT 37.0 08/21/2019   MCV 92.5 08/21/2019   PLT 190 08/21/2019   Lab Results  Component Value Date   CREATININE 0.67 08/21/2019   BUN 20 08/21/2019   NA 140 08/21/2019   K 3.3 (L) 08/21/2019   CL 104 08/21/2019   CO2 28 08/21/2019   Lab Results  Component Value Date   ALT 13 08/21/2019   AST 19 08/21/2019   ALKPHOS 80 08/21/2019   BILITOT 0.7 08/21/2019   Lab Results  Component Value Date   CHOL 163 07/14/2019   HDL 55.60 07/14/2019   LDLCALC 82 07/14/2019   LDLDIRECT 146.5 09/19/2012   TRIG 125.0 07/14/2019   CHOLHDL 3 07/14/2019     Assessment & Plan    1.  Preoperative cardiovascular examination/osteoarthritis: Pending left total hip arthroplasty.  She has significant pain with ambulation and position changes and this is limited her activity.  That said, she is able to ambulate with a cane and walk up stairs or walk around a supermarket behind a shopping cart.  She denies any chest pain or  dyspnea.  ECG at recent preop visit was notable for sinus bradycardia w/ more pronounced inferior T changes, though similar changes have been noted on older ECGs dating back to 2017.  There are no acute changes today.  Risk of major cardiac event is 0.4%, placing her at low risk and therefore, she may proceed to surgery without any additional ischemic evaluation.  I have recommended that she continue beta-blocker and statin therapy throughout the perioperative period.  2.  Essential hypertension: Stable on beta-blocker and losartan-HCTZ.  3.  Hyperlipidemia: LDL of 82 in August on statin therapy.  4.  Type 2 diabetes mellitus: Most recent A1c was 5.8 and she is managed with metformin.  5.  Aortic atherosclerosis: Noted on CT.  Remains on statin therapy.  6.  Disposition: Follow-up with Dr. Rockey Situ in 1 year or sooner if necessary. Murray Hodgkins, NP 08/28/2019, 3:11 PM

## 2019-08-28 NOTE — Telephone Encounter (Signed)
° °  [facility] Pre-operative Risk Assessment    Request for surgical clearance:  1. What type of surgery is being performed? Total Hip   2. When is this surgery scheduled? 08/31/19   3. What type of clearance is required (medical clearance vs. Pharmacy clearance to hold med vs. Both)? BOTH   4. Are there any medications that need to be held prior to surgery and how long? Please advise   5. Practice name and name of physician performing surgery?  Dr. [facility]   6. What is your office phone number [phone]   7.   What is your office fax number [phone]  8.   Anesthesia type (None, local, MAC, general) ? Choice   Saw chris berge for clearance today  Clarisse Gouge 08/28/2019, 1:53 PM  _________________________________________________________________   (provider comments below)

## 2019-08-28 NOTE — Patient Instructions (Signed)
Medication Instructions:  Your physician recommends that you continue on your current medications as directed. Please refer to the Current Medication list given to you today.  If you need a refill on your cardiac medications before your next appointment, please call your pharmacy.   Lab work: None ordered  If you have labs (blood work) drawn today and your tests are completely normal, you will receive your results only by: Marland Kitchen MyChart Message (if you have MyChart) OR . A paper copy in the mail If you have any lab test that is abnormal or we need to change your treatment, we will call you to review the results.  Testing/Procedures: None ordered   Follow-Up: At [facility], you and your health needs are our priority.  As part of our continuing mission to provide you with exceptional heart care, we have created designated Provider Care Teams.  These Care Teams include your primary Cardiologist (physician) and Advanced Practice Providers (APPs -  Physician Assistants and Nurse Practitioners) who all work together to provide you with the care you need, when you need it. You will need a follow up appointment in 12 months.  Please call our office 2 months in advance to schedule this appointment.  You may see [name], MD or Murray Hodgkins, NP

## 2019-08-30 ENCOUNTER — Encounter: Payer: Self-pay | Admitting: Orthopedic Surgery

## 2019-08-30 NOTE — H&P (Signed)
[facility] Chief Complaint:   Patient presents with  . Hip Pain  H & P LEFT HIP   History of Present Illness:   Erin Camacho is a 73 y.o. female that presents to clinic today for her preoperative history and physical evaluation. The patient presents with her husband. Patient is scheduled to undergo a left total hip arthroplasty through posterior approach on 08/31/2019 with Dr. Marry Guan. The patient reports a multi-month history of left hip and groin pain. These pains have not been treated successfully with conservative measures. She previously experienced approximately 1 month of relief following intra-articular corticosteroid injection to the left hip.  Past Medical, Surgical, Family, Social History, Allergies, Medications:  Current Problem List:has Bursitis, shoulder; Lumbar radiculitis; Impingement syndrome of both shoulders; DDD (degenerative disc disease), lumbar; Spondylosis of lumbar region without myelopathy or radiculopathy; Status post total replacement of right hip; Dysuria; Other abnormal glucose; Bariatric surgery status; Obesity; Major depressive disorder, single episode; Essential (primary) hypertension; Insomnia; Postmenopausal atrophic vaginitis; Hyperlipidemia; Aortic atherosclerosis (CMS-HCC); Status post lumbar spine operation; Spondylolisthesis at L5-S1 level; GERD (gastroesophageal reflux disease); Diabetes mellitus type 2, diet-controlled (CMS-HCC); Lumbar stenosis with neurogenic claudication; Mild depression (CMS-HCC); and Back pain on their problem list.  Past Medical History: has a past medical history of Bronchitis, Colon polyps, DDD (degenerative disc disease), lumbar, Depression, Diabetes mellitus type 2, uncomplicated (CMS-HCC), Environmental allergies, GERD (gastroesophageal reflux disease), Hyperlipidemia, Hypertension, Restless leg syndrome, and Sleep apnea.   Past Surgical History: has a past surgical  history that includes Tonsillectomy; Hysterectomy (1973); Cholecystectomy; Septoplasty; Laparoscopic gastric band procedure (08/20/2003); Hernia repair (08/21/2006); Gastric lap band adjustment (08/2008); Back surgery (04/18/2013); Right total hip arthroplasty (03/12/2012); and Back surgery (11/2015).  Current Medications: has a current medication list which includes the following prescription(s): albuterol, alprazolam, cholecalciferol, cyanocobalamin, fexofenadine, fluticasone propionate, lidocaine hcl, losartan-hydrochlorothiazide, metformin, metoprolol succinate, multivitamin, onetouch delica plus lancet, onetouch verio test strips, pantoprazole, polyethylene glycol, sertraline, simvastatin, tizanidine, tramadol, and trazodone.  Allergies: is allergic to tapentadol.  Social History: reports that she has never smoked. She has never used smokeless tobacco. She reports current alcohol use. She reports that she does not use drugs.  Family History: family history includes Brain hemorrhage in her father; Heart block in her brother; Multiple sclerosis in her brother; Stomach cancer in her sister; Stroke in her mother.  Review of Systems:   A 10+ ROS was performed, reviewed, and the pertinent orthopaedic findings are documented in the HPI.   Physical Examination:   BP 110/66  Ht 165.1 cm (5\' 5" )  Wt 85.4 kg (188 lb 3.2 oz)  BMI 31.32 kg/m   Patient is a well-developed, well-nourished female in no acute distress. Patient has normal mood and affect. Patient is alert and oriented to person, place, and time. Pupils are equal and round with synchronous movement. No injected sclera. There is no palpable lymphadenopathy. Respirations are normal, without noticeable retractions.   Patient ambulates with assistance from a cane.  General:  Well-developed, well-nourished female seen in no acute distress.  Antalgic gait without evidence of significant abductor lurch.  Cardiac: Regular rate and rhythm, with  no discernible murmurs, rubs, or gallops. Respiratory: Lungs clear to auscultation bilaterally  Right Hip:  Pelvic tilt: Negative Limb lengths: Equal with the patient standing. Soft tissue swelling: Negative Erythema: Negative Crepitance: Negative Tenderness: Greater trochanter is nontender to palpation. No pain is elicited by axial compression or extremes of rotation. Atrophy: No atrophy. Good hip  flexor and abductor strength. Incision: Well healed Range of Motion: Good active range of motion.  Left Hip: Soft tissue swelling: Negative Erythema: Negative Crepitance: Negative Tenderness: Greater trochanter is nontender to palpation. Moderate pain is elicited by axial compression or extremes of rotation. Atrophy: No atrophy. Fair to good hip flexor and abductor strength. Range of Motion: EXT/FLEX: 10/0/110 ADD/ABD: 20/0/20 IR/ER: 20/0/30  Vascular: Peripheral pulses are palpable. Good capillary refill. No gross pretibial or ankle edema. Homans test is negative.  Neurologic:  Awake, alert, and oriented.  Sensory function is intact to pinprick and light touch.  Motor strength is judged to be 5/5.  Motor coordination is grossly within normal limits.  No apparent clonus. No tremor.   Tests Performed/Reviewed:  X-rays  Anteroposterior view of the pelvis, as well as an anteroposterior and lateral view of the left hip were obtained. Images reveal moderate loss of joint space with osteophyte formation. There are no acute fractures noted. There is no heterotopic ossification or other osseous abnormality noted. Right total hip arthroplasty present without signs of loosening, migration, or failure.  I personally reviewed and visualized the imaging studies if available. I additionally personally interpreted any radiographs taken during today's visit.  Impression:   ICD-10-CM  1. Acute pain of left hip M25.552  2. Primary osteoarthritis of left hip M16.12   SECONDARY CONDITIONS THAT  INFLUENCE TREATMENT AND DECISION-MAKING:  Plan:   The patient will proceed with a left total hip arthroplasty through posterior approach by Dr. Marry Guan on 08/31/2019. The patient is instructed to discontinue all blood thinners and NSAIDs she may be taking. The risks of surgery, including infection and DVT formation, were discussed with the patient. Patient elected to proceed with surgery. The patient is instructed to call the hospital the day before surgery for her arrival time.  Patient is to follow-up in the office 6 weeks following the surgery.  Contact our office with any questions or concerns. Follow up as indicated, or sooner should any new problems arise, if conditions worsen, or if they are otherwise concerned.   Gwenlyn Fudge, PA [facility] [place],  [address] Phone: [phone]  This note was generated in part with voice recognition software and I apologize for any typographical errors that were not detected and corrected.  Electronically signed by Gwenlyn Fudge, PA at 08/21/2019 12:48 PM EDT

## 2019-08-31 ENCOUNTER — Encounter
Admission: RE | Disposition: A | Discharge status: Home Health | Payer: Self-pay | Source: Home / Self Care | Attending: Orthopedic Surgery

## 2019-08-31 ENCOUNTER — Inpatient Hospital Stay: Payer: PPO

## 2019-08-31 ENCOUNTER — Inpatient Hospital Stay
Admission: RE | Admit: 2019-08-31 | Discharge: 2019-09-02 | DRG: 470 | Disposition: A | Discharge status: Home Health | Payer: PPO | Attending: Orthopedic Surgery | Admitting: Orthopedic Surgery

## 2019-08-31 ENCOUNTER — Inpatient Hospital Stay: Payer: PPO | Admitting: Anesthesiology

## 2019-08-31 ENCOUNTER — Other Ambulatory Visit: Payer: Self-pay

## 2019-08-31 DIAGNOSIS — E7800 Pure hypercholesterolemia, unspecified: Secondary | ICD-10-CM | POA: Diagnosis not present

## 2019-08-31 DIAGNOSIS — I10 Essential (primary) hypertension: Secondary | ICD-10-CM | POA: Diagnosis present

## 2019-08-31 DIAGNOSIS — F419 Anxiety disorder, unspecified: Secondary | ICD-10-CM | POA: Diagnosis present

## 2019-08-31 DIAGNOSIS — E119 Type 2 diabetes mellitus without complications: Secondary | ICD-10-CM | POA: Diagnosis present

## 2019-08-31 DIAGNOSIS — Z79899 Other long term (current) drug therapy: Secondary | ICD-10-CM | POA: Diagnosis not present

## 2019-08-31 DIAGNOSIS — Z96649 Presence of unspecified artificial hip joint: Secondary | ICD-10-CM

## 2019-08-31 DIAGNOSIS — K219 Gastro-esophageal reflux disease without esophagitis: Secondary | ICD-10-CM | POA: Diagnosis present

## 2019-08-31 DIAGNOSIS — M25552 Pain in left hip: Secondary | ICD-10-CM | POA: Diagnosis present

## 2019-08-31 DIAGNOSIS — Z90710 Acquired absence of both cervix and uterus: Secondary | ICD-10-CM

## 2019-08-31 DIAGNOSIS — Z96642 Presence of left artificial hip joint: Secondary | ICD-10-CM | POA: Diagnosis not present

## 2019-08-31 DIAGNOSIS — J301 Allergic rhinitis due to pollen: Secondary | ICD-10-CM | POA: Diagnosis not present

## 2019-08-31 DIAGNOSIS — E785 Hyperlipidemia, unspecified: Secondary | ICD-10-CM | POA: Diagnosis present

## 2019-08-31 DIAGNOSIS — Z6830 Body mass index (BMI) 30.0-30.9, adult: Secondary | ICD-10-CM | POA: Diagnosis not present

## 2019-08-31 DIAGNOSIS — Z96641 Presence of right artificial hip joint: Secondary | ICD-10-CM | POA: Diagnosis present

## 2019-08-31 DIAGNOSIS — G2581 Restless legs syndrome: Secondary | ICD-10-CM | POA: Diagnosis present

## 2019-08-31 DIAGNOSIS — G4700 Insomnia, unspecified: Secondary | ICD-10-CM | POA: Diagnosis not present

## 2019-08-31 DIAGNOSIS — Z7984 Long term (current) use of oral hypoglycemic drugs: Secondary | ICD-10-CM | POA: Diagnosis not present

## 2019-08-31 DIAGNOSIS — M1612 Unilateral primary osteoarthritis, left hip: Principal | ICD-10-CM | POA: Diagnosis present

## 2019-08-31 DIAGNOSIS — E669 Obesity, unspecified: Secondary | ICD-10-CM | POA: Diagnosis not present

## 2019-08-31 DIAGNOSIS — Z471 Aftercare following joint replacement surgery: Secondary | ICD-10-CM | POA: Diagnosis not present

## 2019-08-31 HISTORY — PX: TOTAL HIP ARTHROPLASTY: SHX124

## 2019-08-31 LAB — GLUCOSE, CAPILLARY
Glucose-Capillary: 106 mg/dL — ABNORMAL HIGH (ref 70–99)
Glucose-Capillary: 163 mg/dL — ABNORMAL HIGH (ref 70–99)
Glucose-Capillary: 158 mg/dL — ABNORMAL HIGH (ref 70–99)
Glucose-Capillary: 130 mg/dL — ABNORMAL HIGH (ref 70–99)

## 2019-08-31 LAB — ABO/RH: ABO/RH(D): O POS

## 2019-08-31 SURGERY — TOTAL HIP ARTHROPLASTY
Anesthesia: General | Site: Hip | Laterality: Left

## 2019-08-31 MED ORDER — ONDANSETRON HCL 4 MG/2ML IJ SOLN: 4.0000 mg | Freq: Four times a day (QID) | INTRAMUSCULAR | Status: DC | PRN

## 2019-08-31 MED ORDER — LIDOCAINE HCL (PF) 2 % IJ SOLN
INTRAMUSCULAR | Status: AC
  Filled 2019-08-31: qty 10

## 2019-08-31 MED ORDER — NEOMYCIN-POLYMYXIN B GU 40-200000 IR SOLN
Status: DC | PRN
  Administered 2019-08-31: 14 mL

## 2019-08-31 MED ORDER — PROPOFOL 10 MG/ML IV BOLUS
INTRAVENOUS | Status: AC
  Filled 2019-08-31: qty 20

## 2019-08-31 MED ORDER — ENSURE ENLIVE PO LIQD: 296.0000 mL | Freq: Once | ORAL | Status: DC

## 2019-08-31 MED ORDER — CEFAZOLIN SODIUM-DEXTROSE 2-4 GM/100ML-% IV SOLN
INTRAVENOUS | Status: AC
  Filled 2019-08-31: qty 100

## 2019-08-31 MED ORDER — VITAMIN B-12 1000 MCG PO TABS
1000.0000 ug | ORAL_TABLET | Freq: Every day | ORAL | Status: DC
  Administered 2019-09-01 – 2019-09-02 (×2): 1000 ug via ORAL
  Filled 2019-08-31 (×2): qty 1

## 2019-08-31 MED ORDER — LOSARTAN POTASSIUM-HCTZ 50-12.5 MG PO TABS: 1.0000 | ORAL_TABLET | Freq: Every day | ORAL | Status: DC

## 2019-08-31 MED ORDER — FENTANYL CITRATE (PF) 250 MCG/5ML IJ SOLN
INTRAMUSCULAR | Status: AC
  Filled 2019-08-31: qty 5

## 2019-08-31 MED ORDER — ACETAMINOPHEN 10 MG/ML IV SOLN
1000.0000 mg | Freq: Four times a day (QID) | INTRAVENOUS | Status: AC
  Administered 2019-08-31 – 2019-09-01 (×3): 1000 mg via INTRAVENOUS
  Filled 2019-08-31 (×4): qty 100

## 2019-08-31 MED ORDER — BISACODYL 10 MG RE SUPP: 10.0000 mg | Freq: Every day | RECTAL | Status: DC | PRN

## 2019-08-31 MED ORDER — DEXAMETHASONE SODIUM PHOSPHATE 10 MG/ML IJ SOLN
8.0000 mg | Freq: Once | INTRAMUSCULAR | Status: AC
  Administered 2019-08-31: 07:00:00 8 mg via INTRAVENOUS

## 2019-08-31 MED ORDER — FAMOTIDINE 20 MG PO TABS
ORAL_TABLET | ORAL | Status: AC
  Administered 2019-08-31: 20 mg via ORAL
  Filled 2019-08-31: qty 1

## 2019-08-31 MED ORDER — MENTHOL 3 MG MT LOZG
1.0000 | LOZENGE | OROMUCOSAL | Status: DC | PRN
  Filled 2019-08-31 (×2): qty 9

## 2019-08-31 MED ORDER — HYDROCHLOROTHIAZIDE 12.5 MG PO CAPS: 12.5000 mg | ORAL_CAPSULE | Freq: Every day | ORAL | Status: DC

## 2019-08-31 MED ORDER — PANTOPRAZOLE SODIUM 40 MG PO TBEC
40.0000 mg | DELAYED_RELEASE_TABLET | Freq: Two times a day (BID) | ORAL | Status: DC
  Administered 2019-08-31 – 2019-09-02 (×4): 40 mg via ORAL
  Filled 2019-08-31 (×4): qty 1

## 2019-08-31 MED ORDER — LOSARTAN POTASSIUM 50 MG PO TABS: 50.0000 mg | ORAL_TABLET | Freq: Every day | ORAL | Status: DC

## 2019-08-31 MED ORDER — METFORMIN HCL 500 MG PO TABS: 500.0000 mg | ORAL_TABLET | Freq: Every day | ORAL | Status: DC

## 2019-08-31 MED ORDER — INSULIN ASPART 100 UNIT/ML ~~LOC~~ SOLN
0.0000 [IU] | Freq: Three times a day (TID) | SUBCUTANEOUS | Status: DC
  Administered 2019-08-31: 17:00:00 3 [IU] via SUBCUTANEOUS
  Administered 2019-09-02: 08:00:00 2 [IU] via SUBCUTANEOUS
  Filled 2019-08-31 (×2): qty 1

## 2019-08-31 MED ORDER — TRANEXAMIC ACID-NACL 1000-0.7 MG/100ML-% IV SOLN
1000.0000 mg | INTRAVENOUS | Status: AC
  Administered 2019-08-31: 1000 mg via INTRAVENOUS
  Filled 2019-08-31: qty 100

## 2019-08-31 MED ORDER — METFORMIN HCL 500 MG PO TABS
500.0000 mg | ORAL_TABLET | Freq: Every day | ORAL | Status: DC
  Administered 2019-08-31 – 2019-09-01 (×2): 500 mg via ORAL
  Filled 2019-08-31 (×2): qty 1

## 2019-08-31 MED ORDER — CHLORHEXIDINE GLUCONATE 4 % EX LIQD: 60.0000 mL | Freq: Once | CUTANEOUS | Status: DC

## 2019-08-31 MED ORDER — EPHEDRINE SULFATE 50 MG/ML IJ SOLN
INTRAMUSCULAR | Status: DC | PRN
  Administered 2019-08-31: 10 mg via INTRAVENOUS

## 2019-08-31 MED ORDER — ONDANSETRON HCL 4 MG PO TABS: 4.0000 mg | ORAL_TABLET | Freq: Four times a day (QID) | ORAL | Status: DC | PRN

## 2019-08-31 MED ORDER — ALPRAZOLAM 0.25 MG PO TABS: 0.2500 mg | ORAL_TABLET | Freq: Every day | ORAL | Status: DC | PRN

## 2019-08-31 MED ORDER — FENTANYL CITRATE (PF) 100 MCG/2ML IJ SOLN
25.0000 ug | INTRAMUSCULAR | Status: DC | PRN
  Administered 2019-08-31 (×5): 25 ug via INTRAVENOUS

## 2019-08-31 MED ORDER — PHENOL 1.4 % MT LIQD
1.0000 | OROMUCOSAL | Status: DC | PRN
  Filled 2019-08-31: qty 177

## 2019-08-31 MED ORDER — HYDROMORPHONE HCL 1 MG/ML IJ SOLN: 0.5000 mg | INTRAMUSCULAR | Status: DC | PRN

## 2019-08-31 MED ORDER — SODIUM CHLORIDE 0.9 % IV SOLN
INTRAVENOUS | Status: DC
  Administered 2019-08-31 (×3): via INTRAVENOUS

## 2019-08-31 MED ORDER — PROPOFOL 10 MG/ML IV BOLUS
INTRAVENOUS | Status: DC | PRN
  Administered 2019-08-31: 10 mg via INTRAVENOUS
  Administered 2019-08-31: 150 mg via INTRAVENOUS
  Administered 2019-08-31: 15 mg via INTRAVENOUS

## 2019-08-31 MED ORDER — ONDANSETRON HCL 4 MG/2ML IJ SOLN
INTRAMUSCULAR | Status: AC
  Filled 2019-08-31: qty 2

## 2019-08-31 MED ORDER — ONDANSETRON HCL 4 MG/2ML IJ SOLN: 4.0000 mg | Freq: Once | INTRAMUSCULAR | Status: DC | PRN

## 2019-08-31 MED ORDER — DIPHENHYDRAMINE HCL 12.5 MG/5ML PO ELIX: 12.5000 mg | ORAL_SOLUTION | ORAL | Status: DC | PRN

## 2019-08-31 MED ORDER — BUPIVACAINE HCL (PF) 0.5 % IJ SOLN
INTRAMUSCULAR | Status: AC
  Filled 2019-08-31: qty 10

## 2019-08-31 MED ORDER — HYDROMORPHONE HCL 1 MG/ML IJ SOLN
INTRAMUSCULAR | Status: AC
  Administered 2019-08-31: 0.25 mg via INTRAVENOUS
  Filled 2019-08-31: qty 1

## 2019-08-31 MED ORDER — ENOXAPARIN SODIUM 30 MG/0.3ML ~~LOC~~ SOLN
30.0000 mg | Freq: Two times a day (BID) | SUBCUTANEOUS | Status: DC
  Administered 2019-09-01 – 2019-09-02 (×3): 30 mg via SUBCUTANEOUS
  Filled 2019-08-31 (×3): qty 0.3

## 2019-08-31 MED ORDER — CELECOXIB 200 MG PO CAPS
ORAL_CAPSULE | ORAL | Status: AC
  Administered 2019-08-31: 400 mg via ORAL
  Filled 2019-08-31: qty 2

## 2019-08-31 MED ORDER — ACETAMINOPHEN 10 MG/ML IV SOLN
INTRAVENOUS | Status: AC
  Filled 2019-08-31: qty 100

## 2019-08-31 MED ORDER — FENTANYL CITRATE (PF) 100 MCG/2ML IJ SOLN
INTRAMUSCULAR | Status: DC | PRN
  Administered 2019-08-31: 25 ug via INTRAVENOUS
  Administered 2019-08-31: 100 ug via INTRAVENOUS
  Administered 2019-08-31: 50 ug via INTRAVENOUS
  Administered 2019-08-31: 100 ug via INTRAVENOUS
  Administered 2019-08-31: 25 ug via INTRAVENOUS
  Administered 2019-08-31: 50 ug via INTRAVENOUS

## 2019-08-31 MED ORDER — FENTANYL CITRATE (PF) 100 MCG/2ML IJ SOLN
INTRAMUSCULAR | Status: AC
  Filled 2019-08-31: qty 2

## 2019-08-31 MED ORDER — TRAMADOL HCL 50 MG PO TABS
50.0000 mg | ORAL_TABLET | ORAL | Status: DC | PRN
  Administered 2019-08-31 – 2019-09-01 (×2): 50 mg via ORAL
  Filled 2019-08-31 (×2): qty 1

## 2019-08-31 MED ORDER — CELECOXIB 200 MG PO CAPS
400.0000 mg | ORAL_CAPSULE | Freq: Once | ORAL | Status: AC
  Administered 2019-08-31: 07:00:00 400 mg via ORAL

## 2019-08-31 MED ORDER — VITAMIN D3 25 MCG (1000 UNIT) PO TABS
2000.0000 [IU] | ORAL_TABLET | Freq: Every day | ORAL | Status: DC
  Administered 2019-09-01 – 2019-09-02 (×2): 2000 [IU] via ORAL
  Filled 2019-08-31 (×3): qty 2

## 2019-08-31 MED ORDER — CEFAZOLIN SODIUM-DEXTROSE 2-4 GM/100ML-% IV SOLN
2.0000 g | INTRAVENOUS | Status: AC
  Administered 2019-08-31: 2 g via INTRAVENOUS

## 2019-08-31 MED ORDER — CELECOXIB 200 MG PO CAPS
200.0000 mg | ORAL_CAPSULE | Freq: Two times a day (BID) | ORAL | Status: DC
  Administered 2019-08-31 – 2019-09-02 (×4): 200 mg via ORAL
  Filled 2019-08-31 (×4): qty 1

## 2019-08-31 MED ORDER — LOSARTAN POTASSIUM 50 MG PO TABS
50.0000 mg | ORAL_TABLET | Freq: Every day | ORAL | Status: DC
  Administered 2019-09-01 – 2019-09-02 (×2): 50 mg via ORAL
  Filled 2019-08-31 (×2): qty 1

## 2019-08-31 MED ORDER — PHENYLEPHRINE HCL (PRESSORS) 10 MG/ML IV SOLN
INTRAVENOUS | Status: DC | PRN
  Administered 2019-08-31 (×2): 100 ug via INTRAVENOUS
  Administered 2019-08-31: 200 ug via INTRAVENOUS

## 2019-08-31 MED ORDER — SUGAMMADEX SODIUM 200 MG/2ML IV SOLN
INTRAVENOUS | Status: DC | PRN
  Administered 2019-08-31: 200 mg via INTRAVENOUS

## 2019-08-31 MED ORDER — CEFAZOLIN SODIUM-DEXTROSE 2-4 GM/100ML-% IV SOLN
2.0000 g | Freq: Four times a day (QID) | INTRAVENOUS | Status: AC
  Administered 2019-08-31 – 2019-09-01 (×4): 2 g via INTRAVENOUS
  Filled 2019-08-31 (×4): qty 100

## 2019-08-31 MED ORDER — EPHEDRINE SULFATE 50 MG/ML IJ SOLN
INTRAMUSCULAR | Status: AC
  Filled 2019-08-31: qty 1

## 2019-08-31 MED ORDER — FERROUS SULFATE 325 (65 FE) MG PO TABS
325.0000 mg | ORAL_TABLET | Freq: Two times a day (BID) | ORAL | Status: DC
  Administered 2019-08-31 – 2019-09-02 (×4): 325 mg via ORAL
  Filled 2019-08-31 (×4): qty 1

## 2019-08-31 MED ORDER — MIDAZOLAM HCL 2 MG/2ML IJ SOLN
INTRAMUSCULAR | Status: DC | PRN
  Administered 2019-08-31 (×2): 1 mg via INTRAVENOUS

## 2019-08-31 MED ORDER — ROCURONIUM BROMIDE 100 MG/10ML IV SOLN
INTRAVENOUS | Status: DC | PRN
  Administered 2019-08-31: 100 mg via INTRAVENOUS

## 2019-08-31 MED ORDER — SODIUM CHLORIDE 0.9 % IV SOLN
INTRAVENOUS | Status: DC
  Administered 2019-08-31 (×2): via INTRAVENOUS

## 2019-08-31 MED ORDER — MAGNESIUM HYDROXIDE 400 MG/5ML PO SUSP
30.0000 mL | Freq: Every day | ORAL | Status: DC
  Administered 2019-09-01 – 2019-09-02 (×2): 30 mL via ORAL
  Filled 2019-08-31 (×2): qty 30

## 2019-08-31 MED ORDER — FLUTICASONE PROPIONATE 50 MCG/ACT NA SUSP
2.0000 | Freq: Every day | NASAL | Status: DC
  Administered 2019-08-31 – 2019-09-01 (×2): 2 via NASAL
  Filled 2019-08-31: qty 16

## 2019-08-31 MED ORDER — TIZANIDINE HCL 2 MG PO TABS
2.0000 mg | ORAL_TABLET | Freq: Three times a day (TID) | ORAL | Status: DC | PRN
  Filled 2019-08-31: qty 1

## 2019-08-31 MED ORDER — POTASSIUM CHLORIDE CRYS ER 10 MEQ PO TBCR
10.0000 meq | EXTENDED_RELEASE_TABLET | Freq: Every day | ORAL | Status: DC
  Administered 2019-08-31 – 2019-09-02 (×3): 10 meq via ORAL
  Filled 2019-08-31 (×4): qty 1

## 2019-08-31 MED ORDER — MIDAZOLAM HCL 2 MG/2ML IJ SOLN
INTRAMUSCULAR | Status: AC
  Filled 2019-08-31: qty 2

## 2019-08-31 MED ORDER — ALUM & MAG HYDROXIDE-SIMETH 200-200-20 MG/5ML PO SUSP: 30.0000 mL | ORAL | Status: DC | PRN

## 2019-08-31 MED ORDER — METOCLOPRAMIDE HCL 10 MG PO TABS
10.0000 mg | ORAL_TABLET | Freq: Three times a day (TID) | ORAL | Status: DC
  Administered 2019-08-31 – 2019-09-02 (×7): 10 mg via ORAL
  Filled 2019-08-31 (×7): qty 1

## 2019-08-31 MED ORDER — GLYCOPYRROLATE 0.2 MG/ML IJ SOLN
INTRAMUSCULAR | Status: DC | PRN
  Administered 2019-08-31: 0.2 mg via INTRAVENOUS

## 2019-08-31 MED ORDER — DEXAMETHASONE SODIUM PHOSPHATE 10 MG/ML IJ SOLN
INTRAMUSCULAR | Status: AC
  Administered 2019-08-31: 07:00:00 8 mg via INTRAVENOUS
  Filled 2019-08-31: qty 1

## 2019-08-31 MED ORDER — ONDANSETRON HCL 4 MG/2ML IJ SOLN
INTRAMUSCULAR | Status: DC | PRN
  Administered 2019-08-31: 4 mg via INTRAVENOUS

## 2019-08-31 MED ORDER — PHENYLEPHRINE HCL (PRESSORS) 10 MG/ML IV SOLN
INTRAVENOUS | Status: AC
  Filled 2019-08-31: qty 2

## 2019-08-31 MED ORDER — HYDROCHLOROTHIAZIDE 12.5 MG PO CAPS
12.5000 mg | ORAL_CAPSULE | Freq: Every day | ORAL | Status: DC
  Administered 2019-09-01 – 2019-09-02 (×2): 12.5 mg via ORAL
  Filled 2019-08-31 (×2): qty 1

## 2019-08-31 MED ORDER — SENNOSIDES-DOCUSATE SODIUM 8.6-50 MG PO TABS
1.0000 | ORAL_TABLET | Freq: Two times a day (BID) | ORAL | Status: DC
  Administered 2019-08-31 – 2019-09-02 (×4): 1 via ORAL
  Filled 2019-08-31 (×4): qty 1

## 2019-08-31 MED ORDER — KETAMINE HCL 50 MG/ML IJ SOLN
INTRAMUSCULAR | Status: DC | PRN
  Administered 2019-08-31: 15 mg via INTRAMUSCULAR
  Administered 2019-08-31: 10 mg via INTRAMUSCULAR
  Administered 2019-08-31: 25 mg via INTRAVENOUS

## 2019-08-31 MED ORDER — ACETAMINOPHEN 325 MG PO TABS: 325.0000 mg | ORAL_TABLET | Freq: Four times a day (QID) | ORAL | Status: DC | PRN

## 2019-08-31 MED ORDER — SIMVASTATIN 5 MG PO TABS
5.0000 mg | ORAL_TABLET | Freq: Every day | ORAL | Status: DC
  Administered 2019-08-31 – 2019-09-01 (×2): 5 mg via ORAL
  Filled 2019-08-31 (×3): qty 1

## 2019-08-31 MED ORDER — GABAPENTIN 300 MG PO CAPS
300.0000 mg | ORAL_CAPSULE | Freq: Once | ORAL | Status: AC
  Administered 2019-08-31: 07:00:00 300 mg via ORAL

## 2019-08-31 MED ORDER — SUGAMMADEX SODIUM 200 MG/2ML IV SOLN
INTRAVENOUS | Status: AC
  Filled 2019-08-31: qty 2

## 2019-08-31 MED ORDER — METOPROLOL SUCCINATE ER 25 MG PO TB24
12.5000 mg | ORAL_TABLET | Freq: Every day | ORAL | Status: DC
  Administered 2019-09-01 – 2019-09-02 (×2): 12.5 mg via ORAL
  Filled 2019-08-31 (×2): qty 1

## 2019-08-31 MED ORDER — TRANEXAMIC ACID-NACL 1000-0.7 MG/100ML-% IV SOLN
1000.0000 mg | Freq: Once | INTRAVENOUS | Status: AC
  Administered 2019-08-31: 11:00:00 1000 mg via INTRAVENOUS
  Filled 2019-08-31: qty 100

## 2019-08-31 MED ORDER — OXYCODONE HCL 5 MG PO TABS
5.0000 mg | ORAL_TABLET | ORAL | Status: DC | PRN
  Administered 2019-09-01: 5 mg via ORAL
  Filled 2019-08-31: qty 1

## 2019-08-31 MED ORDER — PROPOFOL 500 MG/50ML IV EMUL
INTRAVENOUS | Status: AC
  Filled 2019-08-31: qty 50

## 2019-08-31 MED ORDER — FENTANYL CITRATE (PF) 100 MCG/2ML IJ SOLN
INTRAMUSCULAR | Status: AC
  Administered 2019-08-31: 12:00:00 25 ug via INTRAVENOUS
  Filled 2019-08-31: qty 2

## 2019-08-31 MED ORDER — LIDOCAINE HCL (CARDIAC) PF 100 MG/5ML IV SOSY
PREFILLED_SYRINGE | INTRAVENOUS | Status: DC | PRN
  Administered 2019-08-31: 100 mg via INTRAVENOUS

## 2019-08-31 MED ORDER — KETAMINE HCL 50 MG/ML IJ SOLN
INTRAMUSCULAR | Status: AC
  Filled 2019-08-31: qty 10

## 2019-08-31 MED ORDER — LORATADINE 10 MG PO TABS
10.0000 mg | ORAL_TABLET | Freq: Every day | ORAL | Status: DC
  Administered 2019-09-01 – 2019-09-02 (×2): 10 mg via ORAL
  Filled 2019-08-31 (×2): qty 1

## 2019-08-31 MED ORDER — TRAZODONE HCL 100 MG PO TABS
100.0000 mg | ORAL_TABLET | Freq: Every day | ORAL | Status: DC
  Administered 2019-08-31 – 2019-09-01 (×2): 100 mg via ORAL
  Filled 2019-08-31 (×2): qty 1

## 2019-08-31 MED ORDER — FLEET ENEMA 7-19 GM/118ML RE ENEM: 1.0000 | ENEMA | Freq: Once | RECTAL | Status: DC | PRN

## 2019-08-31 MED ORDER — METOCLOPRAMIDE HCL 5 MG/ML IJ SOLN: 5.0000 mg | Freq: Three times a day (TID) | INTRAMUSCULAR | Status: DC | PRN

## 2019-08-31 MED ORDER — OXYCODONE HCL 5 MG PO TABS
10.0000 mg | ORAL_TABLET | ORAL | Status: DC | PRN
  Administered 2019-08-31 – 2019-09-01 (×3): 10 mg via ORAL
  Filled 2019-08-31 (×3): qty 2

## 2019-08-31 MED ORDER — GLYCOPYRROLATE 0.2 MG/ML IJ SOLN
INTRAMUSCULAR | Status: AC
  Filled 2019-08-31: qty 1

## 2019-08-31 MED ORDER — SERTRALINE HCL 50 MG PO TABS
50.0000 mg | ORAL_TABLET | Freq: Every day | ORAL | Status: DC
  Administered 2019-09-01 – 2019-09-02 (×2): 50 mg via ORAL
  Filled 2019-08-31 (×2): qty 1

## 2019-08-31 MED ORDER — FAMOTIDINE 20 MG PO TABS
20.0000 mg | ORAL_TABLET | Freq: Once | ORAL | Status: AC
  Administered 2019-08-31: 07:00:00 20 mg via ORAL

## 2019-08-31 MED ORDER — GABAPENTIN 300 MG PO CAPS
300.0000 mg | ORAL_CAPSULE | Freq: Every day | ORAL | Status: DC
  Administered 2019-08-31 – 2019-09-01 (×2): 300 mg via ORAL
  Filled 2019-08-31 (×2): qty 1

## 2019-08-31 MED ORDER — METOCLOPRAMIDE HCL 10 MG PO TABS: 5.0000 mg | ORAL_TABLET | Freq: Three times a day (TID) | ORAL | Status: DC | PRN

## 2019-08-31 MED ORDER — HYDROMORPHONE HCL 1 MG/ML IJ SOLN
0.2500 mg | INTRAMUSCULAR | Status: AC | PRN
  Administered 2019-08-31 (×4): 0.25 mg via INTRAVENOUS

## 2019-08-31 MED ORDER — ADULT MULTIVITAMIN W/MINERALS CH
1.0000 | ORAL_TABLET | Freq: Every day | ORAL | Status: DC
  Administered 2019-08-31 – 2019-09-02 (×3): 1 via ORAL
  Filled 2019-08-31 (×3): qty 1

## 2019-08-31 MED ORDER — ACETAMINOPHEN 10 MG/ML IV SOLN
INTRAVENOUS | Status: DC | PRN
  Administered 2019-08-31: 1000 mg via INTRAVENOUS

## 2019-08-31 MED ORDER — GABAPENTIN 300 MG PO CAPS
ORAL_CAPSULE | ORAL | Status: AC
  Administered 2019-08-31: 07:00:00 300 mg via ORAL
  Filled 2019-08-31: qty 1

## 2019-08-31 MED ORDER — NEOMYCIN-POLYMYXIN B GU 40-200000 IR SOLN
Status: AC
  Filled 2019-08-31: qty 20

## 2019-08-31 SURGICAL SUPPLY — 56 items
BLADE DRUM FLTD (BLADE) ×2
BLADE SAW 90X25X1.19 OSCILLAT (BLADE) ×2
CANISTER SUCT 1200ML W/VALVE (MISCELLANEOUS) ×2
CANISTER SUCT 3000ML PPV (MISCELLANEOUS) ×4
COVER WAND RF STERILE (DRAPES) ×2
CUP ACETBLR 52 OD 100 SERIES (Hips) ×2 IMPLANT
DIFFUSER DRILL AIR PNEUMATIC (MISCELLANEOUS) ×2
DRAPE 3/4 80X56 (DRAPES) ×2
DRAPE INCISE IOBAN 66X60 STRL (DRAPES) ×2
DRSG DERMACEA 8X12 NADH (GAUZE/BANDAGES/DRESSINGS) ×2
DRSG OPSITE POSTOP 4X12 (GAUZE/BANDAGES/DRESSINGS)
DRSG OPSITE POSTOP 4X14 (GAUZE/BANDAGES/DRESSINGS)
DRSG TEGADERM 4X10 (GAUZE/BANDAGES/DRESSINGS) ×4
DRSG TEGADERM 4X4.75 (GAUZE/BANDAGES/DRESSINGS) ×2
DURAPREP 26ML APPLICATOR (WOUND CARE) ×4
ELECT REM PT RETURN 9FT ADLT (ELECTROSURGICAL) ×2
GLOVE BIO SURGEON STRL SZ7.5 (GLOVE) ×6
GLOVE BIOGEL M STRL SZ7.5 (GLOVE) ×4
GLOVE BIOGEL PI IND STRL 7.5 (GLOVE) ×2
GLOVE BIOGEL PI INDICATOR 7.5 (GLOVE) ×2
GLOVE INDICATOR 8.0 STRL GRN (GLOVE) ×2
GOWN STRL REUS W/ TWL LRG LVL3 (GOWN DISPOSABLE) ×2
GOWN STRL REUS W/ TWL XL LVL3 (GOWN DISPOSABLE) ×2
GOWN STRL REUS W/TWL LRG LVL3 (GOWN DISPOSABLE) ×2
GOWN STRL REUS W/TWL XL LVL3 (GOWN DISPOSABLE) ×2
HEMOVAC 400CC 10FR (MISCELLANEOUS) ×2
HOLDER FOLEY CATH W/STRAP (MISCELLANEOUS) ×2
HOOD PEEL AWAY FLYTE STAYCOOL (MISCELLANEOUS) ×4
KIT TURNOVER KIT A (KITS) ×2
LINER MARATHON NEUT +4X52X36 (Hips) ×2 IMPLANT
MANIFOLD NEPTUNE II (INSTRUMENTS) ×2
NDL SAFETY ECLIPSE 18X1.5 (NEEDLE) ×1
NEEDLE HYPO 18GX1.5 SHARP (NEEDLE) ×1
NS IRRIG 500ML POUR BTL (IV SOLUTION) ×2
OIL CARTRIDGE MAESTRO DRILL (MISCELLANEOUS) ×2
PACK HIP PROSTHESIS (MISCELLANEOUS) ×2
PENCIL SMOKE ULTRAEVAC 22 CON (MISCELLANEOUS) ×2
PIN STEIN THRED 5/32 (Pin) ×2 IMPLANT
PULSAVAC PLUS IRRIG FAN TIP (DISPOSABLE) ×2
SOL .9 NS 3000ML IRR  AL (IV SOLUTION) ×1
SOL .9 NS 3000ML IRR UROMATIC (IV SOLUTION) ×1
SOL PREP PVP 2OZ (MISCELLANEOUS) ×2
SPONGE DRAIN TRACH 4X4 STRL 2S (GAUZE/BANDAGES/DRESSINGS) ×2
SROM M HEAD 36MM PLUS 1.5 (Hips) ×2 IMPLANT
STAPLER SKIN PROX 35W (STAPLE) ×2
STEM FEM CMNTLSS SM AML 15.0 (Hips) ×2 IMPLANT
SUT ETHIBOND #5 BRAIDED 30INL (SUTURE) ×2
SUT VIC AB 0 CT1 36 (SUTURE) ×4
SUT VIC AB 1 CT1 36 (SUTURE) ×4
SUT VIC AB 2-0 CT1 27 (SUTURE) ×1
SUT VIC AB 2-0 CT1 TAPERPNT 27 (SUTURE) ×1
SYR 20ML LL LF (SYRINGE) ×2
TAPE CLOTH 3X10 WHT NS LF (GAUZE/BANDAGES/DRESSINGS) ×2
TAPE TRANSPORE STRL 2 31045 (GAUZE/BANDAGES/DRESSINGS) ×2
TOWEL OR 17X26 4PK STRL BLUE (TOWEL DISPOSABLE) ×2
TRAY FOLEY MTR SLVR 16FR STAT (SET/KITS/TRAYS/PACK) ×2

## 2019-08-31 NOTE — TOC Benefit Eligibility Note (Signed)
Transition of Care [facility]) Benefit Eligibility Note    Patient Details  Name: AZANI STOCKARD MRN: [id number] Date of Birth: May 12, 1946   Medication/Dose: Enoxaparin 40mg  once daily for 14 days  Covered?: Yes  Tier: Other(Tier 4)  Prescription Coverage Preferred Pharmacy: [facility]  Spoke with Person/Company/Phone Number:: Eddie Dibbles with [employer] at [phone]  Co-Pay: $90.00 estimated copay  Prior Approval: No  Deductible: (No deductible)   Dannette Barbara Phone Number: [phone] or [phone] 08/31/2019, 3:26 PM

## 2019-08-31 NOTE — Transfer of Care (Signed)
Immediate Anesthesia Transfer of Care Note  Patient: Erin Camacho  Procedure(s) Performed: TOTAL HIP ARTHROPLASTY (Left Hip)  Patient Location: PACU  Anesthesia Type:General  Level of Consciousness: awake, alert  and oriented  Airway & Oxygen Therapy: Patient Spontanous Breathing and Patient connected to nasal cannula oxygen  Post-op Assessment: Report given to RN and Post -op Vital signs reviewed and stable  Post vital signs: Reviewed and stable  Last Vitals:  Vitals Value Taken Time  BP    Temp    Pulse 130 08/31/19 1122  Resp 21 08/31/19 1122  SpO2 96 % 08/31/19 1122  Vitals shown include unvalidated device data.  Last Pain:  Vitals:   08/31/19 0647  TempSrc: Oral  PainSc:          Complications: No apparent anesthesia complications

## 2019-08-31 NOTE — Anesthesia Procedure Notes (Signed)
Procedure Name: Intubation Date/Time: 08/31/2019 7:29 AM Performed by: Bernardo Heater, CRNA Pre-anesthesia Checklist: Patient identified, Patient being monitored, Timeout performed, Emergency Drugs available and Suction available Patient Re-evaluated:Patient Re-evaluated prior to induction Oxygen Delivery Method: Circle system utilized Preoxygenation: Pre-oxygenation with 100% oxygen Induction Type: IV induction Laryngoscope Size: Mac and 3 Grade View: Grade I Tube type: Oral Tube size: 7.0 mm Number of attempts: 1 Placement Confirmation: ETT inserted through vocal cords under direct vision,  positive ETCO2 and breath sounds checked- equal and bilateral Secured at: 21 cm Tube secured with: Tape Dental Injury: Teeth and Oropharynx as per pre-operative assessment

## 2019-08-31 NOTE — Op Note (Signed)
OPERATIVE NOTE  DATE OF SURGERY:  08/31/2019  PATIENT NAME:  Erin Camacho   DOB: [birth year]  MRN: [id number]  PRE-OPERATIVE DIAGNOSIS: Degenerative arthrosis of the left hip, primary  POST-OPERATIVE DIAGNOSIS:  Same  PROCEDURE:  Left total hip arthroplasty  SURGEON:  Marciano Sequin. M.D.  ASSISTANT: Cassell Smiles, PA-C (present and scrubbed throughout the case, critical for assistance with exposure, retraction, instrumentation, and closure)  ANESTHESIA: general  ESTIMATED BLOOD LOSS: 250 mL  FLUIDS REPLACED: 1900 mL of crystalloid  DRAINS: 2 medium drains to a Hemovac reservoir  IMPLANTS UTILIZED: DePuy 15 mm small stature AML femoral stem, 52 mm OD Pinnacle 100 acetabular component, +4 mm neutral Pinnacle Marathon polyethylene insert, and a 36 mm M-SPEC +1.5 mm hip ball  INDICATIONS FOR SURGERY: Erin Camacho is a 73 y.o. year old female with a long history of progressive hip and groin  pain. X-rays demonstrated severe degenerative changes. The patient had not seen any significant improvement despite conservative nonsurgical intervention. After discussion of the risks and benefits of surgical intervention, the patient expressed understanding of the risks benefits and agree with plans for total hip arthroplasty.   The risks, benefits, and alternatives were discussed at length including but not limited to the risks of infection, bleeding, nerve injury, stiffness, blood clots, the need for revision surgery, limb length inequality, dislocation, cardiopulmonary complications, among others, and they were willing to proceed.  PROCEDURE IN DETAIL: The patient was brought into the operating room and, after adequate general anesthesia was achieved, the patient was placed in a right lateral decubitus position. Axillary roll was placed and all bony prominences were well-padded. [name] left hip was cleaned and prepped with alcohol and DuraPrep and draped in the usual sterile fashion. A  "timeout" was performed as per usual protocol. A lateral curvilinear incision was made gently curving towards the posterior superior iliac spine. The IT band was incised in line with the skin incision and the fibers of the gluteus maximus were split in line. The piriformis tendon was identified, skeletonized, and incised at its insertion to the proximal femur and reflected posteriorly. A T type posterior capsulotomy was performed. Prior to dislocation of the femoral head, a threaded Steinmann pin was inserted through a separate stab incision into the pelvis superior to the acetabulum and bent in the form of a stylus so as to assess limb length and hip offset throughout the procedure. The femoral head was then dislocated posteriorly. Inspection of the femoral head demonstrated severe degenerative changes with full-thickness loss of articular cartilage. The femoral neck cut was performed using an oscillating saw. The anterior capsule was elevated off of the femoral neck using a periosteal elevator. Attention was then directed to the acetabulum. The remnant of the labrum was excised using electrocautery. Inspection of the acetabulum also demonstrated significant degenerative changes. The acetabulum was reamed in sequential fashion up to a 51 mm diameter. Good punctate bleeding bone was encountered. A 52 mm Pinnacle 100 acetabular component was positioned and impacted into place. Good scratch fit was appreciated. A +4 mm neutral polyethylene trial was inserted.  Attention was then directed to the proximal femur. A hole for reaming of the proximal femoral canal was created using a high-speed burr. The femoral canal was reamed in sequential fashion up to a 14.5 mm diameter. This allowed for approximately 6 cm of scratch fit.  It was thus elected to ream up to a 50 mm diameter to allow for a line to line  fit.  Serial broaches were inserted up to a 15 mm small stature femoral broach. Calcar region was planed and a trial  reduction was performed using a 36 mm hip ball with a 1.5 mm neck length. Good equalization of limb lengths and hip offset was appreciated and excellent stability was noted both anteriorly and posteriorly. Trial components were removed. The acetabular shell was irrigated with copious amounts of normal saline with antibiotic solution and suctioned dry. A +4 mm neutral Pinnacle Marathon polyethylene insert was positioned and impacted into place. Next, a 15 mm small stature AML femoral stem was positioned and impacted into place. Excellent scratch fit was appreciated. A trial reduction was again performed with a 36 mm hip ball with a +1.5 mm neck length. Again, good equalization of limb lengths was appreciated and excellent stability appreciated both anteriorly and posteriorly. The hip was then dislocated and the trial hip ball was removed. The Morse taper was cleaned and dried. A 36 mm M-SPEC hip ball with a +1.5 mm neck length was placed on the trunnion and impacted into place. The hip was then reduced and placed through range of motion. Excellent stability was appreciated both anteriorly and posteriorly.  The wound was irrigated with copious amounts of normal saline with antibiotic solution and suctioned dry. Good hemostasis was appreciated. The posterior capsulotomy was repaired using #5 Ethibond. Piriformis tendon was reapproximated to the undersurface of the gluteus medius tendon using #5 Ethibond. Two medium drains were placed in the wound bed and brought out through separate stab incisions to be attached to a Hemovac reservoir. The IT band was reapproximated using interrupted sutures of #1 Vicryl. Subcutaneous tissue was approximated using first #0 Vicryl followed by #2-0 Vicryl. The skin was closed with skin staples.  The patient tolerated the procedure well and was transported to the recovery room in stable condition.   Marciano Sequin., M.D.

## 2019-08-31 NOTE — TOC Progression Note (Signed)
Transition of Care [facility]) - Progression Note    Patient Details  Name: Erin Camacho MRN: [id number] Date of Birth: Feb 06, 1946  Transition of Care [facility]) CM/SW Contact  Su Hilt, RN Phone Number: 08/31/2019, 2:50 PM  Clinical Narrative:    Requested the price of Lovenox        Expected Discharge Plan and Services                                                 Social Determinants of Health (SDOH) Interventions    Readmission Risk Interventions No flowsheet data found.

## 2019-08-31 NOTE — Anesthesia Preprocedure Evaluation (Signed)
Anesthesia Evaluation  Patient identified by MRN, date of birth, ID band Patient awake    Reviewed: Allergy & Precautions, NPO status , Patient's Chart, lab work & pertinent test results  History of Anesthesia Complications Negative for: history of anesthetic complications  Airway Mallampati: III  TM Distance: <3 FB Neck ROM: Full    Dental no notable dental hx. (+) Teeth Intact, Dental Advisory Given   Pulmonary shortness of breath and with exertion, pneumonia,    Pulmonary exam normal breath sounds clear to auscultation       Cardiovascular hypertension, Pt. on medications and Pt. on home beta blockers Normal cardiovascular exam Rhythm:Regular Rate:Normal     Neuro/Psych PSYCHIATRIC DISORDERS Anxiety Depression Lumbar spinal stenosis  Neuromuscular disease    GI/Hepatic Neg liver ROS, hiatal hernia, GERD  Medicated,  Endo/Other  diabetes, Type 2, Oral Hypoglycemic Agents  Renal/GU negative Renal ROS  negative genitourinary   Musculoskeletal  (+) Arthritis ,   Abdominal   Peds negative pediatric ROS (+)  Hematology negative hematology ROS (+)   Anesthesia Other Findings Past Medical History: No date: Allergy     Comment:  hay fever No date: Anxiety No date: Arthritis No date: Chronic back pain No date: Diabetes mellitus No date: Dyspnea     Comment:  a. 07/2017 Echo: EF 60-65%. No rwma. Mildly dil LA. Nl RV              fxn. No date: GERD (gastroesophageal reflux disease) No date: History of hiatal hernia No date: Hyperlipidemia No date: Hypertension No date: Osteoarthritis No date: Pneumonia No date: Spinal stenosis No date: UTI (lower urinary tract infection)  Reproductive/Obstetrics                             Anesthesia Physical  Anesthesia Plan  ASA: III  Anesthesia Plan: General   Post-op Pain Management:    Induction: Intravenous  PONV Risk Score and Plan: 3  and Ondansetron, Dexamethasone, Treatment may vary due to age or medical condition and Midazolam  Airway Management Planned: Oral ETT  Additional Equipment:   Intra-op Plan:   Post-operative Plan: Extubation in OR  Informed Consent: I have reviewed the patients History and Physical, chart, labs and discussed the procedure including the risks, benefits and alternatives for the proposed anesthesia with the patient or authorized representative who has indicated his/her understanding and acceptance.     Dental advisory given  Plan Discussed with: CRNA and Surgeon  Anesthesia Plan Comments:         Anesthesia Quick Evaluation

## 2019-08-31 NOTE — Anesthesia Post-op Follow-up Note (Signed)
Anesthesia QCDR form completed.        

## 2019-08-31 NOTE — Telephone Encounter (Signed)
Patient just had the surgery today. Will remove this cardiac clearance request from pool

## 2019-08-31 NOTE — Evaluation (Signed)
Physical Therapy Evaluation Patient Details Name: Erin Camacho MRN: [id number] DOB: [birth year] Today's Date: 08/31/2019   History of Present Illness  Pt is a 73 y/o F admitted s/p L THA. PMH includes HTN, HLD, DM, GERD, anxiety, OA, dyspnea with exertion  Clinical Impression  Pt is a pleasant 73 year old F who was admitted s/p L THA. Upon entry, pt is resting in bed, states pain is 3/10 on NPS. Pt performs bed mobility CGA, transfers with min A, and ambulation with CGA and use of RW. Pt demonstrates deficits with strength/ROM/mobility/pain management. Pt currently relies significantly on UE support from RW. Pt will benefit from skilled PT to address above deficits; current follow-up care recommendation is HHPT.      Follow Up Recommendations Home health PT    Equipment Recommendations  Rolling walker with 5" wheels    Recommendations for Other Services       Precautions / Restrictions Precautions Precautions: Posterior Hip Precaution Booklet Issued: No Restrictions Weight Bearing Restrictions: Yes LLE Weight Bearing: Weight bearing as tolerated      Mobility  Bed Mobility Overal bed mobility: Needs Assistance Bed Mobility: Supine to Sit     Supine to sit: Min assist     General bed mobility comments: min A of L LE; pt instructed how to perform bed mobility without violating precautions  Transfers Overall transfer level: Needs assistance Equipment used: Rolling walker (2 wheeled) Transfers: Sit to/from Stand Sit to Stand: Min guard         General transfer comment: heavy reliance on UE support  Ambulation/Gait Ambulation/Gait assistance: Min guard Gait Distance (Feet): 5 Feet Assistive device: Rolling walker (2 wheeled) Gait Pattern/deviations: Step-to pattern;Antalgic     General Gait Details: Pt able to amb to recliner with step-to and antalgic pattern. CGA provided by PT. Reduced Gait speed present.   Stairs            Wheelchair Mobility     Modified Rankin (Stroke Patients Only)       Balance Overall balance assessment: Needs assistance Sitting-balance support: Feet supported Sitting balance-Leahy Scale: Good Sitting balance - Comments: Able to maintain upright posture   Standing balance support: Bilateral upper extremity supported Standing balance-Leahy Scale: Good Standing balance comment: Able to maintain upright posture                             Pertinent Vitals/Pain Pain Assessment: 0-10 Pain Score: 3  Pain Location: L Hip Pain Descriptors / Indicators: Aching;Dull;Grimacing;Guarding Pain Intervention(s): Limited activity within patient's tolerance;Monitored during session;Ice applied;Repositioned    Home Living Family/patient expects to be discharged to:: Private residence Living Arrangements: Spouse/significant other Available Help at Discharge: Family;Available 24 hours/day Type of Home: House Home Access: Stairs to enter(short landing step) Entrance Stairs-Rails: None Entrance Stairs-Number of Steps: 1 Home Layout: One level Home Equipment: Walker - standard;Cane - single point;Grab bars - tub/shower      Prior Function Level of Independence: Independent with assistive device(s)         Comments: pt uses SPC to amb around home; reports to be independent in all ADL's prior to surgery     Hand Dominance        Extremity/Trunk Assessment   Upper Extremity Assessment Upper Extremity Assessment: Overall WFL for tasks assessed    Lower Extremity Assessment Lower Extremity Assessment: LLE deficits/detail LLE Deficits / Details: s/p L THA    Cervical / Trunk Assessment Cervical /  Trunk Assessment: Kyphotic  Communication   Communication: No difficulties  Cognition Arousal/Alertness: Awake/alert Behavior During Therapy: WFL for tasks assessed/performed Overall Cognitive Status: Within Functional Limits for tasks assessed                                         General Comments      Exercises Other Exercises Other Exercises: Supine, 10 reps, bilat: AP, QS, GS, hip ad/ab; PT supervision, CGA for ad/ab to maintain precautions   Assessment/Plan    PT Assessment Patient needs continued PT services  PT Problem List Decreased strength;Decreased range of motion;Decreased balance;Decreased mobility;Pain;Decreased coordination       PT Treatment Interventions DME instruction;Gait training;Stair training;Functional mobility training;Therapeutic activities;Therapeutic exercise;Balance training;Patient/family education    PT Goals (Current goals can be found in the Care Plan section)  Acute Rehab PT Goals Patient Stated Goal: to return home PT Goal Formulation: With patient Time For Goal Achievement: 09/14/19 Potential to Achieve Goals: Good    Frequency BID   Barriers to discharge        Co-evaluation               AM-PAC PT "6 Clicks" Mobility  Outcome Measure Help needed turning from your back to your side while in a flat bed without using bedrails?: None Help needed moving from lying on your back to sitting on the side of a flat bed without using bedrails?: A Little Help needed moving to and from a bed to a chair (including a wheelchair)?: A Little Help needed standing up from a chair using your arms (e.g., wheelchair or bedside chair)?: A Little Help needed to walk in hospital room?: A Little Help needed climbing 3-5 steps with a railing? : A Little 6 Click Score: 19    End of Session Equipment Utilized During Treatment: Gait belt Activity Tolerance: Patient tolerated treatment well Patient left: in chair;with call bell/phone within reach;with chair alarm set;with SCD's reapplied;with family/visitor present Nurse Communication: Mobility status PT Visit Diagnosis: Muscle weakness (generalized) (M62.81);Difficulty in walking, not elsewhere classified (R26.2);Pain Pain - Right/Left: Left Pain - part of body: Hip     Time: [id number] PT Time Calculation (min) (ACUTE ONLY): 40 min   Charges:   PT Evaluation $PT Eval Low Complexity: 1 Low PT Treatments $Therapeutic Exercise: 23-37 mins        Tyresha Fede, SPT   Yarieliz Wasser 08/31/2019, 4:49 PM

## 2019-08-31 NOTE — H&P (Signed)
The patient has been re-examined, and the chart reviewed, and there have been no interval changes to the documented history and physical.    The risks, benefits, and alternatives have been discussed at length. The patient expressed understanding of the risks benefits and agreed with plans for surgical intervention.  James P. Hooten, [name] M.D.    

## 2019-09-01 LAB — GLUCOSE, CAPILLARY
Glucose-Capillary: 92 mg/dL (ref 70–99)
Glucose-Capillary: 100 mg/dL — ABNORMAL HIGH (ref 70–99)
Glucose-Capillary: 115 mg/dL — ABNORMAL HIGH (ref 70–99)
Glucose-Capillary: 126 mg/dL — ABNORMAL HIGH (ref 70–99)

## 2019-09-01 NOTE — Progress Notes (Signed)
Physical Therapy Treatment Patient Details Name: Erin Camacho MRN: [id number] DOB: 05-29-1946 Today's Date: 09/01/2019    History of Present Illness Pt is a 74 y/o F admitted s/p L THA. PMH includes HTN, HLD, DM, GERD, anxiety, OA, dyspnea with exertion    PT Comments    Pt is making good progress towards goals with improved ambulation distance this session. Reports increased pain, RN notified that she is requesting meds. Good endurance with there-ex. Able to recall 1/3 hip precautions, educated. Will continue to progress.   Follow Up Recommendations  Home health PT     Equipment Recommendations  Rolling walker with 5" wheels    Recommendations for Other Services       Precautions / Restrictions Precautions Precautions: Posterior Hip Precaution Booklet Issued: Yes (comment) Restrictions Weight Bearing Restrictions: Yes LLE Weight Bearing: Weight bearing as tolerated    Mobility  Bed Mobility Overal bed mobility: Needs Assistance Bed Mobility: Supine to Sit     Supine to sit: Min assist     General bed mobility comments: needs cues for sequencing. Effortful. Needs assist for L LE  Transfers Overall transfer level: Needs assistance Equipment used: Rolling walker (2 wheeled) Transfers: Sit to/from Stand Sit to Stand: Min guard         General transfer comment: cues to maintain hip precautions. Once standing, upright posture noted  Ambulation/Gait Ambulation/Gait assistance: Min guard Gait Distance (Feet): 70 Feet Assistive device: Rolling walker (2 wheeled) Gait Pattern/deviations: Step-through pattern     General Gait Details: ambulated with improved reciprocal gait pattern and symmetrical steps. Slight fatigue   Stairs             Wheelchair Mobility    Modified Rankin (Stroke Patients Only)       Balance Overall balance assessment: Needs assistance Sitting-balance support: Feet supported Sitting balance-Leahy Scale: Good Sitting  balance - Comments: Able to maintain upright posture   Standing balance support: Bilateral upper extremity supported Standing balance-Leahy Scale: Good                              Cognition Arousal/Alertness: Awake/alert Behavior During Therapy: WFL for tasks assessed/performed Overall Cognitive Status: Within Functional Limits for tasks assessed                                        Exercises Other Exercises Other Exercises: Supine ther-ex performed on L LE including AP, QS, glut squeezes, SAQ, and hip abd/add. 12 reps with cga    General Comments        Pertinent Vitals/Pain Pain Assessment: 0-10 Pain Score: 5  Pain Location: L Hip Pain Descriptors / Indicators: Aching;Dull;Grimacing;Guarding Pain Intervention(s): Limited activity within patient's tolerance;Ice applied    Home Living                      Prior Function            PT Goals (current goals can now be found in the care plan section) Acute Rehab PT Goals Patient Stated Goal: to return home PT Goal Formulation: With patient Time For Goal Achievement: 09/14/19 Potential to Achieve Goals: Good Progress towards PT goals: Progressing toward goals    Frequency    BID      PT Plan Current plan remains appropriate    Co-evaluation  AM-PAC PT "6 Clicks" Mobility   Outcome Measure  Help needed turning from your back to your side while in a flat bed without using bedrails?: None Help needed moving from lying on your back to sitting on the side of a flat bed without using bedrails?: A Little Help needed moving to and from a bed to a chair (including a wheelchair)?: A Little Help needed standing up from a chair using your arms (e.g., wheelchair or bedside chair)?: A Little Help needed to walk in hospital room?: A Little Help needed climbing 3-5 steps with a railing? : A Little 6 Click Score: 19    End of Session Equipment Utilized During  Treatment: Gait belt Activity Tolerance: Patient tolerated treatment well Patient left: in chair;with call bell/phone within reach;with chair alarm set;with SCD's reapplied;with family/visitor present Nurse Communication: Mobility status PT Visit Diagnosis: Muscle weakness (generalized) (M62.81);Difficulty in walking, not elsewhere classified (R26.2);Pain Pain - Right/Left: Left Pain - part of body: Hip     Time: [id number] PT Time Calculation (min) (ACUTE ONLY): 24 min  Charges:  $Gait Training: 8-22 mins $Therapeutic Exercise: 8-22 mins                     Erin Camacho, PT, DPT [phone]    Erin Camacho 09/01/2019, 10:16 AM

## 2019-09-01 NOTE — Discharge Instructions (Signed)
Instructions after Total Hip Replacement     Erin Camacho [name], [name], M.D.     Dept. of Orthopaedics & Sports Medicine  [facility]  [address]  [place], [place]  [address]  Phone: [phone]   Fax: [phone]    DIET: . Drink plenty of non-alcoholic fluids. . Resume your normal diet. Include foods high in fiber.  ACTIVITY:  . You may use crutches or a walker with weight-bearing as tolerated, unless instructed otherwise. . You may be weaned off of the walker or crutches by your Physical Therapist.  . Do NOT reach below the level of your knees or cross your legs until allowed.    . Continue doing gentle exercises. Exercising will reduce the pain and swelling, increase motion, and prevent muscle weakness.   . Please continue to use the TED compression stockings for 6 weeks. You may remove the stockings at night, but should reapply them in the morning. . Do not drive or operate any equipment until instructed.  WOUND CARE:  . Continue to use ice packs periodically to reduce pain and swelling. . Keep the incision clean and dry. . You may bathe or shower after the staples are removed at the first office visit following surgery.  MEDICATIONS: . You may resume your regular medications. . Please take the pain medication as prescribed on the medication. . Do not take pain medication on an empty stomach. . You have been given a prescription for a blood thinner to prevent blood clots. Please take the medication as instructed. (NOTE: After completing a 2 week course of Lovenox, take one Enteric-coated aspirin once a day.) . Pain medications and iron supplements can cause constipation. Use a stool softener (Senokot or Colace) on a daily basis and a laxative (dulcolax or miralax) as needed. . Do not drive or drink alcoholic beverages when taking pain medications.  CALL THE OFFICE FOR: . Temperature above 101 degrees . Excessive bleeding or drainage on the dressing. . Excessive  swelling, coldness, or paleness of the toes. . Persistent nausea and vomiting.  FOLLOW-UP:  . You should have an appointment to return to the office in 6 weeks after surgery. . Arrangements have been made for continuation of Physical Therapy (either home therapy or outpatient therapy).     [facility] Department Directory         [email or web address]       [email or web address]          Cardiology  Appointments: [place] - [phone] [place] - [phone]  Endocrinology  Appointments: [place] - [phone] [place] - [phone]  Gastroenterology  Appointments: [place] - [phone] [place] - [phone]        General Surgery   Appointments: [place] - [phone]  Internal Medicine/Family Medicine  Appointments: [place] - [phone] [place] - [phone] [place] - [phone]  Metabolic and Weigh Loss Surgery  Appointments: [place] - [phone]        Neurology  Appointments: [place] - [phone] [place] - [phone]  Neurosurgery  Appointments: [place] - [phone]  Obstetrics & Gynecology  Appointments: [place] - [phone] [place] - [phone]        Pediatrics  Appointments: [place] - [phone] [place] - [phone]  Physiatry  Appointments: [place] -[phone]  Physical Therapy  Appointments: [place] - [phone] [place] - [phone]        Podiatry  Appointments: [place] - [phone] [place] - [phone]  Pulmonology  Appointments: [place] - [phone]  Rheumatology  Appointments: [place] - [phone]        [place] Location: [facility]  [address] [place], [place]  [address]  [place] Location: [facility] [address] [place], [place]  [address]  [place] Location: [facility] [address] [place], [place]  [address]    

## 2019-09-01 NOTE — Anesthesia Postprocedure Evaluation (Signed)
Anesthesia Post Note  Patient: Erin Camacho  Procedure(s) Performed: TOTAL HIP ARTHROPLASTY (Left Hip)  Patient location during evaluation: PACU Anesthesia Type: General Level of consciousness: awake and alert and oriented Pain management: pain level controlled Vital Signs Assessment: post-procedure vital signs reviewed and stable Respiratory status: spontaneous breathing Cardiovascular status: blood pressure returned to baseline Anesthetic complications: no     Last Vitals:  Vitals:   09/01/19 0543 09/01/19 0759  BP: (!) 122/51 (!) 142/68  Pulse: 72 64  Resp: 16 17  Temp: 36.6 C 36.8 C  SpO2: 92% 96%    Last Pain:  Vitals:   09/01/19 0855  TempSrc:   PainSc: 3                  Amarys Sliwinski

## 2019-09-01 NOTE — Evaluation (Signed)
Occupational Therapy Evaluation Patient Details Name: Erin Camacho MRN: [id number] DOB: 15-Apr-1946 Today's Date: 09/01/2019    History of Present Illness Pt is a 73 y/o F admitted s/p L THA. PMH includes HTN, HLD, DM, GERD, anxiety, OA, dyspnea with exertion   Clinical Impression   Pt is 74 year old female s/p L THA (posterior)  who lives at home with her husband. Pt was independent in all ADLs prior to surgery and is eager to return to PLOF. She had R hip surgery in the past and has 2 reachers at home but does not have a sock aid, LH shoe horn or LH sponge for bathing.  She thinks she might have a BSC at home to use over toilet.  Reviewed precautions with patient and she was able to recall 2/3.  Pt is currently limited in functional ADLs due to pain, decreased ROM, .  Pt requires min assist for LB dressing and bathing skills due to pain and decreased AROM of L LE  and would benefit from continued skilled OT services for education in assistive devices, functional mobility, and education in recommendations for home modifications to increase safety and prevent falls.  Pt is a good candidate for OT HH to continue rehabilitation.      Follow Up Recommendations  Home health OT    Equipment Recommendations  Other (comment)(sock aid and LH shoe horn)    Recommendations for Other Services       Precautions / Restrictions Precautions Precautions: Posterior Hip Precaution Booklet Issued: Yes (comment) Restrictions Weight Bearing Restrictions: Yes LLE Weight Bearing: Weight bearing as tolerated      Mobility Bed Mobility Overal bed mobility: Needs Assistance Bed Mobility: Supine to Sit     Supine to sit: Min assist     General bed mobility comments: needs cues for sequencing. Effortful. Needs assist for L LE  Transfers Overall transfer level: Needs assistance Equipment used: Rolling walker (2 wheeled) Transfers: Sit to/from Stand Sit to Stand: Min guard         General  transfer comment: cues to maintain hip precautions. Once standing, upright posture noted    Balance Overall balance assessment: Needs assistance Sitting-balance support: Feet supported Sitting balance-Leahy Scale: Good Sitting balance - Comments: Able to maintain upright posture   Standing balance support: Bilateral upper extremity supported Standing balance-Leahy Scale: Good                             ADL either performed or assessed with clinical judgement   ADL Overall ADL's : Needs assistance/impaired Eating/Feeding: Independent;Set up   Grooming: Wash/dry hands;Wash/dry face;Oral care;Set up;Brushing hair;Sitting;Independent   Upper Body Bathing: Independent;Set up;Sitting   Lower Body Bathing: Set up;Moderate assistance   Upper Body Dressing : Independent;Supervision/safety   Lower Body Dressing: Set up;With adaptive equipment;Minimal assistance;Sit to/from stand Lower Body Dressing Details (indicate cue type and reason): Min assist for using AD for LB dressing and min mod assist when standing for pants over hips due to pain 7/10.             Functional mobility during ADLs: Rolling walker;Min guard General ADL Comments: Pt has 2 reachers at home and might have a BSC but does not have a LH shoe horn or sock aid which she would benefit from.     Vision Baseline Vision/History: No visual deficits Patient Visual Report: No change from baseline       Perception  Praxis      Pertinent Vitals/Pain Pain Assessment: 0-10 Pain Score: 7  Pain Location: L Hip Pain Descriptors / Indicators: Aching;Dull;Grimacing;Guarding Pain Intervention(s): Limited activity within patient's tolerance;Monitored during session;Premedicated before session;Repositioned     Hand Dominance Right   Extremity/Trunk Assessment Upper Extremity Assessment Upper Extremity Assessment: Overall WFL for tasks assessed   Lower Extremity Assessment Lower Extremity Assessment:  Defer to PT evaluation   Cervical / Trunk Assessment Cervical / Trunk Assessment: Kyphotic   Communication Communication Communication: No difficulties   Cognition Arousal/Alertness: Awake/alert Behavior During Therapy: WFL for tasks assessed/performed Overall Cognitive Status: Within Functional Limits for tasks assessed                                     General Comments       Exercises Other Exercises Other Exercises: Supine ther-ex performed on L LE including AP, QS, glut squeezes, SAQ, and hip abd/add. 12 reps with cga   Shoulder Instructions      Home Living Family/patient expects to be discharged to:: Private residence Living Arrangements: Spouse/significant other Available Help at Discharge: Family;Available 24 hours/day Type of Home: House Home Access: Stairs to enter [employer] of Steps: 1 Entrance Stairs-Rails: None Home Layout: One level     Bathroom Shower/Tub: Occupational psychologist: Handicapped height Bathroom Accessibility: Yes   Home Equipment: Financial controller: Reacher        Prior Functioning/Environment Level of Independence: Independent with assistive device(s)        Comments: pt uses SPC to amb around home; reports to be independent in all ADL's prior to surgery.  Pt stated she might have a BSC at home to use over toilet.        OT Problem List: Decreased strength;Decreased range of motion;Decreased activity tolerance;Pain;Impaired balance (sitting and/or standing)      OT Treatment/Interventions: Self-care/ADL training;Balance training;DME and/or AE instruction;Therapeutic activities;Patient/family education    OT Goals(Current goals can be found in the care plan section) Acute Rehab OT Goals Patient Stated Goal: to regain independence to take care of myself OT Goal Formulation: With patient Time For Goal Achievement: 09/15/19 Potential to Achieve Goals: Good  OT Frequency:  Min 2X/week   Barriers to D/C:            Co-evaluation              AM-PAC OT "6 Clicks" Daily Activity     Outcome Measure Help from another person eating meals?: None Help from another person taking care of personal grooming?: None Help from another person toileting, which includes using toliet, bedpan, or urinal?: A Little Help from another person bathing (including washing, rinsing, drying)?: A Little Help from another person to put on and taking off regular upper body clothing?: None Help from another person to put on and taking off regular lower body clothing?: A Little 6 Click Score: 21   End of Session Equipment Utilized During Treatment: Gait belt  Activity Tolerance: Patient tolerated treatment well Patient left: in chair;with call bell/phone within reach;with chair alarm set  OT Visit Diagnosis: Unsteadiness on feet (R26.81);Pain;Muscle weakness (generalized) (M62.81) Pain - Right/Left: Left Pain - part of body: Hip                Time: 1020-1045 OT Time Calculation (min): 25 min Charges:  OT General Charges $OT Visit: 1 Visit OT Evaluation $OT Eval  Low Complexity: 1 Low OT Treatments $Self Care/Home Management : 8-22 mins  Chrys Racer, OTR/L, [place] ascom [phone] 09/01/19, 11:20 AM

## 2019-09-01 NOTE — Progress Notes (Signed)
Physical Therapy Treatment Patient Details Name: Erin Camacho MRN: [id number] DOB: Sep 08, 1946 Today's Date: 09/01/2019    History of Present Illness Pt is a 73 y/o F admitted s/p L THA. PMH includes HTN, HLD, DM, GERD, anxiety, OA, dyspnea with exertion    PT Comments    Pt is progressing toward goals. Upon entry, pt is resting in bed stating she still has not had pain meds and is 7/10 pain on NPS. Pt is able to recall hip flexion precautions, requires hints for other two precautions. This date, pt performs bed mobility and transfers with min assist/CGA. Ambulation performed with use of RW and CGA; able to increase amb distance successfully. Pt will continue to benefit from skilled PT to address deficits in strength/ROM/mobility/pain management.     Follow Up Recommendations  Home health PT     Equipment Recommendations  Rolling walker with 5" wheels    Recommendations for Other Services       Precautions / Restrictions Precautions Precautions: Posterior Hip Precaution Booklet Issued: Yes (comment) Restrictions Weight Bearing Restrictions: Yes LLE Weight Bearing: Weight bearing as tolerated    Mobility  Bed Mobility Overal bed mobility: Needs Assistance Bed Mobility: Supine to Sit     Supine to sit: Min assist     General bed mobility comments: Needs minimal assist for L LE; familiar with sequencing  Transfers Overall transfer level: Needs assistance Equipment used: Rolling walker (2 wheeled) Transfers: Sit to/from Stand Sit to Stand: Min guard         General transfer comment: Pt able to perform while maintaining hip precautions; needed reminder when performing toilet transfers  Ambulation/Gait Ambulation/Gait assistance: Min guard Gait Distance (Feet): 200 Feet Assistive device: Rolling walker (2 wheeled) Gait Pattern/deviations: Step-through pattern     General Gait Details: amb with step-through gait pattern and symmetrical steps; pt displays slight  antalgic pattern   Stairs             Wheelchair Mobility    Modified Rankin (Stroke Patients Only)       Balance Overall balance assessment: Needs assistance Sitting-balance support: Feet supported Sitting balance-Leahy Scale: Good Sitting balance - Comments: Able to maintain upright posture   Standing balance support: Bilateral upper extremity supported Standing balance-Leahy Scale: Good Standing balance comment: Able to maintain upright posture                            Cognition Arousal/Alertness: Awake/alert Behavior During Therapy: WFL for tasks assessed/performed Overall Cognitive Status: Within Functional Limits for tasks assessed                                        Exercises Other Exercises Other Exercises: Supine ther-ex performed on L LE including AP, QS, glut squeezes, SAQ, and hip abd/add. 12 reps with PT supervision Other Exercises: Pt performs toileting activities independent with min assist and vc for toilet transfers    General Comments        Pertinent Vitals/Pain Pain Assessment: 0-10 Pain Score: 7  Pain Location: L Hip Pain Descriptors / Indicators: Aching;Dull;Grimacing;Guarding Pain Intervention(s): Limited activity within patient's tolerance;Monitored during session    Home Living                      Prior Function  PT Goals (current goals can now be found in the care plan section) Acute Rehab PT Goals Patient Stated Goal: to regain independence to take care of myself PT Goal Formulation: With patient Time For Goal Achievement: 09/14/19 Potential to Achieve Goals: Good Progress towards PT goals: Progressing toward goals    Frequency    BID      PT Plan Current plan remains appropriate    Co-evaluation              AM-PAC PT "6 Clicks" Mobility   Outcome Measure  Help needed turning from your back to your side while in a flat bed without using bedrails?:  None Help needed moving from lying on your back to sitting on the side of a flat bed without using bedrails?: A Little Help needed moving to and from a bed to a chair (including a wheelchair)?: A Little Help needed standing up from a chair using your arms (e.g., wheelchair or bedside chair)?: A Little Help needed to walk in hospital room?: A Little Help needed climbing 3-5 steps with a railing? : A Little 6 Click Score: 19    End of Session Equipment Utilized During Treatment: Gait belt Activity Tolerance: Patient tolerated treatment well Patient left: in bed;with call bell/phone within reach;with bed alarm set;with SCD's reapplied Nurse Communication: Mobility status PT Visit Diagnosis: Muscle weakness (generalized) (M62.81);Difficulty in walking, not elsewhere classified (R26.2);Pain Pain - Right/Left: Left Pain - part of body: Hip     Time: [id number] PT Time Calculation (min) (ACUTE ONLY): 25 min  Charges:  $Gait Training: 8-22 mins $Therapeutic Exercise: 8-22 mins                     Rielly Corlett, SPT    Breslin Burklow 09/01/2019, 5:00 PM

## 2019-09-01 NOTE — Progress Notes (Signed)
ORTHOPAEDICS PROGRESS NOTE  PATIENT NAME: Erin Camacho DOB: [birth year]  MRN: [id number]  POD # 1: Left total hip arthroplasty  Subjective: The patient rested well last night.  She denies any nausea or vomiting.  Pain has been under good control.  Objective: Vital signs in last 24 hours: Temp:  [97.7 F (36.5 C)-98.7 F (37.1 C)] 97.8 F (36.6 C) (10/13 0543) Pulse Rate:  [72-130] 72 (10/13 0543) Resp:  [12-28] 16 (10/13 0543) BP: (114-140)/(51-78) 122/51 (10/13 0543) SpO2:  [92 %-100 %] 92 % (10/13 0543) Weight:  [38.3 kg] 38.3 kg (10/12 1432)  Intake/Output from previous day: 10/12 0701 - 10/13 0700 In: 3938.4 [P.O.:120; I.V.:3108.4; IV Piggyback:710] Out: 2220 [Urine:1940; Drains:30; Blood:250]  No results for input(s): WBC, HGB, HCT, PLT, K, CL, CO2, BUN, CREATININE, GLUCOSE, CALCIUM, LABPT, INR in the last 72 hours.  EXAM General: Well-developed well-nourished female seen in no apparent discomfort. Lungs: clear to auscultation Cardiac: normal rate and regular rhythm Abdomen: Soft, nontender, nondistended.  Active bowel sounds are noted. Left lower extremity: Left hip dressing is dry and intact.  No significant erythema or ecchymosis.  Hemovac drain is in place and functional.  Homans test is negative. Neurologic: Awake, alert, and oriented.  Sensory and motor function are intact.  Assessment: Left total hip arthroplasty  Secondary diagnoses: Spinal stenosis Hypertension Hyperlipidemia Gastroesophageal reflux disease Diabetes Anxiety  Plan: Notes from physical therapy were reviewed.  The patient did well on the day of surgery. Continue with physical therapy and Occupational Therapy as per total hip arthroplasty rehab protocol. Posterior hip precautions are to be followed.  The patient may be weightbearing as tolerated. Continue glucose monitoring. Plan is to go Home after hospital stay. DVT Prophylaxis - Lovenox, Foot Pumps and TED hose  James P. Holley Bouche  M.D.

## 2019-09-02 DIAGNOSIS — J301 Allergic rhinitis due to pollen: Secondary | ICD-10-CM | POA: Diagnosis not present

## 2019-09-02 LAB — GLUCOSE, CAPILLARY
Glucose-Capillary: 122 mg/dL — ABNORMAL HIGH (ref 70–99)
Glucose-Capillary: 129 mg/dL — ABNORMAL HIGH (ref 70–99)

## 2019-09-02 LAB — SURGICAL PATHOLOGY

## 2019-09-02 MED ORDER — CELECOXIB 200 MG PO CAPS: 200.0000 mg | ORAL_CAPSULE | Freq: Two times a day (BID) | ORAL | 0 refills | Status: AC

## 2019-09-02 MED ORDER — OXYCODONE HCL 5 MG PO TABS: 5.0000 mg | ORAL_TABLET | ORAL | 0 refills | Status: DC | PRN

## 2019-09-02 MED ORDER — ENOXAPARIN SODIUM 40 MG/0.4ML ~~LOC~~ SOLN: 40.0000 mg | SUBCUTANEOUS | 0 refills | Status: DC

## 2019-09-02 NOTE — Progress Notes (Signed)
Physical Therapy Treatment Patient Details Name: Erin Camacho MRN: [id number] DOB: [birth year] Today's Date: 09/02/2019    History of Present Illness Pt is a 73 y/o F admitted s/p L THA. PMH includes HTN, HLD, DM, GERD, anxiety, OA, dyspnea with exertion    PT Comments    Pt is progressing toward goals. Upon entry, pt is resting in recliner stating 4/10 pain. This date, pt performs bed mobility with min assist for L LE, transfers/ambulation with performed with PT supervision and use of RW. Pt able to perform environmentally congruent step safely with CGA from PT Pt will continue to benefit from skilled PT to address deficits in strength/rom/mobility/pain management.     Follow Up Recommendations  Home health PT     Equipment Recommendations  Rolling walker with 5" wheels    Recommendations for Other Services       Precautions / Restrictions Precautions Precautions: Posterior Hip Precaution Booklet Issued: Yes (comment) Restrictions Weight Bearing Restrictions: Yes LLE Weight Bearing: Weight bearing as tolerated    Mobility  Bed Mobility Overal bed mobility: Needs Assistance Bed Mobility: Sit to Supine     Supine to sit: Min assist     General bed mobility comments: Min assist to bring L LE up to bed at conclusion of session  Transfers Overall transfer level: Needs assistance Equipment used: Rolling walker (2 wheeled) Transfers: Sit to/from Stand Sit to Stand: Supervision         General transfer comment: Pt able to perform transfers while maintaining hip precautions  Ambulation/Gait Ambulation/Gait assistance: Supervision Gait Distance (Feet): 200 Feet Assistive device: Rolling walker (2 wheeled) Gait Pattern/deviations: Step-through pattern     General Gait Details: Pt amb with step through, symmetrical pattern   Stairs Stairs: Yes Stairs assistance: Min guard Stair Management: With walker;Forwards Number of Stairs: 1 General stair comments: Pt  able to ascend/descend long step safely using RW; practice stair is taller than pt's environment. PT CGA provided and demonstration prior to attempt   Wheelchair Mobility    Modified Rankin (Stroke Patients Only)       Balance Overall balance assessment: Needs assistance Sitting-balance support: Feet supported Sitting balance-Leahy Scale: Good Sitting balance - Comments: Able to maintain upright posture   Standing balance support: Bilateral upper extremity supported Standing balance-Leahy Scale: Good Standing balance comment: Able to maintain upright posture                            Cognition Arousal/Alertness: Awake/alert Behavior During Therapy: WFL for tasks assessed/performed Overall Cognitive Status: Within Functional Limits for tasks assessed                                        Exercises Other Exercises Other Exercises: Standing march 10 bilat Other Exercises: Pt performs toileting activities independent with supervision for toilet transfers    General Comments        Pertinent Vitals/Pain Pain Assessment: 0-10 Pain Score: 4  Pain Location: L Hip Pain Descriptors / Indicators: Aching;Dull;Grimacing;Guarding    Home Living                      Prior Function            PT Goals (current goals can now be found in the care plan section) Acute Rehab PT Goals Patient Stated Goal: to  regain independence to take care of myself PT Goal Formulation: With patient Time For Goal Achievement: 09/14/19 Potential to Achieve Goals: Good Progress towards PT goals: Progressing toward goals    Frequency    BID      PT Plan Current plan remains appropriate    Co-evaluation              AM-PAC PT "6 Clicks" Mobility   Outcome Measure  Help needed turning from your back to your side while in a flat bed without using bedrails?: None Help needed moving from lying on your back to sitting on the side of a flat bed  without using bedrails?: A Little Help needed moving to and from a bed to a chair (including a wheelchair)?: A Little Help needed standing up from a chair using your arms (e.g., wheelchair or bedside chair)?: A Little Help needed to walk in hospital room?: A Little Help needed climbing 3-5 steps with a railing? : A Little 6 Click Score: 19    End of Session Equipment Utilized During Treatment: Gait belt Activity Tolerance: Patient tolerated treatment well Patient left: in bed;with call bell/phone within reach;with bed alarm set;with SCD's reapplied Nurse Communication: Mobility status PT Visit Diagnosis: Muscle weakness (generalized) (M62.81);Difficulty in walking, not elsewhere classified (R26.2);Pain Pain - Right/Left: Left Pain - part of body: Hip     Time: 0925-0952 PT Time Calculation (min) (ACUTE ONLY): 27 min  Charges:                        Dixie Dials, SPT   Sheral Pfahler 09/02/2019, 11:19 AM

## 2019-09-02 NOTE — TOC Transition Note (Signed)
Transition of Care [facility]) - CM/SW Discharge Note   Patient Details  Name: ANGELISE ZBOROWSKI MRN: [id number] Date of Birth: March 10, 1946  Transition of Care [facility] CM/SW Contact:  Su Hilt, RN Phone Number: 09/02/2019, 11:36 AM   Clinical Narrative:     Patient to DC home with her husband. She is set up with [facility], Accepted by [facility] for [facility] PT She does not need DME She was provided with the cost of Lovenox at $90  Husband is in the room and ready to transport home Final next level of care: [place] Barriers to Discharge: Barriers Resolved   Patient Goals and CMS Choice Patient states their goals for this hospitalization and ongoing recovery are:: go home CMS Medicare.gov Compare Post Acute Care list provided to:: Patient Choice offered to / list presented to : Patient  Discharge Placement                       Discharge Plan and Services   Discharge Planning Services: CM Consult Post Acute Care Choice: Home Health          DME Arranged: Patient refused services         HH Arranged: PT [place] Agency: [facility] (formerly [employer]) Date [place]: 09/02/19 Time [place]: [place] Representative spoke with at [place]: [place] ([place]) Interventions     Readmission Risk Interventions No flowsheet data found.

## 2019-09-02 NOTE — Progress Notes (Signed)
Discharge instructions given to pt and husband. Pt verbalized understanding of instructions. Discharge packet given to pt

## 2019-09-02 NOTE — Discharge Summary (Signed)
Physician Discharge Summary  Patient ID: Erin Camacho MRN: [id number] DOB/AGE: [birth year] 73 y.o.  Admit date: 08/31/2019 Discharge date: 09/02/2019  Admission Diagnoses:  PRIMARY OSTEOARTHRITIS OF LEFT HIP  Surgeries: PROCEDURE:  Left total hip arthroplasty  SURGEON:  Marciano Sequin. M.D.  ASSISTANT: Cassell Smiles, PA-C (present and scrubbed throughout the case, critical for assistance with exposure, retraction, instrumentation, and closure)  ANESTHESIA: general  ESTIMATED BLOOD LOSS: 250 mL  FLUIDS REPLACED: 1900 mL of crystalloid  DRAINS: 2 medium drains to a Hemovac reservoir  IMPLANTS UTILIZED: DePuy 15 mm small stature AML femoral stem, 52 mm OD Pinnacle 100 acetabular component, +4 mm neutral Pinnacle Marathon polyethylene insert, and a 36 mm M-SPEC +1.5 mm hip ball  Discharge Diagnoses: Patient Active Problem List   Diagnosis Date Noted  . H/O total hip arthroplasty 08/31/2019  . Early satiety 07/05/2019  . Spinal stenosis 09/15/2018  . Lumbar stenosis with neurogenic claudication 09/15/2018  . Shortness of breath 06/19/2018  . Cough 04/13/2018  . Aortic atherosclerosis ([place]) 01/11/2018  . GERD (gastroesophageal reflux disease) 12/23/2016  . Atrophic vaginitis 03/18/2016  . Spondylolisthesis at L5-S1 level 12/06/2015  . Rhinitis, allergic 11/22/2015  . Right leg pain 07/27/2014  . 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 Bilateral shoulder pain 02/24/2014  . Skin lesion 01/18/2014  . History of bariatric surgery 01/18/2014  . Back pain 05/25/2013  . Major depressive disorder with single episode 05/14/2013  . Status post lumbar spine operation 04/17/2013  . Healthcare maintenance 01/07/2013  . Hypercholesterolemia 08/22/2012  . HLD (hyperlipidemia) 08/22/2012  . Hypertension 07/07/2012  . Diabetes mellitus type 2, diet-controlled ([place]) 07/07/2012  . Insomnia 07/07/2012  . Obesity (BMI 30-39.9) 07/07/2012   . Essential (primary) hypertension 07/07/2012    Past Medical History:  Diagnosis Date  . Allergy    hay fever  . Anxiety   . Arthritis   . Chronic back pain   . Diabetes mellitus   . Dyspnea    a. 07/2017 Echo: EF 60-65%. No rwma. Mildly dil LA. Nl RV fxn.  Marland Kitchen GERD (gastroesophageal reflux disease)   . History of hiatal hernia   . Hyperlipidemia   . Hypertension   . Osteoarthritis   . Pneumonia   . Spinal stenosis   . UTI (lower urinary tract infection)      Transfusion: n/a   Consultants (if any):   Discharged Condition: Improved  Hospital Course: [name] is an 73 y.o. female who was admitted 08/31/2019 with a diagnosis of left hip osteoarthritis and went to the operating room on 08/31/2019 and underwent left total hip arthroplasty through posterior approach. The patient received perioperative antibiotics for prophylaxis (see below). The patient tolerated the procedure well and was transported to PACU in stable condition. After meeting PACU criteria, the patient was subsequently transferred to the Orthopaedics/Rehabilitation unit.   The patient received DVT prophylaxis in the form of early mobilization, Lovenox, TED hose, foot pumps. A sacral pad had been placed and heels were elevated off of the bed with rolled towels in order to protect skin integrity. Foley catheter was discontinued on postoperative day #1. Wound drains were discontinued on postoperative day #2. The surgical incision was healing well without signs of infection.  Physical therapy was initiated postoperatively for transfers, gait training, and strengthening. Occupational therapy was initiated for activities of daily living and evaluation for assisted devices. Rehabilitation goals were reviewed in detail with the patient. The patient made  steady progress with physical therapy and physical therapy recommended discharge to Home.   The patient achieved his preliminary goals of this hospitalization and was felt  to be medically and orthopaedically appropriate for discharge.  She was given perioperative antibiotics:  Anti-infectives (From admission, onward)   Start     Dose/Rate Route Frequency Ordered Stop   08/31/19 1400  ceFAZolin (ANCEF) IVPB 2g/100 mL premix     2 g 200 mL/hr over 30 Minutes Intravenous Every 6 hours 08/31/19 1316 09/01/19 1713   08/31/19 0615  ceFAZolin (ANCEF) IVPB 2g/100 mL premix     2 g 200 mL/hr over 30 Minutes Intravenous On call to O.R. 08/31/19 [id number] 08/31/19 0748   08/31/19 [id number]  ceFAZolin (ANCEF) 2-4 GM/100ML-% IVPB    Note to Pharmacy: Ronnell Freshwater   : cabinet override      08/31/19 [id number] 08/31/19 0736    .  Recent vital signs:  Vitals:   09/01/19 2323 09/02/19 0828  BP: (!) 126/54 124/61  Pulse: 75 79  Resp: 18   Temp: 98.9 F (37.2 C) 99.6 F (37.6 C)  SpO2: 97% 100%    Recent laboratory studies:  No results for input(s): WBC, HGB, HCT, PLT, K, CL, CO2, BUN, CREATININE, GLUCOSE, CALCIUM, LABPT, INR in the last 72 hours.  Diagnostic Studies: Dg Hip Port Unilat With Pelvis 1v Left  Result Date: 08/31/2019 CLINICAL DATA:  Status post left total hip replacement. EXAM: DG HIP (WITH OR WITHOUT PELVIS) 1V PORT LEFT COMPARISON:  None. FINDINGS: The left femoral and acetabular components appear to be well situated. Surgical drain in other expected postsurgical changes are seen in the surrounding soft tissues. Previously placed right hip arthroplasty is also noted. IMPRESSION: Status post left total hip arthroplasty. Electronically Signed   By: Erin Camacho M.D.   On: 08/31/2019 12:51    Discharge Medications:   Allergies as of 09/02/2019      Reactions   Adhesive [tape] Itching   Honeycomb bandage       Medication List    TAKE these medications   ALPRAZolam 0.25 MG tablet Commonly known as: XANAX Take 1 tablet (0.25 mg total) by mouth daily as needed for anxiety.   celecoxib 200 MG capsule Commonly known as: CELEBREX Take 1 capsule (200 mg  total) by mouth 2 (two) times daily.   enoxaparin 40 MG/0.4ML injection Commonly known as: LOVENOX Inject 0.4 mLs (40 mg total) into the skin daily for 14 days.   fexofenadine 180 MG tablet Commonly known as: ALLEGRA Take 1 tablet (180 mg total) by mouth daily.   fluticasone 50 MCG/ACT nasal spray Commonly known as: FLONASE SPRAY 2 SPRAYS INTO EACH NOSTRIL EVERY DAY What changed: See the new instructions.   losartan-hydrochlorothiazide 50-12.5 MG tablet Commonly known as: HYZAAR TAKE 1 TABLET BY MOUTH EVERY DAY   metFORMIN 500 MG tablet Commonly known as: GLUCOPHAGE TAKE 1 TABLET BY MOUTH EVERY DAY What changed: when to take this   metoprolol succinate 25 MG 24 hr tablet Commonly known as: TOPROL-XL TAKE 1/2 TABLET BY MOUTH DAILY What changed:   how much to take  how to take this  when to take this  additional instructions   multivitamin tablet Take 1 tablet by mouth daily.   oxyCODONE 5 MG immediate release tablet Commonly known as: Oxy IR/ROXICODONE Take 1 tablet (5 mg total) by mouth every 4 (four) hours as needed for moderate pain (pain score 4-6).   pantoprazole 40 MG tablet Commonly known  as: PROTONIX TAKE 1 TABLET (40 MG TOTAL) BY MOUTH 2 (TWO) TIMES DAILY BEFORE A MEAL.   potassium chloride 10 MEQ tablet Commonly known as: KLOR-CON Take 1 tablet (10 mEq total) by mouth daily.   SALONPAS EX Apply 1 application topically daily.   sertraline 100 MG tablet Commonly known as: ZOLOFT TAKE 1/2 TABLET BY MOUTH (50 MG) BY MOUTH DAILY What changed: See the new instructions.   simvastatin 10 MG tablet Commonly known as: ZOCOR TAKE ONE-HALF TABLET (5 MG TOTAL) BY MOUTH AT BEDTIME. What changed: See the new instructions.   tiZANidine 2 MG tablet Commonly known as: ZANAFLEX Take 1 tablet (2 mg total) by mouth every 8 (eight) hours as needed for muscle spasms.   traMADol 50 MG tablet Commonly known as: ULTRAM Take 50 mg by mouth 2 (two) times daily.    traZODone 100 MG tablet Commonly known as: DESYREL TAKE 1 TABLET BY MOUTH EVERYDAY AT BEDTIME What changed: See the new instructions.   vitamin B-12 1000 MCG tablet Commonly known as: CYANOCOBALAMIN Take 1,000 mcg by mouth daily.   Vitamin D3 50 MCG (2000 UT) Tabs Take 2,000 Units by mouth daily.            Durable Medical Equipment  (From admission, onward)         Start     Ordered   08/31/19 1317  DME Walker rolling  Once    Question:  Patient needs a walker to treat with the following condition  Answer:  S/P total hip arthroplasty   08/31/19 1316   08/31/19 1317  DME Bedside commode  Once    Question:  Patient needs a bedside commode to treat with the following condition  Answer:  S/P total hip arthroplasty   08/31/19 1316          Disposition: Discharge disposition: 01-Home or Self Care         Follow-up Information    Dereck Leep, MD On 10/13/2019.   Specialty: Orthopedic Surgery Why: at 1:45pm Contact information: [place] [address] [address] [place], PA-C 09/02/2019, 11:08 AM

## 2019-09-02 NOTE — Progress Notes (Signed)
  Subjective: 2 Days Post-Op Procedure(s) (LRB): TOTAL HIP ARTHROPLASTY (Left) Patient reports pain as  Well-controlled.   Patient is well, and has had no acute complaints or problems Plan is to go Home after hospital stay. Negative for chest pain and shortness of breath Fever: no Gastrointestinal: negative for nausea and vomiting.  Patient has not had a bowel movement.  Objective: Vital signs in last 24 hours: Temp:  [98.4 F (36.9 C)-99.6 F (37.6 C)] 99.6 F (37.6 C) (10/14 0828) Pulse Rate:  [68-79] 79 (10/14 0828) Resp:  [17-18] 18 (10/13 2323) BP: (120-126)/(53-61) 124/61 (10/14 0828) SpO2:  [95 %-100 %] 100 % (10/14 0828) Weight:  [90.2 kg] 90.2 kg (10/13 1822)  Intake/Output from previous day:  Intake/Output Summary (Last 24 hours) at 09/02/2019 1059 Last data filed at 09/02/2019 0430 Gross per 24 hour  Intake -  Output 0 ml  Net 0 ml    Intake/Output this shift: No intake/output data recorded.  Labs: No results for input(s): HGB in the last 72 hours. No results for input(s): WBC, RBC, HCT, PLT in the last 72 hours. No results for input(s): NA, K, CL, CO2, BUN, CREATININE, GLUCOSE, CALCIUM in the last 72 hours. No results for input(s): LABPT, INR in the last 72 hours.   EXAM General - Patient is Alert, Appropriate and Oriented Extremity - Neurovascular intact Dorsiflexion/Plantar flexion intact Compartment soft Dressing/Incision - clean, no drainage.  Drain remains in place. Motor Function - intact, moving foot and toes well on exam.  Cardiovascular- Regular rate and rhythm, no murmurs/rubs/gallops Respiratory- Lungs clear to auscultation bilaterally Gastrointestinal- nontender, active bowel sounds x4   Assessment/Plan: 2 Days Post-Op Procedure(s) (LRB): TOTAL HIP ARTHROPLASTY (Left) Active Problems:   H/O total hip arthroplasty  Estimated body mass index is 33.09 kg/m as calculated from the following:   Height as of this encounter: 5\' 5"  (1.651  m).   Weight as of this encounter: 90.2 kg. Advance diet Up with therapy Discharge home with home health  Drain removed.  DVT Prophylaxis - Lovenox, foot pumps, TED hose Weight-Bearing as tolerated to left leg  Cassell Smiles, PA-C [facility] Orthopaedic Surgery 09/02/2019, 10:59 AM

## 2019-09-02 NOTE — TOC Initial Note (Signed)
Transition of Care [facility]) - Initial/Assessment Note    Patient Details  Name: Erin Camacho MRN: [id number] Date of Birth: December 20, 1945  [facility] of [facility]) CM/SW Contact:    Su Hilt, RN Phone Number: 09/02/2019, 10:14 AM  Clinical Narrative:                 Met with the patient to discuss DC plan and needs She lives at home with her spouse and her 74 year old [place] son The spouse will stay home with her for the rest of the week and then next week a froend coming when her husband is at work She has a Standard walker and grab bars, also has hand grips at the toilet, she stated that she does not want any additional DME She has used [place] for the other leg and wants to use them again, I contacted [place] with [facility] She can afford her medicaitons, I provided the cost of the lovenox at $90 She stated understanding  She is up to date with her PCP Her  Husband provides transportation Expected Discharge Plan: [place] Barriers to Discharge: Continued Medical Work up   Patient Goals and CMS Choice Patient states their goals for this hospitalization and ongoing recovery are:: go home CMS Medicare.gov Compare Post Acute Care list provided to:: Patient Choice offered to / list presented to : Patient  Expected Discharge Plan and Services Expected Discharge Plan: [place]   Discharge Planning Services: CM Consult Post Acute Care Choice: [place] arrangements for the past 2 months: Single Family Home                 DME Arranged: Patient refused services         HH Arranged: PT HH Agency: [place] ([facility]) Date [place]: 09/02/19 Time [place]: 1012 Representative spoke with at [place]: [place] Arrangements/Services Living arrangements for the past 2 months: [place] Lives with:: Spouse Patient language and need for interpreter reviewed:: Yes Do  you feel safe going back to the place where you live?: Yes      Need for Family Participation in Patient Care: No (Comment) Care giver support system in place?: Yes (comment)(Husband available then next week a friend is coming while husband is at work) Current home services: DME(Standard walker, Hand grips around toilet, grab bars in shower) Criminal Activity/Legal Involvement Pertinent to Current Situation/Hospitalization: No - Comment as needed  Activities of Daily Living Home Assistive Devices/Equipment: Cane (specify quad or straight) ADL Screening (condition at time of admission) Patient's cognitive ability adequate to safely complete daily activities?: Yes Is the patient deaf or have difficulty hearing?: No Does the patient have difficulty seeing, even when wearing glasses/contacts?: No Does the patient have difficulty concentrating, remembering, or making decisions?: No Patient able to express need for assistance with ADLs?: Yes Does the patient have difficulty dressing or bathing?: No Independently performs ADLs?: Yes (appropriate for developmental age) Does the patient have difficulty walking or climbing stairs?: Yes Weakness of Legs: Both Weakness of Arms/Hands: None  Permission Sought/Granted   Permission granted to share information with : Yes, Verbal Permission Granted              Emotional Assessment Appearance:: Appears stated age Attitude/Demeanor/Rapport: Engaged Affect (typically observed): Appropriate Orientation: : Oriented to Self, Oriented to Place, Oriented to  Time, Oriented to Situation Alcohol / Substance Use: Not  Applicable Psych Involvement: No (comment)  Admission diagnosis:  PRIMARY OSTEOARTHRITIS OF LEFT HIP Patient Active Problem List   Diagnosis Date Noted  . H/O total hip arthroplasty 08/31/2019  . Early satiety 07/05/2019  . Spinal stenosis 09/15/2018  . Lumbar stenosis with neurogenic claudication 09/15/2018  . Shortness of breath  06/19/2018  . Cough 04/13/2018  . Aortic atherosclerosis ([place]) 01/11/2018  . GERD (gastroesophageal reflux disease) 12/23/2016  . Atrophic vaginitis 03/18/2016  . Spondylolisthesis at L5-S1 level 12/06/2015  . Rhinitis, allergic 11/22/2015  . Right leg pain 07/27/2014  . 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 Bilateral shoulder pain 02/24/2014  . Skin lesion 01/18/2014  . History of bariatric surgery 01/18/2014  . Back pain 05/25/2013  . Major depressive disorder with single episode 05/14/2013  . Status post lumbar spine operation 04/17/2013  . Healthcare maintenance 01/07/2013  . Hypercholesterolemia 08/22/2012  . HLD (hyperlipidemia) 08/22/2012  . Hypertension 07/07/2012  . Diabetes mellitus type 2, diet-controlled ([place]) 07/07/2012  . Insomnia 07/07/2012  . Obesity (BMI 30-39.9) 07/07/2012  . Essential (primary) hypertension 07/07/2012   PCP:  [name], MD Pharmacy:   [id number] [id number]: [phone]: [phone]    Social Determinants of Health (SDOH) Interventions    Readmission Risk Interventions No flowsheet data found.

## 2019-09-04 DIAGNOSIS — Z9181 History of falling: Secondary | ICD-10-CM | POA: Diagnosis not present

## 2019-09-04 DIAGNOSIS — M4317 Spondylolisthesis, lumbosacral region: Secondary | ICD-10-CM | POA: Diagnosis not present

## 2019-09-04 DIAGNOSIS — F419 Anxiety disorder, unspecified: Secondary | ICD-10-CM | POA: Diagnosis not present

## 2019-09-04 DIAGNOSIS — Z6830 Body mass index (BMI) 30.0-30.9, adult: Secondary | ICD-10-CM | POA: Diagnosis not present

## 2019-09-04 DIAGNOSIS — G8929 Other chronic pain: Secondary | ICD-10-CM | POA: Diagnosis not present

## 2019-09-04 DIAGNOSIS — M48061 Spinal stenosis, lumbar region without neurogenic claudication: Secondary | ICD-10-CM | POA: Diagnosis not present

## 2019-09-04 DIAGNOSIS — I700 Atherosclerosis of aorta: Secondary | ICD-10-CM | POA: Diagnosis not present

## 2019-09-04 DIAGNOSIS — Z87440 Personal history of urinary (tract) infections: Secondary | ICD-10-CM | POA: Diagnosis not present

## 2019-09-04 DIAGNOSIS — F329 Major depressive disorder, single episode, unspecified: Secondary | ICD-10-CM | POA: Diagnosis not present

## 2019-09-04 DIAGNOSIS — G4700 Insomnia, unspecified: Secondary | ICD-10-CM | POA: Diagnosis not present

## 2019-09-04 DIAGNOSIS — E785 Hyperlipidemia, unspecified: Secondary | ICD-10-CM | POA: Diagnosis not present

## 2019-09-04 DIAGNOSIS — Z471 Aftercare following joint replacement surgery: Secondary | ICD-10-CM | POA: Diagnosis not present

## 2019-09-04 DIAGNOSIS — I10 Essential (primary) hypertension: Secondary | ICD-10-CM | POA: Diagnosis not present

## 2019-09-04 DIAGNOSIS — Z8701 Personal history of pneumonia (recurrent): Secondary | ICD-10-CM | POA: Diagnosis not present

## 2019-09-04 DIAGNOSIS — E119 Type 2 diabetes mellitus without complications: Secondary | ICD-10-CM | POA: Diagnosis not present

## 2019-09-04 DIAGNOSIS — E669 Obesity, unspecified: Secondary | ICD-10-CM | POA: Diagnosis not present

## 2019-09-04 DIAGNOSIS — Z96642 Presence of left artificial hip joint: Secondary | ICD-10-CM | POA: Diagnosis not present

## 2019-09-04 DIAGNOSIS — K219 Gastro-esophageal reflux disease without esophagitis: Secondary | ICD-10-CM | POA: Diagnosis not present

## 2019-09-09 ENCOUNTER — Other Ambulatory Visit: Payer: Self-pay | Admitting: Internal Medicine

## 2019-09-21 DIAGNOSIS — Z8701 Personal history of pneumonia (recurrent): Secondary | ICD-10-CM | POA: Diagnosis not present

## 2019-09-21 DIAGNOSIS — G8929 Other chronic pain: Secondary | ICD-10-CM | POA: Diagnosis not present

## 2019-09-21 DIAGNOSIS — Z87440 Personal history of urinary (tract) infections: Secondary | ICD-10-CM | POA: Diagnosis not present

## 2019-09-21 DIAGNOSIS — Z6830 Body mass index (BMI) 30.0-30.9, adult: Secondary | ICD-10-CM | POA: Diagnosis not present

## 2019-09-21 DIAGNOSIS — E785 Hyperlipidemia, unspecified: Secondary | ICD-10-CM | POA: Diagnosis not present

## 2019-09-21 DIAGNOSIS — I10 Essential (primary) hypertension: Secondary | ICD-10-CM | POA: Diagnosis not present

## 2019-09-21 DIAGNOSIS — G4700 Insomnia, unspecified: Secondary | ICD-10-CM | POA: Diagnosis not present

## 2019-09-21 DIAGNOSIS — K219 Gastro-esophageal reflux disease without esophagitis: Secondary | ICD-10-CM | POA: Diagnosis not present

## 2019-09-21 DIAGNOSIS — M4317 Spondylolisthesis, lumbosacral region: Secondary | ICD-10-CM | POA: Diagnosis not present

## 2019-09-21 DIAGNOSIS — Z471 Aftercare following joint replacement surgery: Secondary | ICD-10-CM | POA: Diagnosis not present

## 2019-09-21 DIAGNOSIS — I700 Atherosclerosis of aorta: Secondary | ICD-10-CM | POA: Diagnosis not present

## 2019-09-21 DIAGNOSIS — Z96642 Presence of left artificial hip joint: Secondary | ICD-10-CM | POA: Diagnosis not present

## 2019-09-21 DIAGNOSIS — E119 Type 2 diabetes mellitus without complications: Secondary | ICD-10-CM | POA: Diagnosis not present

## 2019-09-21 DIAGNOSIS — F419 Anxiety disorder, unspecified: Secondary | ICD-10-CM | POA: Diagnosis not present

## 2019-09-21 DIAGNOSIS — Z9181 History of falling: Secondary | ICD-10-CM | POA: Diagnosis not present

## 2019-09-21 DIAGNOSIS — M48061 Spinal stenosis, lumbar region without neurogenic claudication: Secondary | ICD-10-CM | POA: Diagnosis not present

## 2019-09-21 DIAGNOSIS — E669 Obesity, unspecified: Secondary | ICD-10-CM | POA: Diagnosis not present

## 2019-09-21 DIAGNOSIS — F329 Major depressive disorder, single episode, unspecified: Secondary | ICD-10-CM | POA: Diagnosis not present

## 2019-09-22 DIAGNOSIS — J301 Allergic rhinitis due to pollen: Secondary | ICD-10-CM | POA: Diagnosis not present

## 2019-09-25 DIAGNOSIS — R1310 Dysphagia, unspecified: Secondary | ICD-10-CM | POA: Diagnosis not present

## 2019-09-29 DIAGNOSIS — J301 Allergic rhinitis due to pollen: Secondary | ICD-10-CM | POA: Diagnosis not present

## 2019-10-06 DIAGNOSIS — J301 Allergic rhinitis due to pollen: Secondary | ICD-10-CM | POA: Diagnosis not present

## 2019-10-07 ENCOUNTER — Other Ambulatory Visit: Payer: Self-pay | Admitting: Internal Medicine

## 2019-10-13 DIAGNOSIS — J301 Allergic rhinitis due to pollen: Secondary | ICD-10-CM | POA: Diagnosis not present

## 2019-10-13 DIAGNOSIS — M1612 Unilateral primary osteoarthritis, left hip: Secondary | ICD-10-CM | POA: Diagnosis not present

## 2019-10-20 DIAGNOSIS — J301 Allergic rhinitis due to pollen: Secondary | ICD-10-CM | POA: Diagnosis not present

## 2019-10-27 DIAGNOSIS — J301 Allergic rhinitis due to pollen: Secondary | ICD-10-CM | POA: Diagnosis not present

## 2019-10-28 DIAGNOSIS — M48062 Spinal stenosis, lumbar region with neurogenic claudication: Secondary | ICD-10-CM | POA: Diagnosis not present

## 2019-10-28 DIAGNOSIS — M48061 Spinal stenosis, lumbar region without neurogenic claudication: Secondary | ICD-10-CM | POA: Diagnosis not present

## 2019-10-29 DIAGNOSIS — Z471 Aftercare following joint replacement surgery: Secondary | ICD-10-CM | POA: Diagnosis not present

## 2019-11-02 DIAGNOSIS — J301 Allergic rhinitis due to pollen: Secondary | ICD-10-CM | POA: Diagnosis not present

## 2019-11-03 DIAGNOSIS — M5415 Radiculopathy, thoracolumbar region: Secondary | ICD-10-CM | POA: Diagnosis not present

## 2019-11-03 DIAGNOSIS — M5116 Intervertebral disc disorders with radiculopathy, lumbar region: Secondary | ICD-10-CM | POA: Diagnosis not present

## 2019-11-05 ENCOUNTER — Other Ambulatory Visit: Payer: Self-pay

## 2019-11-05 ENCOUNTER — Ambulatory Visit
Admission: RE | Admit: 2019-11-05 | Discharge: 2019-11-05 | Disposition: A | Discharge status: Home / Self Care | Payer: PPO | Source: Ambulatory Visit | Attending: Internal Medicine | Admitting: Internal Medicine

## 2019-11-05 DIAGNOSIS — Z1231 Encounter for screening mammogram for malignant neoplasm of breast: Secondary | ICD-10-CM | POA: Diagnosis present

## 2019-11-10 DIAGNOSIS — J301 Allergic rhinitis due to pollen: Secondary | ICD-10-CM | POA: Diagnosis not present

## 2019-11-17 DIAGNOSIS — H2513 Age-related nuclear cataract, bilateral: Secondary | ICD-10-CM | POA: Diagnosis not present

## 2019-11-17 DIAGNOSIS — J301 Allergic rhinitis due to pollen: Secondary | ICD-10-CM | POA: Diagnosis not present

## 2019-11-17 LAB — HM DIABETES EYE EXAM

## 2019-11-20 HISTORY — PX: EYE SURGERY: SHX253

## 2019-11-24 DIAGNOSIS — J301 Allergic rhinitis due to pollen: Secondary | ICD-10-CM | POA: Diagnosis not present

## 2019-12-01 DIAGNOSIS — J301 Allergic rhinitis due to pollen: Secondary | ICD-10-CM | POA: Diagnosis not present

## 2019-12-03 ENCOUNTER — Ambulatory Visit: Payer: PPO

## 2019-12-03 ENCOUNTER — Ambulatory Visit: Payer: PPO | Admitting: Internal Medicine

## 2019-12-04 DIAGNOSIS — I10 Essential (primary) hypertension: Secondary | ICD-10-CM | POA: Diagnosis not present

## 2019-12-04 DIAGNOSIS — H2512 Age-related nuclear cataract, left eye: Secondary | ICD-10-CM | POA: Diagnosis not present

## 2019-12-08 ENCOUNTER — Encounter: Payer: Self-pay | Admitting: Ophthalmology

## 2019-12-08 ENCOUNTER — Other Ambulatory Visit: Payer: Self-pay

## 2019-12-08 DIAGNOSIS — J301 Allergic rhinitis due to pollen: Secondary | ICD-10-CM | POA: Diagnosis not present

## 2019-12-14 ENCOUNTER — Other Ambulatory Visit: Payer: Self-pay

## 2019-12-14 ENCOUNTER — Other Ambulatory Visit
Admission: RE | Admit: 2019-12-14 | Discharge: 2019-12-14 | Disposition: A | Discharge status: Home / Self Care | Payer: Medicare HMO | Source: Ambulatory Visit | Attending: Ophthalmology | Admitting: Ophthalmology

## 2019-12-14 DIAGNOSIS — Z01812 Encounter for preprocedural laboratory examination: Secondary | ICD-10-CM | POA: Diagnosis present

## 2019-12-14 DIAGNOSIS — R69 Illness, unspecified: Secondary | ICD-10-CM | POA: Diagnosis not present

## 2019-12-14 DIAGNOSIS — Z20822 Contact with and (suspected) exposure to covid-19: Secondary | ICD-10-CM | POA: Diagnosis not present

## 2019-12-14 LAB — SARS CORONAVIRUS 2 (TAT 6-24 HRS): SARS Coronavirus 2: NEGATIVE

## 2019-12-14 NOTE — Discharge Instructions (Signed)

## 2019-12-16 ENCOUNTER — Ambulatory Visit
Admission: RE | Admit: 2019-12-16 | Discharge: 2019-12-16 | Disposition: A | Discharge status: Home / Self Care | Payer: Medicare HMO | Attending: Ophthalmology | Admitting: Ophthalmology

## 2019-12-16 ENCOUNTER — Encounter
Admission: RE | Disposition: A | Discharge status: Home / Self Care | Payer: Self-pay | Source: Home / Self Care | Attending: Ophthalmology

## 2019-12-16 ENCOUNTER — Other Ambulatory Visit: Payer: Self-pay

## 2019-12-16 ENCOUNTER — Ambulatory Visit: Payer: Medicare HMO | Admitting: Anesthesiology

## 2019-12-16 ENCOUNTER — Encounter: Payer: Self-pay | Admitting: Ophthalmology

## 2019-12-16 DIAGNOSIS — J302 Other seasonal allergic rhinitis: Secondary | ICD-10-CM | POA: Diagnosis not present

## 2019-12-16 DIAGNOSIS — F419 Anxiety disorder, unspecified: Secondary | ICD-10-CM | POA: Insufficient documentation

## 2019-12-16 DIAGNOSIS — F329 Major depressive disorder, single episode, unspecified: Secondary | ICD-10-CM | POA: Diagnosis not present

## 2019-12-16 DIAGNOSIS — E1136 Type 2 diabetes mellitus with diabetic cataract: Secondary | ICD-10-CM | POA: Insufficient documentation

## 2019-12-16 DIAGNOSIS — E7800 Pure hypercholesterolemia, unspecified: Secondary | ICD-10-CM | POA: Diagnosis not present

## 2019-12-16 DIAGNOSIS — I10 Essential (primary) hypertension: Secondary | ICD-10-CM | POA: Diagnosis not present

## 2019-12-16 DIAGNOSIS — H2512 Age-related nuclear cataract, left eye: Secondary | ICD-10-CM | POA: Diagnosis present

## 2019-12-16 DIAGNOSIS — Z7984 Long term (current) use of oral hypoglycemic drugs: Secondary | ICD-10-CM | POA: Insufficient documentation

## 2019-12-16 DIAGNOSIS — H25812 Combined forms of age-related cataract, left eye: Secondary | ICD-10-CM | POA: Diagnosis not present

## 2019-12-16 DIAGNOSIS — K219 Gastro-esophageal reflux disease without esophagitis: Secondary | ICD-10-CM | POA: Insufficient documentation

## 2019-12-16 DIAGNOSIS — R69 Illness, unspecified: Secondary | ICD-10-CM | POA: Diagnosis not present

## 2019-12-16 HISTORY — PX: CATARACT EXTRACTION W/PHACO: SHX586

## 2019-12-16 LAB — GLUCOSE, CAPILLARY
Glucose-Capillary: 108 mg/dL — ABNORMAL HIGH (ref 70–99)
Glucose-Capillary: 103 mg/dL — ABNORMAL HIGH (ref 70–99)

## 2019-12-16 SURGERY — CATARACT EXTRACTION PHACO AND INTRAOCULAR LENS PLACEMENT (IOC)
Anesthesia: Monitor Anesthesia Care | Site: Eye | Laterality: Left

## 2019-12-16 MED ORDER — EPINEPHRINE PF 1 MG/ML IJ SOLN
INTRAOCULAR | Status: DC | PRN
  Administered 2019-12-16: 83 mL via OPHTHALMIC

## 2019-12-16 MED ORDER — CEFUROXIME OPHTHALMIC INJECTION 1 MG/0.1 ML
INJECTION | OPHTHALMIC | Status: DC | PRN
  Administered 2019-12-16: 0.1 mL via INTRACAMERAL

## 2019-12-16 MED ORDER — ARMC OPHTHALMIC DILATING DROPS
1.0000 "application " | OPHTHALMIC | Status: DC | PRN
  Administered 2019-12-16 (×3): 1 "application " via OPHTHALMIC

## 2019-12-16 MED ORDER — NA HYALUR & NA CHOND-NA HYALUR 0.4-0.35 ML IO KIT
PACK | INTRAOCULAR | Status: DC | PRN
  Administered 2019-12-16: 1 mL via INTRAOCULAR

## 2019-12-16 MED ORDER — MIDAZOLAM HCL 2 MG/2ML IJ SOLN
INTRAMUSCULAR | Status: DC | PRN
  Administered 2019-12-16: 1 mg via INTRAVENOUS

## 2019-12-16 MED ORDER — BRIMONIDINE TARTRATE-TIMOLOL 0.2-0.5 % OP SOLN
OPHTHALMIC | Status: DC | PRN
  Administered 2019-12-16: 1 [drp] via OPHTHALMIC

## 2019-12-16 MED ORDER — ACETAMINOPHEN 325 MG PO TABS: 325.0000 mg | ORAL_TABLET | ORAL | Status: DC | PRN

## 2019-12-16 MED ORDER — FENTANYL CITRATE (PF) 100 MCG/2ML IJ SOLN
INTRAMUSCULAR | Status: DC | PRN
  Administered 2019-12-16: 50 ug via INTRAVENOUS

## 2019-12-16 MED ORDER — ONDANSETRON HCL 4 MG/2ML IJ SOLN: 4.0000 mg | Freq: Once | INTRAMUSCULAR | Status: DC | PRN

## 2019-12-16 MED ORDER — LIDOCAINE HCL (PF) 2 % IJ SOLN
INTRAOCULAR | Status: DC | PRN
  Administered 2019-12-16: 2 mL

## 2019-12-16 MED ORDER — MOXIFLOXACIN HCL 0.5 % OP SOLN
1.0000 [drp] | OPHTHALMIC | Status: DC | PRN
  Administered 2019-12-16 (×3): 1 [drp] via OPHTHALMIC

## 2019-12-16 MED ORDER — LACTATED RINGERS IV SOLN: 100.0000 mL/h | INTRAVENOUS | Status: DC

## 2019-12-16 MED ORDER — TETRACAINE HCL 0.5 % OP SOLN
1.0000 [drp] | OPHTHALMIC | Status: DC | PRN
  Administered 2019-12-16 (×3): 1 [drp] via OPHTHALMIC

## 2019-12-16 MED ORDER — ACETAMINOPHEN 160 MG/5ML PO SOLN: 325.0000 mg | ORAL | Status: DC | PRN

## 2019-12-16 SURGICAL SUPPLY — 20 items
CANNULA ANT/CHMB 27GA (MISCELLANEOUS) ×2 IMPLANT
GLOVE SURG LX 7.5 STRW (GLOVE) ×1
GLOVE SURG LX STRL 7.5 STRW (GLOVE) ×1
GLOVE SURG TRIUMPH 8.0 PF LTX (GLOVE) ×2
GOWN STRL REUS W/ TWL LRG LVL3 (GOWN DISPOSABLE) ×2
GOWN STRL REUS W/TWL LRG LVL3 (GOWN DISPOSABLE) ×2
LENS IOL IQ PANOPTIX 21.5 ×2 IMPLANT
MARKER SKIN DUAL TIP RULER LAB (MISCELLANEOUS) ×2
NDL CAPSULORHEX 25GA (NEEDLE) IMPLANT
NDL FILTER BLUNT 18X1 1/2 (NEEDLE) IMPLANT
NEEDLE CAPSULORHEX 25GA (NEEDLE) ×2
NEEDLE FILTER BLUNT 18X 1/2SAF (NEEDLE) ×2
NEEDLE FILTER BLUNT 18X1 1/2 (NEEDLE) ×2
PACK CATARACT BRASINGTON (MISCELLANEOUS) ×2
PACK EYE AFTER SURG (MISCELLANEOUS) ×2
PACK OPTHALMIC (MISCELLANEOUS) ×2
SYR 3ML LL SCALE MARK (SYRINGE) ×4
SYR TB 1ML LUER SLIP (SYRINGE) ×2
WATER STERILE IRR 250ML POUR (IV SOLUTION) ×2
WIPE NON LINTING 3.25X3.25 (MISCELLANEOUS) ×2

## 2019-12-16 NOTE — Anesthesia Postprocedure Evaluation (Addendum)
Anesthesia Post Note  Patient: Erin Camacho  Procedure(s) Performed: CATARACT EXTRACTION PHACO AND INTRAOCULAR LENS PLACEMENT (IOC) LEFT PANOPTIX LENS DIABETIC 8.07 01:19.2 10.2% (Left Eye)     Patient location during evaluation: Phase II Anesthesia Type: MAC Level of consciousness: awake and alert and oriented Pain management: pain level controlled Vital Signs Assessment: post-procedure vital signs reviewed and stable Respiratory status: nonlabored ventilation, spontaneous breathing and respiratory function stable Cardiovascular status: blood pressure returned to baseline and stable Postop Assessment: no headache and no backache Anesthetic complications: no    Sinda Du

## 2019-12-16 NOTE — H&P (Signed)

## 2019-12-16 NOTE — Anesthesia Preprocedure Evaluation (Signed)
Anesthesia Evaluation  Patient identified by MRN, date of birth, ID band Patient awake    Reviewed: Allergy & Precautions, NPO status , Patient's Chart, lab work & pertinent test results, reviewed documented beta blocker date and time   Airway Mallampati: II  TM Distance: >3 FB Neck ROM: Full    Dental no notable dental hx.    Pulmonary shortness of breath, pneumonia,    Pulmonary exam normal breath sounds clear to auscultation       Cardiovascular Exercise Tolerance: Good hypertension, Normal cardiovascular exam Rhythm:Regular Rate:Normal     Neuro/Psych PSYCHIATRIC DISORDERS Anxiety Depression  Neuromuscular disease    GI/Hepatic hiatal hernia, GERD  ,  Endo/Other  diabetes  Renal/GU negative Renal ROS  negative genitourinary   Musculoskeletal  (+) Arthritis ,   Abdominal Normal abdominal exam  (+) - obese,   Peds negative pediatric ROS (+)  Hematology negative hematology ROS (+)   Anesthesia Other Findings   Reproductive/Obstetrics negative OB ROS                             Anesthesia Physical Anesthesia Plan  ASA: III  Anesthesia Plan: MAC   Post-op Pain Management:    Induction: Intravenous  PONV Risk Score and Plan: 2 and TIVA, Treatment may vary due to age or medical condition and Midazolam  Airway Management Planned: Nasal Cannula  Additional Equipment:   Intra-op Plan:   Post-operative Plan:   Informed Consent: I have reviewed the patients History and Physical, chart, labs and discussed the procedure including the risks, benefits and alternatives for the proposed anesthesia with the patient or authorized representative who has indicated his/her understanding and acceptance.     Dental advisory given  Plan Discussed with: CRNA and Anesthesiologist  Anesthesia Plan Comments:         Anesthesia Quick Evaluation  Patient Active Problem List   Diagnosis  Date Noted  . H/O total hip arthroplasty 08/31/2019  . Early satiety 07/05/2019  . Spinal stenosis 09/15/2018  . Lumbar stenosis with neurogenic claudication 09/15/2018  . Shortness of breath 06/19/2018  . Cough 04/13/2018  . Aortic atherosclerosis ([place]) 01/11/2018  . GERD (gastroesophageal reflux disease) 12/23/2016  . Atrophic vaginitis 03/18/2016  . Spondylolisthesis at L5-S1 level 12/06/2015  . Rhinitis, allergic 11/22/2015  . Right leg pain 07/27/2014  . 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 Bilateral shoulder pain 02/24/2014  . Skin lesion 01/18/2014  . History of bariatric surgery 01/18/2014  . Back pain 05/25/2013  . Major depressive disorder with single episode 05/14/2013  . Status post lumbar spine operation 04/17/2013  . Healthcare maintenance 01/07/2013  . Hypercholesterolemia 08/22/2012  . HLD (hyperlipidemia) 08/22/2012  . Hypertension 07/07/2012  . Diabetes mellitus type 2, diet-controlled ([place]) 07/07/2012  . Insomnia 07/07/2012  . Obesity (BMI 30-39.9) 07/07/2012  . Essential (primary) hypertension 07/07/2012    CBC Latest Ref Rng & Units 08/21/2019 02/13/2019 10/20/2018  WBC 4.0 - 10.5 K/uL 4.6 8.6 5.9  Hemoglobin 12.0 - 15.0 g/dL 12.0 12.6 11.0(L)  Hematocrit 36.0 - 46.0 % 37.0 37.7 32.8(L)  Platelets 150 - 400 K/uL 190 251 195.0   BMP Latest Ref Rng & Units 08/21/2019 07/14/2019 03/18/2019  Glucose 70 - 99 mg/dL 116(H) 122(H) -  BUN 8 - 23 mg/dL 20 18 -  Creatinine 0.44 - 1.00 mg/dL 0.67 0.88 -  BUN/Creat Ratio 6 - 22 (calc) - - -  Sodium 135 - 145 mmol/L 140 141 -  Potassium 3.5 - 5.1 mmol/L 3.3(L) 3.7 -  Chloride 98 - 111 mmol/L 104 104 -  CO2 22 - 32 mmol/L 28 28 -  Calcium 8.9 - 10.3 mg/dL 9.7 10.0 9.5    Risks and benefits of anesthesia discussed at length, patient or surrogate demonstrates understanding. Appropriately NPO. Plan to proceed with anesthesia.  Champ Mungo, MD 12/16/19

## 2019-12-16 NOTE — Transfer of Care (Signed)
Immediate Anesthesia Transfer of Care Note  Patient: Erin Camacho  Procedure(s) Performed: CATARACT EXTRACTION PHACO AND INTRAOCULAR LENS PLACEMENT (IOC) LEFT PANOPTIX LENS DIABETIC 8.07 01:19.2 10.2% (Left Eye)  Patient Location: PACU  Anesthesia Type: MAC  Level of Consciousness: awake, alert  and patient cooperative  Airway and Oxygen Therapy: Patient Spontanous Breathing and Patient connected to supplemental oxygen  Post-op Assessment: Post-op Vital signs reviewed, Patient's Cardiovascular Status Stable, Respiratory Function Stable, Patent Airway and No signs of Nausea or vomiting  Post-op Vital Signs: Reviewed and stable  Complications: No apparent anesthesia complications

## 2019-12-16 NOTE — Op Note (Signed)
OPERATIVE NOTE  LASHASTA CRUICKSHANK [id number] 12/16/2019   PREOPERATIVE DIAGNOSIS:  Nuclear sclerotic cataract left eye. H25.12   POSTOPERATIVE DIAGNOSIS:    Nuclear sclerotic cataract left eye.     PROCEDURE:  Phacoemusification with posterior chamber intraocular lens placement of the left eye  Ultrasound time: Procedure(s) with comments: CATARACT EXTRACTION PHACO AND INTRAOCULAR LENS PLACEMENT (IOC) LEFT PANOPTIX LENS DIABETIC 8.07 01:19.2 10.2% (Left) - Diabetic - oral meds  LENS:   Implant Name Type Inv. Item Serial No. Manufacturer Lot No. LRB No. Used Action  AcrySof IQ PanOptix IOL Intraocular Lens  [id number] ALCON  Left 1 Implanted    [id number] 21.5 D Panoptix iol  SURGEON:  Wyonia Hough, MD   ANESTHESIA:  Topical with tetracaine drops and 2% Xylocaine jelly, augmented with 1% preservative-free intracameral lidocaine.    COMPLICATIONS:  None.   DESCRIPTION OF PROCEDURE:  The patient was identified in the holding room and transported to the operating room and placed in the supine position under the operating microscope.  The left eye was identified as the operative eye and it was prepped and draped in the usual sterile ophthalmic fashion.   A 1 millimeter clear-corneal paracentesis was made at the 1:30 position.  0.5 ml of preservative-free 1% lidocaine was injected into the anterior chamber.  The anterior chamber was filled with Viscoat viscoelastic.  A 2.4 millimeter keratome was used to make a near-clear corneal incision at the 10:30 position.  .  A curvilinear capsulorrhexis was made with a cystotome and capsulorrhexis forceps.  Balanced salt solution was used to hydrodissect and hydrodelineate the nucleus.   Phacoemulsification was then used in stop and chop fashion to remove the lens nucleus and epinucleus.  The remaining cortex was then removed using the irrigation and aspiration handpiece. Provisc was then placed into the capsular bag to distend it for lens placement.   A lens was then injected into the capsular bag.  The remaining viscoelastic was aspirated.   Wounds were hydrated with balanced salt solution.  The anterior chamber was inflated to a physiologic pressure with balanced salt solution.  No wound leaks were noted. Cefuroxime 0.1 ml of a 10mg /ml solution was injected into the anterior chamber for a dose of 1 mg of intracameral antibiotic at the completion of the case.   Timolol and Brimonidine drops were applied to the eye.  The patient was taken to the recovery room in stable condition without complications of anesthesia or surgery.  Pamlea Finder 12/16/2019, 11:34 AM

## 2019-12-17 ENCOUNTER — Encounter: Payer: Self-pay | Admitting: *Deleted

## 2019-12-22 DIAGNOSIS — J301 Allergic rhinitis due to pollen: Secondary | ICD-10-CM | POA: Diagnosis not present

## 2019-12-25 DIAGNOSIS — E119 Type 2 diabetes mellitus without complications: Secondary | ICD-10-CM | POA: Diagnosis not present

## 2019-12-25 DIAGNOSIS — H2511 Age-related nuclear cataract, right eye: Secondary | ICD-10-CM | POA: Diagnosis not present

## 2019-12-29 ENCOUNTER — Other Ambulatory Visit: Payer: Self-pay

## 2019-12-29 ENCOUNTER — Other Ambulatory Visit: Payer: PPO

## 2019-12-29 ENCOUNTER — Other Ambulatory Visit (INDEPENDENT_AMBULATORY_CARE_PROVIDER_SITE_OTHER): Payer: Medicare HMO

## 2019-12-29 DIAGNOSIS — E119 Type 2 diabetes mellitus without complications: Secondary | ICD-10-CM

## 2019-12-29 DIAGNOSIS — I10 Essential (primary) hypertension: Secondary | ICD-10-CM

## 2019-12-29 DIAGNOSIS — J301 Allergic rhinitis due to pollen: Secondary | ICD-10-CM | POA: Diagnosis not present

## 2019-12-29 DIAGNOSIS — E7800 Pure hypercholesterolemia, unspecified: Secondary | ICD-10-CM | POA: Diagnosis not present

## 2019-12-29 LAB — HEPATIC FUNCTION PANEL
ALT: 12 U/L (ref 0–35)
AST: 15 U/L (ref 0–37)
Albumin: 4.1 g/dL (ref 3.5–5.2)
Alkaline Phosphatase: 66 U/L (ref 39–117)
Bilirubin, Direct: 0.1 mg/dL (ref 0.0–0.3)
Total Bilirubin: 0.5 mg/dL (ref 0.2–1.2)
Total Protein: 6.7 g/dL (ref 6.0–8.3)

## 2019-12-29 LAB — BASIC METABOLIC PANEL WITH GFR
BUN: 25 mg/dL — ABNORMAL HIGH (ref 6–23)
CO2: 29 meq/L (ref 19–32)
Calcium: 10 mg/dL (ref 8.4–10.5)
Chloride: 107 meq/L (ref 96–112)
Creatinine, Ser: 0.83 mg/dL (ref 0.40–1.20)
GFR: 67.08 mL/min
Glucose, Bld: 112 mg/dL — ABNORMAL HIGH (ref 70–99)
Potassium: 4.1 meq/L (ref 3.5–5.1)
Sodium: 141 meq/L (ref 135–145)

## 2019-12-29 LAB — LIPID PANEL
Cholesterol: 178 mg/dL (ref 0–200)
HDL: 68.7 mg/dL
LDL Cholesterol: 93 mg/dL (ref 0–99)
NonHDL: 109.22
Total CHOL/HDL Ratio: 3
Triglycerides: 80 mg/dL (ref 0.0–149.0)
VLDL: 16 mg/dL (ref 0.0–40.0)

## 2019-12-29 LAB — TSH: TSH: 2.16 u[IU]/mL (ref 0.35–4.50)

## 2019-12-29 LAB — HEMOGLOBIN A1C: Hgb A1c MFr Bld: 6.4 % (ref 4.6–6.5)

## 2019-12-30 ENCOUNTER — Encounter: Payer: Self-pay | Admitting: Ophthalmology

## 2019-12-31 ENCOUNTER — Ambulatory Visit (INDEPENDENT_AMBULATORY_CARE_PROVIDER_SITE_OTHER): Payer: Medicare HMO | Admitting: Internal Medicine

## 2019-12-31 ENCOUNTER — Other Ambulatory Visit: Payer: Self-pay

## 2019-12-31 ENCOUNTER — Encounter: Payer: Self-pay | Admitting: Internal Medicine

## 2019-12-31 DIAGNOSIS — I10 Essential (primary) hypertension: Secondary | ICD-10-CM

## 2019-12-31 DIAGNOSIS — M545 Low back pain, unspecified: Secondary | ICD-10-CM

## 2019-12-31 DIAGNOSIS — E7800 Pure hypercholesterolemia, unspecified: Secondary | ICD-10-CM

## 2019-12-31 DIAGNOSIS — R69 Illness, unspecified: Secondary | ICD-10-CM | POA: Diagnosis not present

## 2019-12-31 DIAGNOSIS — F320 Major depressive disorder, single episode, mild: Secondary | ICD-10-CM | POA: Diagnosis not present

## 2019-12-31 DIAGNOSIS — E119 Type 2 diabetes mellitus without complications: Secondary | ICD-10-CM | POA: Diagnosis not present

## 2019-12-31 DIAGNOSIS — I700 Atherosclerosis of aorta: Secondary | ICD-10-CM

## 2019-12-31 DIAGNOSIS — K219 Gastro-esophageal reflux disease without esophagitis: Secondary | ICD-10-CM

## 2019-12-31 DIAGNOSIS — R634 Abnormal weight loss: Secondary | ICD-10-CM | POA: Insufficient documentation

## 2019-12-31 DIAGNOSIS — F32A Depression, unspecified: Secondary | ICD-10-CM

## 2019-12-31 LAB — CBC WITH DIFFERENTIAL/PLATELET
Basophils Absolute: 0 10*3/uL (ref 0.0–0.1)
Basophils Relative: 0.8 % (ref 0.0–3.0)
Eosinophils Absolute: 0.1 10*3/uL (ref 0.0–0.7)
Eosinophils Relative: 1.7 % (ref 0.0–5.0)
HCT: 34.5 % — ABNORMAL LOW (ref 36.0–46.0)
Hemoglobin: 11.4 g/dL — ABNORMAL LOW (ref 12.0–15.0)
Lymphocytes Relative: 32 % (ref 12.0–46.0)
Lymphs Abs: 1.5 10*3/uL (ref 0.7–4.0)
MCHC: 33.1 g/dL (ref 30.0–36.0)
MCV: 87.6 fl (ref 78.0–100.0)
Monocytes Absolute: 0.5 10*3/uL (ref 0.1–1.0)
Monocytes Relative: 10.7 % (ref 3.0–12.0)
Neutro Abs: 2.5 10*3/uL (ref 1.4–7.7)
Neutrophils Relative %: 54.8 % (ref 43.0–77.0)
Platelets: 232 10*3/uL (ref 150.0–400.0)
RBC: 3.94 Mil/uL (ref 3.87–5.11)
RDW: 17 % — ABNORMAL HIGH (ref 11.5–15.5)
WBC: 4.6 10*3/uL (ref 4.0–10.5)

## 2019-12-31 LAB — VITAMIN B12: Vitamin B-12: >1500 pg/mL — ABNORMAL HIGH (ref 211–911)

## 2019-12-31 NOTE — Progress Notes (Signed)
Patient ID: Erin Camacho, female   DOB: June 22, 1946, 74 y.o.   MRN: [id number]   Subjective:    Patient ID: Erin Camacho, female    DOB: [birth year], 74 y.o.   MRN: [id number]  HPI This visit occurred during the SARS-CoV-2 public health emergency.  Safety protocols were in place, including screening questions prior to the visit, additional usage of staff PPE, and extensive cleaning of exam room while observing appropriate contact time as indicated for disinfecting solutions.  Patient here for a scheduled follow up.  S/p hip surgery 08/31/19 (left).  Followed by ortho.  Hip is doing well.  Having persistent back pain.  Has seen Dr Ellene Route.  Per pt, not a candidate for further surgery.  Request referral to Dr Sharlet Salina for pain management.  Is currently taking tramadol.  Request refill until appt with Dr Sharlet Salina.  Has had no problems taking with zoloft.  No history of seizures.  Also with increased stress.  Dealing with chronic pain.  increased family stress.  Discussed referral to psychiatry.  No chest pain or sob reported.  No acid reflux.  Has had significant weight loss.  Also reports some oozing (rectal).  cologuard negative 07/2017.     Past Medical History:  Diagnosis Date  . Allergy    hay fever  . Anxiety   . Arthritis   . Chronic back pain   . Diabetes mellitus   . Dyspnea    a. 07/2017 Echo: EF 60-65%. No rwma. Mildly dil LA. Nl RV fxn.  Marland Kitchen GERD (gastroesophageal reflux disease)   . History of hiatal hernia   . Hyperlipidemia   . Hypertension   . Osteoarthritis   . Pneumonia   . Spinal stenosis   . UTI (lower urinary tract infection)    Past Surgical History:  Procedure Laterality Date  . ABDOMINAL HYSTERECTOMY  1973  . ABDOMINAL HYSTERECTOMY    . APPENDECTOMY    . APPLICATION OF ROBOTIC ASSISTANCE FOR SPINAL PROCEDURE N/A 09/15/2018   Procedure: APPLICATION OF ROBOTIC ASSISTANCE FOR SPINAL PROCEDURE;  Surgeon: Kristeen Miss, MD;  Location: [place];  Service: Neurosurgery;   Laterality: N/A;  . BACK SURGERY  04/17/13, 2017  . CATARACT EXTRACTION W/PHACO Left 12/16/2019   Procedure: CATARACT EXTRACTION PHACO AND INTRAOCULAR LENS PLACEMENT (IOC) LEFT PANOPTIX LENS DIABETIC 8.07 01:19.2 10.2%;  Surgeon: Leandrew Koyanagi, MD;  Location: [place];  Service: Ophthalmology;  Laterality: Left;  Diabetic - oral meds  . CHOLECYSTECTOMY  1995  . HERNIA REPAIR  08/2006  . lap band  08/2003  . SEPTOPLASTY  04/1978  . TONSILLECTOMY AND ADENOIDECTOMY  1979  . TOTAL HIP ARTHROPLASTY  02/2012   Right, Dr. Marry Guan  . TOTAL HIP ARTHROPLASTY Left 08/31/2019   Procedure: TOTAL HIP ARTHROPLASTY;  Surgeon: Dereck Leep, MD;  Location: [facility];  Service: Orthopedics;  Laterality: Left;   Family History  Problem Relation Age of Onset  . Hypertension Mother   . Stroke Mother   . Heart disease Mother   . Hypertension Father   . Stroke Father   . Heart disease Father   . Arthritis Sister   . Diabetes Sister   . Dementia Sister   . Cancer Sister   . Diabetes Brother   . Hypertension Brother   . Diabetes Brother   . Hypertension Brother   . Breast cancer Other 47  . Cancer Other        breast  . Diabetes Other   . Kidney  disease Neg Hx   . Bladder Cancer Neg Hx    Social History   Socioeconomic History  . Marital status: Married    Spouse name: Not on file  . Number of children: Not on file  . Years of education: Not on file  . Highest education level: Not on file  Occupational History  . Not on file  Tobacco Use  . Smoking status: Never Smoker  . Smokeless tobacco: Never Used  Substance and Sexual Activity  . Alcohol use: Yes    Alcohol/week: 0.0 - 1.0 standard drinks    Comment: occ  . Drug use: No  . Sexual activity: Not Currently  Other Topics Concern  . Not on file  Social History Narrative   Lives in [place] with husband.      Work - retired Sports administrator   Social Determinants of Radio broadcast assistant Strain:   .  Difficulty of Paying Living Expenses: Not on file  Food Insecurity:   . Worried About Charity fundraiser in the Last Year: Not on file  . Ran Out of Food in the Last Year: Not on file  Transportation Needs:   . Lack of Transportation (Medical): Not on file  . Lack of Transportation (Non-Medical): Not on file  Physical Activity:   . Days of Exercise per Week: Not on file  . Minutes of Exercise per Session: Not on file  Stress:   . Feeling of Stress : Not on file  Social Connections:   . Frequency of Communication with Friends and Family: Not on file  . Frequency of Social Gatherings with Friends and Family: Not on file  . Attends Religious Services: Not on file  . Active Member of Clubs or Organizations: Not on file  . Attends Archivist Meetings: Not on file  . Marital Status: Not on file    Outpatient Encounter Medications as of 12/31/2019  Medication Sig  . ALPRAZolam (XANAX) 0.25 MG tablet Take 1 tablet (0.25 mg total) by mouth daily as needed for anxiety.  . Cholecalciferol (VITAMIN D3) 2000 units TABS Take 2,000 Units by mouth daily.  . fexofenadine (ALLEGRA) 180 MG tablet Take 1 tablet (180 mg total) by mouth daily.  . fluticasone (FLONASE) 50 MCG/ACT nasal spray SPRAY 2 SPRAYS INTO EACH NOSTRIL EVERY DAY (Patient taking differently: Place 2 sprays into both nostrils at bedtime. )  . losartan-hydrochlorothiazide (HYZAAR) 50-12.5 MG tablet TAKE 1 TABLET BY MOUTH EVERY DAY (Patient taking differently: Take 1 tablet by mouth daily. )  . metFORMIN (GLUCOPHAGE) 500 MG tablet TAKE 1 TABLET BY MOUTH EVERY DAY  . metoprolol succinate (TOPROL-XL) 25 MG 24 hr tablet TAKE 1/2 TABLET BY MOUTH DAILY (Patient taking differently: Take 12.5 mg by mouth daily. )  . Multiple Vitamin (MULTIVITAMIN) tablet Take 1 tablet by mouth daily.  Glory Rosebush VERIO test strip USE TO CHECK BLOOD SUGAR TWICE A DAY  . pantoprazole (PROTONIX) 40 MG tablet TAKE 1 TABLET (40 MG TOTAL) BY MOUTH 2 (TWO)  TIMES DAILY BEFORE A MEAL.  Marland Kitchen sertraline (ZOLOFT) 100 MG tablet TAKE 1/2 TABLET BY MOUTH (50 MG) BY MOUTH DAILY (Patient taking differently: Take 50 mg by mouth daily. )  . simvastatin (ZOCOR) 10 MG tablet TAKE ONE-HALF TABLET (5 MG TOTAL) BY MOUTH AT BEDTIME. (Patient taking differently: Take 5 mg by mouth at bedtime and may repeat dose one time if needed. )  . tiZANidine (ZANAFLEX) 2 MG tablet Take 1 tablet (2  mg total) by mouth every 8 (eight) hours as needed for muscle spasms.  . traMADol (ULTRAM) 50 MG tablet Take 50 mg by mouth 2 (two) times daily.   . traZODone (DESYREL) 100 MG tablet TAKE 1 TABLET BY MOUTH EVERYDAY AT BEDTIME (Patient taking differently: Take 100 mg by mouth at bedtime. )  . vitamin B-12 (CYANOCOBALAMIN) 1000 MCG tablet Take 1,000 mcg by mouth daily.  . [DISCONTINUED] ALPRAZolam (XANAX) 0.25 MG tablet Take 1 tablet (0.25 mg total) by mouth daily as needed for anxiety.  . [DISCONTINUED] Liniments (SALONPAS EX) Apply 1 application topically daily.  . [DISCONTINUED] potassium chloride (KLOR-CON) 10 MEQ tablet Take 1 tablet (10 mEq total) by mouth daily. (Patient not taking: Reported on 12/08/2019)   No facility-administered encounter medications on file as of 12/31/2019.    Review of Systems  Constitutional: Negative for appetite change.       Significant weight loss.   HENT: Negative for congestion and sinus pressure.   Respiratory: Negative for cough, chest tightness and shortness of breath.   Cardiovascular: Negative for chest pain, palpitations and leg swelling.  Gastrointestinal: Negative for abdominal pain, nausea and vomiting.  Genitourinary: Negative for difficulty urinating and dysuria.  Musculoskeletal: Positive for back pain. Negative for myalgias.  Skin: Negative for color change and rash.  Neurological: Negative for dizziness, light-headedness and headaches.  Psychiatric/Behavioral: Negative for agitation.       Increased stress. Discussed with her today.           Objective:    Physical Exam Constitutional:      General: She is not in acute distress.    Appearance: Normal appearance.  HENT:     Head: Normocephalic and atraumatic.     Right Ear: External ear normal.     Left Ear: External ear normal.  Eyes:     General: No scleral icterus.       Right eye: No discharge.        Left eye: No discharge.     Conjunctiva/sclera: Conjunctivae normal.  Neck:     Thyroid: No thyromegaly.  Cardiovascular:     Rate and Rhythm: Normal rate and regular rhythm.  Pulmonary:     Effort: No respiratory distress.     Breath sounds: Normal breath sounds. No wheezing.  Abdominal:     General: Bowel sounds are normal.     Palpations: Abdomen is soft.     Tenderness: There is no abdominal tenderness.  Genitourinary:    Comments: Rectal - minimal decreased sphincter tone.  Heme negative.  Musculoskeletal:        General: No tenderness.     Cervical back: Neck supple. No tenderness.  Lymphadenopathy:     Cervical: No cervical adenopathy.  Skin:    Findings: No erythema or rash.  Neurological:     Mental Status: She is alert.  Psychiatric:        Mood and Affect: Mood normal.        Behavior: Behavior normal.     BP 132/78   Pulse 69   Temp (!) 97.5 F (36.4 C)   Resp 16   Wt 177 lb 9.6 oz (80.6 kg)   SpO2 99%   BMI 29.55 kg/m  Wt Readings from Last 3 Encounters:  12/31/19 177 lb 9.6 oz (80.6 kg)  12/16/19 175 lb (79.4 kg)  09/01/19 198 lb 13.7 oz (90.2 kg)     Lab Results  Component Value Date   WBC 4.6 12/31/2019  HGB 11.4 (L) 12/31/2019   HCT 34.5 (L) 12/31/2019   PLT 232.0 12/31/2019   GLUCOSE 112 (H) 12/29/2019   CHOL 178 12/29/2019   TRIG 80.0 12/29/2019   HDL 68.70 12/29/2019   LDLDIRECT 146.5 09/19/2012   LDLCALC 93 12/29/2019   ALT 12 12/29/2019   AST 15 12/29/2019   NA 141 12/29/2019   K 4.1 12/29/2019   CL 107 12/29/2019   CREATININE 0.83 12/29/2019   BUN 25 (H) 12/29/2019   CO2 29 12/29/2019   TSH  2.16 12/29/2019   INR 1.0 08/21/2019   HGBA1C 6.4 12/29/2019   MICROALBUR 0.2 02/13/2019       Assessment & Plan:   Problem List Items Addressed This Visit    Aortic atherosclerosis ([place])    On simvastatin.       Back pain    S/p lumbar decompression and extension of her prior fusion 09/15/18.  Has been followed by Dr Ellene Route. S/p hip surgery.  Per pt, no further surgery warranted at this time.  Request referral back to Dr Sharlet Salina to help manage pain.  Request refill tramadol to get her through to see Dr Sharlet Salina.  She had requested refill for tramadol. It appears that Dr Ellene Route has given rx with refill - just refilled 12/11/19.  Hold on refill at this time.        Relevant Orders   Ambulatory referral to Orthopedic Surgery   Diabetes mellitus type 2, diet-controlled ([place])    Low carb diet and exercise.  Follow met b and a1c.   Lab Results  Component Value Date   HGBA1C 6.4 12/29/2019        Essential (primary) hypertension    Blood pressure under good control.  Continue same medication regimen.  Follow pressures.  Follow metabolic panel.        GERD (gastroesophageal reflux disease)    Controlled on protonix.        Hypercholesterolemia    On simvastatin.  Low cholesterol diet and exercise.  Follow lipid panel and liver function tests.        Mild depression (HCC)    Increased stress as outlined.  On zoloft.  Request xanax to have if needed.  Rarely uses.  Discussed psych referral.  Agrees.        Relevant Medications   ALPRAZolam (XANAX) 0.25 MG tablet   Other Relevant Orders   Ambulatory referral to Psychiatry   Weight loss    Weight loss as outlined.  Bowel issues as outlined.  cologuard 2018 negative.  Refer to GI for further evaluation.        Relevant Orders   CBC with Differential/Platelet (Completed)   Vitamin B12 (Completed)   Ambulatory referral to Gastroenterology      I spent 45 minutes with pt.  Time spent discussing her concern concerns including  her back pain, increased stress and weight loss with bowel changes, etc.   [name], MD

## 2020-01-03 ENCOUNTER — Encounter: Payer: Self-pay | Admitting: Internal Medicine

## 2020-01-03 MED ORDER — ALPRAZOLAM 0.25 MG PO TABS: 0.2500 mg | ORAL_TABLET | Freq: Every day | ORAL | 0 refills | Status: DC | PRN

## 2020-01-03 NOTE — Assessment & Plan Note (Signed)
On simvastatin.   

## 2020-01-03 NOTE — Assessment & Plan Note (Signed)
Blood pressure under good control.  Continue same medication regimen.  Follow pressures.  Follow metabolic panel.   

## 2020-01-03 NOTE — Assessment & Plan Note (Signed)
Controlled on protonix.   

## 2020-01-03 NOTE — Assessment & Plan Note (Signed)
Weight loss as outlined.  Bowel issues as outlined.  cologuard 2018 negative.  Refer to GI for further evaluation.

## 2020-01-03 NOTE — Assessment & Plan Note (Signed)
Low carb diet and exercise.  Follow met b and a1c.   Lab Results  Component Value Date   HGBA1C 6.4 12/29/2019

## 2020-01-03 NOTE — Assessment & Plan Note (Addendum)
S/p lumbar decompression and extension of her prior fusion 09/15/18.  Has been followed by Dr Ellene Route. S/p hip surgery.  Per pt, no further surgery warranted at this time.  Request referral back to Dr Sharlet Salina to help manage pain.  Request refill tramadol to get her through to see Dr Sharlet Salina.  She had requested refill for tramadol. It appears that Dr Ellene Route has given rx with refill - just refilled 12/11/19.  Hold on refill at this time.

## 2020-01-03 NOTE — Assessment & Plan Note (Signed)
On simvastatin.  Low cholesterol diet and exercise.  Follow lipid panel and liver function tests.   

## 2020-01-03 NOTE — Assessment & Plan Note (Signed)
Increased stress as outlined.  On zoloft.  Request xanax to have if needed.  Rarely uses.  Discussed psych referral.  Agrees.

## 2020-01-04 ENCOUNTER — Other Ambulatory Visit: Payer: Self-pay

## 2020-01-04 ENCOUNTER — Other Ambulatory Visit
Admission: RE | Admit: 2020-01-04 | Discharge: 2020-01-04 | Disposition: A | Discharge status: Home / Self Care | Payer: Medicare HMO | Source: Ambulatory Visit | Attending: Ophthalmology | Admitting: Ophthalmology

## 2020-01-04 DIAGNOSIS — Z20822 Contact with and (suspected) exposure to covid-19: Secondary | ICD-10-CM | POA: Insufficient documentation

## 2020-01-04 DIAGNOSIS — Z01812 Encounter for preprocedural laboratory examination: Secondary | ICD-10-CM | POA: Diagnosis present

## 2020-01-04 NOTE — Telephone Encounter (Signed)
Please call and confirm pt doing ok.  Will need records if can find out where evaluated.  Let us know if needs anything.

## 2020-01-04 NOTE — Telephone Encounter (Signed)
Called to get update. Patient doing ok but not really any better yet since starting abx. Was given cipro for 14 days. Patient stated that he has only taken 3. Requested records from [facility]. Pt has let his urologist know as well but has not heard anything back from them.

## 2020-01-05 LAB — SARS CORONAVIRUS 2 (TAT 6-24 HRS): SARS Coronavirus 2: NEGATIVE

## 2020-01-05 NOTE — Discharge Instructions (Signed)

## 2020-01-06 ENCOUNTER — Ambulatory Visit
Admission: RE | Admit: 2020-01-06 | Discharge: 2020-01-06 | Disposition: A | Discharge status: Home / Self Care | Payer: Medicare HMO | Attending: Ophthalmology | Admitting: Ophthalmology

## 2020-01-06 ENCOUNTER — Encounter
Admission: RE | Disposition: A | Discharge status: Home / Self Care | Payer: Self-pay | Source: Home / Self Care | Attending: Ophthalmology

## 2020-01-06 ENCOUNTER — Ambulatory Visit: Payer: Medicare HMO | Admitting: Anesthesiology

## 2020-01-06 ENCOUNTER — Other Ambulatory Visit: Payer: Self-pay

## 2020-01-06 ENCOUNTER — Encounter: Payer: Self-pay | Admitting: Ophthalmology

## 2020-01-06 DIAGNOSIS — E7800 Pure hypercholesterolemia, unspecified: Secondary | ICD-10-CM | POA: Diagnosis not present

## 2020-01-06 DIAGNOSIS — M48062 Spinal stenosis, lumbar region with neurogenic claudication: Secondary | ICD-10-CM | POA: Insufficient documentation

## 2020-01-06 DIAGNOSIS — Z9884 Bariatric surgery status: Secondary | ICD-10-CM | POA: Diagnosis not present

## 2020-01-06 DIAGNOSIS — H2511 Age-related nuclear cataract, right eye: Secondary | ICD-10-CM | POA: Insufficient documentation

## 2020-01-06 DIAGNOSIS — K219 Gastro-esophageal reflux disease without esophagitis: Secondary | ICD-10-CM | POA: Diagnosis not present

## 2020-01-06 DIAGNOSIS — E669 Obesity, unspecified: Secondary | ICD-10-CM | POA: Diagnosis not present

## 2020-01-06 DIAGNOSIS — E119 Type 2 diabetes mellitus without complications: Secondary | ICD-10-CM | POA: Diagnosis not present

## 2020-01-06 DIAGNOSIS — F419 Anxiety disorder, unspecified: Secondary | ICD-10-CM | POA: Insufficient documentation

## 2020-01-06 DIAGNOSIS — M1990 Unspecified osteoarthritis, unspecified site: Secondary | ICD-10-CM | POA: Diagnosis not present

## 2020-01-06 DIAGNOSIS — I10 Essential (primary) hypertension: Secondary | ICD-10-CM | POA: Insufficient documentation

## 2020-01-06 DIAGNOSIS — F329 Major depressive disorder, single episode, unspecified: Secondary | ICD-10-CM | POA: Insufficient documentation

## 2020-01-06 DIAGNOSIS — E785 Hyperlipidemia, unspecified: Secondary | ICD-10-CM | POA: Diagnosis not present

## 2020-01-06 DIAGNOSIS — Z79899 Other long term (current) drug therapy: Secondary | ICD-10-CM | POA: Insufficient documentation

## 2020-01-06 DIAGNOSIS — H25811 Combined forms of age-related cataract, right eye: Secondary | ICD-10-CM | POA: Diagnosis not present

## 2020-01-06 DIAGNOSIS — Z7984 Long term (current) use of oral hypoglycemic drugs: Secondary | ICD-10-CM | POA: Diagnosis not present

## 2020-01-06 DIAGNOSIS — Z6829 Body mass index (BMI) 29.0-29.9, adult: Secondary | ICD-10-CM | POA: Insufficient documentation

## 2020-01-06 DIAGNOSIS — I700 Atherosclerosis of aorta: Secondary | ICD-10-CM | POA: Insufficient documentation

## 2020-01-06 DIAGNOSIS — R69 Illness, unspecified: Secondary | ICD-10-CM | POA: Diagnosis not present

## 2020-01-06 HISTORY — PX: CATARACT EXTRACTION W/PHACO: SHX586

## 2020-01-06 LAB — GLUCOSE, CAPILLARY
Glucose-Capillary: 111 mg/dL — ABNORMAL HIGH (ref 70–99)
Glucose-Capillary: 118 mg/dL — ABNORMAL HIGH (ref 70–99)

## 2020-01-06 SURGERY — CATARACT EXTRACTION PHACO AND INTRAOCULAR LENS PLACEMENT (IOC)
Anesthesia: Monitor Anesthesia Care | Site: Eye | Laterality: Right

## 2020-01-06 MED ORDER — LIDOCAINE HCL (PF) 2 % IJ SOLN
INTRAOCULAR | Status: DC | PRN
  Administered 2020-01-06: 2 mL

## 2020-01-06 MED ORDER — BRIMONIDINE TARTRATE-TIMOLOL 0.2-0.5 % OP SOLN
OPHTHALMIC | Status: DC | PRN
  Administered 2020-01-06: 1 [drp] via OPHTHALMIC

## 2020-01-06 MED ORDER — FENTANYL CITRATE (PF) 100 MCG/2ML IJ SOLN
INTRAMUSCULAR | Status: DC | PRN
  Administered 2020-01-06: 50 ug via INTRAVENOUS

## 2020-01-06 MED ORDER — CEFUROXIME OPHTHALMIC INJECTION 1 MG/0.1 ML
INJECTION | OPHTHALMIC | Status: DC | PRN
  Administered 2020-01-06: 0.1 mL via INTRACAMERAL

## 2020-01-06 MED ORDER — ACETAMINOPHEN 160 MG/5ML PO SOLN: 325.0000 mg | ORAL | Status: DC | PRN

## 2020-01-06 MED ORDER — MOXIFLOXACIN HCL 0.5 % OP SOLN
1.0000 [drp] | OPHTHALMIC | Status: DC | PRN
  Administered 2020-01-06 (×3): 1 [drp] via OPHTHALMIC

## 2020-01-06 MED ORDER — MIDAZOLAM HCL 2 MG/2ML IJ SOLN
INTRAMUSCULAR | Status: DC | PRN
  Administered 2020-01-06: 2 mg via INTRAVENOUS

## 2020-01-06 MED ORDER — NA HYALUR & NA CHOND-NA HYALUR 0.4-0.35 ML IO KIT
PACK | INTRAOCULAR | Status: DC | PRN
  Administered 2020-01-06: 1 mL via INTRAOCULAR

## 2020-01-06 MED ORDER — TETRACAINE HCL 0.5 % OP SOLN
1.0000 [drp] | OPHTHALMIC | Status: DC | PRN
  Administered 2020-01-06 (×3): 1 [drp] via OPHTHALMIC

## 2020-01-06 MED ORDER — EPINEPHRINE PF 1 MG/ML IJ SOLN
INTRAOCULAR | Status: DC | PRN
  Administered 2020-01-06: 08:00:00 74 mL via OPHTHALMIC

## 2020-01-06 MED ORDER — ACETAMINOPHEN 325 MG PO TABS: 325.0000 mg | ORAL_TABLET | ORAL | Status: DC | PRN

## 2020-01-06 MED ORDER — ARMC OPHTHALMIC DILATING DROPS
1.0000 "application " | OPHTHALMIC | Status: DC | PRN
  Administered 2020-01-06 (×3): 1 "application " via OPHTHALMIC

## 2020-01-06 SURGICAL SUPPLY — 21 items
CANNULA ANT/CHMB 27GA (MISCELLANEOUS) ×2 IMPLANT
GLOVE SURG LX 7.5 STRW (GLOVE) ×1
GLOVE SURG LX STRL 7.5 STRW (GLOVE) ×1
GLOVE SURG TRIUMPH 8.0 PF LTX (GLOVE) ×2
GOWN STRL REUS W/ TWL LRG LVL3 (GOWN DISPOSABLE) ×2
GOWN STRL REUS W/TWL LRG LVL3 (GOWN DISPOSABLE) ×2
LENS IOL IQ PANOPTIX 21.5 ×2 IMPLANT
MARKER SKIN DUAL TIP RULER LAB (MISCELLANEOUS) ×2
NDL CAPSULORHEX 25GA (NEEDLE) ×1 IMPLANT
NDL FILTER BLUNT 18X1 1/2 (NEEDLE) ×2 IMPLANT
NEEDLE CAPSULORHEX 25GA (NEEDLE) ×2
NEEDLE FILTER BLUNT 18X 1/2SAF (NEEDLE) ×2
NEEDLE FILTER BLUNT 18X1 1/2 (NEEDLE) ×2
PACK CATARACT BRASINGTON (MISCELLANEOUS) ×2
PACK EYE AFTER SURG (MISCELLANEOUS) ×2
PACK OPTHALMIC (MISCELLANEOUS) ×2
SOLUTION OPHTHALMIC SALT (MISCELLANEOUS) ×2
SYR 3ML LL SCALE MARK (SYRINGE) ×4
SYR TB 1ML LUER SLIP (SYRINGE) ×2
WATER STERILE IRR 250ML POUR (IV SOLUTION) ×2
WIPE NON LINTING 3.25X3.25 (MISCELLANEOUS) ×2

## 2020-01-06 NOTE — Anesthesia Preprocedure Evaluation (Signed)
Anesthesia Evaluation  Patient identified by MRN, date of birth, ID band Patient awake    Reviewed: Allergy & Precautions, NPO status , Patient's Chart, lab work & pertinent test results, reviewed documented beta blocker date and time   Airway Mallampati: II  TM Distance: >3 FB Neck ROM: Full    Dental no notable dental hx.    Pulmonary shortness of breath, pneumonia,    Pulmonary exam normal breath sounds clear to auscultation       Cardiovascular Exercise Tolerance: Good hypertension, Normal cardiovascular exam Rhythm:Regular Rate:Normal     Neuro/Psych PSYCHIATRIC DISORDERS Anxiety Depression  Neuromuscular disease    GI/Hepatic hiatal hernia, GERD  ,  Endo/Other  diabetes  Renal/GU negative Renal ROS  negative genitourinary   Musculoskeletal  (+) Arthritis ,   Abdominal Normal abdominal exam  (+) - obese,   Peds negative pediatric ROS (+)  Hematology negative hematology ROS (+)   Anesthesia Other Findings   Reproductive/Obstetrics negative OB ROS                             Anesthesia Physical  Anesthesia Plan  ASA: III  Anesthesia Plan: MAC   Post-op Pain Management:    Induction: Intravenous  PONV Risk Score and Plan: 2 and TIVA, Treatment may vary due to age or medical condition and Midazolam  Airway Management Planned: Nasal Cannula and Natural Airway  Additional Equipment:   Intra-op Plan:   Post-operative Plan:   Informed Consent: I have reviewed the patients History and Physical, chart, labs and discussed the procedure including the risks, benefits and alternatives for the proposed anesthesia with the patient or authorized representative who has indicated his/her understanding and acceptance.     Dental advisory given  Plan Discussed with: CRNA, Anesthesiologist and Surgeon  Anesthesia Plan Comments:         Anesthesia Quick Evaluation  Patient  Active Problem List   Diagnosis Date Noted  . Weight loss 12/31/2019  . H/O total hip arthroplasty 08/31/2019  . Early satiety 07/05/2019  . Spinal stenosis 09/15/2018  . Lumbar stenosis with neurogenic claudication 09/15/2018  . Shortness of breath 06/19/2018  . Cough 04/13/2018  . Aortic atherosclerosis ([place]) 01/11/2018  . GERD (gastroesophageal reflux disease) 12/23/2016  . Atrophic vaginitis 03/18/2016  . Spondylolisthesis at L5-S1 level 12/06/2015  . Rhinitis, allergic 11/22/2015  . Right leg pain 07/27/2014  . 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 Bilateral shoulder pain 02/24/2014  . Skin lesion 01/18/2014  . History of bariatric surgery 01/18/2014  . Back pain 05/25/2013  . Mild depression ([place]) 05/14/2013  . Major depressive disorder with single episode 05/14/2013  . Status post lumbar spine operation 04/17/2013  . Healthcare maintenance 01/07/2013  . Hypercholesterolemia 08/22/2012  . HLD (hyperlipidemia) 08/22/2012  . Hypertension 07/07/2012  . Diabetes mellitus type 2, diet-controlled ([place]) 07/07/2012  . Insomnia 07/07/2012  . Obesity (BMI 30-39.9) 07/07/2012  . Essential (primary) hypertension 07/07/2012    CBC Latest Ref Rng & Units 12/31/2019 08/21/2019 02/13/2019  WBC 4.0 - 10.5 K/uL 4.6 4.6 8.6  Hemoglobin 12.0 - 15.0 g/dL 11.4(L) 12.0 12.6  Hematocrit 36.0 - 46.0 % 34.5(L) 37.0 37.7  Platelets 150.0 - 400.0 K/uL 232.0 190 251   BMP Latest Ref Rng & Units 12/29/2019 08/21/2019 07/14/2019  Glucose 70 - 99 mg/dL 112(H) 116(H) 122(H)  BUN 6 - 23 mg/dL 25(H) 20 18  Creatinine 0.40 -  1.20 mg/dL 0.83 0.67 0.88  BUN/Creat Ratio 6 - 22 (calc) - - -  Sodium 135 - 145 mEq/L 141 140 141  Potassium 3.5 - 5.1 mEq/L 4.1 3.3(L) 3.7  Chloride 96 - 112 mEq/L 107 104 104  CO2 19 - 32 mEq/L _0 Calcium 8.4 - 10.5 mg/dL 10.0 9.7 10.0    Risks and benefits of anesthesia discussed at length, patient or surrogate demonstrates  understanding. Appropriately NPO. Plan to proceed with anesthesia.  Champ Mungo, MD 01/06/20

## 2020-01-06 NOTE — H&P (Signed)

## 2020-01-06 NOTE — Anesthesia Procedure Notes (Signed)
Procedure Name: MAC Performed by: Deyanira Fesler, CRNA Pre-anesthesia Checklist: Patient identified, Emergency Drugs available, Suction available, Timeout performed and Patient being monitored Patient Re-evaluated:Patient Re-evaluated prior to induction Oxygen Delivery Method: Nasal cannula Placement Confirmation: positive ETCO2       

## 2020-01-06 NOTE — Transfer of Care (Signed)
Immediate Anesthesia Transfer of Care Note  Patient: Erin Camacho  Procedure(s) Performed: CATARACT EXTRACTION PHACO AND INTRAOCULAR LENS PLACEMENT (IOC) PANOPTIX LENS RIGHT DIABETIC 10.73 01:08.6 15.7% (Right Eye)  Patient Location: PACU  Anesthesia Type: MAC  Level of Consciousness: awake, alert  and patient cooperative  Airway and Oxygen Therapy: Patient Spontanous Breathing and Patient connected to supplemental oxygen  Post-op Assessment: Post-op Vital signs reviewed, Patient's Cardiovascular Status Stable, Respiratory Function Stable, Patent Airway and No signs of Nausea or vomiting  Post-op Vital Signs: Reviewed and stable  Complications: No apparent anesthesia complications

## 2020-01-06 NOTE — Anesthesia Postprocedure Evaluation (Signed)
Anesthesia Post Note  Patient: Erin Camacho  Procedure(s) Performed: CATARACT EXTRACTION PHACO AND INTRAOCULAR LENS PLACEMENT (IOC) PANOPTIX LENS RIGHT DIABETIC 10.73 01:08.6 15.7% (Right Eye)     Patient location during evaluation: PACU Anesthesia Type: MAC Level of consciousness: awake and alert Pain management: pain level controlled Vital Signs Assessment: post-procedure vital signs reviewed and stable Respiratory status: spontaneous breathing, nonlabored ventilation, respiratory function stable and patient connected to nasal cannula oxygen Cardiovascular status: stable and blood pressure returned to baseline Postop Assessment: no apparent nausea or vomiting Anesthetic complications: no    Wanda Plump Kealan Buchan

## 2020-01-06 NOTE — Op Note (Signed)
LOCATION:  [place]   PREOPERATIVE DIAGNOSIS:    Nuclear sclerotic cataract right eye. H25.11   POSTOPERATIVE DIAGNOSIS:  Nuclear sclerotic cataract right eye.     PROCEDURE:  Phacoemusification with posterior chamber intraocular lens placement of the right eye   ULTRASOUND TIME: Procedure(s) with comments: CATARACT EXTRACTION PHACO AND INTRAOCULAR LENS PLACEMENT (IOC) PANOPTIX LENS RIGHT DIABETIC 10.73 01:08.6 15.7% (Right) - diabetic  LENS:   Implant Name Type Inv. Item Serial No. Manufacturer Lot No. LRB No. Used Action  Acry[employer]of IQ PanOptix [id number] Intraocular Lens  [id number]   Right 1 Implanted         SURGEON:  Wyonia Hough, MD   ANESTHESIA:  Topical with tetracaine drops and 2% Xylocaine jelly, augmented with 1% preservative-free intracameral lidocaine.    COMPLICATIONS:  None.   DESCRIPTION OF PROCEDURE:  The patient was identified in the holding room and transported to the operating room and placed in the supine position under the operating microscope.  The right eye was identified as the operative eye and it was prepped and draped in the usual sterile ophthalmic fashion.   A 1 millimeter clear-corneal paracentesis was made at the 12:00 position.  0.5 ml of preservative-free 1% lidocaine was injected into the anterior chamber. The anterior chamber was filled with Viscoat viscoelastic.  A 2.4 millimeter keratome was used to make a near-clear corneal incision at the 9:00 position.  A curvilinear capsulorrhexis was made with a cystotome and capsulorrhexis forceps.  Balanced salt solution was used to hydrodissect and hydrodelineate the nucleus.   Phacoemulsification was then used in stop and chop fashion to remove the lens nucleus and epinucleus.  The remaining cortex was then removed using the irrigation and aspiration handpiece. Provisc was then placed into the capsular bag to distend it for lens placement.  A lens was then injected into the capsular bag.   The remaining viscoelastic was aspirated.   Wounds were hydrated with balanced salt solution.  The anterior chamber was inflated to a physiologic pressure with balanced salt solution.  No wound leaks were noted. Cefuroxime 0.1 ml of a 10mg /ml solution was injected into the anterior chamber for a dose of 1 mg of intracameral antibiotic at the completion of the case.   Timolol and Brimonidine drops were applied to the eye.  The patient was taken to the recovery room in stable condition without complications of anesthesia or surgery.   Koriana Stepien 01/06/2020, 8:31 AM

## 2020-01-07 ENCOUNTER — Encounter: Payer: Self-pay | Admitting: *Deleted

## 2020-01-07 NOTE — Telephone Encounter (Signed)
Patient was seen by urology. Advised to continue cipro. Still waiting on records from [facility]. Daughter forwarded urine culture via my chart.

## 2020-01-08 ENCOUNTER — Other Ambulatory Visit: Payer: Self-pay | Admitting: Internal Medicine

## 2020-01-08 DIAGNOSIS — D649 Anemia, unspecified: Secondary | ICD-10-CM

## 2020-01-08 NOTE — Progress Notes (Signed)
Orders placed for f/u lab 

## 2020-01-12 DIAGNOSIS — J301 Allergic rhinitis due to pollen: Secondary | ICD-10-CM | POA: Diagnosis not present

## 2020-01-13 ENCOUNTER — Other Ambulatory Visit: Payer: Self-pay | Admitting: Internal Medicine

## 2020-01-13 DIAGNOSIS — D649 Anemia, unspecified: Secondary | ICD-10-CM

## 2020-01-13 NOTE — Progress Notes (Signed)
Orders placed for labs - [facility]

## 2020-01-14 ENCOUNTER — Other Ambulatory Visit: Payer: Medicare HMO

## 2020-01-14 ENCOUNTER — Other Ambulatory Visit
Admission: RE | Admit: 2020-01-14 | Discharge: 2020-01-14 | Disposition: A | Discharge status: Home / Self Care | Payer: Medicare HMO | Source: Ambulatory Visit | Attending: Internal Medicine | Admitting: Internal Medicine

## 2020-01-14 DIAGNOSIS — D649 Anemia, unspecified: Secondary | ICD-10-CM | POA: Diagnosis present

## 2020-01-14 LAB — CBC WITH DIFFERENTIAL/PLATELET
Abs Immature Granulocytes: 0.01 10*3/uL (ref 0.00–0.07)
Basophils Absolute: 0 10*3/uL (ref 0.0–0.1)
Basophils Relative: 1 %
Eosinophils Absolute: 0.1 10*3/uL (ref 0.0–0.5)
Eosinophils Relative: 3 %
HCT: 36.5 % (ref 36.0–46.0)
Hemoglobin: 11.4 g/dL — ABNORMAL LOW (ref 12.0–15.0)
Immature Granulocytes: 0 %
Lymphocytes Relative: 30 %
Lymphs Abs: 1.4 10*3/uL (ref 0.7–4.0)
MCH: 28.7 pg (ref 26.0–34.0)
MCHC: 31.2 g/dL (ref 30.0–36.0)
MCV: 91.9 fL (ref 80.0–100.0)
Monocytes Absolute: 0.4 10*3/uL (ref 0.1–1.0)
Monocytes Relative: 9 %
Neutro Abs: 2.8 10*3/uL (ref 1.7–7.7)
Neutrophils Relative %: 57 %
Platelets: 204 10*3/uL (ref 150–400)
RBC: 3.97 MIL/uL (ref 3.87–5.11)
RDW: 16.7 % — ABNORMAL HIGH (ref 11.5–15.5)
WBC: 4.8 10*3/uL (ref 4.0–10.5)
nRBC: 0 % (ref 0.0–0.2)

## 2020-01-14 LAB — IRON AND TIBC
Iron: 97 ug/dL (ref 28–170)
Saturation Ratios: 25 % (ref 10.4–31.8)
TIBC: 396 ug/dL (ref 250–450)
UIBC: 299 ug/dL

## 2020-01-14 LAB — FERRITIN: Ferritin: 22 ng/mL (ref 11–307)

## 2020-01-15 ENCOUNTER — Other Ambulatory Visit: Payer: Self-pay | Admitting: Internal Medicine

## 2020-01-15 ENCOUNTER — Other Ambulatory Visit: Payer: Medicare HMO

## 2020-01-15 DIAGNOSIS — D649 Anemia, unspecified: Secondary | ICD-10-CM

## 2020-01-15 NOTE — Progress Notes (Signed)
Order placed for f/u labs.  

## 2020-01-26 DIAGNOSIS — J301 Allergic rhinitis due to pollen: Secondary | ICD-10-CM | POA: Diagnosis not present

## 2020-01-28 ENCOUNTER — Telehealth: Payer: Self-pay | Admitting: Internal Medicine

## 2020-01-28 DIAGNOSIS — M545 Low back pain, unspecified: Secondary | ICD-10-CM

## 2020-01-28 NOTE — Telephone Encounter (Signed)
Patient will need a referral to [facility] pain clinic, please send notes on why. Would like Dr. Andree Elk if possible.

## 2020-01-29 NOTE — Telephone Encounter (Signed)
Pt stated she needs referral to [facility] pain clinic for the muscle pain in her back. Dr Sharlet Salina declined her as a pt due to the hardware in her back. Stated she does not necessarily need to see Dr Andree Elk but does need the referral put in so they can get her established

## 2020-01-31 DIAGNOSIS — R69 Illness, unspecified: Secondary | ICD-10-CM | POA: Diagnosis not present

## 2020-01-31 NOTE — Telephone Encounter (Signed)
Order placed for referral to pain clinic.

## 2020-02-02 DIAGNOSIS — J301 Allergic rhinitis due to pollen: Secondary | ICD-10-CM | POA: Diagnosis not present

## 2020-02-05 DIAGNOSIS — J301 Allergic rhinitis due to pollen: Secondary | ICD-10-CM | POA: Diagnosis not present

## 2020-02-09 DIAGNOSIS — J301 Allergic rhinitis due to pollen: Secondary | ICD-10-CM | POA: Diagnosis not present

## 2020-02-15 DIAGNOSIS — K219 Gastro-esophageal reflux disease without esophagitis: Secondary | ICD-10-CM | POA: Diagnosis not present

## 2020-02-15 DIAGNOSIS — K449 Diaphragmatic hernia without obstruction or gangrene: Secondary | ICD-10-CM | POA: Diagnosis not present

## 2020-02-15 DIAGNOSIS — R634 Abnormal weight loss: Secondary | ICD-10-CM | POA: Diagnosis not present

## 2020-02-15 DIAGNOSIS — K224 Dyskinesia of esophagus: Secondary | ICD-10-CM | POA: Diagnosis not present

## 2020-02-15 DIAGNOSIS — R1310 Dysphagia, unspecified: Secondary | ICD-10-CM | POA: Diagnosis not present

## 2020-02-15 DIAGNOSIS — R69 Illness, unspecified: Secondary | ICD-10-CM | POA: Diagnosis not present

## 2020-02-16 DIAGNOSIS — J301 Allergic rhinitis due to pollen: Secondary | ICD-10-CM | POA: Diagnosis not present

## 2020-02-17 ENCOUNTER — Other Ambulatory Visit (INDEPENDENT_AMBULATORY_CARE_PROVIDER_SITE_OTHER): Payer: Medicare HMO

## 2020-02-17 ENCOUNTER — Other Ambulatory Visit: Payer: Self-pay

## 2020-02-17 DIAGNOSIS — D649 Anemia, unspecified: Secondary | ICD-10-CM

## 2020-02-17 LAB — CBC WITH DIFFERENTIAL/PLATELET
Basophils Absolute: 0 10*3/uL (ref 0.0–0.1)
Basophils Relative: 0.7 % (ref 0.0–3.0)
Eosinophils Absolute: 0.1 10*3/uL (ref 0.0–0.7)
Eosinophils Relative: 3.1 % (ref 0.0–5.0)
HCT: 36.8 % (ref 36.0–46.0)
Hemoglobin: 12.2 g/dL (ref 12.0–15.0)
Lymphocytes Relative: 34.7 % (ref 12.0–46.0)
Lymphs Abs: 1.5 10*3/uL (ref 0.7–4.0)
MCHC: 33.3 g/dL (ref 30.0–36.0)
MCV: 91.5 fl (ref 78.0–100.0)
Monocytes Absolute: 0.5 10*3/uL (ref 0.1–1.0)
Monocytes Relative: 10.3 % (ref 3.0–12.0)
Neutro Abs: 2.2 10*3/uL (ref 1.4–7.7)
Neutrophils Relative %: 51.2 % (ref 43.0–77.0)
Platelets: 188 10*3/uL (ref 150.0–400.0)
RBC: 4.02 Mil/uL (ref 3.87–5.11)
RDW: 16.2 % — ABNORMAL HIGH (ref 11.5–15.5)
WBC: 4.4 10*3/uL (ref 4.0–10.5)

## 2020-02-17 LAB — IBC + FERRITIN
Iron: 72 ug/dL (ref 42–145)
Saturation Ratios: 20.8 % (ref 20.0–50.0)
Transferrin: 247 mg/dL (ref 212.0–360.0)

## 2020-02-17 NOTE — Addendum Note (Signed)
Addended by: Tor Netters I on: 02/17/2020 01:52 PM   Modules accepted: Orders

## 2020-02-18 ENCOUNTER — Encounter: Payer: Self-pay | Admitting: Internal Medicine

## 2020-02-18 LAB — IRON, TOTAL/TOTAL IRON BINDING CAP
%SAT: 24 % (ref 16–45)
Iron: 75 ug/dL (ref 45–160)
TIBC: 313 ug/dL (ref 250–450)

## 2020-02-19 ENCOUNTER — Other Ambulatory Visit: Payer: Self-pay | Admitting: Internal Medicine

## 2020-02-22 DIAGNOSIS — M65331 Trigger finger, right middle finger: Secondary | ICD-10-CM | POA: Diagnosis not present

## 2020-02-23 DIAGNOSIS — J301 Allergic rhinitis due to pollen: Secondary | ICD-10-CM | POA: Diagnosis not present

## 2020-02-25 ENCOUNTER — Other Ambulatory Visit: Payer: Self-pay

## 2020-02-25 ENCOUNTER — Ambulatory Visit (INDEPENDENT_AMBULATORY_CARE_PROVIDER_SITE_OTHER): Payer: Medicare HMO | Admitting: Internal Medicine

## 2020-02-25 VITALS — BP 124/72 | HR 69 | Temp 96.7°F | Resp 16 | Ht 65.0 in | Wt 177.0 lb

## 2020-02-25 DIAGNOSIS — F320 Major depressive disorder, single episode, mild: Secondary | ICD-10-CM

## 2020-02-25 DIAGNOSIS — R69 Illness, unspecified: Secondary | ICD-10-CM | POA: Diagnosis not present

## 2020-02-25 DIAGNOSIS — I700 Atherosclerosis of aorta: Secondary | ICD-10-CM | POA: Diagnosis not present

## 2020-02-25 DIAGNOSIS — I10 Essential (primary) hypertension: Secondary | ICD-10-CM

## 2020-02-25 DIAGNOSIS — R634 Abnormal weight loss: Secondary | ICD-10-CM

## 2020-02-25 DIAGNOSIS — M545 Low back pain, unspecified: Secondary | ICD-10-CM

## 2020-02-25 DIAGNOSIS — E7800 Pure hypercholesterolemia, unspecified: Secondary | ICD-10-CM | POA: Diagnosis not present

## 2020-02-25 DIAGNOSIS — K219 Gastro-esophageal reflux disease without esophagitis: Secondary | ICD-10-CM

## 2020-02-25 DIAGNOSIS — D649 Anemia, unspecified: Secondary | ICD-10-CM

## 2020-02-25 DIAGNOSIS — E119 Type 2 diabetes mellitus without complications: Secondary | ICD-10-CM | POA: Diagnosis not present

## 2020-02-25 DIAGNOSIS — F32A Depression, unspecified: Secondary | ICD-10-CM

## 2020-02-25 MED ORDER — TRAMADOL HCL 50 MG PO TABS: 50.0000 mg | ORAL_TABLET | Freq: Two times a day (BID) | ORAL | 0 refills | Status: DC | PRN

## 2020-02-25 NOTE — Progress Notes (Signed)
Patient ID: Erin Camacho, female   DOB: 16-Dec-1945, 74 y.o.   MRN: [id number]   Subjective:    Patient ID: Erin Camacho, female    DOB: February 14, 1946, 74 y.o.   MRN: [id number]  HPI This visit occurred during the SARS-CoV-2 public health emergency.  Safety protocols were in place, including screening questions prior to the visit, additional usage of staff PPE, and extensive cleaning of exam room while observing appropriate contact time as indicated for disinfecting solutions.  Patient here for a scheduled follow up. She reports she is having increased pain - left hip and left leg.  States her left hip has been an issues since her surgery.  Left leg - flared again over the last few weeks.  Has some left over hydrocodone.  Has been taking bid.  Was taking tramadol.  Request refill, until she can get in with pain clinic.  Affecting her walking.  No chest pain or sob reported.  Did received cortisone injection - right middle finger.  Weight is stable.  Reported previous decrease appetite secondary to increased pain.  Some increased acid reflux.  Discussed increasing protonix to bid.  Take miralax to keep bowels regular.  Increased stress.  Discussed with her today.  Discussed psych referral. She feels she is doing better.  Blood sugars in am averaging 90-100 and pm sugars 130-140.    Past Medical History:  Diagnosis Date  . Allergy    hay fever  . Anxiety   . Arthritis   . Chronic back pain   . Diabetes mellitus   . Dyspnea    a. 07/2017 Echo: EF 60-65%. No rwma. Mildly dil LA. Nl RV fxn.  Marland Kitchen GERD (gastroesophageal reflux disease)   . History of hiatal hernia   . Hyperlipidemia   . Hypertension   . Osteoarthritis   . Pneumonia   . Spinal stenosis   . UTI (lower urinary tract infection)    Past Surgical History:  Procedure Laterality Date  . ABDOMINAL HYSTERECTOMY  1973  . ABDOMINAL HYSTERECTOMY    . APPENDECTOMY    . APPLICATION OF ROBOTIC ASSISTANCE FOR SPINAL PROCEDURE N/A 09/15/2018   Procedure: APPLICATION OF ROBOTIC ASSISTANCE FOR SPINAL PROCEDURE;  Surgeon: Kristeen Miss, MD;  Location: [facility];  Service: Neurosurgery;  Laterality: N/A;  . BACK SURGERY  04/17/13, 2017  . CATARACT EXTRACTION W/PHACO Left 12/16/2019   Procedure: CATARACT EXTRACTION PHACO AND INTRAOCULAR LENS PLACEMENT (IOC) LEFT PANOPTIX LENS DIABETIC 8.07 01:19.2 10.2%;  Surgeon: Leandrew Koyanagi, MD;  Location: [facility];  Service: Ophthalmology;  Laterality: Left;  Diabetic - oral meds  . CATARACT EXTRACTION W/PHACO Right 01/06/2020   Procedure: CATARACT EXTRACTION PHACO AND INTRAOCULAR LENS PLACEMENT (IOC) PANOPTIX LENS RIGHT DIABETIC 10.73 01:08.6 15.7%;  Surgeon: Leandrew Koyanagi, MD;  Location: [facility];  Service: Ophthalmology;  Laterality: Right;  diabetic  . CHOLECYSTECTOMY  1995  . HERNIA REPAIR  08/2006  . lap band  08/2003  . SEPTOPLASTY  04/1978  . TONSILLECTOMY AND ADENOIDECTOMY  1979  . TOTAL HIP ARTHROPLASTY  02/2012   Right, Dr. Marry Guan  . TOTAL HIP ARTHROPLASTY Left 08/31/2019   Procedure: TOTAL HIP ARTHROPLASTY;  Surgeon: Dereck Leep, MD;  Location: [facility];  Service: Orthopedics;  Laterality: Left;   Family History  Problem Relation Age of Onset  . Hypertension Mother   . Stroke Mother   . Heart disease Mother   . Hypertension Father   . Stroke Father   . Heart disease  Father   . Arthritis Sister   . Diabetes Sister   . Dementia Sister   . Cancer Sister   . Diabetes Brother   . Hypertension Brother   . Diabetes Brother   . Hypertension Brother   . Breast cancer Other 47  . Cancer Other        breast  . Diabetes Other   . Kidney disease Neg Hx   . Bladder Cancer Neg Hx    Social History   Socioeconomic History  . Marital status: Married    Spouse name: Not on file  . Number of children: Not on file  . Years of education: Not on file  . Highest education level: Not on file  Occupational History  . Not on file  Tobacco Use  . Smoking  status: Never Smoker  . Smokeless tobacco: Never Used  Substance and Sexual Activity  . Alcohol use: Yes    Alcohol/week: 0.0 - 1.0 standard drinks    Comment: occ  . Drug use: No  . Sexual activity: Not Currently  Other Topics Concern  . Not on file  Social History Narrative   Lives in [place] with husband.      Work - retired Emergency planning/management officer Determinants of Radio broadcast assistant Strain:   . Difficulty of Paying Living Expenses:   Food Insecurity:   . Worried About Charity fundraiser in the Last Year:   . Arboriculturist in the Last Year:   Transportation Needs:   . Film/video editor (Medical):   Marland Kitchen Lack of Transportation (Non-Medical):   Physical Activity:   . Days of Exercise per Week:   . Minutes of Exercise per Session:   Stress:   . Feeling of Stress :   Social Connections:   . Frequency of Communication with Friends and Family:   . Frequency of Social Gatherings with Friends and Family:   . Attends Religious Services:   . Active Member of Clubs or Organizations:   . Attends Archivist Meetings:   Marland Kitchen Marital Status:     Outpatient Encounter Medications as of 02/25/2020  Medication Sig  . ALPRAZolam (XANAX) 0.25 MG tablet Take 1 tablet (0.25 mg total) by mouth daily as needed for anxiety.  . Cholecalciferol (VITAMIN D3) 2000 units TABS Take 2,000 Units by mouth daily.  . fexofenadine (ALLEGRA) 180 MG tablet Take 1 tablet (180 mg total) by mouth daily.  . fluticasone (FLONASE) 50 MCG/ACT nasal spray SPRAY 2 SPRAYS INTO EACH NOSTRIL EVERY DAY (Patient taking differently: Place 2 sprays into both nostrils at bedtime. )  . losartan-hydrochlorothiazide (HYZAAR) 50-12.5 MG tablet TAKE 1 TABLET BY MOUTH EVERY DAY (Patient taking differently: Take 1 tablet by mouth daily. )  . metFORMIN (GLUCOPHAGE) 500 MG tablet TAKE 1 TABLET BY MOUTH EVERY DAY  . metoprolol succinate (TOPROL-XL) 25 MG 24 hr tablet TAKE 1/2 TABLET BY MOUTH DAILY (Patient  taking differently: Take 12.5 mg by mouth daily. )  . Multiple Vitamin (MULTIVITAMIN) tablet Take 1 tablet by mouth daily.  Glory Rosebush VERIO test strip USE TO CHECK BLOOD SUGAR TWICE A DAY  . pantoprazole (PROTONIX) 40 MG tablet TAKE 1 TABLET (40 MG TOTAL) BY MOUTH 2 (TWO) TIMES DAILY BEFORE A MEAL.  Marland Kitchen sertraline (ZOLOFT) 100 MG tablet TAKE 1/2 TABLET BY MOUTH (50 MG) BY MOUTH DAILY (Patient taking differently: Take 50 mg by mouth daily. )  . simvastatin (ZOCOR) 10 MG tablet  TAKE ONE-HALF TABLET (5 MG TOTAL) BY MOUTH AT BEDTIME. (Patient taking differently: Take 5 mg by mouth at bedtime and may repeat dose one time if needed. )  . tiZANidine (ZANAFLEX) 2 MG tablet Take 1 tablet (2 mg total) by mouth every 8 (eight) hours as needed for muscle spasms.  . traMADol (ULTRAM) 50 MG tablet Take 1 tablet (50 mg total) by mouth 2 (two) times daily as needed.  . vitamin B-12 (CYANOCOBALAMIN) 1000 MCG tablet Take 1,000 mcg by mouth daily.  . [DISCONTINUED] traMADol (ULTRAM) 50 MG tablet Take 50 mg by mouth 2 (two) times daily.   . [DISCONTINUED] traZODone (DESYREL) 100 MG tablet TAKE 1 TABLET BY MOUTH EVERYDAY AT BEDTIME (Patient taking differently: Take 100 mg by mouth at bedtime. )   No facility-administered encounter medications on file as of 02/25/2020.    Review of Systems  Constitutional: Negative for appetite change and unexpected weight change.       Weight is stable.   HENT: Negative for congestion and sinus pressure.   Respiratory: Negative for cough, chest tightness and shortness of breath.   Cardiovascular: Negative for chest pain, palpitations and leg swelling.  Gastrointestinal: Negative for abdominal pain, diarrhea, nausea and vomiting.       Acid reflux as outlined.    Genitourinary: Negative for difficulty urinating and dysuria.  Musculoskeletal: Positive for back pain. Negative for joint swelling and myalgias.       Leg pain as outlined.   Skin: Negative for color change and rash.    Neurological: Negative for dizziness, light-headedness and headaches.  Psychiatric/Behavioral: Negative for agitation and dysphoric mood.       Objective:    Physical Exam Vitals reviewed.  Constitutional:      General: She is not in acute distress.    Appearance: Normal appearance.  HENT:     Head: Normocephalic and atraumatic.     Right Ear: External ear normal.     Left Ear: External ear normal.  Eyes:     General: No scleral icterus.       Right eye: No discharge.        Left eye: No discharge.     Conjunctiva/sclera: Conjunctivae normal.  Neck:     Thyroid: No thyromegaly.  Cardiovascular:     Rate and Rhythm: Normal rate and regular rhythm.  Pulmonary:     Effort: No respiratory distress.     Breath sounds: Normal breath sounds. No wheezing.  Abdominal:     General: Bowel sounds are normal.     Palpations: Abdomen is soft.     Tenderness: There is no abdominal tenderness.  Musculoskeletal:        General: No swelling or tenderness.     Cervical back: Neck supple. No tenderness.  Lymphadenopathy:     Cervical: No cervical adenopathy.  Skin:    Findings: No erythema or rash.  Neurological:     Mental Status: She is alert.  Psychiatric:        Mood and Affect: Mood normal.        Behavior: Behavior normal.     BP 124/72   Pulse 69   Temp (!) 96.7 F (35.9 C)   Resp 16   Ht '5\' 5"'$  (1.651 m)   Wt 177 lb (80.3 kg)   SpO2 98%   BMI 29.45 kg/m  Wt Readings from Last 3 Encounters:  02/25/20 177 lb (80.3 kg)  01/06/20 177 lb (80.3 kg)  12/31/19 177 lb  9.6 oz (80.6 kg)     Lab Results  Component Value Date   WBC 4.4 02/17/2020   HGB 12.2 02/17/2020   HCT 36.8 02/17/2020   PLT 188.0 02/17/2020   GLUCOSE 112 (H) 12/29/2019   CHOL 178 12/29/2019   TRIG 80.0 12/29/2019   HDL 68.70 12/29/2019   LDLDIRECT 146.5 09/19/2012   LDLCALC 93 12/29/2019   ALT 12 12/29/2019   AST 15 12/29/2019   NA 141 12/29/2019   K 4.1 12/29/2019   CL 107 12/29/2019    CREATININE 0.83 12/29/2019   BUN 25 (H) 12/29/2019   CO2 29 12/29/2019   TSH 2.16 12/29/2019   INR 1.0 08/21/2019   HGBA1C 6.4 12/29/2019   MICROALBUR 0.2 02/13/2019       Assessment & Plan:   Problem List Items Addressed This Visit    Aortic atherosclerosis ([place])    On simvastatin.        Back pain    S/p lumbar decompression and extension of her prior fusion 08/2018.  Has been followed by Dr Ellene Route. S/p hip surgery.  Had referred to Dr Sharlet Salina.  Per pt, "too much hardware" - recommended f/u with pain clinic.  Referred to pain clinic.  She is waiting for appt.        Relevant Medications   traMADol (ULTRAM) 50 MG tablet   Diabetes mellitus type 2, diet-controlled (HCC)    Sugars as outlined.  Follow met b and a1c.  Continue metformin.   Lab Results  Component Value Date   HGBA1C 6.4 12/29/2019        Relevant Orders   Hemoglobin I4P   Basic metabolic panel   Microalbumin / creatinine urine ratio   Essential (primary) hypertension    Blood pressure as outlined.  Continue losartan/hctz and metoprolol.  Follow pressures.  Follow metabolic panel.        Relevant Orders   TSH   GERD (gastroesophageal reflux disease)    Increased acid reflux as outlined.  Increase protonix to bid.  Follow.        Hypercholesterolemia    On simvastatin.  Follow lipid panel and liver function tests.        Relevant Orders   Hepatic function panel   Lipid panel   Mild depression ([place])    On zoloft.  Rarely uses xanax.  Had discussed psych referral.  She feels she is doing better.  Wants to hold on referral.  Follow.        Weight loss    Previous weight loss as outlined.  Discussed with her today.  Weight stable.  Discussed po intake.  Follow.         Other Visit Diagnoses    Anemia, unspecified type    -  Primary   Relevant Orders   CBC with Differential/Platelet   IBC + Ferritin       [name], MD

## 2020-03-01 DIAGNOSIS — J301 Allergic rhinitis due to pollen: Secondary | ICD-10-CM | POA: Diagnosis not present

## 2020-03-04 ENCOUNTER — Other Ambulatory Visit: Payer: Self-pay | Admitting: Internal Medicine

## 2020-03-05 ENCOUNTER — Encounter: Payer: Self-pay | Admitting: Internal Medicine

## 2020-03-05 NOTE — Assessment & Plan Note (Signed)
On zoloft.  Rarely uses xanax.  Had discussed psych referral.  She feels she is doing better.  Wants to hold on referral.  Follow.

## 2020-03-05 NOTE — Assessment & Plan Note (Signed)
Previous weight loss as outlined.  Discussed with her today.  Weight stable.  Discussed po intake.  Follow.

## 2020-03-05 NOTE — Assessment & Plan Note (Signed)
On simvastatin.   

## 2020-03-05 NOTE — Assessment & Plan Note (Signed)
Blood pressure as outlined.  Continue losartan/hctz and metoprolol.  Follow pressures.  Follow metabolic panel.

## 2020-03-05 NOTE — Assessment & Plan Note (Signed)
On simvastatin.  Follow lipid panel and liver function tests.   

## 2020-03-05 NOTE — Assessment & Plan Note (Signed)
Increased acid reflux as outlined.  Increase protonix to bid.  Follow.

## 2020-03-05 NOTE — Assessment & Plan Note (Addendum)
Sugars as outlined.  Follow met b and a1c.  Continue metformin.   Lab Results  Component Value Date   HGBA1C 6.4 12/29/2019

## 2020-03-05 NOTE — Assessment & Plan Note (Signed)
S/p lumbar decompression and extension of her prior fusion 08/2018.  Has been followed by Dr Ellene Route. S/p hip surgery.  Had referred to Dr Sharlet Salina.  Per pt, "too much hardware" - recommended f/u with pain clinic.  Referred to pain clinic.  She is waiting for appt.

## 2020-03-08 DIAGNOSIS — J301 Allergic rhinitis due to pollen: Secondary | ICD-10-CM | POA: Diagnosis not present

## 2020-03-10 ENCOUNTER — Other Ambulatory Visit: Payer: Self-pay | Admitting: Internal Medicine

## 2020-03-15 DIAGNOSIS — J301 Allergic rhinitis due to pollen: Secondary | ICD-10-CM | POA: Diagnosis not present

## 2020-03-21 ENCOUNTER — Encounter: Payer: Self-pay | Admitting: Internal Medicine

## 2020-03-21 DIAGNOSIS — R69 Illness, unspecified: Secondary | ICD-10-CM | POA: Diagnosis not present

## 2020-03-22 DIAGNOSIS — J301 Allergic rhinitis due to pollen: Secondary | ICD-10-CM | POA: Diagnosis not present

## 2020-03-22 NOTE — Telephone Encounter (Signed)
Need to confirm how she is taking the tizanidine.

## 2020-03-25 ENCOUNTER — Telehealth: Payer: Self-pay | Admitting: Internal Medicine

## 2020-03-25 MED ORDER — TIZANIDINE HCL 2 MG PO TABS: 2.0000 mg | ORAL_TABLET | Freq: Two times a day (BID) | ORAL | 0 refills | Status: DC | PRN

## 2020-03-25 NOTE — Telephone Encounter (Signed)
rx sent in for tizanidine.

## 2020-03-25 NOTE — Telephone Encounter (Signed)
Patient called, she takes the medication one in morning and I before bed time. Her appointment with pain management was rescheduled out to June, doctor was called to jury duty.She has 3 pill remaining.

## 2020-03-28 ENCOUNTER — Other Ambulatory Visit: Payer: Self-pay

## 2020-03-28 MED ORDER — TIZANIDINE HCL 2 MG PO TABS: 2.0000 mg | ORAL_TABLET | Freq: Two times a day (BID) | ORAL | 0 refills | Status: DC | PRN

## 2020-03-29 ENCOUNTER — Ambulatory Visit: Payer: Medicare HMO | Admitting: Pain Medicine

## 2020-03-29 DIAGNOSIS — J301 Allergic rhinitis due to pollen: Secondary | ICD-10-CM | POA: Diagnosis not present

## 2020-04-05 DIAGNOSIS — M19042 Primary osteoarthritis, left hand: Secondary | ICD-10-CM | POA: Diagnosis not present

## 2020-04-05 DIAGNOSIS — J301 Allergic rhinitis due to pollen: Secondary | ICD-10-CM | POA: Diagnosis not present

## 2020-04-10 ENCOUNTER — Other Ambulatory Visit: Payer: Self-pay | Admitting: Internal Medicine

## 2020-04-12 DIAGNOSIS — J301 Allergic rhinitis due to pollen: Secondary | ICD-10-CM | POA: Diagnosis not present

## 2020-04-19 ENCOUNTER — Other Ambulatory Visit: Payer: Self-pay

## 2020-04-19 ENCOUNTER — Other Ambulatory Visit (INDEPENDENT_AMBULATORY_CARE_PROVIDER_SITE_OTHER): Payer: Medicare HMO

## 2020-04-19 DIAGNOSIS — E119 Type 2 diabetes mellitus without complications: Secondary | ICD-10-CM | POA: Diagnosis not present

## 2020-04-19 DIAGNOSIS — J301 Allergic rhinitis due to pollen: Secondary | ICD-10-CM | POA: Diagnosis not present

## 2020-04-19 DIAGNOSIS — D649 Anemia, unspecified: Secondary | ICD-10-CM | POA: Diagnosis not present

## 2020-04-19 DIAGNOSIS — I10 Essential (primary) hypertension: Secondary | ICD-10-CM

## 2020-04-19 DIAGNOSIS — E7800 Pure hypercholesterolemia, unspecified: Secondary | ICD-10-CM

## 2020-04-19 LAB — CBC WITH DIFFERENTIAL/PLATELET
Basophils Absolute: 0 10*3/uL (ref 0.0–0.1)
Basophils Relative: 1 % (ref 0.0–3.0)
Eosinophils Absolute: 0.2 10*3/uL (ref 0.0–0.7)
Eosinophils Relative: 3.6 % (ref 0.0–5.0)
HCT: 36.9 % (ref 36.0–46.0)
Hemoglobin: 12.2 g/dL (ref 12.0–15.0)
Lymphocytes Relative: 31.8 % (ref 12.0–46.0)
Lymphs Abs: 1.6 10*3/uL (ref 0.7–4.0)
MCHC: 33.2 g/dL (ref 30.0–36.0)
MCV: 92.5 fl (ref 78.0–100.0)
Monocytes Absolute: 0.5 10*3/uL (ref 0.1–1.0)
Monocytes Relative: 10.8 % (ref 3.0–12.0)
Neutro Abs: 2.6 10*3/uL (ref 1.4–7.7)
Neutrophils Relative %: 52.8 % (ref 43.0–77.0)
Platelets: 179 10*3/uL (ref 150.0–400.0)
RBC: 3.99 Mil/uL (ref 3.87–5.11)
RDW: 14.3 % (ref 11.5–15.5)
WBC: 4.9 10*3/uL (ref 4.0–10.5)

## 2020-04-19 LAB — HEPATIC FUNCTION PANEL
ALT: 10 U/L (ref 0–35)
AST: 17 U/L (ref 0–37)
Albumin: 4.2 g/dL (ref 3.5–5.2)
Alkaline Phosphatase: 68 U/L (ref 39–117)
Bilirubin, Direct: 0.1 mg/dL (ref 0.0–0.3)
Total Bilirubin: 0.6 mg/dL (ref 0.2–1.2)
Total Protein: 6.7 g/dL (ref 6.0–8.3)

## 2020-04-19 LAB — BASIC METABOLIC PANEL WITH GFR
BUN: 25 mg/dL — ABNORMAL HIGH (ref 6–23)
CO2: 29 meq/L (ref 19–32)
Calcium: 10 mg/dL (ref 8.4–10.5)
Chloride: 105 meq/L (ref 96–112)
Creatinine, Ser: 0.84 mg/dL (ref 0.40–1.20)
GFR: 66.11 mL/min
Glucose, Bld: 106 mg/dL — ABNORMAL HIGH (ref 70–99)
Potassium: 4 meq/L (ref 3.5–5.1)
Sodium: 140 meq/L (ref 135–145)

## 2020-04-19 LAB — MICROALBUMIN / CREATININE URINE RATIO
Creatinine,U: 150.8 mg/dL
Microalb Creat Ratio: 0.5 mg/g (ref 0.0–30.0)
Microalb, Ur: 0.8 mg/dL (ref 0.0–1.9)

## 2020-04-19 LAB — IBC + FERRITIN
Ferritin: 18 ng/mL (ref 10.0–291.0)
Iron: 71 ug/dL (ref 42–145)
Saturation Ratios: 18.7 % — ABNORMAL LOW (ref 20.0–50.0)
Transferrin: 271 mg/dL (ref 212.0–360.0)

## 2020-04-19 LAB — LIPID PANEL
Cholesterol: 160 mg/dL (ref 0–200)
HDL: 65.2 mg/dL
LDL Cholesterol: 80 mg/dL (ref 0–99)
NonHDL: 94.56
Total CHOL/HDL Ratio: 2
Triglycerides: 74 mg/dL (ref 0.0–149.0)
VLDL: 14.8 mg/dL (ref 0.0–40.0)

## 2020-04-19 LAB — TSH: TSH: 2.24 u[IU]/mL (ref 0.35–4.50)

## 2020-04-19 LAB — HEMOGLOBIN A1C: Hgb A1c MFr Bld: 5.9 % (ref 4.6–6.5)

## 2020-04-20 ENCOUNTER — Ambulatory Visit
Admission: RE | Admit: 2020-04-20 | Discharge: 2020-04-20 | Disposition: A | Discharge status: Home / Self Care | Payer: Medicare HMO | Source: Ambulatory Visit | Attending: Pain Medicine | Admitting: Pain Medicine

## 2020-04-20 ENCOUNTER — Encounter: Payer: Self-pay | Admitting: Pain Medicine

## 2020-04-20 ENCOUNTER — Ambulatory Visit: Payer: Medicare HMO | Admitting: Pain Medicine

## 2020-04-20 ENCOUNTER — Other Ambulatory Visit: Payer: Self-pay | Admitting: Internal Medicine

## 2020-04-20 ENCOUNTER — Other Ambulatory Visit: Payer: Self-pay

## 2020-04-20 ENCOUNTER — Other Ambulatory Visit
Admission: RE | Admit: 2020-04-20 | Discharge: 2020-04-20 | Disposition: A | Discharge status: Home / Self Care | Payer: Medicare HMO | Source: Ambulatory Visit | Attending: Pain Medicine | Admitting: Pain Medicine

## 2020-04-20 VITALS — BP 156/85 | HR 70 | Temp 98.7°F | Resp 16 | Ht 65.0 in | Wt 175.0 lb

## 2020-04-20 DIAGNOSIS — M546 Pain in thoracic spine: Secondary | ICD-10-CM

## 2020-04-20 DIAGNOSIS — Z79899 Other long term (current) drug therapy: Secondary | ICD-10-CM

## 2020-04-20 DIAGNOSIS — M961 Postlaminectomy syndrome, not elsewhere classified: Secondary | ICD-10-CM

## 2020-04-20 DIAGNOSIS — M899 Disorder of bone, unspecified: Secondary | ICD-10-CM | POA: Insufficient documentation

## 2020-04-20 DIAGNOSIS — G8929 Other chronic pain: Secondary | ICD-10-CM

## 2020-04-20 DIAGNOSIS — M545 Low back pain, unspecified: Secondary | ICD-10-CM

## 2020-04-20 DIAGNOSIS — M62830 Muscle spasm of back: Secondary | ICD-10-CM | POA: Insufficient documentation

## 2020-04-20 DIAGNOSIS — M7918 Myalgia, other site: Secondary | ICD-10-CM | POA: Insufficient documentation

## 2020-04-20 DIAGNOSIS — Z789 Other specified health status: Secondary | ICD-10-CM | POA: Insufficient documentation

## 2020-04-20 DIAGNOSIS — G894 Chronic pain syndrome: Secondary | ICD-10-CM | POA: Insufficient documentation

## 2020-04-20 LAB — VITAMIN B12: Vitamin B-12: 1485 pg/mL — ABNORMAL HIGH (ref 180–914)

## 2020-04-20 LAB — COMPREHENSIVE METABOLIC PANEL WITH GFR
ALT: 14 U/L (ref 0–44)
AST: 21 U/L (ref 15–41)
Albumin: 4.8 g/dL (ref 3.5–5.0)
Alkaline Phosphatase: 71 U/L (ref 38–126)
Anion gap: 12 (ref 5–15)
BUN: 19 mg/dL (ref 8–23)
CO2: 26 mmol/L (ref 22–32)
Calcium: 10.1 mg/dL (ref 8.9–10.3)
Chloride: 103 mmol/L (ref 98–111)
Creatinine, Ser: 0.88 mg/dL (ref 0.44–1.00)
GFR calc Af Amer: >60 mL/min
GFR calc non Af Amer: >60 mL/min
Glucose, Bld: 116 mg/dL — ABNORMAL HIGH (ref 70–99)
Potassium: 3.4 mmol/L — ABNORMAL LOW (ref 3.5–5.1)
Sodium: 141 mmol/L (ref 135–145)
Total Bilirubin: 0.9 mg/dL (ref 0.3–1.2)
Total Protein: 7.6 g/dL (ref 6.5–8.1)

## 2020-04-20 LAB — C-REACTIVE PROTEIN: CRP: 0.5 mg/dL

## 2020-04-20 LAB — SEDIMENTATION RATE: Sed Rate: 11 mm/h (ref 0–30)

## 2020-04-20 LAB — MAGNESIUM: Magnesium: 1.9 mg/dL (ref 1.7–2.4)

## 2020-04-20 LAB — VITAMIN D 25 HYDROXY (VIT D DEFICIENCY, FRACTURES): Vit D, 25-Hydroxy: 54.53 ng/mL (ref 30–100)

## 2020-04-20 NOTE — Patient Instructions (Signed)
____________________________________________________________________________________________  Muscle Spasms & Cramps  Cause:  The most common cause of muscle spasms and cramps is vitamin and/or electrolyte (calcium, potassium, sodium, etc.) deficiencies.  Possible triggers: Sweating - causes loss of electrolytes thru the skin. Steroids - causes loss of electrolytes thru the urine.  Treatment: 1. Gatorade (or any other electrolyte-replenishing drink) - Take 1, 8 oz glass with each meal (3 times a day). 2. OTC (over-the-counter) Magnesium 400 to 500 mg - Take 1 tablet twice a day (one with breakfast and one before bedtime). If you have kidney problems, talk to your primary care physician before taking any Magnesium. 3. Tonic Water with quinine - Take 1, 8 oz glass before bedtime.   ____________________________________________________________________________________________    

## 2020-04-20 NOTE — Progress Notes (Signed)
Patient: Erin Camacho  Service Category: E/M  Provider: Gaspar Cola, MD  DOB: June 18, 1946  DOS: 04/20/2020  Referring Provider: [name], MD  MRN: [id number]  Setting: Ambulatory outpatient  PCP: [name], MD  Type: New Patient  Specialty: Interventional Pain Management    Location: Office  Delivery: Face-to-face     Primary Reason(s) for Visit: Encounter for initial evaluation of one or more chronic problems (new to examiner) potentially causing chronic pain, and posing a threat to normal musculoskeletal function. (Level of risk: High) CC: Back Pain  HPI  Ms. Haney is a 74 y.o. year old, female patient, who comes today to see [place] for the first time for an initial evaluation of her chronic pain. She has Chronic low back pain (1ry area of Pain) (Bilateral) (R>L) w/o sciatica; Hypertension; Diabetes mellitus type 2, diet-controlled ([place]); Insomnia; Obesity (BMI 30-39.9); Hypercholesterolemia; Healthcare maintenance; Status post lumbar spine operation; Mild depression ([place]); Failed back surgical syndrome (x3); Skin lesion; History of bariatric surgery; Bilateral shoulder pain; Right leg pain; Neuritis or radiculitis due to rupture of lumbar intervertebral disc; Rhinitis, allergic; Spondylolisthesis at L5-S1 level; Atrophic vaginitis; Essential (primary) hypertension; Hyperlipidemia; Major depressive disorder, single episode; GERD (gastroesophageal reflux disease); Aortic atherosclerosis ([place]); Cough; Shortness of breath; Spinal stenosis; Lumbar stenosis with neurogenic claudication; Early satiety; H/O total hip arthroplasty; Weight loss; Other abnormal glucose; Status post left hip replacement; Status post bariatric surgery; Chronic pain syndrome; Pharmacologic therapy; Disorder of skeletal system; Problems influencing health status; Chronic thoracic spine pain (Bilateral) (R>L); Myofascial pain syndrome of thoracic spine; Chronic musculoskeletal pain; and Spasm of thoracic back muscle on  their problem list. Today she comes in for evaluation of her Back Pain  Pain Assessment: Location: Right Back Onset: More than a month ago Duration: Chronic pain Quality: Spasm, Aching, Throbbing, Sharp, Contraction Severity: 4 /10 (subjective, self-reported pain score)  Note: Reported level is compatible with observation.                         When using our objective Pain Scale, levels between 6 and 10/10 are said to belong in an emergency room, as it progressively worsens from a 6/10, described as severely limiting, requiring emergency care not usually available at an outpatient pain management facility. At a 6/10 level, communication becomes difficult and requires great effort. Assistance to reach the emergency department may be required. Facial flushing and profuse sweating along with potentially dangerous increases in heart rate and blood pressure will be evident. Effect on ADL: She has to go to bed when the pain gets so bad. Timing: Intermittent Modifying factors: hot showers, tramadol BP: (!) 156/85  HR: 70  Onset and Duration: Date of onset: 08/2018 Cause of pain: no response given Severity: Getting better and Getting worse Timing: Not influenced by the time of the day and After activity or exercise Aggravating Factors: Bending, Prolonged standing, Twisting, Walking and Walking uphill Alleviating Factors: Medications and Warm showers or baths Associated Problems: Spasms and Pain that does not allow patient to sleep Quality of Pain: Aching, Nagging, Sharp and Stabbing Previous Examinations or Tests: CT scan, MRI scan, X-rays and Neurosurgical evaluation Previous Treatments: Epidural steroid injections and Narcotic medications  The patient comes into the clinics today for the first time for a chronic pain management evaluation.  According to the patient the primary area of pain is that of the lower back, bilaterally, with the right side being worse than the left.  She  describes  having mid back spasms with pain and numbness in the distribution of the L2 nerve root, bilaterally.  The patient also has a history of bilateral total hip replacement with the right side done in 2013 and the left side done in 2020.  In terms of the patient's lower back she indicates having had 3 prior back surgeries by Dr. Kristeen Miss with the first 1 having been in 2014.  Prior to that surgery she was experiencing back pain that was worse than lower extremity pain in the lower extremity pain was in the right side.  After the surgery which apparently involved the L2-L4 levels, she remained with some residual right lower extremity problems which she was told may be secondary to permanent nerve injury.  Her second lumbar surgery was done in 2017 at the L5-S1 level.  At that point she was experiencing significant right lower extremity pain and low back pain but this time the right lower extremity pain predominated.  The patient indicated the pain was again in the same area where she had it before.  The second and third surgery were also done by Dr. Ellene Route.  [name] third back surgery was done in 2019 but after 1 week she began to experience excruciating low back pain.  She indicated that this particular surgery involved placing rods from T10 down to L1.  Currently she describes they pain as a muscle cramping sensation.  She indicated having had an epidural steroid injection done by Dr. Kristeen Miss, but after the injection actually the spasms got worse and the pain did not improve.  Today I took the time to provide the patient with information regarding my pain practice. The patient was informed that my practice is divided into two sections: an interventional pain management section, as well as a completely separate and distinct medication management section. I explained that I have procedure days for my interventional therapies, and evaluation days for follow-ups and medication management. Because of the  amount of documentation required during both, they are kept separated. This means that there is the possibility that she may be scheduled for a procedure on one day, and medication management the next. I have also informed her that because of staffing and facility limitations, I no longer take patients for medication management only. To illustrate the reasons for this, I gave the patient the example of surgeons, and how inappropriate it would be to refer a patient to his/her care, just to write for the post-surgical antibiotics on a surgery done by a different surgeon.   Because interventional pain management is my board-certified specialty, the patient was informed that joining my practice means that they are open to any and all interventional therapies. I made it clear that this does not mean that they will be forced to have any procedures done. What this means is that I believe interventional therapies to be essential part of the diagnosis and proper management of chronic pain conditions. Therefore, patients not interested in these interventional alternatives will be better served under the care of a different practitioner.  The patient was also made aware of my Comprehensive Pain Management Safety Guidelines where by joining my practice, they limit all of their nerve blocks and joint injections to those done by our practice, for as long as we are retained to manage their care.   Historic Controlled Substance Pharmacotherapy Review  PMP and historical list of controlled substances: Alprazolam 0.25 mg; coating/guaifenesin 10/100; oxycodone/APAP 5/325; hydromorphone 2 mg; oxycodone IR  5 mg; tramadol 50 mg Highest opioid analgesic regimen found: Oxycodone/APAP 5/325 2 tablets p.o. every 6 hours (40 mg/day of oxycodone) (64.29 MME's) Most recent opioid analgesic: Tramadol 50 mg 1 tablet p.o. twice daily (100 mg/day of tramadol) (10 MME) last prescribed on 02/25/2020) Current opioid analgesics:  None Highest  recorded MME/day: 64.29 mg/day MME/day: 0 mg/day  Medications: The patient did not bring the medication(s) to the appointment, as requested in our "New Patient Package" Pharmacodynamics: Desired effects: Analgesia: The patient reports >50% benefit. Reported improvement in function: The patient reports medication allows her to accomplish basic ADLs. Clinically meaningful improvement in function (CMIF): Sustained CMIF goals met Perceived effectiveness: Described as relatively effective, allowing for increase in activities of daily living (ADL) Undesirable effects: Side-effects or Adverse reactions: None reported Historical Monitoring: The patient  reports no history of drug use. List of all UDS Test(s): No results found for: MDMA, COCAINSCRNUR, [place], [place], CANNABQUANT, THCU, [place] List of other Serum/Urine Drug Screening Test(s):  No results found for: AMPHSCRSER, BARBSCRSER, BENZOSCRSER, COCAINSCRSER, COCAINSCRNUR, PCPSCRSER, PCPQUANT, THCSCRSER, THCU, CANNABQUANT, OPIATESCRSER, OXYSCRSER, PROPOXSCRSER, ETH Historical Background Evaluation: [place] PMP: PDMP reviewed during this encounter. Six (6) year initial data search conducted.             PMP NARX Score Report:  Narcotic: 310 Sedative: 180 Stimulant: 000 [place] Department of public safety, offender search: Editor, commissioning Information) Non-contributory Risk Assessment Profile: Aberrant behavior: None observed or detected today Risk factors for fatal opioid overdose: None identified today PMP NARX Overdose Risk Score: 200 Fatal overdose hazard ratio (HR): Calculation deferred Non-fatal overdose hazard ratio (HR): Calculation deferred Risk of opioid abuse or dependence: 0.7-3.0% with doses ? 36 MME/day and 6.1-26% with doses ? 120 MME/day. Substance use disorder (SUD) risk level: See below Personal History of Substance Abuse (SUD-Substance use disorder):  Alcohol: Negative  Illegal Drugs: Negative  Rx Drugs: Negative  ORT Risk Level  calculation: Low Risk Opioid Risk Tool - 04/20/20 1157      Family History of Substance Abuse   Alcohol  Negative    Illegal Drugs  Negative    Rx Drugs  Negative      Personal History of Substance Abuse   Alcohol  Negative    Illegal Drugs  Negative    Rx Drugs  Negative      Age   Age between 23-45 years   No      History of Preadolescent Sexual Abuse   History of Preadolescent Sexual Abuse  Negative or Female      Psychological Disease   Psychological Disease  Negative    Depression  Negative      Total Score   Opioid Risk Tool Scoring  0    Opioid Risk Interpretation  Low Risk      ORT Scoring interpretation table:  Score <3 = Low Risk for SUD  Score between 4-7 = Moderate Risk for SUD  Score >8 = High Risk for Opioid Abuse   PHQ-2 Depression Scale:  Total score: 0  PHQ-2 Scoring interpretation table: (Score and probability of major depressive disorder)  Score 0 = No depression  Score 1 = 15.4% Probability  Score 2 = 21.1% Probability  Score 3 = 38.4% Probability  Score 4 = 45.5% Probability  Score 5 = 56.4% Probability  Score 6 = 78.6% Probability   PHQ-9 Depression Scale:  Total score: 0  PHQ-9 Scoring interpretation table:  Score 0-4 = No depression  Score 5-9 = Mild depression  Score  10-14 = Moderate depression  Score 15-19 = Moderately severe depression  Score 20-27 = Severe depression (2.4 times higher risk of SUD and 2.89 times higher risk of overuse)   Pharmacologic Plan: As per protocol, I have not taken over any controlled substance management, pending the results of ordered tests and/or consults.            Initial impression: Pending review of available data and ordered tests.  Meds   Current Outpatient Medications:  .  Cholecalciferol (VITAMIN D3) 2000 units TABS, Take 2,000 Units by mouth daily., Disp: , Rfl:  .  fexofenadine (ALLEGRA) 180 MG tablet, Take 1 tablet (180 mg total) by mouth daily., Disp: 90 tablet, Rfl: 4 .  fluticasone  (FLONASE) 50 MCG/ACT nasal spray, SPRAY 2 SPRAYS INTO EACH NOSTRIL EVERY DAY (Patient taking differently: Place 2 sprays into both nostrils at bedtime. ), Disp: 48 mL, Rfl: 1 .  losartan-hydrochlorothiazide (HYZAAR) 50-12.5 MG tablet, TAKE 1 TABLET BY MOUTH EVERY DAY (Patient taking differently: Take 1 tablet by mouth daily. ), Disp: 90 tablet, Rfl: 2 .  metFORMIN (GLUCOPHAGE) 500 MG tablet, TAKE 1 TABLET BY MOUTH EVERY DAY, Disp: 90 tablet, Rfl: 1 .  metoprolol succinate (TOPROL-XL) 25 MG 24 hr tablet, TAKE 1/2 TABLET BY MOUTH EVERY DAY, Disp: 45 tablet, Rfl: 1 .  Multiple Vitamin (MULTIVITAMIN) tablet, Take 1 tablet by mouth daily., Disp: , Rfl:  .  ONETOUCH VERIO test strip, USE TO CHECK BLOOD SUGAR TWICE A DAY, Disp: 100 strip, Rfl: 12 .  pantoprazole (PROTONIX) 40 MG tablet, TAKE 1 TABLET (40 MG TOTAL) BY MOUTH 2 (TWO) TIMES DAILY BEFORE A MEAL., Disp: 180 tablet, Rfl: 3 .  sertraline (ZOLOFT) 100 MG tablet, TAKE 1/2 TABLET BY MOUTH (50 MG) BY MOUTH DAILY, Disp: 45 tablet, Rfl: 1 .  simvastatin (ZOCOR) 10 MG tablet, TAKE 1/2 TABLET (5 MG TOTAL) BY MOUTH AT BEDTIME., Disp: 45 tablet, Rfl: 3 .  traMADol (ULTRAM) 50 MG tablet, Take 1 tablet (50 mg total) by mouth 2 (two) times daily as needed., Disp: 30 tablet, Rfl: 0 .  traZODone (DESYREL) 100 MG tablet, TAKE 1 TABLET BY MOUTH EVERYDAY AT BEDTIME, Disp: 90 tablet, Rfl: 1 .  vitamin B-12 (CYANOCOBALAMIN) 1000 MCG tablet, Take 1,000 mcg by mouth daily., Disp: , Rfl:  .  tiZANidine (ZANAFLEX) 2 MG tablet, TAKE 1 TABLET (2 MG TOTAL) BY MOUTH 2 (TWO) TIMES DAILY AS NEEDED FOR MUSCLE SPASMS., Disp: 60 tablet, Rfl: 0  Imaging Review  Thoracic Imaging: Thoracic CT wo contrast:  Results for orders placed during the hospital encounter of 08/06/18  CT THORACIC SPINE WO CONTRAST   Narrative CLINICAL DATA:  Spinal stenosis, lumbar region, with neurogenic claudication  EXAM: CT THORACIC SPINE WITHOUT CONTRAST; CT LUMBAR SPINE WITHOUT  CONTRAST  TECHNIQUE: Multidetector CT images of the thoracic were obtained using the standard protocol without intravenous contrast. Levels of coverage are from the T3-4 interspace through the mid sacrum.  COMPARISON:  Lumbar spine MRI 07/20/2018  FINDINGS: Alignment: Mild L1-2 retrolisthesis.  Fused L5-S1 anterolisthesis.  Vertebrae: L2-S1 PLIF with solid arthrodesis. No hardware complication.  No acute fracture.  No discitis or aggressive bone lesion  Paraspinal and other soft tissues: Lap band at the GE junction.  Disc levels: L1-2 severe adjacent segment degeneration with vacuum phenomenon within the disc space that shows endplate sclerosis and irregularity. Extruded calcified disc severely narrows the left L1-2 foramen with tracking superior to the left L2 transverse process in the paravertebral space.  Calcified herniation is also seen extending inferiorly from the right subarticular recess into the right L2-3 foramen.  Spondylosis and disc narrowing in the thoracic spine. There is a sizable herniation at T7-8 with presumed cord flattening. A smaller herniation with cord contact is present at T8-9. There is a right paracentral to foraminal protrusion at T9-10 without suspected impingement.  Dorsal epidural gas at T12 and L1. Epidural gas at the right L2-3 foramen. An ESI was performed at this level July 2019. Question more recent ESI.  IMPRESSION: 1. L1-2 severe adjacent segment degeneration with canal and foraminal compression. Calcified disc extrusion fills the left L1-2 foramen and extends along the L2 left transverse process. Right paracentral inferiorly migrating calcified disc extrusion effaces the right subarticular recess and tracks out of the right L2-3 foramen. 2. T7-8 sizable disc protrusion with presumed cord flattening. There is a T8-9 herniation which contacts the cord. 3. L2-S1 PLIF with solid arthrodesis. 4. Epidural gas presumably from epidural  injection.   Electronically Signed   By: Monte Fantasia M.D.   On: 08/07/2018 09:10    Thoracic DG:  Results for orders placed during the hospital encounter of 09/15/18  [facility] THORACOLUMABAR SPINE   Narrative CLINICAL DATA:  Postoperative pain.  EXAM: THORACOLUMBAR SPINE 1V  COMPARISON:  09/15/2018 CT 08/06/2018.  FINDINGS: Multilevel thoracolumbar spine fusion. Hardware intact. Stable anterolisthesis L5 on S1. No acute bony abnormality. No evidence of fracture. Surgical clips right upper quadrant.  IMPRESSION: Multilevel thoracolumbar spine fusion. Hardware intact. Stable anterolisthesis L5 on S1. No acute bony abnormality.   Electronically Signed   By: Marcello Moores  Register   On: 09/18/2018 13:33    Lumbosacral Imaging: Lumbar MR wo contrast:  Results for orders placed during the hospital encounter of 09/30/18  MR LUMBAR SPINE WO CONTRAST   Narrative CLINICAL DATA:  Acute onset low back pain. Status post lumbar spine surgery September 15, 2018. Bowel and bladder incontinence, suspect cauda equina syndrome.  EXAM: MRI LUMBAR SPINE WITHOUT CONTRAST  TECHNIQUE: Multiplanar, multisequence MR imaging of the lumbar spine was performed. No intravenous contrast was administered.  COMPARISON:  Lumbar spine radiographs July 31, 2018 and MRI lumbar spine July 20, 2018  FINDINGS: SEGMENTATION: For the purposes of this report, the last well-formed intervertebral disc is reported as L5-S1.  ALIGNMENT: Maintained lumbar lordosis. Stable grade 1 L5-S1 anterolisthesis.  VERTEBRAE:Vertebral bodies are intact. New L1-2 disc prosthesis with cephalad extension of spinal fusion, status post thoracolumbar PLIF, revised at L1. Pedicle screws result in susceptibility artifact. Bridging bone marrow signal L2-3 through L5-S1 compatible with arthrodesis. Moderate T11-12 disc height loss similar to prior examination with mild desiccation of the nonsurgically altered thoracic  discs. No suspicious or acute bone marrow signal. Central low signal within T11 through L1 consistent with vertebral body cement augmentation.  CONUS MEDULLARIS AND CAUDA EQUINA: Conus medullaris terminates at L1-2, no abnormal signal though hardware artifact limits assessment. No nerve root clumping.  PARASPINAL AND OTHER SOFT TISSUES: Severe paraspinal muscle postoperative denervation. Ill-defined low T1, bright T2 2.9 x 4 cm fluid collection extending 12.4 cm from T11-L3 within the subcutaneous fat along surgical approach.  DISC LEVELS:  Axial images not obtained through thoracic spine. No canal stenosis. No high-grade neural foraminal narrowing the limited by hardware artifact.  L1-2: Disc prosthesis. Posterior decompression. No canal stenosis or neural foraminal narrowing, improved from prior examination.  L2-3, L3-4, L4-5: PLIF. Posterior decompression without canal stenosis or neural foraminal narrowing.  L5-S1: Anterolisthesis. PLIF. No canal stenosis. Similar  mild RIGHT neural foraminal narrowing.  IMPRESSION: 1. Thoracolumbar spinal fusion, revised at L1 with new thoracic component, status post included thoracic and L1 cement augmentation. Postoperative paraspinal seroma, unlikely to reflect abscess or pseudomeningocele. 2. No acute fracture.  Chronic grade 1 L5-S1 anterolisthesis. 3. No canal stenosis. Mild similar RIGHT L5-S1 neural foraminal narrowing.   Electronically Signed   By: Elon Alas M.D.   On: 09/30/2018 21:57    Lumbar MR w/wo contrast:  Results for orders placed during the hospital encounter of 07/20/18  MR Lumbar Spine W Wo Contrast   Narrative CLINICAL DATA:  Lumbar stenosis without neurogenic claudication. History of lumbar fusion.  EXAM: MRI LUMBAR SPINE WITHOUT AND WITH CONTRAST  TECHNIQUE: Multiplanar and multiecho pulse sequences of the lumbar spine were obtained without and with intravenous contrast.  CONTRAST:  64m  MULTIHANCE GADOBENATE DIMEGLUMINE 529 MG/ML IV SOLN  COMPARISON:  Lumbar MRI 10/11/2015  FINDINGS: Segmentation:  Normal  Alignment: 5 mm retrolisthesis L1-2 unchanged. 7 mm anterolisthesis L5-S1 has progressed.  Vertebrae: Negative for fracture or mass. Pedicle screw and interbody fusion L2 through S1  Conus medullaris and cauda equina: Conus extends to the L1-2 level. Conus and cauda equina appear normal.  Paraspinal and other soft tissues: Paraspinous muscle atrophy is advanced. No paraspinous soft tissue mass.  Disc levels:  T11-12: Mild disc bulging  T12-L1: Normal disc.  Mild facet degeneration without stenosis  L1-2: Advanced disc degeneration which has progressed in the interval. Diffuse endplate spurring has progressed. Advanced facet degeneration progression. Severe spinal stenosis and severe subarticular and foraminal stenosis bilaterally has progressed in the interval. Findings consistent with adjacent segment degeneration.  L2-3: Pedicle screw and interbody fusion without stenosis  L3-4: Pedicle screw and interbody fusion without stenosis  L4-5: Pedicle screw and interbody fusion without stenosis  L5-S1: 7 mm anterolisthesis. Interval pedicle screw and interbody fusion without stenosis.  IMPRESSION: PLIF L2 through S1 without stenosis  Progression of adjacent segment degeneration at L1-2 now with severe spinal stenosis and severe subarticular foraminal stenosis bilaterally.   Electronically Signed   By: CFranchot GalloM.D.   On: 07/20/2018 13:44    Lumbar CT wo contrast:  Results for orders placed during the hospital encounter of 08/06/18  CT LUMBAR SPINE WO CONTRAST   Narrative CLINICAL DATA:  Spinal stenosis, lumbar region, with neurogenic claudication  EXAM: CT THORACIC SPINE WITHOUT CONTRAST; CT LUMBAR SPINE WITHOUT CONTRAST  TECHNIQUE: Multidetector CT images of the thoracic were obtained using the standard protocol without  intravenous contrast. Levels of coverage are from the T3-4 interspace through the mid sacrum.  COMPARISON:  Lumbar spine MRI 07/20/2018  FINDINGS: Alignment: Mild L1-2 retrolisthesis.  Fused L5-S1 anterolisthesis.  Vertebrae: L2-S1 PLIF with solid arthrodesis. No hardware complication.  No acute fracture.  No discitis or aggressive bone lesion  Paraspinal and other soft tissues: Lap band at the GE junction.  Disc levels: L1-2 severe adjacent segment degeneration with vacuum phenomenon within the disc space that shows endplate sclerosis and irregularity. Extruded calcified disc severely narrows the left L1-2 foramen with tracking superior to the left L2 transverse process in the paravertebral space. Calcified herniation is also seen extending inferiorly from the right subarticular recess into the right L2-3 foramen.  Spondylosis and disc narrowing in the thoracic spine. There is a sizable herniation at T7-8 with presumed cord flattening. A smaller herniation with cord contact is present at T8-9. There is a right paracentral to foraminal protrusion at T9-10 without suspected impingement.  Dorsal  epidural gas at T12 and L1. Epidural gas at the right L2-3 foramen. An ESI was performed at this level July 2019. Question more recent ESI.  IMPRESSION: 1. L1-2 severe adjacent segment degeneration with canal and foraminal compression. Calcified disc extrusion fills the left L1-2 foramen and extends along the L2 left transverse process. Right paracentral inferiorly migrating calcified disc extrusion effaces the right subarticular recess and tracks out of the right L2-3 foramen. 2. T7-8 sizable disc protrusion with presumed cord flattening. There is a T8-9 herniation which contacts the cord. 3. L2-S1 PLIF with solid arthrodesis. 4. Epidural gas presumably from epidural injection.   Electronically Signed   By: Monte Fantasia M.D.   On: 08/07/2018 09:10    Lumbar DG 1V:   Results for orders placed during the hospital encounter of 04/17/13  DG Lumbar Spine 1 View   Narrative *RADIOLOGY REPORT*  Clinical Data: Level PLIF  LUMBAR SPINE - 1 VIEW  Comparison: Lumbar spine radiographs - 03/12/2013  Findings:  A single spot lateral radiographic images of the lumbar spine is provided for review. Examination is degraded secondary to underpenetration.  Lumbar spine labeling is in keeping with preprocedural lumbar spine radiographs.  Radiopaque surgical instruments are seen posterior to the L2 - L3 intervertebral disc space.  Additional radiopaque surgical support apparatus is seen about the operative site.  IMPRESSION: Intraoperative lumbar spine localization as above.   Original Report Authenticated By: Jake Seats, MD    Lumbar DG 2-3 views:  Results for orders placed during the hospital encounter of 09/15/18  DG Lumbar Spine 2-3 Views   Narrative CLINICAL DATA:  T10-L2 fusion  EXAM: LUMBAR SPINE - 2-3 VIEW; DG C-ARM 61-120 MIN  COMPARISON:  08/06/2018 CT  FINDINGS: Intraoperative spot films of the thoracolumbar spine are submitted postoperatively for interpretation.  Posterior rod and bipedicular screw fixation noted from T10-L1. The new posterior rods are clamped to the existing lumbar spine rods.  Interbody fusion spacers are identified at L1-2.  IMPRESSION: Surgical fusion changes from T10-L2 as described.   Electronically Signed   By: Margarette Canada M.D.   On: 09/15/2018 19:33    Lumbar DG (Complete) 4+V:  Results for orders placed during the hospital encounter of 09/30/18  DG Lumbar Spine Complete   Narrative CLINICAL DATA:  Severe back pain. History of lumbar fusion on 09/15/2018.  EXAM: LUMBAR SPINE - COMPLETE 4+ VIEW  COMPARISON:  Intraoperative lumbar spine radiographs-09/15/2018; lumbar spine CT-08/06/2018  FINDINGS: Stable sequela of cranial extension of previously noted paraspinal lumbar fusion hardware.  Paraspinal fusion hardware is now extending from T10 through S1 with bilateral pedicular screws seen at all levels.  Post L1-L2 intervertebral disc space replacement with restoration of the L1-L2 intervertebral disc space height. Stable sequela of L2-L3, L3-L4, L4-L5 and L5-S1 intervertebral disc space replacement.  Cement material is noted about the T10, T11, T12 and L1 pedicular screws. A small amount of cement is seen within one of the T11 paraspinal lumbar veins.  Limited visualization the bilateral SI joints is normal.  Scattered calcifications within the abdominal aorta. Post cholecystectomy. Sequela of prior gastric lap band procedure.  IMPRESSION: Post cranial extension of now long segment T10-L1 paraspinal fusion and multilevel cement augmentation and intervertebral disc space replacement without definite evidence of hardware failure or loosening. If concern persists, further evaluation with MRI could be performed as indicated.   Electronically Signed   By: Sandi Mariscal M.D.   On: 09/30/2018 18:36  Lumbar DG Epidurogram OP:  Results for orders placed during the hospital encounter of 10/31/12  DG Epidurography   Narrative *RADIOLOGY REPORT*  EPIDUROGRAM S+I  Clinical Data:   Displacement of lumbar intervertebral disc without myelopathy.  Right leg radicular symptoms worse significantly improved for several weeks but now recurred.  There is moderate multifactorial spinal stenosis at L4-5 which has responded to prior injections.  Procedure: Right L4-L5 Interlaminar Epidural Steroid Injection  Fully informed written consent was obtained on the first visit. The patient was placed prone on the fluoroscopic table, the L4-L5 interspace was visualized, and the skin was marked.  After thorough povidone-iodine scrubbing, the site was draped in sterile fashion. 1% lidocaine was used to anesthetize the skin and deeper soft tissues.  A 20-gauge Crawford epidural  needle was inserted and advanced into the epidural space using "loss of resistance" technique with normal saline.  The epidural space was identified on first pass without evidence of blood, cerebrospinal fluid, or paresthesias.  Needle tip placement within the epidural space was confirmed using 1 mL of non-ionic contrast.  There was no intravascular or subarachnoid opacification.  Next, 161m of Depo- Medrol was administered and flushed using 2 mL of normal saline. The needle was restyletted and withdrawn.  The patient tolerated the procedure well and there was no evidence of procedural complication.  The patient was able to ambulate to the recovery area and observed for an appropriate length of time as outlined on the nursing notes. The patient was discharged home with a driver in good condition with detailed patient instructions.  Fluoroscopy time:  15 seconds  IMPRESSION:  Satisfactory interlaminar lumbar epidural steroid injection,  right L4-L5.   Original Report Authenticated By: JRolla Flatten M.D.    Complexity Note: Imaging results reviewed. Results shared with Ms. SJenne Campus using Layman's terms.                        ROS  Cardiovascular: High blood pressure and Needs antibiotics prior to dental procedures Pulmonary or Respiratory: Snoring  Neurological: No reported neurological signs or symptoms such as seizures, abnormal skin sensations, urinary and/or fecal incontinence, being born with an abnormal open spine and/or a tethered spinal cord Psychological-Psychiatric: Depressed Gastrointestinal: Heartburn due to stomach pushing into lungs (Hiatal hernia), Reflux or heatburn and Irregular, infrequent bowel movements (Constipation) Genitourinary: No reported renal or genitourinary signs or symptoms such as difficulty voiding or producing urine, peeing blood, non-functioning kidney, kidney stones, difficulty emptying the bladder, difficulty controlling the flow of urine, or  chronic kidney disease Hematological: No reported hematological signs or symptoms such as prolonged bleeding, low or poor functioning platelets, bruising or bleeding easily, hereditary bleeding problems, low energy levels due to low hemoglobin or being anemic Endocrine: High blood sugar controlled without the use of insulin (NIDDM) Rheumatologic: No reported rheumatological signs and symptoms such as fatigue, joint pain, tenderness, swelling, redness, heat, stiffness, decreased range of motion, with or without associated rash Musculoskeletal: Negative for myasthenia gravis, muscular dystrophy, multiple sclerosis or malignant hyperthermia Work History: Retired  Allergies  Ms. SRolandis allergic to adhesive [tape].  Laboratory Chemistry Profile   Renal Lab Results  Component Value Date   BUN 19 04/20/2020   CREATININE 0.88 04/20/2020   LABCREA 55 010/27/2536  BCR NOT APPLICABLE 064/40/3474  GFR 66.11 04/19/2020   GFRAA >60 04/20/2020   GFRNONAA >60 04/20/2020   SPECGRAV 1.020 05/03/2016   PHUR 5.5 05/03/2016   PROTEINUR NEGATIVE 08/21/2019  Electrolytes Lab Results  Component Value Date   NA 141 04/20/2020   K 3.4 (L) 04/20/2020   CL 103 04/20/2020   CALCIUM 10.1 04/20/2020   MG 1.9 04/20/2020     Hepatic Lab Results  Component Value Date   AST 21 04/20/2020   ALT 14 04/20/2020   ALBUMIN 4.8 04/20/2020   ALKPHOS 71 04/20/2020     ID Lab Results  Component Value Date   SARSCOV2NAA NEGATIVE 01/04/2020   STAPHAUREUS NEGATIVE 08/21/2019   MRSAPCR NEGATIVE 08/21/2019   HCVAB NEGATIVE 08/21/2016     Bone Lab Results  Component Value Date   VD25OH 54.53 04/20/2020     Endocrine Lab Results  Component Value Date   GLUCOSE 116 (H) 04/20/2020   GLUCOSEU NEGATIVE 08/21/2019   HGBA1C 5.9 04/19/2020   TSH 2.24 04/19/2020     Neuropathy Lab Results  Component Value Date   VITAMINB12 1,485 (H) 04/20/2020   HGBA1C 5.9 04/19/2020     CNS No results found for:  COLORCSF, APPEARCSF, RBCCOUNTCSF, WBCCSF, POLYSCSF, LYMPHSCSF, EOSCSF, PROTEINCSF, GLUCCSF, JCVIRUS, CSFOLI, IGGCSF, LABACHR, ACETBL, LABACHR, ACETBL   Inflammation (CRP: Acute  ESR: Chronic) Lab Results  Component Value Date   CRP 0.5 04/20/2020   ESRSEDRATE 11 04/20/2020     Rheumatology No results found for: RF, ANA, LABURIC, URICUR, LYMEIGGIGMAB, LYMEABIGMQN, HLAB27   Coagulation Lab Results  Component Value Date   INR 1.0 08/21/2019   LABPROT 13.2 08/21/2019   APTT 34 08/21/2019   PLT 179.0 04/19/2020     Cardiovascular Lab Results  Component Value Date   HGB 12.2 04/19/2020   HCT 36.9 04/19/2020     Screening Lab Results  Component Value Date   SARSCOV2NAA NEGATIVE 01/04/2020   STAPHAUREUS NEGATIVE 08/21/2019   MRSAPCR NEGATIVE 08/21/2019   HCVAB NEGATIVE 08/21/2016     Cancer No results found for: CEA, CA125, LABCA2   Allergens No results found for: ALMOND, APPLE, ASPARAGUS, AVOCADO, BANANA, BARLEY, BASIL, BAYLEAF, GREENBEAN, LIMABEAN, WHITEBEAN, BEEFIGE, REDBEET, BLUEBERRY, BROCCOLI, CABBAGE, MELON, CARROT, CASEIN, CASHEWNUT, CAULIFLOWER, CELERY     Note: Lab results reviewed.  PFSH  Drug: Ms. Schuff  reports no history of drug use. Alcohol:  reports current alcohol use. Tobacco:  reports that she has never smoked. She has never used smokeless tobacco. Medical:  has a past medical history of Allergy, Anxiety, Arthritis, Chronic back pain, Diabetes mellitus, Dyspnea, GERD (gastroesophageal reflux disease), History of hiatal hernia, Hyperlipidemia, Hypertension, Osteoarthritis, Pneumonia, Spinal stenosis, and UTI (lower urinary tract infection). Family: family history includes Arthritis in her sister; Breast cancer (age of onset: 5) in an other family member; Cancer in her sister and another family member; Dementia in her sister; Diabetes in her brother, brother, sister, and another family member; Heart disease in her father and mother; Hypertension in her  brother, brother, father, and mother; Stroke in her father and mother.  Past Surgical History:  Procedure Laterality Date  . ABDOMINAL HYSTERECTOMY  1973  . ABDOMINAL HYSTERECTOMY    . APPENDECTOMY    . APPLICATION OF ROBOTIC ASSISTANCE FOR SPINAL PROCEDURE N/A 09/15/2018   Procedure: APPLICATION OF ROBOTIC ASSISTANCE FOR SPINAL PROCEDURE;  Surgeon: Kristeen Miss, MD;  Location: [facility];  Service: Neurosurgery;  Laterality: N/A;  . BACK SURGERY  04/17/13, 2017  . CATARACT EXTRACTION W/PHACO Left 12/16/2019   Procedure: CATARACT EXTRACTION PHACO AND INTRAOCULAR LENS PLACEMENT (IOC) LEFT PANOPTIX LENS DIABETIC 8.07 01:19.2 10.2%;  Surgeon: Leandrew Koyanagi, MD;  Location: [facility];  Service: Ophthalmology;  Laterality: Left;  Diabetic - oral meds  . CATARACT EXTRACTION W/PHACO Right 01/06/2020   Procedure: CATARACT EXTRACTION PHACO AND INTRAOCULAR LENS PLACEMENT (IOC) PANOPTIX LENS RIGHT DIABETIC 10.73 01:08.6 15.7%;  Surgeon: Leandrew Koyanagi, MD;  Location: [facility];  Service: Ophthalmology;  Laterality: Right;  diabetic  . CHOLECYSTECTOMY  1995  . HERNIA REPAIR  08/2006  . lap band  08/2003  . SEPTOPLASTY  04/1978  . TONSILLECTOMY AND ADENOIDECTOMY  1979  . TOTAL HIP ARTHROPLASTY  02/2012   Right, Dr. Marry Guan  . TOTAL HIP ARTHROPLASTY Left 08/31/2019   Procedure: TOTAL HIP ARTHROPLASTY;  Surgeon: Dereck Leep, MD;  Location: [facility];  Service: Orthopedics;  Laterality: Left;   Active Ambulatory Problems    Diagnosis Date Noted  . Chronic low back pain (1ry area of Pain) (Bilateral) (R>L) w/o sciatica 10/18/2011  . Hypertension 07/07/2012  . Diabetes mellitus type 2, diet-controlled ([place]) 07/07/2012  . Insomnia 07/07/2012  . Obesity (BMI 30-39.9) 07/07/2012  . Hypercholesterolemia 08/22/2012  . Healthcare maintenance 01/07/2013  . Status post lumbar spine operation 04/17/2013  . Mild depression ([place]) 05/14/2013  . Failed back surgical syndrome (x3)  05/25/2013  . Skin lesion 01/18/2014  . History of bariatric surgery 01/18/2014  . Bilateral shoulder pain 02/24/2014  . Right leg pain 07/27/2014  . 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 Rhinitis, allergic 11/22/2015  . Spondylolisthesis at L5-S1 level 12/06/2015  . Atrophic vaginitis 03/18/2016  . Essential (primary) hypertension 07/07/2012  . Hyperlipidemia 08/22/2012  . Major depressive disorder, single episode 05/14/2013  . GERD (gastroesophageal reflux disease) 12/23/2016  . Aortic atherosclerosis ([place]) 01/11/2018  . Cough 04/13/2018  . Shortness of breath 06/19/2018  . Spinal stenosis 09/15/2018  . Lumbar stenosis with neurogenic claudication 09/15/2018  . Early satiety 07/05/2019  . H/O total hip arthroplasty 08/31/2019  . Weight loss 12/31/2019  . Other abnormal glucose 11/29/2014  . Status post left hip replacement 10/16/2019  . Status post bariatric surgery 01/18/2014  . Chronic pain syndrome 04/20/2020  . Pharmacologic therapy 04/20/2020  . Disorder of skeletal system 04/20/2020  . Problems influencing health status 04/20/2020  . Chronic thoracic spine pain (Bilateral) (R>L) 04/20/2020  . Myofascial pain syndrome of thoracic spine 04/20/2020  . Chronic musculoskeletal pain 04/20/2020  . Spasm of thoracic back muscle 04/20/2020   Resolved Ambulatory Problems    Diagnosis Date Noted  . Bronchitis 07/07/2012  . Pneumonia 07/11/2012  . Acute sinusitis 08/22/2012  . Edema 08/22/2012  . Sinusitis 12/08/2012  . Arthritis 01/07/2013  . Sinusitis, chronic 01/07/2013  . Chronic back pain greater than 3 months duration 01/07/2013  . Recent acute bronchitis 03/18/2013  . Respiratory failure, post-operative ([place]) 04/17/2013  . Facial edema due to porlonged prone position 04/17/2013  . Post-op pain 04/17/2013  . Facial edema 04/17/2013  . Acute maxillary sinusitis 10/08/2013  . Acute bronchitis 02/02/2014  . Viral URI with  cough 02/18/2014  . Diaphoresis 07/27/2014  . Acute bronchitis 11/09/2014  . Diabetes mellitus ([place]) 11/29/2014  . Suprapubic pressure 08/25/2015  . Cellulitis of buttock 12/23/2015  . UTI (urinary tract infection) 01/12/2016  . Acute cystitis without hematuria 01/30/2016  . Recurrent UTI 02/20/2016  . Dysuria 03/18/2016  . History of surgical procedure 01/18/2014  . Adiposity 07/07/2012  . Abnormal blood sugar 11/29/2014   Past Medical History:  Diagnosis Date  . Allergy   . Anxiety   . Chronic back pain   . Diabetes mellitus   .  Dyspnea   . History of hiatal hernia   . Osteoarthritis   . UTI (lower urinary tract infection)    Constitutional Exam  General appearance: Well nourished, well developed, and well hydrated. In no apparent acute distress Vitals:   04/20/20 1143  BP: (!) 156/85  Pulse: 70  Resp: 16  Temp: 98.7 F (37.1 C)  TempSrc: Temporal  SpO2: 100%  Weight: 175 lb (79.4 kg)  Height: _0  (1.651 m)   BMI Assessment: Estimated body mass index is 29.12 kg/m as calculated from the following:   Height as of this encounter: _1  (1.651 m).   Weight as of this encounter: 175 lb (79.4 kg).  BMI interpretation table: BMI level Category Range association with higher incidence of chronic pain  <18 kg/m2 Underweight   18.5-24.9 kg/m2 Ideal body weight   25-29.9 kg/m2 Overweight Increased incidence by 20%  30-34.9 kg/m2 Obese (Class I) Increased incidence by 68%  35-39.9 kg/m2 Severe obesity (Class II) Increased incidence by 136%  >40 kg/m2 Extreme obesity (Class III) Increased incidence by 254%   Patient's current BMI Ideal Body weight  Body mass index is 29.12 kg/m. Ideal body weight: 57 kg (125 lb 10.6 oz) Adjusted ideal body weight: 66 kg (145 lb 6.4 oz)   BMI Readings from Last 4 Encounters:  04/20/20 29.12 kg/m  02/25/20 29.45 kg/m  01/06/20 29.45 kg/m  12/31/19 29.55 kg/m   Wt Readings from Last 4 Encounters:  04/20/20 175 lb (79.4 kg)   02/25/20 177 lb (80.3 kg)  01/06/20 177 lb (80.3 kg)  12/31/19 177 lb 9.6 oz (80.6 kg)   Psych/Mental status: Alert, oriented x 3 (person, place, & time)       Eyes: PERLA Respiratory: No evidence of acute respiratory distress  Cervical Spine Exam  Skin & Axial Inspection: No masses, redness, edema, swelling, or associated skin lesions Alignment: Symmetrical Functional ROM: Unrestricted ROM      Stability: No instability detected Muscle Tone/Strength: Functionally intact. No obvious neuro-muscular anomalies detected. Sensory (Neurological): Unimpaired Palpation: No palpable anomalies              Upper Extremity (UE) Exam    Side: Right upper extremity  Side: Left upper extremity  Skin & Extremity Inspection: Skin color, temperature, and hair growth are WNL. No peripheral edema or cyanosis. No masses, redness, swelling, asymmetry, or associated skin lesions. No contractures.  Skin & Extremity Inspection: Skin color, temperature, and hair growth are WNL. No peripheral edema or cyanosis. No masses, redness, swelling, asymmetry, or associated skin lesions. No contractures.  Functional ROM: Unrestricted ROM          Functional ROM: Unrestricted ROM          Muscle Tone/Strength: Functionally intact. No obvious neuro-muscular anomalies detected.  Muscle Tone/Strength: Functionally intact. No obvious neuro-muscular anomalies detected.  Sensory (Neurological): Unimpaired          Sensory (Neurological): Unimpaired          Palpation: No palpable anomalies              Palpation: No palpable anomalies              Provocative Test(s):  Phalen's test: deferred Tinel's test: deferred Apley's scratch test (touch opposite shoulder):  Action 1 (Across chest): deferred Action 2 (Overhead): deferred Action 3 (LB reach): deferred   Provocative Test(s):  Phalen's test: deferred Tinel's test: deferred Apley's scratch test (touch opposite shoulder):  Action 1 (Across chest): deferred Action 2  (  Overhead): deferred Action 3 (LB reach): deferred    Thoracic Spine Area Exam  Skin & Axial Inspection: No masses, redness, or swelling Alignment: Symmetrical Functional ROM: Unrestricted ROM Stability: No instability detected Muscle Tone/Strength: Functionally intact. No obvious neuro-muscular anomalies detected. Sensory (Neurological): Unimpaired Muscle strength & Tone: No palpable anomalies  Lumbar Exam  Skin & Axial Inspection: Well healed scar from previous spine surgery detected Alignment: Symmetrical Functional ROM: Restricted ROM affecting both sides Stability: No instability detected Muscle Tone/Strength: Increased muscle tone over affected area Sensory (Neurological): Movement-associated pain Palpation: Complains of area being tender to palpation       Provocative Tests: Hyperextension/rotation test: (+) bilaterally for facet joint pain. Lumbar quadrant test (Kemp's test): (+) bilaterally for facet joint pain. Lateral bending test: (-)       Patrick's Maneuver: deferred today                   FABER* test: deferred today                   S-I anterior distraction/compression test: deferred today         S-I lateral compression test: deferred today         S-I Thigh-thrust test: deferred today         S-I Gaenslen's test: deferred today         *(Flexion, ABduction and External Rotation)  Gait & Posture Assessment  Ambulation: Patient ambulates using a cane Gait: Limited. Using assistive device to ambulate Posture: Difficulty standing up straight, due to pain   Lower Extremity Exam    Side: Right lower extremity  Side: Left lower extremity  Stability: No instability observed          Stability: No instability observed          Skin & Extremity Inspection: Skin color, temperature, and hair growth are WNL. No peripheral edema or cyanosis. No masses, redness, swelling, asymmetry, or associated skin lesions. No contractures.  Skin & Extremity Inspection: Skin color,  temperature, and hair growth are WNL. No peripheral edema or cyanosis. No masses, redness, swelling, asymmetry, or associated skin lesions. No contractures.  Functional ROM: Unrestricted ROM                  Functional ROM: Unrestricted ROM                  Muscle Tone/Strength: Able to Toe-walk & Heel-walk without problems  Muscle Tone/Strength: Able to Toe-walk & Heel-walk without problems  Sensory (Neurological): Dermatomal pain pattern L1/L2  Sensory (Neurological): Dermatomal pain pattern L1/L2  DTR: Patellar: deferred today Achilles: deferred today Plantar: deferred today  DTR: Patellar: deferred today Achilles: deferred today Plantar: deferred today  Palpation: No palpable anomalies  Palpation: No palpable anomalies   Assessment  Primary Diagnosis & Pertinent Problem List: The primary encounter diagnosis was Chronic low back pain (1ry area of Pain) (Bilateral) (R>L) w/o sciatica. Diagnoses of Failed back surgical syndrome (x3), Chronic pain syndrome, Pharmacologic therapy, Disorder of skeletal system, Problems influencing health status, Chronic thoracic spine pain (Bilateral) (R>L), Myofascial pain syndrome of thoracic spine, Chronic musculoskeletal pain, and Spasm of thoracic back muscle were also pertinent to this visit.  Visit Diagnosis (New problems to examiner): 1. Chronic low back pain (1ry area of Pain) (Bilateral) (R>L) w/o sciatica   2. Failed back surgical syndrome (x3)   3. Chronic pain syndrome   4. Pharmacologic therapy   5. Disorder of skeletal system  6. Problems influencing health status   7. Chronic thoracic spine pain (Bilateral) (R>L)   8. Myofascial pain syndrome of thoracic spine   9. Chronic musculoskeletal pain   10. Spasm of thoracic back muscle    Plan of Care (Initial workup plan)  Note: Ms. Fischler was reminded that as per protocol, today's visit has been an evaluation only. We have not taken over the patient's controlled substance  management.  Problem-specific plan: No problem-specific Assessment & Plan notes found for this encounter.   Lab Orders     Compliance Drug Analysis, Ur     Comp. Metabolic Panel (12)     Magnesium     Vitamin B12     Sedimentation rate     25-Hydroxy vitamin D Lcms D2+D3     C-reactive protein  Imaging Orders     DG Lumbar Spine Complete W/Bend     DG Thoracic Spine 2 View Referral Orders  No referral(s) requested today   Procedure Orders    No procedure(s) ordered today   Pharmacotherapy (current): Medications ordered:  No orders of the defined types were placed in this encounter.  Medications administered during this visit: Rito Ehrlich. Dower had no medications administered during this visit.   Pharmacological management options:  Opioid Analgesics: The patient was informed that there is no guarantee that she would be a candidate for opioid analgesics. The decision will be made following CDC guidelines. This decision will be based on the results of diagnostic studies, as well as Ms. Prest's risk profile.   Membrane stabilizer: To be determined at a later time  Muscle relaxant: To be determined at a later time  NSAID: To be determined at a later time  Other analgesic(s): To be determined at a later time   Interventional management options: Ms. Wollam was informed that there is no guarantee that she would be a candidate for interventional therapies. The decision will be based on the results of diagnostic studies, as well as Ms. Cayson's risk profile.  Procedure(s) under consideration:  Pending results of ordered test.  However, the patient was informed that the 2 most common causes of muscle spasms are electrolyte and vitamin deficiencies.  She was provided today with some information on how to treat muscle spasms using over-the-counter remedies.   Provider-requested follow-up: Return for (F2F), (2V), (s/p Tests).  Future Appointments  Date Time Provider [place]   04/21/2020 11:00 AM [name], MD [facility]  05/16/2020 10:45 AM Milinda Pointer, MD [facility] None    Note by: Gaspar Cola, MD Date: 04/20/2020; Time: 9:13 PM

## 2020-04-21 ENCOUNTER — Ambulatory Visit (INDEPENDENT_AMBULATORY_CARE_PROVIDER_SITE_OTHER): Payer: Medicare HMO | Admitting: Internal Medicine

## 2020-04-21 ENCOUNTER — Other Ambulatory Visit: Payer: Self-pay

## 2020-04-21 ENCOUNTER — Encounter: Payer: Self-pay | Admitting: Internal Medicine

## 2020-04-21 DIAGNOSIS — I700 Atherosclerosis of aorta: Secondary | ICD-10-CM

## 2020-04-21 DIAGNOSIS — M545 Low back pain, unspecified: Secondary | ICD-10-CM

## 2020-04-21 DIAGNOSIS — E7800 Pure hypercholesterolemia, unspecified: Secondary | ICD-10-CM

## 2020-04-21 DIAGNOSIS — K219 Gastro-esophageal reflux disease without esophagitis: Secondary | ICD-10-CM | POA: Diagnosis not present

## 2020-04-21 DIAGNOSIS — F320 Major depressive disorder, single episode, mild: Secondary | ICD-10-CM | POA: Diagnosis not present

## 2020-04-21 DIAGNOSIS — E876 Hypokalemia: Secondary | ICD-10-CM | POA: Diagnosis not present

## 2020-04-21 DIAGNOSIS — I10 Essential (primary) hypertension: Secondary | ICD-10-CM

## 2020-04-21 DIAGNOSIS — Z9884 Bariatric surgery status: Secondary | ICD-10-CM

## 2020-04-21 DIAGNOSIS — R69 Illness, unspecified: Secondary | ICD-10-CM | POA: Diagnosis not present

## 2020-04-21 DIAGNOSIS — G8929 Other chronic pain: Secondary | ICD-10-CM | POA: Diagnosis not present

## 2020-04-21 DIAGNOSIS — E119 Type 2 diabetes mellitus without complications: Secondary | ICD-10-CM | POA: Diagnosis not present

## 2020-04-21 DIAGNOSIS — F32A Depression, unspecified: Secondary | ICD-10-CM

## 2020-04-21 MED ORDER — TRAMADOL HCL 50 MG PO TABS: 50.0000 mg | ORAL_TABLET | Freq: Two times a day (BID) | ORAL | 0 refills | Status: DC | PRN

## 2020-04-21 NOTE — Progress Notes (Signed)
Patient ID: Erin Camacho, female   DOB: 02-Jul-1946, 74 y.o.   MRN: [id number]   Subjective:    Patient ID: Erin Camacho, female    DOB: 15-Mar-1946, 74 y.o.   MRN: [id number]  HPI This visit occurred during the SARS-CoV-2 public health emergency.  Safety protocols were in place, including screening questions prior to the visit, additional usage of staff PPE, and extensive cleaning of exam room while observing appropriate contact time as indicated for disinfecting solutions.  Patient here for a scheduled follow up. She has chronic low back pain.  Was evaluated in pain clinic yesterday.  States was her initial evaluation.  They are not prescribing pain medication yet.  She request tramadol and muscle relaxer to get her through until they start prescribing.  She tries to stay as active as possible.  No chest pain or sob reported.  Blood pressure doing well.  No abdominal pain or bowel change reported.  On protonix for acid.  Labs reviewed.  Information given on foods with increased potassium.  Handling stress.    Past Medical History:  Diagnosis Date  . Allergy    hay fever  . Anxiety   . Arthritis   . Chronic back pain   . Diabetes mellitus   . Dyspnea    a. 07/2017 Echo: EF 60-65%. No rwma. Mildly dil LA. Nl RV fxn.  Marland Kitchen GERD (gastroesophageal reflux disease)   . History of hiatal hernia   . Hyperlipidemia   . Hypertension   . Osteoarthritis   . Pneumonia   . Spinal stenosis   . UTI (lower urinary tract infection)    Past Surgical History:  Procedure Laterality Date  . ABDOMINAL HYSTERECTOMY  1973  . ABDOMINAL HYSTERECTOMY    . APPENDECTOMY    . APPLICATION OF ROBOTIC ASSISTANCE FOR SPINAL PROCEDURE N/A 09/15/2018   Procedure: APPLICATION OF ROBOTIC ASSISTANCE FOR SPINAL PROCEDURE;  Surgeon: Kristeen Miss, MD;  Location: [facility];  Service: Neurosurgery;  Laterality: N/A;  . BACK SURGERY  04/17/13, 2017  . CATARACT EXTRACTION W/PHACO Left 12/16/2019   Procedure: CATARACT EXTRACTION PHACO  AND INTRAOCULAR LENS PLACEMENT (IOC) LEFT PANOPTIX LENS DIABETIC 8.07 01:19.2 10.2%;  Surgeon: Leandrew Koyanagi, MD;  Location: [facility];  Service: Ophthalmology;  Laterality: Left;  Diabetic - oral meds  . CATARACT EXTRACTION W/PHACO Right 01/06/2020   Procedure: CATARACT EXTRACTION PHACO AND INTRAOCULAR LENS PLACEMENT (IOC) PANOPTIX LENS RIGHT DIABETIC 10.73 01:08.6 15.7%;  Surgeon: Leandrew Koyanagi, MD;  Location: [facility];  Service: Ophthalmology;  Laterality: Right;  diabetic  . CHOLECYSTECTOMY  1995  . HERNIA REPAIR  08/2006  . lap band  08/2003  . SEPTOPLASTY  04/1978  . TONSILLECTOMY AND ADENOIDECTOMY  1979  . TOTAL HIP ARTHROPLASTY  02/2012   Right, Dr. Marry Guan  . TOTAL HIP ARTHROPLASTY Left 08/31/2019   Procedure: TOTAL HIP ARTHROPLASTY;  Surgeon: Dereck Leep, MD;  Location: [facility];  Service: Orthopedics;  Laterality: Left;   Family History  Problem Relation Age of Onset  . Hypertension Mother   . Stroke Mother   . Heart disease Mother   . Hypertension Father   . Stroke Father   . Heart disease Father   . Arthritis Sister   . Diabetes Sister   . Dementia Sister   . Cancer Sister   . Diabetes Brother   . Hypertension Brother   . Diabetes Brother   . Hypertension Brother   . Breast cancer Other 47  . Cancer  Other        breast  . Diabetes Other   . Kidney disease Neg Hx   . Bladder Cancer Neg Hx    Social History   Socioeconomic History  . Marital status: Married    Spouse name: Not on file  . Number of children: Not on file  . Years of education: Not on file  . Highest education level: Not on file  Occupational History  . Not on file  Tobacco Use  . Smoking status: Never Smoker  . Smokeless tobacco: Never Used  Vaping Use  . Vaping Use: Never used  Substance and Sexual Activity  . Alcohol use: Yes    Alcohol/week: 0.0 - 1.0 standard drinks    Comment: occ  . Drug use: No  . Sexual activity: Not Currently  Other Topics  Concern  . Not on file  Social History Narrative   Lives in [place] with husband.      Work - retired [employer]   Social Determinants of Health   Financial Resource Strain:   . Difficulty of Paying Living Expenses:   Food Insecurity:   . Worried About Running Out of Food in the Last Year:   . Ran Out of Food in the Last Year:   Transportation Needs:   . Lack of Transportation (Medical):   . Lack of Transportation (Non-Medical):   Physical Activity:   . Days of Exercise per Week:   . Minutes of Exercise per Session:   Stress:   . Feeling of Stress :   Social Connections:   . Frequency of Communication with Friends and Family:   . Frequency of Social Gatherings with Friends and Family:   . Attends Religious Services:   . Active Member of Clubs or Organizations:   . Attends Club or Organization Meetings:   . Marital Status:     Outpatient Encounter Medications as of 04/21/2020  Medication Sig  . Cholecalciferol (VITAMIN D3) 2000 units TABS Take 2,000 Units by mouth daily.  . fexofenadine (ALLEGRA) 180 MG tablet Take 1 tablet (180 mg total) by mouth daily.  . fluticasone (FLONASE) 50 MCG/ACT nasal spray SPRAY 2 SPRAYS INTO EACH NOSTRIL EVERY DAY (Patient taking differently: Place 2 sprays into both nostrils at bedtime. )  . losartan-hydrochlorothiazide (HYZAAR) 50-12.5 MG tablet TAKE 1 TABLET BY MOUTH EVERY DAY (Patient taking differently: Take 1 tablet by mouth daily. )  . metFORMIN (GLUCOPHAGE) 500 MG tablet TAKE 1 TABLET BY MOUTH EVERY DAY  . metoprolol succinate (TOPROL-XL) 25 MG 24 hr tablet TAKE 1/2 TABLET BY MOUTH EVERY DAY  . Multiple Vitamin (MULTIVITAMIN) tablet Take 1 tablet by mouth daily.  . ONETOUCH VERIO test strip USE TO CHECK BLOOD SUGAR TWICE A DAY  . pantoprazole (PROTONIX) 40 MG tablet TAKE 1 TABLET (40 MG TOTAL) BY MOUTH 2 (TWO) TIMES DAILY BEFORE A MEAL.  . sertraline (ZOLOFT) 100 MG tablet TAKE 1/2 TABLET BY MOUTH (50 MG) BY MOUTH DAILY  .  simvastatin (ZOCOR) 10 MG tablet TAKE 1/2 TABLET (5 MG TOTAL) BY MOUTH AT BEDTIME.  . tiZANidine (ZANAFLEX) 2 MG tablet TAKE 1 TABLET (2 MG TOTAL) BY MOUTH 2 (TWO) TIMES DAILY AS NEEDED FOR MUSCLE SPASMS.  . traMADol (ULTRAM) 50 MG tablet Take 1 tablet (50 mg total) by mouth 2 (two) times daily as needed.  . traZODone (DESYREL) 100 MG tablet TAKE 1 TABLET BY MOUTH EVERYDAY AT BEDTIME  . vitamin B-12 (CYANOCOBALAMIN) 1000 MCG tablet Take 1,000 mcg   by mouth daily.  . [DISCONTINUED] traMADol (ULTRAM) 50 MG tablet Take 1 tablet (50 mg total) by mouth 2 (two) times daily as needed.   No facility-administered encounter medications on file as of 04/21/2020.   Review of Systems  Constitutional: Negative for appetite change and unexpected weight change.  HENT: Negative for congestion and sinus pressure.   Respiratory: Negative for cough, chest tightness and shortness of breath.   Cardiovascular: Negative for chest pain, palpitations and leg swelling.  Gastrointestinal: Negative for abdominal pain, diarrhea, nausea and vomiting.  Genitourinary: Negative for difficulty urinating and dysuria.  Musculoskeletal: Positive for back pain. Negative for joint swelling.  Skin: Negative for color change and rash.  Neurological: Negative for dizziness, light-headedness and headaches.  Psychiatric/Behavioral: Negative for agitation and dysphoric mood.       Objective:    Physical Exam Vitals reviewed.  Constitutional:      General: She is not in acute distress.    Appearance: Normal appearance.  HENT:     Head: Normocephalic and atraumatic.     Right Ear: External ear normal.     Left Ear: External ear normal.  Eyes:     General:        Right eye: No discharge.        Left eye: No discharge.     Conjunctiva/sclera: Conjunctivae normal.  Neck:     Thyroid: No thyromegaly.  Cardiovascular:     Rate and Rhythm: Normal rate and regular rhythm.  Pulmonary:     Effort: No respiratory distress.      Breath sounds: Normal breath sounds. No wheezing.  Abdominal:     General: Bowel sounds are normal.     Palpations: Abdomen is soft.     Tenderness: There is no abdominal tenderness.  Musculoskeletal:        General: No swelling or tenderness.     Cervical back: Neck supple. No tenderness.  Lymphadenopathy:     Cervical: No cervical adenopathy.  Skin:    Findings: No erythema or rash.  Neurological:     Mental Status: She is alert.  Psychiatric:        Mood and Affect: Mood normal.        Behavior: Behavior normal.     BP 126/70   Pulse 73   Temp 97.9 F (36.6 C)   Resp 16   Ht 5' 5" (1.651 m)   Wt 176 lb (79.8 kg)   SpO2 99%   BMI 29.29 kg/m  Wt Readings from Last 3 Encounters:  04/21/20 176 lb (79.8 kg)  04/20/20 175 lb (79.4 kg)  02/25/20 177 lb (80.3 kg)     Lab Results  Component Value Date   WBC 4.9 04/19/2020   HGB 12.2 04/19/2020   HCT 36.9 04/19/2020   PLT 179.0 04/19/2020   GLUCOSE 116 (H) 04/20/2020   CHOL 160 04/19/2020   TRIG 74.0 04/19/2020   HDL 65.20 04/19/2020   LDLDIRECT 146.5 09/19/2012   LDLCALC 80 04/19/2020   ALT 14 04/20/2020   AST 21 04/20/2020   NA 141 04/20/2020   K 3.4 (L) 04/20/2020   CL 103 04/20/2020   CREATININE 0.88 04/20/2020   BUN 19 04/20/2020   CO2 26 04/20/2020   TSH 2.24 04/19/2020   INR 1.0 08/21/2019   HGBA1C 5.9 04/19/2020   MICROALBUR 0.8 04/19/2020    DG Thoracic Spine 2 View  Result Date: 04/21/2020 CLINICAL DATA:  Chronic upper back pain. EXAM: THORACIC SPINE 2 VIEWS   COMPARISON:  None. FINDINGS: Status post surgical posterior fusion involving the T10, T11 and T12 vertebral bodies with bilateral intrapedicular screw placement. No fracture or spondylolisthesis is noted. Mild degenerative disc disease is noted at T8-9 and T9-10 with anterior osteophyte formation. IMPRESSION: Status post surgical posterior fusion as described above. No acute abnormality seen in the thoracic spine. Electronically Signed   By:  James  Green Jr M.D.   On: 04/21/2020 08:39   DG Lumbar Spine Complete W/Bend  Result Date: 04/21/2020 CLINICAL DATA:  Chronic low back pain. EXAM: LUMBAR SPINE - COMPLETE WITH BENDING VIEWS COMPARISON:  December 10, 2018. FINDINGS: Status post surgical posterior fusion extending from T10 to S1 with bilateral intrapedicular screw placement. Interbody fusion is noted extending from L1-2 to L5-S1. No fracture or spondylolisthesis is noted. No change in vertebral body alignment is noted on flexion or extension views. IMPRESSION: Postsurgical changes as described above. No acute abnormality seen in the lumbar spine. No change in vertebral body alignment is noted on flexion or extension views. Electronically Signed   By: James  Green Jr M.D.   On: 04/21/2020 08:37       Assessment & Plan:   Problem List Items Addressed This Visit    Aortic atherosclerosis (HCC)    On simvastatin.       Chronic low back pain (1ry area of Pain) (Bilateral) (R>L) w/o sciatica (Chronic)    Seeing pain clinic now.  Had initial evaluation yesterday.  Refilled tizanidine and tramadol.  They are not prescribing pain medication yet.  Has f/u at the end of the month.  Pain limits activity.  Will start getting pain medication through pain clinic once able.        Relevant Medications   traMADol (ULTRAM) 50 MG tablet   Diabetes mellitus type 2, diet-controlled (HCC)    Low carb diet and exercise.  Follow met b and a1c.       GERD (gastroesophageal reflux disease)    On protonix.  Follow.        History of bariatric surgery    Doing well. Eating.  On protonix for acid reflux.  Follow cbc.       Hypercholesterolemia    On simvastatin.  Low cholesterol diet and exercise.  Follow lipid panel and liver function tests.        Hypertension    Blood pressure on recheck improved. Has been under good control.  Continue metoprolol and losartan/hctz.  Follow pressures.  Follow metabolic panel.        Hypokalemia     Discussed labs.  Given information on foods with increased potassium.  Recheck potassium in 2-3 weeks.        Relevant Orders   Potassium   Mild depression (HCC)    On zoloft.  Stable.  Follow.            Charlene Scott, MD 

## 2020-04-26 DIAGNOSIS — J301 Allergic rhinitis due to pollen: Secondary | ICD-10-CM | POA: Diagnosis not present

## 2020-04-26 LAB — COMPLIANCE DRUG ANALYSIS, UR

## 2020-04-27 DIAGNOSIS — J301 Allergic rhinitis due to pollen: Secondary | ICD-10-CM | POA: Diagnosis not present

## 2020-04-28 ENCOUNTER — Encounter: Payer: Self-pay | Admitting: Internal Medicine

## 2020-04-28 DIAGNOSIS — E876 Hypokalemia: Secondary | ICD-10-CM | POA: Insufficient documentation

## 2020-04-28 NOTE — Assessment & Plan Note (Signed)
On simvastatin.  Low cholesterol diet and exercise.  Follow lipid panel and liver function tests.   

## 2020-04-28 NOTE — Assessment & Plan Note (Signed)
Discussed labs.  Given information on foods with increased potassium.  Recheck potassium in 2-3 weeks.

## 2020-04-28 NOTE — Assessment & Plan Note (Signed)
Low carb diet and exercise.  Follow met b and a1c.  

## 2020-04-28 NOTE — Assessment & Plan Note (Signed)
Blood pressure on recheck improved. Has been under good control.  Continue metoprolol and losartan/hctz.  Follow pressures.  Follow metabolic panel.

## 2020-04-28 NOTE — Assessment & Plan Note (Signed)
Doing well. Eating.  On protonix for acid reflux.  Follow cbc.

## 2020-04-28 NOTE — Assessment & Plan Note (Signed)
On protonix.  Follow.  

## 2020-04-28 NOTE — Assessment & Plan Note (Signed)
Seeing pain clinic now.  Had initial evaluation yesterday.  Refilled tizanidine and tramadol.  They are not prescribing pain medication yet.  Has f/u at the end of the month.  Pain limits activity.  Will start getting pain medication through pain clinic once able.

## 2020-04-28 NOTE — Assessment & Plan Note (Signed)
On simvastatin.   

## 2020-04-28 NOTE — Assessment & Plan Note (Signed)
On zoloft.  Stable.  Follow.  

## 2020-05-03 DIAGNOSIS — J301 Allergic rhinitis due to pollen: Secondary | ICD-10-CM | POA: Diagnosis not present

## 2020-05-04 ENCOUNTER — Other Ambulatory Visit: Payer: Self-pay

## 2020-05-04 ENCOUNTER — Other Ambulatory Visit (INDEPENDENT_AMBULATORY_CARE_PROVIDER_SITE_OTHER): Payer: Medicare HMO

## 2020-05-04 DIAGNOSIS — E876 Hypokalemia: Secondary | ICD-10-CM | POA: Diagnosis not present

## 2020-05-04 LAB — POTASSIUM: Potassium: 3.9 meq/L (ref 3.5–5.1)

## 2020-05-08 DIAGNOSIS — R69 Illness, unspecified: Secondary | ICD-10-CM | POA: Diagnosis not present

## 2020-05-13 ENCOUNTER — Telehealth: Payer: Self-pay | Admitting: Internal Medicine

## 2020-05-13 ENCOUNTER — Other Ambulatory Visit: Payer: Self-pay | Admitting: Internal Medicine

## 2020-05-13 DIAGNOSIS — G8929 Other chronic pain: Secondary | ICD-10-CM

## 2020-05-13 NOTE — Telephone Encounter (Signed)
Patient would like to know if Dr. Nicki Reaper would call her in 15 traMADol (ULTRAM) 50 MG tablet pills. She is having muscle spasms and doesn't go to Pain Management until Monday.

## 2020-05-14 ENCOUNTER — Other Ambulatory Visit: Payer: Self-pay | Admitting: Internal Medicine

## 2020-05-14 MED ORDER — TRAMADOL HCL 50 MG PO TABS: 50.0000 mg | ORAL_TABLET | Freq: Two times a day (BID) | ORAL | 0 refills | Status: DC | PRN

## 2020-05-14 NOTE — Telephone Encounter (Signed)
rx sent in for ultram (8 tabletes) to get her through until her appt with pain clinic.

## 2020-05-15 NOTE — Progress Notes (Signed)
PROVIDER NOTE: Information contained herein reflects review and annotations entered in association with encounter. Interpretation of such information and data should be left to medically-trained personnel. Information provided to patient can be located elsewhere in the medical record under "Patient Instructions". Document created using STT-dictation technology, any transcriptional errors that may result from process are unintentional.    Patient: Erin Camacho  Service Category: E/M  Provider: Oswaldo Done, MD  DOB: 11/06/1946  DOS: 05/16/2020  Specialty: Interventional Pain Management  MRN: [id number]  Setting: Ambulatory outpatient  PCP: Dale Pump Back, MD  Type: Established Patient    Referring Provider: Dale , MD  Location: Office  Delivery: Face-to-face     Primary Reason(s) for Visit: Encounter for evaluation before starting new chronic pain management plan of care (Level of risk: moderate) CC: Back Pain (mid bilateral) and Leg Pain (left)  HPI  [name] is a 74 y.o. year old, female patient, who comes today for a follow-up evaluation to review the test results and decide on a treatment plan. She has Chronic low back pain (1ry area of Pain) (Bilateral) (R>L) w/o sciatica; Hypertension; Diabetes mellitus type 2, diet-controlled (HCC); Insomnia; Obesity (BMI 30-39.9); Hypercholesterolemia; Healthcare maintenance; Mild depression (HCC); Failed back surgical syndrome (x3); Skin lesion; History of bariatric surgery; Chronic shoulder pain (Bilateral); Right leg pain; Neuritis or radiculitis due to rupture of lumbar intervertebral disc; Rhinitis, allergic; Spondylolisthesis at L5-S1 level; Atrophic vaginitis; Essential (primary) hypertension; Hyperlipidemia; Major depressive disorder, single episode; GERD (gastroesophageal reflux disease); Aortic atherosclerosis (HCC); Cough; Shortness of breath; Lumbar stenosis with neurogenic claudication; Early satiety; History of total hip replacement  (Left); Weight loss; Other abnormal glucose; Status post bariatric surgery; Chronic pain syndrome; Pharmacologic therapy; Disorder of skeletal system; Problems influencing health status; Chronic thoracic spine pain (Bilateral) (R>L); Myofascial pain syndrome of thoracic spine; Chronic musculoskeletal pain; Spasm of thoracic back muscle; Hypokalemia; Uncomplicated opioid dependence (HCC); and Opioid use (10 MME/day) on their problem list. Her primarily concern today is the Back Pain (mid bilateral) and Leg Pain (left)  Pain Assessment: Location: Mid Back Radiating: back left to right, left is thigh pain from hip surgery Onset: More than a month ago Duration: Chronic pain Quality: Aching, Burning Severity: 7 /10 (subjective, self-reported pain score)  Note: Reported level is inconsistent with clinical observations. Clinically the patient looks like a 3/10 A 3/10 is viewed as "Moderate" and described as significantly interfering with activities of daily living (ADL). It becomes difficult to feed, bathe, get dressed, get on and off the toilet or to perform personal hygiene functions. Difficult to get in and out of bed or a chair without assistance. Very distracting. With effort, it can be ignored when deeply involved in activities. Information on the proper use of the pain scale provided to the patient today. When using our objective Pain Scale, levels between 6 and 10/10 are said to belong in an emergency room, as it progressively worsens from a 6/10, described as severely limiting, requiring emergency care not usually available at an outpatient pain management facility. At a 6/10 level, communication becomes difficult and requires great effort. Assistance to reach the emergency department may be required. Facial flushing and profuse sweating along with potentially dangerous increases in heart rate and blood pressure will be evident. Effect on ADL: prolonged walking, standing, bending, twisting, picking  things up Timing: Constant Modifying factors: medication but has ran out BP: 122/67  HR: (!) 56  [name] comes in today for a follow-up visit after her initial  evaluation on 04/20/2020. Today we went over the results of her tests. These were explained in "Layman's terms". During today's appointment we went over my diagnostic impression, as well as the proposed treatment plan.  According to the patient the primary area of pain is that of the lower back, bilaterally, with the right side being worse than the left.  She describes having mid back spasms with pain and numbness in the distribution of the L2 nerve root, bilaterally.  The patient also has a history of bilateral total hip replacement with the right side done in 2013 and the left side done in 2020.  In terms of the patient's lower back she indicates having had 3 prior back surgeries by Dr. Kristeen Miss with the first 1 having been in 2014.  Prior to that surgery she was experiencing back pain that was worse than lower extremity pain in the lower extremity pain was in the right side.  After the surgery which apparently involved the L2-L4 levels, she remained with some residual right lower extremity problems which she was told may be secondary to permanent nerve injury.  Her second lumbar surgery was done in 2017 at the L5-S1 level.  At that point she was experiencing significant right lower extremity pain and low back pain but this time the right lower extremity pain predominated.  The patient indicated the pain was again in the same area where she had it before.  The second and third surgery were also done by Dr. Ellene Route.  [name] third back surgery was done in 2019 but after 1 week she began to experience excruciating low back pain.  She indicated that this particular surgery involved placing rods from T10 down to L1.  Currently she describes they pain as a muscle cramping sensation.  She indicated having had an epidural steroid injection done by  Dr. Kristeen Miss, but after the injection actually the spasms got worse and the pain did not improve.  Today we have gone over the results of the patient's tests, both the lab work and they imaging studies.  The lab work was completely within normal limits except for a mild increase in the patient's blood glucose levels and high levels of vitamin B-12, which are easily explained by the patient's intake of additional vitamin B12.  Today I have recommended the patient to change her schedule and instead of taking the vitamin B on a daily basis, to go to every other day.  Levels can be later followed up by PCP.   Thoracic spine diagnostic x-rays show no acute pathology.  They also to show the patient's extensive thoracolumbar fusion and hardware.  Flexion and extension views of the lumbar spine show extensive postsurgical changes with no instability or acute pathology.    Review of prior events of multiple (8) epidural steroid injections/epidurography from 12/14/2010 to 10/31/2012.  Please see below.  Interestingly, the patient's primary problem, as described by her, is intermittent spasms of the thoracolumbar spine.  The patient describes taking tizanidine 2 mg p.o. twice daily on a regular basis, as well as tramadol 50 mg 1 tablet p.o. twice daily on a PRN basis.  She indicates using an average total of 60 pills/month, of each of these medications.  Previously I have provided the patient with some written information on the most common causes of spasms as well as some over-the-counter treatments for muscle spasms.  Today I asked her if she had tried any of these and she indicated that she has  started taking some of the magnesium.  Originally I had recommended taking 250 mg p.o. daily, but in view of the fact that her lab work came back with no renal problems, I have recommended that she bring this up to 500 mg p.o. twice daily.  In addition she indicated that she was unable to take the tonic water since she  did not like the taste.  Today I have provided the patient with simple way to take the tonic water and at the same time enjoy it.  I have recommended that she serve it cold with ice and to add to it some Clementine orange segments, mixed with dehydrated lemon crystals.  This usually will address the issue of the bitterness that comes from the quinine.  The patient was recommended to take this once a day at bedtime.  Today we also spoke about the possible causes of localized spasms in the area where she had her fusion.  I have explained to the patient that the pedicle screws are usually placed right at the point where the medial branch that goes to the facet joint and the paravertebral muscle across the lateral aspect of the facet joint.  This typically results in permanent damage to the medial nerve at the level where the pedicle screw is implanted.  Some of these can develop neuromas that can in turn trigger muscle spasms for no apparent reason.  Unfortunately, there is not much that can be done since it is very difficult to test which nerve is the one triggering the spasms without having a sustained muscle contraction of the affected paravertebral muscle.  Although thoracolumbar facet blocks may provide her with some temporary relief of the pain, as previously stated it would be very difficult to extend the duration of those blocks with radiofrequency due to the extensive surgical changes and anatomical changes.  I did mention to the patient that should the muscle spasms continue and not improve with conservative therapy, there is always the possibility of trying some Botox injections into the muscle to decrease the intensity of those spasms.  Very similar to the treatment of contractures secondary to permanent spinal cord injury and spasticity.  The patient indicated not liking the idea of any injections.  She seems to already have her mind made up regarding the continued use of the tizanidine and  tramadol.  In considering the treatment plan options, Ms. Gabor was reminded that I no longer take patients for medication management only. I asked her to let me know if she had no intention of taking advantage of the interventional therapies, so that we could make arrangements to provide this space to someone interested. I also made it clear that undergoing interventional therapies for the purpose of getting pain medications is very inappropriate on the part of a patient, and it will not be tolerated in this practice. This type of behavior would suggest true addiction and therefore it requires referral to an addiction specialist.   At this point, the patient has indicated her preference to stay with the current regimen medication regimen, which I find it to be adequate and appropriate.  Today we have provided the patient with a single prescription of the tramadol and tizanidine to last her for 60 days, after which she will need to obtain it from her referring physician.  Further details on both, my assessment(s), as well as the proposed treatment plan, please see below.  Controlled Substance Pharmacotherapy Assessment REMS (Risk Evaluation and Mitigation Strategy)  Analgesic:  No opioid analgesics prescribed by our practice.  Tramadol 50 mg, 1 tab PO BID (100 mg/day of tramadol) (10 MME/day) Highest recorded MME/day: 64.29 mg/day MME/day: 10 mg/day  Pill Count: None expected due to no prior prescriptions written by our practice. Ignatius Specking, RN  05/16/2020 11:32 AM  Sign when Signing Visit Safety precautions to be maintained throughout the outpatient stay will include: orient to surroundings, keep bed in low position, maintain call bell within reach at all times, provide assistance with transfer out of bed and ambulation.    Pharmacokinetics: Liberation and absorption (onset of action): WNL Distribution (time to peak effect): WNL Metabolism and excretion (duration of action): WNL          Pharmacodynamics: Desired effects: Analgesia: Ms. Gillham reports >50% benefit. Functional ability: Patient reports that medication allows her to accomplish basic ADLs Clinically meaningful improvement in function (CMIF): Sustained CMIF goals met Perceived effectiveness: Described as relatively effective, allowing for increase in activities of daily living (ADL) Undesirable effects: Side-effects or Adverse reactions: None reported Monitoring: [place] PMP: PDMP reviewed during this encounter. Online review of the past 64-monthperiod previously conducted. Not applicable at this point since we have not taken over the patient's medication management yet. List of other Serum/Urine Drug Screening Test(s):  No results found. List of all UDS test(s) done:  Lab Results  Component Value Date   SUMMARY Note 04/20/2020   Last UDS on record: Summary  Date Value Ref Range Status  04/20/2020 Note  Final    Comment:    ==================================================================== Compliance Drug Analysis, Ur ==================================================================== Test                             Result       Flag       Units Drug Present and Declared for Prescription Verification   Tramadol                       504          EXPECTED   ng/mg creat   O-Desmethyltramadol            4920         EXPECTED   ng/mg creat   N-Desmethyltramadol            298          EXPECTED   ng/mg creat    Source of tramadol is a prescription medication. O-desmethyltramadol    and N-desmethyltramadol are expected metabolites of tramadol. Drug Absent but Declared for Prescription Verification   Tizanidine                     Not Detected UNEXPECTED    Tizanidine, as indicated in the declared medication list, is not    always detected even when used as directed.   Sertraline                     Not Detected UNEXPECTED   Trazodone                      Not Detected UNEXPECTED   Metoprolol                      Not Detected UNEXPECTED ==================================================================== Test  Result    Flag   Units      Ref Range   Creatinine              50               mg/dL      >=33 ==================================================================== Declared Medications:  The flagging and interpretation on this report are based on the  following declared medications.  Unexpected results may arise from  inaccuracies in the declared medications.  **Note: The testing scope of this panel includes these medications:  Metoprolol (Toprol)  Sertraline (Zoloft)  Tramadol (Ultram)  Trazodone (Desyrel)  **Note: The testing scope of this panel does not include small to  moderate amounts of these reported medications:  Tizanidine (Zanaflex)  **Note: The testing scope of this panel does not include the  following reported medications:  Cyanocobalamin  Fexofenadine (Allegra)  Fluticasone (Flonase)  Hydrochlorothiazide (Hyzaar)  Losartan (Hyzaar)  Metformin (Glucophage)  Multivitamin  Pantoprazole (Protonix)  Simvastatin (Zocor)  Vitamin D3 ==================================================================== For clinical consultation, please call [phone]. ====================================================================    UDS interpretation: No unexpected findings.          Medication Assessment Form: Not applicable. Treatment compliance: Not applicable Risk Assessment Profile: Aberrant behavior: See initial evaluations. None observed or detected today Comorbid factors increasing risk of overdose: See initial evaluation. No additional risks detected today Opioid risk tool (ORT):  Opioid Risk  05/16/2020  Alcohol 0  Illegal Drugs 0  Rx Drugs 0  Alcohol 0  Illegal Drugs 0  Rx Drugs 0  Age between 16-45 years  0  History of Preadolescent Sexual Abuse 0  Psychological Disease 2  ADD Negative  OCD Negative  Bipolar Negative   Depression -  Opioid Risk Tool Scoring 2  Opioid Risk Interpretation Low Risk    ORT Scoring interpretation table:  Score <3 = Low Risk for SUD  Score between 4-7 = Moderate Risk for SUD  Score >8 = High Risk for Opioid Abuse   Risk of substance use disorder (SUD): Low  Risk Mitigation Strategies:  Patient opioid safety counseling: Not applicable. Patient-Prescriber Agreement (PPA): No agreement signed.  Controlled substance notification to other providers: Not applicable  Pharmacologic Plan: The patient is an adequate candidate for the long-term use of low-dose opioid analgesics on a as needed basis for her muscle spasms.  She is not really interested in any interventional therapies and therefore I will not be taking over her case for medication management.             Laboratory Chemistry Profile   Renal Lab Results  Component Value Date   BUN 19 04/20/2020   CREATININE 0.88 04/20/2020   LABCREA 55 02/13/2019   BCR NOT APPLICABLE 02/13/2019   GFR 66.11 04/19/2020   GFRAA >60 04/20/2020   GFRNONAA >60 04/20/2020   SPECGRAV 1.020 05/03/2016   PHUR 5.5 05/03/2016   PROTEINUR NEGATIVE 08/21/2019     Electrolytes Lab Results  Component Value Date   NA 141 04/20/2020   K 3.9 05/04/2020   CL 103 04/20/2020   CALCIUM 10.1 04/20/2020   MG 1.9 04/20/2020     Hepatic Lab Results  Component Value Date   AST 21 04/20/2020   ALT 14 04/20/2020   ALBUMIN 4.8 04/20/2020   ALKPHOS 71 04/20/2020     ID Lab Results  Component Value Date   SARSCOV2NAA NEGATIVE 01/04/2020   STAPHAUREUS NEGATIVE 08/21/2019   MRSAPCR NEGATIVE 08/21/2019   HCVAB NEGATIVE 08/21/2016  Bone Lab Results  Component Value Date   VD25OH 54.53 04/20/2020     Endocrine Lab Results  Component Value Date   GLUCOSE 116 (H) 04/20/2020   GLUCOSEU NEGATIVE 08/21/2019   HGBA1C 5.9 04/19/2020   TSH 2.24 04/19/2020     Neuropathy Lab Results  Component Value Date   VITAMINB12 1,485 (H)  04/20/2020   HGBA1C 5.9 04/19/2020     CNS No results found.   Inflammation (CRP: Acute  ESR: Chronic) Lab Results  Component Value Date   CRP 0.5 04/20/2020   ESRSEDRATE 11 04/20/2020     Rheumatology No results found.   Coagulation Lab Results  Component Value Date   INR 1.0 08/21/2019   LABPROT 13.2 08/21/2019   APTT 34 08/21/2019   PLT 179.0 04/19/2020     Cardiovascular Lab Results  Component Value Date   HGB 12.2 04/19/2020   HCT 36.9 04/19/2020     Screening Lab Results  Component Value Date   SARSCOV2NAA NEGATIVE 01/04/2020   STAPHAUREUS NEGATIVE 08/21/2019   MRSAPCR NEGATIVE 08/21/2019   HCVAB NEGATIVE 08/21/2016     Cancer No results found.   Allergens No results found.     Note: Lab results reviewed.  Recent Diagnostic Imaging Review  Thoracic Imaging: CT THORACIC SPINE WO CONTRAST  Narrative CLINICAL DATA:  Spinal stenosis, lumbar region, with neurogenic claudication  EXAM: CT THORACIC SPINE WITHOUT CONTRAST; CT LUMBAR SPINE WITHOUT CONTRAST  TECHNIQUE: Multidetector CT images of the thoracic were obtained using the standard protocol without intravenous contrast. Levels of coverage are from the T3-4 interspace through the mid sacrum.  COMPARISON:  Lumbar spine MRI 07/20/2018  FINDINGS: Alignment: Mild L1-2 retrolisthesis.  Fused L5-S1 anterolisthesis.  Vertebrae: L2-S1 PLIF with solid arthrodesis. No hardware complication.  No acute fracture.  No discitis or aggressive bone lesion  Paraspinal and other soft tissues: Lap band at the GE junction.  Disc levels: L1-2 severe adjacent segment degeneration with vacuum phenomenon within the disc space that shows endplate sclerosis and irregularity. Extruded calcified disc severely narrows the left L1-2 foramen with tracking superior to the left L2 transverse process in the paravertebral space. Calcified herniation is also seen extending inferiorly from the right subarticular recess  into the right L2-3 foramen.  Spondylosis and disc narrowing in the thoracic spine. There is a sizable herniation at T7-8 with presumed cord flattening. A smaller herniation with cord contact is present at T8-9. There is a right paracentral to foraminal protrusion at T9-10 without suspected impingement.  Dorsal epidural gas at T12 and L1. Epidural gas at the right L2-3 foramen. An ESI was performed at this level July 2019. Question more recent ESI.  IMPRESSION: 1. L1-2 severe adjacent segment degeneration with canal and foraminal compression. Calcified disc extrusion fills the left L1-2 foramen and extends along the L2 left transverse process. Right paracentral inferiorly migrating calcified disc extrusion effaces the right subarticular recess and tracks out of the right L2-3 foramen. 2. T7-8 sizable disc protrusion with presumed cord flattening. There is a T8-9 herniation which contacts the cord. 3. L2-S1 PLIF with solid arthrodesis. 4. Epidural gas presumably from epidural injection.   Electronically Signed By: Monte Fantasia M.D. On: 08/07/2018 09:10  DG Thoracic Spine 2 View  Narrative CLINICAL DATA:  Chronic upper back pain.  EXAM: THORACIC SPINE 2 VIEWS  COMPARISON:  None.  FINDINGS: Status post surgical posterior fusion involving the T10, T11 and T12 vertebral bodies with bilateral intrapedicular screw placement. No fracture or spondylolisthesis is  noted. Mild degenerative disc disease is noted at T8-9 and T9-10 with anterior osteophyte formation.  IMPRESSION: Status post surgical posterior fusion as described above. No acute abnormality seen in the thoracic spine.   Electronically Signed By: Marijo Conception M.D. On: 04/21/2020 08:39  DG THORACOLUMABAR SPINE  Narrative CLINICAL DATA:  Postoperative pain.  EXAM: THORACOLUMBAR SPINE 1V  COMPARISON:  09/15/2018 CT 08/06/2018.  FINDINGS: Multilevel thoracolumbar spine fusion. Hardware intact.  Stable anterolisthesis L5 on S1. No acute bony abnormality. No evidence of fracture. Surgical clips right upper quadrant.  IMPRESSION: Multilevel thoracolumbar spine fusion. Hardware intact. Stable anterolisthesis L5 on S1. No acute bony abnormality.   Electronically Signed By: Marcello Moores  Register On: 09/18/2018 13:33  Lumbosacral Imaging: MR LUMBAR SPINE WO CONTRAST  Narrative CLINICAL DATA:  Acute onset low back pain. Status post lumbar spine surgery September 15, 2018. Bowel and bladder incontinence, suspect cauda equina syndrome.  EXAM: MRI LUMBAR SPINE WITHOUT CONTRAST  TECHNIQUE: Multiplanar, multisequence MR imaging of the lumbar spine was performed. No intravenous contrast was administered.  COMPARISON:  Lumbar spine radiographs July 31, 2018 and MRI lumbar spine July 20, 2018  FINDINGS: SEGMENTATION: For the purposes of this report, the last well-formed intervertebral disc is reported as L5-S1.  ALIGNMENT: Maintained lumbar lordosis. Stable grade 1 L5-S1 anterolisthesis.  VERTEBRAE:Vertebral bodies are intact. New L1-2 disc prosthesis with cephalad extension of spinal fusion, status post thoracolumbar PLIF, revised at L1. Pedicle screws result in susceptibility artifact. Bridging bone marrow signal L2-3 through L5-S1 compatible with arthrodesis. Moderate T11-12 disc height loss similar to prior examination with mild desiccation of the nonsurgically altered thoracic discs. No suspicious or acute bone marrow signal. Central low signal within T11 through L1 consistent with vertebral body cement augmentation.  CONUS MEDULLARIS AND CAUDA EQUINA: Conus medullaris terminates at L1-2, no abnormal signal though hardware artifact limits assessment. No nerve root clumping.  PARASPINAL AND OTHER SOFT TISSUES: Severe paraspinal muscle postoperative denervation. Ill-defined low T1, bright T2 2.9 x 4 cm fluid collection extending 12.4 cm from T11-L3 within  the subcutaneous fat along surgical approach.  DISC LEVELS:  Axial images not obtained through thoracic spine. No canal stenosis. No high-grade neural foraminal narrowing the limited by hardware artifact.  L1-2: Disc prosthesis. Posterior decompression. No canal stenosis or neural foraminal narrowing, improved from prior examination.  L2-3, L3-4, L4-5: PLIF. Posterior decompression without canal stenosis or neural foraminal narrowing.  L5-S1: Anterolisthesis. PLIF. No canal stenosis. Similar mild RIGHT neural foraminal narrowing.  IMPRESSION: 1. Thoracolumbar spinal fusion, revised at L1 with new thoracic component, status post included thoracic and L1 cement augmentation. Postoperative paraspinal seroma, unlikely to reflect abscess or pseudomeningocele. 2. No acute fracture.  Chronic grade 1 L5-S1 anterolisthesis. 3. No canal stenosis. Mild similar RIGHT L5-S1 neural foraminal narrowing.   Electronically Signed By: Elon Alas M.D. On: 09/30/2018 21:57  MR Lumbar Spine Wo Contrast  Narrative Clinical Data: Low back pain.  Spondylolisthesis.  Back pain and right leg pain for 1 year.  MRI LUMBAR SPINE WITHOUT CONTRAST  Technique:  Multiplanar and multiecho pulse sequences of the lumbar spine were obtained without intravenous contrast.  Comparison: None.  Findings: The numbering convention used for this exam terms L5-S1 as the last full intervertebral disc space above the sacrum.  Grade 1 anterolisthesis of L4 on L5 is present.  There is increased T1 and T2 signal in the T12 and L2 vertebral bodies consistent with large hemangiomata.  Otherwise  the marrow signal is within normal  limits.  The paraspinal soft tissues appear normal.  No pars defects are identified.  Severe L4-L5 facet arthrosis accounting for anterolisthesis.  Minimal grade 1  anterolisthesis of L5 on S1 appears degenerative associated with facet arthrosis.  L1-L2:  Disc desiccation and loss  of height with shallow disc protrusion.  No stenosis. L2-L3:  Disc desiccation with L3 the superior endplate Schmorl's node.  Broad-based posterior disc protrusion with small right paracentral extrusion that has minimal cranial extension.  Moderate central stenosis predominately due to disc protrusion/extrusion. Mild bilateral facet hypertrophy.  Prominent posterior epidural fat.  The foramina appear adequately patent. L3-L4:  Degenerated disc with broad-based posterior disc extrusion. Mild to moderate central stenosis is multifactorial.  There is a right greater than left degenerative facet disease with ligamentum flavum redundancy contributing to central stenosis.  Moderate right and mild left foraminal stenosis secondary to disc and facet disease.  Mild narrowing of the right lateral recess. L4-L5:  Severe central stenosis with effacement of the subarachnoid space.  Severe bilateral facet arthritis with right facet effusion. Bilateral subarticular lateral recess stenosis is multifactorial. Broad-based posterior disc protrusion and uncoverage associated with slip. L5-S1:  Moderate right greater than left bilateral facet arthrosis. Mild central stenosis secondary to facet disease and a shallow broad-based posterior disc protrusion.  Minimal grade 1 anterolisthesis of L5 on S1.  Foramina appear adequately patent.  IMPRESSION: 1.  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  Grade 1 anterolisthesis due to severe bilateral facet arthritis. 2.  L5-S1 moderate bilateral facet arthrosis with trace anterolisthesis, grade 1.  Mild central stenosis.  Broad-based posterior protrusion contacts both descending S1 nerve roots in the lateral recess without neural compression.  3.  Less pronounced L2-L3 and L3-L4 degenerative disease and stenosis.  Provider: Isaiah Blakes  MR Lumbar  Spine W Wo Contrast  Narrative CLINICAL DATA:  Lumbar stenosis without neurogenic claudication. History of lumbar fusion.  EXAM: MRI LUMBAR SPINE WITHOUT AND WITH CONTRAST  TECHNIQUE: Multiplanar and multiecho pulse sequences of the lumbar spine were obtained without and with intravenous contrast.  CONTRAST:  2mL MULTIHANCE GADOBENATE DIMEGLUMINE 529 MG/ML IV SOLN  COMPARISON:  Lumbar MRI 10/11/2015  FINDINGS: Segmentation:  Normal  Alignment: 5 mm retrolisthesis L1-2 unchanged. 7 mm anterolisthesis L5-S1 has progressed.  Vertebrae: Negative for fracture or mass. Pedicle screw and interbody fusion L2 through S1  Conus medullaris and cauda equina: Conus extends to the L1-2 level. Conus and cauda equina appear normal.  Paraspinal and other soft tissues: Paraspinous muscle atrophy is advanced. No paraspinous soft tissue mass.  Disc levels:  T11-12: Mild disc bulging  T12-L1: Normal disc.  Mild facet degeneration without stenosis  L1-2: Advanced disc degeneration which has progressed in the interval. Diffuse endplate spurring has progressed. Advanced facet degeneration progression. Severe spinal stenosis and severe subarticular and foraminal stenosis bilaterally has progressed in the interval. Findings consistent with adjacent segment degeneration.  L2-3: Pedicle screw and interbody fusion without stenosis  L3-4: Pedicle screw and interbody fusion without stenosis  L4-5: Pedicle screw and interbody fusion without stenosis  L5-S1: 7 mm anterolisthesis. Interval pedicle screw and interbody fusion without stenosis.  IMPRESSION: PLIF L2 through S1 without stenosis  Progression of adjacent segment degeneration at L1-2 now with severe spinal stenosis and severe subarticular foraminal stenosis bilaterally.   Electronically Signed By: Marlan Palau M.D. On: 07/20/2018 13:44  MR Lumbar Spine W Wo Contrast  Narrative CLINICAL  DATA:  Low back pain extending  into the right buttocks and leg. History of back surgery in 2014. No recent injury. Initial encounter.  EXAM: MRI LUMBAR SPINE WITHOUT AND WITH CONTRAST  TECHNIQUE: Multiplanar and multiecho pulse sequences of the lumbar spine were obtained without and with intravenous contrast.  CONTRAST:  3m MULTIHANCE GADOBENATE DIMEGLUMINE 529 MG/ML IV SOLN  COMPARISON:  MRI 07/08/2014.  Radiographs 04/26/2014.  FINDINGS: Segmentation: Conventional anatomy assumed, with the last open disc space designated L5-S1.  Alignment: Similar to the prior examination. There is a grade 1 retrolisthesis at L1-2. There is 6 mm of anterolisthesis at L5-S1 secondary to facet disease.  Bones: Status post laminectomy and PLIF from L2 through L5. The hardware appears unchanged. There are mildly progressive endplate degenerative changes at L5-S1.  Conus medullaris: Extends to the L2 level and appears normal. No abnormal intradural enhancement seen.  Paraspinal and other soft tissues: No acute paraspinal findings. There is stable fatty atrophy of the erector spinae musculature. There are stable chronic fluid collections within the laminectomy bed.  Disc levels:  Sagittal images demonstrate stable mild disc bulging at T11-12. The T12-L1 disc space level appears normal.  L1-2: Adjacent segment disease with annular disc bulging, a small central disc protrusion and grade 1 retrolisthesis. There is mild facet and ligamentous hypertrophy and mild mass effect on the thecal sac. Left foraminal narrowing appears mildly increased on the sagittal images. There is no evidence of right-sided nerve root encroachment.  L2-3:  Stable appearance status post laminectomy and PLIF.  L3-4: Stable appearance status post laminectomy and PLIF.  L4-5: Stable appearance status post laminectomy and PLIF.  L5-S1: Progressive annular disc bulging and endplate degeneration with associated vacuum phenomenon. There is  advanced bilateral facet hypertrophy with a resulting grade 1 anterolisthesis. Progressive narrowing of the lateral recesses and foramina, right-greater-than-left.  IMPRESSION: 1. The operative levels appear unchanged status post L2 through L5 laminectomy and PLIF. 2. Progressive adjacent segment disease at L5-S1 with increased endplate degeneration, grade 1 anterolisthesis and resulting right-greater-than-left foraminal and lateral recess narrowing. There is possible right-sided nerve root encroachment. 3. The adjacent segment disease at L1-2 also appears mildly progressive, contributing to mild narrowing of the left foramen. No right-sided nerve root encroachment identified at L1-2.   Electronically Signed By: WRichardean SaleM.D. On: 10/11/2015 16:44  CT LUMBAR SPINE WO CONTRAST  Narrative CLINICAL DATA:  Spinal stenosis, lumbar region, with neurogenic claudication  EXAM: CT THORACIC SPINE WITHOUT CONTRAST; CT LUMBAR SPINE WITHOUT CONTRAST  TECHNIQUE: Multidetector CT images of the thoracic were obtained using the standard protocol without intravenous contrast. Levels of coverage are from the T3-4 interspace through the mid sacrum.  COMPARISON:  Lumbar spine MRI 07/20/2018  FINDINGS: Alignment: Mild L1-2 retrolisthesis.  Fused L5-S1 anterolisthesis.  Vertebrae: L2-S1 PLIF with solid arthrodesis. No hardware complication.  No acute fracture.  No discitis or aggressive bone lesion  Paraspinal and other soft tissues: Lap band at the GE junction.  Disc levels: L1-2 severe adjacent segment degeneration with vacuum phenomenon within the disc space that shows endplate sclerosis and irregularity. Extruded calcified disc severely narrows the left L1-2 foramen with tracking superior to the left L2 transverse process in the paravertebral space. Calcified herniation is also seen extending inferiorly from the right subarticular recess into the right  L2-3 foramen.  Spondylosis and disc narrowing in the thoracic spine. There is a sizable herniation at T7-8 with presumed cord flattening. A smaller herniation with cord contact is present at T8-9.  There is a right paracentral to foraminal protrusion at T9-10 without suspected impingement.  Dorsal epidural gas at T12 and L1. Epidural gas at the right L2-3 foramen. An ESI was performed at this level July 2019. Question more recent ESI.  IMPRESSION: 1. L1-2 severe adjacent segment degeneration with canal and foraminal compression. Calcified disc extrusion fills the left L1-2 foramen and extends along the L2 left transverse process. Right paracentral inferiorly migrating calcified disc extrusion effaces the right subarticular recess and tracks out of the right L2-3 foramen. 2. T7-8 sizable disc protrusion with presumed cord flattening. There is a T8-9 herniation which contacts the cord. 3. L2-S1 PLIF with solid arthrodesis. 4. Epidural gas presumably from epidural injection.   Electronically Signed By: Monte Fantasia M.D. On: 08/07/2018 09:10  CT LUMBAR SPINE WO CONTRAST  Narrative CLINICAL DATA:  Acute lumbar radiculopathy.  Lumbar fusion.  EXAM: CT LUMBAR SPINE WITHOUT CONTRAST  TECHNIQUE: Multidetector CT imaging of the lumbar spine was performed without intravenous contrast administration. Multiplanar CT image reconstructions were also generated.  COMPARISON:  Lumbar spine radiographs 03/29/2016. Lumbar MRI 10/11/2015  FINDINGS: Segmentation: Normal  Alignment: Mild retrolisthesis L1-2 is similar to the prior MRI. Slight anterolisthesis at L4-5. 9 mm anterolisthesis L5-S1 has progressed since the prior MRI.  Vertebrae: Negative for fracture or mass lesion.  Paraspinal and other soft tissues: Paraspinous muscles are symmetric with generalized muscle atrophy. No fluid collection or mass. No retroperitoneal mass or adenopathy. Atherosclerotic calcification  in the aorta and iliacs without aneurysm.  Disc levels: T12-L1:  Negative  L1-2: Advanced disc degeneration with cystic changes in the endplates. Retrolisthesis with disc and facet degeneration and moderate spinal stenosis. Gas in the disc space.  L2-3: PLIF with solid fusion. Posterior decompression. Negative for stenosis  L3-4: PLIF with solid fusion. Posterior decompression without stenosis  L4-5: PLIF with solid fusion. Posterior decompression without stenosis.  L5-S1: 9 mm anterolisthesis. PLIF with solid interbody fusion. Hardware in satisfactory position without loosening.  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  PLIF L2 through S1 with solid fusion at L2-3, L3-4, L4-5, and L5-S1.   Electronically Signed By: Franchot Gallo M.D. On: 11/05/2016 16:48  DG Lumbar Spine 1 View  Narrative *RADIOLOGY REPORT*  Clinical Data: Level PLIF  LUMBAR SPINE - 1 VIEW  Comparison: Lumbar spine radiographs - 03/12/2013  Findings:  A single spot lateral radiographic images of the lumbar spine is provided for review. Examination is degraded secondary to underpenetration.  Lumbar spine labeling is in keeping with preprocedural lumbar spine radiographs.  Radiopaque surgical instruments are seen posterior to the L2 - L3 intervertebral disc space.  Additional radiopaque surgical support apparatus is seen about the operative site.  IMPRESSION: Intraoperative lumbar spine localization as above.   Original Report Authenticated By: Jake Seats, MD  DG Lumbar Spine 2-3 Views  Narrative CLINICAL DATA:  T10-L2 fusion  EXAM: LUMBAR SPINE - 2-3 VIEW; DG C-ARM 61-120 MIN  COMPARISON:  08/06/2018 CT  FINDINGS: Intraoperative spot films of the thoracolumbar spine are submitted postoperatively for interpretation.  Posterior rod and bipedicular screw fixation noted from T10-L1. The new posterior rods are clamped to the existing  lumbar spine rods.  Interbody fusion spacers are identified at L1-2.  IMPRESSION: Surgical fusion changes from T10-L2 as described.   Electronically Signed By: Margarette Canada M.D. On: 09/15/2018 19:33  DG Lumbar Spine Complete  Narrative CLINICAL DATA:  Severe back pain. History of lumbar fusion on 09/15/2018.  EXAM:  LUMBAR SPINE - COMPLETE 4+ VIEW  COMPARISON:  Intraoperative lumbar spine radiographs-09/15/2018; lumbar spine CT-08/06/2018  FINDINGS: Stable sequela of cranial extension of previously noted paraspinal lumbar fusion hardware. Paraspinal fusion hardware is now extending from T10 through S1 with bilateral pedicular screws seen at all levels.  Post L1-L2 intervertebral disc space replacement with restoration of the L1-L2 intervertebral disc space height. Stable sequela of L2-L3, L3-L4, L4-L5 and L5-S1 intervertebral disc space replacement.  Cement material is noted about the T10, T11, T12 and L1 pedicular screws. A small amount of cement is seen within one of the T11 paraspinal lumbar veins.  Limited visualization the bilateral SI joints is normal.  Scattered calcifications within the abdominal aorta. Post cholecystectomy. Sequela of prior gastric lap band procedure.  IMPRESSION: Post cranial extension of now long segment T10-L1 paraspinal fusion and multilevel cement augmentation and intervertebral disc space replacement without definite evidence of hardware failure or loosening. If concern persists, further evaluation with MRI could be performed as indicated.   Electronically Signed By: Sandi Mariscal M.D. On: 09/30/2018 18:36  DG Lumbar Spine Complete W/Bend  Narrative CLINICAL DATA:  Chronic low back pain.  EXAM: LUMBAR SPINE - COMPLETE WITH BENDING VIEWS  COMPARISON:  December 10, 2018.  FINDINGS: Status post surgical posterior fusion extending from T10 to S1 with bilateral intrapedicular screw placement. Interbody fusion is noted extending  from L1-2 to L5-S1. No fracture or spondylolisthesis is noted. No change in vertebral body alignment is noted on flexion or extension views.  IMPRESSION: Postsurgical changes as described above. No acute abnormality seen in the lumbar spine. No change in vertebral body alignment is noted on flexion or extension views.   Electronically Signed By: Marijo Conception M.D. On: 04/21/2020 08:37        DG Epidurography  Narrative *RADIOLOGY REPORT*  EPIDUROGRAM S+I  Clinical Data:   Displacement of lumbar intervertebral disc without myelopathy.  Right leg radicular symptoms worse significantly improved for several weeks but now recurred.  There is moderate multifactorial spinal stenosis at L4-5 which has responded to prior injections.  Procedure: Right L4-L5 Interlaminar Epidural Steroid Injection  Fully informed written consent was obtained on the first visit. The patient was placed prone on the fluoroscopic table, the L4-L5 interspace was visualized, and the skin was marked.  After thorough povidone-iodine scrubbing, the site was draped in sterile fashion. 1% lidocaine was used to anesthetize the skin and deeper soft tissues.  A 20-gauge Crawford epidural needle was inserted and advanced into the epidural space using "loss of resistance" technique with normal saline.  The epidural space was identified on first pass without evidence of blood, cerebrospinal fluid, or paresthesias.  Needle tip placement within the epidural space was confirmed using 1 mL of non-ionic contrast.  There was no intravascular or subarachnoid opacification.  Next, '120mg'$  of Depo- Medrol was administered and flushed using 2 mL of normal saline. The needle was restyletted and withdrawn.  The patient tolerated the procedure well and there was no evidence of procedural complication.  The patient was able to ambulate to the recovery area and observed for an appropriate length of time as outlined on the  nursing notes. The patient was discharged home with a driver in good condition with detailed patient instructions.  Fluoroscopy time:  15 seconds  IMPRESSION:  Satisfactory interlaminar lumbar epidural steroid injection,  right L4-L5.   Original Report Authenticated By: Rolla Flatten, M.D.  DG Epidurography  Narrative *RADIOLOGY REPORT*  Clinical Data: Lumbosacral spondylosis without  myelopathy. Excellent relief with prior L4-L5 interlaminar epidural steroid injection.  Recurrent low back pain. L4-L5 severe facet arthritis with anterolisthesis.  Lumbar disc displacement.  L4-L5 spinal stenosis.  LUMBAR INTERLAMINAR EPIDURAL INJECTION  Procedure: After a thorough discussion of risks and benefits of the procedure, written and verbal consent was obtained.  Specific risks included puncture of the thecal sac and dura as well as nontherapeutic injection with general risks of bleeding, infection, injury to nerves, blood vessels, and adjacent structures. Time out form was completed.  Verbal consent was obtained by Dr. Gerilyn Pilgrim.   We discussed the moderate likelihood of moderate lasting relief/attainment of therapeutic goal.   The overlying skin was cleansed with betadine soap and anesthetized with 1% lidocaine without epinephrine. An interlaminar approach was performed at L4- L5 on the right. 3-1/2 inches 20 gauge needle was advanced using loss-of-resistance technique.  DIAGNOSTIC/THERAPUETIC EPIDURAL INJECTION:  Injection of Omnipaque 180 shows a good epidural pattern with spread above and below the level of needle placement, primarily on the side of needle placement.  No vascular or subarachnoid  opacification was  seen. 120 mg of Depo-Medrol mixed with 5 cc of 1% Lidocaine were instilled.  The procedure was well-tolerated, and the patient was discharged thirty minutes following the injection in good condition.  Fluoroscopy Time:  21 seconds  IMPRESSION:  Technically  successful second lumbar interlaminar epidural injection at right L4-L5.   Original Report Authenticated By: Dereck Ligas, M.D.  DG Epidurography  Narrative *RADIOLOGY REPORT*  EPIDUROGRAM S+I  Clinical Data:   Displacement of lumbar intervertebral disc without myelopathy.  Overall improved with recent worsening.  Procedure: Right L4-L5 Interlaminar Epidural Steroid Injection  Fully informed written consent was obtained on the first visit. The patient was placed prone on the fluoroscopic table, the  L4-L5 interspace was visualized, and the skin was marked.  After thorough povidone-iodine scrubbing, the site was draped in sterile fashion. 1% lidocaine was used to anesthetize the skin and deeper soft tissues.  A 20-gauge Crawford epidural needle was inserted and advanced into the epidural space using "loss of resistance" technique with normal saline.  The epidural space was identified on first pass without evidence of blood, cerebrospinal fluid, or paresthesias.  Needle tip placement within the epidural space was confirmed using 1 mL of non-ionic contrast.  There was no intravascular or subarachnoid opacification.  Next, '120mg'$  of Depo- Medrol was administered and flushed using 2 mL of normal saline. The needle was restyletted and withdrawn.  The patient tolerated the procedure well and there was no evidence of procedural complication.  The patient was able to ambulate to the recovery area and observed for an appropriate length of time as outlined on the nursing notes. The patient was discharged home with a driver in good condition with detailed patient instructions.  Fluoroscopy time:  10 seconds  IMPRESSION:  Satisfactory interlaminar lumbar epidural steroid injection, right L4-L5.  Original Report Authenticated By: Staci Righter, M.D.  DG Epidurography  Narrative *RADIOLOGY REPORT*  Clinical Data: Lumbosacral spondylosis without myelopathy. Previous  successful interlaminar epidural steroid injection.  The pain has shifted somewhat and is now low back and radiating to the right inguinal region. MRI demonstrates right-sided disc protrusion at L3-L4.  LUMBAR INTERLAMINAR EPIDURAL INJECTION  Procedure: After a thorough discussion of risks and benefits of the procedure, written and verbal consent was obtained.  Specific risks included puncture of the thecal sac and dura as well as nontherapeutic injection with general risks of bleeding, infection, injury to  nerves, blood vessels, and adjacent structures.  Verbal consent was obtained by Dr. Gerilyn Pilgrim. An interlaminar approach was performed at L3-L4.  We discussed the moderate likelihood of moderate lasting relief/attinment of therapuetic goal.   The overlying skin was cleansed with betadine soap and anesthetized with 1% lidocaine without epinephrine.  3-1/2 inches 20 gauge needle was advanced using loss-of-resistance technique.  DIAGNOSTIC/THERAPUETIC EPIDURAL INJECTION:  Injection of Omnipaque 180 shows a good epidural pattern with spread above and below the level of needle placement, primarily on the side of needle placement.  No vascular or subarachnoid  opacification was  seen. 120 mg of Depo-Medrol mixed with 5 cc of 1% Lidocaine were instilled.  The procedure was well-tolerated, and the patient was discharged thirty minutes following the injection in good condition.  Fluoroscopy Time:  21 seconds  IMPRESSION:  Technically successful second lumbar interlaminar epidural injection at right L3-L4.  Original Report Authenticated By: Dereck Ligas, M.D.  DG Epidurography  Narrative *RADIOLOGY REPORT*  Clinical Data: Lumbosacral spondylosis without myelopathy.  Back pain.  Excellent relief with prior epidural steroid injections. Recurrent pain.  Lumbar disc displacement.  Lumbar facet arthritis.  LUMBAR INTERLAMINAR EPIDURAL INJECTION  Procedure: After a thorough discussion  of risks and benefits of the procedure, written and verbal consent was obtained.  Specific risks included puncture of the thecal sac and dura as well as nontherapeutic injection with general risks of bleeding, infection, injury to nerves, blood vessels, and adjacent structures.  Verbal consent was obtained by Dr. Gerilyn Pilgrim. An interlaminar approach was performed at L4-L5 on the right.  We discussed the moderate likelihood of moderate lasting relief/attinment of therapuetic goal.   The overlying skin was cleansed with betadine soap and anesthetized with 1% lidocaine without epinephrine.  3-1/2 inches 20 gauge needle was advanced using loss-of-resistance technique.  DIAGNOSTIC/THERAPUETIC EPIDURAL INJECTION:  Injection of Omnipaque 180 shows a good epidural pattern with spread above and below the level of needle placement, primarily on the side of needle placement.  No vascular or subarachnoid  opacification was  seen. 120 mg of Depo-Medrol mixed with 5 cc of 1% Lidocaine were instilled.  The procedure was well-tolerated, and the patient was discharged thirty minutes following the injection in good condition.  Fluoroscopy Time:  30 seconds  IMPRESSION:  Technically successful first lumbar interlaminar epidural injection at right L4-L5.  Original Report Authenticated By: Dereck Ligas, M.D.  DG Epidurography  Narrative *RADIOLOGY REPORT*  EPIDUROGRAM S+I  Clinical Data:   Displacement of lumbar intervertebral disc without myelopathy.  Marked improvement after previous injections.  I repeated it.  Procedure: Right L4-L5 Interlaminar Epidural Steroid Injection  Fully informed written consent was obtained on the first visit. The patient was placed prone on the fluoroscopic table,  the L4-L5 interspace was visualized, and the skin was marked.  After thorough povidone-iodine scrubbing, the site was draped in sterile fashion. 1% lidocaine was used to anesthetize the skin and deeper  soft tissues.  A 20-gauge Crawford epidural needle was inserted and advanced into the epidural space using "loss of resistance" technique with normal saline.  The epidural space was identified on first pass without evidence of blood, cerebrospinal fluid, or paresthesias.  Needle tip placement within the epidural space was confirmed using 1 mL of non-ionic contrast.  There was no intravascular or subarachnoid opacification.  Next, '120mg'$  of Depo- Medrol was administered and flushed using 2 mL of normal saline. The needle was restyletted and withdrawn.  The patient tolerated the procedure well and there was  no evidence of procedural complication.  The patient was able to ambulate to the recovery area and observed for an appropriate length of time as outlined on the nursing notes. The patient was discharged home with a driver in good condition with detailed patient instructions.  Fluoroscopy time:  14 seconds  IMPRESSION:  Satisfactory interlaminar lumbar epidural steroid injection,  right L4-L5.  Original Report Authenticated By: Staci Righter, M.D.  Spine Imaging: DG Epidurography  Narrative *RADIOLOGY REPORT*  EPIDUROGRAM S+I  Clinical Data:   Displacement of lumbar intervertebral disc without myelopathy.  Right leg radicular symptoms worse significantly improved for several weeks but now recurred.  There is moderate multifactorial spinal stenosis at L4-5 which has responded to prior injections.  Procedure: Right L4-L5 Interlaminar Epidural Steroid Injection  Fully informed written consent was obtained on the first visit. The patient was placed prone on the fluoroscopic table, the L4-L5 interspace was visualized, and the skin was marked.  After thorough povidone-iodine scrubbing, the site was draped in sterile fashion. 1% lidocaine was used to anesthetize the skin and deeper soft tissues.  A 20-gauge Crawford epidural needle was inserted and advanced into the epidural  space using "loss of resistance" technique with normal saline.  The epidural space was identified on first pass without evidence of blood, cerebrospinal fluid, or paresthesias.  Needle tip placement within the epidural space was confirmed using 1 mL of non-ionic contrast.  There was no intravascular or subarachnoid opacification.  Next, '120mg'$  of Depo- Medrol was administered and flushed using 2 mL of normal saline. The needle was restyletted and withdrawn.  The patient tolerated the procedure well and there was no evidence of procedural complication.  The patient was able to ambulate to the recovery area and observed for an appropriate length of time as outlined on the nursing notes. The patient was discharged home with a driver in good condition with detailed patient instructions.  Fluoroscopy time:  15 seconds  IMPRESSION:  Satisfactory interlaminar lumbar epidural steroid injection,  right L4-L5.   Original Report Authenticated By: Rolla Flatten, M.D.  DG Epidurography  Narrative *RADIOLOGY REPORT*  Clinical Data: Lumbosacral spondylosis without myelopathy. Excellent relief with prior L4-L5 interlaminar epidural steroid injection.  Recurrent low back pain. L4-L5 severe facet arthritis with anterolisthesis.  Lumbar disc displacement.  L4-L5 spinal stenosis.  LUMBAR INTERLAMINAR EPIDURAL INJECTION  Procedure: After a thorough discussion of risks and benefits of the procedure, written and verbal consent was obtained.  Specific risks included puncture of the thecal sac and dura as well as nontherapeutic injection with general risks of bleeding, infection, injury to nerves, blood vessels, and adjacent structures. Time out form was completed.  Verbal consent was obtained by Dr. Gerilyn Pilgrim.   We discussed the moderate likelihood of moderate lasting relief/attainment of therapeutic goal.   The overlying skin was cleansed with betadine soap and anesthetized with 1%  lidocaine without epinephrine. An interlaminar approach was performed at L4- L5 on the right. 3-1/2 inches 20 gauge needle was advanced using loss-of-resistance technique.  DIAGNOSTIC/THERAPUETIC EPIDURAL INJECTION:  Injection of Omnipaque 180 shows a good epidural pattern with spread above and below the level of needle placement, primarily on the side of needle placement.  No vascular or subarachnoid  opacification was  seen. 120 mg of Depo-Medrol mixed with 5 cc of 1% Lidocaine were instilled.  The procedure was well-tolerated, and the patient was discharged thirty minutes following the injection in good condition.  Fluoroscopy Time:  21 seconds  IMPRESSION:  Technically successful  second lumbar interlaminar epidural injection at right L4-L5.   Original Report Authenticated By: Dereck Ligas, M.D.  DG Epidurography  Narrative *RADIOLOGY REPORT*  EPIDUROGRAM S+I  Clinical Data:   Displacement of lumbar intervertebral disc without myelopathy.  Overall improved with recent worsening.  Procedure: Right L4-L5 Interlaminar Epidural Steroid Injection  Fully informed written consent was obtained on the first visit. The patient was placed prone on the fluoroscopic table, the  L4-L5 interspace was visualized, and the skin was marked.  After thorough povidone-iodine scrubbing, the site was draped in sterile fashion. 1% lidocaine was used to anesthetize the skin and deeper soft tissues.  A 20-gauge Crawford epidural needle was inserted and advanced into the epidural space using "loss of resistance" technique with normal saline.  The epidural space was identified on first pass without evidence of blood, cerebrospinal fluid, or paresthesias.  Needle tip placement within the epidural space was confirmed using 1 mL of non-ionic contrast.  There was no intravascular or subarachnoid opacification.  Next, '120mg'$  of Depo- Medrol was administered and flushed using 2 mL of normal  saline. The needle was restyletted and withdrawn.  The patient tolerated the procedure well and there was no evidence of procedural complication.  The patient was able to ambulate to the recovery area and observed for an appropriate length of time as outlined on the nursing notes. The patient was discharged home with a driver in good condition with detailed patient instructions.  Fluoroscopy time:  10 seconds  IMPRESSION:  Satisfactory interlaminar lumbar epidural steroid injection, right L4-L5.  Original Report Authenticated By: Staci Righter, M.D.  DG Epidurography  Narrative *RADIOLOGY REPORT*  Clinical Data: Lumbosacral spondylosis without myelopathy. Previous successful interlaminar epidural steroid injection.  The pain has shifted somewhat and is now low back and radiating to the right inguinal region. MRI demonstrates right-sided disc protrusion at L3-L4.  LUMBAR INTERLAMINAR EPIDURAL INJECTION  Procedure: After a thorough discussion of risks and benefits of the procedure, written and verbal consent was obtained.  Specific risks included puncture of the thecal sac and dura as well as nontherapeutic injection with general risks of bleeding, infection, injury to nerves, blood vessels, and adjacent structures.  Verbal consent was obtained by Dr. Gerilyn Pilgrim. An interlaminar approach was performed at L3-L4.  We discussed the moderate likelihood of moderate lasting relief/attinment of therapuetic goal.   The overlying skin was cleansed with betadine soap and anesthetized with 1% lidocaine without epinephrine.  3-1/2 inches 20 gauge needle was advanced using loss-of-resistance technique.  DIAGNOSTIC/THERAPUETIC EPIDURAL INJECTION:  Injection of Omnipaque 180 shows a good epidural pattern with spread above and below the level of needle placement, primarily on the side of needle placement.  No vascular or subarachnoid  opacification was  seen. 120 mg of Depo-Medrol mixed  with 5 cc of 1% Lidocaine were instilled.  The procedure was well-tolerated, and the patient was discharged thirty minutes following the injection in good condition.  Fluoroscopy Time:  21 seconds  IMPRESSION:  Technically successful second lumbar interlaminar epidural injection at right L3-L4.  Original Report Authenticated By: Dereck Ligas, M.D.  DG Epidurography  Narrative *RADIOLOGY REPORT*  Clinical Data: Lumbosacral spondylosis without myelopathy.  Back pain.  Excellent relief with prior epidural steroid injections. Recurrent pain.  Lumbar disc displacement.  Lumbar facet arthritis.  LUMBAR INTERLAMINAR EPIDURAL INJECTION  Procedure: After a thorough discussion of risks and benefits of the procedure, written and verbal consent was obtained.  Specific risks included puncture of the thecal sac and dura  as well as nontherapeutic injection with general risks of bleeding, infection, injury to nerves, blood vessels, and adjacent structures.  Verbal consent was obtained by Dr. Gerilyn Pilgrim. An interlaminar approach was performed at L4-L5 on the right.  We discussed the moderate likelihood of moderate lasting relief/attinment of therapuetic goal.   The overlying skin was cleansed with betadine soap and anesthetized with 1% lidocaine without epinephrine.  3-1/2 inches 20 gauge needle was advanced using loss-of-resistance technique.  DIAGNOSTIC/THERAPUETIC EPIDURAL INJECTION:  Injection of Omnipaque 180 shows a good epidural pattern with spread above and below the level of needle placement, primarily on the side of needle placement.  No vascular or subarachnoid  opacification was  seen. 120 mg of Depo-Medrol mixed with 5 cc of 1% Lidocaine were instilled.  The procedure was well-tolerated, and the patient was discharged thirty minutes following the injection in good condition.  Fluoroscopy Time:  30 seconds  IMPRESSION:  Technically successful first lumbar interlaminar  epidural injection at right L4-L5.  Original Report Authenticated By: Dereck Ligas, M.D.  DG Epidurography  Narrative *RADIOLOGY REPORT*  EPIDUROGRAM S+I  Clinical Data:   Displacement of lumbar intervertebral disc without myelopathy.  Marked improvement after previous injections.  I repeated it.  Procedure: Right L4-L5 Interlaminar Epidural Steroid Injection  Fully informed written consent was obtained on the first visit. The patient was placed prone on the fluoroscopic table,  the L4-L5 interspace was visualized, and the skin was marked.  After thorough povidone-iodine scrubbing, the site was draped in sterile fashion. 1% lidocaine was used to anesthetize the skin and deeper soft tissues.  A 20-gauge Crawford epidural needle was inserted and advanced into the epidural space using "loss of resistance" technique with normal saline.  The epidural space was identified on first pass without evidence of blood, cerebrospinal fluid, or paresthesias.  Needle tip placement within the epidural space was confirmed using 1 mL of non-ionic contrast.  There was no intravascular or subarachnoid opacification.  Next, '120mg'$  of Depo- Medrol was administered and flushed using 2 mL of normal saline. The needle was restyletted and withdrawn.  The patient tolerated the procedure well and there was no evidence of procedural complication.  The patient was able to ambulate to the recovery area and observed for an appropriate length of time as outlined on the nursing notes. The patient was discharged home with a driver in good condition with detailed patient instructions.  Fluoroscopy time:  14 seconds  IMPRESSION:  Satisfactory interlaminar lumbar epidural steroid injection,  right L4-L5.  Original Report Authenticated By: Staci Righter, M.D.  DG Epidurography  Narrative *RADIOLOGY REPORT*  CLINICAL DATA:  Lumbosacral spondylosis without myelopathy. Displacement of the L4-5 lumbar  disc.  The patient had near 100 percent relief of pain following previous epidural injection.  The pain has since recurred it is now 6/10.  LUMBAR EPIDURAL INJECTION: An interlaminar approach was performed on the right at L4-5.  The overlying skin was cleansed and anesthetized.  A 20 gauge spinal needle was advanced using loss-of-resistance technique.  Injection of 2cc of Omnipaque 180 confirmed epidural placement.  There was no evidence for intravascular or intrathecal spread of contrast.  I then injected 120 mg of Depo-Medrol and 28m of 1% lidocaine.  The patient tolerated the procedure without evidence for complication. The patient  was observed for 20 minutes prior to discharge in stable neurologic condition.  FLUORO TIME:  19 seconds  IMPRESSIONS:  Technically successful second interlaminar epidural steroid injection on the right at L4-5.  Original Report Authenticated By: Resa Miner. MATTERN, M.D.  DG Epidurography  Narrative CLINICAL DATA:  Lumbosacral spondylosis without myelopathy.  Right lower extremity radiculitis.  Spondylolisthesis at L4-5.  LUMBAR EPIDURAL INJECTION: An interlaminar approach was performed on the right at L4-5.  The overlying skin was cleansed and anesthetized.  A 20 gauge spinal needle was advanced using loss-of-resistance technique.  Injection of 2cc of Omnipaque 180 confirmed epidural placement.  There was no evidence for intravascular or intrathecal spread of contrast.  I then injected 120 mg of Depo-Medrol and 93m of 1% lidocaine.  The patient tolerated the procedure without evidence for complication. The patient  was observed for 20 minutes prior to discharge in stable neurologic condition.  FLUORO TIME:  18 seconds  IMPRESSIONS:  Technically successful first interlaminar epidural steroid injection on the right at L4-5.  Provider: SDawayne Cirri Complexity Note: Imaging results reviewed. Results shared with Ms. SJenne Campus using  Layman's terms.                        Meds   Current Outpatient Medications:  .  Cholecalciferol (VITAMIN D3) 2000 units TABS, Take 2,000 Units by mouth daily., Disp: , Rfl:  .  fexofenadine (ALLEGRA) 180 MG tablet, Take 1 tablet (180 mg total) by mouth daily., Disp: 90 tablet, Rfl: 4 .  fluticasone (FLONASE) 50 MCG/ACT nasal spray, SPRAY 2 SPRAYS INTO EACH NOSTRIL EVERY DAY (Patient taking differently: Place 2 sprays into both nostrils at bedtime. ), Disp: 48 mL, Rfl: 1 .  losartan-hydrochlorothiazide (HYZAAR) 50-12.5 MG tablet, TAKE 1 TABLET BY MOUTH EVERY DAY (Patient taking differently: Take 1 tablet by mouth daily. ), Disp: 90 tablet, Rfl: 2 .  Magnesium Oxide 500 MG CAPS, Take 1 capsule (500 mg total) by mouth 2 (two) times daily at 8 am and 10 pm., Disp: 120 capsule, Rfl: 0 .  metoprolol succinate (TOPROL-XL) 25 MG 24 hr tablet, TAKE 1/2 TABLET BY MOUTH EVERY DAY, Disp: 45 tablet, Rfl: 1 .  Multiple Vitamin (MULTIVITAMIN) tablet, Take 1 tablet by mouth daily., Disp: , Rfl:  .  ONETOUCH VERIO test strip, USE TO CHECK BLOOD SUGAR TWICE A DAY, Disp: 100 strip, Rfl: 12 .  pantoprazole (PROTONIX) 40 MG tablet, TAKE 1 TABLET (40 MG TOTAL) BY MOUTH 2 (TWO) TIMES DAILY BEFORE A MEAL., Disp: 180 tablet, Rfl: 3 .  sertraline (ZOLOFT) 100 MG tablet, TAKE 1/2 TABLET BY MOUTH (50 MG) BY MOUTH DAILY, Disp: 45 tablet, Rfl: 1 .  simvastatin (ZOCOR) 10 MG tablet, TAKE 1/2 TABLET (5 MG TOTAL) BY MOUTH AT BEDTIME., Disp: 45 tablet, Rfl: 3 .  tiZANidine (ZANAFLEX) 2 MG tablet, Take 1 tablet (2 mg total) by mouth 2 (two) times daily., Disp: 60 tablet, Rfl: 1 .  traMADol (ULTRAM) 50 MG tablet, Take 1 tablet (50 mg total) by mouth 2 (two) times daily., Disp: 60 tablet, Rfl: 1 .  traZODone (DESYREL) 100 MG tablet, TAKE 1 TABLET BY MOUTH EVERYDAY AT BEDTIME, Disp: 90 tablet, Rfl: 1 .  vitamin B-12 (CYANOCOBALAMIN) 1000 MCG tablet, Take 1,000 mcg by mouth daily., Disp: , Rfl:  .  metFORMIN (GLUCOPHAGE) 500 MG  tablet, TAKE 1 TABLET BY MOUTH EVERY DAY, Disp: 90 tablet, Rfl: 1  ROS  Constitutional: Denies any fever or chills Gastrointestinal: No reported hemesis, hematochezia, vomiting, or acute GI distress Musculoskeletal: Denies any acute onset joint swelling, redness, loss of ROM, or weakness Neurological: No reported episodes of acute onset apraxia, aphasia, dysarthria, agnosia,  amnesia, paralysis, loss of coordination, or loss of consciousness  Allergies  Ms. Lordi is allergic to adhesive [tape].  PFSH  Drug: Ms. Reigel  reports no history of drug use. Alcohol:  reports current alcohol use. Tobacco:  reports that she has never smoked. She has never used smokeless tobacco. Medical:  has a past medical history of Allergy, Anxiety, Arthritis, Chronic back pain, Diabetes mellitus, Dyspnea, GERD (gastroesophageal reflux disease), History of hiatal hernia, Hyperlipidemia, Hypertension, Osteoarthritis, Pneumonia, Spinal stenosis, and UTI (lower urinary tract infection). Surgical: Ms. Herrero  has a past surgical history that includes Cholecystectomy (1995); Appendectomy; Abdominal hysterectomy (1973); Tonsillectomy and adenoidectomy (1979); lap band (08/2003); Hernia repair (08/2006); Total hip arthroplasty (02/2012); Septoplasty (04/1978); Abdominal hysterectomy; Back surgery (04/17/13, 2017); Application of robotic assistance for spinal procedure (N/A, 09/15/2018); Total hip arthroplasty (Left, 08/31/2019); Cataract extraction w/PHACO (Left, 12/16/2019); and Cataract extraction w/PHACO (Right, 01/06/2020). Family: family history includes Arthritis in her sister; Breast cancer (age of onset: 66) in an other family member; Cancer in her sister and another family member; Dementia in her sister; Diabetes in her brother, brother, sister, and another family member; Heart disease in her father and mother; Hypertension in her brother, brother, father, and mother; Stroke in her father and mother.  Constitutional Exam   General appearance: Well nourished, well developed, and well hydrated. In no apparent acute distress Vitals:   05/16/20 1121  BP: 122/67  Pulse: (!) 56  Resp: 16  Temp: 97.9 F (36.6 C)  SpO2: 99%  Weight: 175 lb (79.4 kg)  Height: '5\' 5"'$  (1.651 m)   BMI Assessment: Estimated body mass index is 29.12 kg/m as calculated from the following:   Height as of this encounter: '5\' 5"'$  (1.651 m).   Weight as of this encounter: 175 lb (79.4 kg).  BMI interpretation table: BMI level Category Range association with higher incidence of chronic pain  <18 kg/m2 Underweight   18.5-24.9 kg/m2 Ideal body weight   25-29.9 kg/m2 Overweight Increased incidence by 20%  30-34.9 kg/m2 Obese (Class I) Increased incidence by 68%  35-39.9 kg/m2 Severe obesity (Class II) Increased incidence by 136%  >40 kg/m2 Extreme obesity (Class III) Increased incidence by 254%   Patient's current BMI Ideal Body weight  Body mass index is 29.12 kg/m. Ideal body weight: 57 kg (125 lb 10.6 oz) Adjusted ideal body weight: 66 kg (145 lb 6.4 oz)   BMI Readings from Last 4 Encounters:  05/16/20 29.12 kg/m  04/21/20 29.29 kg/m  04/20/20 29.12 kg/m  02/25/20 29.45 kg/m   Wt Readings from Last 4 Encounters:  05/16/20 175 lb (79.4 kg)  04/21/20 176 lb (79.8 kg)  04/20/20 175 lb (79.4 kg)  02/25/20 177 lb (80.3 kg)    Psych/Mental status: Alert, oriented x 3 (person, place, & time)       Eyes: PERLA Respiratory: No evidence of acute respiratory distress  Assessment & Plan  Primary Diagnosis & Pertinent Problem List: The primary encounter diagnosis was Chronic pain syndrome. Diagnoses of Chronic low back pain (1ry area of Pain) (Bilateral) (R>L) w/o sciatica, Failed back surgical syndrome (x3), Spasm of thoracic back muscle, Myofascial pain syndrome of thoracic spine, Pharmacologic therapy, Uncomplicated opioid dependence ([place]), and Opioid use (10 MME/day) were also pertinent to this visit.  Visit Diagnosis: 1.  Chronic pain syndrome   2. Chronic low back pain (1ry area of Pain) (Bilateral) (R>L) w/o sciatica   3. Failed back surgical syndrome (x3)   4. Spasm of thoracic back muscle  5. Myofascial pain syndrome of thoracic spine   6. Pharmacologic therapy   7. Uncomplicated opioid dependence ([place])   8. Opioid use (10 MME/day)    Problems updated and reviewed during this visit: Problem  History of total hip replacement (Left)  Spondylolisthesis At L5-S1 Level   Formatting of this note might be different from the original. Last Assessment & Plan:  Was seeing Dr Ellene Route.  Now just seeing on an as needed basis.  On tramadol.  Request to have refilled here.  No longer taking narcotic medications.   Chronic shoulder pain (Bilateral)  Uncomplicated Opioid Dependence ([facility])  Opioid use (10 MME/day)   05/16/2020 - evaluation by Dr. Milinda Pointer ([facility]) (Board-certified pain specialist).  Medication: Tramadol 50 mg, 1 Tab PO BID. Indication: Extensive thoracolumbar fusion with hardware, including bilateral multilevel pedicle screws. Risk level: Low based on ORT (opioid risk tool) results. Unannounced 04/20/2020 UDS: Within normal limits.  Patient is compliant with treatment. Side effects and/or adverse reactions: None reported.  Impression: Adequate medication, dose, and schedule.  Medication is indicated for this patient's condition. Recommendation: Continue as is.  Avoid increasing dose or frequency.  Follow CDC guidelines.  If tolerance develops, treat with "Drug Holiday".  Avoid the use of "opioid rotation" or escalation as a treatment for tolerance.   Hypokalemia    Plan of Care  Pharmacotherapy (Medications Ordered): Meds ordered this encounter  Medications  . Magnesium Oxide 500 MG CAPS    Sig: Take 1 capsule (500 mg total) by mouth 2 (two) times daily at 8 am and 10 pm.    Dispense:  120 capsule    Refill:  0    Fill one day early if pharmacy is closed on  scheduled refill date. May substitute for generic if available.  Marland Kitchen tiZANidine (ZANAFLEX) 2 MG tablet    Sig: Take 1 tablet (2 mg total) by mouth 2 (two) times daily.    Dispense:  60 tablet    Refill:  1    Fill one day early if pharmacy is closed on scheduled refill date. May substitute for generic, or similar, if available.  . traMADol (ULTRAM) 50 MG tablet    Sig: Take 1 tablet (50 mg total) by mouth 2 (two) times daily.    Dispense:  60 tablet    Refill:  1    Chronic Pain: STOP Act (Not applicable) Fill 1 day early if closed on refill date. Do not fill until: 05/16/2020. To last until: 07/15/2020. Avoid benzodiazepines within 8 hours of opioids   Procedure Orders    No procedure(s) ordered today   Lab Orders  No laboratory test(s) ordered today   Imaging Orders  No imaging studies ordered today   Referral Orders  No referral(s) requested today   Pharmacological management options:  Opioid Analgesics: The patient seems to be an appropriate candidate for the long-term use of tramadol 50 mg p.o. twice daily.  Avoid escalation.  Treat tolerance by using 14-day drug holidays. Membrane stabilizer: Consider low-dose trial of gabapentin at bedtime. Muscle relaxant: Continue with the tizanidine in addition to the magnesium. NSAID: This tends to not be very effective for the myofascial pain.  May use some over-the-counter, if needed. Other analgesic(s): None prescribed at this time    Management options: Recommendations:    Continue the patient on tizanidine (Zanaflex) 2 mg p.o. twice daily  Continue the patient on tramadol (Ultram) 50 mg p.o. twice daily  Continue on magnesium 500 mg  1 tablet p.o. twice daily  Consider a trial of membrane stabilizer such as gabapentin or Lyrica.   Interventional management options: Considering:   Paravertebral muscle Botox injections    PRN Procedures:   None at this time    Provider-requested follow-up: Return if symptoms worsen or fail to  improve. Recent Visits Date Type Provider Dept  04/20/20 Office Visit Milinda Pointer, MD [facility]  Showing recent visits within past 90 days and meeting all other requirements Today's Visits Date Type Provider Dept  05/16/20 Office Visit Milinda Pointer, MD [facility]  Showing today's visits and meeting all other requirements Future Appointments No visits were found meeting these conditions. Showing future appointments within next 90 days and meeting all other requirements  Primary Care Physician: [name], MD Note by: Gaspar Cola, MD Date: 05/16/2020; Time: 6:36 PM

## 2020-05-16 ENCOUNTER — Encounter: Payer: Self-pay | Admitting: Pain Medicine

## 2020-05-16 ENCOUNTER — Other Ambulatory Visit: Payer: Self-pay

## 2020-05-16 ENCOUNTER — Ambulatory Visit: Payer: Medicare HMO | Attending: Pain Medicine | Admitting: Pain Medicine

## 2020-05-16 VITALS — BP 122/67 | HR 56 | Temp 97.9°F | Resp 16 | Ht 65.0 in | Wt 175.0 lb

## 2020-05-16 DIAGNOSIS — M7918 Myalgia, other site: Secondary | ICD-10-CM | POA: Insufficient documentation

## 2020-05-16 DIAGNOSIS — M62830 Muscle spasm of back: Secondary | ICD-10-CM | POA: Insufficient documentation

## 2020-05-16 DIAGNOSIS — Z79899 Other long term (current) drug therapy: Secondary | ICD-10-CM | POA: Insufficient documentation

## 2020-05-16 DIAGNOSIS — M545 Low back pain, unspecified: Secondary | ICD-10-CM

## 2020-05-16 DIAGNOSIS — G894 Chronic pain syndrome: Secondary | ICD-10-CM | POA: Insufficient documentation

## 2020-05-16 DIAGNOSIS — R69 Illness, unspecified: Secondary | ICD-10-CM | POA: Diagnosis not present

## 2020-05-16 DIAGNOSIS — G8929 Other chronic pain: Secondary | ICD-10-CM | POA: Insufficient documentation

## 2020-05-16 DIAGNOSIS — F1120 Opioid dependence, uncomplicated: Secondary | ICD-10-CM | POA: Diagnosis present

## 2020-05-16 DIAGNOSIS — M961 Postlaminectomy syndrome, not elsewhere classified: Secondary | ICD-10-CM | POA: Diagnosis present

## 2020-05-16 DIAGNOSIS — F1190 Opioid use, unspecified, uncomplicated: Secondary | ICD-10-CM | POA: Insufficient documentation

## 2020-05-16 MED ORDER — TIZANIDINE HCL 2 MG PO TABS: 2.0000 mg | ORAL_TABLET | Freq: Two times a day (BID) | ORAL | 1 refills | Status: DC

## 2020-05-16 MED ORDER — MAGNESIUM OXIDE -MG SUPPLEMENT 500 MG PO CAPS: 1.0000 | ORAL_CAPSULE | Freq: Two times a day (BID) | ORAL | 0 refills | Status: DC

## 2020-05-16 MED ORDER — TRAMADOL HCL 50 MG PO TABS: 50.0000 mg | ORAL_TABLET | Freq: Two times a day (BID) | ORAL | 1 refills | Status: DC

## 2020-05-16 NOTE — Progress Notes (Signed)
Safety precautions to be maintained throughout the outpatient stay will include: orient to surroundings, keep bed in low position, maintain call bell within reach at all times, provide assistance with transfer out of bed and ambulation.  

## 2020-05-16 NOTE — Patient Instructions (Addendum)
____________________________________________________________________________________________  Drug Holidays (Slow)  What is a "Drug Holiday"? Drug Holiday: is the name given to the period of time during which a patient stops taking a medication(s) for the purpose of eliminating tolerance to the drug.  Benefits . Improved effectiveness of opioids. . Decreased opioid dose needed to achieve benefits. . Improved pain with lesser dose.  What is tolerance? Tolerance: is the progressive decreased in effectiveness of a drug due to its repetitive use. With repetitive use, the body gets use to the medication and as a consequence, it loses its effectiveness. This is a common problem seen with opioid pain medications. As a result, a larger dose of the drug is needed to achieve the same effect that used to be obtained with a smaller dose.  How long should a "Drug Holiday" last? You should stay off of the pain medicine for at least 14 consecutive days. (2 weeks)  Should I stop the medicine "cold turkey"? No. You should always coordinate with your Pain Specialist so that he/she can provide you with the correct medication dose to make the transition as smoothly as possible.  How do I stop the medicine? Slowly. You will be instructed to decrease the daily amount of pills that you take by one (1) pill every seven (7) days. This is called a "slow downward taper" of your dose. For example: if you normally take four (4) pills per day, you will be asked to drop this dose to three (3) pills per day for seven (7) days, then to two (2) pills per day for seven (7) days, then to one (1) per day for seven (7) days, and at the end of those last seven (7) days, this is when the "Drug Holiday" would start.   Will I have withdrawals? By doing a "slow downward taper" like this one, it is unlikely that you will experience any significant withdrawal symptoms. Typically, what triggers withdrawals is the sudden stop of a high  dose opioid therapy. Withdrawals can usually be avoided by slowly decreasing the dose over a prolonged period of time.  What are withdrawals? Withdrawals: refers to the wide range of symptoms that occur after stopping or dramatically reducing opiate drugs after heavy and prolonged use. Withdrawal symptoms do not occur to patients that use low dose opioids, or those who take the medication sporadically. Contrary to benzodiazepine (example: Valium, Xanax, etc.) or alcohol withdrawals ("Delirium Tremens"), opioid withdrawals are not lethal. Withdrawals are the physical manifestation of the body getting rid of the excess receptors.  Expected Symptoms Early symptoms of withdrawal may include: . Agitation . Anxiety . Muscle aches . Increased tearing . Insomnia . Runny nose . Sweating . Yawning  Late symptoms of withdrawal may include: . Abdominal cramping . Diarrhea . Dilated pupils . Goose bumps . Nausea . Vomiting  Will I experience withdrawals? Due to the slow nature of the taper, it is very unlikely that you will experience any.  What is a slow taper? Taper: refers to the gradual decrease in dose.  ___________________________________________________________________________________________    ____________________________________________________________________________________________  Medication Rules  Purpose: To inform patients, and their family members, of our rules and regulations.  Applies to: All patients receiving prescriptions (written or electronic).  Pharmacy of record: Pharmacy where electronic prescriptions will be sent. If written prescriptions are taken to a different pharmacy, please inform the nursing staff. The pharmacy listed in the electronic medical record should be the one where you would like electronic prescriptions to be sent.  Electronic   prescriptions: In compliance with the [place] Strengthen Opioid Misuse Prevention (STOP) Act of 2017 (Session  Law 2017-74/H243), effective November 19, 2018, all controlled substances must be electronically prescribed. Calling prescriptions to the pharmacy will cease to exist.  Prescription refills: Only during scheduled appointments. Applies to all prescriptions.  NOTE: The following applies primarily to controlled substances (Opioid* Pain Medications).   Type of encounter (visit): For patients receiving controlled substances, face-to-face visits are required. (Not an option or up to the patient.)  Patient's responsibilities: 1. Pain Pills: Bring all pain pills to every appointment (except for procedure appointments). 2. Pill Bottles: Bring pills in original pharmacy bottle. Always bring the newest bottle. Bring bottle, even if empty. 3. Medication refills: You are responsible for knowing and keeping track of what medications you take and those you need refilled. The day before your appointment: write a list of all prescriptions that need to be refilled. The day of the appointment: give the list to the admitting nurse. Prescriptions will be written only during appointments. No prescriptions will be written on procedure days. If you forget a medication: it will not be "Called in", "Faxed", or "electronically sent". You will need to get another appointment to get these prescribed. No early refills. Do not call asking to have your prescription filled early. 4. Prescription Accuracy: You are responsible for carefully inspecting your prescriptions before leaving our office. Have the discharge nurse carefully go over each prescription with you, before taking them home. Make sure that your name is accurately spelled, that your address is correct. Check the name and dose of your medication to make sure it is accurate. Check the number of pills, and the written instructions to make sure they are clear and accurate. Make sure that you are given enough medication to last until your next medication refill  appointment. 5. Taking Medication: Take medication as prescribed. When it comes to controlled substances, taking less pills or less frequently than prescribed is permitted and encouraged. Never take more pills than instructed. Never take medication more frequently than prescribed.  6. Inform other Doctors: Always inform, all of your healthcare providers, of all the medications you take. 7. Pain Medication from other Providers: You are not allowed to accept any additional pain medication from any other Doctor or Healthcare provider. There are two exceptions to this rule. (see below) In the event that you require additional pain medication, you are responsible for notifying us, as stated below. 8. Medication Agreement: You are responsible for carefully reading and following our Medication Agreement. This must be signed before receiving any prescriptions from our practice. Safely store a copy of your signed Agreement. Violations to the Agreement will result in no further prescriptions. (Additional copies of our Medication Agreement are available upon request.) 9. Laws, Rules, & Regulations: All patients are expected to follow all Federal and State Laws, Statutes, Rules, & Regulations. Ignorance of the Laws does not constitute a valid excuse.  10. Illegal drugs and Controlled Substances: The use of illegal substances (including, but not limited to marijuana and its derivatives) and/or the illegal use of any controlled substances is strictly prohibited. Violation of this rule may result in the immediate and permanent discontinuation of any and all prescriptions being written by our practice. The use of any illegal substances is prohibited. 11. Adopted CDC guidelines & recommendations: Target dosing levels will be at or below 60 MME/day. Use of benzodiazepines** is not recommended.  Exceptions: There are only two exceptions to the rule of not   receiving pain medications from other Healthcare  Providers. 1. Exception #1 (Emergencies): In the event of an emergency (i.e.: accident requiring emergency care), you are allowed to receive additional pain medication. However, you are responsible for: As soon as you are able, call our office [phone], at any time of the day or night, and leave a message stating your name, the date and nature of the emergency, and the name and dose of the medication prescribed. In the event that your call is answered by a member of our staff, make sure to document and save the date, time, and the name of the person that took your information.  2. Exception #2 (Planned Surgery): In the event that you are scheduled by another doctor or dentist to have any type of surgery or procedure, you are allowed (for a period no longer than 30 days), to receive additional pain medication, for the acute post-op pain. However, in this case, you are responsible for picking up a copy of our "Post-op Pain Management for Surgeons" handout, and giving it to your surgeon or dentist. This document is available at our office, and does not require an appointment to obtain it. Simply go to our office during business hours (Monday-Thursday from 8:00 AM to 4:00 PM) (Friday 8:00 AM to 12:00 Noon) or if you have a scheduled appointment with us, prior to your surgery, and ask for it by name. In addition, you will need to provide us with your name, name of your surgeon, type of surgery, and date of procedure or surgery.  *Opioid medications include: morphine, codeine, oxycodone, oxymorphone, hydrocodone, hydromorphone, meperidine, tramadol, tapentadol, buprenorphine, fentanyl, methadone. **Benzodiazepine medications include: diazepam (Valium), alprazolam (Xanax), clonazepam (Klonopine), lorazepam (Ativan), clorazepate (Tranxene), chlordiazepoxide (Librium), estazolam (Prosom), oxazepam (Serax), temazepam (Restoril), triazolam (Halcion) (Last updated:  01/16/2018) ____________________________________________________________________________________________   ____________________________________________________________________________________________  Medication Recommendations and Reminders  Applies to: All patients receiving prescriptions (written and/or electronic).  Medication Rules & Regulations: These rules and regulations exist for your safety and that of others. They are not flexible and neither are we. Dismissing or ignoring them will be considered "non-compliance" with medication therapy, resulting in complete and irreversible termination of such therapy. (See document titled "Medication Rules" for more details.) In all conscience, because of safety reasons, we cannot continue providing a therapy where the patient does not follow instructions.  Pharmacy of record:   Definition: This is the pharmacy where your electronic prescriptions will be sent.   We do not endorse any particular pharmacy.  You are not restricted in your choice of pharmacy.  The pharmacy listed in the electronic medical record should be the one where you want electronic prescriptions to be sent.  If you choose to change pharmacy, simply notify our nursing staff of your choice of new pharmacy.  Recommendations:  Keep all of your pain medications in a safe place, under lock and key, even if you live alone.   After you fill your prescription, take 1 week's worth of pills and put them away in a safe place. You should keep a separate, properly labeled bottle for this purpose. The remainder should be kept in the original bottle. Use this as your primary supply, until it runs out. Once it's gone, then you know that you have 1 week's worth of medicine, and it is time to come in for a prescription refill. If you do this correctly, it is unlikely that you will ever run out of medicine.  To make sure that the above recommendation works,   it is very important that you  make sure your medication refill appointments are scheduled at least 1 week before you run out of medicine. To do this in an effective manner, make sure that you do not leave the office without scheduling your next medication management appointment. Always ask the nursing staff to show you in your prescription , when your medication will be running out. Then arrange for the receptionist to get you a return appointment, at least 7 days before you run out of medicine. Do not wait until you have 1 or 2 pills left, to come in. This is very poor planning and does not take into consideration that we may need to cancel appointments due to bad weather, sickness, or emergencies affecting our staff.  "Partial Fill": If for any reason your pharmacy does not have enough pills/tablets to completely fill or refill your prescription, do not allow for a "partial fill". You will need a separate prescription to fill the remaining amount, which we will not provide. If the reason for the partial fill is your insurance, you will need to talk to the pharmacist about payment alternatives for the remaining tablets, but again, do not accept a partial fill.  Prescription refills and/or changes in medication(s):   Prescription refills, and/or changes in dose or medication, will be conducted only during scheduled medication management appointments. (Applies to both, written and electronic prescriptions.)  No refills on procedure days. No medication will be changed or started on procedure days. No changes, adjustments, and/or refills will be conducted on a procedure day. Doing so will interfere with the diagnostic portion of the procedure.  No phone refills. No medications will be "called into the pharmacy".  No Fax refills.  No weekend refills.  No Holliday refills.  No after hours refills.  Remember:  Business hours are:  Monday to Thursday 8:00 AM to 4:00 PM Provider's Schedule: Charleene Callegari, MD - Appointments  are:  Medication management: Monday and Wednesday 8:00 AM to 4:00 PM Procedure day: Tuesday and Thursday 7:30 AM to 4:00 PM Bilal Lateef, MD - Appointments are:  Medication management: Tuesday and Thursday 8:00 AM to 4:00 PM Procedure day: Monday and Wednesday 7:30 AM to 4:00 PM (Last update: 01/16/2018) ____________________________________________________________________________________________   ____________________________________________________________________________________________  CANNABIDIOL (AKA: CBD Oil or Pills)  Applies to: All patients receiving prescriptions of controlled substances (written and/or electronic).  General Information: Cannabidiol (CBD) was discovered in 1940. It is one of some 113 identified cannabinoids in cannabis (Marijuana) plants, accounting for up to 40% of the plant's extract. As of 2018, preliminary clinical research on cannabidiol included studies of anxiety, cognition, movement disorders, and pain.  Cannabidiol is consummed in multiple ways, including inhalation of cannabis smoke or vapor, as an aerosol spray into the cheek, and by mouth. It may be supplied as CBD oil containing CBD as the active ingredient (no added tetrahydrocannabinol (THC) or terpenes), a full-plant CBD-dominant hemp extract oil, capsules, dried cannabis, or as a liquid solution. CBD is thought not have the same psychoactivity as THC, and may affect the actions of THC. Studies suggest that CBD may interact with different biological targets, including cannabinoid receptors and other neurotransmitter receptors. As of 2018 the mechanism of action for its biological effects has not been determined.  In the [place], cannabidiol has a limited approval by the Food and Drug Administration (FDA) for treatment of only two types of epilepsy disorders. The side effects of long-term use of the drug include somnolence, decreased appetite, diarrhea,   fatigue, malaise, weakness, sleeping  problems, and others.  CBD remains a Schedule I drug prohibited for any use.  Legality: Some manufacturers ship CBD products nationally, an illegal action which the FDA has not enforced in 2018, with CBD remaining the subject of an FDA investigational new drug evaluation, and is not considered legal as a dietary supplement or food ingredient as of December 2018. Federal illegality has made it difficult historically to conduct research on CBD. CBD is openly sold in head shops and health food stores in some states where such sales have not been explicitly legalized.  Warning: Because it is not FDA approved for general use or treatment of pain, it is not required to undergo the same manufacturing controls as prescription drugs.  This means that the available cannabidiol (CBD) may be contaminated with THC.  If this is the case, it will trigger a positive urine drug screen (UDS) test for cannabinoids (Marijuana).  Because a positive UDS for illicit substances is a violation of our medication agreement, your opioid analgesics (pain medicine) may be permanently discontinued. (Last update: 02/06/2018) ____________________________________________________________________________________________   ____________________________________________________________________________________________  Pain Prevention Technique  Definition:   A technique used to minimize the effects of an activity known to cause inflammation or swelling, which in turn leads to an increase in pain.  Purpose: To prevent swelling from occurring. It is based on the fact that it is easier to prevent swelling from happening than it is to get rid of it, once it occurs.  Contraindications: 1. Anyone with allergy or hypersensitivity to the recommended medications. 2. Anyone taking anticoagulants (Blood Thinners) (e.g., Coumadin, Warfarin, Plavix, etc.). 3. Patients in Renal Failure.  Technique: Before you undertake an activity known to  cause pain, or a flare-up of your chronic pain, and before you experience any pain, do the following:  1. On a full stomach, take 4 (four) over the counter Ibuprofens 200mg  tablets (Motrin), for a total of 800 mg. 2. In addition, take over the counter Magnesium 400 to 500 mg, before doing the activity.  3. Six (6) hours later, again on a full stomach, repeat the Ibuprofen. 4. That night, take a warm shower and stretch under the running warm water.  This technique may be sufficient to abort the pain and discomfort before it happens. Keep in mind that it takes a lot less medication to prevent swelling than it takes to eliminate it once it occurs.  ____________________________________________________________________________________________   ____________________________________________________________________________________________  Muscle Spasms & Cramps  Cause:  The most common cause of muscle spasms and cramps is vitamin and/or electrolyte (calcium, potassium, sodium, etc.) deficiencies.  Possible triggers: Sweating - causes loss of electrolytes thru the skin. Steroids - causes loss of electrolytes thru the urine.  Treatment: 1. Gatorade (or any other electrolyte-replenishing drink) - Take 1, 8 oz glass with each meal (3 times a day). 2. OTC (over-the-counter) Magnesium 400 to 500 mg - Take 1 tablet twice a day (one with breakfast and one before bedtime). If you have kidney problems, talk to your primary care physician before taking any Magnesium. 3. Tonic Water with quinine - Take 1, 8 oz glass before bedtime.   ____________________________________________________________________________________________  Prescriptions for Tizanidine, Magnesium, and Tramadol have been sent to your pharmacy.

## 2020-05-17 DIAGNOSIS — J301 Allergic rhinitis due to pollen: Secondary | ICD-10-CM | POA: Diagnosis not present

## 2020-05-17 NOTE — Telephone Encounter (Signed)
Order placed for pain clinic referral.

## 2020-05-17 NOTE — Addendum Note (Signed)
Addended by: Alisa Graff on: 05/17/2020 09:30 PM   Modules accepted: Orders

## 2020-05-31 DIAGNOSIS — J301 Allergic rhinitis due to pollen: Secondary | ICD-10-CM | POA: Diagnosis not present

## 2020-06-04 ENCOUNTER — Other Ambulatory Visit: Payer: Self-pay | Admitting: Internal Medicine

## 2020-06-04 DIAGNOSIS — G8929 Other chronic pain: Secondary | ICD-10-CM

## 2020-06-04 DIAGNOSIS — M961 Postlaminectomy syndrome, not elsewhere classified: Secondary | ICD-10-CM

## 2020-06-04 DIAGNOSIS — M545 Low back pain, unspecified: Secondary | ICD-10-CM

## 2020-06-04 DIAGNOSIS — G894 Chronic pain syndrome: Secondary | ICD-10-CM

## 2020-06-06 ENCOUNTER — Other Ambulatory Visit: Payer: Self-pay | Admitting: Internal Medicine

## 2020-06-07 DIAGNOSIS — J301 Allergic rhinitis due to pollen: Secondary | ICD-10-CM | POA: Diagnosis not present

## 2020-06-14 DIAGNOSIS — Z872 Personal history of diseases of the skin and subcutaneous tissue: Secondary | ICD-10-CM | POA: Diagnosis not present

## 2020-06-14 DIAGNOSIS — L570 Actinic keratosis: Secondary | ICD-10-CM | POA: Diagnosis not present

## 2020-06-14 DIAGNOSIS — L821 Other seborrheic keratosis: Secondary | ICD-10-CM | POA: Diagnosis not present

## 2020-06-14 DIAGNOSIS — L578 Other skin changes due to chronic exposure to nonionizing radiation: Secondary | ICD-10-CM | POA: Diagnosis not present

## 2020-06-14 DIAGNOSIS — J301 Allergic rhinitis due to pollen: Secondary | ICD-10-CM | POA: Diagnosis not present

## 2020-06-15 DIAGNOSIS — T444X5A Adverse effect of predominantly alpha-adrenoreceptor agonists, initial encounter: Secondary | ICD-10-CM | POA: Diagnosis not present

## 2020-06-15 DIAGNOSIS — J301 Allergic rhinitis due to pollen: Secondary | ICD-10-CM | POA: Diagnosis not present

## 2020-06-25 DIAGNOSIS — R69 Illness, unspecified: Secondary | ICD-10-CM | POA: Diagnosis not present

## 2020-06-28 DIAGNOSIS — J301 Allergic rhinitis due to pollen: Secondary | ICD-10-CM | POA: Diagnosis not present

## 2020-07-05 DIAGNOSIS — J301 Allergic rhinitis due to pollen: Secondary | ICD-10-CM | POA: Diagnosis not present

## 2020-07-07 DIAGNOSIS — R69 Illness, unspecified: Secondary | ICD-10-CM | POA: Diagnosis not present

## 2020-07-12 DIAGNOSIS — J301 Allergic rhinitis due to pollen: Secondary | ICD-10-CM | POA: Diagnosis not present

## 2020-07-17 ENCOUNTER — Other Ambulatory Visit: Payer: Self-pay | Admitting: Pain Medicine

## 2020-07-17 DIAGNOSIS — M62830 Muscle spasm of back: Secondary | ICD-10-CM

## 2020-07-19 DIAGNOSIS — J301 Allergic rhinitis due to pollen: Secondary | ICD-10-CM | POA: Diagnosis not present

## 2020-07-20 DIAGNOSIS — M6530 Trigger finger, unspecified finger: Secondary | ICD-10-CM | POA: Diagnosis not present

## 2020-07-23 ENCOUNTER — Other Ambulatory Visit: Payer: Self-pay | Admitting: Internal Medicine

## 2020-07-23 DIAGNOSIS — M961 Postlaminectomy syndrome, not elsewhere classified: Secondary | ICD-10-CM

## 2020-07-23 DIAGNOSIS — G894 Chronic pain syndrome: Secondary | ICD-10-CM

## 2020-07-23 DIAGNOSIS — M545 Low back pain, unspecified: Secondary | ICD-10-CM

## 2020-07-23 DIAGNOSIS — G8929 Other chronic pain: Secondary | ICD-10-CM

## 2020-07-26 DIAGNOSIS — J301 Allergic rhinitis due to pollen: Secondary | ICD-10-CM | POA: Diagnosis not present

## 2020-08-02 DIAGNOSIS — J301 Allergic rhinitis due to pollen: Secondary | ICD-10-CM | POA: Diagnosis not present

## 2020-08-09 DIAGNOSIS — J301 Allergic rhinitis due to pollen: Secondary | ICD-10-CM | POA: Diagnosis not present

## 2020-08-13 DIAGNOSIS — R69 Illness, unspecified: Secondary | ICD-10-CM | POA: Diagnosis not present

## 2020-08-16 DIAGNOSIS — J301 Allergic rhinitis due to pollen: Secondary | ICD-10-CM | POA: Diagnosis not present

## 2020-08-18 ENCOUNTER — Other Ambulatory Visit: Payer: Medicare HMO

## 2020-08-22 ENCOUNTER — Ambulatory Visit: Payer: Medicare HMO | Admitting: Internal Medicine

## 2020-08-23 DIAGNOSIS — J301 Allergic rhinitis due to pollen: Secondary | ICD-10-CM | POA: Diagnosis not present

## 2020-08-27 ENCOUNTER — Other Ambulatory Visit: Payer: Self-pay | Admitting: Internal Medicine

## 2020-08-30 DIAGNOSIS — J301 Allergic rhinitis due to pollen: Secondary | ICD-10-CM | POA: Diagnosis not present

## 2020-09-01 DIAGNOSIS — Z96642 Presence of left artificial hip joint: Secondary | ICD-10-CM | POA: Diagnosis not present

## 2020-09-01 DIAGNOSIS — E119 Type 2 diabetes mellitus without complications: Secondary | ICD-10-CM | POA: Diagnosis not present

## 2020-09-01 DIAGNOSIS — I700 Atherosclerosis of aorta: Secondary | ICD-10-CM | POA: Diagnosis not present

## 2020-09-09 ENCOUNTER — Other Ambulatory Visit: Payer: Self-pay | Admitting: Internal Medicine

## 2020-09-12 ENCOUNTER — Other Ambulatory Visit: Payer: Self-pay | Admitting: Internal Medicine

## 2020-09-12 ENCOUNTER — Other Ambulatory Visit: Payer: Medicare HMO

## 2020-09-12 ENCOUNTER — Other Ambulatory Visit (INDEPENDENT_AMBULATORY_CARE_PROVIDER_SITE_OTHER): Payer: Medicare HMO

## 2020-09-12 ENCOUNTER — Other Ambulatory Visit: Payer: Self-pay

## 2020-09-12 ENCOUNTER — Telehealth: Payer: Self-pay | Admitting: *Deleted

## 2020-09-12 DIAGNOSIS — E119 Type 2 diabetes mellitus without complications: Secondary | ICD-10-CM

## 2020-09-12 DIAGNOSIS — Z23 Encounter for immunization: Secondary | ICD-10-CM | POA: Diagnosis not present

## 2020-09-12 DIAGNOSIS — I10 Essential (primary) hypertension: Secondary | ICD-10-CM

## 2020-09-12 DIAGNOSIS — E7800 Pure hypercholesterolemia, unspecified: Secondary | ICD-10-CM

## 2020-09-12 LAB — HEMOGLOBIN A1C: Hgb A1c MFr Bld: 6 % (ref 4.6–6.5)

## 2020-09-12 LAB — LIPID PANEL
Cholesterol: 180 mg/dL (ref 0–200)
HDL: 66.7 mg/dL
LDL Cholesterol: 90 mg/dL (ref 0–99)
NonHDL: 113.2
Total CHOL/HDL Ratio: 3
Triglycerides: 118 mg/dL (ref 0.0–149.0)
VLDL: 23.6 mg/dL (ref 0.0–40.0)

## 2020-09-12 LAB — BASIC METABOLIC PANEL WITH GFR
BUN: 21 mg/dL (ref 6–23)
CO2: 30 meq/L (ref 19–32)
Calcium: 9.8 mg/dL (ref 8.4–10.5)
Chloride: 103 meq/L (ref 96–112)
Creatinine, Ser: 0.84 mg/dL (ref 0.40–1.20)
GFR: 68.01 mL/min
Glucose, Bld: 108 mg/dL — ABNORMAL HIGH (ref 70–99)
Potassium: 4 meq/L (ref 3.5–5.1)
Sodium: 142 meq/L (ref 135–145)

## 2020-09-12 LAB — HEPATIC FUNCTION PANEL
ALT: 15 U/L (ref 0–35)
AST: 20 U/L (ref 0–37)
Albumin: 4.2 g/dL (ref 3.5–5.2)
Alkaline Phosphatase: 75 U/L (ref 39–117)
Bilirubin, Direct: 0.1 mg/dL (ref 0.0–0.3)
Total Bilirubin: 0.7 mg/dL (ref 0.2–1.2)
Total Protein: 6.3 g/dL (ref 6.0–8.3)

## 2020-09-12 NOTE — Telephone Encounter (Signed)
Please place future orders for lab appt.   Pt has a 10am lab appt this morning

## 2020-09-12 NOTE — Telephone Encounter (Signed)
Orders placed. Thanks.

## 2020-09-12 NOTE — Progress Notes (Signed)
Orders placed for labs

## 2020-09-13 DIAGNOSIS — J301 Allergic rhinitis due to pollen: Secondary | ICD-10-CM | POA: Diagnosis not present

## 2020-09-15 ENCOUNTER — Ambulatory Visit (INDEPENDENT_AMBULATORY_CARE_PROVIDER_SITE_OTHER): Payer: Medicare HMO | Admitting: Internal Medicine

## 2020-09-15 ENCOUNTER — Other Ambulatory Visit: Payer: Self-pay

## 2020-09-15 ENCOUNTER — Ambulatory Visit (INDEPENDENT_AMBULATORY_CARE_PROVIDER_SITE_OTHER): Payer: Medicare HMO

## 2020-09-15 DIAGNOSIS — Z1231 Encounter for screening mammogram for malignant neoplasm of breast: Secondary | ICD-10-CM | POA: Diagnosis not present

## 2020-09-15 DIAGNOSIS — M5442 Lumbago with sciatica, left side: Secondary | ICD-10-CM

## 2020-09-15 DIAGNOSIS — F320 Major depressive disorder, single episode, mild: Secondary | ICD-10-CM | POA: Diagnosis not present

## 2020-09-15 DIAGNOSIS — M5441 Lumbago with sciatica, right side: Secondary | ICD-10-CM

## 2020-09-15 DIAGNOSIS — I700 Atherosclerosis of aorta: Secondary | ICD-10-CM

## 2020-09-15 DIAGNOSIS — E7800 Pure hypercholesterolemia, unspecified: Secondary | ICD-10-CM

## 2020-09-15 DIAGNOSIS — K219 Gastro-esophageal reflux disease without esophagitis: Secondary | ICD-10-CM | POA: Diagnosis not present

## 2020-09-15 DIAGNOSIS — G8929 Other chronic pain: Secondary | ICD-10-CM

## 2020-09-15 DIAGNOSIS — R69 Illness, unspecified: Secondary | ICD-10-CM | POA: Diagnosis not present

## 2020-09-15 DIAGNOSIS — M545 Low back pain, unspecified: Secondary | ICD-10-CM | POA: Insufficient documentation

## 2020-09-15 DIAGNOSIS — Z981 Arthrodesis status: Secondary | ICD-10-CM | POA: Diagnosis not present

## 2020-09-15 DIAGNOSIS — E119 Type 2 diabetes mellitus without complications: Secondary | ICD-10-CM | POA: Diagnosis not present

## 2020-09-15 DIAGNOSIS — M5416 Radiculopathy, lumbar region: Secondary | ICD-10-CM | POA: Diagnosis not present

## 2020-09-15 DIAGNOSIS — I10 Essential (primary) hypertension: Secondary | ICD-10-CM

## 2020-09-15 DIAGNOSIS — Z9889 Other specified postprocedural states: Secondary | ICD-10-CM | POA: Diagnosis not present

## 2020-09-15 DIAGNOSIS — F32A Depression, unspecified: Secondary | ICD-10-CM

## 2020-09-15 LAB — HM DIABETES FOOT EXAM

## 2020-09-15 NOTE — Progress Notes (Signed)
Patient ID: Erin Camacho, female   DOB: 1946/08/12, 74 y.o.   MRN: [id number]   Subjective:    Patient ID: Erin Camacho, female    DOB: 31-Mar-1946, 74 y.o.   MRN: [id number]  HPI This visit occurred during the SARS-CoV-2 public health emergency.  Safety protocols were in place, including screening questions prior to the visit, additional usage of staff PPE, and extensive cleaning of exam room while observing appropriate contact time as indicated for disinfecting solutions.  Patient here for a scheduled follow up.  Here to follow up regarding sugars, cholesterol and blood pressure.  Cramping is better on magnesium.  Wants rx for magnesium.  Also taking calcium with vitamin D.  No chest pain or sob reported.  No abdominal pain.  Bowels doing better since off pain medication.  Persistent low back pain - pain in upper thighs/upper legs.  Aching at night.  Numbness down to upper thighs.  Saw ortho recently for f/u regarding her hip.  They suggested symptoms coming from her back.  Affecting her walking.  Discussed recheck back xray - to confirm no acute change.  Discussed PT.  Not following with pain clinic.   Past Medical History:  Diagnosis Date  . Allergy    hay fever  . Anxiety   . Arthritis   . Chronic back pain   . Diabetes mellitus   . Dyspnea    a. 07/2017 Echo: EF 60-65%. No rwma. Mildly dil LA. Nl RV fxn.  Marland Kitchen GERD (gastroesophageal reflux disease)   . History of hiatal hernia   . Hyperlipidemia   . Hypertension   . Osteoarthritis   . Pneumonia   . Spinal stenosis   . UTI (lower urinary tract infection)    Past Surgical History:  Procedure Laterality Date  . ABDOMINAL HYSTERECTOMY  1973  . ABDOMINAL HYSTERECTOMY    . APPENDECTOMY    . APPLICATION OF ROBOTIC ASSISTANCE FOR SPINAL PROCEDURE N/A 09/15/2018   Procedure: APPLICATION OF ROBOTIC ASSISTANCE FOR SPINAL PROCEDURE;  Surgeon: Kristeen Miss, MD;  Location: [place];  Service: Neurosurgery;  Laterality: N/A;  . BACK SURGERY   04/17/13, 2017  . CATARACT EXTRACTION W/PHACO Left 12/16/2019   Procedure: CATARACT EXTRACTION PHACO AND INTRAOCULAR LENS PLACEMENT (IOC) LEFT PANOPTIX LENS DIABETIC 8.07 01:19.2 10.2%;  Surgeon: Leandrew Koyanagi, MD;  Location: [place];  Service: Ophthalmology;  Laterality: Left;  Diabetic - oral meds  . CATARACT EXTRACTION W/PHACO Right 01/06/2020   Procedure: CATARACT EXTRACTION PHACO AND INTRAOCULAR LENS PLACEMENT (IOC) PANOPTIX LENS RIGHT DIABETIC 10.73 01:08.6 15.7%;  Surgeon: Leandrew Koyanagi, MD;  Location: [facility];  Service: Ophthalmology;  Laterality: Right;  diabetic  . CHOLECYSTECTOMY  1995  . HERNIA REPAIR  08/2006  . lap band  08/2003  . SEPTOPLASTY  04/1978  . TONSILLECTOMY AND ADENOIDECTOMY  1979  . TOTAL HIP ARTHROPLASTY  02/2012   Right, Dr. Marry Guan  . TOTAL HIP ARTHROPLASTY Left 08/31/2019   Procedure: TOTAL HIP ARTHROPLASTY;  Surgeon: Dereck Leep, MD;  Location: [facility];  Service: Orthopedics;  Laterality: Left;   Family History  Problem Relation Age of Onset  . Hypertension Mother   . Stroke Mother   . Heart disease Mother   . Hypertension Father   . Stroke Father   . Heart disease Father   . Arthritis Sister   . Diabetes Sister   . Dementia Sister   . Cancer Sister   . Diabetes Brother   . Hypertension Brother   .  Diabetes Brother   . Hypertension Brother   . Breast cancer Other 47  . Cancer Other        breast  . Diabetes Other   . Kidney disease Neg Hx   . Bladder Cancer Neg Hx    Social History   Socioeconomic History  . Marital status: Married    Spouse name: Not on file  . Number of children: Not on file  . Years of education: Not on file  . Highest education level: Not on file  Occupational History  . Not on file  Tobacco Use  . Smoking status: Never Smoker  . Smokeless tobacco: Never Used  Vaping Use  . Vaping Use: Never used  Substance and Sexual Activity  . Alcohol use: Yes    Alcohol/week: 0.0 -  1.0 standard drinks    Comment: occ  . Drug use: No  . Sexual activity: Not Currently  Other Topics Concern  . Not on file  Social History Narrative   Lives in [place] with husband.      Work - retired Sports administrator   Social Determinants of Radio broadcast assistant Strain:   . Difficulty of Paying Living Expenses: Not on file  Food Insecurity:   . Worried About Charity fundraiser in the Last Year: Not on file  . Ran Out of Food in the Last Year: Not on file  Transportation Needs:   . Lack of Transportation (Medical): Not on file  . Lack of Transportation (Non-Medical): Not on file  Physical Activity:   . Days of Exercise per Week: Not on file  . Minutes of Exercise per Session: Not on file  Stress:   . Feeling of Stress : Not on file  Social Connections:   . Frequency of Communication with Friends and Family: Not on file  . Frequency of Social Gatherings with Friends and Family: Not on file  . Attends Religious Services: Not on file  . Active Member of Clubs or Organizations: Not on file  . Attends Archivist Meetings: Not on file  . Marital Status: Not on file    Outpatient Encounter Medications as of 09/15/2020  Medication Sig  . Ferrous Sulfate (IRON) 325 (65 Fe) MG TABS   . Cholecalciferol (VITAMIN D3) 2000 units TABS Take 2,000 Units by mouth daily.  . Cholecalciferol (VITAMIN D3) 50 MCG (2000 UT) capsule   . fexofenadine (ALLEGRA) 180 MG tablet Take 1 tablet (180 mg total) by mouth daily.  . fluticasone (FLONASE) 50 MCG/ACT nasal spray SPRAY 2 SPRAYS INTO EACH NOSTRIL EVERY DAY (Patient taking differently: Place 2 sprays into both nostrils at bedtime. )  . losartan-hydrochlorothiazide (HYZAAR) 50-12.5 MG tablet TAKE 1 TABLET BY MOUTH EVERY DAY  . metFORMIN (GLUCOPHAGE) 500 MG tablet TAKE 1 TABLET BY MOUTH EVERY DAY  . metoprolol succinate (TOPROL-XL) 25 MG 24 hr tablet TAKE 1/2 TABLET BY MOUTH EVERY DAY  . Multiple Vitamin (MULTIVITAMIN) tablet  Take 1 tablet by mouth daily.  Glory Rosebush VERIO test strip USE TO CHECK BLOOD SUGAR TWICE A DAY  . pantoprazole (PROTONIX) 40 MG tablet TAKE 1 TABLET (40 MG TOTAL) BY MOUTH 2 (TWO) TIMES DAILY BEFORE A MEAL.  Marland Kitchen sertraline (ZOLOFT) 100 MG tablet TAKE 1/2 TABLET BY MOUTH (50 MG) BY MOUTH DAILY  . simvastatin (ZOCOR) 10 MG tablet TAKE 1/2 TABLET (5 MG TOTAL) BY MOUTH AT BEDTIME.  . traMADol (ULTRAM) 50 MG tablet Take 1 tablet (50 mg total) by  mouth 2 (two) times daily.  . traZODone (DESYREL) 100 MG tablet TAKE 1 TABLET BY MOUTH EVERYDAY AT BEDTIME  . [DISCONTINUED] Magnesium Oxide 500 MG CAPS Take 1 capsule (500 mg total) by mouth 2 (two) times daily at 8 am and 10 pm.  . [DISCONTINUED] tiZANidine (ZANAFLEX) 2 MG tablet TAKE 1 TABLET (2 MG TOTAL) BY MOUTH 2 (TWO) TIMES DAILY AS NEEDED FOR MUSCLE SPASMS.  . [DISCONTINUED] vitamin B-12 (CYANOCOBALAMIN) 1000 MCG tablet Take 1,000 mcg by mouth daily.   No facility-administered encounter medications on file as of 09/15/2020.    Review of Systems  Constitutional: Negative for appetite change and unexpected weight change.  HENT: Negative for congestion and sinus pressure.   Respiratory: Negative for cough, chest tightness and shortness of breath.   Cardiovascular: Negative for chest pain, palpitations and leg swelling.  Gastrointestinal: Negative for abdominal pain, diarrhea, nausea and vomiting.  Genitourinary: Negative for difficulty urinating and dysuria.  Musculoskeletal: Positive for back pain. Negative for joint swelling and myalgias.       Cramps better.   Skin: Negative for color change and rash.  Neurological: Negative for dizziness, light-headedness and headaches.  Psychiatric/Behavioral: Negative for agitation and dysphoric mood.       Stress is better.        Objective:    Physical Exam Vitals reviewed.  Constitutional:      General: She is not in acute distress.    Appearance: Normal appearance.  HENT:     Head: Normocephalic  and atraumatic.     Right Ear: External ear normal.     Left Ear: External ear normal.  Eyes:     General: No scleral icterus.       Right eye: No discharge.        Left eye: No discharge.     Conjunctiva/sclera: Conjunctivae normal.  Neck:     Thyroid: No thyromegaly.  Cardiovascular:     Rate and Rhythm: Normal rate and regular rhythm.  Pulmonary:     Effort: No respiratory distress.     Breath sounds: Normal breath sounds. No wheezing.  Abdominal:     General: Bowel sounds are normal.     Palpations: Abdomen is soft.     Tenderness: There is no abdominal tenderness.  Musculoskeletal:        General: No tenderness.     Cervical back: Neck supple. No tenderness.     Comments: No increased swelling.   Lymphadenopathy:     Cervical: No cervical adenopathy.  Skin:    Findings: No erythema or rash.  Neurological:     Mental Status: She is alert.  Psychiatric:        Mood and Affect: Mood normal.        Behavior: Behavior normal.     BP 130/70   Pulse 67   Temp 98.5 F (36.9 C) (Oral)   Resp 16   Ht $R'5\' 5"'as$  (1.651 m)   Wt 186 lb (84.4 kg)   SpO2 98%   BMI 30.95 kg/m  Wt Readings from Last 3 Encounters:  09/15/20 186 lb (84.4 kg)  05/16/20 175 lb (79.4 kg)  04/21/20 176 lb (79.8 kg)     Lab Results  Component Value Date   WBC 4.9 04/19/2020   HGB 12.2 04/19/2020   HCT 36.9 04/19/2020   PLT 179.0 04/19/2020   GLUCOSE 108 (H) 09/12/2020   CHOL 180 09/12/2020   TRIG 118.0 09/12/2020   HDL 66.70 09/12/2020  LDLDIRECT 146.5 09/19/2012   LDLCALC 90 09/12/2020   ALT 15 09/12/2020   AST 20 09/12/2020   NA 142 09/12/2020   K 4.0 09/12/2020   CL 103 09/12/2020   CREATININE 0.84 09/12/2020   BUN 21 09/12/2020   CO2 30 09/12/2020   TSH 2.24 04/19/2020   INR 1.0 08/21/2019   HGBA1C 6.0 09/12/2020   MICROALBUR 0.8 04/19/2020    DG Thoracic Spine 2 View  Result Date: 04/21/2020 CLINICAL DATA:  Chronic upper back pain. EXAM: THORACIC SPINE 2 VIEWS COMPARISON:   None. FINDINGS: Status post surgical posterior fusion involving the T10, T11 and T12 vertebral bodies with bilateral intrapedicular screw placement. No fracture or spondylolisthesis is noted. Mild degenerative disc disease is noted at T8-9 and T9-10 with anterior osteophyte formation. IMPRESSION: Status post surgical posterior fusion as described above. No acute abnormality seen in the thoracic spine. Electronically Signed   By: Marijo Conception M.D.   On: 04/21/2020 08:39   DG Lumbar Spine Complete W/Bend  Result Date: 04/21/2020 CLINICAL DATA:  Chronic low back pain. EXAM: LUMBAR SPINE - COMPLETE WITH BENDING VIEWS COMPARISON:  December 10, 2018. FINDINGS: Status post surgical posterior fusion extending from T10 to S1 with bilateral intrapedicular screw placement. Interbody fusion is noted extending from L1-2 to L5-S1. No fracture or spondylolisthesis is noted. No change in vertebral body alignment is noted on flexion or extension views. IMPRESSION: Postsurgical changes as described above. No acute abnormality seen in the lumbar spine. No change in vertebral body alignment is noted on flexion or extension views. Electronically Signed   By: Marijo Conception M.D.   On: 04/21/2020 08:37       Assessment & Plan:   Problem List Items Addressed This Visit    Mild depression ([place])    Continue sertraline.  Overall stable.  Stress is better.  Follow       Low back pain    Low back pain as outlined.  Has a history of chronic back pain.  Was referred to pain clinic. Not following up with them.  Has seen surgery.  Will check xray today to confirm no acute abnormality.  Discussed PT.       Relevant Orders   Ambulatory referral to Physical Therapy   DG Lumbar Spine 2-3 Views (Completed)   Hypercholesterolemia    On simvastatin.  Low cholesterol diet and exercise.  Follow lipid panel and liver function tests.        Relevant Orders   Lipid panel   Hepatic function panel   GERD (gastroesophageal reflux  disease)    No upper symptoms reported.  On protonix.       Essential (primary) hypertension    Blood pressure as outlined.  Appears to be under good control.  Continue losartan/hctz and metoprolol.  Follow pressures.  Follow metabolic panel.       Diabetes mellitus type 2, diet-controlled (HCC)    Low carb diet and exercise.  Follow met b and a1c.       Relevant Orders   Hemoglobin R4Y   Basic metabolic panel   Chronic low back pain (1ry area of Pain) (Bilateral) (R>L) w/o sciatica (Chronic)    Evaluated by pain clinic.  Not following up with them now.  With increased pain, check L-S spine xray to confirm no acute change/abnormality.  Discussed PT.       Relevant Orders   Ambulatory referral to Physical Therapy   Aortic atherosclerosis ([place])  On simvastatin.        Other Visit Diagnoses    Encounter for screening mammogram for malignant neoplasm of breast       Relevant Orders   MM 3D SCREEN BREAST BILATERAL   Visit for screening mammogram           [name], MD

## 2020-09-18 ENCOUNTER — Other Ambulatory Visit: Payer: Self-pay | Admitting: Internal Medicine

## 2020-09-18 DIAGNOSIS — G894 Chronic pain syndrome: Secondary | ICD-10-CM

## 2020-09-18 DIAGNOSIS — G8929 Other chronic pain: Secondary | ICD-10-CM

## 2020-09-18 DIAGNOSIS — M545 Low back pain, unspecified: Secondary | ICD-10-CM

## 2020-09-18 DIAGNOSIS — M961 Postlaminectomy syndrome, not elsewhere classified: Secondary | ICD-10-CM

## 2020-09-20 DIAGNOSIS — J301 Allergic rhinitis due to pollen: Secondary | ICD-10-CM | POA: Diagnosis not present

## 2020-09-21 ENCOUNTER — Encounter: Payer: Self-pay | Admitting: Internal Medicine

## 2020-09-21 MED ORDER — MAGNESIUM OXIDE 400 MG PO TABS: 400.0000 mg | ORAL_TABLET | Freq: Every day | ORAL | 2 refills | Status: DC

## 2020-09-21 NOTE — Telephone Encounter (Signed)
Are you ok to send in her magnesium? Dr Christian Mate previously sent in

## 2020-09-21 NOTE — Telephone Encounter (Signed)
rx sent in for mag oxide.  Take on per day.

## 2020-09-22 DIAGNOSIS — M48062 Spinal stenosis, lumbar region with neurogenic claudication: Secondary | ICD-10-CM | POA: Diagnosis not present

## 2020-09-22 DIAGNOSIS — Z6831 Body mass index (BMI) 31.0-31.9, adult: Secondary | ICD-10-CM | POA: Diagnosis not present

## 2020-09-22 DIAGNOSIS — I10 Essential (primary) hypertension: Secondary | ICD-10-CM | POA: Diagnosis not present

## 2020-09-25 ENCOUNTER — Encounter: Payer: Self-pay | Admitting: Internal Medicine

## 2020-09-25 NOTE — Assessment & Plan Note (Signed)
On simvastatin.   

## 2020-09-25 NOTE — Assessment & Plan Note (Signed)
No upper symptoms reported.  On protonix.   

## 2020-09-25 NOTE — Assessment & Plan Note (Signed)
Low carb diet and exercise.  Follow met b and a1c.  

## 2020-09-25 NOTE — Assessment & Plan Note (Signed)
Continue sertraline.  Overall stable.  Stress is better.  Follow

## 2020-09-25 NOTE — Assessment & Plan Note (Signed)
Evaluated by pain clinic.  Not following up with them now.  With increased pain, check L-S spine xray to confirm no acute change/abnormality.  Discussed PT.

## 2020-09-25 NOTE — Assessment & Plan Note (Signed)
On simvastatin.  Low cholesterol diet and exercise.  Follow lipid panel and liver function tests.   

## 2020-09-25 NOTE — Assessment & Plan Note (Signed)
Low back pain as outlined.  Has a history of chronic back pain.  Was referred to pain clinic. Not following up with them.  Has seen surgery.  Will check xray today to confirm no acute abnormality.  Discussed PT.

## 2020-09-25 NOTE — Assessment & Plan Note (Signed)
Blood pressure as outlined.  Appears to be under good control.  Continue losartan/hctz and metoprolol.  Follow pressures.  Follow metabolic panel.

## 2020-09-26 ENCOUNTER — Other Ambulatory Visit: Payer: Self-pay | Admitting: Neurological Surgery

## 2020-09-26 DIAGNOSIS — M48062 Spinal stenosis, lumbar region with neurogenic claudication: Secondary | ICD-10-CM

## 2020-09-27 DIAGNOSIS — J301 Allergic rhinitis due to pollen: Secondary | ICD-10-CM | POA: Diagnosis not present

## 2020-10-01 ENCOUNTER — Other Ambulatory Visit: Payer: Self-pay | Admitting: Internal Medicine

## 2020-10-03 ENCOUNTER — Other Ambulatory Visit: Payer: Self-pay | Admitting: Internal Medicine

## 2020-10-03 DIAGNOSIS — R69 Illness, unspecified: Secondary | ICD-10-CM | POA: Diagnosis not present

## 2020-10-04 DIAGNOSIS — J301 Allergic rhinitis due to pollen: Secondary | ICD-10-CM | POA: Diagnosis not present

## 2020-10-11 DIAGNOSIS — J301 Allergic rhinitis due to pollen: Secondary | ICD-10-CM | POA: Diagnosis not present

## 2020-10-16 ENCOUNTER — Ambulatory Visit
Admission: RE | Admit: 2020-10-16 | Discharge: 2020-10-16 | Disposition: A | Discharge status: Home / Self Care | Payer: Medicare HMO | Source: Ambulatory Visit | Attending: Neurological Surgery | Admitting: Neurological Surgery

## 2020-10-16 ENCOUNTER — Other Ambulatory Visit: Payer: Self-pay | Admitting: Internal Medicine

## 2020-10-16 ENCOUNTER — Other Ambulatory Visit: Payer: Self-pay

## 2020-10-16 DIAGNOSIS — M4804 Spinal stenosis, thoracic region: Secondary | ICD-10-CM | POA: Diagnosis not present

## 2020-10-16 DIAGNOSIS — M5124 Other intervertebral disc displacement, thoracic region: Secondary | ICD-10-CM | POA: Diagnosis not present

## 2020-10-16 DIAGNOSIS — M4314 Spondylolisthesis, thoracic region: Secondary | ICD-10-CM | POA: Diagnosis not present

## 2020-10-16 DIAGNOSIS — M48062 Spinal stenosis, lumbar region with neurogenic claudication: Secondary | ICD-10-CM

## 2020-10-16 DIAGNOSIS — Z981 Arthrodesis status: Secondary | ICD-10-CM | POA: Diagnosis not present

## 2020-10-18 DIAGNOSIS — J301 Allergic rhinitis due to pollen: Secondary | ICD-10-CM | POA: Diagnosis not present

## 2020-10-20 DIAGNOSIS — M5104 Intervertebral disc disorders with myelopathy, thoracic region: Secondary | ICD-10-CM | POA: Diagnosis not present

## 2020-10-25 DIAGNOSIS — J301 Allergic rhinitis due to pollen: Secondary | ICD-10-CM | POA: Diagnosis not present

## 2020-10-28 ENCOUNTER — Other Ambulatory Visit: Payer: Self-pay | Admitting: Neurological Surgery

## 2020-11-01 DIAGNOSIS — J301 Allergic rhinitis due to pollen: Secondary | ICD-10-CM | POA: Diagnosis not present

## 2020-11-08 ENCOUNTER — Other Ambulatory Visit: Payer: Self-pay

## 2020-11-08 ENCOUNTER — Ambulatory Visit
Admission: RE | Admit: 2020-11-08 | Discharge: 2020-11-08 | Disposition: A | Discharge status: Home / Self Care | Payer: Medicare HMO | Source: Ambulatory Visit | Attending: Internal Medicine | Admitting: Internal Medicine

## 2020-11-08 DIAGNOSIS — J301 Allergic rhinitis due to pollen: Secondary | ICD-10-CM | POA: Diagnosis not present

## 2020-11-08 DIAGNOSIS — Z1231 Encounter for screening mammogram for malignant neoplasm of breast: Secondary | ICD-10-CM | POA: Diagnosis present

## 2020-11-09 ENCOUNTER — Other Ambulatory Visit: Payer: Self-pay | Admitting: Internal Medicine

## 2020-11-09 DIAGNOSIS — M961 Postlaminectomy syndrome, not elsewhere classified: Secondary | ICD-10-CM

## 2020-11-09 DIAGNOSIS — M545 Low back pain, unspecified: Secondary | ICD-10-CM

## 2020-11-09 DIAGNOSIS — G894 Chronic pain syndrome: Secondary | ICD-10-CM

## 2020-11-09 DIAGNOSIS — G8929 Other chronic pain: Secondary | ICD-10-CM

## 2020-11-15 DIAGNOSIS — J301 Allergic rhinitis due to pollen: Secondary | ICD-10-CM | POA: Diagnosis not present

## 2020-11-17 DIAGNOSIS — E119 Type 2 diabetes mellitus without complications: Secondary | ICD-10-CM | POA: Diagnosis not present

## 2020-11-17 LAB — HM DIABETES EYE EXAM

## 2020-11-22 DIAGNOSIS — J301 Allergic rhinitis due to pollen: Secondary | ICD-10-CM | POA: Diagnosis not present

## 2020-11-28 ENCOUNTER — Encounter (HOSPITAL_COMMUNITY): Payer: Self-pay

## 2020-11-28 ENCOUNTER — Other Ambulatory Visit
Admission: RE | Admit: 2020-11-28 | Discharge: 2020-11-28 | Disposition: A | Discharge status: Home / Self Care | Payer: Medicare HMO | Source: Ambulatory Visit | Attending: Neurological Surgery | Admitting: Neurological Surgery

## 2020-11-28 ENCOUNTER — Other Ambulatory Visit: Payer: Self-pay

## 2020-11-28 ENCOUNTER — Encounter (HOSPITAL_COMMUNITY)
Admission: RE | Admit: 2020-11-28 | Discharge: 2020-11-28 | Disposition: A | Discharge status: Home / Self Care | Payer: Medicare HMO | Source: Ambulatory Visit | Attending: Neurological Surgery | Admitting: Neurological Surgery

## 2020-11-28 DIAGNOSIS — Z01812 Encounter for preprocedural laboratory examination: Secondary | ICD-10-CM | POA: Diagnosis present

## 2020-11-28 DIAGNOSIS — K219 Gastro-esophageal reflux disease without esophagitis: Secondary | ICD-10-CM | POA: Insufficient documentation

## 2020-11-28 DIAGNOSIS — E785 Hyperlipidemia, unspecified: Secondary | ICD-10-CM | POA: Insufficient documentation

## 2020-11-28 DIAGNOSIS — R0600 Dyspnea, unspecified: Secondary | ICD-10-CM | POA: Insufficient documentation

## 2020-11-28 DIAGNOSIS — Z20822 Contact with and (suspected) exposure to covid-19: Secondary | ICD-10-CM | POA: Insufficient documentation

## 2020-11-28 DIAGNOSIS — Z9884 Bariatric surgery status: Secondary | ICD-10-CM | POA: Insufficient documentation

## 2020-11-28 DIAGNOSIS — Z79891 Long term (current) use of opiate analgesic: Secondary | ICD-10-CM | POA: Insufficient documentation

## 2020-11-28 DIAGNOSIS — I10 Essential (primary) hypertension: Secondary | ICD-10-CM | POA: Diagnosis not present

## 2020-11-28 DIAGNOSIS — E669 Obesity, unspecified: Secondary | ICD-10-CM | POA: Diagnosis not present

## 2020-11-28 DIAGNOSIS — Z96642 Presence of left artificial hip joint: Secondary | ICD-10-CM | POA: Insufficient documentation

## 2020-11-28 DIAGNOSIS — Z79899 Other long term (current) drug therapy: Secondary | ICD-10-CM | POA: Diagnosis not present

## 2020-11-28 DIAGNOSIS — M5104 Intervertebral disc disorders with myelopathy, thoracic region: Secondary | ICD-10-CM | POA: Insufficient documentation

## 2020-11-28 DIAGNOSIS — Z7984 Long term (current) use of oral hypoglycemic drugs: Secondary | ICD-10-CM | POA: Insufficient documentation

## 2020-11-28 DIAGNOSIS — Z6831 Body mass index (BMI) 31.0-31.9, adult: Secondary | ICD-10-CM | POA: Insufficient documentation

## 2020-11-28 DIAGNOSIS — Z981 Arthrodesis status: Secondary | ICD-10-CM | POA: Insufficient documentation

## 2020-11-28 DIAGNOSIS — E119 Type 2 diabetes mellitus without complications: Secondary | ICD-10-CM | POA: Diagnosis not present

## 2020-11-28 DIAGNOSIS — Z01818 Encounter for other preprocedural examination: Secondary | ICD-10-CM | POA: Insufficient documentation

## 2020-11-28 LAB — BASIC METABOLIC PANEL WITH GFR
Anion gap: 10 (ref 5–15)
BUN: 17 mg/dL (ref 8–23)
CO2: 26 mmol/L (ref 22–32)
Calcium: 9.9 mg/dL (ref 8.9–10.3)
Chloride: 102 mmol/L (ref 98–111)
Creatinine, Ser: 0.94 mg/dL (ref 0.44–1.00)
GFR, Estimated: >60 mL/min
Glucose, Bld: 111 mg/dL — ABNORMAL HIGH (ref 70–99)
Potassium: 3.7 mmol/L (ref 3.5–5.1)
Sodium: 138 mmol/L (ref 135–145)

## 2020-11-28 LAB — SURGICAL PCR SCREEN
MRSA, PCR: NEGATIVE
Staphylococcus aureus: NEGATIVE

## 2020-11-28 LAB — CBC
HCT: 40.3 % (ref 36.0–46.0)
Hemoglobin: 12.7 g/dL (ref 12.0–15.0)
MCH: 31.9 pg (ref 26.0–34.0)
MCHC: 31.5 g/dL (ref 30.0–36.0)
MCV: 101.3 fL — ABNORMAL HIGH (ref 80.0–100.0)
Platelets: 190 10*3/uL (ref 150–400)
RBC: 3.98 MIL/uL (ref 3.87–5.11)
RDW: 13 % (ref 11.5–15.5)
WBC: 5.6 10*3/uL (ref 4.0–10.5)
nRBC: 0 % (ref 0.0–0.2)

## 2020-11-28 LAB — GLUCOSE, CAPILLARY: Glucose-Capillary: 104 mg/dL — ABNORMAL HIGH (ref 70–99)

## 2020-11-28 LAB — HEMOGLOBIN A1C
Hgb A1c MFr Bld: 5.3 % (ref 4.8–5.6)
Mean Plasma Glucose: 105.41 mg/dL

## 2020-11-28 LAB — SARS CORONAVIRUS 2 (TAT 6-24 HRS): SARS Coronavirus 2: NEGATIVE

## 2020-11-28 NOTE — Progress Notes (Addendum)
Your procedure is scheduled on Thursday Dec 01, 2020.  Report to [facility] Main Entrance "A" at 09:00 A.M., and check in at the Admitting office.  Call this number if you have problems the morning of surgery: [phone]  Call [phone] if you have any questions prior to your surgery date Monday-Friday 8am-4pm   Remember: Do not eat or drink after midnight the night before your surgery   Take these medicines the morning of surgery with A SIP OF WATER: fexofenadine (ALLEGRA) fluticasone (FLONASE) metoprolol succinate (TOPROL-XL) pantoprazole (PROTONIX)  sertraline (ZOLOFT)  If needed: acetaminophen (TYLENOL) ALPRAZolam (XANAX)  tiZANidine (ZANAFLEX)  traMADol (ULTRAM)   As of today, STOP taking any Aspirin (unless otherwise instructed by your surgeon), Aleve, Naproxen, Ibuprofen, Motrin, Advil, Goody's, BC's, all herbal medications, fish oil, and all vitamins.  WHAT DO I DO ABOUT MY DIABETES MEDICATION?  . DO NOT take oral diabetic medication the morning of surgery.  . The day of surgery, do not take other diabetes injectables, including Byetta (exenatide), Bydureon (exenatide ER), Victoza (liraglutide), or Trulicity (dulaglutide).  . If your CBG is greater than 220 mg/dL, you may take  of your sliding scale (correction) dose of insulin.   HOW TO MANAGE YOUR DIABETES BEFORE AND AFTER SURGERY  Why is it important to control my blood sugar before and after surgery? . Improving blood sugar levels before and after surgery helps healing and can limit problems. . A way of improving blood sugar control is eating a healthy diet by: o  Eating less sugar and carbohydrates o  Increasing activity/exercise o  Talking with your doctor about reaching your blood sugar goals . High blood sugars (greater than 180 mg/dL) can raise your risk of infections and slow your recovery, so you will need to focus on controlling your diabetes during the weeks before surgery. . Make sure that  the doctor who takes care of your diabetes knows about your planned surgery including the date and location.  How do I manage my blood sugar before surgery? . Check your blood sugar at least 4 times a day, starting 2 days before surgery, to make sure that the level is not too high or low. . Check your blood sugar the morning of your surgery when you wake up and every 2 hours until you get to the Short Stay unit. o If your blood sugar is less than 70 mg/dL, you will need to treat for low blood sugar: - Do not take insulin. - Treat a low blood sugar (less than 70 mg/dL) with  cup of clear juice (cranberry or apple), 4 glucose tablets, OR glucose gel. - Recheck blood sugar in 15 minutes after treatment (to make sure it is greater than 70 mg/dL). If your blood sugar is not greater than 70 mg/dL on recheck, call [phone] for further instructions. . Report your blood sugar to the short stay nurse when you get to Short Stay.  . If you are admitted to the hospital after surgery: o Your blood sugar will be checked by the staff and you will probably be given insulin after surgery (instead of oral diabetes medicines) to make sure you have good blood sugar levels. o The goal for blood sugar control after surgery is 80-180 mg/dL.    The Morning of Surgery  Do not wear jewelry, make-up or nail polish.  Do not wear lotions, powders, or perfumes, or deodorant  Do not shave 48 hours prior to surgery.  Do not bring valuables to  the hospital.  [facility] is not responsible for any belongings or valuables.  If you are a smoker, DO NOT Smoke 24 hours prior to surgery  If you wear a CPAP at night please bring your mask the morning of surgery   Remember that you must have someone to transport you home after your surgery, and remain with you for 24 hours if you are discharged the same day.   Please bring cases for contacts, glasses, hearing aids, dentures or bridgework because it cannot be worn into  surgery.    Leave your suitcase in the car.  After surgery it may be brought to your room.  For patients admitted to the hospital, discharge time will be determined by your treatment team.  Patients discharged the day of surgery will not be allowed to drive home.    Special instructions:   [place]- Preparing For Surgery  Before surgery, you can play an important role. Because skin is not sterile, your skin needs to be as free of germs as possible. You can reduce the number of germs on your skin by washing with CHG (chlorahexidine gluconate) Soap before surgery.  CHG is an antiseptic cleaner which kills germs and bonds with the skin to continue killing germs even after washing.    Oral Hygiene is also important to reduce your risk of infection.  Remember - BRUSH YOUR TEETH THE MORNING OF SURGERY WITH YOUR REGULAR TOOTHPASTE  Please do not use if you have an allergy to CHG or antibacterial soaps. If your skin becomes reddened/irritated stop using the CHG.  Do not shave (including legs and underarms) for at least 48 hours prior to first CHG shower. It is OK to shave your face.  Please follow these instructions carefully.   1. Shower the NIGHT BEFORE SURGERY and the MORNING OF SURGERY with CHG Soap.   2. If you chose to wash your hair and body, wash as usual with your normal shampoo and body-wash/soap.  3. Rinse your hair and body thoroughly to remove the shampoo and soap.  4. Apply CHG directly to the skin (ONLY FROM THE NECK DOWN) and wash gently with a scrungie or a clean washcloth.   5. Do not use on open wounds or open sores. Avoid contact with your eyes, ears, mouth and genitals (private parts). Wash Face and genitals (private parts)  with your normal soap.   6. Wash thoroughly, paying special attention to the area where your surgery will be performed.  7. Thoroughly rinse your body with warm water from the neck down.  8. DO NOT shower/wash with your normal soap after using  and rinsing off the CHG Soap.  9. Pat yourself dry with a CLEAN TOWEL.  10. Wear CLEAN PAJAMAS to bed the night before surgery  11. Place CLEAN SHEETS on your bed the night of your first shower and DO NOT SLEEP WITH PETS.  12. Wear comfortable clothes the morning of surgery.     Day of Surgery:  Please shower the morning of surgery with the CHG soap Do not apply any deodorants/lotions. Please wear clean clothes to the hospital/surgery center.   Remember to brush your teeth WITH YOUR REGULAR TOOTHPASTE.   Please read over the following fact sheets that you were given.

## 2020-11-28 NOTE — Progress Notes (Signed)
PCP - Dr. [name] Cardiologist - Dr. Hoyt Koch ([facility] in [place])   Chest x-ray - n/a EKG - 11/28/20 Stress Test - denies ECHO - 08/05/17 Cardiac Cath - denies  Fasting Blood Sugar - 100s Checks Blood Sugar 2 times a day (fasting sugar and 2hrs after dinner - "depending on what I eat my sugars after dinner usually range 130s-140s)   COVID TEST- 11/28/20   Anesthesia review: EKG,  pt has a lap band and mentioned that she usually needs a smaller intubation tube. Pt also mentioned how during a previous surgery "they used a honeycomb bandage and it broke me out in such a bad rash".   Patient denies shortness of breath, fever, cough and chest pain at PAT appointment   All instructions explained to the patient, with a verbal understanding of the material. Patient agrees to go over the instructions while at home for a better understanding. Patient also instructed to self quarantine after being tested for COVID-19. The opportunity to ask questions was provided.

## 2020-11-28 NOTE — Progress Notes (Signed)
[facility] Office Note  Date:  11/29/2020   ID:  Erin Camacho, DOB Nov 12, 1946, MRN [id number]  PCP:  [name], MD   Chief Complaint  Patient presents with  . Follow-up    12 month F/U-No new cardiac concerns.     HPI:  75 year old woman with history of Hypertension Hyperlipidemia Borderline diabetes Non smoker, GERD Anxiety obesity Total hip replacement, 2013 Chronic low back pain 2015, 2017 History of bariatric surgery, LAP band, lost 70 pounds Who presents for follow-up of her shortness of breath with exertion  Last seen in clinic by myself September 2019 Seen by one of our providers October 2020 At that time was evaluated for total hip arthroplasty Was doing well  Breathing stable  Back pain worse, gets spasms in back in overactive Active, "do what I can"  Back surgery on Thursday, Dr. Ellene Route  EKG personally reviewed by myself on todays visit Reviewed from yesterday, NSR, no change  Previous imaging studies reviewed CT scan Atherosclerotic calcification in the aorta and iliacs without aneurysm. Very mild  Carotid ultrasound July 2018 Less than 39% bilaterally  EKG personally reviewed by myself on todays visit Shows normal sinus rhythm rate 59 bpm left anterior fascicular block  Echocardiogram September 2018, normal ejection fraction 60%  PMH:   has a past medical history of Allergy, Anxiety, Arthritis, Chronic back pain, Diabetes mellitus, Dyspnea, GERD (gastroesophageal reflux disease), History of hiatal hernia, Hyperlipidemia, Hypertension, Osteoarthritis, Pneumonia, Spinal stenosis, and UTI (lower urinary tract infection).  PSH:    Past Surgical History:  Procedure Laterality Date  . ABDOMINAL HYSTERECTOMY  1973  . ABDOMINAL HYSTERECTOMY    . APPENDECTOMY    . APPLICATION OF ROBOTIC ASSISTANCE FOR SPINAL PROCEDURE N/A 09/15/2018   Procedure: APPLICATION OF ROBOTIC ASSISTANCE FOR SPINAL PROCEDURE;  Surgeon: Kristeen Miss, MD;  Location: [place];  Service: Neurosurgery;  Laterality: N/A;  . BACK SURGERY  04/17/13, 2017  . CATARACT EXTRACTION W/PHACO Left 12/16/2019   Procedure: CATARACT EXTRACTION PHACO AND INTRAOCULAR LENS PLACEMENT (IOC) LEFT PANOPTIX LENS DIABETIC 8.07 01:19.2 10.2%;  Surgeon: Leandrew Koyanagi, MD;  Location: [place];  Service: Ophthalmology;  Laterality: Left;  Diabetic - oral meds  . CATARACT EXTRACTION W/PHACO Right 01/06/2020   Procedure: CATARACT EXTRACTION PHACO AND INTRAOCULAR LENS PLACEMENT (IOC) PANOPTIX LENS RIGHT DIABETIC 10.73 01:08.6 15.7%;  Surgeon: Leandrew Koyanagi, MD;  Location: [place];  Service: Ophthalmology;  Laterality: Right;  diabetic  . CHOLECYSTECTOMY  1995  . EYE SURGERY  2021   bilateral cataract removal with lens implants  . HERNIA REPAIR  08/2006  . lap band  08/2003  . SEPTOPLASTY  04/1978  . TONSILLECTOMY AND ADENOIDECTOMY  1979  . TOTAL HIP ARTHROPLASTY  02/2012   Right, Dr. Marry Guan  . TOTAL HIP ARTHROPLASTY Left 08/31/2019   Procedure: TOTAL HIP ARTHROPLASTY;  Surgeon: Dereck Leep, MD;  Location: [facility];  Service: Orthopedics;  Laterality: Left;    Current Outpatient Medications  Medication Sig Dispense Refill  . acetaminophen (TYLENOL) 650 MG CR tablet Take 1,950 mg by mouth in the morning and at bedtime.    . ALPRAZolam (XANAX) 0.25 MG tablet Take 0.25 mg by mouth daily as needed for anxiety.    Marland Kitchen amoxicillin (AMOXIL) 500 MG capsule Take 2,000 mg by mouth See admin instructions. Take 4 capsules (2000 mg) by mouth 1 hour prior to dental appointments.    . Calcium Carb-Cholecalciferol (CALCIUM 600+D3 PO) Take 1 tablet by mouth daily.    Marland Kitchen  Camphor-Menthol-Methyl Sal (SALONPAS) 3.11-24-08 % PTCH Place 1 patch onto the skin daily as needed (pain.).    Marland Kitchen cholecalciferol (VITAMIN D) 25 MCG (1000 UNIT) tablet Take 1,000 Units by mouth daily.    . Ferrous Sulfate (IRON) 325 (65 Fe) MG TABS Take 325 mg by mouth daily.    . fexofenadine (ALLEGRA) 180  MG tablet Take 1 tablet (180 mg total) by mouth daily. 90 tablet 4  . fluticasone (FLONASE) 50 MCG/ACT nasal spray SPRAY 2 SPRAYS INTO EACH NOSTRIL EVERY DAY 48 mL 1  . Liniments (SALONPAS ARTHRITIS PAIN RELIEF EX) Apply 1 application topically 3 (three) times daily as needed (pain.).    Marland Kitchen losartan-hydrochlorothiazide (HYZAAR) 50-12.5 MG tablet TAKE 1 TABLET BY MOUTH EVERY DAY 90 tablet 2  . magnesium oxide (MAG-OX) 400 MG tablet Take 1 tablet (400 mg total) by mouth daily. 30 tablet 2  . metFORMIN (GLUCOPHAGE) 500 MG tablet TAKE 1 TABLET BY MOUTH EVERY DAY 90 tablet 1  . metoprolol succinate (TOPROL-XL) 25 MG 24 hr tablet TAKE 1/2 TABLET BY MOUTH EVERY DAY 45 tablet 1  . Multiple Vitamin (MULTIVITAMIN WITH MINERALS) TABS tablet Take 1 tablet by mouth daily.    Glory Rosebush VERIO test strip USE TO CHECK BLOOD SUGAR TWICE A DAY 100 strip 12  . pantoprazole (PROTONIX) 40 MG tablet TAKE 1 TABLET (40 MG TOTAL) BY MOUTH 2 (TWO) TIMES DAILY BEFORE A MEAL. 180 tablet 3  . Polyethyl Glycol-Propyl Glycol (LUBRICANT EYE DROPS) 0.4-0.3 % SOLN Place 1 drop into both eyes 3 (three) times daily as needed (dry/irritated eyes.).    Marland Kitchen sertraline (ZOLOFT) 100 MG tablet TAKE 1/2 TABLET BY MOUTH (50 MG) BY MOUTH DAILY 45 tablet 1  . simvastatin (ZOCOR) 10 MG tablet TAKE 1/2 TABLET (5 MG TOTAL) BY MOUTH AT BEDTIME. 45 tablet 3  . tiZANidine (ZANAFLEX) 2 MG tablet TAKE 1 TABLET (2 MG TOTAL) BY MOUTH 2 (TWO) TIMES DAILY AS NEEDED FOR MUSCLE SPASMS. 60 tablet 1  . traMADol (ULTRAM) 50 MG tablet Take 50 mg by mouth every 12 (twelve) hours as needed.    . traZODone (DESYREL) 100 MG tablet TAKE 1 TABLET BY MOUTH EVERYDAY AT BEDTIME 90 tablet 1   No current facility-administered medications for this visit.     Allergies:   Adhesive [tape]   Social History:  The patient  reports that she has never smoked. She has never used smokeless tobacco. She reports previous alcohol use. She reports that she does not use drugs.    Family History:   family history includes Arthritis in her sister; Breast cancer (age of onset: 58) in an other family member; Cancer in her sister and another family member; Dementia in her sister; Diabetes in her brother, brother, sister, and another family member; Heart disease in her father and mother; Hypertension in her brother, brother, father, and mother; Stroke in her father and mother.    Review of Systems: Review of Systems  Constitutional: Negative.   HENT: Negative.   Respiratory: Negative.   Cardiovascular: Negative.   Gastrointestinal: Negative.   Musculoskeletal: Positive for back pain.  Neurological: Negative.   Psychiatric/Behavioral: Negative.   All other systems reviewed and are negative.   PHYSICAL EXAM: VS:  BP 120/80 (BP Location: Left Arm, Patient Position: Sitting, Cuff Size: Large)   Pulse (!) 59   Ht 5\' 5"  (1.651 m)   Wt 188 lb (85.3 kg)   SpO2 98%   BMI 31.28 kg/m  , BMI Body mass  index is 31.28 kg/m. Constitutional:  oriented to person, place, and time. No distress.  HENT:  Head: Grossly normal Eyes:  no discharge. No scleral icterus.  Neck: No JVD, no carotid bruits  Cardiovascular: Regular rate and rhythm, no murmurs appreciated Pulmonary/Chest: Clear to auscultation bilaterally, no wheezes or rails Abdominal: Soft.  no distension.  no tenderness.  Musculoskeletal: Normal range of motion Neurological:  normal muscle tone. Coordination normal. No atrophy Skin: Skin warm and dry Psychiatric: normal affect, pleasant  Recent Labs: 04/19/2020: TSH 2.24 04/20/2020: Magnesium 1.9 09/12/2020: ALT 15 11/28/2020: BUN 17; Creatinine, Ser 0.94; Hemoglobin 12.7; Platelets 190; Potassium 3.7; Sodium 138    Lipid Panel Lab Results  Component Value Date   CHOL 180 09/12/2020   HDL 66.70 09/12/2020   LDLCALC 90 09/12/2020   TRIG 118.0 09/12/2020      Wt Readings from Last 3 Encounters:  11/29/20 188 lb (85.3 kg)  11/28/20 189 lb 5 oz (85.9 kg)   09/15/20 186 lb (84.4 kg)     ASSESSMENT AND PLAN:  Preop cardiovascular evaluation Acceptable risk for back surgery, no further testing needed  Aortic atherosclerosis (HCC) - Plan: EKG 12-Lead CT scan showing minimal aortic atherosclerosis noted Continue simvastatin Cholesterol reasonable  Shortness of breath - Plan: EKG 12-Lead secondary to deconditioning, obesity Weight continues to run high After back surgery, think about exercise  Essential hypertension - Plan: EKG 12-Lead Blood pressure is well controlled on today's visit. No changes made to the medications. Stable  Hypercholesterolemia Cholesterol is at goal on the current lipid regimen. No changes to the medications were made.  Diabetes mellitus type 2, diet-controlled ([place]) Hemoglobin A1c6 We have encouraged continued exercise, careful diet management in an effort to lose weight.  Morbid  obesity weigth stable, good reange    Total encounter time more than 25 minutes  Greater than 50% was spent in counseling and coordination of care with the patient   No orders of the defined types were placed in this encounter.    Signed, Esmond Plants, M.D., Ph.D. 11/29/2020  [place], [place]

## 2020-11-29 ENCOUNTER — Encounter: Payer: Self-pay | Admitting: Cardiovascular Disease

## 2020-11-29 ENCOUNTER — Ambulatory Visit: Payer: Medicare HMO | Admitting: Cardiovascular Disease

## 2020-11-29 ENCOUNTER — Other Ambulatory Visit: Payer: Self-pay

## 2020-11-29 VITALS — BP 120/80 | HR 59 | Ht 65.0 in | Wt 188.0 lb

## 2020-11-29 DIAGNOSIS — E782 Mixed hyperlipidemia: Secondary | ICD-10-CM | POA: Diagnosis not present

## 2020-11-29 DIAGNOSIS — I10 Essential (primary) hypertension: Secondary | ICD-10-CM | POA: Diagnosis not present

## 2020-11-29 DIAGNOSIS — I700 Atherosclerosis of aorta: Secondary | ICD-10-CM | POA: Diagnosis not present

## 2020-11-29 NOTE — Progress Notes (Signed)
Anesthesia Chart Review:  Case: [id number] Date/Time: 12/12/20 1145   Procedure: Left Thoracic 7-8 Microdiscectomy (Left Back) - 3C/RM 21   Anesthesia type: General   Pre-op diagnosis: Herniated nucleus pulposus with myelopathy, Thoracic   Location: [facility] OR ROOM 19 / [place] [facility]   Surgeons: Kristeen Miss, MD      DISCUSSION: Patient is a 75 year old female scheduled for the above procedure. Case was initially scheduled for 12/01/20, but likely rescheduled given high census related to ongoing COVID-19 pandemic and has been rescheduled to 12/12/20.  History includes never smoker, HTN, HLD, DM2, dyspnea, GERD, hiatal hernia, laparoscopic adjustable gastric band procedure (08/20/03, [facility]; revision of Lap-Band port with hiatal hernia repair 08/21/06), THA (left (08/31/19, right 03/12/12), back surgery (L2-5 PLIF 04/17/13; L5-S1 PLIF 12/06/15; L1-2 PLIF/posteriorlateral arthrodesis T1-L2 09/15/18), cataract extractions (2021). She reported having to remain intubated due to facial swelling following nearly 7 hour spinal fusion 04/17/13. BMI is consistent with obesity.  She had routine follow-up and preoperative evaluation with cardiologist Dr. Rockey Situ on 11/29/20. Followed there for "minimal aortic atherosclerosis" on CT scan (on statin), dyspnea (likely due to deconditioning, obesity), HTN. Weight stable and HTN/hypercholesterolemia felt controlled. He wrote, "Preop cardiovascular evaluation Acceptable risk for back surgery, no further testing needed".  11/28/20 preoperative labs acceptable and COVID-19 testing negative; however, since case has been moved to 12/12/20 then will need an updated COVID test and labs as indicated. She does have a history of gastric lap band.   VS: BP (!) 171/73   Pulse (!) 53   Temp 36.8 C (Oral)   Resp 20   Ht 5\' 5"  (1.651 m)   Wt 85.9 kg   SpO2 100%   BMI 31.50 kg/m    PROVIDERS: [name], MD is PCP [name], MD is cardiologist   LABS: Labs reviewed: Acceptable  for surgery. A1c 5.3% 11/28/20. See DISCUSSION. (all labs ordered are listed, but only abnormal results are displayed)  Labs Reviewed  CBC - Abnormal; Notable for the following components:      Result Value   MCV 101.3 (*)    All other components within normal limits  BASIC METABOLIC PANEL - Abnormal; Notable for the following components:   Glucose, Bld 111 (*)    All other components within normal limits  GLUCOSE, CAPILLARY - Abnormal; Notable for the following components:   Glucose-Capillary 104 (*)    All other components within normal limits  SURGICAL PCR SCREEN  HEMOGLOBIN A1C    IMAGES: MRI T-spine 10/16/20: IMPRESSION: 1. Large disc extrusion at T7-8 with moderate to severe spinal stenosis. 2. Mild to moderate spinal stenosis at other levels due to disc herniations and posterior element hypertrophy. 3. Posterior spinal fusion from T10 into the lumbar spine.   EKG: 11/28/20: Sinus bradycardia at 51 bpm Left axis deviation Left ventricular hypertrophy with QRS widening ( R in aVL , Cornell product ) Abnormal ECG No significant change since last tracing Confirmed by Martinique, Peter [id number]) on 11/29/2020 8:46:43 AM   CV: Echo 08/05/17: Study Conclusions - Left ventricle: The cavity size was mildly dilated. Systolic function was normal. The estimated ejection fraction was in the range of 60% to 65%. Wall motion was normal; there were no regional wall motion abnormalities. Left ventricular diastolic function parameters were normal. - Left atrium: The atrium was mildly dilated. - Right ventricle: Systolic function was normal. - Pulmonary arteries: Systolic pressure was within the normal range. Impressions: - Rhythm is sinus bradycardia, rate 49 bpm.  Carotid  U/S 05/30/17: Impression: 1-39% stenosis of bilateral internal carotid arteries without evidence of hemodynamic significance.  Reported ETT > 5 years ago at [facility].   Past Medical History:   Diagnosis Date  . Allergy    hay fever  . Anxiety   . Arthritis   . Chronic back pain   . Diabetes mellitus   . Dyspnea    a. 07/2017 Echo: EF 60-65%. No rwma. Mildly dil LA. Nl RV fxn.  Marland Kitchen GERD (gastroesophageal reflux disease)   . History of hiatal hernia   . Hyperlipidemia   . Hypertension   . Osteoarthritis   . Pneumonia   . Spinal stenosis   . UTI (lower urinary tract infection)     Past Surgical History:  Procedure Laterality Date  . ABDOMINAL HYSTERECTOMY  1973  . ABDOMINAL HYSTERECTOMY    . APPENDECTOMY    . APPLICATION OF ROBOTIC ASSISTANCE FOR SPINAL PROCEDURE N/A 09/15/2018   Procedure: APPLICATION OF ROBOTIC ASSISTANCE FOR SPINAL PROCEDURE;  Surgeon: Kristeen Miss, MD;  Location: [place];  Service: Neurosurgery;  Laterality: N/A;  . BACK SURGERY  04/17/13, 2017  . CATARACT EXTRACTION W/PHACO Left 12/16/2019   Procedure: CATARACT EXTRACTION PHACO AND INTRAOCULAR LENS PLACEMENT (IOC) LEFT PANOPTIX LENS DIABETIC 8.07 01:19.2 10.2%;  Surgeon: Leandrew Koyanagi, MD;  Location: [place];  Service: Ophthalmology;  Laterality: Left;  Diabetic - oral meds  . CATARACT EXTRACTION W/PHACO Right 01/06/2020   Procedure: CATARACT EXTRACTION PHACO AND INTRAOCULAR LENS PLACEMENT (IOC) PANOPTIX LENS RIGHT DIABETIC 10.73 01:08.6 15.7%;  Surgeon: Leandrew Koyanagi, MD;  Location: [place];  Service: Ophthalmology;  Laterality: Right;  diabetic  . CHOLECYSTECTOMY  1995  . EYE SURGERY  2021   bilateral cataract removal with lens implants  . HERNIA REPAIR  08/2006  . lap band  08/2003  . SEPTOPLASTY  04/1978  . TONSILLECTOMY AND ADENOIDECTOMY  1979  . TOTAL HIP ARTHROPLASTY  02/2012   Right, Dr. Marry Guan  . TOTAL HIP ARTHROPLASTY Left 08/31/2019   Procedure: TOTAL HIP ARTHROPLASTY;  Surgeon: Dereck Leep, MD;  Location: [facility];  Service: Orthopedics;  Laterality: Left;    MEDICATIONS: . acetaminophen (TYLENOL) 650 MG CR tablet  . ALPRAZolam (XANAX) 0.25 MG  tablet  . amoxicillin (AMOXIL) 500 MG capsule  . Calcium Carb-Cholecalciferol (CALCIUM 600+D3 PO)  . Camphor-Menthol-Methyl Sal (SALONPAS) 3.11-24-08 % PTCH  . cholecalciferol (VITAMIN D) 25 MCG (1000 UNIT) tablet  . Ferrous Sulfate (IRON) 325 (65 Fe) MG TABS  . fexofenadine (ALLEGRA) 180 MG tablet  . fluticasone (FLONASE) 50 MCG/ACT nasal spray  . Liniments (SALONPAS ARTHRITIS PAIN RELIEF EX)  . losartan-hydrochlorothiazide (HYZAAR) 50-12.5 MG tablet  . magnesium oxide (MAG-OX) 400 MG tablet  . metFORMIN (GLUCOPHAGE) 500 MG tablet  . metoprolol succinate (TOPROL-XL) 25 MG 24 hr tablet  . Multiple Vitamin (MULTIVITAMIN WITH MINERALS) TABS tablet  . ONETOUCH VERIO test strip  . pantoprazole (PROTONIX) 40 MG tablet  . Polyethyl Glycol-Propyl Glycol (LUBRICANT EYE DROPS) 0.4-0.3 % SOLN  . sertraline (ZOLOFT) 100 MG tablet  . simvastatin (ZOCOR) 10 MG tablet  . tiZANidine (ZANAFLEX) 2 MG tablet  . traMADol (ULTRAM) 50 MG tablet  . traZODone (DESYREL) 100 MG tablet   No current facility-administered medications for this encounter.    Myra Gianotti, PA-C Surgical Short Stay/Anesthesiology [facility] Phone [phone] [facility] Phone [phone] 11/29/2020 3:00 PM

## 2020-11-29 NOTE — Patient Instructions (Addendum)
Medication Instructions:  No changes, please continue your current medications   CAll after surgery with heart rate numbers or place them in your MyChart account for review  If you need a refill on your cardiac medications before your next appointment, please call your pharmacy.    Lab work: None   Testing/Procedures: None   Follow-Up: At [facility], you and your health needs are our priority.  As part of our continuing mission to provide you with exceptional heart care, we have created designated Provider Care Teams.  These Care Teams include your primary Cardiologist (physician) and Advanced Practice Providers (APPs -  Physician Assistants and Nurse Practitioners) who all work together to provide you with the care you need, when you need it.   You will need a follow up appointment in 12 months   Providers on your designated Care Team:    Murray Hodgkins, NP  Christell Faith, PA-C  Marrianne Mood, PA-C  Any Other Special Instructions Will Be Listed Below (If Applicable).  COVID-19 Vaccine Information can be found at: [email or web address].uk For questions related to vaccine distribution or appointments, please email [email or web address] or call [phone].

## 2020-11-29 NOTE — Anesthesia Preprocedure Evaluation (Addendum)
Anesthesia Evaluation  Patient identified by MRN, date of birth, ID band Patient awake    Reviewed: Allergy & Precautions, NPO status , Patient's Chart, lab work & pertinent test results  Airway Mallampati: II  TM Distance: >3 FB Neck ROM: Full    Dental no notable dental hx.    Pulmonary shortness of breath,    Pulmonary exam normal breath sounds clear to auscultation       Cardiovascular hypertension, Normal cardiovascular exam Rhythm:Regular Rate:Normal  Sinus bradycardia Left axis deviation Left ventricular hypertrophy with QRS widening ( R in aVL , Cornell product ) Abnormal ECG No significant change since last tracing Confirmed by Martinique, Peter [phone]) on 11/29/2020 8:46:43 AM   - Left ventricle: The cavity size was mildly dilated. Systolic  function was normal. The estimated ejection fraction was in the  range of 60% to 65%. Wall motion was normal; there were no  regional wall motion abnormalities. Left ventricular diastolic  function parameters were normal.  - Left atrium: The atrium was mildly dilated.  - Right ventricle: Systolic function was normal.  - Pulmonary arteries: Systolic pressure was within the normal  range.    Neuro/Psych PSYCHIATRIC DISORDERS Anxiety Depression  Neuromuscular disease (myofaascial pain)    GI/Hepatic Neg liver ROS, GERD  ,  Endo/Other  diabetes, Type 2, Oral Hypoglycemic Agents  Renal/GU negative Renal ROS  negative genitourinary   Musculoskeletal  (+) Arthritis ,   Abdominal   Peds  Hematology negative hematology ROS (+)   Anesthesia Other Findings   Reproductive/Obstetrics negative OB ROS                            Anesthesia Physical Anesthesia Plan  ASA: III  Anesthesia Plan: General   Post-op Pain Management:    Induction: Intravenous  PONV Risk Score and Plan: 3 and Ondansetron, Dexamethasone and Treatment may vary due  to age or medical condition  Airway Management Planned: Oral ETT  Additional Equipment: None  Intra-op Plan:   Post-operative Plan: Extubation in OR  Informed Consent: I have reviewed the patients History and Physical, chart, labs and discussed the procedure including the risks, benefits and alternatives for the proposed anesthesia with the patient or authorized representative who has indicated his/her understanding and acceptance.     Dental advisory given  Plan Discussed with: Anesthesiologist and CRNA  Anesthesia Plan Comments: (PAT note written by Myra Gianotti, PA-C. )      Anesthesia Quick Evaluation

## 2020-12-08 ENCOUNTER — Inpatient Hospital Stay: Admission: RE | Admit: 2020-12-08 | Payer: Medicare HMO | Source: Ambulatory Visit

## 2020-12-11 ENCOUNTER — Other Ambulatory Visit: Payer: Self-pay | Admitting: Internal Medicine

## 2020-12-13 DIAGNOSIS — J301 Allergic rhinitis due to pollen: Secondary | ICD-10-CM | POA: Diagnosis not present

## 2020-12-20 DIAGNOSIS — J301 Allergic rhinitis due to pollen: Secondary | ICD-10-CM | POA: Diagnosis not present

## 2020-12-22 DIAGNOSIS — B372 Candidiasis of skin and nail: Secondary | ICD-10-CM | POA: Diagnosis not present

## 2020-12-27 ENCOUNTER — Encounter (HOSPITAL_COMMUNITY): Payer: Self-pay | Admitting: Neurological Surgery

## 2020-12-27 ENCOUNTER — Other Ambulatory Visit: Payer: Self-pay

## 2020-12-27 ENCOUNTER — Other Ambulatory Visit
Admission: RE | Admit: 2020-12-27 | Discharge: 2020-12-27 | Disposition: A | Discharge status: Home / Self Care | Payer: Medicare HMO | Source: Ambulatory Visit | Attending: Neurological Surgery | Admitting: Neurological Surgery

## 2020-12-27 DIAGNOSIS — Z01812 Encounter for preprocedural laboratory examination: Secondary | ICD-10-CM | POA: Diagnosis present

## 2020-12-27 DIAGNOSIS — J301 Allergic rhinitis due to pollen: Secondary | ICD-10-CM | POA: Diagnosis not present

## 2020-12-27 DIAGNOSIS — Z20822 Contact with and (suspected) exposure to covid-19: Secondary | ICD-10-CM | POA: Diagnosis not present

## 2020-12-27 NOTE — Progress Notes (Signed)
Denies chest pain or shortness of breath. Most recent cardiology visit in [place]. Educated on visitation on policy. States she will be in quarantine until after surgery.

## 2020-12-28 LAB — SARS CORONAVIRUS 2 (TAT 6-24 HRS): SARS Coronavirus 2: NEGATIVE

## 2020-12-28 NOTE — Progress Notes (Signed)
Anesthesia follow-up: See my Anesthesia APP note signed on 11/29/20. Left T7-8 microdiscectomy rescheduled for 12/29/20 with Kristeen Miss, MD. 12/27/20 presurgical COVID-19 test negative. She is a same day work-up, so updated labs and anesthesia team evaluation on the day of surgery. A1c 5.3% 11/28/20.  Myra Gianotti, PA-C Surgical Short Stay/Anesthesiology [facility] Phone [phone] [facility] Phone [phone] 12/28/2020 10:45 AM

## 2020-12-29 ENCOUNTER — Ambulatory Visit (HOSPITAL_COMMUNITY): Payer: Medicare HMO | Admitting: Vascular Surgery

## 2020-12-29 ENCOUNTER — Encounter (HOSPITAL_COMMUNITY): Payer: Self-pay | Admitting: Neurological Surgery

## 2020-12-29 ENCOUNTER — Observation Stay (HOSPITAL_COMMUNITY)
Admission: RE | Admit: 2020-12-29 | Discharge: 2020-12-30 | Disposition: A | Discharge status: Home / Self Care | Payer: Medicare HMO | Attending: Neurological Surgery | Admitting: Neurological Surgery

## 2020-12-29 ENCOUNTER — Encounter (HOSPITAL_COMMUNITY)
Admission: RE | Disposition: A | Discharge status: Home / Self Care | Payer: Self-pay | Source: Home / Self Care | Attending: Neurological Surgery

## 2020-12-29 ENCOUNTER — Ambulatory Visit (HOSPITAL_COMMUNITY): Payer: Medicare HMO

## 2020-12-29 ENCOUNTER — Other Ambulatory Visit: Payer: Self-pay

## 2020-12-29 DIAGNOSIS — E119 Type 2 diabetes mellitus without complications: Secondary | ICD-10-CM | POA: Insufficient documentation

## 2020-12-29 DIAGNOSIS — Z7984 Long term (current) use of oral hypoglycemic drugs: Secondary | ICD-10-CM | POA: Insufficient documentation

## 2020-12-29 DIAGNOSIS — Z79899 Other long term (current) drug therapy: Secondary | ICD-10-CM | POA: Insufficient documentation

## 2020-12-29 DIAGNOSIS — M5114 Intervertebral disc disorders with radiculopathy, thoracic region: Secondary | ICD-10-CM | POA: Diagnosis not present

## 2020-12-29 DIAGNOSIS — I10 Essential (primary) hypertension: Secondary | ICD-10-CM | POA: Diagnosis not present

## 2020-12-29 DIAGNOSIS — M5104 Intervertebral disc disorders with myelopathy, thoracic region: Secondary | ICD-10-CM | POA: Diagnosis present

## 2020-12-29 DIAGNOSIS — Z981 Arthrodesis status: Secondary | ICD-10-CM | POA: Diagnosis not present

## 2020-12-29 DIAGNOSIS — Z419 Encounter for procedure for purposes other than remedying health state, unspecified: Secondary | ICD-10-CM

## 2020-12-29 HISTORY — DX: Type 2 diabetes mellitus without complications: E11.9

## 2020-12-29 HISTORY — PX: LUMBAR LAMINECTOMY/ DECOMPRESSION WITH MET-RX: SHX5959

## 2020-12-29 LAB — GLUCOSE, CAPILLARY
Glucose-Capillary: 109 mg/dL — ABNORMAL HIGH (ref 70–99)
Glucose-Capillary: 89 mg/dL (ref 70–99)
Glucose-Capillary: 109 mg/dL — ABNORMAL HIGH (ref 70–99)
Glucose-Capillary: 118 mg/dL — ABNORMAL HIGH (ref 70–99)
Glucose-Capillary: 198 mg/dL — ABNORMAL HIGH (ref 70–99)

## 2020-12-29 LAB — CBC
HCT: 41.9 % (ref 36.0–46.0)
Hemoglobin: 13.3 g/dL (ref 12.0–15.0)
MCH: 31.1 pg (ref 26.0–34.0)
MCHC: 31.7 g/dL (ref 30.0–36.0)
MCV: 97.9 fL (ref 80.0–100.0)
Platelets: 177 10*3/uL (ref 150–400)
RBC: 4.28 MIL/uL (ref 3.87–5.11)
RDW: 12.7 % (ref 11.5–15.5)
WBC: 5.1 10*3/uL (ref 4.0–10.5)
nRBC: 0 % (ref 0.0–0.2)

## 2020-12-29 LAB — BASIC METABOLIC PANEL WITH GFR
Anion gap: 11 (ref 5–15)
BUN: 23 mg/dL (ref 8–23)
CO2: 25 mmol/L (ref 22–32)
Calcium: 10 mg/dL (ref 8.9–10.3)
Chloride: 105 mmol/L (ref 98–111)
Creatinine, Ser: 0.86 mg/dL (ref 0.44–1.00)
GFR, Estimated: >60 mL/min
Glucose, Bld: 106 mg/dL — ABNORMAL HIGH (ref 70–99)
Potassium: 3.6 mmol/L (ref 3.5–5.1)
Sodium: 141 mmol/L (ref 135–145)

## 2020-12-29 SURGERY — LUMBAR LAMINECTOMY/ DECOMPRESSION WITH MET-RX
Anesthesia: General | Site: Spine Thoracic | Laterality: Left

## 2020-12-29 MED ORDER — FLUTICASONE PROPIONATE 50 MCG/ACT NA SUSP
1.0000 | Freq: Every day | NASAL | Status: DC
  Filled 2020-12-29: qty 16

## 2020-12-29 MED ORDER — FENTANYL CITRATE (PF) 250 MCG/5ML IJ SOLN
INTRAMUSCULAR | Status: AC
  Filled 2020-12-29: qty 5

## 2020-12-29 MED ORDER — OXYCODONE HCL 5 MG PO TABS
ORAL_TABLET | ORAL | Status: AC
  Filled 2020-12-29: qty 1

## 2020-12-29 MED ORDER — LORATADINE 10 MG PO TABS
10.0000 mg | ORAL_TABLET | Freq: Every day | ORAL | Status: DC
  Administered 2020-12-30: 10 mg via ORAL
  Filled 2020-12-29: qty 1

## 2020-12-29 MED ORDER — ONDANSETRON HCL 4 MG PO TABS: 4.0000 mg | ORAL_TABLET | Freq: Four times a day (QID) | ORAL | Status: DC | PRN

## 2020-12-29 MED ORDER — TRAMADOL HCL 50 MG PO TABS
50.0000 mg | ORAL_TABLET | Freq: Four times a day (QID) | ORAL | Status: DC | PRN
  Administered 2020-12-30: 50 mg via ORAL
  Filled 2020-12-29: qty 1

## 2020-12-29 MED ORDER — WHITE PETROLATUM EX OINT
TOPICAL_OINTMENT | CUTANEOUS | Status: AC
  Filled 2020-12-29: qty 28.35

## 2020-12-29 MED ORDER — LOSARTAN POTASSIUM 50 MG PO TABS
50.0000 mg | ORAL_TABLET | Freq: Every day | ORAL | Status: DC
  Administered 2020-12-29: 50 mg via ORAL
  Filled 2020-12-29 (×2): qty 1

## 2020-12-29 MED ORDER — ONDANSETRON HCL 4 MG/2ML IJ SOLN: 4.0000 mg | Freq: Once | INTRAMUSCULAR | Status: DC | PRN

## 2020-12-29 MED ORDER — ACETAMINOPHEN 650 MG RE SUPP: 650.0000 mg | RECTAL | Status: DC | PRN

## 2020-12-29 MED ORDER — SODIUM CHLORIDE 0.9% FLUSH: 3.0000 mL | INTRAVENOUS | Status: DC | PRN

## 2020-12-29 MED ORDER — TIZANIDINE HCL 4 MG PO TABS
4.0000 mg | ORAL_TABLET | Freq: Three times a day (TID) | ORAL | Status: DC | PRN
  Administered 2020-12-29: 4 mg via ORAL
  Filled 2020-12-29: qty 1

## 2020-12-29 MED ORDER — ONDANSETRON HCL 4 MG/2ML IJ SOLN: 4.0000 mg | Freq: Four times a day (QID) | INTRAMUSCULAR | Status: DC | PRN

## 2020-12-29 MED ORDER — DEXAMETHASONE SODIUM PHOSPHATE 10 MG/ML IJ SOLN
INTRAMUSCULAR | Status: DC | PRN
  Administered 2020-12-29: 5 mg via INTRAVENOUS

## 2020-12-29 MED ORDER — SERTRALINE HCL 50 MG PO TABS
25.0000 mg | ORAL_TABLET | Freq: Every day | ORAL | Status: DC
  Administered 2020-12-30: 25 mg via ORAL
  Filled 2020-12-29: qty 1

## 2020-12-29 MED ORDER — OXYCODONE HCL 5 MG PO TABS
5.0000 mg | ORAL_TABLET | Freq: Once | ORAL | Status: AC | PRN
  Administered 2020-12-29: 5 mg via ORAL

## 2020-12-29 MED ORDER — METHOCARBAMOL 1000 MG/10ML IJ SOLN
500.0000 mg | Freq: Four times a day (QID) | INTRAVENOUS | Status: DC | PRN
  Filled 2020-12-29: qty 5

## 2020-12-29 MED ORDER — POLYETHYLENE GLYCOL 3350 17 G PO PACK: 17.0000 g | PACK | Freq: Every day | ORAL | Status: DC | PRN

## 2020-12-29 MED ORDER — SIMVASTATIN 5 MG PO TABS
5.0000 mg | ORAL_TABLET | Freq: Every day | ORAL | Status: DC
  Filled 2020-12-29: qty 1

## 2020-12-29 MED ORDER — HYDROMORPHONE HCL 1 MG/ML IJ SOLN
INTRAMUSCULAR | Status: AC
  Filled 2020-12-29: qty 1

## 2020-12-29 MED ORDER — LABETALOL HCL 5 MG/ML IV SOLN
INTRAVENOUS | Status: AC
  Filled 2020-12-29: qty 4

## 2020-12-29 MED ORDER — METHOCARBAMOL 500 MG PO TABS
500.0000 mg | ORAL_TABLET | Freq: Four times a day (QID) | ORAL | Status: DC | PRN
  Administered 2020-12-29 – 2020-12-30 (×3): 500 mg via ORAL
  Filled 2020-12-29 (×2): qty 1

## 2020-12-29 MED ORDER — LIDOCAINE-EPINEPHRINE 1 %-1:100000 IJ SOLN
INTRAMUSCULAR | Status: DC | PRN
  Administered 2020-12-29: 5 mL

## 2020-12-29 MED ORDER — LACTATED RINGERS IV SOLN: INTRAVENOUS | Status: DC

## 2020-12-29 MED ORDER — THROMBIN 5000 UNITS EX SOLR: OROMUCOSAL | Status: DC | PRN

## 2020-12-29 MED ORDER — METOPROLOL SUCCINATE ER 25 MG PO TB24
12.5000 mg | ORAL_TABLET | Freq: Every day | ORAL | Status: DC
  Administered 2020-12-30: 12.5 mg via ORAL
  Filled 2020-12-29: qty 1

## 2020-12-29 MED ORDER — MENTHOL 3 MG MT LOZG
1.0000 | LOZENGE | OROMUCOSAL | Status: DC | PRN
  Filled 2020-12-29: qty 9

## 2020-12-29 MED ORDER — AMISULPRIDE (ANTIEMETIC) 5 MG/2ML IV SOLN: 10.0000 mg | Freq: Once | INTRAVENOUS | Status: DC | PRN

## 2020-12-29 MED ORDER — ONDANSETRON HCL 4 MG/2ML IJ SOLN
INTRAMUSCULAR | Status: DC | PRN
  Administered 2020-12-29: 4 mg via INTRAVENOUS

## 2020-12-29 MED ORDER — FLEET ENEMA 7-19 GM/118ML RE ENEM: 1.0000 | ENEMA | Freq: Once | RECTAL | Status: DC | PRN

## 2020-12-29 MED ORDER — 0.9 % SODIUM CHLORIDE (POUR BTL) OPTIME
TOPICAL | Status: DC | PRN
  Administered 2020-12-29: 1000 mL

## 2020-12-29 MED ORDER — OXYCODONE HCL 5 MG/5ML PO SOLN: 5.0000 mg | Freq: Once | ORAL | Status: AC | PRN

## 2020-12-29 MED ORDER — MORPHINE SULFATE (PF) 2 MG/ML IV SOLN: 2.0000 mg | INTRAVENOUS | Status: DC | PRN

## 2020-12-29 MED ORDER — EPHEDRINE SULFATE 50 MG/ML IJ SOLN
INTRAMUSCULAR | Status: DC | PRN
  Administered 2020-12-29: 5 mg via INTRAVENOUS

## 2020-12-29 MED ORDER — PHENOL 1.4 % MT LIQD: 1.0000 | OROMUCOSAL | Status: DC | PRN

## 2020-12-29 MED ORDER — PROPOFOL 10 MG/ML IV BOLUS
INTRAVENOUS | Status: DC | PRN
  Administered 2020-12-29: 150 mg via INTRAVENOUS

## 2020-12-29 MED ORDER — SUGAMMADEX SODIUM 200 MG/2ML IV SOLN
INTRAVENOUS | Status: DC | PRN
  Administered 2020-12-29: 160 mg via INTRAVENOUS

## 2020-12-29 MED ORDER — SENNA 8.6 MG PO TABS
1.0000 | ORAL_TABLET | Freq: Two times a day (BID) | ORAL | Status: DC
  Administered 2020-12-29 – 2020-12-30 (×2): 8.6 mg via ORAL
  Filled 2020-12-29 (×2): qty 1

## 2020-12-29 MED ORDER — BUPIVACAINE HCL (PF) 0.5 % IJ SOLN
INTRAMUSCULAR | Status: AC
  Filled 2020-12-29: qty 30

## 2020-12-29 MED ORDER — POLYETHYL GLYCOL-PROPYL GLYCOL 0.4-0.3 % OP SOLN: 1.0000 [drp] | Freq: Three times a day (TID) | OPHTHALMIC | Status: DC | PRN

## 2020-12-29 MED ORDER — DOCUSATE SODIUM 100 MG PO CAPS
100.0000 mg | ORAL_CAPSULE | Freq: Two times a day (BID) | ORAL | Status: DC
  Administered 2020-12-29 – 2020-12-30 (×2): 100 mg via ORAL
  Filled 2020-12-29: qty 1

## 2020-12-29 MED ORDER — HYDROCHLOROTHIAZIDE 12.5 MG PO CAPS
12.5000 mg | ORAL_CAPSULE | Freq: Every day | ORAL | Status: DC
  Filled 2020-12-29: qty 1

## 2020-12-29 MED ORDER — CHLORHEXIDINE GLUCONATE 0.12 % MT SOLN
15.0000 mL | Freq: Once | OROMUCOSAL | Status: AC
  Administered 2020-12-29: 15 mL via OROMUCOSAL
  Filled 2020-12-29: qty 15

## 2020-12-29 MED ORDER — CHLORHEXIDINE GLUCONATE CLOTH 2 % EX PADS: 6.0000 | MEDICATED_PAD | Freq: Once | CUTANEOUS | Status: DC

## 2020-12-29 MED ORDER — METFORMIN HCL 500 MG PO TABS
500.0000 mg | ORAL_TABLET | Freq: Every day | ORAL | Status: DC
  Filled 2020-12-29: qty 1

## 2020-12-29 MED ORDER — ALUM & MAG HYDROXIDE-SIMETH 200-200-20 MG/5ML PO SUSP: 30.0000 mL | Freq: Four times a day (QID) | ORAL | Status: DC | PRN

## 2020-12-29 MED ORDER — FENTANYL CITRATE (PF) 100 MCG/2ML IJ SOLN: 25.0000 ug | INTRAMUSCULAR | Status: DC | PRN

## 2020-12-29 MED ORDER — LABETALOL HCL 5 MG/ML IV SOLN
5.0000 mg | Freq: Once | INTRAVENOUS | Status: AC
  Administered 2020-12-29: 5 mg via INTRAVENOUS

## 2020-12-29 MED ORDER — LOSARTAN POTASSIUM-HCTZ 50-12.5 MG PO TABS: 1.0000 | ORAL_TABLET | Freq: Every day | ORAL | Status: DC

## 2020-12-29 MED ORDER — SODIUM CHLORIDE 0.9 % IV SOLN: 250.0000 mL | INTRAVENOUS | Status: DC

## 2020-12-29 MED ORDER — HYDROMORPHONE HCL 1 MG/ML IJ SOLN
0.2500 mg | INTRAMUSCULAR | Status: DC | PRN
  Administered 2020-12-29 (×2): 0.25 mg via INTRAVENOUS
  Administered 2020-12-29 (×2): 0.5 mg via INTRAVENOUS

## 2020-12-29 MED ORDER — THROMBIN 5000 UNITS EX SOLR
CUTANEOUS | Status: AC
  Filled 2020-12-29: qty 5000

## 2020-12-29 MED ORDER — LIDOCAINE-EPINEPHRINE 1 %-1:100000 IJ SOLN
INTRAMUSCULAR | Status: AC
  Filled 2020-12-29: qty 1

## 2020-12-29 MED ORDER — PHENYLEPHRINE 40 MCG/ML (10ML) SYRINGE FOR IV PUSH (FOR BLOOD PRESSURE SUPPORT)
PREFILLED_SYRINGE | INTRAVENOUS | Status: DC | PRN
  Administered 2020-12-29 (×3): 80 ug via INTRAVENOUS

## 2020-12-29 MED ORDER — CEFAZOLIN SODIUM-DEXTROSE 2-4 GM/100ML-% IV SOLN
2.0000 g | INTRAVENOUS | Status: AC
  Administered 2020-12-29: 2 g via INTRAVENOUS
  Filled 2020-12-29: qty 100

## 2020-12-29 MED ORDER — ROCURONIUM BROMIDE 10 MG/ML (PF) SYRINGE
PREFILLED_SYRINGE | INTRAVENOUS | Status: DC | PRN
  Administered 2020-12-29: 10 mg via INTRAVENOUS
  Administered 2020-12-29: 70 mg via INTRAVENOUS

## 2020-12-29 MED ORDER — ALPRAZOLAM 0.25 MG PO TABS: 0.2500 mg | ORAL_TABLET | Freq: Every day | ORAL | Status: DC | PRN

## 2020-12-29 MED ORDER — BUPIVACAINE HCL (PF) 0.5 % IJ SOLN
INTRAMUSCULAR | Status: DC | PRN
  Administered 2020-12-29: 20 mL
  Administered 2020-12-29: 5 mL

## 2020-12-29 MED ORDER — LIDOCAINE 2% (20 MG/ML) 5 ML SYRINGE
INTRAMUSCULAR | Status: DC | PRN
  Administered 2020-12-29: 80 mg via INTRAVENOUS

## 2020-12-29 MED ORDER — POLYVINYL ALCOHOL 1.4 % OP SOLN
1.0000 [drp] | Freq: Three times a day (TID) | OPHTHALMIC | Status: DC | PRN
  Filled 2020-12-29: qty 15

## 2020-12-29 MED ORDER — SODIUM CHLORIDE 0.9% FLUSH: 3.0000 mL | Freq: Two times a day (BID) | INTRAVENOUS | Status: DC

## 2020-12-29 MED ORDER — ACETAMINOPHEN 325 MG PO TABS: 650.0000 mg | ORAL_TABLET | ORAL | Status: DC | PRN

## 2020-12-29 MED ORDER — CEFAZOLIN SODIUM-DEXTROSE 2-4 GM/100ML-% IV SOLN
2.0000 g | Freq: Three times a day (TID) | INTRAVENOUS | Status: AC
  Administered 2020-12-29 – 2020-12-30 (×2): 2 g via INTRAVENOUS
  Filled 2020-12-29 (×2): qty 100

## 2020-12-29 MED ORDER — ORAL CARE MOUTH RINSE: 15.0000 mL | Freq: Once | OROMUCOSAL | Status: AC

## 2020-12-29 MED ORDER — PANTOPRAZOLE SODIUM 40 MG PO TBEC
40.0000 mg | DELAYED_RELEASE_TABLET | Freq: Two times a day (BID) | ORAL | Status: DC
  Administered 2020-12-29 – 2020-12-30 (×2): 40 mg via ORAL
  Filled 2020-12-29 (×2): qty 1

## 2020-12-29 MED ORDER — OXYCODONE-ACETAMINOPHEN 5-325 MG PO TABS
1.0000 | ORAL_TABLET | Freq: Four times a day (QID) | ORAL | Status: DC | PRN
  Administered 2020-12-29 – 2020-12-30 (×3): 2 via ORAL
  Filled 2020-12-29 (×3): qty 2

## 2020-12-29 MED ORDER — BISACODYL 10 MG RE SUPP: 10.0000 mg | Freq: Every day | RECTAL | Status: DC | PRN

## 2020-12-29 MED ORDER — METHOCARBAMOL 500 MG PO TABS
ORAL_TABLET | ORAL | Status: AC
  Filled 2020-12-29: qty 1

## 2020-12-29 SURGICAL SUPPLY — 46 items
ADH SKN CLS APL DERMABOND .7 (GAUZE/BANDAGES/DRESSINGS) ×1
BAND INSRT 18 STRL LF DISP RB (MISCELLANEOUS) ×2
BAND RUBBER #18 3X1/16 STRL (MISCELLANEOUS) ×4
BLADE CLIPPER SURG (BLADE)
BLADE SURG 15 STRL LF DISP TIS (BLADE) ×1
BLADE SURG 15 STRL SS (BLADE) ×2
BUR 2.5 MTCH HD 16 (BUR) ×2
BUR PRECISION MATCH 2.5 (BURR) ×2
CANISTER SUCT 3000ML PPV (MISCELLANEOUS) ×2
COVER WAND RF STERILE (DRAPES)
DECANTER SPIKE VIAL GLASS SM (MISCELLANEOUS) ×2
DERMABOND ADVANCED (GAUZE/BANDAGES/DRESSINGS) ×1
DERMABOND ADVANCED .7 DNX12 (GAUZE/BANDAGES/DRESSINGS) ×1
DRAPE C-ARM 42X72 X-RAY (DRAPES) ×4
DRAPE LAPAROTOMY 100X72X124 (DRAPES) ×2
DRAPE MICROSCOPE LEICA (MISCELLANEOUS) ×2
DURAPREP 6ML APPLICATOR 50/CS (WOUND CARE) ×2
ELECT BLADE 6.5 EXT (BLADE) ×2
ELECT REM PT RETURN 9FT ADLT (ELECTROSURGICAL) ×2
GAUZE 4X4 16PLY RFD (DISPOSABLE)
GLOVE BIOGEL PI IND STRL 8.5 (GLOVE) ×1
GLOVE BIOGEL PI INDICATOR 8.5 (GLOVE) ×1
GLOVE ECLIPSE 8.5 STRL (GLOVE) ×2
GLOVE EXAM NITRILE XL STR (GLOVE)
GLOVE SURG UNDER POLY LF SZ6.5 (GLOVE) ×2
GOWN STRL REUS W/ TWL LRG LVL3 (GOWN DISPOSABLE) ×1
GOWN STRL REUS W/ TWL XL LVL3 (GOWN DISPOSABLE)
GOWN STRL REUS W/TWL 2XL LVL3 (GOWN DISPOSABLE) ×2
GOWN STRL REUS W/TWL LRG LVL3 (GOWN DISPOSABLE) ×2
GOWN STRL REUS W/TWL XL LVL3 (GOWN DISPOSABLE)
HEMOSTAT POWDER KIT SURGIFOAM (HEMOSTASIS) ×2
KIT BASIN OR (CUSTOM PROCEDURE TRAY) ×2
KIT TURNOVER KIT B (KITS) ×2
NEEDLE HYPO 18GX1.5 BLUNT FILL (NEEDLE)
NEEDLE HYPO 22GX1.5 SAFETY (NEEDLE) ×2
NEEDLE SPNL 20GX3.5 QUINCKE YW (NEEDLE) ×2
NS IRRIG 1000ML POUR BTL (IV SOLUTION) ×2
PACK LAMINECTOMY NEURO (CUSTOM PROCEDURE TRAY) ×2
PAD ARMBOARD 7.5X6 YLW CONV (MISCELLANEOUS) ×10
SPONGE SURGIFOAM ABS GEL SZ50 (HEMOSTASIS)
SUT VIC AB 3-0 SH 8-18 (SUTURE) ×2
SUT VIC AB 4-0 RB1 18 (SUTURE) ×2
SYR 5ML LL (SYRINGE)
TOWEL GREEN STERILE (TOWEL DISPOSABLE) ×2
TOWEL GREEN STERILE FF (TOWEL DISPOSABLE) ×2
WATER STERILE IRR 1000ML POUR (IV SOLUTION) ×2

## 2020-12-29 NOTE — Transfer of Care (Signed)
Immediate Anesthesia Transfer of Care Note  Patient: Erin Camacho  Procedure(s) Performed: Left Thoracic Seven-Eight Microdiscectomy (Left Spine Thoracic)  Patient Location: PACU  Anesthesia Type:General  Level of Consciousness: drowsy  Airway & Oxygen Therapy: Patient Spontanous Breathing and Patient connected to nasal cannula oxygen  Post-op Assessment: Report given to RN and Post -op Vital signs reviewed and stable  Post vital signs: Reviewed and stable  Last Vitals:  Vitals Value Taken Time  BP    Temp    Pulse 88 12/29/20 1645  Resp 19 12/29/20 1645  SpO2 100 % 12/29/20 1645  Vitals shown include unvalidated device data.  Last Pain:  Vitals:   12/29/20 1202  TempSrc:   PainSc: 3       Patients Stated Pain Goal: 3 (92/23/00 9794)  Complications: No complications documented.

## 2020-12-29 NOTE — Progress Notes (Signed)
Neurosurgery  Called by nursing re: puffiness at incision.  Pt seen and examined. Small amount of swelling at incision site which was quite soft and nontender. The nurses have been applying some ice to it. No discoloration or drainage seen. Neurologically, the patient states she is better than prior to surgery. I reassured the patient that her wound was not concerning at this point.

## 2020-12-29 NOTE — Anesthesia Procedure Notes (Signed)
Procedure Name: Intubation Date/Time: 12/29/2020 2:48 PM Performed by: Josephine Igo, CRNA Pre-anesthesia Checklist: Patient identified, Emergency Drugs available, Suction available, Patient being monitored and Timeout performed Patient Re-evaluated:Patient Re-evaluated prior to induction Oxygen Delivery Method: Circle system utilized Preoxygenation: Pre-oxygenation with 100% oxygen Induction Type: IV induction Ventilation: Oral airway inserted - appropriate to patient size and Mask ventilation without difficulty Laryngoscope Size: Miller and 2 Grade View: Grade I Tube type: Oral Tube size: 7.0 mm Number of attempts: 1 Airway Equipment and Method: Stylet Placement Confirmation: ETT inserted through vocal cords under direct vision,  positive ETCO2 and breath sounds checked- equal and bilateral Secured at: 23 cm Tube secured with: Tape Dental Injury: Teeth and Oropharynx as per pre-operative assessment

## 2020-12-29 NOTE — H&P (Signed)
Erin Camacho is an 75 y.o. female.   Chief Complaint: Midthoracic back pain with radiation into the lower extremities wide-based gait difficulty with ambulation HPI: Erin Camacho is a 75 year old individual whose had extensive lumbar spine surgery with decompression and fusion from T10 to the sacrum.  She has had increasing thoracic back pain and a recent work-up disclosed a large herniated disc at the level of T7-T8 eccentric to the left side with elevation and compression of the spinal cord there is also evidence of dilation of her central spinal canal below the level of compressive lesion as she has been noting difficulties with her gait and the back pain with radiation around to either side I have advised surgical decompression of this large disc herniation at the T7-T8 level and she is now admitted for this procedure.  Past Medical History:  Diagnosis Date  . Allergy    hay fever  . Anxiety   . Arthritis   . Chronic back pain   . Diabetes mellitus   . Dyspnea    a. 07/2017 Echo: EF 60-65%. No rwma. Mildly dil LA. Nl RV fxn.  Marland Kitchen GERD (gastroesophageal reflux disease)   . History of hiatal hernia   . Hyperlipidemia   . Hypertension   . Osteoarthritis   . Pneumonia   . Spinal stenosis   . Type 2 diabetes mellitus ([place])   . UTI (lower urinary tract infection)     Past Surgical History:  Procedure Laterality Date  . ABDOMINAL HYSTERECTOMY  1973  . ABDOMINAL HYSTERECTOMY    . APPENDECTOMY    . APPLICATION OF ROBOTIC ASSISTANCE FOR SPINAL PROCEDURE N/A 09/15/2018   Procedure: APPLICATION OF ROBOTIC ASSISTANCE FOR SPINAL PROCEDURE;  Surgeon: Kristeen Miss, MD;  Location: [facility];  Service: Neurosurgery;  Laterality: N/A;  . BACK SURGERY  04/17/13, 2017, 2019  . CATARACT EXTRACTION W/PHACO Left 12/16/2019   Procedure: CATARACT EXTRACTION PHACO AND INTRAOCULAR LENS PLACEMENT (IOC) LEFT PANOPTIX LENS DIABETIC 8.07 01:19.2 10.2%;  Surgeon: Leandrew Koyanagi, MD;  Location: [facility];   Service: Ophthalmology;  Laterality: Left;  Diabetic - oral meds  . CATARACT EXTRACTION W/PHACO Right 01/06/2020   Procedure: CATARACT EXTRACTION PHACO AND INTRAOCULAR LENS PLACEMENT (IOC) PANOPTIX LENS RIGHT DIABETIC 10.73 01:08.6 15.7%;  Surgeon: Leandrew Koyanagi, MD;  Location: [facility];  Service: Ophthalmology;  Laterality: Right;  diabetic  . CHOLECYSTECTOMY  1995  . EYE SURGERY  2021   bilateral cataract removal with lens implants  . HERNIA REPAIR  08/2006  . lap band  08/2003  . SEPTOPLASTY  04/1978  . TONSILLECTOMY AND ADENOIDECTOMY  1979  . TOTAL HIP ARTHROPLASTY  02/2012   Right, Dr. Marry Guan  . TOTAL HIP ARTHROPLASTY Left 08/31/2019   Procedure: TOTAL HIP ARTHROPLASTY;  Surgeon: Dereck Leep, MD;  Location: [facility];  Service: Orthopedics;  Laterality: Left;    Family History  Problem Relation Age of Onset  . Hypertension Mother   . Stroke Mother   . Heart disease Mother   . Hypertension Father   . Stroke Father   . Heart disease Father   . Arthritis Sister   . Diabetes Sister   . Dementia Sister   . Cancer Sister   . Diabetes Brother   . Hypertension Brother   . Diabetes Brother   . Hypertension Brother   . Breast cancer Other 47  . Cancer Other        breast  . Diabetes Other   . Kidney disease  Neg Hx   . Bladder Cancer Neg Hx    Social History:  reports that she has never smoked. She has never used smokeless tobacco. She reports previous alcohol use. She reports that she does not use drugs.  Allergies:  Allergies  Allergen Reactions  . Adhesive [Tape] Itching    Honeycomb bandage - redness/itching    Medications Prior to Admission  Medication Sig Dispense Refill  . acetaminophen (TYLENOL) 650 MG CR tablet Take 1,950 mg by mouth in the morning and at bedtime.    . ALPRAZolam (XANAX) 0.25 MG tablet Take 0.25 mg by mouth daily as needed for anxiety.    . Calcium Carb-Cholecalciferol (CALCIUM 600+D3 PO) Take 1 tablet by mouth daily.    .  Camphor-Menthol-Methyl Sal (SALONPAS) 3.11-24-08 % PTCH Place 1 patch onto the skin daily as needed (pain.).    Marland Kitchen cholecalciferol (VITAMIN D) 25 MCG (1000 UNIT) tablet Take 1,000 Units by mouth daily.    . Ferrous Sulfate (IRON) 325 (65 Fe) MG TABS Take 325 mg by mouth daily.    . fexofenadine (ALLEGRA) 180 MG tablet Take 1 tablet (180 mg total) by mouth daily. 90 tablet 4  . fluticasone (FLONASE) 50 MCG/ACT nasal spray SPRAY 2 SPRAYS INTO EACH NOSTRIL EVERY DAY 48 mL 1  . Liniments (SALONPAS ARTHRITIS PAIN RELIEF EX) Apply 1 application topically 3 (three) times daily as needed (pain.).    Marland Kitchen losartan-hydrochlorothiazide (HYZAAR) 50-12.5 MG tablet TAKE 1 TABLET BY MOUTH EVERY DAY 90 tablet 2  . magnesium oxide (MAG-OX) 400 MG tablet TAKE 1 TABLET BY MOUTH EVERY DAY 30 tablet 2  . metFORMIN (GLUCOPHAGE) 500 MG tablet TAKE 1 TABLET BY MOUTH EVERY DAY 90 tablet 1  . metoprolol succinate (TOPROL-XL) 25 MG 24 hr tablet TAKE 1/2 TABLET BY MOUTH EVERY DAY 45 tablet 1  . Multiple Vitamin (MULTIVITAMIN WITH MINERALS) TABS tablet Take 1 tablet by mouth daily.    . pantoprazole (PROTONIX) 40 MG tablet TAKE 1 TABLET (40 MG TOTAL) BY MOUTH 2 (TWO) TIMES DAILY BEFORE A MEAL. 180 tablet 3  . sertraline (ZOLOFT) 100 MG tablet TAKE 1/2 TABLET BY MOUTH (50 MG) BY MOUTH DAILY 45 tablet 1  . simvastatin (ZOCOR) 10 MG tablet TAKE 1/2 TABLET (5 MG TOTAL) BY MOUTH AT BEDTIME. 45 tablet 3  . tiZANidine (ZANAFLEX) 2 MG tablet TAKE 1 TABLET (2 MG TOTAL) BY MOUTH 2 (TWO) TIMES DAILY AS NEEDED FOR MUSCLE SPASMS. 60 tablet 1  . traMADol (ULTRAM) 50 MG tablet Take 50 mg by mouth every 12 (twelve) hours as needed.    . traZODone (DESYREL) 100 MG tablet TAKE 1 TABLET BY MOUTH EVERYDAY AT BEDTIME 90 tablet 1  . amoxicillin (AMOXIL) 500 MG capsule Take 2,000 mg by mouth See admin instructions. Take 4 capsules (2000 mg) by mouth 1 hour prior to dental appointments.    Glory Rosebush VERIO test strip USE TO CHECK BLOOD SUGAR TWICE A DAY  100 strip 12  . Polyethyl Glycol-Propyl Glycol (LUBRICANT EYE DROPS) 0.4-0.3 % SOLN Place 1 drop into both eyes 3 (three) times daily as needed (dry/irritated eyes.).      Results for orders placed or performed during the hospital encounter of 12/29/20 (from the past 48 hour(s))  Glucose, capillary     Status: Abnormal   Collection Time: 12/29/20 11:28 AM  Result Value Ref Range   Glucose-Capillary 109 (H) 70 - 99 mg/dL    Comment: Glucose reference range applies only to samples taken after fasting  for at least 8 hours.   Comment 1 Notify RN   CBC     Status: None   Collection Time: 12/29/20 11:55 AM  Result Value Ref Range   WBC 5.1 4.0 - 10.5 K/uL   RBC 4.28 3.87 - 5.11 MIL/uL   Hemoglobin 13.3 12.0 - 15.0 g/dL   HCT 41.9 36.0 - 46.0 %   MCV 97.9 80.0 - 100.0 fL   MCH 31.1 26.0 - 34.0 pg   MCHC 31.7 30.0 - 36.0 g/dL   RDW 12.7 11.5 - 15.5 %   Platelets 177 150 - 400 K/uL   nRBC 0.0 0.0 - 0.2 %    Comment: Performed at [facility], [place] [address] [place], [place] [address]  Basic metabolic panel     Status: Abnormal   Collection Time: 12/29/20 11:55 AM  Result Value Ref Range   Sodium 141 135 - 145 mmol/L   Potassium 3.6 3.5 - 5.1 mmol/L   Chloride 105 98 - 111 mmol/L   CO2 25 22 - 32 mmol/L   Glucose, Bld 106 (H) 70 - 99 mg/dL    Comment: Glucose reference range applies only to samples taken after fasting for at least 8 hours.   BUN 23 8 - 23 mg/dL   Creatinine, Ser 0.86 0.44 - 1.00 mg/dL   Calcium 10.0 8.9 - 10.3 mg/dL   GFR, Estimated >60 >60 mL/min    Comment: (NOTE) Calculated using the CKD-EPI Creatinine Equation (2021)    Anion gap 11 5 - 15    Comment: Performed at [facility] [address], [place], [place] [address]  Glucose, capillary     Status: None   Collection Time: 12/29/20  1:37 PM  Result Value Ref Range   Glucose-Capillary 89 70 - 99 mg/dL    Comment: Glucose reference range applies only to samples taken after fasting for at least 8  hours.   No results found.  Review of Systems  Constitutional: Positive for activity change.  HENT: Negative.   Eyes: Negative.   Respiratory: Negative.   Cardiovascular: Negative.   Gastrointestinal: Negative.   Endocrine: Negative.   Genitourinary: Negative.   Musculoskeletal: Positive for back pain and myalgias.  Neurological: Positive for weakness and numbness.  Hematological: Negative.   Psychiatric/Behavioral: Negative.     Blood pressure (!) 176/62, pulse 68, temperature 98.2 F (36.8 C), temperature source Oral, resp. rate 18, height 5\' 5"  (1.651 m), weight 85.3 kg, SpO2 98 %. Physical Exam Constitutional:      Appearance: Normal appearance.  HENT:     Head: Normocephalic and atraumatic.     Nose: Nose normal.     Mouth/Throat:     Mouth: Mucous membranes are moist.  Eyes:     Extraocular Movements: Extraocular movements intact.     Pupils: Pupils are equal, round, and reactive to light.  Cardiovascular:     Rate and Rhythm: Normal rate and regular rhythm.     Pulses: Normal pulses.     Heart sounds: Normal heart sounds.  Pulmonary:     Effort: Pulmonary effort is normal.     Breath sounds: Normal breath sounds.  Abdominal:     General: Abdomen is flat. Bowel sounds are normal.     Palpations: Abdomen is soft.  Musculoskeletal:     Cervical back: Normal range of motion and neck supple.  Skin:    General: Skin is warm and dry.     Capillary Refill: Capillary  refill takes less than 2 seconds.  Neurological:     Mental Status: She is alert.     Comments: Alert and oriented patient moves upper extremities well lower extremities demonstrate some modest weakness with difficulty with normal station and a wide-based gait.  There is hyperreflexia in the patellae absent reflexes in the Achilles and strength appears to be 5 out of 5 in the major muscle groups of the lower extremities.  Upper extremity strength is normal as are the reflexes.  Radial nerve examination is  normal.  Psychiatric:        Mood and Affect: Mood normal.        Behavior: Behavior normal.        Thought Content: Thought content normal.        Judgment: Judgment normal.      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  Earleen Newport, MD 12/29/2020, 2:25 PM

## 2020-12-29 NOTE — Op Note (Signed)
Date of surgery: December 29, 2020 Preoperative diagnosis: Herniated nucleus pulposus T7-T8 with myelopathy Postoperative diagnosis: Same Procedure: Left hemilaminectomy and facetectomy T7-T8 with far lateral approach and discectomy T7-T8 for decompression of spinal canal.  Metrix retractor.  Operating microscope.  Surgeon: Kristeen Miss, MD First Assistant: Duffy Rhody, MD Anesthesia: General endotracheal Indications: [name] is been a 75 year old individual who is significant back problems in several stages she has ultimately had decompression and fusion from T10 down to the sacrum.  She has developed some increasing back pain and more less weakness in the lower extremities and an MRI demonstrated presence of a large disc herniation at T7-T8 level causing cord compression.  She is advised regarding the need for surgical decompression.  Procedure: Patient was brought to the operating room supine on the stretcher.  After the smooth induction of general tracheal anesthesia she was carefully turned into the prone position.  The back was prepped with alcohol DuraPrep in the thoracic spine and prior to prepping localizing fluoroscopy was performed to localize the T7-T8 area and a far lateral approach to expose the entirety of the facet joint the hemilamina and the pedicle.  After prepping this area procedure was started by making an incision in the lateral aspect it was chosen a K wire was passed to the facet joint at the T7-T8 level.  Then a small vertical incision was created over the K wire and a series of dilators was passed to open this to the 20 mm size.  Through 20 mm x 5 cm Metrix tube the operating microscope was brought into view and the superficial fascia was cauterized.  A drill was used to remove the inferior lamina of T7 and the superior articular process of T8 until the epidural space was identified.  L ligament was taken up with a 2 and 3 mm Kerrison punch and the side of the dura was  decompressed on the left side.  The facet joint was removed to allow good visualization lateral aspect of the dura and it was noted at this point that the dura was elevated in this region this was in the region of the disc itself and by gradually opening this area further were able to expose the thickened and dorsally displaced ligament.  This was incised with a 15 blade and then using a small micro rongeur we were able to remove fragments of disc from this region as decompression continued were able to move more and more disc that was firmly attached to some ligamentous overgrowth.  A discrete hole in the posterior longitudinal ligament was not identified in the end we removed a considerable amount of disc from the left side of the canal that was elevating the dura and with this a good decompression was noted after probing with blunt probe several times he felt that a good decompression was obtained surgery was done with the help of Dr. Cathie Hoops who inspected the area also and concurred at the completion of the discectomy.  With this hemostasis in the soft tissues was obtained meticulously Metrix retractor was removed and the microscope was removed hemostasis was checked and the wound and the wound was closed with the fascia being closed with 3-0 Vicryl in the subcu cutaneous layer was also closed in addition to final subcuticular skin closure Dermabond was placed on the skin blood loss for the procedure was estimated at 25 cc.

## 2020-12-30 ENCOUNTER — Encounter (HOSPITAL_COMMUNITY): Payer: Self-pay | Admitting: Neurological Surgery

## 2020-12-30 DIAGNOSIS — E119 Type 2 diabetes mellitus without complications: Secondary | ICD-10-CM | POA: Diagnosis not present

## 2020-12-30 DIAGNOSIS — I10 Essential (primary) hypertension: Secondary | ICD-10-CM | POA: Diagnosis not present

## 2020-12-30 DIAGNOSIS — Z79899 Other long term (current) drug therapy: Secondary | ICD-10-CM | POA: Diagnosis not present

## 2020-12-30 DIAGNOSIS — M5104 Intervertebral disc disorders with myelopathy, thoracic region: Secondary | ICD-10-CM | POA: Diagnosis not present

## 2020-12-30 DIAGNOSIS — Z7984 Long term (current) use of oral hypoglycemic drugs: Secondary | ICD-10-CM | POA: Diagnosis not present

## 2020-12-30 LAB — GLUCOSE, CAPILLARY: Glucose-Capillary: 176 mg/dL — ABNORMAL HIGH (ref 70–99)

## 2020-12-30 MED ORDER — METHOCARBAMOL 500 MG PO TABS: 500.0000 mg | ORAL_TABLET | Freq: Four times a day (QID) | ORAL | 3 refills | Status: DC | PRN

## 2020-12-30 MED ORDER — OXYCODONE-ACETAMINOPHEN 5-325 MG PO TABS: 1.0000 | ORAL_TABLET | Freq: Four times a day (QID) | ORAL | Status: DC | PRN

## 2020-12-30 MED ORDER — OXYCODONE-ACETAMINOPHEN 5-325 MG PO TABS: 1.0000 | ORAL_TABLET | Freq: Four times a day (QID) | ORAL | 0 refills | Status: DC | PRN

## 2020-12-30 NOTE — Progress Notes (Signed)
Patient is discharged from room 3C06 at this time. Alert and in stable condition. IV site d/c'd and instructions read to patient and spouse with understanding verbalized and all questions answered. Left unit via wheelchair with all belongings at side 
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
## 2020-12-30 NOTE — Discharge Instructions (Signed)

## 2020-12-30 NOTE — Evaluation (Signed)
Occupational Therapy Evaluation Patient Details Name: Erin Camacho MRN: [id number] DOB: 1946/10/30 Today's Date: 12/30/2020    History of Present Illness 75 y.o. female presenting with herniated nucleus pulposus T7-T8 with myelopathy s/p laminectomy and facetectomy for decompression. 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   Clinical Impression   PTA patient was living in a single-level private residence with her spouse and was independent with ADLs/IADLs with intermittent use of SPC. Patient reports hand-held assist from her husband in community dwellings. Patient currently presents near baseline level of function demonstrating supervision A to Mod I grossly for observed ADLs including bathing/dressing, toileting, and ADL transfers with use of RW. OT provided education on spinal precautions, home set-up to maximize safety and independence with self-care tasks, and acquisition/use of AE. Patient expressed verbal understanding. Patient would benefit from one more follow-up OT treatment session in prep for safe d/c home with family. OT will continue to follow acutely.       Follow Up Recommendations  No OT follow up;Supervision - Intermittent    Equipment Recommendations  3 in 1 bedside commode    Recommendations for Other Services       Precautions / Restrictions Precautions Precautions: Back Precaution Booklet Issued: Yes (comment) Precaution Comments: Written handout provided. Patient able to recall 3/3 back precautions from previous surgery. Good adherence during ADLs. Required Braces or Orthoses:  (No brace needed) Restrictions Weight Bearing Restrictions: No      Mobility Bed Mobility Overal bed mobility: Modified Independent                  Transfers Overall transfer level: Needs assistance Equipment used: 1 person hand held assist;Rolling walker (2 wheeled) Transfers: Sit to/from Stand Sit to Stand: Min guard;Supervision          General transfer comment: Sit to stand from EOB without AD and Min guard. Supervision A for sit to stand from standard commode to RW.    Balance Overall balance assessment: Mild deficits observed, not formally tested                                         ADL either performed or assessed with clinical judgement   ADL Overall ADL's : Needs assistance/impaired     Grooming: Supervision/safety;Standing Grooming Details (indicate cue type and reason): 3/3 grooming tasks standing at  sink surface. Upper Body Bathing: Supervision/ safety;Standing Upper Body Bathing Details (indicate cue type and reason): Supervision A standing at sink level. Lower Body Bathing: Supervison/ safety;Sit to/from stand Lower Body Bathing Details (indicate cue type and reason): Patient able to wash front perineal area and buttocks in standing at sink level with supervision A for safety. Upper Body Dressing : Set up;Sitting Upper Body Dressing Details (indicate cue type and reason): Donned UB clothing in sitting. No external assist required. Lower Body Dressing: Minimal assistance;Sit to/from stand Lower Body Dressing Details (indicate cue type and reason): Min A to thread LLE through underwear/pants. Patient has reacher at home but reports preference for husbands assist if needed. Toilet Transfer: Consulting civil engineer Details (indicate cue type and reason): Supervision A for safety. Toileting- Clothing Manipulation and Hygiene: Supervision/safety;Sit to/from stand       Functional mobility during ADLs: Min guard;Supervision/safety;Rolling walker General ADL Comments: Min guard and HHA without AD and supervision A with use of RW.  Vision Baseline Vision/History: No visual deficits (s/p cataracts removal) Patient Visual Report: No change from baseline Vision Assessment?: No apparent visual deficits     Perception     Praxis      Pertinent Vitals/Pain Pain  Assessment: 0-10 Pain Score: 2  Pain Location: Back (incisional) Pain Descriptors / Indicators: Aching Pain Intervention(s): Limited activity within patient's tolerance;Monitored during session;Repositioned;Premedicated before session     Hand Dominance Right   Extremity/Trunk Assessment Upper Extremity Assessment Upper Extremity Assessment: Overall WFL for tasks assessed   Lower Extremity Assessment Lower Extremity Assessment: Defer to PT evaluation   Cervical / Trunk Assessment Cervical / Trunk Assessment: Other exceptions Cervical / Trunk Exceptions: s/p spinal surgery   Communication Communication Communication: No difficulties   Cognition Arousal/Alertness: Awake/alert Behavior During Therapy: WFL for tasks assessed/performed Overall Cognitive Status: Within Functional Limits for tasks assessed                                     General Comments  Incision open to RA.    Exercises     Shoulder Instructions      Home Living Family/patient expects to be discharged to:: Private residence Living Arrangements: Spouse/significant other Available Help at Discharge: Family;Available 24 hours/day Type of Home: House Home Access: Stairs to enter [employer] of Steps: 1 Entrance Stairs-Rails: None Home Layout: One level     Bathroom Shower/Tub: Occupational psychologist: Handicapped height     Home Equipment: Adaptive equipment;Walker - 2 wheels;Walker - 4 wheels;Cane - single point;Shower seat - built in;Grab bars - tub/shower;Hand held Architectural technologist: Reacher        Prior Functioning/Environment Level of Independence: Independent with assistive device(s)        Comments: Intermittent use of SPC in home. Patient reports occasional HHA from husband in community dwellings.        OT Problem List: Pain      OT Treatment/Interventions: Self-care/ADL training;Therapeutic exercise;DME and/or AE  instruction;Therapeutic activities;Patient/family education    OT Goals(Current goals can be found in the care plan section) Acute Rehab OT Goals Patient Stated Goal: To return home. OT Goal Formulation: With patient Time For Goal Achievement: 01/13/21 Potential to Achieve Goals: Good ADL Goals Additional ADL Goal #1: Patient will demonstrate all A.M. ADLs with Mod I, good adherence to back precautions and use of LRAD.  OT Frequency: Other (comment) (1 more follow-up OT treatment session)   Barriers to D/C:            Co-evaluation              AM-PAC OT "6 Clicks" Daily Activity     Outcome Measure Help from another person eating meals?: None Help from another person taking care of personal grooming?: A Little Help from another person toileting, which includes using toliet, bedpan, or urinal?: A Little Help from another person bathing (including washing, rinsing, drying)?: A Little Help from another person to put on and taking off regular upper body clothing?: None Help from another person to put on and taking off regular lower body clothing?: A Little 6 Click Score: 20   End of Session Equipment Utilized During Treatment: Rolling walker;Back brace Nurse Communication: Mobility status  Activity Tolerance: Patient tolerated treatment well Patient left: in chair;with call bell/phone within reach  OT Visit Diagnosis: Pain Pain - Right/Left:  (bilateral) Pain - part of body:  (back (  incision))                Time: 9810-2548 OT Time Calculation (min): 26 min Charges:  OT General Charges $OT Visit: 1 Visit OT Evaluation $OT Eval Low Complexity: 1 Low OT Treatments $Self Care/Home Management : 8-22 mins  Bora Broner H. OTR/L Supplemental OT, Department of rehab services [phone]  Daneya Hartgrove R H. 12/30/2020, 9:53 AM

## 2020-12-30 NOTE — Anesthesia Postprocedure Evaluation (Signed)
Anesthesia Post Note  Patient: Erin Camacho  Procedure(s) Performed: Left Thoracic Seven-Eight Microdiscectomy (Left Spine Thoracic)     Patient location during evaluation: PACU Anesthesia Type: General Level of consciousness: awake and alert Pain management: pain level controlled Vital Signs Assessment: post-procedure vital signs reviewed and stable Respiratory status: spontaneous breathing, nonlabored ventilation, respiratory function stable and patient connected to nasal cannula oxygen Cardiovascular status: blood pressure returned to baseline and stable Postop Assessment: no apparent nausea or vomiting Anesthetic complications: no   No complications documented.  Last Vitals:  Vitals:   12/29/20 2131 12/29/20 2340  BP: (!) 179/74 (!) 156/72  Pulse: 62 72  Resp: 18 20  Temp: (!) 36.4 C (!) 36.3 C  SpO2: 99% 96%    Last Pain:  Vitals:   12/30/20 0628  TempSrc:   PainSc: Asleep                 Faigy Stretch COKER

## 2021-01-02 ENCOUNTER — Emergency Department (HOSPITAL_COMMUNITY)
Admission: EM | Admit: 2021-01-02 | Discharge: 2021-01-02 | Disposition: A | Discharge status: Home / Self Care | Payer: Medicare HMO | Attending: Emergency Medicine | Admitting: Emergency Medicine

## 2021-01-02 ENCOUNTER — Encounter: Payer: Self-pay | Admitting: Internal Medicine

## 2021-01-02 ENCOUNTER — Encounter (HOSPITAL_COMMUNITY): Payer: Self-pay | Admitting: Emergency Medicine

## 2021-01-02 DIAGNOSIS — N3001 Acute cystitis with hematuria: Secondary | ICD-10-CM | POA: Diagnosis not present

## 2021-01-02 DIAGNOSIS — R339 Retention of urine, unspecified: Secondary | ICD-10-CM | POA: Diagnosis not present

## 2021-01-02 DIAGNOSIS — Z79899 Other long term (current) drug therapy: Secondary | ICD-10-CM | POA: Insufficient documentation

## 2021-01-02 DIAGNOSIS — Z7984 Long term (current) use of oral hypoglycemic drugs: Secondary | ICD-10-CM | POA: Diagnosis not present

## 2021-01-02 DIAGNOSIS — E119 Type 2 diabetes mellitus without complications: Secondary | ICD-10-CM | POA: Insufficient documentation

## 2021-01-02 DIAGNOSIS — G8918 Other acute postprocedural pain: Secondary | ICD-10-CM | POA: Diagnosis not present

## 2021-01-02 DIAGNOSIS — I10 Essential (primary) hypertension: Secondary | ICD-10-CM | POA: Insufficient documentation

## 2021-01-02 LAB — CBC WITH DIFFERENTIAL/PLATELET
Abs Immature Granulocytes: 0.02 10*3/uL (ref 0.00–0.07)
Basophils Absolute: 0 10*3/uL (ref 0.0–0.1)
Basophils Relative: 1 %
Eosinophils Absolute: 0.4 10*3/uL (ref 0.0–0.5)
Eosinophils Relative: 6 %
HCT: 42.9 % (ref 36.0–46.0)
Hemoglobin: 13.4 g/dL (ref 12.0–15.0)
Immature Granulocytes: 0 %
Lymphocytes Relative: 18 %
Lymphs Abs: 1.4 10*3/uL (ref 0.7–4.0)
MCH: 31.2 pg (ref 26.0–34.0)
MCHC: 31.2 g/dL (ref 30.0–36.0)
MCV: 100 fL (ref 80.0–100.0)
Monocytes Absolute: 0.6 10*3/uL (ref 0.1–1.0)
Monocytes Relative: 8 %
Neutro Abs: 5 10*3/uL (ref 1.7–7.7)
Neutrophils Relative %: 67 %
Platelets: 182 10*3/uL (ref 150–400)
RBC: 4.29 MIL/uL (ref 3.87–5.11)
RDW: 12.4 % (ref 11.5–15.5)
WBC: 7.4 10*3/uL (ref 4.0–10.5)
nRBC: 0 % (ref 0.0–0.2)

## 2021-01-02 LAB — URINALYSIS, ROUTINE W REFLEX MICROSCOPIC
Bilirubin Urine: NEGATIVE
Glucose, UA: NEGATIVE mg/dL
Ketones, ur: NEGATIVE mg/dL
Nitrite: NEGATIVE
Protein, ur: NEGATIVE mg/dL
Specific Gravity, Urine: 1.014 (ref 1.005–1.030)
pH: 6 (ref 5.0–8.0)

## 2021-01-02 LAB — BASIC METABOLIC PANEL WITH GFR
Anion gap: 10 (ref 5–15)
BUN: 14 mg/dL (ref 8–23)
CO2: 31 mmol/L (ref 22–32)
Calcium: 10.5 mg/dL — ABNORMAL HIGH (ref 8.9–10.3)
Chloride: 99 mmol/L (ref 98–111)
Creatinine, Ser: 0.87 mg/dL (ref 0.44–1.00)
GFR, Estimated: >60 mL/min
Glucose, Bld: 142 mg/dL — ABNORMAL HIGH (ref 70–99)
Potassium: 3.7 mmol/L (ref 3.5–5.1)
Sodium: 140 mmol/L (ref 135–145)

## 2021-01-02 MED ORDER — HYDROMORPHONE HCL 1 MG/ML IJ SOLN
1.0000 mg | Freq: Once | INTRAMUSCULAR | Status: AC
  Administered 2021-01-02: 1 mg via INTRAVENOUS
  Filled 2021-01-02: qty 1

## 2021-01-02 MED ORDER — CEPHALEXIN 500 MG PO CAPS: 500.0000 mg | ORAL_CAPSULE | Freq: Four times a day (QID) | ORAL | 0 refills | Status: DC

## 2021-01-02 NOTE — Discharge Instructions (Addendum)
You were seen in the emergency department today for problems urinating and back pain.  You seem to be retaining urine therefore a Foley catheter was put in.  This will remain in place until you are able to follow-up with urology, we have given the office phone number, call today to schedule appointment within the next 3 days.    Your labs show that your calcium blood sugar were mildly elevated, please have these rechecked by your primary care provider.  Your labs also show that your urine is somewhat infected -we are starting you on an antibiotic for this, Keflex, please take this 4 times per day for the next 7 days.   We have prescribed you new medication(s) today. Discuss the medications prescribed today with your pharmacist as they can have adverse effects and interactions with your other medicines including over the counter and prescribed medications. Seek medical evaluation if you start to experience new or abnormal symptoms after taking one of these medicines, seek care immediately if you start to experience difficulty breathing, feeling of your throat closing, facial swelling, or rash as these could be indications of a more serious allergic reaction  Please call your neurosurgeon's office today to let them know about your ER visit to schedule close follow-up.  Please follow-up with urology as discussed above.  Return to the ER for new or worsening symptoms including but not limited to fever, vomiting, increased pain, numbness, weakness, loss of control of your bowel function, trouble walking, new or worsening pain, chest pain, trouble breathing, or any other concerns.

## 2021-01-02 NOTE — Telephone Encounter (Signed)
Thank her for the update.  She does need to let her surgeon know of the increased pain, etc.  Also, want to confirm, does she still have the catheter in place.  Does she have f/u with urology or someone to remove catheter .

## 2021-01-02 NOTE — ED Notes (Signed)
Post void - 455ml per bladder scan.

## 2021-01-02 NOTE — ED Provider Notes (Signed)
[facility] EMERGENCY DEPARTMENT Provider Note   CSN: [id number] Arrival date & time: 01/02/21  4163     History Chief Complaint  Patient presents with  . Urinary Retention  . Back Pain    Erin Camacho is a 75 y.o. female with a history of hypertension, hyperlipidemia, T2DM & recent back surgery who presents to the ED with complaints of difficulty urinating x 2 days. Patient states she is having difficulty urinating, states that she is having some frequency and when she goes to the bathroom she feels like she is not fully emptying her bladder, she has had some mild dysuria as well.  No alleviating or aggravating factors to this.  She states that she had this problem with a prior back surgery several years ago and did require catheter placement.  This feels similar.  She is also having some increased pain to her surgical site area described as a burning as well as a deep discomfort, when she takes her Percocet and her Robaxin and this helps some, she has not had these medications since 2100 last night.  She thinks she might of had a fever on Friday (2/11), this was the day she was discharged from the hospital.  She has some nausea when she has increased pain but no vomiting and otherwise no nausea. She has not had a BM post operatively but has not felt she has needed to.  She has been ambulatory.  She denies numbness, weakness, incontinence to bowel or bladder, saddle anesthesia, abdominal pain, hematuria, chest pain, or dyspnea.   Patient had recent surgical procedure 12/29/2020-left hemilaminectomy and facetectomy T7-T8 with far lateral approach and discectomy T7-T8 for of decompression of spinal canal- performed by neurosurgeon Dr. Ellene Route.   HPI     Past Medical History:  Diagnosis Date  . Allergy    hay fever  . Anxiety   . Arthritis   . Chronic back pain   . Diabetes mellitus   . Dyspnea    a. 07/2017 Echo: EF 60-65%. No rwma. Mildly dil LA. Nl RV fxn.  Marland Kitchen GERD  (gastroesophageal reflux disease)   . History of hiatal hernia   . Hyperlipidemia   . Hypertension   . Osteoarthritis   . Pneumonia   . Spinal stenosis   . Type 2 diabetes mellitus ([place])   . UTI (lower urinary tract infection)     Patient Active Problem List   Diagnosis Date Noted  . Herniated nucleus pulposus with myelopathy, thoracic 12/29/2020  . Low back pain 09/15/2020  . Uncomplicated opioid dependence ([place]) 05/16/2020  . Opioid use (10 MME/day) 05/16/2020  . Hypokalemia 04/28/2020  . Chronic pain syndrome 04/20/2020  . Pharmacologic therapy 04/20/2020  . Disorder of skeletal system 04/20/2020  . Problems influencing health status 04/20/2020  . Chronic thoracic spine pain (Bilateral) (R>L) 04/20/2020  . Myofascial pain syndrome of thoracic spine 04/20/2020  . Chronic musculoskeletal pain 04/20/2020  . Spasm of thoracic back muscle 04/20/2020  . Weight loss 12/31/2019  . History of total hip replacement (Left) 08/31/2019  . Early satiety 07/05/2019  . Lumbar stenosis with neurogenic claudication 09/15/2018  . Shortness of breath 06/19/2018  . Cough 04/13/2018  . Aortic atherosclerosis ([place]) 01/11/2018  . GERD (gastroesophageal reflux disease) 12/23/2016  . Atrophic vaginitis 03/18/2016  . Spondylolisthesis at L5-S1 level 12/06/2015  . Rhinitis, allergic 11/22/2015  . Other abnormal glucose 11/29/2014  . Right leg pain 07/27/2014  . Neuritis or radiculitis due to rupture of  lumbar intervertebral disc 06/28/2014  . Chronic shoulder pain (Bilateral) 02/24/2014  . Skin lesion 01/18/2014  . History of bariatric surgery 01/18/2014  . Status post bariatric surgery 01/18/2014  . 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 Mild depression ([place]) 05/14/2013  . Major depressive disorder, single episode 05/14/2013  . Healthcare maintenance 01/07/2013  . Hypercholesterolemia 08/22/2012  . Hyperlipidemia 08/22/2012  . Hypertension 07/07/2012  . Diabetes mellitus type 2,  diet-controlled ([place]) 07/07/2012  . Insomnia 07/07/2012  . Obesity (BMI 30-39.9) 07/07/2012  . Essential (primary) hypertension 07/07/2012  . Chronic low back pain (1ry area of Pain) (Bilateral) (R>L) w/o sciatica 10/18/2011    Past Surgical History:  Procedure Laterality Date  . ABDOMINAL HYSTERECTOMY  1973  . ABDOMINAL HYSTERECTOMY    . APPENDECTOMY    . APPLICATION OF ROBOTIC ASSISTANCE FOR SPINAL PROCEDURE N/A 09/15/2018   Procedure: APPLICATION OF ROBOTIC ASSISTANCE FOR SPINAL PROCEDURE;  Surgeon: Kristeen Miss, MD;  Location: [facility];  Service: Neurosurgery;  Laterality: N/A;  . BACK SURGERY  04/17/13, 2017, 2019  . CATARACT EXTRACTION W/PHACO Left 12/16/2019   Procedure: CATARACT EXTRACTION PHACO AND INTRAOCULAR LENS PLACEMENT (IOC) LEFT PANOPTIX LENS DIABETIC 8.07 01:19.2 10.2%;  Surgeon: Leandrew Koyanagi, MD;  Location: [facility];  Service: Ophthalmology;  Laterality: Left;  Diabetic - oral meds  . CATARACT EXTRACTION W/PHACO Right 01/06/2020   Procedure: CATARACT EXTRACTION PHACO AND INTRAOCULAR LENS PLACEMENT (IOC) PANOPTIX LENS RIGHT DIABETIC 10.73 01:08.6 15.7%;  Surgeon: Leandrew Koyanagi, MD;  Location: [facility];  Service: Ophthalmology;  Laterality: Right;  diabetic  . CHOLECYSTECTOMY  1995  . EYE SURGERY  2021   bilateral cataract removal with lens implants  . HERNIA REPAIR  08/2006  . lap band  08/2003  . LUMBAR LAMINECTOMY/ DECOMPRESSION WITH MET-RX Left 12/29/2020   Procedure: Left Thoracic Seven-Eight Microdiscectomy;  Surgeon: Kristeen Miss, MD;  Location: [facility];  Service: Neurosurgery;  Laterality: Left;  . SEPTOPLASTY  04/1978  . TONSILLECTOMY AND ADENOIDECTOMY  1979  . TOTAL HIP ARTHROPLASTY  02/2012   Right, Dr. Marry Guan  . TOTAL HIP ARTHROPLASTY Left 08/31/2019   Procedure: TOTAL HIP ARTHROPLASTY;  Surgeon: Dereck Leep, MD;  Location: [facility];  Service: Orthopedics;  Laterality: Left;     OB History   No obstetric history on  file.     Family History  Problem Relation Age of Onset  . Hypertension Mother   . Stroke Mother   . Heart disease Mother   . Hypertension Father   . Stroke Father   . Heart disease Father   . Arthritis Sister   . Diabetes Sister   . Dementia Sister   . Cancer Sister   . Diabetes Brother   . Hypertension Brother   . Diabetes Brother   . Hypertension Brother   . Breast cancer Other 47  . Cancer Other        breast  . Diabetes Other   . Kidney disease Neg Hx   . Bladder Cancer Neg Hx     Social History   Tobacco Use  . Smoking status: Never Smoker  . Smokeless tobacco: Never Used  Vaping Use  . Vaping Use: Never used  Substance Use Topics  . Alcohol use: Not Currently    Alcohol/week: 0.0 - 1.0 standard drinks    Comment: occ  . Drug use: No    Home Medications Prior to Admission medications   Medication Sig Start Date End Date Taking? Authorizing Provider  acetaminophen (TYLENOL) 650 MG CR tablet Take 1,950 mg by mouth in the morning and at bedtime.    [provider]  ALPRAZolam Duanne Moron) 0.25 MG tablet Take 0.25 mg by mouth daily as needed for anxiety.    [provider]  amoxicillin (AMOXIL) 500 MG capsule Take 2,000 mg by mouth See admin instructions. Take 4 capsules (2000 mg) by mouth 1 hour prior to dental appointments.    [provider]  Calcium Carb-Cholecalciferol (CALCIUM 600+D3 PO) Take 1 tablet by mouth daily.    [provider]  Camphor-Menthol-Methyl Sal (SALONPAS) 3.11-24-08 % PTCH Place 1 patch onto the skin daily as needed (pain.).    [provider]  cholecalciferol (VITAMIN D) 25 MCG (1000 UNIT) tablet Take 1,000 Units by mouth daily.    [provider]  Ferrous Sulfate (IRON) 325 (65 Fe) MG TABS Take 325 mg by mouth daily. 01/28/20   [provider]  fexofenadine (ALLEGRA) 180 MG tablet Take 1 tablet (180 mg total) by mouth daily. 01/18/14   Jackolyn Confer, MD  fluticasone (FLONASE) 50  MCG/ACT nasal spray SPRAY 2 SPRAYS INTO EACH NOSTRIL EVERY DAY 07/07/19   [name], MD  [facility] ARTHRITIS PAIN RELIEF EX) Apply 1 application topically 3 (three) times daily as needed (pain.).    [provider]  losartan-hydrochlorothiazide (HYZAAR) 50-12.5 MG tablet TAKE 1 TABLET BY MOUTH EVERY DAY 06/06/20   [name], MD  magnesium oxide (MAG-OX) 400 MG tablet TAKE 1 TABLET BY MOUTH EVERY DAY 12/12/20   [name], MD  metFORMIN (GLUCOPHAGE) 500 MG tablet TAKE 1 TABLET BY MOUTH EVERY DAY 10/17/20   [name], MD  methocarbamol (ROBAXIN) 500 MG tablet Take 1 tablet (500 mg total) by mouth every 6 (six) hours as needed for muscle spasms. 12/30/20   Kristeen Miss, MD  metoprolol succinate (TOPROL-XL) 25 MG 24 hr tablet TAKE 1/2 TABLET BY MOUTH EVERY DAY 09/09/20   [name], MD  Multiple Vitamin (MULTIVITAMIN WITH MINERALS) TABS tablet Take 1 tablet by mouth daily.    [provider]  [facility] VERIO test strip USE TO CHECK BLOOD SUGAR TWICE A DAY 10/03/20   [name], MD  oxyCODONE-acetaminophen (PERCOCET/ROXICET) 5-325 MG tablet Take 1-2 tablets by mouth every 6 (six) hours as needed for severe pain. 12/30/20   Kristeen Miss, MD  pantoprazole (PROTONIX) 40 MG tablet TAKE 1 TABLET (40 MG TOTAL) BY MOUTH 2 (TWO) TIMES DAILY BEFORE A MEAL. 02/19/20   [name], MD  Polyethyl Glycol-Propyl Glycol (LUBRICANT EYE DROPS) 0.4-0.3 % SOLN Place 1 drop into both eyes 3 (three) times daily as needed (dry/irritated eyes.).    [provider]  sertraline (ZOLOFT) 100 MG tablet TAKE 1/2 TABLET BY MOUTH (50 MG) BY MOUTH DAILY 10/03/20   Crecencio Mc, MD  simvastatin (ZOCOR) 10 MG tablet TAKE 1/2 TABLET (5 MG TOTAL) BY MOUTH AT BEDTIME. 04/11/20   [name], MD  tiZANidine (ZANAFLEX) 2 MG tablet TAKE 1 TABLET (2 MG TOTAL) BY MOUTH 2 (TWO) TIMES DAILY AS NEEDED FOR MUSCLE SPASMS. 11/09/20   [name], MD  traMADol (ULTRAM) 50 MG  tablet Take 50 mg by mouth every 12 (twelve) hours as needed.    [provider]  traZODone (DESYREL) 100 MG tablet TAKE 1 TABLET BY MOUTH EVERYDAY AT BEDTIME 08/29/20   [name], MD    Allergies    Adhesive [tape]  Review of Systems   Review of Systems  Constitutional: Positive for  fever (Subjective 2 days ago, none since).  Respiratory: Negative for cough and shortness of breath.   Cardiovascular: Negative for chest pain.  Gastrointestinal: Positive for nausea (w/ increased pain ). Negative for abdominal pain and vomiting.  Genitourinary: Positive for difficulty urinating, dysuria and frequency.  Neurological: Negative for syncope, weakness and numbness.       Negative for incontinence or saddle anesthesia.  All other systems reviewed and are negative.   Physical Exam Updated Vital Signs BP (!) 141/74 (BP Location: Right Arm)   Pulse 77   Temp 98.2 F (36.8 C) (Oral)   Resp 18   SpO2 98%   Physical Exam Vitals and nursing note reviewed.  Constitutional:      General: She is not in acute distress.    Appearance: She is well-developed. She is not toxic-appearing.  HENT:     Head: Normocephalic and atraumatic.  Eyes:     General:        Right eye: No discharge.        Left eye: No discharge.     Conjunctiva/sclera: Conjunctivae normal.  Cardiovascular:     Rate and Rhythm: Normal rate and regular rhythm.     Comments: 2+ symmetric DP pulses bilaterally. Pulmonary:     Effort: Pulmonary effort is normal. No respiratory distress.     Breath sounds: Normal breath sounds. No wheezing, rhonchi or rales.  Abdominal:     General: There is no distension.     Palpations: Abdomen is soft.     Tenderness: There is no abdominal tenderness. There is no guarding or rebound.  Musculoskeletal:     Cervical back: Neck supple.     Comments: Back: Patient has surgical incision site with what appears to be Dermabond in place to the left lower thoracic region just  lateral to midline.  There is surrounding erythema/irritation noted as pictured below. Not overly hot to the touch compared to surrounding nonerythematous skin. There is no significant induration or fluctuance.  There is no purulent drainage.  Patient is tender over her surgical incision site.  Otherwise nontender.  No point/focal vertebral tenderness. Lower extremities: Able to actively range all major joints.  No focal bony tenderness.  Skin:    General: Skin is warm and dry.     Findings: No rash.  Neurological:     Mental Status: She is alert.     Comments: Clear speech.  Sensation grossly intact bilateral lower extremities.  5 out of 5 symmetric strength with plantar and dorsiflexion bilaterally.  Psychiatric:        Behavior: Behavior normal.      ED Results / Procedures / Treatments   Labs (all labs ordered are listed, but only abnormal results are displayed) Labs Reviewed  URINALYSIS, ROUTINE W REFLEX MICROSCOPIC - Abnormal; Notable for the following components:      Result Value   APPearance HAZY (*)    Hgb urine dipstick SMALL (*)    Leukocytes,Ua LARGE (*)    Bacteria, UA RARE (*)    Non Squamous Epithelial 0-5 (*)    All other components within normal limits  BASIC METABOLIC PANEL - Abnormal; Notable for the following components:   Glucose, Bld 142 (*)    Calcium 10.5 (*)    All other components within normal limits  URINE CULTURE  CBC WITH DIFFERENTIAL/PLATELET    EKG None  Radiology No results found.  Procedures Procedures   Medications Ordered in ED Medications  HYDROmorphone (DILAUDID) injection 1  mg (1 mg Intravenous Given 01/02/21 0458)    ED Course  I have reviewed the triage vital signs and the nursing notes.  Pertinent labs & imaging results that were available during my care of the patient were reviewed by me and considered in my medical decision making (see chart for details).    MDM Rules/Calculators/A&P                          Patient  presents to the ED with complaints of difficulty urinating as well as back discomfort, she is status post thoracic surgical procedure 12/29/20.  She is nontoxic, her blood pressure somewhat elevated, otherwise vitals unremarkable.  A rectal temp was obtained given she is postop and did report a potential fever few days ago, she is afebrile in the emergency department with rectal temp and has not had antipyretics in 8 hours which is reassuring.  Additional history obtained:  Additional history obtained from chart review & nursing note review.   Patient provided a urine sample, post void bladder scan measured 485 cc, therefore Foley catheter insertion has been ordered. Suspect her retention may be post operative related as she has had this in the past, also possibly a UTI component with UA pending, she has no decreased sensation to the saddle region or LEs, 5/5 symmetric strength to LEs, and is ambulatory therefore does not seem consistent with cauda equina syndrome.   Plan for basic blood work, urinalysis, and analgesics.   Lab Tests:  I Ordered, reviewed, and interpreted labs, which included:  CBC: Unremarkable, no leukocytosis.  BMP: Mild hypercalcemia & hyperglycemia otherwise unremarkable.  UA: Large leukocytes, 11-20 WBCs, rare bacteria. Nitrite negative.   05:58: RE-EVAL: 400 cc yellow urine present in foley catheter, patient is feeling much better, pain improved. Remains without neuro deficits.   Urinalysis concerning for infection, most recent urine culture with e.coli- pan-sensitive. Will start on keflex with urine culture pending, this would also provide skin coverage with her erythema at surgical site, seems more irritated than gross cellulitis, however with some mild redness & being recent post op with her urinary retention will touch base with neurosurgery on call to determine if okay to discharge home with close follow up vs if they would prefer to evaluate patient in the ED.     06:20: CONSULT: Discussed with Burton on-call with neurosurgery practice, okay to discharge on Keflex 4 times daily for 1 week with outpatient follow-up which he relays is likely already scheduled, he will make Dr. Ellene Route aware of patient's ED visit/follow up. Appreciate consultation & input in care.   06:25: RE-EVAL: Patient continues to feel well, pain well controlled @ this time, feels comfortable w/ discharge. BP 154/78- improved. Will discharge with prescription for keflex at this time. I discussed results, treatment plan, need for follow-up, and return precautions with the patient & her husband. Provided opportunity for questions, patient & her husband confirmed understanding and are in agreement with plan.   Findings and plan of care discussed with supervising physician Dr. Christy Gentles who has evaluated patient as shared visit &  is in agreement.   Portions of this note were generated with [employer]. Dictation errors may occur despite best attempts at proofreading.  Final Clinical Impression(s) / ED Diagnoses Final diagnoses:  Urinary retention  Acute cystitis with hematuria  Post-operative pain    Rx / DC Orders ED Discharge Orders         Ordered  cephALEXin (KEFLEX) 500 MG capsule  4 times daily        01/02/21 0156           Amaryllis Dyke, PA-C 01/02/21 1537    Ripley Fraise, MD 01/02/21 [phone]

## 2021-01-02 NOTE — ED Triage Notes (Signed)
Pt here from home with c/o pain and urinary retention after back surgery  This past Thursday , pt was prescribed prescribed percocet but states that they are mot working

## 2021-01-03 NOTE — Discharge Summary (Signed)
Physician Discharge Summary  Patient ID: Erin Camacho MRN: [id number] DOB/AGE: [birth year] 75 y.o.  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  She tolerated surgery well.  She was discharged home within 24 hours.  Consults: None  Significant Diagnostic Studies: None  Treatments: surgery: Thoracic laminectomy and discectomy T7-T8.  Discharge Exam: Blood pressure (!) 99/53, pulse 66, temperature 97.8 F (36.6 C), temperature source Oral, resp. rate 16, height 5\' 5"  (1.651 m), weight 85.3 kg, SpO2 99 %. Incision is clean and dry Station and gait are intact.  Disposition: Discharge disposition: 01-Home or Self Care       Discharge Instructions    Call MD for:  redness, tenderness, or signs of infection (pain, swelling, redness, odor or green/yellow discharge around incision site)   Complete by: As directed    Call MD for:  severe uncontrolled pain   Complete by: As directed    Call MD for:  temperature >100.4   Complete by: As directed    Diet - low sodium heart healthy   Complete by: As directed    Diet general   Complete by: As directed    Discharge wound care:   Complete by: As directed    Okay to shower. Do not apply salves or appointments to incision. No heavy lifting with the upper extremities greater than 10 pounds. May resume driving when not requiring pain medication and patient feels comfortable with doing so.   Incentive spirometry RT   Complete by: As directed    Increase activity slowly   Complete by: As directed      Allergies as of 12/30/2020      Reactions   Adhesive  [tape] Itching   Honeycomb bandage - redness/itching      Medication List    TAKE these medications   acetaminophen 650 MG CR tablet Commonly known as: TYLENOL Take 1,950 mg by mouth in the morning and at bedtime.   ALPRAZolam 0.25 MG tablet Commonly known as: XANAX Take 0.25 mg by mouth daily as needed for anxiety.   amoxicillin 500 MG capsule Commonly known as: AMOXIL Take 2,000 mg by mouth See admin instructions. Take 4 capsules (2000 mg) by mouth 1 hour prior to dental appointments.   CALCIUM 600+D3 PO Take 1 tablet by mouth daily.   cholecalciferol 25 MCG (1000 UNIT) tablet Commonly known as: VITAMIN D Take 1,000 Units by mouth daily.   fexofenadine 180 MG tablet Commonly known as: ALLEGRA Take 1 tablet (180 mg total) by mouth daily.   fluticasone 50 MCG/ACT nasal spray Commonly known as: FLONASE SPRAY 2 SPRAYS INTO EACH NOSTRIL EVERY DAY   Iron 325 (65 Fe) MG Tabs Take 325 mg by mouth daily.   losartan-hydrochlorothiazide 50-12.5 MG tablet Commonly known as: HYZAAR TAKE 1 TABLET BY MOUTH EVERY DAY   Lubricant Eye Drops 0.4-0.3 % Soln Generic drug: Polyethyl Glycol-Propyl Glycol Place 1 drop into both eyes 3 (three) times daily as needed (dry/irritated eyes.).   magnesium oxide 400 MG tablet Commonly known as: MAG-OX TAKE 1 TABLET BY MOUTH EVERY DAY   metFORMIN 500 MG tablet Commonly known as: GLUCOPHAGE TAKE 1 TABLET BY MOUTH EVERY DAY   methocarbamol 500 MG tablet  Commonly known as: ROBAXIN Take 1 tablet (500 mg total) by mouth every 6 (six) hours as needed for muscle spasms.   metoprolol succinate 25 MG 24 hr tablet Commonly known as: TOPROL-XL TAKE 1/2 TABLET BY MOUTH EVERY DAY   multivitamin with minerals Tabs tablet Take 1 tablet by mouth daily.   OneTouch Verio test strip Generic drug: glucose blood USE TO CHECK BLOOD SUGAR TWICE A DAY   oxyCODONE-acetaminophen 5-325 MG tablet Commonly known as: PERCOCET/ROXICET Take 1-2 tablets by  mouth every 6 (six) hours as needed for severe pain.   pantoprazole 40 MG tablet Commonly known as: PROTONIX TAKE 1 TABLET (40 MG TOTAL) BY MOUTH 2 (TWO) TIMES DAILY BEFORE A MEAL.   Salonpas 3.11-24-08 % Ptch Generic drug: Camphor-Menthol-Methyl Sal Place 1 patch onto the skin daily as needed (pain.).   SALONPAS ARTHRITIS PAIN RELIEF EX Apply 1 application topically 3 (three) times daily as needed (pain.).   sertraline 100 MG tablet Commonly known as: ZOLOFT TAKE 1/2 TABLET BY MOUTH (50 MG) BY MOUTH DAILY   simvastatin 10 MG tablet Commonly known as: ZOCOR TAKE 1/2 TABLET (5 MG TOTAL) BY MOUTH AT BEDTIME.   tiZANidine 2 MG tablet Commonly known as: ZANAFLEX TAKE 1 TABLET (2 MG TOTAL) BY MOUTH 2 (TWO) TIMES DAILY AS NEEDED FOR MUSCLE SPASMS.   traMADol 50 MG tablet Commonly known as: ULTRAM Take 50 mg by mouth every 12 (twelve) hours as needed.   traZODone 100 MG tablet Commonly known as: DESYREL TAKE 1 TABLET BY MOUTH EVERYDAY AT BEDTIME            Discharge Care Instructions  (From admission, onward)         Start     Ordered   12/30/20 0000  Discharge wound care:       Comments: Okay to shower. Do not apply salves or appointments to incision. No heavy lifting with the upper extremities greater than 10 pounds. May resume driving when not requiring pain medication and patient feels comfortable with doing so.   12/30/20 1050           Signed: Blanchie Dessert Kinsey Karch 01/03/2021, 12:26 PM

## 2021-01-04 DIAGNOSIS — J301 Allergic rhinitis due to pollen: Secondary | ICD-10-CM | POA: Diagnosis not present

## 2021-01-04 LAB — URINE CULTURE: Culture: 50000 — AB

## 2021-01-05 NOTE — Progress Notes (Signed)
ED Antimicrobial Stewardship Positive Culture Follow Up   Erin Camacho is an 75 y.o. female who presented to [facility] on 01/02/2021 with a chief complaint of  Chief Complaint  Patient presents with  . Urinary Retention  . Back Pain    Recent Results (from the past 720 hour(s))  SARS CORONAVIRUS 2 (TAT 6-24 HRS) Nasopharyngeal Nasopharyngeal Swab     Status: None   Collection Time: 12/27/20  3:01 PM   Specimen: Nasopharyngeal Swab  Result Value Ref Range Status   SARS Coronavirus 2 NEGATIVE NEGATIVE Final    Comment: (NOTE) SARS-CoV-2 target nucleic acids are NOT DETECTED.  The SARS-CoV-2 RNA is generally detectable in upper and lower respiratory specimens during the acute phase of infection. Negative results do not preclude SARS-CoV-2 infection, do not rule out co-infections with other pathogens, and should not be used as the sole basis for treatment or other patient management decisions. Negative results must be combined with clinical observations, patient history, and epidemiological information. The expected result is Negative.  Fact Sheet for Patients: SugarRoll.be  Fact Sheet for Healthcare Providers: https://www.woods-mathews.com/  This test is not yet approved or cleared by the [place] FDA and  has been authorized for detection and/or diagnosis of SARS-CoV-2 by FDA under an Emergency Use Authorization (EUA). This EUA will remain  in effect (meaning this test can be used) for the duration of the COVID-19 declaration under Se ction 564(b)(1) of the Act, 21 U.S.C. section 360bbb-3(b)(1), unless the authorization is terminated or revoked sooner.  Performed at [facility], [place] [address], [place], [place] [address]   Urine culture     Status: Abnormal   Collection Time: 01/02/21  5:56 AM   Specimen: Urine, Random  Result Value Ref Range Status   Specimen Description URINE, RANDOM  Final   Special Requests    Final    NONE Performed at [facility], [place] [address] [place], [place] [address]    Culture 50,000 COLONIES/mL PSEUDOMONAS AERUGINOSA (A)  Final   Report Status 01/04/2021 FINAL  Final   Organism ID, Bacteria PSEUDOMONAS AERUGINOSA (A)  Final      Susceptibility   Pseudomonas aeruginosa - MIC*    CEFTAZIDIME 4 SENSITIVE Sensitive     CIPROFLOXACIN <=0.25 SENSITIVE Sensitive     GENTAMICIN <=1 SENSITIVE Sensitive     IMIPENEM 2 SENSITIVE Sensitive     PIP/TAZO 8 SENSITIVE Sensitive     CEFEPIME 2 SENSITIVE Sensitive     * 50,000 COLONIES/mL PSEUDOMONAS AERUGINOSA    Patient discharged with prescription for Keflex and culture report growing P. Aeruginosa. Spoke with ED provider Arlean Hopping PA-C and decided to reach out to patient. Patient denies any further urinary issues, denies pain, and states she has been taking antibiotics prescribed. Overall, patient indicated she felt much better than prior to her admission and no complaints at this time.   New antibiotic prescription: None  ED Provider: Arlean Hopping PA-C    [name] 01/05/2021, 10:06 AM Clinical Pharmacist Monday - Friday phone -  [phone] Saturday - Sunday phone - [phone]

## 2021-01-09 ENCOUNTER — Ambulatory Visit (INDEPENDENT_AMBULATORY_CARE_PROVIDER_SITE_OTHER): Payer: Medicare HMO | Admitting: Physician Assistant

## 2021-01-09 ENCOUNTER — Encounter: Payer: Self-pay | Admitting: Urology

## 2021-01-09 ENCOUNTER — Other Ambulatory Visit: Payer: Self-pay

## 2021-01-09 ENCOUNTER — Ambulatory Visit: Payer: Medicare HMO | Admitting: Urology

## 2021-01-09 VITALS — BP 128/85 | HR 69 | Ht 65.0 in | Wt 182.0 lb

## 2021-01-09 DIAGNOSIS — R339 Retention of urine, unspecified: Secondary | ICD-10-CM | POA: Diagnosis not present

## 2021-01-09 LAB — BLADDER SCAN AMB NON-IMAGING

## 2021-01-09 NOTE — Progress Notes (Signed)
Patient returned to clinic this afternoon for a bladder scan. She has been able to void; PVR 11mL. Voiding trial passed, patient to RTC as needed.  Results for orders placed or performed in visit on 01/09/21  BLADDER SCAN AMB NON-IMAGING  Result Value Ref Range   Scan Result 41mL

## 2021-01-09 NOTE — Progress Notes (Signed)
01/09/2021 8:41 AM   Erin Camacho 10/07/1946 [id number]  Referring provider: Einar Camacho, [address] [address] [place],  [place] [address]  Chief Complaint  Patient presents with  . Urinary Retention    HPI: Erin Camacho is a 75 y.o. female who presents for follow-up of urinary retention.     History herniated nucleus pulposus T7-T8 with myelopathy who underwent left hemilaminectomy and facetectomy T7-T8 with discectomy on 12/29/2020  Discharged 12/30/2020  Presented to [facility] ED 01/02/2021 with a 2-day history of difficulty with urination, frequency and sensation of incomplete emptying  Bladder scan PVR 485 cc and Foley catheter was placed  UA with 11-20 WBC and discharged on Keflex.  Urine culture grew 50,000 colonies Pseudomonas aeruginosa  Completed antibiotics yesterday  Prior to her surgery she had no voiding complaints or symptoms  States this was her fourth back surgery and she also had an episode of urinary retention after back surgery in 2014 which resolved after catheter drainage   PMH: Past Medical History:  Diagnosis Date  . Allergy    hay fever  . Anxiety   . Arthritis   . Chronic back pain   . Diabetes mellitus   . Dyspnea    a. 07/2017 Echo: EF 60-65%. No rwma. Mildly dil LA. Nl RV fxn.  Marland Kitchen GERD (gastroesophageal reflux disease)   . History of hiatal hernia   . Hyperlipidemia   . Hypertension   . Osteoarthritis   . Pneumonia   . Spinal stenosis   . Type 2 diabetes mellitus ([place])   . UTI (lower urinary tract infection)     Surgical History: Past Surgical History:  Procedure Laterality Date  . ABDOMINAL HYSTERECTOMY  1973  . ABDOMINAL HYSTERECTOMY    . APPENDECTOMY    . APPLICATION OF ROBOTIC ASSISTANCE FOR SPINAL PROCEDURE N/A 09/15/2018   Procedure: APPLICATION OF ROBOTIC ASSISTANCE FOR SPINAL PROCEDURE;  Surgeon: Erin Miss, MD;  Location: [facility];  Service: Neurosurgery;  Laterality: N/A;  . BACK SURGERY  04/17/13,  2017, 2019  . CATARACT EXTRACTION W/PHACO Left 12/16/2019   Procedure: CATARACT EXTRACTION PHACO AND INTRAOCULAR LENS PLACEMENT (IOC) LEFT PANOPTIX LENS DIABETIC 8.07 01:19.2 10.2%;  Surgeon: Erin Koyanagi, MD;  Location: [facility];  Service: Ophthalmology;  Laterality: Left;  Diabetic - oral meds  . CATARACT EXTRACTION W/PHACO Right 01/06/2020   Procedure: CATARACT EXTRACTION PHACO AND INTRAOCULAR LENS PLACEMENT (IOC) PANOPTIX LENS RIGHT DIABETIC 10.73 01:08.6 15.7%;  Surgeon: Erin Koyanagi, MD;  Location: [facility];  Service: Ophthalmology;  Laterality: Right;  diabetic  . CHOLECYSTECTOMY  1995  . EYE SURGERY  2021   bilateral cataract removal with lens implants  . HERNIA REPAIR  08/2006  . lap band  08/2003  . LUMBAR LAMINECTOMY/ DECOMPRESSION WITH MET-RX Left 12/29/2020   Procedure: Left Thoracic Seven-Eight Microdiscectomy;  Surgeon: Erin Miss, MD;  Location: [facility];  Service: Neurosurgery;  Laterality: Left;  . SEPTOPLASTY  04/1978  . TONSILLECTOMY AND ADENOIDECTOMY  1979  . TOTAL HIP ARTHROPLASTY  02/2012   Right, Dr. Marry Camacho  . TOTAL HIP ARTHROPLASTY Left 08/31/2019   Procedure: TOTAL HIP ARTHROPLASTY;  Surgeon: Erin Leep, MD;  Location: [facility];  Service: Orthopedics;  Laterality: Left;    Home Medications:  Allergies as of 01/09/2021      Reactions   Adhesive [tape] Itching   Honeycomb bandage - redness/itching      Medication List       Accurate as of January 09, 2021  8:41 AM. If you have any questions, ask your nurse or doctor.        acetaminophen 650 MG CR tablet Commonly known as: TYLENOL Take 1,950 mg by mouth in the morning and at bedtime.   ALPRAZolam 0.25 MG tablet Commonly known as: XANAX Take 0.25 mg by mouth daily as needed for anxiety.   amoxicillin 500 MG capsule Commonly known as: AMOXIL Take 2,000 mg by mouth See admin instructions. Take 4 capsules (2000 mg) by mouth 1 hour prior to dental appointments.    CALCIUM 600+D3 PO Take 1 tablet by mouth daily.   cephALEXin 500 MG capsule Commonly known as: KEFLEX Take 1 capsule (500 mg total) by mouth 4 (four) times daily.   cholecalciferol 25 MCG (1000 UNIT) tablet Commonly known as: VITAMIN D Take 1,000 Units by mouth daily.   fexofenadine 180 MG tablet Commonly known as: ALLEGRA Take 1 tablet (180 mg total) by mouth daily.   fluticasone 50 MCG/ACT nasal spray Commonly known as: FLONASE SPRAY 2 SPRAYS INTO EACH NOSTRIL EVERY DAY   Iron 325 (65 Fe) MG Tabs Take 325 mg by mouth daily.   losartan-hydrochlorothiazide 50-12.5 MG tablet Commonly known as: HYZAAR TAKE 1 TABLET BY MOUTH EVERY DAY   Lubricant Eye Drops 0.4-0.3 % Soln Generic drug: Polyethyl Glycol-Propyl Glycol Place 1 drop into both eyes 3 (three) times daily as needed (dry/irritated eyes.).   magnesium oxide 400 MG tablet Commonly known as: MAG-OX TAKE 1 TABLET BY MOUTH EVERY DAY   metFORMIN 500 MG tablet Commonly known as: GLUCOPHAGE TAKE 1 TABLET BY MOUTH EVERY DAY   methocarbamol 500 MG tablet Commonly known as: ROBAXIN Take 1 tablet (500 mg total) by mouth every 6 (six) hours as needed for muscle spasms.   metoprolol succinate 25 MG 24 hr tablet Commonly known as: TOPROL-XL TAKE 1/2 TABLET BY MOUTH EVERY DAY   multivitamin with minerals Tabs tablet Take 1 tablet by mouth daily.   OneTouch Verio test strip Generic drug: glucose blood USE TO CHECK BLOOD SUGAR TWICE A DAY   oxyCODONE-acetaminophen 5-325 MG tablet Commonly known as: PERCOCET/ROXICET Take 1-2 tablets by mouth every 6 (six) hours as needed for severe pain.   pantoprazole 40 MG tablet Commonly known as: PROTONIX TAKE 1 TABLET (40 MG TOTAL) BY MOUTH 2 (TWO) TIMES DAILY BEFORE A MEAL.   Salonpas 3.11-24-08 % Ptch Generic drug: Camphor-Menthol-Methyl Sal Place 1 patch onto the skin daily as needed (pain.).   SALONPAS ARTHRITIS PAIN RELIEF EX Apply 1 application topically 3 (three) times  daily as needed (pain.).   sertraline 100 MG tablet Commonly known as: ZOLOFT TAKE 1/2 TABLET BY MOUTH (50 MG) BY MOUTH DAILY   simvastatin 10 MG tablet Commonly known as: ZOCOR TAKE 1/2 TABLET (5 MG TOTAL) BY MOUTH AT BEDTIME.   tiZANidine 2 MG tablet Commonly known as: ZANAFLEX TAKE 1 TABLET (2 MG TOTAL) BY MOUTH 2 (TWO) TIMES DAILY AS NEEDED FOR MUSCLE SPASMS.   traMADol 50 MG tablet Commonly known as: ULTRAM Take 50 mg by mouth every 12 (twelve) hours as needed.   traZODone 100 MG tablet Commonly known as: DESYREL TAKE 1 TABLET BY MOUTH EVERYDAY AT BEDTIME       Allergies:  Allergies  Allergen Reactions  . Adhesive [Tape] Itching    Honeycomb bandage - redness/itching    Family History: Family History  Problem Relation Age of Onset  . Hypertension Mother   . Stroke Mother   . Heart disease  Mother   . Hypertension Father   . Stroke Father   . Heart disease Father   . Arthritis Sister   . Diabetes Sister   . Dementia Sister   . Cancer Sister   . Diabetes Brother   . Hypertension Brother   . Diabetes Brother   . Hypertension Brother   . Breast cancer Other 47  . Cancer Other        breast  . Diabetes Other   . Kidney disease Neg Hx   . Bladder Cancer Neg Hx     Social History:  reports that she has never smoked. She has never used smokeless tobacco. She reports previous alcohol use. She reports that she does not use drugs.   Physical Exam: BP 128/85   Pulse 69   Ht $R'5\' 5"'vD$  (1.651 m)   Wt 182 lb (82.6 kg)   BMI 30.29 kg/m   Constitutional:  Alert and oriented, No acute distress. HEENT: [place] AT, moist mucus membranes.  Trachea midline, no masses. Cardiovascular: No clubbing, cyanosis, or edema. Respiratory: Normal respiratory effort, no increased work of breathing. Psychiatric: Normal mood and affect.   Assessment & Plan:    1.  Urinary retention  Catheter removed for voiding trial  Has appointment this afternoon for bladder scan and if  emptying adequately she may return as needed   Abbie Sons, [facility] [address], [place], [place] [address] [phone]

## 2021-01-10 DIAGNOSIS — J301 Allergic rhinitis due to pollen: Secondary | ICD-10-CM | POA: Diagnosis not present

## 2021-01-12 ENCOUNTER — Other Ambulatory Visit (INDEPENDENT_AMBULATORY_CARE_PROVIDER_SITE_OTHER): Payer: Medicare HMO

## 2021-01-12 ENCOUNTER — Other Ambulatory Visit: Payer: Self-pay

## 2021-01-12 DIAGNOSIS — E7800 Pure hypercholesterolemia, unspecified: Secondary | ICD-10-CM | POA: Diagnosis not present

## 2021-01-12 DIAGNOSIS — E119 Type 2 diabetes mellitus without complications: Secondary | ICD-10-CM

## 2021-01-12 LAB — BASIC METABOLIC PANEL WITH GFR
BUN: 21 mg/dL (ref 6–23)
CO2: 28 meq/L (ref 19–32)
Calcium: 10.1 mg/dL (ref 8.4–10.5)
Chloride: 105 meq/L (ref 96–112)
Creatinine, Ser: 0.89 mg/dL (ref 0.40–1.20)
GFR: 63.3 mL/min
Glucose, Bld: 121 mg/dL — ABNORMAL HIGH (ref 70–99)
Potassium: 4 meq/L (ref 3.5–5.1)
Sodium: 142 meq/L (ref 135–145)

## 2021-01-12 LAB — LIPID PANEL
Cholesterol: 174 mg/dL (ref 0–200)
HDL: 62 mg/dL
LDL Cholesterol: 87 mg/dL (ref 0–99)
NonHDL: 111.64
Total CHOL/HDL Ratio: 3
Triglycerides: 123 mg/dL (ref 0.0–149.0)
VLDL: 24.6 mg/dL (ref 0.0–40.0)

## 2021-01-12 LAB — HEPATIC FUNCTION PANEL
ALT: 11 U/L (ref 0–35)
AST: 16 U/L (ref 0–37)
Albumin: 4.1 g/dL (ref 3.5–5.2)
Alkaline Phosphatase: 78 U/L (ref 39–117)
Bilirubin, Direct: 0.1 mg/dL (ref 0.0–0.3)
Total Bilirubin: 0.5 mg/dL (ref 0.2–1.2)
Total Protein: 6.6 g/dL (ref 6.0–8.3)

## 2021-01-12 LAB — HEMOGLOBIN A1C: Hgb A1c MFr Bld: 5.9 % (ref 4.6–6.5)

## 2021-01-16 ENCOUNTER — Other Ambulatory Visit: Payer: Self-pay

## 2021-01-16 ENCOUNTER — Encounter: Payer: Self-pay | Admitting: Internal Medicine

## 2021-01-16 ENCOUNTER — Ambulatory Visit (INDEPENDENT_AMBULATORY_CARE_PROVIDER_SITE_OTHER): Payer: Medicare HMO | Admitting: Internal Medicine

## 2021-01-16 VITALS — BP 118/62 | HR 67 | Temp 98.0°F | Resp 16 | Ht 65.0 in | Wt 190.0 lb

## 2021-01-16 DIAGNOSIS — K219 Gastro-esophageal reflux disease without esophagitis: Secondary | ICD-10-CM | POA: Diagnosis not present

## 2021-01-16 DIAGNOSIS — I10 Essential (primary) hypertension: Secondary | ICD-10-CM

## 2021-01-16 DIAGNOSIS — Z0000 Encounter for general adult medical examination without abnormal findings: Secondary | ICD-10-CM | POA: Diagnosis not present

## 2021-01-16 DIAGNOSIS — E119 Type 2 diabetes mellitus without complications: Secondary | ICD-10-CM

## 2021-01-16 DIAGNOSIS — F320 Major depressive disorder, single episode, mild: Secondary | ICD-10-CM | POA: Diagnosis not present

## 2021-01-16 DIAGNOSIS — E7800 Pure hypercholesterolemia, unspecified: Secondary | ICD-10-CM | POA: Diagnosis not present

## 2021-01-16 DIAGNOSIS — I700 Atherosclerosis of aorta: Secondary | ICD-10-CM

## 2021-01-16 DIAGNOSIS — R69 Illness, unspecified: Secondary | ICD-10-CM | POA: Diagnosis not present

## 2021-01-16 DIAGNOSIS — F32A Depression, unspecified: Secondary | ICD-10-CM

## 2021-01-16 NOTE — Progress Notes (Signed)
Patient ID: Erin Camacho, female   DOB: 04/03/1946, 75 y.o.   MRN: [id number]   Subjective:    Patient ID: Erin Camacho, female    DOB: [birth year], 75 y.o.   MRN: [id number]  HPI This visit occurred during the SARS-CoV-2 public health emergency.  Safety protocols were in place, including screening questions prior to the visit, additional usage of staff PPE, and extensive cleaning of exam room while observing appropriate contact time as indicated for disinfecting solutions.  Patient here for her physical exam.  She is s/p back surgery.  Doing well.  Pain has improved.  Just saw Dr Ellene Route last week.  Feels better.  No chest pain or sob reported.  No abdominal pain or bowel change reported.  Breathing stable.  Sugars doing well.  No low sugars.  Had rash at surgical site.  Saw Dr Phillip Heal.  Used cream.  Is better.  Discussed labs.  Had problems with urinary retention after surgery.  Saw urology.  Catheter placed.  Removed and doing well now.  No urinary issues now.   Past Medical History:  Diagnosis Date  . Allergy    hay fever  . Anxiety   . Arthritis   . Chronic back pain   . Diabetes mellitus   . Dyspnea    a. 07/2017 Echo: EF 60-65%. No rwma. Mildly dil LA. Nl RV fxn.  Marland Kitchen GERD (gastroesophageal reflux disease)   . History of hiatal hernia   . Hyperlipidemia   . Hypertension   . Osteoarthritis   . Pneumonia   . Spinal stenosis   . Type 2 diabetes mellitus ([place])   . UTI (lower urinary tract infection)    Past Surgical History:  Procedure Laterality Date  . ABDOMINAL HYSTERECTOMY  1973  . ABDOMINAL HYSTERECTOMY    . APPENDECTOMY    . APPLICATION OF ROBOTIC ASSISTANCE FOR SPINAL PROCEDURE N/A 09/15/2018   Procedure: APPLICATION OF ROBOTIC ASSISTANCE FOR SPINAL PROCEDURE;  Surgeon: Kristeen Miss, MD;  Location: [facility];  Service: Neurosurgery;  Laterality: N/A;  . BACK SURGERY  04/17/13, 2017, 2019  . CATARACT EXTRACTION W/PHACO Left 12/16/2019   Procedure: CATARACT EXTRACTION PHACO AND  INTRAOCULAR LENS PLACEMENT (IOC) LEFT PANOPTIX LENS DIABETIC 8.07 01:19.2 10.2%;  Surgeon: Leandrew Koyanagi, MD;  Location: [facility];  Service: Ophthalmology;  Laterality: Left;  Diabetic - oral meds  . CATARACT EXTRACTION W/PHACO Right 01/06/2020   Procedure: CATARACT EXTRACTION PHACO AND INTRAOCULAR LENS PLACEMENT (IOC) PANOPTIX LENS RIGHT DIABETIC 10.73 01:08.6 15.7%;  Surgeon: Leandrew Koyanagi, MD;  Location: [facility];  Service: Ophthalmology;  Laterality: Right;  diabetic  . CHOLECYSTECTOMY  1995  . EYE SURGERY  2021   bilateral cataract removal with lens implants  . HERNIA REPAIR  08/2006  . lap band  08/2003  . LUMBAR LAMINECTOMY/ DECOMPRESSION WITH MET-RX Left 12/29/2020   Procedure: Left Thoracic Seven-Eight Microdiscectomy;  Surgeon: Kristeen Miss, MD;  Location: [facility];  Service: Neurosurgery;  Laterality: Left;  . SEPTOPLASTY  04/1978  . TONSILLECTOMY AND ADENOIDECTOMY  1979  . TOTAL HIP ARTHROPLASTY  02/2012   Right, Dr. Marry Guan  . TOTAL HIP ARTHROPLASTY Left 08/31/2019   Procedure: TOTAL HIP ARTHROPLASTY;  Surgeon: Dereck Leep, MD;  Location: [facility];  Service: Orthopedics;  Laterality: Left;   Family History  Problem Relation Age of Onset  . Hypertension Mother   . Stroke Mother   . Heart disease Mother   . Hypertension Father   . Stroke Father   .  Heart disease Father   . Arthritis Sister   . Diabetes Sister   . Dementia Sister   . Cancer Sister   . Diabetes Brother   . Hypertension Brother   . Diabetes Brother   . Hypertension Brother   . Breast cancer Other 47  . Cancer Other        breast  . Diabetes Other   . Kidney disease Neg Hx   . Bladder Cancer Neg Hx    Social History   Socioeconomic History  . Marital status: Married    Spouse name: Not on file  . Number of children: Not on file  . Years of education: Not on file  . Highest education level: Not on file  Occupational History  . Not on file  Tobacco Use  .  Smoking status: Never Smoker  . Smokeless tobacco: Never Used  Vaping Use  . Vaping Use: Never used  Substance and Sexual Activity  . Alcohol use: Not Currently    Alcohol/week: 0.0 - 1.0 standard drinks    Comment: occ  . Drug use: No  . Sexual activity: Not Currently  Other Topics Concern  . Not on file  Social History Narrative   Lives in [place] with husband.      Work - retired Sports administrator   Social Determinants of Radio broadcast assistant Strain: Not on [employer] Insecurity: Not on file  Transportation Needs: Not on file  Physical Activity: Not on file  Stress: Not on file  Social Connections: Not on file    Outpatient Encounter Medications as of 01/16/2021  Medication Sig  . acetaminophen (TYLENOL) 650 MG CR tablet Take 1,950 mg by mouth in the morning and at bedtime.  . ALPRAZolam (XANAX) 0.25 MG tablet Take 0.25 mg by mouth daily as needed for anxiety.  Marland Kitchen amoxicillin (AMOXIL) 500 MG capsule Take 2,000 mg by mouth See admin instructions. Take 4 capsules (2000 mg) by mouth 1 hour prior to dental appointments.  . Calcium Carb-Cholecalciferol (CALCIUM 600+D3 PO) Take 1 tablet by mouth daily.  . Camphor-Menthol-Methyl Sal (SALONPAS) 3.11-24-08 % PTCH Place 1 patch onto the skin daily as needed (pain.).  Marland Kitchen cephALEXin (KEFLEX) 500 MG capsule Take 1 capsule (500 mg total) by mouth 4 (four) times daily.  . cholecalciferol (VITAMIN D) 25 MCG (1000 UNIT) tablet Take 1,000 Units by mouth daily.  . Ferrous Sulfate (IRON) 325 (65 Fe) MG TABS Take 325 mg by mouth daily.  . fexofenadine (ALLEGRA) 180 MG tablet Take 1 tablet (180 mg total) by mouth daily.  . fluticasone (FLONASE) 50 MCG/ACT nasal spray SPRAY 2 SPRAYS INTO EACH NOSTRIL EVERY DAY  . Liniments (SALONPAS ARTHRITIS PAIN RELIEF EX) Apply 1 application topically 3 (three) times daily as needed (pain.).  Marland Kitchen losartan-hydrochlorothiazide (HYZAAR) 50-12.5 MG tablet TAKE 1 TABLET BY MOUTH EVERY DAY  . magnesium oxide  (MAG-OX) 400 MG tablet TAKE 1 TABLET BY MOUTH EVERY DAY  . metFORMIN (GLUCOPHAGE) 500 MG tablet TAKE 1 TABLET BY MOUTH EVERY DAY  . methocarbamol (ROBAXIN) 500 MG tablet Take 1 tablet (500 mg total) by mouth every 6 (six) hours as needed for muscle spasms.  . metoprolol succinate (TOPROL-XL) 25 MG 24 hr tablet TAKE 1/2 TABLET BY MOUTH EVERY DAY  . Multiple Vitamin (MULTIVITAMIN WITH MINERALS) TABS tablet Take 1 tablet by mouth daily.  Glory Rosebush VERIO test strip USE TO CHECK BLOOD SUGAR TWICE A DAY  . oxyCODONE-acetaminophen (PERCOCET/ROXICET) 5-325 MG tablet Take  1-2 tablets by mouth every 6 (six) hours as needed for severe pain.  . pantoprazole (PROTONIX) 40 MG tablet TAKE 1 TABLET (40 MG TOTAL) BY MOUTH 2 (TWO) TIMES DAILY BEFORE A MEAL.  Vladimir Faster Glycol-Propyl Glycol (LUBRICANT EYE DROPS) 0.4-0.3 % SOLN Place 1 drop into both eyes 3 (three) times daily as needed (dry/irritated eyes.).  Marland Kitchen sertraline (ZOLOFT) 100 MG tablet TAKE 1/2 TABLET BY MOUTH (50 MG) BY MOUTH DAILY  . simvastatin (ZOCOR) 10 MG tablet TAKE 1/2 TABLET (5 MG TOTAL) BY MOUTH AT BEDTIME.  Marland Kitchen tiZANidine (ZANAFLEX) 2 MG tablet TAKE 1 TABLET (2 MG TOTAL) BY MOUTH 2 (TWO) TIMES DAILY AS NEEDED FOR MUSCLE SPASMS.  Marland Kitchen traMADol (ULTRAM) 50 MG tablet Take 50 mg by mouth every 12 (twelve) hours as needed.  . traZODone (DESYREL) 100 MG tablet TAKE 1 TABLET BY MOUTH EVERYDAY AT BEDTIME   No facility-administered encounter medications on file as of 01/16/2021.    Review of Systems  Constitutional: Negative for appetite change and unexpected weight change.  HENT: Negative for congestion, sinus pressure and sore throat.   Eyes: Negative for pain and visual disturbance.  Respiratory: Negative for cough, chest tightness and shortness of breath.   Cardiovascular: Negative for chest pain, palpitations and leg swelling.  Gastrointestinal: Negative for abdominal pain, diarrhea, nausea and vomiting.  Genitourinary: Negative for difficulty  urinating and dysuria.  Musculoskeletal: Positive for back pain. Negative for joint swelling and myalgias.  Skin: Negative for color change and rash.  Neurological: Negative for dizziness, light-headedness and headaches.  Hematological: Negative for adenopathy. Does not bruise/bleed easily.  Psychiatric/Behavioral: Negative for agitation and dysphoric mood.       Objective:    Physical Exam Vitals reviewed.  Constitutional:      General: She is not in acute distress.    Appearance: Normal appearance. She is well-developed and well-nourished.  HENT:     Head: Normocephalic and atraumatic.     Right Ear: External ear normal.     Left Ear: External ear normal.     Mouth/Throat:     Mouth: Oropharynx is clear and moist.  Eyes:     General: No scleral icterus.       Right eye: No discharge.        Left eye: No discharge.     Conjunctiva/sclera: Conjunctivae normal.  Neck:     Thyroid: No thyromegaly.  Cardiovascular:     Rate and Rhythm: Normal rate and regular rhythm.  Pulmonary:     Effort: No tachypnea, accessory muscle usage or respiratory distress.     Breath sounds: Normal breath sounds. No decreased breath sounds or wheezing.  Chest:  Breasts:     Right: No inverted nipple, mass, nipple discharge or tenderness (no axillary adenopathy).     Left: No inverted nipple, mass, nipple discharge or tenderness (no axilarry adenopathy).    Abdominal:     General: Bowel sounds are normal.     Palpations: Abdomen is soft.     Tenderness: There is no abdominal tenderness.  Musculoskeletal:        General: No swelling, tenderness or edema.     Cervical back: Neck supple. No tenderness.  Lymphadenopathy:     Cervical: No cervical adenopathy.  Skin:    Findings: No erythema or rash.     Comments: Well healed incision site.   Neurological:     Mental Status: She is alert and oriented to person, place, and time.  Psychiatric:  Mood and Affect: Mood and affect and mood  normal.        Behavior: Behavior normal.     BP 118/62   Pulse 67   Temp 98 F (36.7 C) (Oral)   Resp 16   Ht $R'5\' 5"'EW$  (1.651 m)   Wt 190 lb (86.2 kg)   SpO2 98%   BMI 31.62 kg/m  Wt Readings from Last 3 Encounters:  01/16/21 190 lb (86.2 kg)  01/09/21 182 lb (82.6 kg)  12/29/20 188 lb (85.3 kg)     Lab Results  Component Value Date   WBC 7.4 01/02/2021   HGB 13.4 01/02/2021   HCT 42.9 01/02/2021   PLT 182 01/02/2021   GLUCOSE 121 (H) 01/12/2021   CHOL 174 01/12/2021   TRIG 123.0 01/12/2021   HDL 62.00 01/12/2021   LDLDIRECT 146.5 09/19/2012   LDLCALC 87 01/12/2021   ALT 11 01/12/2021   AST 16 01/12/2021   NA 142 01/12/2021   K 4.0 01/12/2021   CL 105 01/12/2021   CREATININE 0.89 01/12/2021   BUN 21 01/12/2021   CO2 28 01/12/2021   TSH 2.24 04/19/2020   INR 1.0 08/21/2019   HGBA1C 5.9 01/12/2021   MICROALBUR 0.8 04/19/2020       Assessment & Plan:   Problem List Items Addressed This Visit    Aortic atherosclerosis ([place])    Continue simvastatin.       Diabetes mellitus type 2, diet-controlled ([facility])    Low carb diet and exercise.  Follow met b and a1c.   Lab Results  Component Value Date   HGBA1C 5.9 01/12/2021        Relevant Orders   Hemoglobin I7O   Basic metabolic panel   Microalbumin / creatinine urine ratio   GERD (gastroesophageal reflux disease)    Has seen GI.  Last note reviewed.  On protonix.  No upper symptoms reported.  Continue protonix.  Follow.       Healthcare maintenance    Physical today 22/28/22.  Mammogram 11/10/20 - Birads I.  cologuard negative 07/2017.  Due f/u colon cancer screening.  Discuss with her regarding f/u with GI.        Hypercholesterolemia    Continue simvastatin.  Low cholesterol diet and exercise.  Follow lipid panel and liver function tests.        Relevant Orders   Lipid panel   Hepatic function panel   Hypertension    Blood pressure doing well.  Continue metoprolol and losartan/hctz.  Follow  pressures.  Follow metabolic panel.       Relevant Orders   TSH   Mild depression ([place])    On zoloft.  Doing better.  Follow.            [name], MD

## 2021-01-17 ENCOUNTER — Encounter: Payer: Self-pay | Admitting: Internal Medicine

## 2021-01-17 DIAGNOSIS — J301 Allergic rhinitis due to pollen: Secondary | ICD-10-CM | POA: Diagnosis not present

## 2021-01-22 ENCOUNTER — Encounter: Payer: Self-pay | Admitting: Internal Medicine

## 2021-01-22 ENCOUNTER — Telehealth: Payer: Self-pay | Admitting: Internal Medicine

## 2021-01-22 NOTE — Assessment & Plan Note (Signed)
Continue simvastatin. 

## 2021-01-22 NOTE — Assessment & Plan Note (Signed)
Physical today 22/28/22.  Mammogram 11/10/20 - Birads I.  cologuard negative 07/2017.  Due f/u colon cancer screening.  Discuss with her regarding f/u with GI.

## 2021-01-22 NOTE — Assessment & Plan Note (Signed)
Blood pressure doing well.  Continue metoprolol and losartan/hctz.  Follow pressures.  Follow metabolic panel.

## 2021-01-22 NOTE — Assessment & Plan Note (Signed)
Continue simvastatin.  Low cholesterol diet and exercise.  Follow lipid panel and liver function tests.   

## 2021-01-22 NOTE — Telephone Encounter (Signed)
In reviewing, she had cologuard 2018.  Is due f/u colon cancer screening.  She had seen GI.  Is she agreeable to referral to GI for evaluation for screening colonoscopy.  I can place order for referral.  Also, need to document if had covid booster.  (need date or postpone).  Thanks.

## 2021-01-22 NOTE — Assessment & Plan Note (Signed)
Low carb diet and exercise.  Follow met b and a1c.   Lab Results  Component Value Date   HGBA1C 5.9 01/12/2021

## 2021-01-22 NOTE — Assessment & Plan Note (Signed)
On zoloft.  Doing better.  Follow.

## 2021-01-22 NOTE — Assessment & Plan Note (Signed)
Has seen GI.  Last note reviewed.  On protonix.  No upper symptoms reported.  Continue protonix.  Follow.

## 2021-01-24 DIAGNOSIS — J301 Allergic rhinitis due to pollen: Secondary | ICD-10-CM | POA: Diagnosis not present

## 2021-01-24 NOTE — Telephone Encounter (Signed)
She is agreeable to colonoscopy- Dr Alice Reichert. For the last 4 or 5 days she has been having muscle spasms in her back. She ended up having to take a tramadol and it finally clears up. She was wondering if there is another doctor that she can see for the spasms in her back. Dr Ellene Route told her  he was not sure what was causing the spasms.

## 2021-01-24 NOTE — Telephone Encounter (Signed)
Pt is going to contact Dr Ellene Route

## 2021-01-24 NOTE — Telephone Encounter (Signed)
I am not sure that I follow fully.  Dr Ellene Route just did her back surgery.  If she is having persistent spasms, I would recommend she contact him and let him know what pain/spasms she is continuing to have.  He can they determine next best step for evaluation and treatment.

## 2021-01-26 ENCOUNTER — Encounter: Payer: Self-pay | Admitting: Internal Medicine

## 2021-01-26 DIAGNOSIS — K649 Unspecified hemorrhoids: Secondary | ICD-10-CM

## 2021-01-26 DIAGNOSIS — Z1211 Encounter for screening for malignant neoplasm of colon: Secondary | ICD-10-CM

## 2021-01-27 NOTE — Telephone Encounter (Signed)
Order placed for referral to Dr Byrnett.   

## 2021-01-31 DIAGNOSIS — J301 Allergic rhinitis due to pollen: Secondary | ICD-10-CM | POA: Diagnosis not present

## 2021-02-07 DIAGNOSIS — J301 Allergic rhinitis due to pollen: Secondary | ICD-10-CM | POA: Diagnosis not present

## 2021-02-14 DIAGNOSIS — J301 Allergic rhinitis due to pollen: Secondary | ICD-10-CM | POA: Diagnosis not present

## 2021-02-21 DIAGNOSIS — J301 Allergic rhinitis due to pollen: Secondary | ICD-10-CM | POA: Diagnosis not present

## 2021-02-23 DIAGNOSIS — B9689 Other specified bacterial agents as the cause of diseases classified elsewhere: Secondary | ICD-10-CM | POA: Diagnosis not present

## 2021-02-23 DIAGNOSIS — R059 Cough, unspecified: Secondary | ICD-10-CM | POA: Diagnosis not present

## 2021-02-23 DIAGNOSIS — J019 Acute sinusitis, unspecified: Secondary | ICD-10-CM | POA: Diagnosis not present

## 2021-02-24 ENCOUNTER — Other Ambulatory Visit: Payer: Self-pay | Admitting: Internal Medicine

## 2021-02-27 ENCOUNTER — Other Ambulatory Visit: Payer: Self-pay | Admitting: Internal Medicine

## 2021-02-28 ENCOUNTER — Other Ambulatory Visit: Payer: Self-pay | Admitting: Neurological Surgery

## 2021-02-28 DIAGNOSIS — J301 Allergic rhinitis due to pollen: Secondary | ICD-10-CM | POA: Diagnosis not present

## 2021-02-28 DIAGNOSIS — M5104 Intervertebral disc disorders with myelopathy, thoracic region: Secondary | ICD-10-CM

## 2021-03-02 DIAGNOSIS — K648 Other hemorrhoids: Secondary | ICD-10-CM | POA: Diagnosis not present

## 2021-03-02 DIAGNOSIS — K625 Hemorrhage of anus and rectum: Secondary | ICD-10-CM | POA: Diagnosis not present

## 2021-03-04 ENCOUNTER — Other Ambulatory Visit: Payer: Self-pay | Admitting: General Surgery

## 2021-03-04 ENCOUNTER — Other Ambulatory Visit: Payer: Self-pay | Admitting: Internal Medicine

## 2021-03-04 NOTE — Progress Notes (Signed)
Subjective:     Patient ID: Erin Camacho is a 75 y.o. female.  HPI  The following portions of the patient's history were reviewed and updated as appropriate.  This a new patient is here today for: office visit. She is here to discuss having a colonoscopy. Prior colonoscopy was Dr Vira Agar, 2008. She had a negative Cologuard 4 years ago, 2018 She also admits to hemorrhoids. She states she has an external hemorrhoid for 50 years that bleeds with a BM and occasionally itches.  Review of Systems  Constitutional: Negative for chills and fever.  Respiratory: Negative for cough.        Chief Complaint  Patient presents with  . Follow-up     BP (!) 152/86   Pulse 74   Temp 36.6 C (97.9 F)   Ht 165.1 cm (5' 5")   Wt 86.6 kg (191 lb)   SpO2 96%   BMI 31.78 kg/m       Past Medical History:  Diagnosis Date  . Allergy   . Bronchitis    recurrent  . Chronic back pain   . Colon polyps    hyperplastic  . DDD (degenerative disc disease), lumbar    lumbar spine with spinal stenosis  . Depression    Anxiety  . Diabetes mellitus type 2, uncomplicated (CMS-HCC)    Borderline  . Environmental allergies    dust mites  . GERD (gastroesophageal reflux disease)   . Hyperlipidemia   . Hypertension   . Osteoarthritis   . Restless leg syndrome   . Sleep apnea    questionable  . Spinal stenosis           Past Surgical History:  Procedure Laterality Date  . APPENDECTOMY    . BACK SURGERY  04/18/2013   lumbar, 4 level spinal fusion, [place]  . Back surgery   11/2015   Dr Ellene Route, [place]   . CATARACT EXTRACTION Right 01/06/2020  . CATARACT EXTRACTION Left 12/16/2019  . CHOLECYSTECTOMY  1995   laparoscopic - Dr. Pat Patrick  . COLONOSCOPY  2008   Dr Vira Agar  . Gastric lap band adjustment  08/2008  . HERNIA REPAIR  08/21/2006   hiatal hernia  . HYSTERECTOMY  1973  . INCISION TENDON SHEATH FOR TRIGGER FINGER Right 07/20/2020   Dr.  Rudene Christians ([facility]  . Laparoscopic gastric band procedure  08/20/2003  . Left total hip arthroplasty  08/31/2019   Dr Marry Guan  . POSTERIOR LAMINECTOMY / DECOMPRESSION LUMBAR SPINE Left 12/29/2020   [facility]  . REPLACEMENT TOTAL HIP W/  RESURFACING IMPLANTS    . Right total hip arthroplasty  03/12/2012  . SEPTOPLASTY  1979  . TONSILLECTOMY  1979   Adenoidectomy              OB History    Gravida  1   Para  1   Term  1   Preterm      AB      Living  1     SAB      IAB      Ectopic      Molar      Multiple      Live Births          Obstetric Comments  Age at first period 54 Age of first pregnancy 65         Social History          Socioeconomic History  . Marital status: Married    Spouse  name: Arnett  . Number of children: 1  . Years of education: 13+  Occupational History  . Occupation: RetiredPsychiatric nurse work  Tobacco Use  . Smoking status: Never Smoker  . Smokeless tobacco: Never Used  Vaping Use  . Vaping Use: Never used  Substance and Sexual Activity  . Alcohol use: Yes    Comment: Drinks very rarely  . Drug use: No  . Sexual activity: Defer    Partners: Male            Allergies  Allergen Reactions  . Adhesive Itching  . Tapentadol Itching    Honeycomb bandage     Current Medications        Current Outpatient Medications  Medication Sig Dispense Refill  . ALPRAZolam (XANAX) 0.25 MG tablet Take 0.25 mg by mouth once daily as needed      . baclofen (LIORESAL) 10 MG tablet Take 10 mg by mouth 3 (three) times daily    . cholecalciferol (VITAMIN D3) 1000 unit capsule Take 1,000 Units by mouth once daily      . ferrous sulfate 325 (65 FE) MG tablet Take 325 mg by mouth daily with breakfast    . fexofenadine (ALLEGRA) 180 MG tablet Take 180 mg by mouth once daily as needed      . fluticasone (FLONASE) 50 mcg/actuation nasal spray Place 2 sprays into both nostrils 2 (two) times daily as needed       . lidocaine HCL 4 % lqro Apply 1 Application topically 2 (two) times daily as needed       . losartan-hydrochlorothiazide (HYZAAR) 50-12.5 mg tablet Take 1 tablet by mouth once daily      . magnesium oxide 500 mg Cap TAKE 1 CAPSULE (500 MG TOTAL) BY MOUTH 2 (TWO) TIMES DAILY AT 8 AM AND 10 PM.    . metFORMIN (GLUCOPHAGE) 500 MG tablet Take 500 mg by mouth nightly       . multivitamin tablet Take 1 tablet by mouth once daily      . ONETOUCH DELICA PLUS LANCET 33 gauge Misc USE WITH METER TO TEST BLOOD GLUCOSE TWICE A DAY    . ONETOUCH VERIO test strip USE TO CHECK BLOOD SUGAR TWICE A DAY    . pantoprazole (PROTONIX) 40 MG DR tablet Take 40 mg by mouth Daily before dinner    . polyethylene glycol (MIRALAX) powder Take 17 g by mouth once daily Mix in 4-8ounces of fluid prior to taking.    . sertraline (ZOLOFT) 100 MG tablet TAKE 1/2 TABLET BY MOUTH (50 MG) BY MOUTH DAILY    . simvastatin (ZOCOR) 10 MG tablet Take 10 mg by mouth nightly TAKE 1/2 TABLET QD    . tiZANidine (ZANAFLEX) 2 MG tablet PLEASE SEE ATTACHED FOR DETAILED DIRECTIONS    . traMADol (ULTRAM) 50 mg tablet 1/2-1 po tid prn 90 tablet 1  . traZODone (DESYREL) 100 MG tablet Take 100 mg by mouth nightly as needed       . albuterol (PROVENTIL) 2.5 mg /3 mL (0.083 %) nebulizer solution Take 3 mLs (2.5 mg total) by nebulization every 6 (six) hours as needed for wheezing. Please disp with nebulizer machine    . benzonatate (TESSALON) 200 MG capsule Take 1 capsule (200 mg total) by mouth 3 (three) times daily as needed for Cough (Patient not taking: Reported on 03/02/2021) 30 capsule 1  . cefdinir (OMNICEF) 300 mg capsule Take 1 capsule (300 mg total) by mouth 2 (two) times daily  for 7 days (Patient not taking: Reported on 03/02/2021) 14 capsule 0  . hydrocodone-chlorpheniramine (TUSSIONEX) 10-8 mg/5 mL ER suspension Take 5 mLs by mouth every 12 (twelve) hours as needed for Cough (Patient not taking: Reported on  03/02/2021) 120 mL 0  . metoprolol succinate (TOPROL-XL) 25 MG XL tablet Take 25 mg by mouth once daily   (Patient not taking: No sig reported)     No current facility-administered medications for this visit.           Family History  Problem Relation Age of Onset  . High blood pressure (Hypertension) Mother   . Stroke Mother   . Heart disease Mother   . Heart disease Father   . Stroke Father   . High blood pressure (Hypertension) Father   . Brain hemorrhage Father   . Diabetes Sister   . Stomach cancer Sister   . Arthritis Sister   . Dementia Sister   . Multiple sclerosis Brother   . Heart block Brother   . High blood pressure (Hypertension) Brother   . Diabetes Brother   . Bladder Cancer Neg Hx   . Kidney disease Neg Hx          Objective:   Physical Exam Exam conducted with a chaperone present.  Constitutional:      Appearance: Normal appearance.  Cardiovascular:     Rate and Rhythm: Normal rate and regular rhythm.     Pulses: Normal pulses.     Heart sounds: Normal heart sounds.  Pulmonary:     Effort: Pulmonary effort is normal.     Breath sounds: Normal breath sounds.  Abdominal:     General: Abdomen is flat. Bowel sounds are normal.     Palpations: Abdomen is soft.  Genitourinary:   Musculoskeletal:     Cervical back: Neck supple.  Lymphadenopathy:     Lower Body: No right inguinal adenopathy. No left inguinal adenopathy.  Skin:    General: Skin is warm and dry.  Neurological:     Mental Status: She is alert and oriented to person, place, and time.  Psychiatric:        Mood and Affect: Mood normal.        Behavior: Behavior normal.     Labs and Radiology:   February 24, 2007 colonoscopy: Completed by Rhona Leavens, MD: Few diverticuli of the sigmoid colon.  Laboratory review January 02, 2021:  WBC 4.0 - 10.5 K/uL 7.4   RBC 3.87 - 5.11 MIL/uL 4.29   Hemoglobin 12.0 - 15.0 g/dL 13.4   HCT 36.0 - 46.0 % 42.9   MCV  80.0 - 100.0 fL 100.0   MCH 26.0 - 34.0 pg 31.2   MCHC 30.0 - 36.0 g/dL 31.2   RDW 11.5 - 15.5 % 12.4   Platelets 150 - 400 K/uL 182   nRBC 0.0 - 0.2 % 0.0   Neutrophils Relative % % 67   Neutro Abs 1.7 - 7.7 K/uL 5.0   Lymphocytes Relative % 18   Lymphs Abs 0.7 - 4.0 K/uL 1.4   Monocytes Relative % 8   Monocytes Absolute 0.1 - 1.0 K/uL 0.6   Eosinophils Relative % 6   Eosinophils Absolute 0.0 - 0.5 K/uL 0.4   Basophils Relative % 1   Basophils Absolute 0.0 - 0.1 K/uL 0.0   Immature Granulocytes % 0   Abs Immature Granulocytes 0.00 - 0.07 K/uL 0.02    Sodium 135 - 145 mmol/L 140   Potassium 3.5 -  5.1 mmol/L 3.7   Chloride 98 - 111 mmol/L 99   CO2 22 - 32 mmol/L 31   Glucose, Bld 70 - 99 mg/dL 142High   Comment: Glucose reference range applies only to samples taken after fasting for at least 8 hours.  BUN 8 - 23 mg/dL 14   Creatinine, Ser 0.44 - 1.00 mg/dL 0.87   Calcium 8.9 - 10.3 mg/dL 10.5High   GFR, Estimated >60 mL/min >60   Comment: (NOTE)  Calculated using the CKD-EPI Creatinine Equation (2021)   Anion gap 5 - 15 10    November 28, 2020 ECG:  Sinus bradycardia, left axis deviation, left ventricular hypertrophy.  No change from prior tracing.     Assessment:     Candidate for colon screening.  Internal hemorrhoids with bleeding.  Pruritus ani    Plan:     The patient was instructed on appropriate perianal care.     Recommend avoiding soap and wash cloth to the rectal area, gently clean and pat dry.  Use of a hairdryer and cotton underwear encouraged.  Options for colon screening reviewed: 1) repeat Cologuard versus 2) colonoscopy.  The patient desires to proceed with colonoscopy.  Risks and benefits reviewed.  She will benefit from hemorrhoid banding, and unfortunately banding equipment cannot be moved from the office to the hospital setting.  We will arrange for an office visit to complete this after her upcoming colonoscopy. Patient to  follow up as scheduled and is aware to call for any new issues or concerns.    Entered by Karie Fetch, RN, acting as a scribe for Dr. Hervey Ard, MD.  The documentation recorded by the scribe accurately reflects the service I personally performed and the decisions made by me.   Robert Bellow, MD FACS

## 2021-03-07 ENCOUNTER — Encounter: Payer: Self-pay | Admitting: Internal Medicine

## 2021-03-08 ENCOUNTER — Ambulatory Visit (INDEPENDENT_AMBULATORY_CARE_PROVIDER_SITE_OTHER): Payer: Medicare HMO

## 2021-03-08 ENCOUNTER — Other Ambulatory Visit: Payer: Self-pay

## 2021-03-08 VITALS — BP 134/81 | HR 72 | Temp 97.3°F | Resp 16 | Ht 65.0 in | Wt 196.4 lb

## 2021-03-08 DIAGNOSIS — Z0000 Encounter for general adult medical examination without abnormal findings: Secondary | ICD-10-CM

## 2021-03-08 NOTE — Patient Instructions (Addendum)
Erin Camacho , Thank you for taking time to come for your Medicare Wellness Visit. I appreciate your ongoing commitment to your health goals. Please review the following plan we discussed and let me know if I can assist you in the future.   These are the goals we discussed: Goals    . DIET - INCREASE WATER INTAKE     Place water/fluids in line of vision to help remind to drink       This is a list of the screening recommended for you and due dates:  Health Maintenance  Topic Date Due  . Colon Cancer Screening  07/07/2017  . COVID-19 Vaccine (4 - Booster for Pfizer series) 03/24/2022*  . Flu Shot  06/19/2021  . Hemoglobin A1C  07/12/2021  . Complete foot exam   09/15/2021  . Mammogram  11/08/2021  . Eye exam for diabetics  11/17/2021  . Tetanus Vaccine  04/19/2029  . DEXA scan (bone density measurement)  Completed  .  Hepatitis C: One time screening is recommended by Center for Disease Control  (CDC) for  adults born from [birth year] through 1966.   Completed  . Pneumonia vaccines  Completed  . HPV Vaccine  Aged Out  *Topic was postponed. The date shown is not the original due date.    Immunizations Immunization History  Administered Date(s) Administered  . Fluad Quad(high Dose 65+) 08/25/2019, 09/12/2020  . Influenza Split 09/25/2012  . Influenza Whole 09/07/2013  . Influenza, High Dose Seasonal PF 08/13/2016, 09/02/2017, 08/22/2018  . Influenza,inj,Quad PF,6+ Mos 07/27/2014, 08/25/2015  . PFIZER(Purple Top)SARS-COV-2 Vaccination 12/29/2019, 01/19/2020, 09/02/2020  . Pneumococcal Conjugate-13 01/18/2014  . Pneumococcal Polysaccharide-23 03/15/2012  . Tdap 02/28/2012, 04/20/2019  . Zoster 08/27/2012  . Zoster Recombinat (Shingrix) 10/14/2019, 12/21/2019   Keep all routine maintenance appointments.   Next scheduled fasting lab 05/12/21 @ 9:30  Follow up 05/16/21 @ 10:30  Advanced directives: on file  Conditions/risks identified: none new  Follow up in one year for your annual  wellness visit    Preventive Care 65 Years and Older, Female Preventive care refers to lifestyle choices and visits with your health care provider that can promote health and wellness. What does preventive care include?  A yearly physical exam. This is also called an annual well check.  Dental exams once or twice a year.  Routine eye exams. Ask your health care provider how often you should have your eyes checked.  Personal lifestyle choices, including:  Daily care of your teeth and gums.  Regular physical activity.  Eating a healthy diet.  Avoiding tobacco and drug use.  Limiting alcohol use.  Practicing safe sex.  Taking low-dose aspirin every day.  Taking vitamin and mineral supplements as recommended by your health care provider. What happens during an annual well check? The services and screenings done by your health care provider during your annual well check will depend on your age, overall health, lifestyle risk factors, and family history of disease. Counseling  Your health care provider may ask you questions about your:  Alcohol use.  Tobacco use.  Drug use.  Emotional well-being.  Home and relationship well-being.  Sexual activity.  Eating habits.  History of falls.  Memory and ability to understand (cognition).  Work and work Statistician.  Reproductive health. Screening  You may have the following tests or measurements:  Height, weight, and BMI.  Blood pressure.  Lipid and cholesterol levels. These may be checked every 5 years, or more frequently if you are over  58 years old.  Skin check.  Lung cancer screening. You may have this screening every year starting at age 75 if you have a 30-pack-year history of smoking and currently smoke or have quit within the past 15 years.  Fecal occult blood test (FOBT) of the stool. You may have this test every year starting at age 33.  Flexible sigmoidoscopy or colonoscopy. You may have a  sigmoidoscopy every 5 years or a colonoscopy every 10 years starting at age 29.  Hepatitis C blood test.  Hepatitis B blood test.  Sexually transmitted disease (STD) testing.  Diabetes screening. This is done by checking your blood sugar (glucose) after you have not eaten for a while (fasting). You may have this done every 1-3 years.  Bone density scan. This is done to screen for osteoporosis. You may have this done starting at age 56.  Mammogram. This may be done every 1-2 years. Talk to your health care provider about how often you should have regular mammograms. Talk with your health care provider about your test results, treatment options, and if necessary, the need for more tests. Vaccines  Your health care provider may recommend certain vaccines, such as:  Influenza vaccine. This is recommended every year.  Tetanus, diphtheria, and acellular pertussis (Tdap, Td) vaccine. You may need a Td booster every 10 years.  Zoster vaccine. You may need this after age 17.  Pneumococcal 13-valent conjugate (PCV13) vaccine. One dose is recommended after age 33.  Pneumococcal polysaccharide (PPSV23) vaccine. One dose is recommended after age 63. Talk to your health care provider about which screenings and vaccines you need and how often you need them. This information is not intended to replace advice given to you by your health care provider. Make sure you discuss any questions you have with your health care provider. Document Released: 12/02/2015 Document Revised: 07/25/2016 Document Reviewed: 09/06/2015 Elsevier Interactive Patient Education  2017 [employer] Prevention in the Home Falls can cause injuries. They can happen to people of all ages. There are many things you can do to make your home safe and to help prevent falls. What can I do on the outside of my home?  Regularly fix the edges of walkways and driveways and fix any cracks.  Remove anything that might make you  trip as you walk through a door, such as a raised step or threshold.  Trim any bushes or trees on the path to your home.  Use bright outdoor lighting.  Clear any walking paths of anything that might make someone trip, such as rocks or tools.  Regularly check to see if handrails are loose or broken. Make sure that both sides of any steps have handrails.  Any raised decks and porches should have guardrails on the edges.  Have any leaves, snow, or ice cleared regularly.  Use sand or salt on walking paths during winter.  Clean up any spills in your garage right away. This includes oil or grease spills. What can I do in the bathroom?  Use night lights.  Install grab bars by the toilet and in the tub and shower. Do not use towel bars as grab bars.  Use non-skid mats or decals in the tub or shower.  If you need to sit down in the shower, use a plastic, non-slip stool.  Keep the floor dry. Clean up any water that spills on the floor as soon as it happens.  Remove soap buildup in the tub or shower regularly.  Attach bath mats securely with double-sided non-slip rug tape.  Do not have throw rugs and other things on the floor that can make you trip. What can I do in the bedroom?  Use night lights.  Make sure that you have a light by your bed that is easy to reach.  Do not use any sheets or blankets that are too big for your bed. They should not hang down onto the floor.  Have a firm chair that has side arms. You can use this for support while you get dressed.  Do not have throw rugs and other things on the floor that can make you trip. What can I do in the kitchen?  Clean up any spills right away.  Avoid walking on wet floors.  Keep items that you use a lot in easy-to-reach places.  If you need to reach something above you, use a strong step stool that has a grab bar.  Keep electrical cords out of the way.  Do not use floor polish or wax that makes floors slippery. If  you must use wax, use non-skid floor wax.  Do not have throw rugs and other things on the floor that can make you trip. What can I do with my stairs?  Do not leave any items on the stairs.  Make sure that there are handrails on both sides of the stairs and use them. Fix handrails that are broken or loose. Make sure that handrails are as long as the stairways.  Check any carpeting to make sure that it is firmly attached to the stairs. Fix any carpet that is loose or worn.  Avoid having throw rugs at the top or bottom of the stairs. If you do have throw rugs, attach them to the floor with carpet tape.  Make sure that you have a light switch at the top of the stairs and the bottom of the stairs. If you do not have them, ask someone to add them for you. What else can I do to help prevent falls?  Wear shoes that:  Do not have high heels.  Have rubber bottoms.  Are comfortable and fit you well.  Are closed at the toe. Do not wear sandals.  If you use a stepladder:  Make sure that it is fully opened. Do not climb a closed stepladder.  Make sure that both sides of the stepladder are locked into place.  Ask someone to hold it for you, if possible.  Clearly mark and make sure that you can see:  Any grab bars or handrails.  First and last steps.  Where the edge of each step is.  Use tools that help you move around (mobility aids) if they are needed. These include:  Canes.  Walkers.  Scooters.  Crutches.  Turn on the lights when you go into a dark area. Replace any light bulbs as soon as they burn out.  Set up your furniture so you have a clear path. Avoid moving your furniture around.  If any of your floors are uneven, fix them.  If there are any pets around you, be aware of where they are.  Review your medicines with your doctor. Some medicines can make you feel dizzy. This can increase your chance of falling. Ask your doctor what other things that you can do to  help prevent falls. This information is not intended to replace advice given to you by your health care provider. Make sure you discuss any questions you have  with your health care provider. Document Released: 09/01/2009 Document Revised: 04/12/2016 Document Reviewed: 12/10/2014 Elsevier Interactive Patient Education  2017 [employer].

## 2021-03-08 NOTE — Progress Notes (Signed)
Subjective:   Erin Camacho is a 75 y.o. female who presents for Medicare Annual (Subsequent) preventive examination.  Review of Systems    No ROS.  Medicare Wellness Virtual Visit.   Cardiac Risk Factors include: hypertension;diabetes mellitus;advanced age (>17men, >90 women)     Objective:    Today's Vitals   03/08/21 1414  BP: 134/81  Pulse: 72  Resp: 16  Temp: (!) 97.3 F (36.3 C)  TempSrc: Oral  Weight: 196 lb 6.4 oz (89.1 kg)  Height: $Remove'5\' 5"'ubjBOpi$  (1.651 m)   Body mass index is 32.68 kg/m.  Advanced Directives 03/08/2021 12/30/2020 12/29/2020 11/28/2020 01/06/2020 08/31/2019 08/31/2019  Does Patient Have a Medical Advance Directive? Yes Yes Yes Yes Yes Yes Yes  Type of Paramedic of [place];Living will Living will;Healthcare Power of Attorney Living will;Healthcare Power of Attorney Living will [place];Living will Living will Living will  Does patient want to make changes to medical advance directive? No - Patient declined No - Patient declined - No - Patient declined - No - Patient declined No - Patient declined  Copy of [place] in Chart? Yes - validated most recent copy scanned in chart (See row information) Yes - validated most recent copy scanned in chart (See row information) Yes - validated most recent copy scanned in chart (See row information) - No - copy requested - -  Would patient like information on creating a medical advance directive? - - - - - - -  Pre-existing out of facility DNR order (yellow form or pink MOST form) - - - - - - -    Current Medications (verified) Outpatient Encounter Medications as of 03/08/2021  Medication Sig  . acetaminophen (TYLENOL) 650 MG CR tablet Take 1,950 mg by mouth in the morning and at bedtime.  . ALPRAZolam (XANAX) 0.25 MG tablet Take 0.25 mg by mouth daily as needed for anxiety.  Marland Kitchen amoxicillin (AMOXIL) 500 MG capsule Take 2,000 mg by mouth See admin instructions. Take  4 capsules (2000 mg) by mouth 1 hour prior to dental appointments.  . Calcium Carb-Cholecalciferol (CALCIUM 600+D3 PO) Take 1 tablet by mouth daily.  . Camphor-Menthol-Methyl Sal (SALONPAS) 3.11-24-08 % PTCH Place 1 patch onto the skin daily as needed (pain.).  Marland Kitchen cholecalciferol (VITAMIN D) 25 MCG (1000 UNIT) tablet Take 1,000 Units by mouth daily.  . Ferrous Sulfate (IRON) 325 (65 Fe) MG TABS Take 325 mg by mouth daily.  . fexofenadine (ALLEGRA) 180 MG tablet Take 1 tablet (180 mg total) by mouth daily.  . fluticasone (FLONASE) 50 MCG/ACT nasal spray SPRAY 2 SPRAYS INTO EACH NOSTRIL EVERY DAY  . Liniments (SALONPAS ARTHRITIS PAIN RELIEF EX) Apply 1 application topically 3 (three) times daily as needed (pain.).  Marland Kitchen losartan-hydrochlorothiazide (HYZAAR) 50-12.5 MG tablet TAKE 1 TABLET BY MOUTH EVERY DAY  . magnesium oxide (MAG-OX) 400 MG tablet TAKE 1 TABLET BY MOUTH EVERY DAY  . metFORMIN (GLUCOPHAGE) 500 MG tablet TAKE 1 TABLET BY MOUTH EVERY DAY  . metoprolol succinate (TOPROL-XL) 25 MG 24 hr tablet TAKE 1/2 TABLET BY MOUTH EVERY DAY (Patient not taking: Reported on 03/08/2021)  . Multiple Vitamin (MULTIVITAMIN WITH MINERALS) TABS tablet Take 1 tablet by mouth daily.  Glory Rosebush VERIO test strip USE TO CHECK BLOOD SUGAR TWICE A DAY  . pantoprazole (PROTONIX) 40 MG tablet TAKE 1 TABLET (40 MG TOTAL) BY MOUTH 2 (TWO) TIMES DAILY BEFORE A MEAL.  Vladimir Faster Glycol-Propyl Glycol (LUBRICANT EYE DROPS) 0.4-0.3 %  SOLN Place 1 drop into both eyes 3 (three) times daily as needed (dry/irritated eyes.).  Marland Kitchen sertraline (ZOLOFT) 100 MG tablet TAKE 1/2 TABLET BY MOUTH (50 MG) BY MOUTH DAILY  . simvastatin (ZOCOR) 10 MG tablet TAKE 1/2 TABLET (5 MG TOTAL) BY MOUTH AT BEDTIME.  Marland Kitchen tiZANidine (ZANAFLEX) 2 MG tablet TAKE 1 TABLET (2 MG TOTAL) BY MOUTH 2 (TWO) TIMES DAILY AS NEEDED FOR MUSCLE SPASMS.  Marland Kitchen traMADol (ULTRAM) 50 MG tablet Take 50 mg by mouth every 12 (twelve) hours as needed.  . traZODone (DESYREL) 100 MG  tablet TAKE 1 TABLET BY MOUTH EVERYDAY AT BEDTIME   No facility-administered encounter medications on file as of 03/08/2021.    Allergies (verified) Adhesive [tape]   History: Past Medical History:  Diagnosis Date  . Allergy    hay fever  . Anxiety   . Arthritis   . Chronic back pain   . Diabetes mellitus   . Dyspnea    a. 07/2017 Echo: EF 60-65%. No rwma. Mildly dil LA. Nl RV fxn.  Marland Kitchen GERD (gastroesophageal reflux disease)   . History of hiatal hernia   . Hyperlipidemia   . Hypertension   . Osteoarthritis   . Pneumonia   . Spinal stenosis   . Type 2 diabetes mellitus ([place])   . UTI (lower urinary tract infection)    Past Surgical History:  Procedure Laterality Date  . ABDOMINAL HYSTERECTOMY  1973  . ABDOMINAL HYSTERECTOMY    . APPENDECTOMY    . APPLICATION OF ROBOTIC ASSISTANCE FOR SPINAL PROCEDURE N/A 09/15/2018   Procedure: APPLICATION OF ROBOTIC ASSISTANCE FOR SPINAL PROCEDURE;  Surgeon: Kristeen Miss, MD;  Location: [place];  Service: Neurosurgery;  Laterality: N/A;  . BACK SURGERY  04/17/13, 2017, 2019  . CATARACT EXTRACTION W/PHACO Left 12/16/2019   Procedure: CATARACT EXTRACTION PHACO AND INTRAOCULAR LENS PLACEMENT (IOC) LEFT PANOPTIX LENS DIABETIC 8.07 01:19.2 10.2%;  Surgeon: Leandrew Koyanagi, MD;  Location: [place];  Service: Ophthalmology;  Laterality: Left;  Diabetic - oral meds  . CATARACT EXTRACTION W/PHACO Right 01/06/2020   Procedure: CATARACT EXTRACTION PHACO AND INTRAOCULAR LENS PLACEMENT (IOC) PANOPTIX LENS RIGHT DIABETIC 10.73 01:08.6 15.7%;  Surgeon: Leandrew Koyanagi, MD;  Location: [place];  Service: Ophthalmology;  Laterality: Right;  diabetic  . CHOLECYSTECTOMY  1995  . EYE SURGERY  2021   bilateral cataract removal with lens implants  . HERNIA REPAIR  08/2006  . lap band  08/2003  . LUMBAR LAMINECTOMY/ DECOMPRESSION WITH MET-RX Left 12/29/2020   Procedure: Left Thoracic Seven-Eight Microdiscectomy;  Surgeon: Kristeen Miss,  MD;  Location: [place];  Service: Neurosurgery;  Laterality: Left;  . SEPTOPLASTY  04/1978  . TONSILLECTOMY AND ADENOIDECTOMY  1979  . TOTAL HIP ARTHROPLASTY  02/2012   Right, Dr. Marry Guan  . TOTAL HIP ARTHROPLASTY Left 08/31/2019   Procedure: TOTAL HIP ARTHROPLASTY;  Surgeon: Dereck Leep, MD;  Location: [facility];  Service: Orthopedics;  Laterality: Left;   Family History  Problem Relation Age of Onset  . Hypertension Mother   . Stroke Mother   . Heart disease Mother   . Hypertension Father   . Stroke Father   . Heart disease Father   . Arthritis Sister   . Diabetes Sister   . Cancer Sister   . Diabetes Brother   . Hypertension Brother   . Dementia Brother   . Diabetes Brother   . Hypertension Brother   . Dementia Brother   . Breast cancer Other 47  .  Cancer Other        breast  . Diabetes Other   . Kidney disease Neg Hx   . Bladder Cancer Neg Hx    Social History   Socioeconomic History  . Marital status: Married    Spouse name: Not on file  . Number of children: Not on file  . Years of education: Not on file  . Highest education level: Not on file  Occupational History  . Not on file  Tobacco Use  . Smoking status: Never Smoker  . Smokeless tobacco: Never Used  Vaping Use  . Vaping Use: Never used  Substance and Sexual Activity  . Alcohol use: Not Currently    Alcohol/week: 0.0 - 1.0 standard drinks    Comment: occ  . Drug use: No  . Sexual activity: Not Currently  Other Topics Concern  . Not on file  Social History Narrative   Lives in [place] with husband.      Work - retired Sports administrator   Social Determinants of Radio broadcast assistant Strain: [place]   . Difficulty of Paying Living Expenses: Not hard at all  Food Insecurity: No Food Insecurity  . Worried About Charity fundraiser in the Last Year: Never true  . Ran Out of Food in the Last Year: Never true  Transportation Needs: No Transportation Needs  . Lack of Transportation  (Medical): No  . Lack of Transportation (Non-Medical): No  Physical Activity: Unknown  . Days of Exercise per Week: 0 days  . Minutes of Exercise per Session: Not on file  Stress: No Stress Concern Present  . Feeling of Stress : Not at all  Social Connections: Unknown  . Frequency of Communication with Friends and Family: Not on file  . Frequency of Social Gatherings with Friends and Family: Not on file  . Attends Religious Services: Not on file  . Active Member of Clubs or Organizations: Not on file  . Attends Archivist Meetings: Not on file  . Marital Status: Married    Tobacco Counseling Counseling given: Not Answered   Clinical Intake:  Pre-visit preparation completed: Yes        Diabetes: Yes (Followed by pcp)  How often do you need to have someone help you when you read instructions, pamphlets, or other written materials from your doctor or pharmacy?: 1 - Never  Nutrition Risk Assessment: Has the patient had any N/V/D within the last 2 months?  No  Does the patient have any non-healing wounds?  No  Has the patient had any unintentional weight loss or weight gain?  No   Diabetes: If diabetic, was a CBG obtained today?  No  Did the patient bring in their glucometer from home?  No  How often do you monitor your CBG's? Twice daily.   Financial Strains and Diabetes Management: Are you having any financial strains with the device, your supplies or your medication? No.  Does the patient want to be seen by Chronic Care Management for management of their diabetes?  No  Would the patient like to be referred to a Nutritionist or for Diabetic Management?  No    Interpreter Needed?: No      Activities of Daily Living In your present state of health, do you have any difficulty performing the following activities: 03/08/2021 12/30/2020  Hearing? N N  Vision? N N  Difficulty concentrating or making decisions? N N  Walking or climbing stairs? Y N  Comment  Chronic back pain -  Dressing or bathing? N N  Doing errands, shopping? N N  Preparing Food and eating ? N -  Using the Toilet? N -  In the past six months, have you accidently leaked urine? N -  Do you have problems with loss of bowel control? N -  Managing your Medications? N -  Managing your Finances? N -  Housekeeping or managing your Housekeeping? Y -  Comment Maid assist -  Some recent data might be hidden    Patient Care Team: [name], MD as PCP - General (Internal Medicine) Minna Merritts, MD as PCP - Cardiology (Cardiology)  Indicate any recent Medical Services you may have received from other than [facility] providers in the past year (date may be approximate).     Assessment:   This is a routine wellness examination for [facility].  Hearing/Vision screen  Hearing Screening   '125Hz'$  $Remo'250Hz'qVrBD$'500Hz'$'1000Hz'$'2000Hz'$'3000Hz'$'4000Hz'$'6000Hz'$'8000Hz'$   Right ear:           Left ear:           Comments: Patient is able to hear conversational tones without difficulty. No issues reported  Vision Screening Comments: Follow[facility] by [facility]  Wears corrective lenses  Cataract extracted, bilateral No retinopathy reported  Dietary issues and exercise activities discussed: Current Exercise Habits: The patient does not participate in regular exercise at present  Low carb diet God water intake  Goals    . DIET - INCREASE WATER INTAKE     Place water/fluids in line of vision to help remind to drink      Depression Screen PHQ 2/9 Scores 03/08/2021 09/15/2020 04/20/2020 02/25/2020 08/25/2019 12/01/2018 10/20/2018  PHQ - 2 Score 0 0 0 0 0 0 0  PHQ- 9 Score - 0 - 0 0 - -  Exception Documentation - - Patient refusal - - - -    Fall Risk Fall Risk  03/08/2021 05/16/2020 04/20/2020 02/25/2020 02/12/2019  Falls in the past year? 0 0 0 0 (No Data)  Comment - - - - no falls since previous visit  Number falls in past yr: 0 0 0 - -  Injury with Fall? 0 0 0 - -  Risk for fall due to : - (No Data)  - - -  Risk for fall due to: Comment - uses can when walking long distances - - -  Follow up Falls evaluation completed - Falls evaluation completed Falls evaluation completed -    FALL RISK PREVENTION PERTAINING TO THE HOME: Handrails in use when climbing stairs? Yes Home free of loose throw rugs in walkways, pet beds, electrical cords, etc? Yes  Adequate lighting in your home to reduce risk of falls? Yes   ASSISTIVE DEVICES UTILIZED TO PREVENT FALLS: Life alert? No  Use of a cane, walker or w/c? No  Grab bars in the bathroom? Yes  Shower chair or bench in shower? Yes  Elevated toilet seat or a handicapped toilet? Yes   TIMED UP AND GO: Was the test performed? Yes .  Length of time to ambulate 10 feet: 10 sec.   Gait steady and fast without use of assistive device  Cognitive Function: Patient is alert and oriented x3.  Denies difficulty focusing, making decisions, memory loss.  Enjoys reading and plays online brain health games.  MMSE/6CIT deferred. Normal by direct communication/observation.   MMSE - Mini Mental State Exam 11/28/2017 11/28/2016  Orientation to time 5 5  Orientation  to Place 5 5  Registration 3 3  Attention/ Calculation 5 5  Recall 3 3  Language- name 2 objects 2 2  Language- repeat 1 1  Language- follow 3 step command 3 3  Language- read & follow direction 1 1  Write a sentence 1 1  Copy design 1 1  Total score 30 30     6CIT Screen 03/08/2021 12/01/2018  What Year? 0 points 0 points  What month? 0 points 0 points  What time? 0 points 0 points  Count back from 20 0 points 0 points  Months in reverse 0 points 0 points  Repeat phrase 0 points 0 points  Total Score 0 0    Immunizations Immunization History  Administered Date(s) Administered  . Fluad Quad(high Dose 65+) 08/25/2019, 09/12/2020  . Influenza Split 09/25/2012  . Influenza Whole 09/07/2013  . Influenza, High Dose Seasonal PF 08/13/2016, 09/02/2017, 08/22/2018  . Influenza,inj,Quad  PF,6+ Mos 07/27/2014, 08/25/2015  . PFIZER(Purple Top)SARS-COV-2 Vaccination 12/29/2019, 01/19/2020, 09/02/2020  . Pneumococcal Conjugate-13 01/18/2014  . Pneumococcal Polysaccharide-23 03/15/2012  . Tdap 02/28/2012, 04/20/2019  . Zoster 08/27/2012  . Zoster Recombinat (Shingrix) 10/14/2019, 12/21/2019    Health Maintenance Health Maintenance  Topic Date Due  . COLONOSCOPY (Pts 45-1yrs Insurance coverage will need to be confirmed)  07/07/2017  . COVID-19 Vaccine (4 - Booster for Pfizer series) 03/24/2022 (Originally 03/03/2021)  . INFLUENZA VACCINE  06/19/2021  . HEMOGLOBIN A1C  07/12/2021  . FOOT EXAM  09/15/2021  . MAMMOGRAM  11/08/2021  . OPHTHALMOLOGY EXAM  11/17/2021  . TETANUS/TDAP  04/19/2029  . DEXA SCAN  Completed  . Hepatitis C Screening  Completed  . PNA vac Low Risk Adult  Completed  . HPV VACCINES  Aged Out   Colorectal cancer screening: Type of screening: Colonoscopy. Completed 2008. Repeat every 10 years . Scheduled 03/15/21.   Mammogram status: Completed 10/19/20. Repeat every year  Bone Density status: Completed 01/07/17. Results reflect: Bone density results: NORMAL. Repeat every 5 years.  Lung Cancer Screening: (Low Dose CT Chest recommended if Age 18-80 years, 30 pack-year currently smoking OR have quit w/in 15years.) does not qualify.   Vision Screening: Recommended annual ophthalmology exams for early detection of glaucoma and other disorders of the eye. Is the patient up to date with their annual eye exam?  Yes   Dental Screening: Recommended annual dental exams for proper oral hygiene. Visits every 6 months.   Community Resource Referral / Chronic Care Management: CRR required this visit?  No   CCM required this visit?  No      Plan:   Keep all routine maintenance appointments.   Next scheduled fasting lab 05/12/21 @ 9:30  Follow up 05/16/21 @ 10:30  I have personally reviewed and noted the following in the patient's chart:   . Medical and  social history . Use of alcohol, tobacco or illicit drugs  . Current medications and supplements . Functional ability and status . Nutritional status . Physical activity . Advanced directives . List of other physicians . Hospitalizations, surgeries, and ER visits in previous 12 months . Vitals . Screenings to include cognitive, depression, and falls . Referrals and appointments  In addition, I have reviewed and discussed with patient certain preventive protocols, quality metrics, and best practice recommendations. A written personalized care plan for preventive services as well as general preventive health recommendations were provided to patient.     Varney Biles, LPN   [id number]

## 2021-03-11 ENCOUNTER — Other Ambulatory Visit: Payer: Self-pay | Admitting: Internal Medicine

## 2021-03-14 DIAGNOSIS — J301 Allergic rhinitis due to pollen: Secondary | ICD-10-CM | POA: Diagnosis not present

## 2021-03-15 ENCOUNTER — Ambulatory Visit: Payer: Medicare HMO | Admitting: Certified Registered Nurse Anesthetist

## 2021-03-15 ENCOUNTER — Encounter: Payer: Self-pay | Admitting: General Surgery

## 2021-03-15 ENCOUNTER — Other Ambulatory Visit: Payer: Self-pay

## 2021-03-15 ENCOUNTER — Encounter
Admission: RE | Disposition: A | Discharge status: Home / Self Care | Payer: Self-pay | Source: Home / Self Care | Attending: General Surgery

## 2021-03-15 ENCOUNTER — Ambulatory Visit
Admission: RE | Admit: 2021-03-15 | Discharge: 2021-03-15 | Disposition: A | Discharge status: Home / Self Care | Payer: Medicare HMO | Attending: General Surgery | Admitting: General Surgery

## 2021-03-15 DIAGNOSIS — D127 Benign neoplasm of rectosigmoid junction: Secondary | ICD-10-CM | POA: Insufficient documentation

## 2021-03-15 DIAGNOSIS — Z1211 Encounter for screening for malignant neoplasm of colon: Secondary | ICD-10-CM | POA: Diagnosis present

## 2021-03-15 DIAGNOSIS — D128 Benign neoplasm of rectum: Secondary | ICD-10-CM | POA: Diagnosis not present

## 2021-03-15 DIAGNOSIS — Z96643 Presence of artificial hip joint, bilateral: Secondary | ICD-10-CM | POA: Insufficient documentation

## 2021-03-15 DIAGNOSIS — Z888 Allergy status to other drugs, medicaments and biological substances status: Secondary | ICD-10-CM | POA: Diagnosis not present

## 2021-03-15 DIAGNOSIS — Z9049 Acquired absence of other specified parts of digestive tract: Secondary | ICD-10-CM | POA: Diagnosis not present

## 2021-03-15 DIAGNOSIS — K635 Polyp of colon: Secondary | ICD-10-CM | POA: Diagnosis not present

## 2021-03-15 DIAGNOSIS — Z7984 Long term (current) use of oral hypoglycemic drugs: Secondary | ICD-10-CM | POA: Diagnosis not present

## 2021-03-15 DIAGNOSIS — K5730 Diverticulosis of large intestine without perforation or abscess without bleeding: Secondary | ICD-10-CM | POA: Diagnosis not present

## 2021-03-15 DIAGNOSIS — Z79899 Other long term (current) drug therapy: Secondary | ICD-10-CM | POA: Insufficient documentation

## 2021-03-15 HISTORY — PX: COLONOSCOPY WITH PROPOFOL: SHX5780

## 2021-03-15 LAB — GLUCOSE, CAPILLARY: Glucose-Capillary: 126 mg/dL — ABNORMAL HIGH (ref 70–99)

## 2021-03-15 SURGERY — COLONOSCOPY WITH PROPOFOL
Anesthesia: General

## 2021-03-15 MED ORDER — LIDOCAINE HCL (PF) 2 % IJ SOLN
INTRAMUSCULAR | Status: AC
  Filled 2021-03-15: qty 5

## 2021-03-15 MED ORDER — GLYCOPYRROLATE 0.2 MG/ML IJ SOLN
INTRAMUSCULAR | Status: AC
  Filled 2021-03-15: qty 1

## 2021-03-15 MED ORDER — GLYCOPYRROLATE 0.2 MG/ML IJ SOLN
INTRAMUSCULAR | Status: DC | PRN
  Administered 2021-03-15: .1 mg via INTRAVENOUS

## 2021-03-15 MED ORDER — PROPOFOL 500 MG/50ML IV EMUL
INTRAVENOUS | Status: DC | PRN
  Administered 2021-03-15: 160 ug/kg/min via INTRAVENOUS

## 2021-03-15 MED ORDER — LIDOCAINE HCL (CARDIAC) PF 100 MG/5ML IV SOSY
PREFILLED_SYRINGE | INTRAVENOUS | Status: DC | PRN
  Administered 2021-03-15: 100 mg via INTRAVENOUS

## 2021-03-15 MED ORDER — SODIUM CHLORIDE 0.9 % IV SOLN: INTRAVENOUS | Status: DC

## 2021-03-15 MED ORDER — PROPOFOL 10 MG/ML IV BOLUS
INTRAVENOUS | Status: DC | PRN
  Administered 2021-03-15: 20 mg via INTRAVENOUS
  Administered 2021-03-15: 60 mg via INTRAVENOUS

## 2021-03-15 NOTE — Transfer of Care (Signed)
Immediate Anesthesia Transfer of Care Note  Patient: Erin Camacho  Procedure(s) Performed: COLONOSCOPY WITH PROPOFOL (N/A )  Patient Location: PACU  Anesthesia Type:General  Level of Consciousness: drowsy  Airway & Oxygen Therapy: Patient Spontanous Breathing  Post-op Assessment: Report given to RN and Post -op Vital signs reviewed and stable  Post vital signs: Reviewed and stable  Last Vitals:  Vitals Value Taken Time  BP    Temp    Pulse 78 03/15/21 0929  Resp 16 03/15/21 0929  SpO2 98 % 03/15/21 0929  Vitals shown include unvalidated device data.  Last Pain: There were no vitals filed for this visit.       Complications: No complications documented.

## 2021-03-15 NOTE — Anesthesia Preprocedure Evaluation (Addendum)
Anesthesia Evaluation  Patient identified by MRN, date of birth, ID band Patient awake    Reviewed: Allergy & Precautions, H&P , NPO status , Patient's Chart, lab work & pertinent test results  History of Anesthesia Complications Negative for: history of anesthetic complications  Airway Mallampati: III  TM Distance: <3 FB Neck ROM: limited    Dental  (+) Chipped   Pulmonary shortness of breath and with exertion, pneumonia,    Pulmonary exam normal        Cardiovascular Exercise Tolerance: Good hypertension, (-) anginaNormal cardiovascular exam     Neuro/Psych PSYCHIATRIC DISORDERS  Neuromuscular disease    GI/Hepatic Neg liver ROS, hiatal hernia, GERD  Medicated and Controlled,  Endo/Other  diabetes, Type 2  Renal/GU negative Renal ROS  negative genitourinary   Musculoskeletal  (+) Arthritis ,   Abdominal   Peds  Hematology negative hematology ROS (+)   Anesthesia Other Findings Past Medical History: No date: Allergy     Comment:  hay fever No date: Anxiety No date: Arthritis No date: Chronic back pain No date: Diabetes mellitus No date: Dyspnea     Comment:  a. 07/2017 Echo: EF 60-65%. No rwma. Mildly dil LA. Nl RV              fxn. No date: GERD (gastroesophageal reflux disease) No date: History of hiatal hernia No date: Hyperlipidemia No date: Hypertension No date: Osteoarthritis No date: Pneumonia No date: Spinal stenosis No date: Type 2 diabetes mellitus ([facility]) No date: UTI (lower urinary tract infection)  Past Surgical History: 1973: ABDOMINAL HYSTERECTOMY No date: ABDOMINAL HYSTERECTOMY No date: APPENDECTOMY 64/33/2951: APPLICATION OF ROBOTIC ASSISTANCE FOR SPINAL PROCEDURE; N/ A     Comment:  Procedure: APPLICATION OF ROBOTIC ASSISTANCE FOR SPINAL               PROCEDURE;  Surgeon: Kristeen Miss, MD;  Location: [place];              Service: Neurosurgery;  Laterality: N/A; 04/17/13, 2017,  2019: BACK SURGERY 12/16/2019: CATARACT EXTRACTION W/PHACO; Left     Comment:  Procedure: CATARACT EXTRACTION PHACO AND INTRAOCULAR               LENS PLACEMENT (IOC) LEFT PANOPTIX LENS DIABETIC 8.07               01:19.2 10.2%;  Surgeon: Leandrew Koyanagi, MD;                Location: [place];  Service: Ophthalmology;                Laterality: Left;  Diabetic - oral meds 01/06/2020: CATARACT EXTRACTION W/PHACO; Right     Comment:  Procedure: CATARACT EXTRACTION PHACO AND INTRAOCULAR               LENS PLACEMENT (IOC) PANOPTIX LENS RIGHT DIABETIC 10.73               01:08.6 15.7%;  Surgeon: Leandrew Koyanagi, MD;                Location: [place];  Service: Ophthalmology;                Laterality: Right;  diabetic 1995: CHOLECYSTECTOMY 2021: EYE SURGERY     Comment:  bilateral cataract removal with lens implants 08/2006: HERNIA REPAIR 08/2003: lap band 12/29/2020: LUMBAR LAMINECTOMY/ DECOMPRESSION WITH MET-RX; Left     Comment:  Procedure: Left Thoracic Seven-Eight Microdiscectomy;  Surgeon: Kristeen Miss, MD;  Location: [place];  Service:               Neurosurgery;  Laterality: Left; 04/1978: SEPTOPLASTY 1979: TONSILLECTOMY AND ADENOIDECTOMY 02/2012: TOTAL HIP ARTHROPLASTY     Comment:  Right, Dr. Marry Guan 08/31/2019: TOTAL HIP ARTHROPLASTY; Left     Comment:  Procedure: TOTAL HIP ARTHROPLASTY;  Surgeon: Dereck Leep, MD;  Location: [facility];  Service: Orthopedics;               Laterality: Left;     Reproductive/Obstetrics negative OB ROS                             Anesthesia Physical Anesthesia Plan  ASA: III  Anesthesia Plan: General   Post-op Pain Management:    Induction: Intravenous  PONV Risk Score and Plan: Propofol infusion and TIVA  Airway Management Planned: Natural Airway and Nasal Cannula  Additional Equipment:   Intra-op Plan:   Post-operative Plan:   Informed  Consent: I have reviewed the patients History and Physical, chart, labs and discussed the procedure including the risks, benefits and alternatives for the proposed anesthesia with the patient or authorized representative who has indicated his/her understanding and acceptance.     Dental Advisory Given  Plan Discussed with: Anesthesiologist, CRNA and Surgeon  Anesthesia Plan Comments: (Patient consented for risks of anesthesia including but not limited to:  - adverse reactions to medications - risk of airway placement if required - damage to eyes, teeth, lips or other oral mucosa - nerve damage due to positioning  - sore throat or hoarseness - Damage to heart, brain, nerves, lungs, other parts of body or loss of life  Patient voiced understanding.)        Anesthesia Quick Evaluation

## 2021-03-15 NOTE — Op Note (Addendum)
[facility] Gastroenterology Patient Name: Erin Camacho Procedure Date: 03/15/2021 8:52 AM MRN: [id number] Account #: [id number] Date of Birth: June 06, 1946 Admit Type: Outpatient Age: 75 Room: [facility] ENDO ROOM 1 Gender: Female Note Status: Supervisor Override Procedure:             Colonoscopy Indications:           Screening for colorectal malignant neoplasm Providers:             Robert Bellow, MD Referring MD:          [name], MD (Referring MD) Medicines:             Propofol per Anesthesia Complications:         No immediate complications. Procedure:             Pre-Anesthesia Assessment:                        - Prior to the procedure, a History and Physical was                         performed, and patient medications, allergies and                         sensitivities were reviewed. [name] tolerance                         of previous anesthesia was reviewed.                        - The risks and benefits of the procedure and the                         sedation options and risks were discussed with the                         patient. All questions were answered and informed                         consent was obtained.                        After obtaining informed consent, the colonoscope was                         passed under direct vision. Throughout the procedure,                         the patient's blood pressure, pulse, and oxygen                         saturations were monitored continuously. The                         Colonoscope was introduced through the anus and                         advanced to the the cecum, identified by appendiceal                         orifice and  ileocecal valve. The colonoscopy was                         somewhat difficult due to a tortuous colon. Successful                         completion of the procedure was aided by using manual                         pressure. The patient tolerated  the procedure well.                         The quality of the bowel preparation was excellent. Findings:      A 6 mm polyp was found in the recto-sigmoid colon. The polyp was       sessile. The polyp was removed with a cold snare. Resection and       retrieval were complete.      A few medium-mouthed diverticula were found in the recto-sigmoid colon       and sigmoid colon.      The retroflexed view of the distal rectum and anal verge was normal and       showed no anal or rectal abnormalities. Impression:            - One 6 mm polyp at the recto-sigmoid colon, removed                         with a cold snare. Resected and retrieved.                        - Diverticulosis in the recto-sigmoid colon and in the                         sigmoid colon.                        - The distal rectum and anal verge are normal on                         retroflexion view. Recommendation:        - Telephone endoscopist for pathology results in 1                         week. Procedure Code(s):     --- Professional ---                        [phone], Colonoscopy, flexible; with removal of                         tumor(s), polyp(s), or other lesion(s) by snare                         technique Diagnosis Code(s):     --- Professional ---                        Z12.11, Encounter for screening for malignant neoplasm  of colon                        K63.5, Polyp of colon                        K57.30, Diverticulosis of large intestine without                         perforation or abscess without bleeding CPT copyright 2019 American Medical Association. All rights reserved. The codes documented in this report are preliminary and upon coder review may  be revised to meet current compliance requirements. Robert Bellow, MD 03/15/2021 9:27:57 AM This report has been signed electronically. Number of Addenda: 0 Note Initiated On: 03/15/2021 8:52 AM Scope Withdrawal Time: 0 hours  13 minutes 5 seconds  Total Procedure Duration: 0 hours 23 minutes 59 seconds  Estimated Blood Loss:  Estimated blood loss was minimal.      [facility]

## 2021-03-15 NOTE — Anesthesia Postprocedure Evaluation (Signed)
Anesthesia Post Note  Patient: Erin Camacho  Procedure(s) Performed: COLONOSCOPY WITH PROPOFOL (N/A )  Patient location during evaluation: Endoscopy Anesthesia Type: General Level of consciousness: awake and alert Pain management: pain level controlled Vital Signs Assessment: post-procedure vital signs reviewed and stable Respiratory status: spontaneous breathing, nonlabored ventilation, respiratory function stable and patient connected to nasal cannula oxygen Cardiovascular status: blood pressure returned to baseline and stable Postop Assessment: no apparent nausea or vomiting Anesthetic complications: no   No complications documented.   Last Vitals:  Vitals:   03/15/21 0949 03/15/21 0959  BP: (!) 158/78 (!) 172/91  Pulse: 67 60  Resp: 19 14  Temp:    SpO2: 100% 100%    Last Pain:  Vitals:   03/15/21 0959  TempSrc:   PainSc: 0-No pain                 Precious Haws Nekeisha Aure

## 2021-03-15 NOTE — H&P (Signed)
Erin Camacho [id number] [birth year]     HPI:  75 y/o woman for a screening colonoscopy. Tolerated prep without N/V.    Medications Prior to Admission  Medication Sig Dispense Refill Last Dose  . acetaminophen (TYLENOL) 650 MG CR tablet Take 1,950 mg by mouth in the morning and at bedtime.   03/15/2021 at Unknown time  . cholecalciferol (VITAMIN D) 25 MCG (1000 UNIT) tablet Take 1,000 Units by mouth daily.   03/15/2021 at Unknown time  . Ferrous Sulfate (IRON) 325 (65 Fe) MG TABS Take 325 mg by mouth daily.   03/14/2021 at Unknown time  . fexofenadine (ALLEGRA) 180 MG tablet Take 1 tablet (180 mg total) by mouth daily. 90 tablet 4 03/15/2021 at Unknown time  . fluticasone (FLONASE) 50 MCG/ACT nasal spray SPRAY 2 SPRAYS INTO EACH NOSTRIL EVERY DAY 48 mL 1 03/14/2021 at Unknown time  . losartan-hydrochlorothiazide (HYZAAR) 50-12.5 MG tablet TAKE 1 TABLET BY MOUTH EVERY DAY 90 tablet 2 03/15/2021 at Unknown time  . magnesium oxide (MAG-OX) 400 MG tablet TAKE 1 TABLET BY MOUTH EVERY DAY 30 tablet 2 03/15/2021 at Unknown time  . metFORMIN (GLUCOPHAGE) 500 MG tablet TAKE 1 TABLET BY MOUTH EVERY DAY 90 tablet 1 Past Week at Unknown time  . Multiple Vitamin (MULTIVITAMIN WITH MINERALS) TABS tablet Take 1 tablet by mouth daily.   03/15/2021 at Unknown time  . pantoprazole (PROTONIX) 40 MG tablet TAKE 1 TABLET (40 MG TOTAL) BY MOUTH 2 (TWO) TIMES DAILY BEFORE A MEAL. 180 tablet 3 03/15/2021 at Unknown time  . sertraline (ZOLOFT) 100 MG tablet TAKE 1/2 TABLET BY MOUTH (50 MG) BY MOUTH DAILY 45 tablet 1 03/15/2021 at Unknown time  . simvastatin (ZOCOR) 10 MG tablet TAKE 1/2 TABLET (5 MG TOTAL) BY MOUTH AT BEDTIME. 45 tablet 3 03/14/2021 at Unknown time  . traZODone (DESYREL) 100 MG tablet TAKE 1 TABLET BY MOUTH EVERYDAY AT BEDTIME 90 tablet 1 03/14/2021 at Unknown time  . ALPRAZolam (XANAX) 0.25 MG tablet Take 0.25 mg by mouth daily as needed for anxiety.     Marland Kitchen amoxicillin (AMOXIL) 500 MG capsule Take 2,000 mg by mouth  See admin instructions. Take 4 capsules (2000 mg) by mouth 1 hour prior to dental appointments.     . Calcium Carb-Cholecalciferol (CALCIUM 600+D3 PO) Take 1 tablet by mouth daily.     . Camphor-Menthol-Methyl Sal (SALONPAS) 3.11-24-08 % PTCH Place 1 patch onto the skin daily as needed (pain.).     Marland Kitchen Liniments (SALONPAS ARTHRITIS PAIN RELIEF EX) Apply 1 application topically 3 (three) times daily as needed (pain.).     Marland Kitchen metoprolol succinate (TOPROL-XL) 25 MG 24 hr tablet TAKE 1/2 TABLET BY MOUTH EVERY DAY 45 tablet 1   . ONETOUCH VERIO test strip USE TO CHECK BLOOD SUGAR TWICE A DAY 100 strip 12   . Polyethyl Glycol-Propyl Glycol (LUBRICANT EYE DROPS) 0.4-0.3 % SOLN Place 1 drop into both eyes 3 (three) times daily as needed (dry/irritated eyes.).     Marland Kitchen tiZANidine (ZANAFLEX) 2 MG tablet TAKE 1 TABLET (2 MG TOTAL) BY MOUTH 2 (TWO) TIMES DAILY AS NEEDED FOR MUSCLE SPASMS. 60 tablet 1   . traMADol (ULTRAM) 50 MG tablet Take 50 mg by mouth every 12 (twelve) hours as needed.      Allergies  Allergen Reactions  . Adhesive [Tape] Itching    Honeycomb bandage - redness/itching   Past Medical History:  Diagnosis Date  . Allergy    hay fever  . Anxiety   .  Arthritis   . Chronic back pain   . Diabetes mellitus   . Dyspnea    a. 07/2017 Echo: EF 60-65%. No rwma. Mildly dil LA. Nl RV fxn.  Marland Kitchen GERD (gastroesophageal reflux disease)   . History of hiatal hernia   . Hyperlipidemia   . Hypertension   . Osteoarthritis   . Pneumonia   . Spinal stenosis   . Type 2 diabetes mellitus ([place])   . UTI (lower urinary tract infection)    Past Surgical History:  Procedure Laterality Date  . ABDOMINAL HYSTERECTOMY  1973  . ABDOMINAL HYSTERECTOMY    . APPENDECTOMY    . APPLICATION OF ROBOTIC ASSISTANCE FOR SPINAL PROCEDURE N/A 09/15/2018   Procedure: APPLICATION OF ROBOTIC ASSISTANCE FOR SPINAL PROCEDURE;  Surgeon: Kristeen Miss, MD;  Location: [place];  Service: Neurosurgery;  Laterality: N/A;  . BACK SURGERY   04/17/13, 2017, 2019  . CATARACT EXTRACTION W/PHACO Left 12/16/2019   Procedure: CATARACT EXTRACTION PHACO AND INTRAOCULAR LENS PLACEMENT (IOC) LEFT PANOPTIX LENS DIABETIC 8.07 01:19.2 10.2%;  Surgeon: Leandrew Koyanagi, MD;  Location: [place];  Service: Ophthalmology;  Laterality: Left;  Diabetic - oral meds  . CATARACT EXTRACTION W/PHACO Right 01/06/2020   Procedure: CATARACT EXTRACTION PHACO AND INTRAOCULAR LENS PLACEMENT (IOC) PANOPTIX LENS RIGHT DIABETIC 10.73 01:08.6 15.7%;  Surgeon: Leandrew Koyanagi, MD;  Location: [place];  Service: Ophthalmology;  Laterality: Right;  diabetic  . CHOLECYSTECTOMY  1995  . EYE SURGERY  2021   bilateral cataract removal with lens implants  . HERNIA REPAIR  08/2006  . lap band  08/2003  . LUMBAR LAMINECTOMY/ DECOMPRESSION WITH MET-RX Left 12/29/2020   Procedure: Left Thoracic Seven-Eight Microdiscectomy;  Surgeon: Kristeen Miss, MD;  Location: [place];  Service: Neurosurgery;  Laterality: Left;  . SEPTOPLASTY  04/1978  . TONSILLECTOMY AND ADENOIDECTOMY  1979  . TOTAL HIP ARTHROPLASTY  02/2012   Right, Dr. Marry Guan  . TOTAL HIP ARTHROPLASTY Left 08/31/2019   Procedure: TOTAL HIP ARTHROPLASTY;  Surgeon: Dereck Leep, MD;  Location: [facility];  Service: Orthopedics;  Laterality: Left;   Social History   Socioeconomic History  . Marital status: Married    Spouse name: Not on file  . Number of children: Not on file  . Years of education: Not on file  . Highest education level: Not on file  Occupational History  . Not on file  Tobacco Use  . Smoking status: Never Smoker  . Smokeless tobacco: Never Used  Vaping Use  . Vaping Use: Never used  Substance and Sexual Activity  . Alcohol use: Yes    Alcohol/week: 0.0 - 1.0 standard drinks    Comment: RARELY  . Drug use: No  . Sexual activity: Not Currently  Other Topics Concern  . Not on file  Social History Narrative   Lives in [place] with husband.      Work - retired  Sports administrator   Social Determinants of Radio broadcast assistant Strain: [place]   . Difficulty of Paying Living Expenses: Not hard at all  Food Insecurity: No Food Insecurity  . Worried About Charity fundraiser in the Last Year: Never true  . Ran Out of Food in the Last Year: Never true  Transportation Needs: No Transportation Needs  . Lack of Transportation (Medical): No  . Lack of Transportation (Non-Medical): No  Physical Activity: Unknown  . Days of Exercise per Week: 0 days  . Minutes of Exercise per Session: Not  on file  Stress: No Stress Concern Present  . Feeling of Stress : Not at all  Social Connections: Unknown  . Frequency of Communication with Friends and Family: Not on file  . Frequency of Social Gatherings with Friends and Family: Not on file  . Attends Religious Services: Not on file  . Active Member of Clubs or Organizations: Not on file  . Attends Archivist Meetings: Not on file  . Marital Status: Married  Human resources officer Violence: Not At Risk  . Fear of Current or Ex-Partner: No  . Emotionally Abused: No  . Physically Abused: No  . Sexually Abused: No   Social History   Social History Narrative   Lives in [place] with husband.      Work - retired Sports administrator     ROS: Negative.     PE: HEENT: Negative. Lungs: Clear. Cardio: RR.   Assessment/Plan:  Proceed with planned endoscopy.   Forest Gleason [facility] 03/15/2021

## 2021-03-16 ENCOUNTER — Other Ambulatory Visit: Payer: Self-pay

## 2021-03-16 ENCOUNTER — Encounter: Payer: Self-pay | Admitting: General Surgery

## 2021-03-16 LAB — SURGICAL PATHOLOGY

## 2021-03-16 MED ORDER — ONETOUCH DELICA LANCETS 30G MISC: 3 refills | Status: DC

## 2021-03-21 DIAGNOSIS — J301 Allergic rhinitis due to pollen: Secondary | ICD-10-CM | POA: Diagnosis not present

## 2021-03-25 ENCOUNTER — Other Ambulatory Visit: Payer: Medicare HMO

## 2021-03-27 ENCOUNTER — Ambulatory Visit
Admission: RE | Admit: 2021-03-27 | Discharge: 2021-03-27 | Disposition: A | Discharge status: Home / Self Care | Payer: Medicare HMO | Source: Ambulatory Visit | Attending: Neurological Surgery | Admitting: Neurological Surgery

## 2021-03-27 ENCOUNTER — Other Ambulatory Visit: Payer: Self-pay

## 2021-03-27 DIAGNOSIS — M5104 Intervertebral disc disorders with myelopathy, thoracic region: Secondary | ICD-10-CM

## 2021-03-27 DIAGNOSIS — M5020 Other cervical disc displacement, unspecified cervical region: Secondary | ICD-10-CM | POA: Diagnosis not present

## 2021-03-27 DIAGNOSIS — M47812 Spondylosis without myelopathy or radiculopathy, cervical region: Secondary | ICD-10-CM | POA: Diagnosis not present

## 2021-03-27 DIAGNOSIS — M5124 Other intervertebral disc displacement, thoracic region: Secondary | ICD-10-CM | POA: Diagnosis not present

## 2021-03-27 DIAGNOSIS — K449 Diaphragmatic hernia without obstruction or gangrene: Secondary | ICD-10-CM | POA: Diagnosis not present

## 2021-03-27 MED ORDER — GADOBENATE DIMEGLUMINE 529 MG/ML IV SOLN
18.0000 mL | Freq: Once | INTRAVENOUS | Status: AC | PRN
  Administered 2021-03-27: 16 mL via INTRAVENOUS

## 2021-03-28 DIAGNOSIS — J301 Allergic rhinitis due to pollen: Secondary | ICD-10-CM | POA: Diagnosis not present

## 2021-03-28 DIAGNOSIS — K648 Other hemorrhoids: Secondary | ICD-10-CM | POA: Diagnosis not present

## 2021-03-29 ENCOUNTER — Other Ambulatory Visit: Payer: Self-pay | Admitting: Internal Medicine

## 2021-03-29 DIAGNOSIS — J301 Allergic rhinitis due to pollen: Secondary | ICD-10-CM | POA: Diagnosis not present

## 2021-04-02 ENCOUNTER — Other Ambulatory Visit: Payer: Self-pay | Admitting: Internal Medicine

## 2021-04-04 DIAGNOSIS — J301 Allergic rhinitis due to pollen: Secondary | ICD-10-CM | POA: Diagnosis not present

## 2021-04-07 DIAGNOSIS — Z6832 Body mass index (BMI) 32.0-32.9, adult: Secondary | ICD-10-CM | POA: Diagnosis not present

## 2021-04-07 DIAGNOSIS — M5104 Intervertebral disc disorders with myelopathy, thoracic region: Secondary | ICD-10-CM | POA: Diagnosis not present

## 2021-04-07 DIAGNOSIS — I10 Essential (primary) hypertension: Secondary | ICD-10-CM | POA: Diagnosis not present

## 2021-04-11 DIAGNOSIS — J301 Allergic rhinitis due to pollen: Secondary | ICD-10-CM | POA: Diagnosis not present

## 2021-04-15 ENCOUNTER — Other Ambulatory Visit: Payer: Self-pay | Admitting: Internal Medicine

## 2021-04-18 DIAGNOSIS — J301 Allergic rhinitis due to pollen: Secondary | ICD-10-CM | POA: Diagnosis not present

## 2021-04-25 DIAGNOSIS — J301 Allergic rhinitis due to pollen: Secondary | ICD-10-CM | POA: Diagnosis not present

## 2021-05-02 DIAGNOSIS — J301 Allergic rhinitis due to pollen: Secondary | ICD-10-CM | POA: Diagnosis not present

## 2021-05-09 DIAGNOSIS — J301 Allergic rhinitis due to pollen: Secondary | ICD-10-CM | POA: Diagnosis not present

## 2021-05-12 ENCOUNTER — Other Ambulatory Visit (INDEPENDENT_AMBULATORY_CARE_PROVIDER_SITE_OTHER): Payer: Medicare HMO

## 2021-05-12 ENCOUNTER — Other Ambulatory Visit: Payer: Self-pay

## 2021-05-12 DIAGNOSIS — I10 Essential (primary) hypertension: Secondary | ICD-10-CM | POA: Diagnosis not present

## 2021-05-12 DIAGNOSIS — E119 Type 2 diabetes mellitus without complications: Secondary | ICD-10-CM

## 2021-05-12 DIAGNOSIS — E7800 Pure hypercholesterolemia, unspecified: Secondary | ICD-10-CM

## 2021-05-12 LAB — LIPID PANEL
Cholesterol: 190 mg/dL (ref 0–200)
HDL: 72.2 mg/dL
LDL Cholesterol: 100 mg/dL — ABNORMAL HIGH (ref 0–99)
NonHDL: 118.09
Total CHOL/HDL Ratio: 3
Triglycerides: 92 mg/dL (ref 0.0–149.0)
VLDL: 18.4 mg/dL (ref 0.0–40.0)

## 2021-05-12 LAB — HEPATIC FUNCTION PANEL
ALT: 10 U/L (ref 0–35)
AST: 16 U/L (ref 0–37)
Albumin: 4.4 g/dL (ref 3.5–5.2)
Alkaline Phosphatase: 81 U/L (ref 39–117)
Bilirubin, Direct: 0.1 mg/dL (ref 0.0–0.3)
Total Bilirubin: 0.7 mg/dL (ref 0.2–1.2)
Total Protein: 6.6 g/dL (ref 6.0–8.3)

## 2021-05-12 LAB — MICROALBUMIN / CREATININE URINE RATIO
Creatinine,U: 108.8 mg/dL
Microalb Creat Ratio: 1 mg/g (ref 0.0–30.0)
Microalb, Ur: 1.1 mg/dL (ref 0.0–1.9)

## 2021-05-12 LAB — BASIC METABOLIC PANEL WITH GFR
BUN: 15 mg/dL (ref 6–23)
CO2: 28 meq/L (ref 19–32)
Calcium: 10.1 mg/dL (ref 8.4–10.5)
Chloride: 104 meq/L (ref 96–112)
Creatinine, Ser: 0.81 mg/dL (ref 0.40–1.20)
GFR: 70.72 mL/min
Glucose, Bld: 107 mg/dL — ABNORMAL HIGH (ref 70–99)
Potassium: 4.2 meq/L (ref 3.5–5.1)
Sodium: 140 meq/L (ref 135–145)

## 2021-05-12 LAB — HEMOGLOBIN A1C: Hgb A1c MFr Bld: 5.9 % (ref 4.6–6.5)

## 2021-05-12 LAB — TSH: TSH: 2.53 u[IU]/mL (ref 0.35–4.50)

## 2021-05-16 ENCOUNTER — Ambulatory Visit (INDEPENDENT_AMBULATORY_CARE_PROVIDER_SITE_OTHER): Payer: Medicare HMO | Admitting: Internal Medicine

## 2021-05-16 ENCOUNTER — Other Ambulatory Visit: Payer: Self-pay

## 2021-05-16 VITALS — BP 132/84 | HR 68 | Temp 97.8°F | Resp 16 | Ht 65.0 in | Wt 196.2 lb

## 2021-05-16 DIAGNOSIS — I10 Essential (primary) hypertension: Secondary | ICD-10-CM

## 2021-05-16 DIAGNOSIS — I700 Atherosclerosis of aorta: Secondary | ICD-10-CM | POA: Diagnosis not present

## 2021-05-16 DIAGNOSIS — F320 Major depressive disorder, single episode, mild: Secondary | ICD-10-CM | POA: Diagnosis not present

## 2021-05-16 DIAGNOSIS — W19XXXA Unspecified fall, initial encounter: Secondary | ICD-10-CM

## 2021-05-16 DIAGNOSIS — K219 Gastro-esophageal reflux disease without esophagitis: Secondary | ICD-10-CM | POA: Diagnosis not present

## 2021-05-16 DIAGNOSIS — J301 Allergic rhinitis due to pollen: Secondary | ICD-10-CM | POA: Diagnosis not present

## 2021-05-16 DIAGNOSIS — E119 Type 2 diabetes mellitus without complications: Secondary | ICD-10-CM

## 2021-05-16 DIAGNOSIS — M4317 Spondylolisthesis, lumbosacral region: Secondary | ICD-10-CM | POA: Diagnosis not present

## 2021-05-16 DIAGNOSIS — F32A Depression, unspecified: Secondary | ICD-10-CM

## 2021-05-16 DIAGNOSIS — E7800 Pure hypercholesterolemia, unspecified: Secondary | ICD-10-CM | POA: Diagnosis not present

## 2021-05-16 DIAGNOSIS — R69 Illness, unspecified: Secondary | ICD-10-CM | POA: Diagnosis not present

## 2021-05-16 NOTE — Progress Notes (Signed)
Patient ID: Erin Camacho, female   DOB: 04/25/1946, 75 y.o.   MRN: [id number]   Subjective:    Patient ID: Erin Camacho, female    DOB: 1946/07/19, 75 y.o.   MRN: [id number]  HPI This visit occurred during the SARS-CoV-2 public health emergency.  Safety protocols were in place, including screening questions prior to the visit, additional usage of staff PPE, and extensive cleaning of exam room while observing appropriate contact time as indicated for disinfecting solutions.   Patient here for a scheduled follow up.  Here to follow up regarding her cholesterol, blood sugar and blood pressure.  Saw Dr Bary Castilla recently.  S/p hemorrhoidal banding.  Tolerated procedure.  S/p colonoscopy 02/2021 - s/p removal 23m polyp - recto-sigmoid region.  Recommended to continue stool bulking agents and/or fiber supplements.  Bowels ok. She reports noticing acid reflux if she eats late.  If she watches what she eats and timing when she eats, no problem with acid reflux.  No abdominal pain.  Is interested in referral to a nutritionist - to help with diet and weight loss (diabetic).  She fell out of a chair - [facility].  Fell on her bottom.  No residual increased pain.  No head injury.    Past Medical History:  Diagnosis Date   Allergy    hay fever   Anxiety    Arthritis    Chronic back pain    Diabetes mellitus    Dyspnea    a. 07/2017 Echo: EF 60-65%. No rwma. Mildly dil LA. Nl RV fxn.   GERD (gastroesophageal reflux disease)    History of hiatal hernia    Hyperlipidemia    Hypertension    Osteoarthritis    Pneumonia    Spinal stenosis    Type 2 diabetes mellitus ([facility]    UTI (lower urinary tract infection)    Past Surgical History:  Procedure Laterality Date   ABDOMINAL HYSTERECTOMY  1973   ABDOMINAL HYSTERECTOMY     APPENDECTOMY     APPLICATION OF ROBOTIC ASSISTANCE FOR SPINAL PROCEDURE N/A 09/15/2018   Procedure: APPLICATION OF ROBOTIC ASSISTANCE FOR SPINAL PROCEDURE;  Surgeon: EKristeen Miss MD;   [facility]  Service: Neurosurgery;  Laterality: N/A;   BACK SURGERY  04/17/13, 2017, 2019   CATARACT EXTRACTION W/PHACO Left 12/16/2019   Procedure: CATARACT EXTRACTION PHACO AND INTRAOCULAR LENS PLACEMENT (IOC) LEFT PANOPTIX LENS DIABETIC 8.07 01:19.2 10.2%;  Surgeon: BLeandrew Koyanagi MD;  Location: [facility]  Service: Ophthalmology;  Laterality: Left;  Diabetic - oral meds   CATARACT EXTRACTION W/PHACO Right 01/06/2020   Procedure: CATARACT EXTRACTION PHACO AND INTRAOCULAR LENS PLACEMENT (IOC) PANOPTIX LENS RIGHT DIABETIC 10.73 01:08.6 15.7%;  Surgeon: BLeandrew Koyanagi MD;  [facility]  Service: Ophthalmology;  Laterality: Right;  diabetic   CHOLECYSTECTOMY  1995   COLONOSCOPY WITH PROPOFOL N/A 03/15/2021   Procedure: COLONOSCOPY WITH PROPOFOL;  Surgeon: BRobert Bellow MD;  Location: [facility];  Service: Endoscopy;  Laterality: N/A;   EYE SURGERY  2021   bilateral cataract removal with lens implants   HERNIA REPAIR  08/2006   lap band  08/2003   LUMBAR LAMINECTOMY/ DECOMPRESSION WITH MET-RX Left 12/29/2020   Procedure: Left Thoracic Seven-Eight Microdiscectomy;  Surgeon: EKristeen Miss MD;  [facility]  Service: Neurosurgery;  Laterality: Left;   SEPTOPLASTY  04/1978   TONSILLECTOMY AND ADENOIDECTOMY  1979   TOTAL HIP ARTHROPLASTY  02/2012   Right, Dr. HMarry Guan  TOTAL HIP ARTHROPLASTY  Left 08/31/2019   Procedure: TOTAL HIP ARTHROPLASTY;  Surgeon: Dereck Leep, MD;  Location: [facility];  Service: Orthopedics;  Laterality: Left;   Family History  Problem Relation Age of Onset   Hypertension Mother    Stroke Mother    Heart disease Mother    Hypertension Father    Stroke Father    Heart disease Father    Arthritis Sister    Diabetes Sister    Cancer Sister    Diabetes Brother    Hypertension Brother    Dementia Brother    Diabetes Brother    Hypertension Brother    Dementia Brother    Breast cancer Other 74   Cancer Other         breast   Diabetes Other    Kidney disease Neg Hx    Bladder Cancer Neg Hx    Social History   Socioeconomic History   Marital status: Married    Spouse name: Not on file   Number of children: Not on file   Years of education: Not on file   Highest education level: Not on file  Occupational History   Not on file  Tobacco Use   Smoking status: Never   Smokeless tobacco: Never  Vaping Use   Vaping Use: Never used  Substance and Sexual Activity   Alcohol use: Yes    Alcohol/week: 0.0 - 1.0 standard drinks    Comment: RARELY   Drug use: No   Sexual activity: Not Currently  Other Topics Concern   Not on file  Social History Narrative   Lives in [place] with husband.      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 Strain: Low Risk    Difficulty of Paying Living Expenses: Not hard at [place]: No Food Insecurity   Worried About Charity fundraiser in [place]: Never true   Arboriculturist in the Last Year: Never true  Transportation Needs: No Data processing manager (Medical): No   Lack of Transportation (Non-Medical): No  Physical Activity: Unknown   Days of Exercise per Week: 0 days   Minutes of Exercise per Session: Not on file  Stress: No Stress Concern Present   Feeling of Stress : Not at all  Social Connections: Unknown   Frequency of Communication with Friends and Family: Not on file   Frequency of Social Gatherings with Friends and Family: Not on file   Attends Religious Services: Not on file   Active Member of Clubs or Organizations: Not on file   Attends Archivist Meetings: Not on file   Marital Status: Married    Review of Systems     Objective:    Physical Exam  BP 132/84   Pulse 68   Temp 97.8 F (36.6 C)   Resp 16   Ht _0  (1.651 m)   Wt 196 lb 3.2 oz (89 kg)   SpO2 99%   BMI 32.65 kg/m  Wt Readings from Last 3 Encounters:  05/16/21  196 lb 3.2 oz (89 kg)  03/08/21 196 lb 6.4 oz (89.1 kg)  01/16/21 190 lb (86.2 kg)    Outpatient Encounter Medications as of 05/16/2021  Medication Sig   acetaminophen (TYLENOL) 650 MG CR tablet Take 1,950 mg by mouth in the morning and at bedtime.   ALPRAZolam (XANAX) 0.25 MG tablet Take 0.25 mg by mouth  daily as needed for anxiety.   baclofen (LIORESAL) 10 MG tablet Take 10 mg by mouth 3 (three) times daily.   Camphor-Menthol-Methyl Sal (SALONPAS) 3.11-24-08 % PTCH Place 1 patch onto the skin daily as needed (pain.).   cholecalciferol (VITAMIN D) 25 MCG (1000 UNIT) tablet Take 1,000 Units by mouth daily.   Ferrous Sulfate (IRON) 325 (65 Fe) MG TABS Take 325 mg by mouth daily.   fexofenadine (ALLEGRA) 180 MG tablet Take 1 tablet (180 mg total) by mouth daily.   fluticasone (FLONASE) 50 MCG/ACT nasal spray SPRAY 2 SPRAYS INTO EACH NOSTRIL EVERY DAY   losartan-hydrochlorothiazide (HYZAAR) 50-12.5 MG tablet TAKE 1 TABLET BY MOUTH EVERY DAY   metFORMIN (GLUCOPHAGE) 500 MG tablet TAKE 1 TABLET BY MOUTH EVERY DAY   Multiple Vitamin (MULTIVITAMIN WITH MINERALS) TABS tablet Take 1 tablet by mouth daily.   OneTouch Delica Lancets 16X MISC Use to check blood sugar x per day. DX E11.9.   ONETOUCH VERIO test strip USE TO CHECK BLOOD SUGAR TWICE A DAY   pantoprazole (PROTONIX) 40 MG tablet TAKE 1 TABLET (40 MG TOTAL) BY MOUTH 2 (TWO) TIMES DAILY BEFORE A MEAL.   sertraline (ZOLOFT) 100 MG tablet TAKE 1/2 TABLET BY MOUTH (50 MG) BY MOUTH DAILY   simvastatin (ZOCOR) 10 MG tablet TAKE 1/2 TABLET (5 MG TOTAL) BY MOUTH AT BEDTIME.   traMADol (ULTRAM) 50 MG tablet Take 50 mg by mouth every 12 (twelve) hours as needed.   traZODone (DESYREL) 100 MG tablet TAKE 1 TABLET BY MOUTH EVERYDAY AT BEDTIME   [DISCONTINUED] amoxicillin (AMOXIL) 500 MG capsule Take 2,000 mg by mouth See admin instructions. Take 4 capsules (2000 mg) by mouth 1 hour prior to dental appointments.   [DISCONTINUED] Calcium Carb-Cholecalciferol  (CALCIUM 600+D3 PO) Take 1 tablet by mouth daily.   [DISCONTINUED] Liniments (SALONPAS ARTHRITIS PAIN RELIEF EX) Apply 1 application topically 3 (three) times daily as needed (pain.).   [DISCONTINUED] magnesium oxide (MAG-OX) 400 MG tablet TAKE 1 TABLET BY MOUTH EVERY DAY   [DISCONTINUED] metoprolol succinate (TOPROL-XL) 25 MG 24 hr tablet TAKE 1/2 TABLET BY MOUTH EVERY DAY   [DISCONTINUED] Polyethyl Glycol-Propyl Glycol (LUBRICANT EYE DROPS) 0.4-0.3 % SOLN Place 1 drop into both eyes 3 (three) times daily as needed (dry/irritated eyes.).   [DISCONTINUED] tiZANidine (ZANAFLEX) 2 MG tablet TAKE 1 TABLET (2 MG TOTAL) BY MOUTH 2 (TWO) TIMES DAILY AS NEEDED FOR MUSCLE SPASMS.   No facility-administered encounter medications on file as of 05/16/2021.     Lab Results  Component Value Date   WBC 7.4 01/02/2021   HGB 13.4 01/02/2021   HCT 42.9 01/02/2021   PLT 182 01/02/2021   GLUCOSE 107 (H) 05/12/2021   CHOL 190 05/12/2021   TRIG 92.0 05/12/2021   HDL 72.20 05/12/2021   LDLDIRECT 146.5 09/19/2012   LDLCALC 100 (H) 05/12/2021   ALT 10 05/12/2021   AST 16 05/12/2021   NA 140 05/12/2021   K 4.2 05/12/2021   CL 104 05/12/2021   CREATININE 0.81 05/12/2021   BUN 15 05/12/2021   CO2 28 05/12/2021   TSH 2.53 05/12/2021   INR 1.0 08/21/2019   HGBA1C 5.9 05/12/2021   MICROALBUR 1.1 05/12/2021    MR THORACIC SPINE W WO CONTRAST  Result Date: 03/28/2021 CLINICAL DATA:  75 year old female with right side back pain and muscle spasms. Prior surgery. EXAM: MRI THORACIC WITHOUT AND WITH CONTRAST TECHNIQUE: Multiplanar and multiecho pulse sequences of the thoracic spine were obtained without and with intravenous contrast.  CONTRAST:  46m MULTIHANCE GADOBENATE DIMEGLUMINE 529 MG/ML IV SOLN COMPARISON:  Intraoperative images 12/29/2020. Thoracic spine MRI 10/16/2020. FINDINGS: Limited cervical spine imaging: Stable from last year. Multilevel mild disc bulging and probable chronic vacuum disc at C5-C6.  Chronic left side facet degeneration at C3-C4. Thoracic spine segmentation: Appears to be normal. Same numbering system used in November. Alignment:  Stable thoracic kyphosis since last year. Vertebrae: Chronic hardware susceptibility artifact from posterior spinal fusion from the T10 level through the visible upper lumbar spine. Chronic T9 and T10 vertebral body marrow edema has not significantly changed since November. No new osseous abnormality identified. Normal background bone marrow signal. Cord: Chronic appearing small syrinx or prominence of the central spinal canal on axial images most pronounced at T9-T10 (series 6, image 25). No convincing cord myelomalacia. Lower thoracic cord and conus detail degraded by hardware susceptibility artifact. No abnormal intradural enhancement identified. No definite dural thickening. Paraspinal and other soft tissues: Interval postoperative changes at T7-T8 on the left with enhancing granulation tissue (series 10, image 11). Otherwise stable. Chronic gastric hiatal hernia, small to moderate. Otherwise negative visible chest and upper abdominal viscera. Disc levels: T1-T2: Negative. T2-T3: Negative. T3-T4: Negative. T4-T5: Negative. T5-T6: Right paracentral to lateral recess disc protrusion is stable best seen on series 7, image 15. Stable borderline to mild associated spinal stenosis and right hemi cord mass effect. T6-T7: Broad-based and mildly lobulated central disc protrusion is stable along with borderline to mild spinal stenosis. (Series 7, image 17). T7-T8: Interval left laminectomy. Regressed although not completely resolved disc extrusion which is now in the midline above the disc space (series 7, image 19) and eccentric to the right at the disc space (series 6, image 20). Still, spinal stenosis and spinal cord mass effect are significantly regressed (compare to series 8, image 23 in November). Mild new facet hypertrophy on the right. No significant foraminal  stenosis. T8-T9: Stable left paracentral disc extrusion with mild spinal stenosis and mild ventral cord mass effect (series 6, image 23). Mild to moderate facet and ligament flavum hypertrophy is greater on the right. Stable mild right T8 foraminal stenosis. T9-T10: Chronic disc heterogeneity with endplate spurring and mild to moderate facet hypertrophy. This level is stable with no significant stenosis. T10-T11: Prior posterior fusion.  No stenosis. T11-T12: Prior posterior fusion.  No stenosis. T12-L1: Prior posterior fusion.  No stenosis. IMPRESSION: 1. Interval surgery on the left at T7-T8 with largely resolved spinal stenosis and spinal cord mass effect there. Mildly lobulated residual small disc extrusion. 2. Otherwise stable thoracic spine since November. Previous thoracolumbar fusion beginning at T10. 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 Chronic disc herniations at T5-T6, T6-T7 and T8-T9 with up to mild spinal stenosis and spinal cord mass effect. Stable thoracic spinal cord signal, with small syrinx or prominent central canal most pronounced at T9-T10. 3. Cervical spine degeneration.  Chronic gastric hiatal hernia. Electronically Signed   By: HGenevie AnnM.D.   On: 03/28/2021 05:32       Assessment & Plan:   Problem List Items Addressed This Visit     Aortic atherosclerosis ([facility]    Continue simvastatin.  Continue blood pressure control.        Diabetes mellitus type 2, diet-controlled (HCC)    Low carb diet and exercise.  Follow met b and a1c.   Lab Results  Component Value Date   HGBA1C 5.9 05/12/2021        Relevant Orders  Hemoglobin A1c   [place] out of a [employer] Day.  No head injury.  No residual problems.  Follow.        GERD (gastroesophageal reflux disease)    Protonix.  Controlled if watches what she eats and does not eat late.  Follow.        Hypercholesterolemia    Continue simvastatin.  Low cholesterol  diet and exercise.  Follow lipid panel and liver function tests.         Relevant Orders   Hepatic function panel   Lipid panel   Hypertension - Primary    Blood pressure doing well.  Per pt - off metoprolol.  Continue losartan/hctz.  Follow pressures.  Follow metabolic panel.         Relevant Orders   Basic metabolic panel   Mild depression ([place])    Continue on zoloft.  Stable.  Follow.         Spondylolisthesis at L5-S1 level (Chronic)    Has seen Dr Ellene Route.          [name], MD

## 2021-05-22 ENCOUNTER — Encounter: Payer: Self-pay | Admitting: Internal Medicine

## 2021-05-22 DIAGNOSIS — W19XXXA Unspecified fall, initial encounter: Secondary | ICD-10-CM | POA: Insufficient documentation

## 2021-05-22 NOTE — Assessment & Plan Note (Signed)
[place] out of a [employer] Day.  No head injury.  No residual problems.  Follow.

## 2021-05-22 NOTE — Assessment & Plan Note (Signed)
Low carb diet and exercise.  Follow met b and a1c.   Lab Results  Component Value Date   HGBA1C 5.9 05/12/2021

## 2021-05-22 NOTE — Assessment & Plan Note (Signed)
Protonix.  Controlled if watches what she eats and does not eat late.  Follow.

## 2021-05-22 NOTE — Assessment & Plan Note (Signed)
Has seen Dr Ellene Route.

## 2021-05-22 NOTE — Assessment & Plan Note (Signed)
Blood pressure doing well.  Per pt - off metoprolol.  Continue losartan/hctz.  Follow pressures.  Follow metabolic panel.

## 2021-05-22 NOTE — Assessment & Plan Note (Addendum)
Continue simvastatin.  Continue blood pressure control.

## 2021-05-22 NOTE — Assessment & Plan Note (Signed)
Continue simvastatin.  Low cholesterol diet and exercise.  Follow lipid panel and liver function tests.   

## 2021-05-22 NOTE — Assessment & Plan Note (Signed)
Continue on zoloft.  Stable.  Follow.   

## 2021-05-23 DIAGNOSIS — J301 Allergic rhinitis due to pollen: Secondary | ICD-10-CM | POA: Diagnosis not present

## 2021-05-30 DIAGNOSIS — J301 Allergic rhinitis due to pollen: Secondary | ICD-10-CM | POA: Diagnosis not present

## 2021-06-06 DIAGNOSIS — J301 Allergic rhinitis due to pollen: Secondary | ICD-10-CM | POA: Diagnosis not present

## 2021-06-13 DIAGNOSIS — J301 Allergic rhinitis due to pollen: Secondary | ICD-10-CM | POA: Diagnosis not present

## 2021-06-14 DIAGNOSIS — J301 Allergic rhinitis due to pollen: Secondary | ICD-10-CM | POA: Diagnosis not present

## 2021-06-15 DIAGNOSIS — J0181 Other acute recurrent sinusitis: Secondary | ICD-10-CM | POA: Diagnosis not present

## 2021-06-15 DIAGNOSIS — J301 Allergic rhinitis due to pollen: Secondary | ICD-10-CM | POA: Diagnosis not present

## 2021-06-27 DIAGNOSIS — J301 Allergic rhinitis due to pollen: Secondary | ICD-10-CM | POA: Diagnosis not present

## 2021-06-30 ENCOUNTER — Encounter: Payer: Self-pay | Admitting: Internal Medicine

## 2021-06-30 DIAGNOSIS — E119 Type 2 diabetes mellitus without complications: Secondary | ICD-10-CM

## 2021-07-01 NOTE — Telephone Encounter (Signed)
Order placed for referral to [facility]. Someone should be contacting her with appt date and time.

## 2021-07-04 DIAGNOSIS — J301 Allergic rhinitis due to pollen: Secondary | ICD-10-CM | POA: Diagnosis not present

## 2021-07-06 ENCOUNTER — Other Ambulatory Visit: Payer: Self-pay

## 2021-07-06 ENCOUNTER — Encounter: Payer: Medicare HMO | Attending: Internal Medicine | Admitting: *Deleted

## 2021-07-06 ENCOUNTER — Encounter: Payer: Self-pay | Admitting: *Deleted

## 2021-07-06 VITALS — Ht 65.0 in | Wt 199.7 lb

## 2021-07-06 DIAGNOSIS — E119 Type 2 diabetes mellitus without complications: Secondary | ICD-10-CM | POA: Diagnosis present

## 2021-07-06 NOTE — Progress Notes (Signed)
Diabetes Self-Management Education  Visit Type: First/Initial  Appt. Start Time: 1355 Appt. End Time: [id number]  07/06/2021  [name], identified by name and date of birth, is a 75 y.o. female with a diagnosis of Diabetes: Type 2.   ASSESSMENT  Blood pressure (P) 122/78, height '5\' 5"'$  (1.651 m), weight 199 lb 11.2 oz (90.6 kg). Body mass index is 33.23 kg/m.   Diabetes Self-Management Education - 07/06/21 1500       Visit Information   Visit Type First/Initial      Initial Visit   Diabetes Type Type 2    Are you currently following a meal plan? No   "got on a sweet binge"   Are you taking your medications as prescribed? Yes    Date Diagnosed 3 years ago      Health Coping   How would you rate your overall health? Good      Psychosocial Assessment   Patient Belief/Attitude about Diabetes Motivated to manage diabetes    Self-care barriers None    Self-management support Doctor's office;Family    Patient Concerns Weight Control;Healthy Lifestyle    Special Needs None    Preferred Learning Style Auditory;Hands on;Visual    Learning Readiness Ready    How often do you need to have someone help you when you read instructions, pamphlets, or other written materials from your doctor or pharmacy? 1 - Never    What is the last grade level you completed in school? 12      Pre-Education Assessment   Patient understands the diabetes disease and treatment process. Needs Review    Patient understands incorporating nutritional management into lifestyle. Needs Review    Patient undertands incorporating physical activity into lifestyle. Needs Instruction    Patient understands using medications safely. Needs Review    Patient understands monitoring blood glucose, interpreting and using results Needs Review    Patient understands prevention, detection, and treatment of acute complications. Needs Review    Patient understands prevention, detection, and treatment of chronic complications.  Needs Review    Patient understands how to develop strategies to address psychosocial issues. Needs Review    Patient understands how to develop strategies to promote health/change behavior. Needs Review      Complications   Last HgB A1C per patient/outside source 5.9 %   05/12/2021   How often do you check your blood sugar? 1-2 times/day    Fasting Blood glucose range (mg/dL) 70-129   FBG's 105-128 mg/dL   Postprandial Blood glucose range (mg/dL) 70-129;130-179   pp's 102-152 mg/dL   Have you had a dilated eye exam in the past 12 months? Yes    Have you had a dental exam in the past 12 months? Yes    Are you checking your feet? Yes    How many days per week are you checking your feet? 7      Dietary Intake   Breakfast sometimes skips - breakfast bar; Belvita blueberry biscuit - 1 from pack of 4; cheese toast    Snack (morning) reports rare snack which is candy    Lunch eats breakfast foods or lunch - sausage, eggs, tomaotes, bread; ham or pimento cheese or bologna sandwich; cheese toast; vegetable soup; banana sandwich    Dinner chicken, beef; peas, beans - she limits potatoes, corn, rice, pasta; eats green beans, tomatoes, squash zucchini, cuccumbers, cabbage    Beverage(s) water, artificial sweeteners like Crystal light      Exercise   Exercise Type  ADL's      Patient Education   Previous Diabetes Education Yes (please comment)   she came to diabetes classes in 2020   Disease state  Explored patient's options for treatment of their diabetes    Nutrition management  Role of diet in the treatment of diabetes and the relationship between the three main macronutrients and blood glucose level;Food label reading, portion sizes and measuring food.;Carbohydrate counting;Reviewed blood glucose goals for pre and post meals and how to evaluate the patients' food intake on their blood glucose level.;Meal options for control of blood glucose level and chronic complications.    Physical activity and  exercise  Role of exercise on diabetes management, blood pressure control and cardiac health.   Limited exercise due to back pain and past surgeries   Medications Reviewed patients medication for diabetes, action, purpose, timing of dose and side effects.    Monitoring Purpose and frequency of SMBG.;Taught/discussed recording of test results and interpretation of SMBG.;Identified appropriate SMBG and/or A1C goals.    Chronic complications Relationship between chronic complications and blood glucose control    Psychosocial adjustment Identified and addressed patients feelings and concerns about diabetes      Individualized Goals (developed by patient)   Reducing Risk Other (comment)   lose weight, lead a healthier lifestyle     Outcomes   Expected Outcomes Demonstrated interest in learning. Expect positive outcomes    Future DMSE 4-6 wks             Individualized Plan for Diabetes Self-Management Training:   Learning Objective:  Patient will have a greater understanding of diabetes self-management. Patient education plan is to attend individual and/or group sessions per assessed needs and concerns.   Plan:   Patient Instructions  Check blood sugars 2 x day before breakfast and 2 hrs after supper - 3 x week Bring blood sugar records to the next appointment  Exercise:   Walk as tolerated  Eat 3 meals day,   1  snack a day Space meals 4-6 hours apart Don't skip meals - eat at least 1 protein and 1 carbohydrate serving Continue to limit desserts/sweets Complete 3 Day Food Record and bring to next appt  Return for appointment on:  Thursday August 17, 2021 at 1:30 pm with Pam (dietitian)   Expected Outcomes:  Demonstrated interest in learning. Expect positive outcomes  Education material provided:  General Meal Planning Guidelines Simple Meal Plan 3 Day Food Record Smart Snacking Quick and Balanced Meals  If problems or questions, patient to contact team via:   Johny Drilling, RN, [place] [phone]   Future DSME appointment: 4-6 wks August 17, 2021 with the dietitian

## 2021-07-06 NOTE — Patient Instructions (Signed)
Check blood sugars 2 x day before breakfast and 2 hrs after supper - 3 x week Bring blood sugar records to the next appointment  Exercise:   Walk as tolerated  Eat 3 meals day,   1  snack a day Space meals 4-6 hours apart Don't skip meals - eat at least 1 protein and 1 carbohydrate serving Continue to limit desserts/sweets Complete 3 Day Food Record and bring to next appt  Return for appointment on:  Thursday August 17, 2021 at 1:30 pm with Pam (dietitian)

## 2021-07-11 DIAGNOSIS — J301 Allergic rhinitis due to pollen: Secondary | ICD-10-CM | POA: Diagnosis not present

## 2021-07-14 DIAGNOSIS — M65332 Trigger finger, left middle finger: Secondary | ICD-10-CM | POA: Diagnosis not present

## 2021-07-18 DIAGNOSIS — J301 Allergic rhinitis due to pollen: Secondary | ICD-10-CM | POA: Diagnosis not present

## 2021-07-25 DIAGNOSIS — J301 Allergic rhinitis due to pollen: Secondary | ICD-10-CM | POA: Diagnosis not present

## 2021-07-27 DIAGNOSIS — Z872 Personal history of diseases of the skin and subcutaneous tissue: Secondary | ICD-10-CM | POA: Diagnosis not present

## 2021-07-27 DIAGNOSIS — L570 Actinic keratosis: Secondary | ICD-10-CM | POA: Diagnosis not present

## 2021-07-27 DIAGNOSIS — L720 Epidermal cyst: Secondary | ICD-10-CM | POA: Diagnosis not present

## 2021-07-27 DIAGNOSIS — L578 Other skin changes due to chronic exposure to nonionizing radiation: Secondary | ICD-10-CM | POA: Diagnosis not present

## 2021-08-01 DIAGNOSIS — J301 Allergic rhinitis due to pollen: Secondary | ICD-10-CM | POA: Diagnosis not present

## 2021-08-07 DIAGNOSIS — M65332 Trigger finger, left middle finger: Secondary | ICD-10-CM | POA: Diagnosis not present

## 2021-08-08 DIAGNOSIS — J301 Allergic rhinitis due to pollen: Secondary | ICD-10-CM | POA: Diagnosis not present

## 2021-08-14 ENCOUNTER — Encounter: Payer: Self-pay | Admitting: *Deleted

## 2021-08-14 ENCOUNTER — Telehealth: Payer: Self-pay | Admitting: *Deleted

## 2021-08-14 NOTE — Telephone Encounter (Signed)
Received voice mail from patient that she would need to cancel her appointment with the dietitian on Thursday. Her husband is having health issues and they have multiple MD appointments at this time. Instructed her to call back if she has any questions and that her referral is valid for a year if she changes her mind.

## 2021-08-15 DIAGNOSIS — J301 Allergic rhinitis due to pollen: Secondary | ICD-10-CM | POA: Diagnosis not present

## 2021-08-17 ENCOUNTER — Ambulatory Visit: Payer: Medicare HMO | Admitting: Dietician

## 2021-08-20 ENCOUNTER — Other Ambulatory Visit: Payer: Self-pay | Admitting: Internal Medicine

## 2021-08-22 DIAGNOSIS — J301 Allergic rhinitis due to pollen: Secondary | ICD-10-CM | POA: Diagnosis not present

## 2021-08-29 ENCOUNTER — Other Ambulatory Visit: Payer: Self-pay | Admitting: Internal Medicine

## 2021-08-29 DIAGNOSIS — J301 Allergic rhinitis due to pollen: Secondary | ICD-10-CM | POA: Diagnosis not present

## 2021-09-05 DIAGNOSIS — J301 Allergic rhinitis due to pollen: Secondary | ICD-10-CM | POA: Diagnosis not present

## 2021-09-06 DIAGNOSIS — J301 Allergic rhinitis due to pollen: Secondary | ICD-10-CM | POA: Diagnosis not present

## 2021-09-12 DIAGNOSIS — J301 Allergic rhinitis due to pollen: Secondary | ICD-10-CM | POA: Diagnosis not present

## 2021-09-13 ENCOUNTER — Other Ambulatory Visit: Payer: Medicare HMO

## 2021-09-14 DIAGNOSIS — Z96642 Presence of left artificial hip joint: Secondary | ICD-10-CM | POA: Diagnosis not present

## 2021-09-15 ENCOUNTER — Ambulatory Visit: Payer: Medicare HMO | Admitting: Internal Medicine

## 2021-09-19 DIAGNOSIS — J301 Allergic rhinitis due to pollen: Secondary | ICD-10-CM | POA: Diagnosis not present

## 2021-09-20 ENCOUNTER — Other Ambulatory Visit: Payer: Self-pay | Admitting: Internal Medicine

## 2021-09-26 DIAGNOSIS — J301 Allergic rhinitis due to pollen: Secondary | ICD-10-CM | POA: Diagnosis not present

## 2021-09-28 ENCOUNTER — Other Ambulatory Visit: Payer: Self-pay | Admitting: Internal Medicine

## 2021-09-28 DIAGNOSIS — Z1231 Encounter for screening mammogram for malignant neoplasm of breast: Secondary | ICD-10-CM

## 2021-10-03 DIAGNOSIS — J301 Allergic rhinitis due to pollen: Secondary | ICD-10-CM | POA: Diagnosis not present

## 2021-10-07 ENCOUNTER — Other Ambulatory Visit: Payer: Self-pay | Admitting: Internal Medicine

## 2021-10-10 DIAGNOSIS — J301 Allergic rhinitis due to pollen: Secondary | ICD-10-CM | POA: Diagnosis not present

## 2021-10-24 DIAGNOSIS — J301 Allergic rhinitis due to pollen: Secondary | ICD-10-CM | POA: Diagnosis not present

## 2021-10-27 ENCOUNTER — Other Ambulatory Visit (INDEPENDENT_AMBULATORY_CARE_PROVIDER_SITE_OTHER): Payer: Medicare HMO

## 2021-10-27 ENCOUNTER — Other Ambulatory Visit: Payer: Self-pay

## 2021-10-27 DIAGNOSIS — E119 Type 2 diabetes mellitus without complications: Secondary | ICD-10-CM

## 2021-10-27 DIAGNOSIS — I10 Essential (primary) hypertension: Secondary | ICD-10-CM | POA: Diagnosis not present

## 2021-10-27 DIAGNOSIS — E7800 Pure hypercholesterolemia, unspecified: Secondary | ICD-10-CM

## 2021-10-27 LAB — LIPID PANEL
Cholesterol: 154 mg/dL (ref 0–200)
HDL: 66.5 mg/dL
LDL Cholesterol: 72 mg/dL (ref 0–99)
NonHDL: 87.12
Total CHOL/HDL Ratio: 2
Triglycerides: 76 mg/dL (ref 0.0–149.0)
VLDL: 15.2 mg/dL (ref 0.0–40.0)

## 2021-10-27 LAB — BASIC METABOLIC PANEL WITH GFR
BUN: 21 mg/dL (ref 6–23)
CO2: 29 meq/L (ref 19–32)
Calcium: 9.8 mg/dL (ref 8.4–10.5)
Chloride: 105 meq/L (ref 96–112)
Creatinine, Ser: 0.79 mg/dL (ref 0.40–1.20)
GFR: 72.63 mL/min
Glucose, Bld: 103 mg/dL — ABNORMAL HIGH (ref 70–99)
Potassium: 4 meq/L (ref 3.5–5.1)
Sodium: 143 meq/L (ref 135–145)

## 2021-10-27 LAB — HEPATIC FUNCTION PANEL
ALT: 10 U/L (ref 0–35)
AST: 17 U/L (ref 0–37)
Albumin: 4.2 g/dL (ref 3.5–5.2)
Alkaline Phosphatase: 80 U/L (ref 39–117)
Bilirubin, Direct: 0.2 mg/dL (ref 0.0–0.3)
Total Bilirubin: 0.7 mg/dL (ref 0.2–1.2)
Total Protein: 6.6 g/dL (ref 6.0–8.3)

## 2021-10-27 LAB — HEMOGLOBIN A1C: Hgb A1c MFr Bld: 5.9 % (ref 4.6–6.5)

## 2021-10-30 ENCOUNTER — Other Ambulatory Visit: Payer: Self-pay

## 2021-10-30 ENCOUNTER — Ambulatory Visit (INDEPENDENT_AMBULATORY_CARE_PROVIDER_SITE_OTHER): Payer: Medicare HMO | Admitting: Internal Medicine

## 2021-10-30 DIAGNOSIS — E669 Obesity, unspecified: Secondary | ICD-10-CM | POA: Diagnosis not present

## 2021-10-30 DIAGNOSIS — K219 Gastro-esophageal reflux disease without esophagitis: Secondary | ICD-10-CM

## 2021-10-30 DIAGNOSIS — E7800 Pure hypercholesterolemia, unspecified: Secondary | ICD-10-CM | POA: Diagnosis not present

## 2021-10-30 DIAGNOSIS — I10 Essential (primary) hypertension: Secondary | ICD-10-CM

## 2021-10-30 DIAGNOSIS — J329 Chronic sinusitis, unspecified: Secondary | ICD-10-CM

## 2021-10-30 DIAGNOSIS — R69 Illness, unspecified: Secondary | ICD-10-CM | POA: Diagnosis not present

## 2021-10-30 DIAGNOSIS — I700 Atherosclerosis of aorta: Secondary | ICD-10-CM | POA: Diagnosis not present

## 2021-10-30 DIAGNOSIS — F32A Depression, unspecified: Secondary | ICD-10-CM

## 2021-10-30 DIAGNOSIS — E119 Type 2 diabetes mellitus without complications: Secondary | ICD-10-CM

## 2021-10-30 MED ORDER — ALPRAZOLAM 0.25 MG PO TABS: 0.2500 mg | ORAL_TABLET | Freq: Every day | ORAL | 0 refills | Status: DC | PRN

## 2021-10-30 MED ORDER — AMOXICILLIN-POT CLAVULANATE 875-125 MG PO TABS: 1.0000 | ORAL_TABLET | Freq: Two times a day (BID) | ORAL | 0 refills | Status: DC

## 2021-10-30 NOTE — Patient Instructions (Signed)
Saline nasal spray - flush nose at least 2x/day  Flonase nasal spray - 2 sprays each nostril one time per day.  Do this in the evening.    Mucinex.    Continue probiotic.

## 2021-10-30 NOTE — Progress Notes (Signed)
Patient ID: Erin Camacho, female   DOB: 1946/10/03, 75 y.o.   MRN: [id number]   Subjective:    Patient ID: Erin Camacho, female    DOB: [birth year], 75 y.o.   MRN: [id number]  This visit occurred during the SARS-CoV-2 public health emergency.  Safety protocols were in place, including screening questions prior to the visit, additional usage of staff PPE, and extensive cleaning of exam room while observing appropriate contact time as indicated for disinfecting solutions.   Patient here for a scheduled follow up.     HPI Here to follow up regarding her diabetes, hypertension and hypercholesterolemia.  She is s/p hip surgery two years ago.  Had f/u with Dr Marry Guan 09/14/21.  Had a fall two weeks prior to that appt. Xrays ok.  Stable.  Recommended f/u in one year.  S/p trigger finger releast 08/21/21 - still with some issues.  Desires no further intervention.  No chest pain.  Breathing stable.  No increased cough or congestion reported.  Some increased sinus pressure - right.  Increased nasal congestion.  Persistent sinus pressure.  Feels like sinus infection.  Concerned regarding her weight.  Is eating more.  Discussed diet and exercise.  Discussed balance.  Discussed PT.  Will notify me if desires.     Past Medical History:  Diagnosis Date   Allergy    hay fever   Anxiety    Arthritis    Chronic back pain    Diabetes mellitus    Dyspnea    a. 07/2017 Echo: EF 60-65%. No rwma. Mildly dil LA. Nl RV fxn.   GERD (gastroesophageal reflux disease)    History of hiatal hernia    Hyperlipidemia    Hypertension    Osteoarthritis    Pneumonia    Spinal stenosis    Type 2 diabetes mellitus ([place])    UTI (lower urinary tract infection)    Past Surgical History:  Procedure Laterality Date   ABDOMINAL HYSTERECTOMY  1973   ABDOMINAL HYSTERECTOMY     APPENDECTOMY     APPLICATION OF ROBOTIC ASSISTANCE FOR SPINAL PROCEDURE N/A 09/15/2018   Procedure: APPLICATION OF ROBOTIC ASSISTANCE FOR SPINAL  PROCEDURE;  Surgeon: Kristeen Miss, MD;  Location: [facility];  Service: Neurosurgery;  Laterality: N/A;   BACK SURGERY  04/17/13, 2017, 2019   CATARACT EXTRACTION W/PHACO Left 12/16/2019   Procedure: CATARACT EXTRACTION PHACO AND INTRAOCULAR LENS PLACEMENT (IOC) LEFT PANOPTIX LENS DIABETIC 8.07 01:19.2 10.2%;  Surgeon: Leandrew Koyanagi, MD;  Location: [facility];  Service: Ophthalmology;  Laterality: Left;  Diabetic - oral meds   CATARACT EXTRACTION W/PHACO Right 01/06/2020   Procedure: CATARACT EXTRACTION PHACO AND INTRAOCULAR LENS PLACEMENT (IOC) PANOPTIX LENS RIGHT DIABETIC 10.73 01:08.6 15.7%;  Surgeon: Leandrew Koyanagi, MD;  Location: [facility];  Service: Ophthalmology;  Laterality: Right;  diabetic   CHOLECYSTECTOMY  1995   COLONOSCOPY WITH PROPOFOL N/A 03/15/2021   Procedure: COLONOSCOPY WITH PROPOFOL;  Surgeon: Robert Bellow, MD;  Location: [facility];  Service: Endoscopy;  Laterality: N/A;   EYE SURGERY  2021   bilateral cataract removal with lens implants   HERNIA REPAIR  08/2006   lap band  08/2003   LUMBAR LAMINECTOMY/ DECOMPRESSION WITH MET-RX Left 12/29/2020   Procedure: Left Thoracic Seven-Eight Microdiscectomy;  Surgeon: Kristeen Miss, MD;  Location: [facility];  Service: Neurosurgery;  Laterality: Left;   SEPTOPLASTY  04/1978   TONSILLECTOMY AND ADENOIDECTOMY  1979   TOTAL HIP ARTHROPLASTY  02/2012   Right, Dr.  Hooten   TOTAL HIP ARTHROPLASTY Left 08/31/2019   Procedure: TOTAL HIP ARTHROPLASTY;  Surgeon: Dereck Leep, MD;  Location: [facility];  Service: Orthopedics;  Laterality: Left;   Family History  Problem Relation Age of Onset   Hypertension Mother    Stroke Mother    Heart disease Mother    Hypertension Father    Stroke Father    Heart disease Father    Arthritis Sister    Diabetes Sister    Cancer Sister    Diabetes Brother    Hypertension Brother    Dementia Brother    Diabetes Brother    Hypertension Brother    Dementia Brother     Breast cancer Other 40   Cancer Other        breast   Diabetes Other    Kidney disease Neg Hx    Bladder Cancer Neg Hx    Social History   Socioeconomic History   Marital status: Married    Spouse name: Not on file   Number of children: Not on file   Years of education: Not on file   Highest education level: Not on file  Occupational History   Not on file  Tobacco Use   Smoking status: Never   Smokeless tobacco: Never  Vaping Use   Vaping Use: Never used  Substance and Sexual Activity   Alcohol use: Yes    Alcohol/week: 0.0 - 1.0 standard drinks    Comment: RARELY   Drug use: No   Sexual activity: Not Currently  Other Topics Concern   Not on file  Social History Narrative   Lives in [place] with husband.      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 Strain: Low Risk    Difficulty of Paying Living Expenses: Not hard at [place]: No Food Insecurity   Worried About Charity fundraiser in [place]: Never true   Arboriculturist in the Last Year: Never true  Transportation Needs: No Data processing manager (Medical): No   Lack of Transportation (Non-Medical): No  Physical Activity: Unknown   Days of Exercise per Week: 0 days   Minutes of Exercise per Session: Not on file  Stress: No Stress Concern Present   Feeling of Stress : Not at all  Social Connections: Unknown   Frequency of Communication with Friends and Family: Not on file   Frequency of Social Gatherings with Friends and Family: Not on file   Attends Religious Services: Not on file   Active Member of Clubs or Organizations: Not on file   Attends Archivist Meetings: Not on file   Marital Status: Married     Review of Systems  Constitutional:  Negative for appetite change and unexpected weight change.  HENT:  Positive for congestion and sinus pressure.   Respiratory:  Negative for cough, chest tightness  and shortness of breath.   Cardiovascular:  Negative for chest pain, palpitations and leg swelling.  Gastrointestinal:  Negative for abdominal pain, diarrhea, nausea and vomiting.  Genitourinary:  Negative for difficulty urinating and dysuria.  Musculoskeletal:  Negative for joint swelling and myalgias.  Skin:  Negative for color change and rash.  Neurological:  Negative for dizziness, light-headedness and headaches.  Psychiatric/Behavioral:  Negative for agitation and dysphoric mood.       Objective:     BP 126/70   Pulse 69  Temp 97.6 F (36.4 C)   Resp 16   Ht $R'5\' 5"'DF$  (1.651 m)   Wt 199 lb (90.3 kg)   SpO2 98%   BMI 33.12 kg/m  Wt Readings from Last 3 Encounters:  10/30/21 199 lb (90.3 kg)  07/06/21 199 lb 11.2 oz (90.6 kg)  05/16/21 196 lb 3.2 oz (89 kg)    Physical Exam Vitals reviewed.  Constitutional:      General: She is not in acute distress.    Appearance: Normal appearance.  HENT:     Head: Normocephalic and atraumatic.     Right Ear: External ear normal.     Left Ear: External ear normal.  Eyes:     General: No scleral icterus.       Right eye: No discharge.        Left eye: No discharge.     Conjunctiva/sclera: Conjunctivae normal.  Neck:     Thyroid: No thyromegaly.  Cardiovascular:     Rate and Rhythm: Normal rate and regular rhythm.  Pulmonary:     Effort: No respiratory distress.     Breath sounds: Normal breath sounds. No wheezing.  Abdominal:     General: Bowel sounds are normal.     Palpations: Abdomen is soft.     Tenderness: There is no abdominal tenderness.  Musculoskeletal:        General: No swelling or tenderness.     Cervical back: Neck supple. No tenderness.  Lymphadenopathy:     Cervical: No cervical adenopathy.  Skin:    Findings: No erythema or rash.  Neurological:     Mental Status: She is alert.  Psychiatric:        Mood and Affect: Mood normal.        Behavior: Behavior normal.     Outpatient Encounter Medications  as of 10/30/2021  Medication Sig   amoxicillin-clavulanate (AUGMENTIN) 875-125 MG tablet Take 1 tablet by mouth 2 (two) times daily.   acetaminophen (TYLENOL) 650 MG CR tablet Take 1,950 mg by mouth in the morning and at bedtime.   ALPRAZolam (XANAX) 0.25 MG tablet Take 1 tablet (0.25 mg total) by mouth daily as needed for anxiety.   baclofen (LIORESAL) 10 MG tablet Take 10 mg by mouth 3 (three) times daily.   Camphor-Menthol-Methyl Sal (SALONPAS) 3.11-24-08 % PTCH Place 1 patch onto the skin daily as needed (pain.).   cholecalciferol (VITAMIN D) 25 MCG (1000 UNIT) tablet Take 1,000 Units by mouth daily.   Ferrous Sulfate (IRON) 325 (65 Fe) MG TABS Take 325 mg by mouth daily.   fexofenadine (ALLEGRA) 180 MG tablet Take 1 tablet (180 mg total) by mouth daily.   fluticasone (FLONASE) 50 MCG/ACT nasal spray SPRAY 2 SPRAYS INTO EACH NOSTRIL EVERY DAY   losartan-hydrochlorothiazide (HYZAAR) 50-12.5 MG tablet TAKE 1 TABLET BY MOUTH EVERY DAY   metFORMIN (GLUCOPHAGE) 500 MG tablet TAKE 1 TABLET BY MOUTH EVERY DAY   Multiple Vitamin (MULTIVITAMIN WITH MINERALS) TABS tablet Take 1 tablet by mouth daily.   OneTouch Delica Lancets 62B MISC Use to check blood sugar x per day. DX E11.9.   ONETOUCH VERIO test strip USE TO CHECK BLOOD SUGAR TWICE A DAY   pantoprazole (PROTONIX) 40 MG tablet TAKE 1 TABLET (40 MG TOTAL) BY MOUTH 2 (TWO) TIMES DAILY BEFORE A MEAL.   sertraline (ZOLOFT) 100 MG tablet TAKE 1/2 TABLET BY MOUTH (50 MG) BY MOUTH DAILY   simvastatin (ZOCOR) 10 MG tablet TAKE 1/2 TABLET (5 MG TOTAL) BY  MOUTH AT BEDTIME.   traMADol (ULTRAM) 50 MG tablet Take 50 mg by mouth every 12 (twelve) hours as needed.   traZODone (DESYREL) 100 MG tablet TAKE 1 TABLET BY MOUTH EVERYDAY AT BEDTIME   [DISCONTINUED] ALPRAZolam (XANAX) 0.25 MG tablet Take 0.25 mg by mouth daily as needed for anxiety.   No facility-administered encounter medications on file as of 10/30/2021.     Lab Results  Component Value Date    WBC 7.4 01/02/2021   HGB 13.4 01/02/2021   HCT 42.9 01/02/2021   PLT 182 01/02/2021   GLUCOSE 103 (H) 10/27/2021   CHOL 154 10/27/2021   TRIG 76.0 10/27/2021   HDL 66.50 10/27/2021   LDLDIRECT 146.5 09/19/2012   LDLCALC 72 10/27/2021   ALT 10 10/27/2021   AST 17 10/27/2021   NA 143 10/27/2021   K 4.0 10/27/2021   CL 105 10/27/2021   CREATININE 0.79 10/27/2021   BUN 21 10/27/2021   CO2 29 10/27/2021   TSH 2.53 05/12/2021   INR 1.0 08/21/2019   HGBA1C 5.9 10/27/2021   MICROALBUR 1.1 05/12/2021    MR THORACIC SPINE W WO CONTRAST  Result Date: 03/28/2021 CLINICAL DATA:  75 year old female with right side back pain and muscle spasms. Prior surgery. EXAM: MRI THORACIC WITHOUT AND WITH CONTRAST TECHNIQUE: Multiplanar and multiecho pulse sequences of the thoracic spine were obtained without and with intravenous contrast. CONTRAST:  36mL MULTIHANCE GADOBENATE DIMEGLUMINE 529 MG/ML IV SOLN COMPARISON:  Intraoperative images 12/29/2020. Thoracic spine MRI 10/16/2020. FINDINGS: Limited cervical spine imaging: Stable from last year. Multilevel mild disc bulging and probable chronic vacuum disc at C5-C6. Chronic left side facet degeneration at C3-C4. Thoracic spine segmentation: Appears to be normal. Same numbering system used in November. Alignment:  Stable thoracic kyphosis since last year. Vertebrae: Chronic hardware susceptibility artifact from posterior spinal fusion from the T10 level through the visible upper lumbar spine. Chronic T9 and T10 vertebral body marrow edema has not significantly changed since November. No new osseous abnormality identified. Normal background bone marrow signal. Cord: Chronic appearing small syrinx or prominence of the central spinal canal on axial images most pronounced at T9-T10 (series 6, image 25). No convincing cord myelomalacia. Lower thoracic cord and conus detail degraded by hardware susceptibility artifact. No abnormal intradural enhancement identified. No  definite dural thickening. Paraspinal and other soft tissues: Interval postoperative changes at T7-T8 on the left with enhancing granulation tissue (series 10, image 11). Otherwise stable. Chronic gastric hiatal hernia, small to moderate. Otherwise negative visible chest and upper abdominal viscera. Disc levels: T1-T2: Negative. T2-T3: Negative. T3-T4: Negative. T4-T5: Negative. T5-T6: Right paracentral to lateral recess disc protrusion is stable best seen on series 7, image 15. Stable borderline to mild associated spinal stenosis and right hemi cord mass effect. T6-T7: Broad-based and mildly lobulated central disc protrusion is stable along with borderline to mild spinal stenosis. (Series 7, image 17). T7-T8: Interval left laminectomy. Regressed although not completely resolved disc extrusion which is now in the midline above the disc space (series 7, image 19) and eccentric to the right at the disc space (series 6, image 20). Still, spinal stenosis and spinal cord mass effect are significantly regressed (compare to series 8, image 23 in November). Mild new facet hypertrophy on the right. No significant foraminal stenosis. T8-T9: Stable left paracentral disc extrusion with mild spinal stenosis and mild ventral cord mass effect (series 6, image 23). Mild to moderate facet and ligament flavum hypertrophy is greater on the right. Stable mild right  T8 foraminal stenosis. T9-T10: Chronic disc heterogeneity with endplate spurring and mild to moderate facet hypertrophy. This level is stable with no significant stenosis. T10-T11: Prior posterior fusion.  No stenosis. T11-T12: Prior posterior fusion.  No stenosis. T12-L1: Prior posterior fusion.  No stenosis. IMPRESSION: 1. Interval surgery on the left at T7-T8 with largely resolved spinal stenosis and spinal cord mass effect there. Mildly lobulated residual small disc extrusion. 2. Otherwise stable thoracic spine since November. Previous thoracolumbar fusion beginning at  T10. 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 Chronic disc herniations at T5-T6, T6-T7 and T8-T9 with up to mild spinal stenosis and spinal cord mass effect. Stable thoracic spinal cord signal, with small syrinx or prominent central canal most pronounced at T9-T10. 3. Cervical spine degeneration.  Chronic gastric hiatal hernia. Electronically Signed   By: Genevie Ann M.D.   On: 03/28/2021 05:32       Assessment & Plan:   Problem List Items Addressed This Visit     Aortic atherosclerosis ([place])    Continue simvastatin.       Diabetes mellitus type 2, diet-controlled ([facility])    Low carb diet and exercise.  Follow met b and a1c.   Lab Results  Component Value Date   HGBA1C 5.9 10/27/2021       GERD (gastroesophageal reflux disease)    No acid reflux reported.  On protonix.       Hypercholesterolemia    Continue simvastatin.  Low cholesterol diet and exercise.  Follow lipid panel and liver function tests.        Hypertension    Blood pressure doing well.  Continue losartan/hctz.  Follow pressures.  Follow metabolic panel.        Mild depression    Continue on zoloft.  Stable.  Follow.        Relevant Medications   ALPRAZolam (XANAX) 0.25 MG tablet   Obesity (BMI 30-39.9)    Discussed diet and exercise.  Follow.       Sinusitis    Saline nasal spray and steroid nasal spray.  augmentin as directed.  Mucinex. Probiotics as directed.  Follow.        Relevant Medications   amoxicillin-clavulanate (AUGMENTIN) 875-125 MG tablet     [name], MD

## 2021-10-31 DIAGNOSIS — K219 Gastro-esophageal reflux disease without esophagitis: Secondary | ICD-10-CM | POA: Diagnosis not present

## 2021-10-31 DIAGNOSIS — E119 Type 2 diabetes mellitus without complications: Secondary | ICD-10-CM | POA: Diagnosis not present

## 2021-10-31 DIAGNOSIS — G4700 Insomnia, unspecified: Secondary | ICD-10-CM | POA: Diagnosis not present

## 2021-10-31 DIAGNOSIS — I10 Essential (primary) hypertension: Secondary | ICD-10-CM | POA: Diagnosis not present

## 2021-10-31 DIAGNOSIS — E669 Obesity, unspecified: Secondary | ICD-10-CM | POA: Diagnosis not present

## 2021-10-31 DIAGNOSIS — Z8249 Family history of ischemic heart disease and other diseases of the circulatory system: Secondary | ICD-10-CM | POA: Diagnosis not present

## 2021-10-31 DIAGNOSIS — G8929 Other chronic pain: Secondary | ICD-10-CM | POA: Diagnosis not present

## 2021-10-31 DIAGNOSIS — E785 Hyperlipidemia, unspecified: Secondary | ICD-10-CM | POA: Diagnosis not present

## 2021-10-31 DIAGNOSIS — Z79899 Other long term (current) drug therapy: Secondary | ICD-10-CM | POA: Diagnosis not present

## 2021-10-31 DIAGNOSIS — J301 Allergic rhinitis due to pollen: Secondary | ICD-10-CM | POA: Diagnosis not present

## 2021-10-31 DIAGNOSIS — Z7984 Long term (current) use of oral hypoglycemic drugs: Secondary | ICD-10-CM | POA: Diagnosis not present

## 2021-10-31 DIAGNOSIS — R69 Illness, unspecified: Secondary | ICD-10-CM | POA: Diagnosis not present

## 2021-10-31 DIAGNOSIS — I2510 Atherosclerotic heart disease of native coronary artery without angina pectoris: Secondary | ICD-10-CM | POA: Diagnosis not present

## 2021-10-31 DIAGNOSIS — F419 Anxiety disorder, unspecified: Secondary | ICD-10-CM | POA: Diagnosis not present

## 2021-11-04 ENCOUNTER — Encounter: Payer: Self-pay | Admitting: Internal Medicine

## 2021-11-04 NOTE — Assessment & Plan Note (Signed)
No acid reflux reported.  On protonix.  

## 2021-11-04 NOTE — Assessment & Plan Note (Signed)
Low carb diet and exercise.  Follow met b and a1c.     °Lab Results  °Component Value Date  ° HGBA1C 5.9 10/27/2021  ° °

## 2021-11-04 NOTE — Assessment & Plan Note (Signed)
Continue on zoloft.  Stable.  Follow.   

## 2021-11-04 NOTE — Assessment & Plan Note (Signed)
Continue simvastatin.  Low cholesterol diet and exercise.  Follow lipid panel and liver function tests.   

## 2021-11-04 NOTE — Assessment & Plan Note (Addendum)
Saline nasal spray and steroid nasal spray.  augmentin as directed.  Mucinex. Probiotics as directed.  Follow.

## 2021-11-04 NOTE — Assessment & Plan Note (Signed)
Continue simvastatin. 

## 2021-11-04 NOTE — Assessment & Plan Note (Signed)
Blood pressure doing well.  Continue losartan/hctz.  Follow pressures.  Follow metabolic panel.

## 2021-11-04 NOTE — Assessment & Plan Note (Signed)
Discussed diet and exercise.  Follow.  

## 2021-11-07 DIAGNOSIS — J301 Allergic rhinitis due to pollen: Secondary | ICD-10-CM | POA: Diagnosis not present

## 2021-11-09 ENCOUNTER — Other Ambulatory Visit: Payer: Self-pay

## 2021-11-09 ENCOUNTER — Ambulatory Visit
Admission: RE | Admit: 2021-11-09 | Discharge: 2021-11-09 | Disposition: A | Discharge status: Home / Self Care | Payer: Medicare HMO | Source: Ambulatory Visit | Attending: Internal Medicine | Admitting: Internal Medicine

## 2021-11-09 DIAGNOSIS — Z1231 Encounter for screening mammogram for malignant neoplasm of breast: Secondary | ICD-10-CM | POA: Insufficient documentation

## 2021-11-15 ENCOUNTER — Telehealth: Payer: Self-pay | Admitting: Internal Medicine

## 2021-11-15 NOTE — Telephone Encounter (Signed)
Patient confirmed that she is feeling better. She wanted to know why she was still testing positive even though her symptoms are pretty much resolved. Advised that she may continue to test positive for awhile after having covid. Patient will continue to follow quarantine guidelines and wear a mask if she goes out. Advised patient to let me know if she needs anything.

## 2021-11-15 NOTE — Telephone Encounter (Signed)
Pt called in stating she have questions about Covid. Pt stated that she tested positive last Friday 11/10/21. Pt stated that she did an at home test yesterday and the test is still showing positive. Pt was wondering if her mucus was causing her to still have Covid. Pt requesting callback

## 2021-11-18 ENCOUNTER — Other Ambulatory Visit: Payer: Self-pay | Admitting: Internal Medicine

## 2021-11-19 ENCOUNTER — Other Ambulatory Visit: Payer: Self-pay | Admitting: Internal Medicine

## 2021-11-21 ENCOUNTER — Other Ambulatory Visit: Payer: Self-pay | Admitting: Internal Medicine

## 2021-11-24 ENCOUNTER — Telehealth (INDEPENDENT_AMBULATORY_CARE_PROVIDER_SITE_OTHER): Payer: Medicare HMO | Admitting: Internal Medicine

## 2021-11-24 VITALS — Ht 65.0 in | Wt 193.0 lb

## 2021-11-24 DIAGNOSIS — U071 COVID-19: Secondary | ICD-10-CM

## 2021-11-24 DIAGNOSIS — E119 Type 2 diabetes mellitus without complications: Secondary | ICD-10-CM

## 2021-11-24 DIAGNOSIS — I10 Essential (primary) hypertension: Secondary | ICD-10-CM

## 2021-11-24 DIAGNOSIS — E7800 Pure hypercholesterolemia, unspecified: Secondary | ICD-10-CM

## 2021-11-24 DIAGNOSIS — I700 Atherosclerosis of aorta: Secondary | ICD-10-CM | POA: Diagnosis not present

## 2021-11-24 MED ORDER — PREDNISONE 10 MG PO TABS: ORAL_TABLET | ORAL | 0 refills | Status: DC

## 2021-11-24 MED ORDER — CEFDINIR 300 MG PO CAPS: 300.0000 mg | ORAL_CAPSULE | Freq: Two times a day (BID) | ORAL | 0 refills | Status: DC

## 2021-11-24 NOTE — Progress Notes (Signed)
Patient ID: Erin Camacho, female   DOB: 05-Jan-1946, 76 y.o.   MRN: [id number]   Virtual Visit via video Note  This visit type was conducted due to national recommendations for restrictions regarding the COVID-19 pandemic (e.g. social distancing).  This format is felt to be most appropriate for this patient at this time.  All issues noted in this document were discussed and addressed.  No physical exam was performed (except for noted visual exam findings with Video Visits).   I connected with Erin Camacho by a video enabled telemedicine application and verified that I am speaking with the correct person using two identifiers. Location patient: home Location provider: work Persons participating in the virtual visit: patient, provider  The limitations, risks, security and privacy concerns of performing an evaluation and management service by video and the availability of in person appointments have been discussed.  It has also been discussed with the patient that there may be a patient responsible charge related to this service. The patient expressed understanding and agreed to proceed.   Reason for visit: work in appt  HPI: Diagnosed with covid 11/10/21.  Symptoms started with sore throat, headache and cough.  Symptoms improved some initially.  She is now reporting persistent head and sinus congestion. Increased cough.  Having coughing fits.  Blowing thick yellow mucus and coughing up thick yellow mucus.  No chest pain or sob.  No chest tightness.  Some decreased appetite.  No nausea or vomiting.  Has been taking mucinex.  Also used afrin a few nights.     ROS: See pertinent positives and negatives per HPI.  Past Medical History:  Diagnosis Date   Allergy    hay fever   Anxiety    Arthritis    Chronic back pain    Diabetes mellitus    Dyspnea    a. 07/2017 Echo: EF 60-65%. No rwma. Mildly dil LA. Nl RV fxn.   GERD (gastroesophageal reflux disease)    History of hiatal hernia     Hyperlipidemia    Hypertension    Osteoarthritis    Pneumonia    Spinal stenosis    Type 2 diabetes mellitus ([place])    UTI (lower urinary tract infection)     Past Surgical History:  Procedure Laterality Date   ABDOMINAL HYSTERECTOMY  1973   ABDOMINAL HYSTERECTOMY     APPENDECTOMY     APPLICATION OF ROBOTIC ASSISTANCE FOR SPINAL PROCEDURE N/A 09/15/2018   Procedure: APPLICATION OF ROBOTIC ASSISTANCE FOR SPINAL PROCEDURE;  Surgeon: Kristeen Miss, MD;  Location: [place];  Service: Neurosurgery;  Laterality: N/A;   BACK SURGERY  04/17/13, 2017, 2019   CATARACT EXTRACTION W/PHACO Left 12/16/2019   Procedure: CATARACT EXTRACTION PHACO AND INTRAOCULAR LENS PLACEMENT (IOC) LEFT PANOPTIX LENS DIABETIC 8.07 01:19.2 10.2%;  Surgeon: Leandrew Koyanagi, MD;  Location: [place];  Service: Ophthalmology;  Laterality: Left;  Diabetic - oral meds   CATARACT EXTRACTION W/PHACO Right 01/06/2020   Procedure: CATARACT EXTRACTION PHACO AND INTRAOCULAR LENS PLACEMENT (IOC) PANOPTIX LENS RIGHT DIABETIC 10.73 01:08.6 15.7%;  Surgeon: Leandrew Koyanagi, MD;  Location: [place];  Service: Ophthalmology;  Laterality: Right;  diabetic   CHOLECYSTECTOMY  1995   COLONOSCOPY WITH PROPOFOL N/A 03/15/2021   Procedure: COLONOSCOPY WITH PROPOFOL;  Surgeon: Robert Bellow, MD;  Location: [facility];  Service: Endoscopy;  Laterality: N/A;   EYE SURGERY  2021   bilateral cataract removal with lens implants   HERNIA REPAIR  08/2006   lap band  08/2003   LUMBAR LAMINECTOMY/ DECOMPRESSION WITH MET-RX Left 12/29/2020   Procedure: Left Thoracic Seven-Eight Microdiscectomy;  Surgeon: Kristeen Miss, MD;  Location: [place];  Service: Neurosurgery;  Laterality: Left;   SEPTOPLASTY  04/1978   TONSILLECTOMY AND ADENOIDECTOMY  1979   TOTAL HIP ARTHROPLASTY  02/2012   Right, Dr. Marry Guan   TOTAL HIP ARTHROPLASTY Left 08/31/2019   Procedure: TOTAL HIP ARTHROPLASTY;  Surgeon: Dereck Leep, MD;  Location:  [facility];  Service: Orthopedics;  Laterality: Left;    Family History  Problem Relation Age of Onset   Hypertension Mother    Stroke Mother    Heart disease Mother    Hypertension Father    Stroke Father    Heart disease Father    Arthritis Sister    Diabetes Sister    Cancer Sister    Diabetes Brother    Hypertension Brother    Dementia Brother    Diabetes Brother    Hypertension Brother    Dementia Brother    Breast cancer Other 28   Cancer Other        breast   Diabetes Other    Kidney disease Neg Hx    Bladder Cancer Neg Hx     SOCIAL HX: reviewed.    Current Outpatient Medications:    cefdinir (OMNICEF) 300 MG capsule, Take 1 capsule (300 mg total) by mouth 2 (two) times daily., Disp: 20 capsule, Rfl: 0   predniSONE (DELTASONE) 10 MG tablet, Take 4 tablets x 1 day and then decrease by 1/2 tablet per day until down to zero mg., Disp: 18 tablet, Rfl: 0   acetaminophen (TYLENOL) 650 MG CR tablet, Take 1,950 mg by mouth in the morning and at bedtime., Disp: , Rfl:    ALPRAZolam (XANAX) 0.25 MG tablet, Take 1 tablet (0.25 mg total) by mouth daily as needed for anxiety., Disp: 30 tablet, Rfl: 0   baclofen (LIORESAL) 10 MG tablet, Take 10 mg by mouth 3 (three) times daily., Disp: , Rfl:    Camphor-Menthol-Methyl Sal (SALONPAS) 3.11-24-08 % PTCH, Place 1 patch onto the skin daily as needed (pain.)., Disp: , Rfl:    cholecalciferol (VITAMIN D) 25 MCG (1000 UNIT) tablet, Take 1,000 Units by mouth daily., Disp: , Rfl:    Ferrous Sulfate (IRON) 325 (65 Fe) MG TABS, Take 325 mg by mouth daily., Disp: , Rfl:    fexofenadine (ALLEGRA) 180 MG tablet, Take 1 tablet (180 mg total) by mouth daily., Disp: 90 tablet, Rfl: 4   fluticasone (FLONASE) 50 MCG/ACT nasal spray, SPRAY 2 SPRAYS INTO EACH NOSTRIL EVERY DAY, Disp: 48 mL, Rfl: 1   losartan-hydrochlorothiazide (HYZAAR) 50-12.5 MG tablet, TAKE 1 TABLET BY MOUTH EVERY DAY, Disp: 90 tablet, Rfl: 2   metFORMIN (GLUCOPHAGE) 500 MG tablet,  TAKE 1 TABLET BY MOUTH EVERY DAY, Disp: 90 tablet, Rfl: 1   Multiple Vitamin (MULTIVITAMIN WITH MINERALS) TABS tablet, Take 1 tablet by mouth daily., Disp: , Rfl:    OneTouch Delica Lancets 87O MISC, Use to check blood sugar x per day. DX E11.9., Disp: 100 each, Rfl: 3   ONETOUCH VERIO test strip, USE TO CHECK BLOOD SUGAR TWICE A DAY, Disp: 100 strip, Rfl: 12   pantoprazole (PROTONIX) 40 MG tablet, TAKE 1 TABLET (40 MG TOTAL) BY MOUTH 2 (TWO) TIMES DAILY BEFORE A MEAL., Disp: 180 tablet, Rfl: 3   sertraline (ZOLOFT) 100 MG tablet, TAKE 1/2 TABLET BY MOUTH (50 MG) BY MOUTH DAILY, Disp: 45 tablet, Rfl: 1  simvastatin (ZOCOR) 10 MG tablet, TAKE 1/2 TABLET (5 MG TOTAL) BY MOUTH AT BEDTIME., Disp: 45 tablet, Rfl: 3   traMADol (ULTRAM) 50 MG tablet, Take 50 mg by mouth every 12 (twelve) hours as needed., Disp: , Rfl:    traZODone (DESYREL) 100 MG tablet, TAKE 1 TABLET BY MOUTH EVERYDAY AT BEDTIME, Disp: 90 tablet, Rfl: 1  EXAM:  GENERAL: alert, oriented, appears well and in no acute distress  HEENT: atraumatic, conjunttiva clear, no obvious abnormalities on inspection of external nose and ears  NECK: normal movements of the head and neck  LUNGS: on inspection no signs of respiratory distress, breathing rate appears normal, no obvious gross SOB, gasping or wheezing  CV: no obvious cyanosis  PSYCH/NEURO: pleasant and cooperative, no obvious depression or anxiety, speech and thought processing grossly intact  ASSESSMENT AND PLAN:  Discussed the following assessment and plan:  Problem List Items Addressed This Visit     Aortic atherosclerosis (HCC)    Continue simvastatin.       COVID-19 virus infection    Recent covid infection.  Initial symptoms improved.  Now with increased head congestion, drainage and cough as outlined.  Concern regarding possible sinus infection/URI.  Treat with ominicef.  Continue probiotics.  Continue mucinex.  Saline nasal spray and steroid nasal spray as directed.   Follow.  Call with update.        Relevant Medications   cefdinir (OMNICEF) 300 MG capsule   Diabetes mellitus type 2, diet-controlled (HCC) - Primary    Low carb diet and exercise.  Follow met b and a1c.  Discussed prednisone will increase sugar.  Follow.  Stay hydrated.   Lab Results  Component Value Date   HGBA1C 5.9 10/27/2021       Relevant Orders   Hemoglobin Y6V   Basic metabolic panel   Hypercholesterolemia    Continue simvastatin.  Low cholesterol diet and exercise.  Follow lipid panel and liver function tests.        Relevant Orders   Lipid panel   Hepatic function panel   Hypertension    Blood pressure doing well.  Continue losartan/hctz.  Follow pressures.  Follow metabolic panel.        Relevant Orders   CBC with Differential/Platelet    Return if symptoms worsen or fail to improve.   I discussed the assessment and treatment plan with the patient. The patient was provided an opportunity to ask questions and all were answered. The patient agreed with the plan and demonstrated an understanding of the instructions.   The patient was advised to call back or seek an in-person evaluation if the symptoms worsen or if the condition fails to improve as anticipated.    [name], MD

## 2021-11-25 ENCOUNTER — Encounter: Payer: Self-pay | Admitting: Internal Medicine

## 2021-11-25 DIAGNOSIS — U071 COVID-19: Secondary | ICD-10-CM | POA: Insufficient documentation

## 2021-11-25 DIAGNOSIS — Z8616 Personal history of COVID-19: Secondary | ICD-10-CM | POA: Insufficient documentation

## 2021-11-25 NOTE — Assessment & Plan Note (Signed)
Continue simvastatin. 

## 2021-11-25 NOTE — Assessment & Plan Note (Signed)
Blood pressure doing well.  Continue losartan/hctz.  Follow pressures.  Follow metabolic panel.

## 2021-11-25 NOTE — Assessment & Plan Note (Addendum)
Low carb diet and exercise.  Follow met b and a1c.  Discussed prednisone will increase sugar.  Follow.  Stay hydrated.   Lab Results  Component Value Date   HGBA1C 5.9 10/27/2021

## 2021-11-25 NOTE — Assessment & Plan Note (Addendum)
Recent covid infection.  Initial symptoms improved.  Now with increased head congestion, drainage and cough as outlined.  Concern regarding possible sinus infection/URI.  Treat with ominicef.  Continue probiotics.  Continue mucinex.  Saline nasal spray and steroid nasal spray as directed.  Follow.  Call with update.

## 2021-11-25 NOTE — Assessment & Plan Note (Signed)
Continue simvastatin.  Low cholesterol diet and exercise.  Follow lipid panel and liver function tests.   

## 2021-11-28 DIAGNOSIS — J301 Allergic rhinitis due to pollen: Secondary | ICD-10-CM | POA: Diagnosis not present

## 2021-11-28 DIAGNOSIS — E119 Type 2 diabetes mellitus without complications: Secondary | ICD-10-CM | POA: Diagnosis not present

## 2021-11-28 LAB — HM DIABETES EYE EXAM

## 2021-11-29 DIAGNOSIS — J301 Allergic rhinitis due to pollen: Secondary | ICD-10-CM | POA: Diagnosis not present

## 2021-12-05 DIAGNOSIS — J301 Allergic rhinitis due to pollen: Secondary | ICD-10-CM | POA: Diagnosis not present

## 2021-12-12 DIAGNOSIS — J301 Allergic rhinitis due to pollen: Secondary | ICD-10-CM | POA: Diagnosis not present

## 2021-12-19 DIAGNOSIS — J301 Allergic rhinitis due to pollen: Secondary | ICD-10-CM | POA: Diagnosis not present

## 2022-01-01 ENCOUNTER — Encounter: Payer: Self-pay | Admitting: Internal Medicine

## 2022-01-01 NOTE — Progress Notes (Signed)
[id number] °

## 2022-01-02 DIAGNOSIS — J301 Allergic rhinitis due to pollen: Secondary | ICD-10-CM | POA: Diagnosis not present

## 2022-01-09 DIAGNOSIS — J301 Allergic rhinitis due to pollen: Secondary | ICD-10-CM | POA: Diagnosis not present

## 2022-01-10 ENCOUNTER — Other Ambulatory Visit: Payer: Self-pay

## 2022-01-10 ENCOUNTER — Ambulatory Visit (INDEPENDENT_AMBULATORY_CARE_PROVIDER_SITE_OTHER): Payer: Medicare HMO | Admitting: Internal Medicine

## 2022-01-10 ENCOUNTER — Encounter: Payer: Self-pay | Admitting: Internal Medicine

## 2022-01-10 DIAGNOSIS — E119 Type 2 diabetes mellitus without complications: Secondary | ICD-10-CM | POA: Diagnosis not present

## 2022-01-10 DIAGNOSIS — Z8616 Personal history of COVID-19: Secondary | ICD-10-CM

## 2022-01-10 DIAGNOSIS — I700 Atherosclerosis of aorta: Secondary | ICD-10-CM

## 2022-01-10 DIAGNOSIS — K219 Gastro-esophageal reflux disease without esophagitis: Secondary | ICD-10-CM | POA: Diagnosis not present

## 2022-01-10 DIAGNOSIS — F320 Major depressive disorder, single episode, mild: Secondary | ICD-10-CM | POA: Diagnosis not present

## 2022-01-10 DIAGNOSIS — I10 Essential (primary) hypertension: Secondary | ICD-10-CM | POA: Diagnosis not present

## 2022-01-10 DIAGNOSIS — E7800 Pure hypercholesterolemia, unspecified: Secondary | ICD-10-CM

## 2022-01-10 DIAGNOSIS — R69 Illness, unspecified: Secondary | ICD-10-CM | POA: Diagnosis not present

## 2022-01-10 MED ORDER — AZELASTINE HCL 0.1 % NA SOLN: 1.0000 | Freq: Two times a day (BID) | NASAL | 0 refills | Status: DC

## 2022-01-10 NOTE — Progress Notes (Signed)
Patient ID: Erin Camacho, female   DOB: July 19, 1946, 76 y.o.   MRN: [id number]   Subjective:    Patient ID: Erin Camacho, female    DOB: January 11, 1946, 76 y.o.   MRN: [id number]  This visit occurred during the SARS-CoV-2 public health emergency.  Safety protocols were in place, including screening questions prior to the visit, additional usage of staff PPE, and extensive cleaning of exam room while observing appropriate contact time as indicated for disinfecting solutions.   Patient here for a scheduled follow up.   Chief Complaint  Patient presents with   Follow-up    97mo f/u DM, HTN   .   HPI Had covid 11/10/21.  Had persistent head congestion and cough.  Reevaluated 11/24/21 - treated with omnicef.  She is better.  Still with some drainage and mucus in her throat.  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  No chest pain or sob reported.  No abdominal pain or bowel change reported.  No increased cough.    Past Medical History:  Diagnosis Date   Allergy    hay fever   Anxiety    Arthritis    Chronic back pain    Diabetes mellitus    Dyspnea    a. 07/2017 Echo: EF 60-65%. No rwma. Mildly dil LA. Nl RV fxn.   GERD (gastroesophageal reflux disease)    History of hiatal hernia    Hyperlipidemia    Hypertension    Osteoarthritis    Pneumonia    Spinal stenosis    Type 2 diabetes mellitus ([place])    UTI (lower urinary tract infection)    Past Surgical History:  Procedure Laterality Date   ABDOMINAL HYSTERECTOMY  1973   ABDOMINAL HYSTERECTOMY     APPENDECTOMY     APPLICATION OF ROBOTIC ASSISTANCE FOR SPINAL PROCEDURE N/A 09/15/2018   Procedure: APPLICATION OF ROBOTIC ASSISTANCE FOR SPINAL PROCEDURE;  Surgeon: Kristeen Miss, MD;  Location: [place];  Service: Neurosurgery;  Laterality: N/A;   BACK SURGERY  04/17/13, 2017, 2019   CATARACT EXTRACTION W/PHACO Left 12/16/2019   Procedure: CATARACT EXTRACTION PHACO AND INTRAOCULAR LENS PLACEMENT (IOC) LEFT PANOPTIX LENS DIABETIC 8.07 01:19.2 10.2%;   Surgeon: Leandrew Koyanagi, MD;  Location: [place];  Service: Ophthalmology;  Laterality: Left;  Diabetic - oral meds   CATARACT EXTRACTION W/PHACO Right 01/06/2020   Procedure: CATARACT EXTRACTION PHACO AND INTRAOCULAR LENS PLACEMENT (IOC) PANOPTIX LENS RIGHT DIABETIC 10.73 01:08.6 15.7%;  Surgeon: Leandrew Koyanagi, MD;  Location: [place];  Service: Ophthalmology;  Laterality: Right;  diabetic   CHOLECYSTECTOMY  1995   COLONOSCOPY WITH PROPOFOL N/A 03/15/2021   Procedure: COLONOSCOPY WITH PROPOFOL;  Surgeon: Robert Bellow, MD;  Location: [facility];  Service: Endoscopy;  Laterality: N/A;   EYE SURGERY  2021   bilateral cataract removal with lens implants   HERNIA REPAIR  08/2006   lap band  08/2003   LUMBAR LAMINECTOMY/ DECOMPRESSION WITH MET-RX Left 12/29/2020   Procedure: Left Thoracic Seven-Eight Microdiscectomy;  Surgeon: Kristeen Miss, MD;  Location: [place];  Service: Neurosurgery;  Laterality: Left;   SEPTOPLASTY  04/1978   TONSILLECTOMY AND ADENOIDECTOMY  1979   TOTAL HIP ARTHROPLASTY  02/2012   Right, Dr. Marry Guan   TOTAL HIP ARTHROPLASTY Left 08/31/2019   Procedure: TOTAL HIP ARTHROPLASTY;  Surgeon: Dereck Leep, MD;  Location: [facility];  Service: Orthopedics;  Laterality: Left;   Family History  Problem Relation Age of Onset   Hypertension Mother    Stroke  Mother    Heart disease Mother    Hypertension Father    Stroke Father    Heart disease Father    Arthritis Sister    Diabetes Sister    Cancer Sister    Diabetes Brother    Hypertension Brother    Dementia Brother    Diabetes Brother    Hypertension Brother    Dementia Brother    Breast cancer Other 13   Cancer Other        breast   Diabetes Other    Kidney disease Neg Hx    Bladder Cancer Neg Hx    Social History   Socioeconomic History   Marital status: Married    Spouse name: Not on file   Number of children: Not on file   Years of education: Not on file   Highest  education level: Not on file  Occupational History   Not on file  Tobacco Use   Smoking status: Never   Smokeless tobacco: Never  Vaping Use   Vaping Use: Never used  Substance and Sexual Activity   Alcohol use: Yes    Alcohol/week: 0.0 - 1.0 standard drinks    Comment: RARELY   Drug use: No   Sexual activity: Not Currently  Other Topics Concern   Not on file  Social History Narrative   Lives in [place] with husband.      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 Strain: Low Risk    Difficulty of Paying Living Expenses: Not hard at [place]: No Food Insecurity   Worried About Charity fundraiser in [place]: Never true   Arboriculturist in the Last Year: Never true  Transportation Needs: No Data processing manager (Medical): No   Lack of Transportation (Non-Medical): No  Physical Activity: Unknown   Days of Exercise per Week: 0 days   Minutes of Exercise per Session: Not on file  Stress: No Stress Concern Present   Feeling of Stress : Not at all  Social Connections: Unknown   Frequency of Communication with Friends and Family: Not on file   Frequency of Social Gatherings with Friends and Family: Not on file   Attends Religious Services: Not on file   Active Member of Clubs or Organizations: Not on file   Attends Archivist Meetings: Not on file   Marital Status: Married     Review of Systems  Constitutional:  Negative for appetite change, fever and unexpected weight change.  HENT:  Positive for postnasal drip. Negative for sinus pressure.        Mucus in throat.   Respiratory:  Negative for cough, chest tightness and shortness of breath.   Cardiovascular:  Negative for chest pain, palpitations and leg swelling.  Gastrointestinal:  Negative for abdominal pain, diarrhea, nausea and vomiting.  Genitourinary:  Negative for difficulty urinating and dysuria.  Musculoskeletal:   Negative for joint swelling and myalgias.  Skin:  Negative for color change and rash.  Neurological:  Negative for dizziness, light-headedness and headaches.  Psychiatric/Behavioral:  Negative for agitation and dysphoric mood.       Objective:     BP 128/78    Pulse 61    Temp 98.5 F (36.9 C) (Oral)    Resp 15    Ht $R'5\' 5"'OI$  (1.651 m)    Wt 196 lb (88.9 kg)    SpO2  97%    BMI 32.62 kg/m  Wt Readings from Last 3 Encounters:  01/10/22 196 lb (88.9 kg)  11/24/21 193 lb (87.5 kg)  10/30/21 199 lb (90.3 kg)    Physical Exam Vitals reviewed.  Constitutional:      General: She is not in acute distress.    Appearance: Normal appearance.  HENT:     Head: Normocephalic and atraumatic.     Right Ear: External ear normal.     Left Ear: External ear normal.  Eyes:     General: No scleral icterus.       Right eye: No discharge.        Left eye: No discharge.     Conjunctiva/sclera: Conjunctivae normal.  Neck:     Thyroid: No thyromegaly.  Cardiovascular:     Rate and Rhythm: Normal rate and regular rhythm.  Pulmonary:     Effort: No respiratory distress.     Breath sounds: Normal breath sounds. No wheezing.  Abdominal:     General: Bowel sounds are normal.     Palpations: Abdomen is soft.     Tenderness: There is no abdominal tenderness.  Musculoskeletal:        General: No swelling or tenderness.     Cervical back: Neck supple. No tenderness.  Lymphadenopathy:     Cervical: No cervical adenopathy.  Skin:    Findings: No erythema or rash.  Neurological:     Mental Status: She is alert.  Psychiatric:        Mood and Affect: Mood normal.        Behavior: Behavior normal.     Outpatient Encounter Medications as of 01/10/2022  Medication Sig   acetaminophen (TYLENOL) 650 MG CR tablet Take 1,950 mg by mouth in the morning and at bedtime.   ALPRAZolam (XANAX) 0.25 MG tablet Take 1 tablet (0.25 mg total) by mouth daily as needed for anxiety.   azelastine (ASTELIN) 0.1 % nasal  spray Place 1 spray into both nostrils 2 (two) times daily. Use in each nostril as directed   baclofen (LIORESAL) 10 MG tablet Take 10 mg by mouth 3 (three) times daily.   cholecalciferol (VITAMIN D) 25 MCG (1000 UNIT) tablet Take 1,000 Units by mouth daily.   Ferrous Sulfate (IRON) 325 (65 Fe) MG TABS Take 325 mg by mouth daily.   fexofenadine (ALLEGRA) 180 MG tablet Take 1 tablet (180 mg total) by mouth daily.   fluticasone (FLONASE) 50 MCG/ACT nasal spray SPRAY 2 SPRAYS INTO EACH NOSTRIL EVERY DAY   losartan-hydrochlorothiazide (HYZAAR) 50-12.5 MG tablet TAKE 1 TABLET BY MOUTH EVERY DAY   metFORMIN (GLUCOPHAGE) 500 MG tablet TAKE 1 TABLET BY MOUTH EVERY DAY   Multiple Vitamin (MULTIVITAMIN WITH MINERALS) TABS tablet Take 1 tablet by mouth daily.   OneTouch Delica Lancets 58I MISC Use to check blood sugar x per day. DX E11.9.   ONETOUCH VERIO test strip USE TO CHECK BLOOD SUGAR TWICE A DAY   pantoprazole (PROTONIX) 40 MG tablet TAKE 1 TABLET (40 MG TOTAL) BY MOUTH 2 (TWO) TIMES DAILY BEFORE A MEAL.   sertraline (ZOLOFT) 100 MG tablet TAKE 1/2 TABLET BY MOUTH (50 MG) BY MOUTH DAILY   simvastatin (ZOCOR) 10 MG tablet TAKE 1/2 TABLET (5 MG TOTAL) BY MOUTH AT BEDTIME.   traMADol (ULTRAM) 50 MG tablet Take 50 mg by mouth every 12 (twelve) hours as needed.   traZODone (DESYREL) 100 MG tablet TAKE 1 TABLET BY MOUTH EVERYDAY AT BEDTIME   [DISCONTINUED]  Camphor-Menthol-Methyl Sal (SALONPAS) 3.11-24-08 % PTCH Place 1 patch onto the skin daily as needed (pain.). (Patient not taking: Reported on 01/10/2022)   [DISCONTINUED] cefdinir (OMNICEF) 300 MG capsule Take 1 capsule (300 mg total) by mouth 2 (two) times daily.   [DISCONTINUED] predniSONE (DELTASONE) 10 MG tablet Take 4 tablets x 1 day and then decrease by 1/2 tablet per day until down to zero mg.   No facility-administered encounter medications on file as of 01/10/2022.     Lab Results  Component Value Date   WBC 7.4 01/02/2021   HGB 13.4  01/02/2021   HCT 42.9 01/02/2021   PLT 182 01/02/2021   GLUCOSE 103 (H) 10/27/2021   CHOL 154 10/27/2021   TRIG 76.0 10/27/2021   HDL 66.50 10/27/2021   LDLDIRECT 146.5 09/19/2012   LDLCALC 72 10/27/2021   ALT 10 10/27/2021   AST 17 10/27/2021   NA 143 10/27/2021   K 4.0 10/27/2021   CL 105 10/27/2021   CREATININE 0.79 10/27/2021   BUN 21 10/27/2021   CO2 29 10/27/2021   TSH 2.53 05/12/2021   INR 1.0 08/21/2019   HGBA1C 5.9 10/27/2021   MICROALBUR 1.1 05/12/2021    MM 3D SCREEN BREAST BILATERAL  Result Date: 11/09/2021 CLINICAL DATA:  Screening. EXAM: DIGITAL SCREENING BILATERAL MAMMOGRAM WITH TOMOSYNTHESIS AND CAD TECHNIQUE: Bilateral screening digital craniocaudal and mediolateral oblique mammograms were obtained. Bilateral screening digital breast tomosynthesis was performed. The images were evaluated with computer-aided detection. COMPARISON:  Previous exam(s). ACR Breast Density Category b: There are scattered areas of fibroglandular density. FINDINGS: There are no findings suspicious for malignancy. IMPRESSION: No mammographic evidence of malignancy. A result letter of this screening mammogram will be mailed directly to the patient. RECOMMENDATION: Screening mammogram in one year. (Code:SM-B-01Y) BI-RADS CATEGORY  1: Negative. Electronically Signed   By: Valentino Saxon M.D.   On: 11/09/2021 15:10      Assessment & Plan:   Problem List Items Addressed This Visit     Aortic atherosclerosis ([place])    Continue simvastatin.       Depression, major, single episode, mild ([place])    Continue on zoloft.  Stable.  Follow.        Diabetes mellitus type 2, diet-controlled (HCC)    Low carb diet and exercise.  Follow met b and a1c.     Lab Results  Component Value Date   HGBA1C 5.9 10/27/2021       GERD (gastroesophageal reflux disease)    No acid reflux reported.  On protonix.       History of COVID-19    Doing better overall.  Residual drainage and mucus in her  throat. Can continue flonase and allegra.  Add astelin nasal spray.  Follow.  Call with update.       Hypercholesterolemia    Continue simvastatin.  Low cholesterol diet and exercise.  Follow lipid panel and liver function tests.        Relevant Orders   TSH   Hypertension    Blood pressure doing well.  Continue losartan/hctz.  Follow pressures.  Follow metabolic panel.          [name], MD

## 2022-01-16 ENCOUNTER — Encounter: Payer: Self-pay | Admitting: Internal Medicine

## 2022-01-16 DIAGNOSIS — J301 Allergic rhinitis due to pollen: Secondary | ICD-10-CM | POA: Diagnosis not present

## 2022-01-16 NOTE — Assessment & Plan Note (Signed)
Continue simvastatin.  Low cholesterol diet and exercise.  Follow lipid panel and liver function tests.   

## 2022-01-16 NOTE — Assessment & Plan Note (Signed)
Blood pressure doing well.  Continue losartan/hctz.  Follow pressures.  Follow metabolic panel.

## 2022-01-16 NOTE — Assessment & Plan Note (Signed)
No acid reflux reported.  On protonix.  

## 2022-01-16 NOTE — Assessment & Plan Note (Signed)
Doing better overall.  Residual drainage and mucus in her throat. Can continue flonase and allegra.  Add astelin nasal spray.  Follow.  Call with update.

## 2022-01-16 NOTE — Assessment & Plan Note (Signed)
Low carb diet and exercise.  Follow met b and a1c.     Lab Results  Component Value Date   HGBA1C 5.9 10/27/2021

## 2022-01-16 NOTE — Assessment & Plan Note (Signed)
Continue simvastatin. 

## 2022-01-16 NOTE — Assessment & Plan Note (Signed)
Continue on zoloft.  Stable.  Follow.   

## 2022-01-23 DIAGNOSIS — J301 Allergic rhinitis due to pollen: Secondary | ICD-10-CM | POA: Diagnosis not present

## 2022-01-30 ENCOUNTER — Encounter: Payer: Self-pay | Admitting: Internal Medicine

## 2022-01-30 DIAGNOSIS — J301 Allergic rhinitis due to pollen: Secondary | ICD-10-CM | POA: Diagnosis not present

## 2022-01-31 ENCOUNTER — Telehealth: Payer: Self-pay | Admitting: Internal Medicine

## 2022-01-31 ENCOUNTER — Ambulatory Visit (INDEPENDENT_AMBULATORY_CARE_PROVIDER_SITE_OTHER): Payer: Medicare HMO | Admitting: Internal Medicine

## 2022-01-31 ENCOUNTER — Other Ambulatory Visit: Payer: Self-pay

## 2022-01-31 ENCOUNTER — Ambulatory Visit (INDEPENDENT_AMBULATORY_CARE_PROVIDER_SITE_OTHER): Payer: Medicare HMO

## 2022-01-31 ENCOUNTER — Encounter: Payer: Self-pay | Admitting: Internal Medicine

## 2022-01-31 VITALS — BP 140/84 | HR 62 | Temp 98.1°F | Ht 65.0 in | Wt 199.2 lb

## 2022-01-31 DIAGNOSIS — R296 Repeated falls: Secondary | ICD-10-CM

## 2022-01-31 DIAGNOSIS — R2689 Other abnormalities of gait and mobility: Secondary | ICD-10-CM

## 2022-01-31 DIAGNOSIS — M4312 Spondylolisthesis, cervical region: Secondary | ICD-10-CM | POA: Diagnosis not present

## 2022-01-31 DIAGNOSIS — I6523 Occlusion and stenosis of bilateral carotid arteries: Secondary | ICD-10-CM | POA: Insufficient documentation

## 2022-01-31 DIAGNOSIS — M542 Cervicalgia: Secondary | ICD-10-CM

## 2022-01-31 DIAGNOSIS — M47812 Spondylosis without myelopathy or radiculopathy, cervical region: Secondary | ICD-10-CM | POA: Diagnosis not present

## 2022-01-31 DIAGNOSIS — R0789 Other chest pain: Secondary | ICD-10-CM | POA: Diagnosis not present

## 2022-01-31 DIAGNOSIS — G8929 Other chronic pain: Secondary | ICD-10-CM

## 2022-01-31 DIAGNOSIS — H547 Unspecified visual loss: Secondary | ICD-10-CM | POA: Diagnosis not present

## 2022-01-31 DIAGNOSIS — M5020 Other cervical disc displacement, unspecified cervical region: Secondary | ICD-10-CM | POA: Diagnosis not present

## 2022-01-31 DIAGNOSIS — R269 Unspecified abnormalities of gait and mobility: Secondary | ICD-10-CM

## 2022-01-31 DIAGNOSIS — R519 Headache, unspecified: Secondary | ICD-10-CM | POA: Diagnosis not present

## 2022-01-31 DIAGNOSIS — W19XXXS Unspecified fall, sequela: Secondary | ICD-10-CM

## 2022-01-31 LAB — CBC WITH DIFFERENTIAL/PLATELET
Basophils Absolute: 0 10*3/uL (ref 0.0–0.1)
Basophils Relative: 0.8 % (ref 0.0–3.0)
Eosinophils Absolute: 0.3 10*3/uL (ref 0.0–0.7)
Eosinophils Relative: 5.4 % — ABNORMAL HIGH (ref 0.0–5.0)
HCT: 38.3 % (ref 36.0–46.0)
Hemoglobin: 12.9 g/dL (ref 12.0–15.0)
Lymphocytes Relative: 32.9 % (ref 12.0–46.0)
Lymphs Abs: 1.5 10*3/uL (ref 0.7–4.0)
MCHC: 33.8 g/dL (ref 30.0–36.0)
MCV: 94.5 fl (ref 78.0–100.0)
Monocytes Absolute: 0.4 10*3/uL (ref 0.1–1.0)
Monocytes Relative: 9.6 % (ref 3.0–12.0)
Neutro Abs: 2.4 10*3/uL (ref 1.4–7.7)
Neutrophils Relative %: 51.3 % (ref 43.0–77.0)
Platelets: 182 10*3/uL (ref 150.0–400.0)
RBC: 4.05 Mil/uL (ref 3.87–5.11)
RDW: 14.2 % (ref 11.5–15.5)
WBC: 4.7 10*3/uL (ref 4.0–10.5)

## 2022-01-31 LAB — BASIC METABOLIC PANEL WITH GFR
BUN: 19 mg/dL (ref 6–23)
CO2: 27 meq/L (ref 19–32)
Calcium: 10.3 mg/dL (ref 8.4–10.5)
Chloride: 106 meq/L (ref 96–112)
Creatinine, Ser: 0.79 mg/dL (ref 0.40–1.20)
GFR: 72.5 mL/min
Glucose, Bld: 107 mg/dL — ABNORMAL HIGH (ref 70–99)
Potassium: 3.8 meq/L (ref 3.5–5.1)
Sodium: 143 meq/L (ref 135–145)

## 2022-01-31 LAB — TROPONIN I (HIGH SENSITIVITY): High Sens Troponin I: 10 ng/L (ref 2–17)

## 2022-01-31 NOTE — Patient Instructions (Signed)
 Neck Exercises Ask your health care provider which exercises are safe for you. Do exercises exactly as told by your health care provider and adjust them as directed. It is normal to feel mild stretching, pulling, tightness, or discomfort as you do these exercises. Stop right away if you feel sudden pain or your pain gets worse. Do not begin these exercises until told by your health care provider. Neck exercises can be important for many reasons. They can improve strength and maintain flexibility in your neck, which will help your upper back and prevent neck pain. Stretching exercises Rotation neck stretching  Sit in a chair or stand up. Place your feet flat on the floor, shoulder-width apart. Slowly turn your head (rotate) to the right until a slight stretch is felt. Turn it all the way to the right so you can look over your right shoulder. Do not tilt or tip your head. Hold this position for 10-30 seconds. Slowly turn your head (rotate) to the left until a slight stretch is felt. Turn it all the way to the left so you can look over your left shoulder. Do not tilt or tip your head. Hold this position for 10-30 seconds. Repeat __________ times. Complete this exercise __________ times a day. Neck retraction  Sit in a sturdy chair or stand up. Look straight ahead. Do not bend your neck. Use your fingers to push your chin backward (retraction). Do not bend your neck for this movement. Continue to face straight ahead. If you are doing the exercise properly, you will feel a slight sensation in your throat and a stretch at the back of your neck. Hold the stretch for 1-2 seconds. Repeat __________ times. Complete this exercise __________ times a day. Strengthening exercises Neck press  Lie on your back on a firm bed or on the floor with a pillow under your head. Use your neck muscles to push your head down on the pillow and straighten your spine. Hold the position as well as you can. Keep your head  facing up (in a neutral position) and your chin tucked. Slowly count to 5 while holding this position. Repeat __________ times. Complete this exercise __________ times a day. Isometrics These are exercises in which you strengthen the muscles in your neck while keeping your neck still (isometrics). Sit in a supportive chair and place your hand on your forehead. Keep your head and face facing straight ahead. Do not flex or extend your neck while doing isometrics. Push forward with your head and neck while pushing back with your hand. Hold for 10 seconds. Do the sequence again, this time putting your hand against the back of your head. Use your head and neck to push backward against the hand pressure. Finally, do the same exercise on either side of your head, pushing sideways against the pressure of your hand. Repeat __________ times. Complete this exercise __________ times a day. Prone head lifts  Lie face-down (prone position), resting on your elbows so that your chest and upper back are raised. Start with your head facing downward, near your chest. Position your chin either on or near your chest. Slowly lift your head upward. Lift until you are looking straight ahead. Then continue lifting your head as far back as you can comfortably stretch. Hold your head up for 5 seconds. Then slowly lower it to your starting position. Repeat __________ times. Complete this exercise __________ times a day. Supine head lifts  Lie on your back (supine position), bending your knees  to point to the ceiling and keeping your feet flat on the floor. Lift your head slowly off the floor, raising your chin toward your chest. Hold for 5 seconds. Repeat __________ times. Complete this exercise __________ times a day. Scapular retraction  Stand with your arms at your sides. Look straight ahead. Slowly pull both shoulders (scapulae) backward and downward (retraction) until you feel a stretch between your shoulder  blades in your upper back. Hold for 10-30 seconds. Relax and repeat. Repeat __________ times. Complete this exercise __________ times a day. Contact a health care provider if: Your neck pain or discomfort gets worse when you do an exercise. Your neck pain or discomfort does not improve within 2 hours after you exercise. If you have any of these problems, stop exercising right away. Do not do the exercises again unless your health care provider says that you can. Get help right away if: You develop sudden, severe neck pain. If this happens, stop exercising right away. Do not do the exercises again unless your health care provider says that you can. This information is not intended to replace advice given to you by your health care provider. Make sure you discuss any questions you have with your health care provider. Document Revised: 05/02/2021 Document Reviewed: 05/02/2021 Elsevier Patient Education  2022 Else[employer] Inc[employer]  General Headache Without Cause A headache is pain or discomfort felt around the head or neck area. There are many causes and types of headaches. A few common types include: Tension headaches. Migraine headaches. Cluster headaches. Chronic daily headaches. Sometimes, the specific cause of a headache may not be found. Follow these instructions at home: Watch your condition for any changes. Let your health care provider know about them. Take these steps to help with your condition: Managing pain   Take over-the-counter and prescription medicines only as told by your health care provider. Treatment may include medicines for pain that are taken by mouth or applied to the skin. Lie down in a dark, quiet room when you have a headache. Keep lights dim if bright lights bother you or make your headaches worse. If directed, put ice on your head and neck area: Put ice in a plastic bag. Place a towel between your skin and the bag. Leave the ice on for 20 minutes, 2-3 times per  day. Remove the ice if your skin turns bright red. This is very important. If you cannot feel pain, heat, or cold, you have a greater risk of damage to the area. If directed, apply heat to the affected area. Use the heat source that your health care provider recommends, such as a moist heat pack or a heating pad. Place a towel between your skin and the heat source. Leave the heat on for 20-30 minutes. Remove the heat if your skin turns bright red. This is especially important if you are unable to feel pain, heat, or cold. You have a greater risk of getting burned. Eating and drinking Eat meals on a regular schedule. If you drink alcohol : Limit how much you have to: 0-1 drink a day for women who are not pregnant. 0-2 drinks a day for men. Know how much alcohol  is in a drink. In the [place] one drink equals one 12 oz bottle of beer (355 mL), one 5 oz glass of wine (148 mL), or one 1 oz glass of hard liquor (44 mL). Stop drinking caffeine, or decrease the amount of caffeine you drink. Drink enough fluid to keep your urine pale  yellow. General instructions  Keep a headache journal to help find out what may trigger your headaches. For example, write down: What you eat and drink. How much sleep you get. Any change to your diet or medicines. Try massage or other relaxation techniques. Limit stress. Sit up straight, and do not tense your muscles. Do not use any products that contain nicotine or tobacco. These products include cigarettes, chewing tobacco, and vaping devices, such as e-cigarettes. If you need help quitting, ask your health care provider. Exercise regularly as told by your health care provider. Sleep on a regular schedule. Get 7-9 hours of sleep each night, or the amount recommended by your health care provider. Keep all follow-up visits. This is important. Contact a health care provider if: Medicine does not help your symptoms. You have a headache that is different from your usual  headache. You have nausea or you vomit. You have a fever. Get help right away if: Your headache: Becomes severe quickly. Gets worse after moderate to intense physical activity. You have any of these symptoms: Repeated vomiting. Pain or stiffness in your neck. Changes to your vision. Pain in an eye or ear. Problems with speech. Muscular weakness or loss of muscle control. Loss of balance or coordination. You feel faint or pass out. You have confusion. You have a seizure. These symptoms may represent a serious problem that is an emergency. Do not wait to see if the symptoms will go away. Get medical help right away. Call your local emergency services (911 in the [place] Do not drive yourself to the hospital. Summary A headache is pain or discomfort felt around the head or neck area. There are many causes and types of headaches. In some cases, the cause may not be found. Keep a headache journal to help find out what may trigger your headaches. Watch your condition for any changes. Let your health care provider know about them. Contact a health care provider if you have a headache that is different from the usual headache, or if your symptoms are not helped by medicine. Get help right away if your headache becomes severe, you vomit, you have a loss of vision, you lose your balance, or you have a seizure. This information is not intended to replace advice given to you by your health care provider. Make sure you discuss any questions you have with your health care provider. Document Revised: 04/05/2021 Document Reviewed: 04/05/2021 Elsevier Patient Education  2022 Elsevier Inc.  Cervicogenic Headache In a cervicogenic headache, the pain moves from your neck to your head. Most cervicogenic headaches start in the upper part of the neck with the first three cervical bones (cervical vertebrae). What are the causes? The most common cause of this condition is a traumatic injury to the bones and  tissues in your neck (cervical spine). Whiplash is an example of a cervical spine injury. Other causes include: Arthritis. Broken bone (fracture). Infection. Tumor. What are the signs or symptoms? The most common symptoms are neck and head pain. The pain is often located on one side. In some cases, there may be head pain without neck pain. Pain may be felt in the neck, back or side of the head, face, or behind the eyes. Other symptoms include: Limited movement in the neck. Arm or shoulder pain. How is this diagnosed? This condition may be diagnosed based on: Your symptoms. A physical exam. An injection that blocks nerve signals (diagnostic nerve block). Imaging tests, such as: X-rays. CT scan. MRI. A  cervicogenic headache is diagnosed when a cause can be found in the cervical spine and other causes of headaches can be ruled out. How is this treated? Treatment for this condition may depend on the underlying condition. Treatment may include: Medicines, such as: NSAIDs, such as ibuprofen. Muscle relaxants. Physical therapy. Massage therapy. Complementary therapies, such as: Biofeedback. Meditation. Acupuncture. Nerve block injections to reduce the pain. Botulinum toxin injections. Your treatment plan may involve working with a pain management team that includes your primary health care provider, a pain management specialist, a neurologist, and a physical therapist. Follow these instructions at home: Take over-the-counter and prescription medicines only as told by your health care provider. Do exercises at home as told by your physical therapist. Return to your normal activities as told by your health care provider. Ask your health care provider what activities are safe for you. Avoid activities that trigger your headaches. Maintain good neck support and posture at home and at work. Keep all follow-up visits. This is important. Contact a health care provider if: You have  headaches that are getting worse and happening more often. You have headaches with any of the following: Fever. Numbness. Weakness. Dizziness. Nausea or vomiting. Get help right away if: You have a sudden and severe headache. This symptom may be an emergency. Get help right away. Call 911. Do not wait to see if this symptom will go away. Do not drive yourself to the hospital. Summary A cervicogenic headache is a headache caused by a condition that affects the bones and tissues in your cervical spine. Your health care provider may diagnose this condition with a physical exam, a diagnostic nerve block, and imaging tests. Treatment may include medicine to reduce pain and inflammation, physical therapy, and nerve block injections. Complementary therapies, such as acupuncture and meditation, may be added to other treatments. Your treatment plan may involve working with a pain management team that includes your primary health care provider, a pain management specialist, a neurologist, and a physical therapist. This information is not intended to replace advice given to you by your health care provider. Make sure you discuss any questions you have with your health care provider. Document Revised: 05/11/2021 Document Reviewed: 05/11/2021 Elsevier Patient Education  2022 [employer].

## 2022-01-31 NOTE — Telephone Encounter (Signed)
Lft pt vm to call ofc to sch STAT MRI. thanks ?

## 2022-01-31 NOTE — Progress Notes (Signed)
 Chief Complaint  Patient presents with   Headache   F/u with husband  H/o right sided h/a over right eye x several months no known injuries but h/o fall and abnormal gait mechanical fall. Yesterday am and Monday severe right eye pain felt like explosion of pain and everything both sides of eye went black 01/10 pain today pain 2/10 was 9/10 worse at night and at times constant tries tylenol  arthritis 2 am and  2 pm no n/v. She also describes pain like a transformer blew up in brain and sparks coming out and she was in total darkness for a few seconds Monday am 6-6:30 am. She saw eye md 11/2021  No h/o h/a or migraines  US  carotid in 2018 1-39% CAS She has been off balance, no weakness, no numbness/tingling    1 week ago left sided chest pain below breast and drank carbonated beverage and resolved the neck day gone today   She is having neck pain posterior and h/o mid and low back pain s/p neurosurgery with [facility] tries tylenol  arthritis 2 in am and pm    Review of Systems  Constitutional:  Negative for weight loss.  HENT:  Negative for hearing loss.   Eyes:  Negative for blurred vision.       Loss of vision  Respiratory:  Negative for shortness of breath.   Cardiovascular:  Negative for chest pain and palpitations.  Gastrointestinal:  Negative for abdominal pain and blood in stool.  Genitourinary:  Negative for dysuria.  Musculoskeletal:  Positive for falls and neck pain. Negative for joint pain.  Skin:  Negative for rash.  Neurological:  Positive for headaches. Negative for dizziness and weakness.  Psychiatric/Behavioral:  Negative for depression.   Past Medical History:  Diagnosis Date   Allergy    hay fever   Anxiety    Arthritis    Chronic back pain    Diabetes mellitus    Dyspnea    a. 07/2017 Echo: EF 60-65%. No rwma. Mildly dil LA. Nl RV fxn.   GERD (gastroesophageal reflux disease)    History of hiatal hernia    Hyperlipidemia    Hypertension     Osteoarthritis    Pneumonia    Spinal stenosis    Type 2 diabetes mellitus (HCC)    UTI (lower urinary tract infection)    Past Surgical History:  Procedure Laterality Date   ABDOMINAL HYSTERECTOMY  1973   ABDOMINAL HYSTERECTOMY     APPENDECTOMY     APPLICATION OF ROBOTIC ASSISTANCE FOR SPINAL PROCEDURE N/A 09/15/2018   Procedure: APPLICATION OF ROBOTIC ASSISTANCE FOR SPINAL PROCEDURE;  Surgeon: Colon Shove, MD;  Location: [facility] OR;  Service: Neurosurgery;  Laterality: N/A;   BACK SURGERY  04/17/13, 2017, 2019   CATARACT EXTRACTION W/PHACO Left 12/16/2019   Procedure: CATARACT EXTRACTION PHACO AND INTRAOCULAR LENS PLACEMENT (IOC) LEFT PANOPTIX LENS DIABETIC 8.07 01:19.2 10.2%;  Surgeon: Mittie Gaskin, MD;  Location: [facility];  Service: Ophthalmology;  Laterality: Left;  Diabetic - oral meds   CATARACT EXTRACTION W/PHACO Right 01/06/2020   Procedure: CATARACT EXTRACTION PHACO AND INTRAOCULAR LENS PLACEMENT (IOC) PANOPTIX LENS RIGHT DIABETIC 10.73 01:08.6 15.7%;  Surgeon: Mittie Gaskin, MD;  Location: [facility];  Service: Ophthalmology;  Laterality: Right;  diabetic   CHOLECYSTECTOMY  1995   COLONOSCOPY WITH PROPOFOL  N/A 03/15/2021   Procedure: COLONOSCOPY WITH PROPOFOL ;  Surgeon: Dessa Reyes ORN, MD;  Location: [facility];  Service: Endoscopy;  Laterality: N/A;   EYE SURGERY  2021   bilateral cataract removal with lens implants   HERNIA REPAIR  08/2006   lap band  08/2003   LUMBAR LAMINECTOMY/ DECOMPRESSION WITH MET-RX Left 12/29/2020   Procedure: Left Thoracic Seven-Eight Microdiscectomy;  Surgeon: Colon Shove, MD;  Location: [facility] OR;  Service: Neurosurgery;  Laterality: Left;   SEPTOPLASTY  04/1978   TONSILLECTOMY AND ADENOIDECTOMY  1979   TOTAL HIP ARTHROPLASTY  02/2012   Right, Dr. Mardee   TOTAL HIP ARTHROPLASTY Left 08/31/2019   Procedure: TOTAL HIP ARTHROPLASTY;  Surgeon: Mardee Lynwood SQUIBB, MD;  Location: [facility];  Service: Orthopedics;   Laterality: Left;   Family History  Problem Relation Age of Onset   Hypertension Mother    Stroke Mother    Heart disease Mother    Hypertension Father    Stroke Father    Heart disease Father    Arthritis Sister    Diabetes Sister    Cancer Sister    Diabetes Brother    Hypertension Brother    Dementia Brother    Diabetes Brother    Hypertension Brother    Dementia Brother    Breast cancer Other 54   Cancer Other        breast   Diabetes Other    Kidney disease Neg Hx    Bladder Cancer Neg Hx    Social History   Socioeconomic History   Marital status: Married    Spouse name: Not on file   Number of children: Not on file   Years of education: Not on file   Highest education level: Not on file  Occupational History   Not on file  Tobacco Use   Smoking status: Never   Smokeless tobacco: Never  Vaping Use   Vaping Use: Never used  Substance and Sexual Activity   Alcohol  use: Yes    Alcohol /week: 0.0 - 1.0 standard drinks    Comment: RARELY   Drug use: No   Sexual activity: Not Currently  Other Topics Concern   Not on file  Social History Narrative   Lives in [place] with husband.      Work - retired Curator of Corporate Investment Banker Strain: Low Risk    Difficulty of Paying Living Expenses: Not hard at [employer] Insecurity: No Food Insecurity   Worried About Programme Researcher, Broadcasting/film/video in the Last Year: Never true   Barista in the Last Year: Never true  Transportation Needs: No Regulatory Affairs Officer (Medical): No   Lack of Transportation (Non-Medical): No  Physical Activity: Unknown   Days of Exercise per Week: 0 days   Minutes of Exercise per Session: Not on file  Stress: No Stress Concern Present   Feeling of Stress : Not at all  Social Connections: Unknown   Frequency of Communication with Friends and Family: Not on file   Frequency of Social Gatherings with Friends and Family: Not on  file   Attends Religious Services: Not on Scientist, Clinical (histocompatibility And Immunogenetics) or Organizations: Not on file   Attends Banker Meetings: Not on file   Marital Status: Married  Catering Manager Violence: Not At Risk   Fear of Current or Ex-Partner: No   Emotionally Abused: No   Physically Abused: No   Sexually Abused: No   Current Meds  Medication Sig   acetaminophen  (TYLENOL ) 650 MG CR tablet Take 1,950 mg by mouth  in the morning and at bedtime.   ALPRAZolam  (XANAX ) 0.25 MG tablet Take 1 tablet (0.25 mg total) by mouth daily as needed for anxiety.   azelastine  (ASTELIN ) 0.1 % nasal spray Place 1 spray into both nostrils 2 (two) times daily. Use in each nostril as directed   baclofen  (LIORESAL ) 10 MG tablet Take 10 mg by mouth 3 (three) times daily.   cholecalciferol  (VITAMIN D ) 25 MCG (1000 UNIT) tablet Take 1,000 Units by mouth daily.   Ferrous Sulfate  (IRON) 325 (65 Fe) MG TABS Take 325 mg by mouth daily.   fexofenadine  (ALLEGRA ) 180 MG tablet Take 1 tablet (180 mg total) by mouth daily.   fluticasone  (FLONASE ) 50 MCG/ACT nasal spray SPRAY 2 SPRAYS INTO EACH NOSTRIL EVERY DAY   losartan -hydrochlorothiazide  (HYZAAR) 50-12.5 MG tablet TAKE 1 TABLET BY MOUTH EVERY DAY   metFORMIN  (GLUCOPHAGE ) 500 MG tablet TAKE 1 TABLET BY MOUTH EVERY DAY   Multiple Vitamin (MULTIVITAMIN WITH MINERALS) TABS tablet Take 1 tablet by mouth daily.   OneTouch Delica Lancets 30G MISC Use to check blood sugar x per day. DX E11.9.   ONETOUCH VERIO test strip USE TO CHECK BLOOD SUGAR TWICE A DAY   pantoprazole  (PROTONIX ) 40 MG tablet TAKE 1 TABLET (40 MG TOTAL) BY MOUTH 2 (TWO) TIMES DAILY BEFORE A MEAL.   sertraline  (ZOLOFT ) 100 MG tablet TAKE 1/2 TABLET BY MOUTH (50 MG) BY MOUTH DAILY   simvastatin  (ZOCOR ) 10 MG tablet TAKE 1/2 TABLET (5 MG TOTAL) BY MOUTH AT BEDTIME.   traZODone  (DESYREL ) 100 MG tablet TAKE 1 TABLET BY MOUTH EVERYDAY AT BEDTIME   Allergies  Allergen Reactions   Adhesive [Tape] Itching     Honeycomb bandage - redness/itching   Recent Results (from the past 2160 hour(s))  HM DIABETES EYE EXAM     Status: None   Collection Time: 11/28/21 12:00 AM  Result Value Ref Range   HM Diabetic Eye Exam No Retinopathy No Retinopathy    Comment: [facility]   Objective  Body mass index is 33.15 kg/m. Wt Readings from Last 3 Encounters:  01/31/22 199 lb 3.2 oz (90.4 kg)  01/10/22 196 lb (88.9 kg)  11/24/21 193 lb (87.5 kg)   Temp Readings from Last 3 Encounters:  01/31/22 98.1 F (36.7 C) (Oral)  01/10/22 98.5 F (36.9 C) (Oral)  10/30/21 97.6 F (36.4 C)   BP Readings from Last 3 Encounters:  01/31/22 140/84  01/16/22 128/78  10/30/21 126/70   Pulse Readings from Last 3 Encounters:  01/31/22 62  01/10/22 61  10/30/21 69    Physical Exam Vitals and nursing note reviewed.  Constitutional:      Appearance: Normal appearance. She is well-developed and well-groomed. She is obese.  HENT:     Head: Normocephalic and atraumatic.  Eyes:     Conjunctiva/sclera: Conjunctivae normal.     Pupils: Pupils are equal, round, and reactive to light.  Neck:     Vascular: No carotid bruit.  Cardiovascular:     Rate and Rhythm: Normal rate and regular rhythm.     Heart sounds: Normal heart sounds. No murmur heard. Pulmonary:     Effort: Pulmonary effort is normal.     Breath sounds: Normal breath sounds.  Abdominal:     General: Abdomen is flat. Bowel sounds are normal.     Tenderness: There is no abdominal tenderness.  Musculoskeletal:        General: No tenderness.  Skin:    General: Skin is  warm and dry.  Neurological:     General: No focal deficit present.     Mental Status: She is alert and oriented to person, place, and time. Mental status is at baseline.     Cranial Nerves: Cranial nerves 2-12 are intact.     Sensory: Sensory deficit present.     Motor: Motor function is intact.     Coordination: Coordination is intact.     Gait: Gait is intact.      Comments: Less sensation V1 right face on neuro exam sensation normal in upper arms and lower legs   Slow gait due to back Normal strength 5/5 upper and lower ext b/l   Psychiatric:        Attention and Perception: Attention and perception normal.        Mood and Affect: Mood and affect normal.        Speech: Speech normal.        Behavior: Behavior normal. Behavior is cooperative.        Thought Content: Thought content normal.        Cognition and Memory: Cognition and memory normal.        Judgment: Judgment normal.    Assessment  Plan  Chronic intractable headache, and loss of vision b/l eyes with h/o 1-39% b/l carotid stenosis on US  in 2018 r/o stroke/TIA vs other. Reduced sensation V1 right face and balance problems and fall x 1- Plan: Troponin I (High Sensitivity), Basic Metabolic Panel (BMET), CBC w/Diff, MR Brain Wo Contrast, US  Carotid Duplex Bilateral,  Consider neurology in the future Consider PT/OT in the future  Atypical chest pain - Plan: Troponin I (High Sensitivity), Basic Metabolic Panel (BMET), CBC w/Diff Est with Dr. Gollan if sx's continue   Cervicalgia - Plan: DG Cervical Spine Complete  F/u cheron NS if needed   Provider: Dr. Randine McLean-Scocuzza-Internal Medicine

## 2022-01-31 NOTE — Addendum Note (Signed)
Addended by: Orland Mustard on: 01/31/2022 11:32 AM ? ? Modules accepted: Orders ? ?

## 2022-02-01 ENCOUNTER — Ambulatory Visit
Admission: RE | Admit: 2022-02-01 | Discharge: 2022-02-01 | Disposition: A | Discharge status: Home / Self Care | Payer: Medicare HMO | Source: Ambulatory Visit | Attending: Internal Medicine | Admitting: Internal Medicine

## 2022-02-01 DIAGNOSIS — R519 Headache, unspecified: Secondary | ICD-10-CM

## 2022-02-01 DIAGNOSIS — I6523 Occlusion and stenosis of bilateral carotid arteries: Secondary | ICD-10-CM

## 2022-02-01 DIAGNOSIS — H547 Unspecified visual loss: Secondary | ICD-10-CM

## 2022-02-03 ENCOUNTER — Other Ambulatory Visit: Payer: Self-pay | Admitting: Internal Medicine

## 2022-02-06 DIAGNOSIS — J301 Allergic rhinitis due to pollen: Secondary | ICD-10-CM | POA: Diagnosis not present

## 2022-02-07 ENCOUNTER — Ambulatory Visit
Admission: RE | Admit: 2022-02-07 | Discharge: 2022-02-07 | Disposition: A | Discharge status: Home / Self Care | Payer: Medicare HMO | Source: Ambulatory Visit | Attending: Internal Medicine | Admitting: Internal Medicine

## 2022-02-07 DIAGNOSIS — I6523 Occlusion and stenosis of bilateral carotid arteries: Secondary | ICD-10-CM

## 2022-02-07 DIAGNOSIS — H547 Unspecified visual loss: Secondary | ICD-10-CM

## 2022-02-07 DIAGNOSIS — R519 Headache, unspecified: Secondary | ICD-10-CM

## 2022-02-07 DIAGNOSIS — E785 Hyperlipidemia, unspecified: Secondary | ICD-10-CM | POA: Diagnosis not present

## 2022-02-07 DIAGNOSIS — H539 Unspecified visual disturbance: Secondary | ICD-10-CM | POA: Diagnosis not present

## 2022-02-07 DIAGNOSIS — I10 Essential (primary) hypertension: Secondary | ICD-10-CM | POA: Diagnosis not present

## 2022-02-07 DIAGNOSIS — E119 Type 2 diabetes mellitus without complications: Secondary | ICD-10-CM | POA: Diagnosis not present

## 2022-02-09 NOTE — Addendum Note (Signed)
Addended by: Orland Mustard on: 02/09/2022 04:30 PM ? ? Modules accepted: Orders ? ?

## 2022-02-13 ENCOUNTER — Other Ambulatory Visit: Payer: Self-pay | Admitting: Internal Medicine

## 2022-02-16 DIAGNOSIS — J301 Allergic rhinitis due to pollen: Secondary | ICD-10-CM | POA: Diagnosis not present

## 2022-02-20 DIAGNOSIS — J301 Allergic rhinitis due to pollen: Secondary | ICD-10-CM | POA: Diagnosis not present

## 2022-02-27 DIAGNOSIS — J301 Allergic rhinitis due to pollen: Secondary | ICD-10-CM | POA: Diagnosis not present

## 2022-03-03 ENCOUNTER — Other Ambulatory Visit: Payer: Self-pay | Admitting: Internal Medicine

## 2022-03-06 DIAGNOSIS — R9082 White matter disease, unspecified: Secondary | ICD-10-CM | POA: Diagnosis not present

## 2022-03-06 DIAGNOSIS — Z8616 Personal history of COVID-19: Secondary | ICD-10-CM | POA: Diagnosis not present

## 2022-03-06 DIAGNOSIS — J301 Allergic rhinitis due to pollen: Secondary | ICD-10-CM | POA: Diagnosis not present

## 2022-03-06 DIAGNOSIS — G4750 Parasomnia, unspecified: Secondary | ICD-10-CM | POA: Diagnosis not present

## 2022-03-09 ENCOUNTER — Ambulatory Visit (INDEPENDENT_AMBULATORY_CARE_PROVIDER_SITE_OTHER): Payer: Medicare HMO

## 2022-03-09 VITALS — Ht 65.0 in | Wt 199.0 lb

## 2022-03-09 DIAGNOSIS — Z0000 Encounter for general adult medical examination without abnormal findings: Secondary | ICD-10-CM | POA: Diagnosis not present

## 2022-03-09 NOTE — Patient Instructions (Addendum)
?  [name] , ?Thank you for taking time to come for your Medicare Wellness Visit. I appreciate your ongoing commitment to your health goals. Please review the following plan we discussed and let me know if I can assist you in the future.  ? ?These are the goals we discussed: ? Goals   ? ?  ? Patient Stated  ?   Increase physical activity (pt-stated)   ?   Walk more for exercise.  ?Stretch! ?Lose weight. ? ? ?  ? ?  ?  ?This is a list of the screening recommended for you and due dates:  ?Health Maintenance  ?Topic Date Due  ? Complete foot exam   03/19/2022*  ? Hemoglobin A1C  04/27/2022  ? Flu Shot  06/19/2022  ? Mammogram  11/09/2022  ? Eye exam for diabetics  11/28/2022  ? Tetanus Vaccine  04/19/2029  ? Pneumonia Vaccine  Completed  ? DEXA scan (bone density measurement)  Completed  ? COVID-19 Vaccine  Completed  ? Hepatitis C Screening: USPSTF Recommendation to screen - Ages 84-79 yo.  Completed  ? Zoster (Shingles) Vaccine  Completed  ? HPV Vaccine  Aged Out  ? Colon Cancer Screening  Discontinued  ?*Topic was postponed. The date shown is not the original due date.  ?  ?

## 2022-03-09 NOTE — Progress Notes (Signed)
Subjective:   Erin Camacho is a 76 y.o. female who presents for Medicare Annual (Subsequent) preventive examination.  Review of Systems    No ROS.  Medicare Wellness Virtual Visit.  Visual/audio telehealth visit, UTA vital signs.   See social history for additional risk factors.   Cardiac Risk Factors include: advanced age (>54men, >53 women);diabetes mellitus;hypertension     Objective:    Today's Vitals   03/09/22 1319  Weight: 199 lb (90.3 kg)  Height: 5\' 5"  (1.651 m)   Body mass index is 33.12 kg/m.     03/09/2022    1:32 PM 07/06/2021    2:03 PM 03/15/2021    8:05 AM 03/08/2021    2:30 PM 12/30/2020    8:00 AM 12/29/2020   12:04 PM 11/28/2020    3:11 PM  Advanced Directives  Does Patient Have a Medical Advance Directive? Yes No No Yes Yes Yes Yes  Type of Estate agent of [place];Living will   Healthcare Power of [place];Living will Living will;Healthcare Power of Attorney Living will;Healthcare Power of Attorney Living will  Does patient want to make changes to medical advance directive? No - Patient declined   No - Patient declined No - Patient declined  No - Patient declined  Copy of Healthcare Power of Attorney in Chart? Yes - validated most recent copy scanned in chart (See row information)  No - copy requested Yes - validated most recent copy scanned in chart (See row information) Yes - validated most recent copy scanned in chart (See row information) Yes - validated most recent copy scanned in chart (See row information)   Would patient like information on creating a medical advance directive?  No - Patient declined No - Patient declined       Current Medications (verified) Outpatient Encounter Medications as of 03/09/2022  Medication Sig   acetaminophen (TYLENOL) 650 MG CR tablet Take 1,950 mg by mouth in the morning and at bedtime.   ALPRAZolam (XANAX) 0.25 MG tablet Take 1 tablet (0.25 mg total) by mouth daily as needed for anxiety.    Azelastine HCl 137 MCG/SPRAY SOLN PLACE 1 SPRAY INTO BOTH NOSTRILS 2 (TWO) TIMES DAILY. USE IN EACH NOSTRIL AS DIRECTED   baclofen (LIORESAL) 10 MG tablet Take 10 mg by mouth 3 (three) times daily.   cholecalciferol (VITAMIN D) 25 MCG (1000 UNIT) tablet Take 1,000 Units by mouth daily.   Ferrous Sulfate (IRON) 325 (65 Fe) MG TABS Take 325 mg by mouth daily.   fexofenadine (ALLEGRA) 180 MG tablet Take 1 tablet (180 mg total) by mouth daily.   fluticasone (FLONASE) 50 MCG/ACT nasal spray SPRAY 2 SPRAYS INTO EACH NOSTRIL EVERY DAY   losartan-hydrochlorothiazide (HYZAAR) 50-12.5 MG tablet TAKE 1 TABLET BY MOUTH EVERY DAY   metFORMIN (GLUCOPHAGE) 500 MG tablet TAKE 1 TABLET BY MOUTH EVERY DAY   Multiple Vitamin (MULTIVITAMIN WITH MINERALS) TABS tablet Take 1 tablet by mouth daily.   OneTouch Delica Lancets 30G MISC Use to check blood sugar x per day. DX E11.9.   ONETOUCH VERIO test strip USE TO CHECK BLOOD SUGAR TWICE A DAY   pantoprazole (PROTONIX) 40 MG tablet TAKE 1 TABLET (40 MG TOTAL) BY MOUTH 2 (TWO) TIMES DAILY BEFORE A MEAL.   sertraline (ZOLOFT) 100 MG tablet TAKE 1/2 TABLET BY MOUTH (50 MG) BY MOUTH DAILY   simvastatin (ZOCOR) 10 MG tablet TAKE 1/2 TABLET (5 MG TOTAL) BY MOUTH AT BEDTIME.   traMADol (ULTRAM) 50 MG tablet Take  50 mg by mouth every 12 (twelve) hours as needed. (Patient not taking: Reported on 01/31/2022)   traZODone (DESYREL) 100 MG tablet TAKE 1 TABLET BY MOUTH EVERYDAY AT BEDTIME   No facility-administered encounter medications on file as of 03/09/2022.    Allergies (verified) Adhesive [tape]   History: Past Medical History:  Diagnosis Date   Allergy    hay fever   Anxiety    Arthritis    Chronic back pain    Diabetes mellitus    Dyspnea    a. 07/2017 Echo: EF 60-65%. No rwma. Mildly dil LA. Nl RV fxn.   GERD (gastroesophageal reflux disease)    History of hiatal hernia    Hyperlipidemia    Hypertension    Osteoarthritis    Pneumonia    Spinal stenosis     Type 2 diabetes mellitus (HCC)    UTI (lower urinary tract infection)    Past Surgical History:  Procedure Laterality Date   ABDOMINAL HYSTERECTOMY  1973   ABDOMINAL HYSTERECTOMY     APPENDECTOMY     APPLICATION OF ROBOTIC ASSISTANCE FOR SPINAL PROCEDURE N/A 09/15/2018   Procedure: APPLICATION OF ROBOTIC ASSISTANCE FOR SPINAL PROCEDURE;  Surgeon: Barnett Abu, MD;  Location: [facility] OR;  Service: Neurosurgery;  Laterality: N/A;   BACK SURGERY  04/17/13, 2017, 2019   CATARACT EXTRACTION W/PHACO Left 12/16/2019   Procedure: CATARACT EXTRACTION PHACO AND INTRAOCULAR LENS PLACEMENT (IOC) LEFT PANOPTIX LENS DIABETIC 8.07 01:19.2 10.2%;  Surgeon: Lockie Mola, MD;  Location: [facility];  Service: Ophthalmology;  Laterality: Left;  Diabetic - oral meds   CATARACT EXTRACTION W/PHACO Right 01/06/2020   Procedure: CATARACT EXTRACTION PHACO AND INTRAOCULAR LENS PLACEMENT (IOC) PANOPTIX LENS RIGHT DIABETIC 10.73 01:08.6 15.7%;  Surgeon: Lockie Mola, MD;  Location: [facility];  Service: Ophthalmology;  Laterality: Right;  diabetic   CHOLECYSTECTOMY  1995   COLONOSCOPY WITH PROPOFOL N/A 03/15/2021   Procedure: COLONOSCOPY WITH PROPOFOL;  Surgeon: Earline Mayotte, MD;  Location: [facility] END[facility]COPY;  Service: Endoscopy;  Laterality: N/A;   EYE SURGERY  2021   bilateral cataract removal with lens implants   HERNIA REPAIR  08/2006   lap band  08/2003   LUMBAR LAMINECTOMY/ DECOMPRESSION WITH MET-RX Left 12/29/2020   Procedure: Left Thoracic Seven-Eight Microdiscectomy;  Surgeon: Barnett Abu, MD;  Location: [facility] OR;  Service: Neurosurgery;  Laterality: Left;   SEPTOPLASTY  04/1978   TONSILLECTOMY AND ADENOIDECTOMY  1979   TOTAL HIP ARTHROPLASTY  02/2012   Right, Dr. Ernest Pine   TOTAL HIP ARTHROPLASTY Left 08/31/2019   Procedure: TOTAL HIP ARTHROPLASTY;  Surgeon: Donato Heinz, MD;  Location: [facility] ORS;  Service: Orthopedics;  Laterality: Left;   Family History  Problem Relation Age  of Onset   Hypertension Mother    Stroke Mother    Heart disease Mother    Hypertension Father    Stroke Father    Heart disease Father    Arthritis Sister    Diabetes Sister    Cancer Sister    Diabetes Brother    Hypertension Brother    Dementia Brother    Diabetes Brother    Hypertension Brother    Dementia Brother    Breast cancer Other 51   Cancer Other        breast   Diabetes Other    Kidney disease Neg Hx    Bladder Cancer Neg Hx    Social History   Socioeconomic History   Marital status: Married  Spouse name: Not on file   Number of children: Not on file   Years of education: Not on file   Highest education level: Not on file  Occupational History   Not on file  Tobacco Use   Smoking status: Never   Smokeless tobacco: Never  Vaping Use   Vaping Use: Never used  Substance and Sexual Activity   Alcohol use: Yes    Alcohol/week: 0.0 - 1.0 standard drinks    Comment: RARELY   Drug use: No   Sexual activity: Not Currently  Other Topics Concern   Not on file  Social History Narrative   Lives in [place] with husband.      Work - retired Curator of Corporate investment banker Strain: Low Risk    Difficulty of Paying Living Expenses: Not hard at [employer] Insecurity: No Food Insecurity   Worried About Programme researcher, broadcasting/film/video in the Last Year: Never true   Barista in the Last Year: Never true  Transportation Needs: No Regulatory affairs officer (Medical): No   Lack of Transportation (Non-Medical): No  Physical Activity: Not on file  Stress: No Stress Concern Present   Feeling of Stress : Not at all  Social Connections: Unknown   Frequency of Communication with Friends and Family: Not on file   Frequency of Social Gatherings with Friends and Family: Not on file   Attends Religious Services: Not on file   Active Member of Clubs or Organizations: Not on file   Attends Banker  Meetings: Not on file   Marital Status: Married    Tobacco Counseling Counseling given: Not Answered   Clinical Intake:  Pre-visit preparation completed: Yes        Diabetes: No  How often do you need to have someone help you when you read instructions, pamphlets, or other written materials from your doctor or pharmacy?: 1 - Never   Interpreter Needed?: No      Activities of Daily Living    03/09/2022    1:36 PM  In your present state of health, do you have any difficulty performing the following activities:  Hearing? 0  Vision? 0  Difficulty concentrating or making decisions? 0  Walking or climbing stairs? 1  Comment Cane in use when ambulating  Dressing or bathing? 0  Doing errands, shopping? 0  Preparing Food and eating ? N  Comment Husband assist as needed with meal prep. Self feeds.  Using the Toilet? N  In the past six months, have you accidently leaked urine? N  Do you have problems with loss of bowel control? N  Managing your Medications? N  Managing your Finances? N  Housekeeping or managing your Housekeeping? Y  Comment Maid assist    Patient Care Team: Dale [place], MD as PCP - General (Internal Medicine) Antonieta Iba, MD as PCP - Cardiology (Cardiology)  Indicate any recent Medical Services you may have received from other than [facility] providers in the past year (date may be approximate).     Assessment:   This is a routine wellness examination for [facility].  Virtual Visit via Telephone Note  I connected with  Erin Camacho on 03/09/22 at  1:15 PM EDT by telephone and verified that I am speaking with the correct person using two identifiers.  Persons participating in the virtual visit: patient/Nurse Health Advisor   I discussed the limitations of performing  an evaluation and management service by telehealth. The patient expressed understanding and agreed to proceed. We continued and completed visit with audio only. Some vital signs may be  absent or patient reported.   Hearing/Vision screen Hearing Screening - Comments:: Patient is able to hear conversational tones without difficulty. No issues reported. Vision Screening - Comments:: Followed by [facility] Wears reading lenses Cataract extracted, bilateral  No retinopathy reported  Dietary issues and exercise activities discussed: Current Exercise Habits: Home exercise routine, Type of exercise: walking, Intensity: Mild Healthy diet Water intake (32 ounces)   Goals Addressed               This Visit's Progress     Patient Stated     Increase physical activity (pt-stated)        Walk more for exercise.  Stretch! Lose weight.        Other     COMPLETED: DIET - INCREASE WATER INTAKE        Place water/fluids in line of vision to help remind to drink.       Depression Screen    03/09/2022    1:29 PM 01/31/2022   11:32 AM 01/10/2022   11:11 AM 07/06/2021    2:05 PM 05/16/2021   10:41 AM 03/08/2021    2:27 PM 09/15/2020   10:31 AM  PHQ 2/9 Scores  PHQ - 2 Score 0 1 0 0 2 0 0  PHQ- 9 Score 0 4   5  0    Fall Risk    03/09/2022    1:33 PM 01/10/2022   11:11 AM 07/06/2021    2:04 PM 03/08/2021    2:36 PM 05/16/2020   11:31 AM  Fall Risk   Falls in the past year?  1 1 0 0  Number falls in past yr:   0 0 0  Injury with Fall?   0 0 0  Risk for fall due to :  History of fall(s)     Risk for fall due to: Comment     uses can when walking long distances  Follow up Falls evaluation completed Falls evaluation completed  Falls evaluation completed    FALL RISK PREVENTION PERTAINING TO THE HOME: Home free of loose throw rugs in walkways, pet beds, electrical cords, etc? Yes  Adequate lighting in your home to reduce risk of falls? Yes   ASSISTIVE DEVICES UTILIZED TO PREVENT FALLS: Life alert? No  Use of a cane, walker or w/c? Yes  Grab bars in the bathroom? Yes  Shower chair or bench in shower? Yes  Comfort chair height toilet? Yes   TIMED UP  AND GO: Was the test performed? No .   Cognitive Function: Patient is alert and oriented x3.     11/28/2017    4:47 PM 11/28/2016    4:31 PM  MMSE - Mini Mental State Exam  Orientation to time 5 5  Orientation to Place 5 5  Registration 3 3  Attention/ Calculation 5 5  Recall 3 3  Language- name 2 objects 2 2  Language- repeat 1 1  Language- follow 3 step command 3 3  Language- read & follow direction 1 1  Write a sentence 1 1  Copy design 1 1  Total score 30 30        03/08/2021    2:38 PM 12/01/2018    4:55 PM  6CIT Screen  What Year? 0 points 0 points  What month? 0 points  0 points  What time? 0 points 0 points  Count back from 20 0 points 0 points  Months in reverse 0 points 0 points  Repeat phrase 0 points 0 points  Total Score 0 points 0 points    Immunizations Immunization History  Administered Date(s) Administered   Fluad Quad(high Dose 65+) 08/25/2019, 09/12/2020   Influenza Split 09/25/2012   Influenza Whole 09/07/2013   Influenza, High Dose Seasonal PF 08/13/2016, 09/02/2017, 08/22/2018, 08/25/2021   Influenza,inj,Quad PF,6+ Mos 07/27/2014, 08/25/2015   PFIZER Comirnaty(Gray Top)Covid-19 Tri-Sucrose Vaccine 04/24/2021   PFIZER(Purple Top)SARS-COV-2 Vaccination 12/29/2019, 01/19/2020, 09/02/2020   Pfizer Covid-19 Vaccine Bivalent Booster 4yrs & up 09/07/2021   Pneumococcal Conjugate-13 01/18/2014   Pneumococcal Polysaccharide-23 03/15/2012   Tdap 02/28/2012, 04/20/2019   Zoster Recombinat (Shingrix) 10/14/2019, 12/21/2019   Zoster, Live 08/27/2012   Screening Tests Health Maintenance  Topic Date Due   FOOT EXAM  03/19/2022 (Originally 09/15/2021)   HEMOGLOBIN A1C  04/27/2022   INFLUENZA VACCINE  06/19/2022   MAMMOGRAM  11/09/2022   OPHTHALMOLOGY EXAM  11/28/2022   TETANUS/TDAP  04/19/2029   Pneumonia Vaccine 26+ Years old  Completed   DEXA SCAN  Completed   COVID-19 Vaccine  Completed   Hepatitis C Screening  Completed   Zoster Vaccines-  Shingrix  Completed   HPV VACCINES  Aged Out   COLONOSCOPY (Pts 45-75yrs Insurance coverage will need to be confirmed)  Discontinued   Health Maintenance There are no preventive care reminders to display for this patient.  Lung Cancer Screening: (Low Dose CT Chest recommended if Age 79-80 years, 30 pack-year currently smoking OR have quit w/in 15years.) does not qualify.   Vision Screening: Recommended annual ophthalmology exams for early detection of glaucoma and other disorders of the eye.  Dental Screening: Recommended annual dental exams for proper oral hygiene  Community Resource Referral / Chronic Care Management: CRR required this visit?  No   CCM required this visit?  No      Plan:   Keep all routine maintenance appointments.   I have personally reviewed and noted the following in the patient's chart:   Medical and social history Use of alcohol, tobacco or illicit drugs  Current medications and supplements including opioid prescriptions. Taking Ultram, followed by Surgery.  Functional ability and status Nutritional status Physical activity Advanced directives List of other physicians Hospitalizations, surgeries, and ER visits in previous 12 months Vitals Screenings to include cognitive, depression, and falls Referrals and appointments  In addition, I have reviewed and discussed with patient certain preventive protocols, quality metrics, and best practice recommendations. A written personalized care plan for preventive services as well as general preventive health recommendations were provided to patient.     Ashok Pall, LPN   [id number]

## 2022-03-12 ENCOUNTER — Encounter: Payer: Self-pay | Admitting: Internal Medicine

## 2022-03-12 NOTE — Telephone Encounter (Signed)
S/w pt and pt wife - scheduled for Friday to discuss  ?

## 2022-03-13 ENCOUNTER — Other Ambulatory Visit: Payer: Medicare HMO

## 2022-03-13 DIAGNOSIS — J301 Allergic rhinitis due to pollen: Secondary | ICD-10-CM | POA: Diagnosis not present

## 2022-03-19 ENCOUNTER — Other Ambulatory Visit: Payer: Self-pay | Admitting: Internal Medicine

## 2022-03-20 DIAGNOSIS — J301 Allergic rhinitis due to pollen: Secondary | ICD-10-CM | POA: Diagnosis not present

## 2022-03-25 ENCOUNTER — Other Ambulatory Visit: Payer: Self-pay | Admitting: Internal Medicine

## 2022-03-27 DIAGNOSIS — J301 Allergic rhinitis due to pollen: Secondary | ICD-10-CM | POA: Diagnosis not present

## 2022-04-03 DIAGNOSIS — J301 Allergic rhinitis due to pollen: Secondary | ICD-10-CM | POA: Diagnosis not present

## 2022-04-06 ENCOUNTER — Encounter: Payer: Self-pay | Admitting: Internal Medicine

## 2022-04-07 NOTE — Telephone Encounter (Signed)
Erin Camacho, can you see me about this.  Would like earlier appt with pulmonary.

## 2022-04-08 ENCOUNTER — Other Ambulatory Visit: Payer: Self-pay | Admitting: Internal Medicine

## 2022-04-10 ENCOUNTER — Encounter: Payer: Medicare HMO | Admitting: Internal Medicine

## 2022-04-10 DIAGNOSIS — J301 Allergic rhinitis due to pollen: Secondary | ICD-10-CM | POA: Diagnosis not present

## 2022-04-12 ENCOUNTER — Other Ambulatory Visit (INDEPENDENT_AMBULATORY_CARE_PROVIDER_SITE_OTHER): Payer: Medicare HMO

## 2022-04-12 DIAGNOSIS — E7800 Pure hypercholesterolemia, unspecified: Secondary | ICD-10-CM | POA: Diagnosis not present

## 2022-04-12 DIAGNOSIS — E119 Type 2 diabetes mellitus without complications: Secondary | ICD-10-CM | POA: Diagnosis not present

## 2022-04-12 DIAGNOSIS — I10 Essential (primary) hypertension: Secondary | ICD-10-CM | POA: Diagnosis not present

## 2022-04-12 LAB — CBC WITH DIFFERENTIAL/PLATELET
Basophils Absolute: 0 10*3/uL (ref 0.0–0.1)
Basophils Relative: 0.9 % (ref 0.0–3.0)
Eosinophils Absolute: 0.2 10*3/uL (ref 0.0–0.7)
Eosinophils Relative: 4.9 % (ref 0.0–5.0)
HCT: 38.5 % (ref 36.0–46.0)
Hemoglobin: 12.9 g/dL (ref 12.0–15.0)
Lymphocytes Relative: 33.4 % (ref 12.0–46.0)
Lymphs Abs: 1.5 10*3/uL (ref 0.7–4.0)
MCHC: 33.5 g/dL (ref 30.0–36.0)
MCV: 94.2 fl (ref 78.0–100.0)
Monocytes Absolute: 0.4 10*3/uL (ref 0.1–1.0)
Monocytes Relative: 9.3 % (ref 3.0–12.0)
Neutro Abs: 2.3 10*3/uL (ref 1.4–7.7)
Neutrophils Relative %: 51.5 % (ref 43.0–77.0)
Platelets: 177 10*3/uL (ref 150.0–400.0)
RBC: 4.08 Mil/uL (ref 3.87–5.11)
RDW: 14.2 % (ref 11.5–15.5)
WBC: 4.4 10*3/uL (ref 4.0–10.5)

## 2022-04-12 LAB — BASIC METABOLIC PANEL WITH GFR
BUN: 20 mg/dL (ref 6–23)
CO2: 29 meq/L (ref 19–32)
Calcium: 10 mg/dL (ref 8.4–10.5)
Chloride: 104 meq/L (ref 96–112)
Creatinine, Ser: 0.79 mg/dL (ref 0.40–1.20)
GFR: 72.4 mL/min
Glucose, Bld: 104 mg/dL — ABNORMAL HIGH (ref 70–99)
Potassium: 4.1 meq/L (ref 3.5–5.1)
Sodium: 141 meq/L (ref 135–145)

## 2022-04-12 LAB — HEPATIC FUNCTION PANEL
ALT: 12 U/L (ref 0–35)
AST: 18 U/L (ref 0–37)
Albumin: 4.4 g/dL (ref 3.5–5.2)
Alkaline Phosphatase: 70 U/L (ref 39–117)
Bilirubin, Direct: 0.2 mg/dL (ref 0.0–0.3)
Total Bilirubin: 0.9 mg/dL (ref 0.2–1.2)
Total Protein: 6.7 g/dL (ref 6.0–8.3)

## 2022-04-12 LAB — LIPID PANEL
Cholesterol: 167 mg/dL (ref 0–200)
HDL: 67.1 mg/dL
LDL Cholesterol: 77 mg/dL (ref 0–99)
NonHDL: 99.6
Total CHOL/HDL Ratio: 2
Triglycerides: 115 mg/dL (ref 0.0–149.0)
VLDL: 23 mg/dL (ref 0.0–40.0)

## 2022-04-12 LAB — HEMOGLOBIN A1C: Hgb A1c MFr Bld: 5.8 % (ref 4.6–6.5)

## 2022-04-12 LAB — TSH: TSH: 2.24 u[IU]/mL (ref 0.35–5.50)

## 2022-04-17 ENCOUNTER — Ambulatory Visit (INDEPENDENT_AMBULATORY_CARE_PROVIDER_SITE_OTHER): Payer: Medicare HMO | Admitting: Internal Medicine

## 2022-04-17 ENCOUNTER — Encounter: Payer: Self-pay | Admitting: Internal Medicine

## 2022-04-17 DIAGNOSIS — F320 Major depressive disorder, single episode, mild: Secondary | ICD-10-CM | POA: Diagnosis not present

## 2022-04-17 DIAGNOSIS — I700 Atherosclerosis of aorta: Secondary | ICD-10-CM

## 2022-04-17 DIAGNOSIS — E7800 Pure hypercholesterolemia, unspecified: Secondary | ICD-10-CM | POA: Diagnosis not present

## 2022-04-17 DIAGNOSIS — Z0000 Encounter for general adult medical examination without abnormal findings: Secondary | ICD-10-CM

## 2022-04-17 DIAGNOSIS — E119 Type 2 diabetes mellitus without complications: Secondary | ICD-10-CM | POA: Diagnosis not present

## 2022-04-17 DIAGNOSIS — K219 Gastro-esophageal reflux disease without esophagitis: Secondary | ICD-10-CM

## 2022-04-17 DIAGNOSIS — Z9884 Bariatric surgery status: Secondary | ICD-10-CM | POA: Diagnosis not present

## 2022-04-17 DIAGNOSIS — I10 Essential (primary) hypertension: Secondary | ICD-10-CM

## 2022-04-17 DIAGNOSIS — R69 Illness, unspecified: Secondary | ICD-10-CM | POA: Diagnosis not present

## 2022-04-17 DIAGNOSIS — J301 Allergic rhinitis due to pollen: Secondary | ICD-10-CM | POA: Diagnosis not present

## 2022-04-17 MED ORDER — AZELASTINE HCL 137 MCG/SPRAY NA SOLN: NASAL | 2 refills | Status: DC

## 2022-04-17 NOTE — Telephone Encounter (Signed)
Do you mind calling pulmonary and see if they can see him for a pre op evaluation - prior to 05/14/22 surgery.

## 2022-04-17 NOTE — Progress Notes (Signed)
Patient ID: Erin Camacho, female   DOB: 1946/06/24, 76 y.o.   MRN: [id number]   Subjective:    Patient ID: Erin Camacho, female    DOB: 08-09-1946, 76 y.o.   MRN: [id number]   Patient here for her physical exam.   Chief Complaint  Patient presents with   Follow-up    Yearly CPE   .   HPI Saw Dr Sherryll Burger 03/06/22 - explding head syndrome.  MRI negative fo SAH.  Consider daily aspirin.  Stable.  No chest pain.  Breathing stable.  Is concerned regarding her weight. Is s/p lap band.  Request referral back to bariatric clinic to discuss.  She is concerned band may have slipped or moved - since able to eat more and gaining weight.  No pain.  Bowels moving.  Handling stress.     Past Medical History:  Diagnosis Date   Allergy    hay fever   Anxiety    Arthritis    Chronic back pain    Diabetes mellitus    Dyspnea    a. 07/2017 Echo: EF 60-65%. No rwma. Mildly dil LA. Nl RV fxn.   GERD (gastroesophageal reflux disease)    History of hiatal hernia    Hyperlipidemia    Hypertension    Osteoarthritis    Pneumonia    Spinal stenosis    Type 2 diabetes mellitus (HCC)    UTI (lower urinary tract infection)    Past Surgical History:  Procedure Laterality Date   ABDOMINAL HYSTERECTOMY  1973   ABDOMINAL HYSTERECTOMY     APPENDECTOMY     APPLICATION OF ROBOTIC ASSISTANCE FOR SPINAL PROCEDURE N/A 09/15/2018   Procedure: APPLICATION OF ROBOTIC ASSISTANCE FOR SPINAL PROCEDURE;  Surgeon: Barnett Abu, MD;  Location: [facility] OR;  Service: Neurosurgery;  Laterality: N/A;   BACK SURGERY  04/17/13, 2017, 2019   CATARACT EXTRACTION W/PHACO Left 12/16/2019   Procedure: CATARACT EXTRACTION PHACO AND INTRAOCULAR LENS PLACEMENT (IOC) LEFT PANOPTIX LENS DIABETIC 8.07 01:19.2 10.2%;  Surgeon: Lockie Mola, MD;  Location: [facility];  Service: Ophthalmology;  Laterality: Left;  Diabetic - oral meds   CATARACT EXTRACTION W/PHACO Right 01/06/2020   Procedure: CATARACT EXTRACTION PHACO AND INTRAOCULAR  LENS PLACEMENT (IOC) PANOPTIX LENS RIGHT DIABETIC 10.73 01:08.6 15.7%;  Surgeon: Lockie Mola, MD;  Location: [facility];  Service: Ophthalmology;  Laterality: Right;  diabetic   CHOLECYSTECTOMY  1995   COLONOSCOPY WITH PROPOFOL N/A 03/15/2021   Procedure: COLONOSCOPY WITH PROPOFOL;  Surgeon: Earline Mayotte, MD;  Location: [facility];  Service: Endoscopy;  Laterality: N/A;   EYE SURGERY  2021   bilateral cataract removal with lens implants   HERNIA REPAIR  08/2006   lap band  08/2003   LUMBAR LAMINECTOMY/ DECOMPRESSION WITH MET-RX Left 12/29/2020   Procedure: Left Thoracic Seven-Eight Microdiscectomy;  Surgeon: Barnett Abu, MD;  Location: [facility] OR;  Service: Neurosurgery;  Laterality: Left;   SEPTOPLASTY  04/1978   TONSILLECTOMY AND ADENOIDECTOMY  1979   TOTAL HIP ARTHROPLASTY  02/2012   Right, Dr. Ernest Pine   TOTAL HIP ARTHROPLASTY Left 08/31/2019   Procedure: TOTAL HIP ARTHROPLASTY;  Surgeon: Donato Heinz, MD;  Location: [facility];  Service: Orthopedics;  Laterality: Left;   Family History  Problem Relation Age of Onset   Hypertension Mother    Stroke Mother    Heart disease Mother    Hypertension Father    Stroke Father    Heart disease Father    Arthritis Sister  Diabetes Sister    Cancer Sister    Diabetes Brother    Hypertension Brother    Dementia Brother    Diabetes Brother    Hypertension Brother    Dementia Brother    Breast cancer Other 60   Cancer Other        breast   Diabetes Other    Kidney disease Neg Hx    Bladder Cancer Neg Hx    Social History   Socioeconomic History   Marital status: Married    Spouse name: Not on file   Number of children: Not on file   Years of education: Not on file   Highest education level: Not on file  Occupational History   Not on file  Tobacco Use   Smoking status: Never   Smokeless tobacco: Never  Vaping Use   Vaping Use: Never used  Substance and Sexual Activity   Alcohol use: Yes     Alcohol/week: 0.0 - 1.0 standard drinks    Comment: RARELY   Drug use: No   Sexual activity: Not Currently  Other Topics Concern   Not on file  Social History Narrative   Lives in [place] with husband.      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 Strain: Low Risk    Difficulty of Paying Living Expenses: Not hard at [place]: No Food Insecurity   Worried About Charity fundraiser in [place]: Never true   Arboriculturist in the Last Year: Never true  Transportation Needs: No Data processing manager (Medical): No   Lack of Transportation (Non-Medical): No  Physical Activity: Not on file  Stress: No Stress Concern Present   Feeling of Stress : Not at all  Social Connections: Unknown   Frequency of Communication with Friends and Family: Not on file   Frequency of Social Gatherings with Friends and Family: Not on file   Attends Religious Services: Not on file   Active Member of Clubs or Organizations: Not on file   Attends Archivist Meetings: Not on file   Marital Status: Married     Review of Systems  Constitutional:  Negative for appetite change and unexpected weight change.  HENT:  Negative for congestion, sinus pressure and sore throat.   Eyes:  Negative for pain and visual disturbance.  Respiratory:  Negative for cough, chest tightness and shortness of breath.   Cardiovascular:  Negative for chest pain, palpitations and leg swelling.  Gastrointestinal:  Negative for abdominal pain, diarrhea, nausea and vomiting.  Genitourinary:  Negative for difficulty urinating and dysuria.  Musculoskeletal:  Negative for joint swelling and myalgias.  Skin:  Negative for color change and rash.  Neurological:  Negative for dizziness, light-headedness and headaches.  Hematological:  Negative for adenopathy. Does not bruise/bleed easily.  Psychiatric/Behavioral:  Negative for agitation and  dysphoric mood.       Objective:     BP 136/80 (BP Location: Left Arm, Patient Position: Sitting, Cuff Size: Large)   Pulse 63   Temp 97.6 F (36.4 C) (Temporal)   Resp 14   Ht $R'5\' 5"'Bw$  (1.651 m)   Wt 200 lb 3.2 oz (90.8 kg)   SpO2 97%   BMI 33.32 kg/m  Wt Readings from Last 3 Encounters:  04/17/22 200 lb 3.2 oz (90.8 kg)  03/09/22 199 lb (90.3 kg)  01/31/22 199 lb 3.2 oz (90.4  kg)    Physical Exam Vitals reviewed.  Constitutional:      General: She is not in acute distress.    Appearance: Normal appearance. She is well-developed.  HENT:     Head: Normocephalic and atraumatic.     Right Ear: External ear normal.     Left Ear: External ear normal.  Eyes:     General: No scleral icterus.       Right eye: No discharge.        Left eye: No discharge.     Conjunctiva/sclera: Conjunctivae normal.  Neck:     Thyroid: No thyromegaly.  Cardiovascular:     Rate and Rhythm: Normal rate and regular rhythm.  Pulmonary:     Effort: No tachypnea, accessory muscle usage or respiratory distress.     Breath sounds: Normal breath sounds. No decreased breath sounds or wheezing.  Chest:  Breasts:    Right: No inverted nipple, mass, nipple discharge or tenderness (no axillary adenopathy).     Left: No inverted nipple, mass, nipple discharge or tenderness (no axilarry adenopathy).  Abdominal:     General: Bowel sounds are normal.     Palpations: Abdomen is soft.     Tenderness: There is no abdominal tenderness.  Musculoskeletal:        General: No swelling or tenderness.     Cervical back: Neck supple. No tenderness.  Lymphadenopathy:     Cervical: No cervical adenopathy.  Skin:    Findings: No erythema or rash.  Neurological:     Mental Status: She is alert and oriented to person, place, and time.  Psychiatric:        Mood and Affect: Mood normal.        Behavior: Behavior normal.     Outpatient Encounter Medications as of 04/17/2022  Medication Sig   acetaminophen (TYLENOL)  650 MG CR tablet Take 1,950 mg by mouth in the morning and at bedtime.   ALPRAZolam (XANAX) 0.25 MG tablet Take 1 tablet (0.25 mg total) by mouth daily as needed for anxiety.   baclofen (LIORESAL) 10 MG tablet Take 10 mg by mouth 3 (three) times daily.   cholecalciferol (VITAMIN D) 25 MCG (1000 UNIT) tablet Take 1,000 Units by mouth daily.   Ferrous Sulfate (IRON) 325 (65 Fe) MG TABS Take 325 mg by mouth daily.   fexofenadine (ALLEGRA) 180 MG tablet Take 1 tablet (180 mg total) by mouth daily.   fluticasone (FLONASE) 50 MCG/ACT nasal spray SPRAY 2 SPRAYS INTO EACH NOSTRIL EVERY DAY   losartan-hydrochlorothiazide (HYZAAR) 50-12.5 MG tablet TAKE 1 TABLET BY MOUTH EVERY DAY   metFORMIN (GLUCOPHAGE) 500 MG tablet TAKE 1 TABLET BY MOUTH EVERY DAY   Multiple Vitamin (MULTIVITAMIN WITH MINERALS) TABS tablet Take 1 tablet by mouth daily.   OneTouch Delica Lancets 41P MISC Use to check blood sugar x per day. DX E11.9.   ONETOUCH VERIO test strip USE TO CHECK BLOOD SUGAR TWICE A DAY   pantoprazole (PROTONIX) 40 MG tablet TAKE 1 TABLET (40 MG TOTAL) BY MOUTH 2 (TWO) TIMES DAILY BEFORE A MEAL.   sertraline (ZOLOFT) 100 MG tablet TAKE 1/2 TABLET BY MOUTH (50 MG) BY MOUTH DAILY   simvastatin (ZOCOR) 10 MG tablet TAKE 1/2 TABLET (5 MG TOTAL) BY MOUTH AT BEDTIME.   traMADol (ULTRAM) 50 MG tablet Take 50 mg by mouth every 12 (twelve) hours as needed.   traZODone (DESYREL) 100 MG tablet TAKE 1 TABLET BY MOUTH EVERYDAY AT BEDTIME   [DISCONTINUED] Azelastine  HCl 137 MCG/SPRAY SOLN PLACE 1 SPRAY INTO BOTH NOSTRILS 2 (TWO) TIMES DAILY. USE IN EACH NOSTRIL AS DIRECTED   Azelastine HCl 137 MCG/SPRAY SOLN PLACE 1 SPRAY INTO BOTH NOSTRILS 2 (TWO) TIMES DAILY. USE IN EACH NOSTRIL AS DIRECTED   No facility-administered encounter medications on file as of 04/17/2022.     Lab Results  Component Value Date   WBC 4.4 04/12/2022   HGB 12.9 04/12/2022   HCT 38.5 04/12/2022   PLT 177.0 04/12/2022   GLUCOSE 104 (H)  04/12/2022   CHOL 167 04/12/2022   TRIG 115.0 04/12/2022   HDL 67.10 04/12/2022   LDLDIRECT 146.5 09/19/2012   LDLCALC 77 04/12/2022   ALT 12 04/12/2022   AST 18 04/12/2022   NA 141 04/12/2022   K 4.1 04/12/2022   CL 104 04/12/2022   CREATININE 0.79 04/12/2022   BUN 20 04/12/2022   CO2 29 04/12/2022   TSH 2.24 04/12/2022   INR 1.0 08/21/2019   HGBA1C 5.8 04/12/2022   MICROALBUR 1.1 05/12/2021    US Carotid Duplex Bilateral  Result Date: 02/07/2022 CLINICAL DATA:  Carotid atherosclerosis, hypertension, hyperlipidemia and diabetes, visual disturbance EXAM: BILATERAL CAROTID DUPLEX ULTRASOUND TECHNIQUE: Pearline Cables scale imaging, color Doppler and duplex ultrasound were performed of bilateral carotid and vertebral arteries in the neck. COMPARISON:  None. FINDINGS: Criteria: Quantification of carotid stenosis is based on velocity parameters that correlate the residual internal carotid diameter with NASCET-based stenosis levels, using the diameter of the distal internal carotid lumen as the denominator for stenosis measurement. The following velocity measurements were obtained: RIGHT ICA: 89/20 cm/sec CCA: 803/21 cm/sec SYSTOLIC ICA/CCA RATIO:  0.7 ECA: 83 cm/sec LEFT ICA: 77/22 cm/sec CCA: 22/48 cm/sec SYSTOLIC ICA/CCA RATIO:  0.9 ECA: 78 cm/sec RIGHT CAROTID ARTERY: Intimal thickening and trace thin hypoechoic atherosclerosis. Negative for stenosis, velocity elevation, or turbulent flow. Tortuosity noted. Degree of narrowing less than 50% by ultrasound criteria. RIGHT VERTEBRAL ARTERY:  Normal antegrade flow LEFT CAROTID ARTERY: Similar intimal thickening and trace thin hypoechoic atherosclerosis. Negative for stenosis, velocity elevation, or turbulent flow. Tortuosity also noted. Degree of narrowing also less than 50% by ultrasound criteria. LEFT VERTEBRAL ARTERY:  Normal antegrade flow IMPRESSION: Minor carotid atherosclerosis and intimal thickening. Negative for stenosis. Degree of narrowing less than  50% bilaterally by ultrasound criteria. Patent antegrade vertebral flow bilaterally Electronically Signed   By: Jerilynn Mages.  Shick M.D.   On: 02/07/2022 13:02       Assessment & Plan:   Problem List Items Addressed This Visit     Aortic atherosclerosis ([place])    Continue simvastatin.        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  Stable.  Follow.         Diabetes mellitus type 2, diet-controlled (HCC)    Low carb diet and exercise.  Follow met b and a1c.   Follow.  Stay hydrated.   Lab Results  Component Value Date   HGBA1C 5.8 04/12/2022       Essential (primary) hypertension    Blood pressure as outlined.  Continue losartan/hctz. Elevated.  Follow pressures.  Follow metabolic panel.  Get her back in soon to reassess.        GERD (gastroesophageal reflux disease)    No acid reflux reported.  On protonix.        Healthcare maintenance    Physical today 04/17/22.  Mammogram 11/09/21 - Birads I.  Colonoscopy 03/13/21 - tubular adenoma (rectal polyp).  Recommended  f/u in 7 years.         History of bariatric surgery    Feels is able to eat more.  Not getting full.  S/p lap band.  Request referral back to her surgeon for evaluation.        Relevant Orders   Amb Referral to Bariatric Surgery   Hypercholesterolemia    Continue simvastatin.  Low cholesterol diet and exercise.  Follow lipid panel and liver function tests.           [name], MD

## 2022-04-17 NOTE — Telephone Encounter (Signed)
Do you mind calling and see if they mind driving to [place].  Thanks

## 2022-04-17 NOTE — Telephone Encounter (Signed)
Sorry I never knew this was sent to me how can I help.

## 2022-04-17 NOTE — Telephone Encounter (Signed)
The only way to get earlier appointment is if patient is willing to go to [facility] and see a different provider.  pulmonary is booked out until August.

## 2022-04-22 ENCOUNTER — Encounter: Payer: Self-pay | Admitting: Internal Medicine

## 2022-04-22 NOTE — Assessment & Plan Note (Signed)
Physical today 04/17/22.  Mammogram 11/09/21 - Birads I.  Colonoscopy 03/13/21 - tubular adenoma (rectal polyp).  Recommended f/u in 7 years.

## 2022-04-22 NOTE — Assessment & Plan Note (Signed)
No acid reflux reported.  On protonix.  

## 2022-04-22 NOTE — Assessment & Plan Note (Signed)
Continue simvastatin. 

## 2022-04-22 NOTE — Assessment & Plan Note (Signed)
Feels is able to eat more.  Not getting full.  S/p lap band.  Request referral back to her surgeon for evaluation.

## 2022-04-22 NOTE — Assessment & Plan Note (Signed)
Continue simvastatin.  Low cholesterol diet and exercise.  Follow lipid panel and liver function tests.   

## 2022-04-22 NOTE — Assessment & Plan Note (Addendum)
Blood pressure as outlined.  Continue losartan/hctz. Elevated.  Follow pressures.  Follow metabolic panel.  Get her back in soon to reassess.

## 2022-04-22 NOTE — Assessment & Plan Note (Signed)
Continue on zoloft.  Stable.  Follow.   

## 2022-04-22 NOTE — Assessment & Plan Note (Signed)
Low carb diet and exercise.  Follow met b and a1c.   Follow.  Stay hydrated.   Lab Results  Component Value Date   HGBA1C 5.8 04/12/2022

## 2022-04-23 NOTE — Telephone Encounter (Signed)
See note in patient chart.

## 2022-04-24 DIAGNOSIS — J301 Allergic rhinitis due to pollen: Secondary | ICD-10-CM | POA: Diagnosis not present

## 2022-05-01 DIAGNOSIS — J301 Allergic rhinitis due to pollen: Secondary | ICD-10-CM | POA: Diagnosis not present

## 2022-05-06 ENCOUNTER — Encounter: Payer: Self-pay | Admitting: Internal Medicine

## 2022-05-07 DIAGNOSIS — J301 Allergic rhinitis due to pollen: Secondary | ICD-10-CM | POA: Diagnosis not present

## 2022-05-14 DIAGNOSIS — H16141 Punctate keratitis, right eye: Secondary | ICD-10-CM | POA: Diagnosis not present

## 2022-05-15 DIAGNOSIS — J301 Allergic rhinitis due to pollen: Secondary | ICD-10-CM | POA: Diagnosis not present

## 2022-05-17 ENCOUNTER — Encounter: Payer: Self-pay | Admitting: Internal Medicine

## 2022-05-17 ENCOUNTER — Ambulatory Visit (INDEPENDENT_AMBULATORY_CARE_PROVIDER_SITE_OTHER): Payer: Medicare HMO | Admitting: Internal Medicine

## 2022-05-17 VITALS — BP 126/74 | HR 67 | Temp 97.9°F | Resp 15 | Ht 65.0 in | Wt 200.6 lb

## 2022-05-17 DIAGNOSIS — K219 Gastro-esophageal reflux disease without esophagitis: Secondary | ICD-10-CM | POA: Diagnosis not present

## 2022-05-17 DIAGNOSIS — I10 Essential (primary) hypertension: Secondary | ICD-10-CM | POA: Diagnosis not present

## 2022-05-17 DIAGNOSIS — E119 Type 2 diabetes mellitus without complications: Secondary | ICD-10-CM

## 2022-05-17 DIAGNOSIS — E7800 Pure hypercholesterolemia, unspecified: Secondary | ICD-10-CM | POA: Diagnosis not present

## 2022-05-17 DIAGNOSIS — W19XXXA Unspecified fall, initial encounter: Secondary | ICD-10-CM

## 2022-05-17 DIAGNOSIS — I700 Atherosclerosis of aorta: Secondary | ICD-10-CM | POA: Diagnosis not present

## 2022-05-17 DIAGNOSIS — R69 Illness, unspecified: Secondary | ICD-10-CM | POA: Diagnosis not present

## 2022-05-17 DIAGNOSIS — F320 Major depressive disorder, single episode, mild: Secondary | ICD-10-CM | POA: Diagnosis not present

## 2022-05-17 NOTE — Progress Notes (Signed)
Patient ID: Erin Camacho, female   DOB: 17-Apr-1946, 76 y.o.   MRN: [id number]   Subjective:    Patient ID: Erin Camacho, female    DOB: [birth year], 76 y.o.   MRN: [id number]   Patient here for a scheduled follow up.   Chief Complaint  Patient presents with   Hypertension   Stress   .   HPI Increased stress with her husband's health issues.  He is in rehab currently after suffering a hip fracture s/p fall.  Discussed.  Does not feel she needs anything more at this time.  She did fall thi week.  She was carrying a lot in her hands and her tennis shoes caught on the floor.  Fell into her couch.  No head injury.  Injured the right side and right upper arm. No significant pain now.  No significant residual problems from the fall.  Still with her chronic back issues.  Will flare intermittently.  Takes baclofen.  Has f/u end of July.  No chest pain.  Breathing stable.  No abdominal pain or bowel change reported.     Past Medical History:  Diagnosis Date   Allergy    hay fever   Anxiety    Arthritis    Chronic back pain    Diabetes mellitus    Dyspnea    a. 07/2017 Echo: EF 60-65%. No rwma. Mildly dil LA. Nl RV fxn.   GERD (gastroesophageal reflux disease)    History of hiatal hernia    Hyperlipidemia    Hypertension    Osteoarthritis    Pneumonia    Spinal stenosis    Type 2 diabetes mellitus ([place])    UTI (lower urinary tract infection)    Past Surgical History:  Procedure Laterality Date   ABDOMINAL HYSTERECTOMY  1973   ABDOMINAL HYSTERECTOMY     APPENDECTOMY     APPLICATION OF ROBOTIC ASSISTANCE FOR SPINAL PROCEDURE N/A 09/15/2018   Procedure: APPLICATION OF ROBOTIC ASSISTANCE FOR SPINAL PROCEDURE;  Surgeon: Kristeen Miss, MD;  Location: [place];  Service: Neurosurgery;  Laterality: N/A;   BACK SURGERY  04/17/13, 2017, 2019   CATARACT EXTRACTION W/PHACO Left 12/16/2019   Procedure: CATARACT EXTRACTION PHACO AND INTRAOCULAR LENS PLACEMENT (IOC) LEFT PANOPTIX LENS DIABETIC 8.07  01:19.2 10.2%;  Surgeon: Leandrew Koyanagi, MD;  Location: [place];  Service: Ophthalmology;  Laterality: Left;  Diabetic - oral meds   CATARACT EXTRACTION W/PHACO Right 01/06/2020   Procedure: CATARACT EXTRACTION PHACO AND INTRAOCULAR LENS PLACEMENT (IOC) PANOPTIX LENS RIGHT DIABETIC 10.73 01:08.6 15.7%;  Surgeon: Leandrew Koyanagi, MD;  Location: [place];  Service: Ophthalmology;  Laterality: Right;  diabetic   CHOLECYSTECTOMY  1995   COLONOSCOPY WITH PROPOFOL N/A 03/15/2021   Procedure: COLONOSCOPY WITH PROPOFOL;  Surgeon: Robert Bellow, MD;  Location: [facility];  Service: Endoscopy;  Laterality: N/A;   EYE SURGERY  2021   bilateral cataract removal with lens implants   HERNIA REPAIR  08/2006   lap band  08/2003   LUMBAR LAMINECTOMY/ DECOMPRESSION WITH MET-RX Left 12/29/2020   Procedure: Left Thoracic Seven-Eight Microdiscectomy;  Surgeon: Kristeen Miss, MD;  Location: [place];  Service: Neurosurgery;  Laterality: Left;   SEPTOPLASTY  04/1978   TONSILLECTOMY AND ADENOIDECTOMY  1979   TOTAL HIP ARTHROPLASTY  02/2012   Right, Dr. Marry Guan   TOTAL HIP ARTHROPLASTY Left 08/31/2019   Procedure: TOTAL HIP ARTHROPLASTY;  Surgeon: Dereck Leep, MD;  Location: [facility];  Service: Orthopedics;  Laterality: Left;  Family History  Problem Relation Age of Onset   Hypertension Mother    Stroke Mother    Heart disease Mother    Hypertension Father    Stroke Father    Heart disease Father    Arthritis Sister    Diabetes Sister    Cancer Sister    Diabetes Brother    Hypertension Brother    Dementia Brother    Diabetes Brother    Hypertension Brother    Dementia Brother    Breast cancer Other 4   Cancer Other        breast   Diabetes Other    Kidney disease Neg Hx    Bladder Cancer Neg Hx    Social History   Socioeconomic History   Marital status: Married    Spouse name: Not on file   Number of children: Not on file   Years of education: Not on  file   Highest education level: Not on file  Occupational History   Not on file  Tobacco Use   Smoking status: Never   Smokeless tobacco: Never  Vaping Use   Vaping Use: Never used  Substance and Sexual Activity   Alcohol use: Yes    Alcohol/week: 0.0 - 1.0 standard drinks of alcohol    Comment: RARELY   Drug use: No   Sexual activity: Not Currently  Other Topics Concern   Not on file  Social History Narrative   Lives in [place] with husband.      Work - retired Sports administrator   Social Determinants of Radio broadcast assistant Strain: [place]  (03/09/2022)   Overall Financial Resource Strain (CARDIA)    Difficulty of Paying Living Expenses: Not hard at all  Food Insecurity: No Food Insecurity (03/09/2022)   Hunger Vital Sign    Worried About Running Out of Food in the Last Year: Never true    [place] in the Last Year: Never true  Transportation Needs: No Transportation Needs (03/09/2022)   PRAPARE - Hydrologist (Medical): No    Lack of Transportation (Non-Medical): No  Physical Activity: Unknown (03/08/2021)   Exercise Vital Sign    Days of Exercise per Week: 0 days    Minutes of Exercise per Session: Not on file  Stress: No Stress Concern Present (03/09/2022)   [place]    Feeling of Stress : Not at all  Social Connections: Unknown (03/09/2022)   Social Connection and Isolation Panel [NHANES]    Frequency of Communication with Friends and Family: Not on file    Frequency of Social Gatherings with Friends and Family: Not on file    Attends Religious Services: Not on file    Active Member of Clubs or Organizations: Not on file    Attends Archivist Meetings: Not on file    Marital Status: Married     Review of Systems  Constitutional:  Negative for appetite change and unexpected weight change.  HENT:  Negative for congestion and sinus pressure.    Respiratory:  Negative for cough, chest tightness and shortness of breath.   Cardiovascular:  Negative for chest pain, palpitations and leg swelling.  Gastrointestinal:  Negative for abdominal pain, diarrhea, nausea and vomiting.  Genitourinary:  Negative for difficulty urinating and dysuria.  Musculoskeletal:  Negative for joint swelling and myalgias.  Skin:  Negative for color change and rash.  Neurological:  Negative  for dizziness, light-headedness and headaches.  Psychiatric/Behavioral:  Negative for agitation and dysphoric mood.        Objective:     BP 126/74 (BP Location: Left Arm, Patient Position: Sitting, Cuff Size: Large)   Pulse 67   Temp 97.9 F (36.6 C) (Temporal)   Resp 15   Ht _0  (1.651 m)   Wt 200 lb 9.6 oz (91 kg)   SpO2 98%   BMI 33.38 kg/m  Wt Readings from Last 3 Encounters:  05/17/22 200 lb 9.6 oz (91 kg)  04/17/22 200 lb 3.2 oz (90.8 kg)  03/09/22 199 lb (90.3 kg)    Physical Exam Vitals reviewed.  Constitutional:      General: She is not in acute distress.    Appearance: Normal appearance.  HENT:     Head: Normocephalic and atraumatic.     Right Ear: External ear normal.     Left Ear: External ear normal.  Eyes:     General: No scleral icterus.       Right eye: No discharge.        Left eye: No discharge.     Conjunctiva/sclera: Conjunctivae normal.  Neck:     Thyroid: No thyromegaly.  Cardiovascular:     Rate and Rhythm: Normal rate and regular rhythm.  Pulmonary:     Effort: No respiratory distress.     Breath sounds: Normal breath sounds. No wheezing.  Abdominal:     General: Bowel sounds are normal.     Palpations: Abdomen is soft.     Tenderness: There is no abdominal tenderness.  Musculoskeletal:        General: No swelling or tenderness.     Cervical back: Neck supple. No tenderness.  Lymphadenopathy:     Cervical: No cervical adenopathy.  Skin:    Findings: No erythema or rash.  Neurological:     Mental Status: She is  alert.  Psychiatric:        Mood and Affect: Mood normal.        Behavior: Behavior normal.      Outpatient Encounter Medications as of 05/17/2022  Medication Sig   acetaminophen (TYLENOL) 650 MG CR tablet Take 1,950 mg by mouth in the morning and at bedtime.   ALPRAZolam (XANAX) 0.25 MG tablet Take 1 tablet (0.25 mg total) by mouth daily as needed for anxiety.   Azelastine HCl 137 MCG/SPRAY SOLN PLACE 1 SPRAY INTO BOTH NOSTRILS 2 (TWO) TIMES DAILY. USE IN EACH NOSTRIL AS DIRECTED   baclofen (LIORESAL) 10 MG tablet Take 10 mg by mouth 3 (three) times daily.   cholecalciferol (VITAMIN D) 25 MCG (1000 UNIT) tablet Take 1,000 Units by mouth daily.   Ferrous Sulfate (IRON) 325 (65 Fe) MG TABS Take 325 mg by mouth daily.   fexofenadine (ALLEGRA) 180 MG tablet Take 1 tablet (180 mg total) by mouth daily.   fluticasone (FLONASE) 50 MCG/ACT nasal spray SPRAY 2 SPRAYS INTO EACH NOSTRIL EVERY DAY   losartan-hydrochlorothiazide (HYZAAR) 50-12.5 MG tablet TAKE 1 TABLET BY MOUTH EVERY DAY   metFORMIN (GLUCOPHAGE) 500 MG tablet TAKE 1 TABLET BY MOUTH EVERY DAY   Multiple Vitamin (MULTIVITAMIN WITH MINERALS) TABS tablet Take 1 tablet by mouth daily.   OneTouch Delica Lancets 95J MISC Use to check blood sugar x per day. DX E11.9.   ONETOUCH VERIO test strip USE TO CHECK BLOOD SUGAR TWICE A DAY   pantoprazole (PROTONIX) 40 MG tablet TAKE 1 TABLET (40 MG TOTAL) BY MOUTH 2 (  TWO) TIMES DAILY BEFORE A MEAL.   sertraline (ZOLOFT) 100 MG tablet TAKE 1/2 TABLET BY MOUTH (50 MG) BY MOUTH DAILY   simvastatin (ZOCOR) 10 MG tablet TAKE 1/2 TABLET (5 MG TOTAL) BY MOUTH AT BEDTIME.   traMADol (ULTRAM) 50 MG tablet Take 50 mg by mouth every 12 (twelve) hours as needed.   traZODone (DESYREL) 100 MG tablet TAKE 1 TABLET BY MOUTH EVERYDAY AT BEDTIME   No facility-administered encounter medications on file as of 05/17/2022.     Lab Results  Component Value Date   WBC 4.4 04/12/2022   HGB 12.9 04/12/2022   HCT 38.5  04/12/2022   PLT 177.0 04/12/2022   GLUCOSE 104 (H) 04/12/2022   CHOL 167 04/12/2022   TRIG 115.0 04/12/2022   HDL 67.10 04/12/2022   LDLDIRECT 146.5 09/19/2012   LDLCALC 77 04/12/2022   ALT 12 04/12/2022   AST 18 04/12/2022   NA 141 04/12/2022   K 4.1 04/12/2022   CL 104 04/12/2022   CREATININE 0.79 04/12/2022   BUN 20 04/12/2022   CO2 29 04/12/2022   TSH 2.24 04/12/2022   INR 1.0 08/21/2019   HGBA1C 5.8 04/12/2022   MICROALBUR 1.2 05/17/2022    US Carotid Duplex Bilateral  Result Date: 02/07/2022 CLINICAL DATA:  Carotid atherosclerosis, hypertension, hyperlipidemia and diabetes, visual disturbance EXAM: BILATERAL CAROTID DUPLEX ULTRASOUND TECHNIQUE: Pearline Cables scale imaging, color Doppler and duplex ultrasound were performed of bilateral carotid and vertebral arteries in the neck. COMPARISON:  None. FINDINGS: Criteria: Quantification of carotid stenosis is based on velocity parameters that correlate the residual internal carotid diameter with NASCET-based stenosis levels, using the diameter of the distal internal carotid lumen as the denominator for stenosis measurement. The following velocity measurements were obtained: RIGHT ICA: 89/20 cm/sec CCA: 659/93 cm/sec SYSTOLIC ICA/CCA RATIO:  0.7 ECA: 83 cm/sec LEFT ICA: 77/22 cm/sec CCA: 57/01 cm/sec SYSTOLIC ICA/CCA RATIO:  0.9 ECA: 78 cm/sec RIGHT CAROTID ARTERY: Intimal thickening and trace thin hypoechoic atherosclerosis. Negative for stenosis, velocity elevation, or turbulent flow. Tortuosity noted. Degree of narrowing less than 50% by ultrasound criteria. RIGHT VERTEBRAL ARTERY:  Normal antegrade flow LEFT CAROTID ARTERY: Similar intimal thickening and trace thin hypoechoic atherosclerosis. Negative for stenosis, velocity elevation, or turbulent flow. Tortuosity also noted. Degree of narrowing also less than 50% by ultrasound criteria. LEFT VERTEBRAL ARTERY:  Normal antegrade flow IMPRESSION: Minor carotid atherosclerosis and intimal  thickening. Negative for stenosis. Degree of narrowing less than 50% bilaterally by ultrasound criteria. Patent antegrade vertebral flow bilaterally Electronically Signed   By: Jerilynn Mages.  Shick M.D.   On: 02/07/2022 13:02       Assessment & Plan:   Problem List Items Addressed This Visit     Aortic atherosclerosis ([place])    Continue simvastatin.       Depression, major, single episode, mild ([place])    Continue on zoloft.  Stable.  Follow.        Diabetes mellitus type 2, diet-controlled ([place]) - Primary    Low carb diet and exercise.  Follow met b and a1c.   Follow.  Stay hydrated.   Lab Results  Component Value Date   HGBA1C 5.8 04/12/2022       Relevant Orders   Urine Microalbumin w/creat. ratio (Completed)   Essential (primary) hypertension    Blood pressure as outlined.  Continue losartan/hctz.  Follow pressures.  Follow metabolic panel.  Reviewed outside blood pressure readings with most averaging: 120-130s/60s.       Fall  Recent fall as outlined.  No residual problems from her fall.  Follow.       GERD (gastroesophageal reflux disease)    No acid reflux reported.  On protonix.       Hypercholesterolemia    Continue simvastatin.  Low cholesterol diet and exercise.  Follow lipid panel and liver function tests.          [name], MD

## 2022-05-18 LAB — MICROALBUMIN / CREATININE URINE RATIO
Creatinine,U: 82.3 mg/dL
Microalb Creat Ratio: 1.4 mg/g (ref 0.0–30.0)
Microalb, Ur: 1.2 mg/dL (ref 0.0–1.9)

## 2022-05-19 ENCOUNTER — Encounter: Payer: Self-pay | Admitting: Internal Medicine

## 2022-05-19 NOTE — Assessment & Plan Note (Signed)
Continue simvastatin.  Low cholesterol diet and exercise.  Follow lipid panel and liver function tests.   

## 2022-05-19 NOTE — Assessment & Plan Note (Signed)
Recent fall as outlined.  No residual problems from her fall.  Follow.

## 2022-05-19 NOTE — Assessment & Plan Note (Signed)
Continue simvastatin. 

## 2022-05-19 NOTE — Assessment & Plan Note (Signed)
No acid reflux reported.  On protonix.  

## 2022-05-19 NOTE — Assessment & Plan Note (Signed)
Low carb diet and exercise.  Follow met b and a1c.   Follow.  Stay hydrated.   Lab Results  Component Value Date   HGBA1C 5.8 04/12/2022   

## 2022-05-19 NOTE — Assessment & Plan Note (Signed)
Continue on zoloft.  Stable.  Follow.

## 2022-05-19 NOTE — Assessment & Plan Note (Addendum)
Blood pressure as outlined.  Continue losartan/hctz.  Follow pressures.  Follow metabolic panel.  Reviewed outside blood pressure readings with most averaging: 120-130s/60s.

## 2022-05-29 DIAGNOSIS — J301 Allergic rhinitis due to pollen: Secondary | ICD-10-CM | POA: Diagnosis not present

## 2022-06-05 DIAGNOSIS — J301 Allergic rhinitis due to pollen: Secondary | ICD-10-CM | POA: Diagnosis not present

## 2022-06-08 ENCOUNTER — Other Ambulatory Visit: Payer: Self-pay

## 2022-06-08 ENCOUNTER — Emergency Department (HOSPITAL_COMMUNITY): Payer: Medicare HMO

## 2022-06-08 ENCOUNTER — Encounter (HOSPITAL_COMMUNITY): Payer: Self-pay | Admitting: Emergency Medicine

## 2022-06-08 ENCOUNTER — Emergency Department (HOSPITAL_COMMUNITY)
Admission: EM | Admit: 2022-06-08 | Discharge: 2022-06-08 | Discharge status: Against Medical Advice | Payer: Medicare HMO | Attending: Student | Admitting: Student

## 2022-06-08 DIAGNOSIS — Z5321 Procedure and treatment not carried out due to patient leaving prior to being seen by health care provider: Secondary | ICD-10-CM | POA: Insufficient documentation

## 2022-06-08 DIAGNOSIS — I700 Atherosclerosis of aorta: Secondary | ICD-10-CM | POA: Diagnosis not present

## 2022-06-08 DIAGNOSIS — M4800 Spinal stenosis, site unspecified: Secondary | ICD-10-CM | POA: Diagnosis not present

## 2022-06-08 DIAGNOSIS — M8588 Other specified disorders of bone density and structure, other site: Secondary | ICD-10-CM | POA: Diagnosis not present

## 2022-06-08 DIAGNOSIS — M549 Dorsalgia, unspecified: Secondary | ICD-10-CM | POA: Diagnosis present

## 2022-06-08 DIAGNOSIS — K8689 Other specified diseases of pancreas: Secondary | ICD-10-CM | POA: Diagnosis not present

## 2022-06-08 DIAGNOSIS — M545 Low back pain, unspecified: Secondary | ICD-10-CM | POA: Diagnosis not present

## 2022-06-08 DIAGNOSIS — Z9884 Bariatric surgery status: Secondary | ICD-10-CM | POA: Diagnosis not present

## 2022-06-08 DIAGNOSIS — K449 Diaphragmatic hernia without obstruction or gangrene: Secondary | ICD-10-CM | POA: Insufficient documentation

## 2022-06-08 DIAGNOSIS — R109 Unspecified abdominal pain: Secondary | ICD-10-CM | POA: Insufficient documentation

## 2022-06-08 DIAGNOSIS — M5459 Other low back pain: Secondary | ICD-10-CM | POA: Diagnosis not present

## 2022-06-08 LAB — CBC
HCT: 39 % (ref 36.0–46.0)
Hemoglobin: 12.9 g/dL (ref 12.0–15.0)
MCH: 31.1 pg (ref 26.0–34.0)
MCHC: 33.1 g/dL (ref 30.0–36.0)
MCV: 94 fL (ref 80.0–100.0)
Platelets: 190 10*3/uL (ref 150–400)
RBC: 4.15 MIL/uL (ref 3.87–5.11)
RDW: 12.8 % (ref 11.5–15.5)
WBC: 4.6 10*3/uL (ref 4.0–10.5)
nRBC: 0 % (ref 0.0–0.2)

## 2022-06-08 LAB — URINALYSIS, ROUTINE W REFLEX MICROSCOPIC
Bilirubin Urine: NEGATIVE
Glucose, UA: NEGATIVE mg/dL
Hgb urine dipstick: NEGATIVE
Ketones, ur: NEGATIVE mg/dL
Nitrite: NEGATIVE
Protein, ur: NEGATIVE mg/dL
Specific Gravity, Urine: 1.017 (ref 1.005–1.030)
pH: 8 (ref 5.0–8.0)

## 2022-06-08 NOTE — ED Triage Notes (Signed)
Patient here with complaint of thoracic back pain, history four back surgeries. Patient called back surgeon but was told no appointment available until September so she came to ED for pain control.

## 2022-06-08 NOTE — ED Provider Triage Note (Addendum)
Emergency Medicine Provider Triage Evaluation Note  Erin Camacho , a 76 y.o. female  was evaluated in triage.  Pt complains of right-sided back pain for the past few days.  Has a history of spinal stenosis and ruptured disc.  Multiple surgeries of the thoracic and lumbar spine.  Says she called her surgeon who was unable to get her in until September. No urinary symptoms, fever, chills, bowel/bladder dysfunction or saddle anesthesia.  No falls.  Takes baclofen for back pain Review of Systems  Positive: Back pain Negative: Red flags outside of age  Physical Exam  BP (!) 159/89 (BP Location: Right Arm)   Pulse 74   Temp 98.7 F (37.1 C) (Oral)   Resp 18   SpO2 92%  Gen:   Awake, no distress   Resp:  Normal effort  MSK:   Moves extremities without difficulty  Other:  Severe right-sided CVA tenderness, minimum midline tenderness  Medical Decision Making  Medically screening exam initiated at 10:02 AM.  Appropriate orders placed.  SHAQUILLE MURDY was informed that the remainder of the evaluation will be completed by another provider, this initial triage assessment does not replace that evaluation, and the importance of remaining in the ED until their evaluation is complete.   We will order CT renal and T/L spine.  Lab work obtained for any potential kidney stone.    Rhae Hammock, PA-C 06/08/22 1003    Tyreik Delahoussaye A, PA-C 06/08/22 1012

## 2022-06-08 NOTE — ED Notes (Signed)
Pt decided to leave and did not want to wait any longer

## 2022-06-12 ENCOUNTER — Other Ambulatory Visit: Payer: Self-pay

## 2022-06-12 ENCOUNTER — Encounter (HOSPITAL_COMMUNITY): Payer: Self-pay

## 2022-06-12 ENCOUNTER — Emergency Department (HOSPITAL_COMMUNITY)
Admission: EM | Admit: 2022-06-12 | Discharge: 2022-06-12 | Disposition: A | Discharge status: Home / Self Care | Payer: Medicare HMO | Attending: Emergency Medicine | Admitting: Emergency Medicine

## 2022-06-12 ENCOUNTER — Emergency Department (HOSPITAL_COMMUNITY): Payer: Medicare HMO

## 2022-06-12 DIAGNOSIS — M546 Pain in thoracic spine: Secondary | ICD-10-CM | POA: Diagnosis present

## 2022-06-12 DIAGNOSIS — M40204 Unspecified kyphosis, thoracic region: Secondary | ICD-10-CM | POA: Diagnosis not present

## 2022-06-12 LAB — CBC WITH DIFFERENTIAL/PLATELET
Abs Immature Granulocytes: 0.01 10*3/uL (ref 0.00–0.07)
Basophils Absolute: 0 10*3/uL (ref 0.0–0.1)
Basophils Relative: 1 %
Eosinophils Absolute: 0.3 10*3/uL (ref 0.0–0.5)
Eosinophils Relative: 7 %
HCT: 39.2 % (ref 36.0–46.0)
Hemoglobin: 12.6 g/dL (ref 12.0–15.0)
Immature Granulocytes: 0 %
Lymphocytes Relative: 25 %
Lymphs Abs: 1.3 10*3/uL (ref 0.7–4.0)
MCH: 31.1 pg (ref 26.0–34.0)
MCHC: 32.1 g/dL (ref 30.0–36.0)
MCV: 96.8 fL (ref 80.0–100.0)
Monocytes Absolute: 0.5 10*3/uL (ref 0.1–1.0)
Monocytes Relative: 10 %
Neutro Abs: 2.9 10*3/uL (ref 1.7–7.7)
Neutrophils Relative %: 57 %
Platelets: 184 10*3/uL (ref 150–400)
RBC: 4.05 MIL/uL (ref 3.87–5.11)
RDW: 13 % (ref 11.5–15.5)
WBC: 5.1 10*3/uL (ref 4.0–10.5)
nRBC: 0 % (ref 0.0–0.2)

## 2022-06-12 LAB — BASIC METABOLIC PANEL WITH GFR
Anion gap: 9 (ref 5–15)
BUN: 17 mg/dL (ref 8–23)
CO2: 27 mmol/L (ref 22–32)
Calcium: 10 mg/dL (ref 8.9–10.3)
Chloride: 102 mmol/L (ref 98–111)
Creatinine, Ser: 0.92 mg/dL (ref 0.44–1.00)
GFR, Estimated: >60 mL/min
Glucose, Bld: 121 mg/dL — ABNORMAL HIGH (ref 70–99)
Potassium: 3.9 mmol/L (ref 3.5–5.1)
Sodium: 138 mmol/L (ref 135–145)

## 2022-06-12 LAB — SEDIMENTATION RATE: Sed Rate: 1 mm/h (ref 0–22)

## 2022-06-12 MED ORDER — OXYCODONE-ACETAMINOPHEN 5-325 MG PO TABS
1.0000 | ORAL_TABLET | Freq: Once | ORAL | Status: AC
  Administered 2022-06-12: 1 via ORAL
  Filled 2022-06-12: qty 1

## 2022-06-12 NOTE — ED Notes (Signed)
Patient verbalizes understanding of discharge instructions. Opportunity for questioning and answers were provided. Armband removed by staff, pt discharged from ED.  

## 2022-06-12 NOTE — ED Triage Notes (Signed)
Patient here with complaint of thoracic back pain, history four back surgeries. Pt states the on call doctor told her to make an appointment but pt states her dr is on vacation and she cannot take the pain. She states she has chronic back pain but this is different so she know something has happened.

## 2022-06-12 NOTE — ED Provider Notes (Signed)
[facility] EMERGENCY DEPARTMENT Provider Note   CSN: [id number] Arrival date & time: 06/12/22  6010     History  Chief Complaint  Patient presents with   Back Pain    Erin Camacho is a 76 y.o. female.  The history is provided by the patient, a relative and medical records. No language interpreter was used.  Back Pain Location:  Thoracic spine Quality:  Stabbing and aching Radiates to:  Does not radiate Pain severity:  Severe Pain is:  Unable to specify Onset quality:  Gradual Duration:  4 days Timing:  Constant Progression:  Unchanged Chronicity:  New Relieved by:  Being still Worsened by:  Touching, twisting and standing Ineffective treatments:  None tried Associated symptoms: no abdominal pain, no abdominal swelling, no bladder incontinence, no bowel incontinence, no chest pain, no dysuria, no fever, no headaches, no numbness, no paresthesias, no perianal numbness, no tingling and no weakness        Home Medications Prior to Admission medications   Medication Sig Start Date End Date Taking? Authorizing Provider  acetaminophen (TYLENOL) 650 MG CR tablet Take 1,950 mg by mouth in the morning and at bedtime.    [provider]  ALPRAZolam Duanne Moron) 0.25 MG tablet Take 1 tablet (0.25 mg total) by mouth daily as needed for anxiety. 10/30/21   [name], MD  Azelastine HCl 137 MCG/SPRAY SOLN PLACE 1 SPRAY INTO BOTH NOSTRILS 2 (TWO) TIMES DAILY. USE IN EACH NOSTRIL AS DIRECTED 04/17/22   [name], MD  baclofen (LIORESAL) 10 MG tablet Take 10 mg by mouth 3 (three) times daily. 05/10/21   [provider]  cholecalciferol (VITAMIN D) 25 MCG (1000 UNIT) tablet Take 1,000 Units by mouth daily.    [provider]  Ferrous Sulfate (IRON) 325 (65 Fe) MG TABS Take 325 mg by mouth daily. 01/28/20   [provider]  fexofenadine (ALLEGRA) 180 MG tablet Take 1 tablet (180 mg total) by mouth daily. 01/18/14   Jackolyn Confer,  MD  fluticasone (FLONASE) 50 MCG/ACT nasal spray SPRAY 2 SPRAYS INTO EACH NOSTRIL EVERY DAY 07/07/19   [name], MD  losartan-hydrochlorothiazide [facility]) 50-12.5 MG tablet TAKE 1 TABLET BY MOUTH EVERY DAY 11/21/21   [name], MD  metFORMIN (GLUCOPHAGE) 500 MG tablet TAKE 1 TABLET BY MOUTH EVERY DAY 04/09/22   [name], MD  Multiple Vitamin (MULTIVITAMIN WITH MINERALS) TABS tablet Take 1 tablet by mouth daily.    [provider]  OneTouch Delica Lancets 93A MISC Use to check blood sugar x per day. DX E11.9. 03/16/21   [name], MD  [facility] VERIO test strip USE TO CHECK BLOOD SUGAR TWICE A DAY 11/21/21   [name], MD  pantoprazole (PROTONIX) 40 MG tablet TAKE 1 TABLET (40 MG TOTAL) BY MOUTH 2 (TWO) TIMES DAILY BEFORE A MEAL. 03/05/22   [name], MD  sertraline (ZOLOFT) 100 MG tablet TAKE 1/2 TABLET BY MOUTH (50 MG) BY MOUTH DAILY 03/19/22   Crecencio Mc, MD  simvastatin (ZOCOR) 10 MG tablet TAKE 1/2 TABLET (5 MG TOTAL) BY MOUTH AT BEDTIME. 03/26/22   [name], MD  traMADol (ULTRAM) 50 MG tablet Take 50 mg by mouth every 12 (twelve) hours as needed.    [provider]  traZODone (DESYREL) 100 MG tablet TAKE 1 TABLET BY MOUTH EVERYDAY AT BEDTIME 02/15/22   [name], MD      Allergies    Adhesive [tape]    Review of Systems  Review of Systems  Constitutional:  Negative for chills, diaphoresis, fatigue and fever.  HENT:  Negative for congestion.   Eyes:  Negative for visual disturbance.  Respiratory:  Negative for cough, chest tightness, shortness of breath and wheezing.   Cardiovascular:  Negative for chest pain and palpitations.  Gastrointestinal:  Negative for abdominal pain, bowel incontinence, constipation, diarrhea, nausea and vomiting.  Genitourinary:  Negative for bladder incontinence, dysuria, flank pain and frequency.  Musculoskeletal:  Positive for back pain. Negative for neck pain and neck stiffness.  Skin:  Negative  for rash and wound.  Neurological:  Negative for tingling, seizures, weakness, light-headedness, numbness, headaches and paresthesias.  Psychiatric/Behavioral:  Negative for agitation and confusion.   All other systems reviewed and are negative.   Physical Exam Updated Vital Signs BP (!) 146/76 (BP Location: Right Arm)   Pulse 67   Temp 98 F (36.7 C) (Oral)   Resp 16   Ht '5\' 5"'$  (1.651 m)   Wt 86.2 kg   SpO2 95%   BMI 31.62 kg/m  Physical Exam Vitals and nursing note reviewed.  Constitutional:      General: She is not in acute distress.    Appearance: She is well-developed. She is not ill-appearing, toxic-appearing or diaphoretic.  HENT:     Head: Normocephalic and atraumatic.     Nose: No congestion.     Mouth/Throat:     Mouth: Mucous membranes are moist.  Eyes:     Extraocular Movements: Extraocular movements intact.     Conjunctiva/sclera: Conjunctivae normal.     Pupils: Pupils are equal, round, and reactive to light.  Cardiovascular:     Rate and Rhythm: Normal rate and regular rhythm.     Heart sounds: No murmur heard. Pulmonary:     Effort: Pulmonary effort is normal. No respiratory distress.     Breath sounds: Normal breath sounds. No wheezing, rhonchi or rales.  Chest:     Chest wall: No tenderness.  Abdominal:     Palpations: Abdomen is soft.     Tenderness: There is no abdominal tenderness. There is no right CVA tenderness, left CVA tenderness, guarding or rebound.  Musculoskeletal:        General: Tenderness present. No swelling or signs of injury.     Cervical back: Neck supple.       Back:     Right lower leg: No edema.     Left lower leg: No edema.  Skin:    General: Skin is warm and dry.     Capillary Refill: Capillary refill takes less than 2 seconds.     Findings: No rash.  Neurological:     General: No focal deficit present.     Mental Status: She is alert.     Sensory: No sensory deficit.     Motor: No weakness.  Psychiatric:         Mood and Affect: Mood normal.    ED Results / Procedures / Treatments   Labs (all labs ordered are listed, but only abnormal results are displayed) Labs Reviewed  BASIC METABOLIC PANEL - Abnormal; Notable for the following components:      Result Value   Glucose, Bld 121 (*)    All other components within normal limits  CBC WITH DIFFERENTIAL/PLATELET  SEDIMENTATION RATE    EKG None  Radiology No results found.  Procedures Procedures    Medications Ordered in ED Medications  oxyCODONE-acetaminophen (PERCOCET/ROXICET) 5-325 MG per tablet 1 tablet (1 tablet Oral  Given 06/12/22 1355)    ED Course/ Medical Decision Making/ A&P                           Medical Decision Making Amount and/or Complexity of Data Reviewed Radiology: ordered.  Risk Prescription drug management.    TEREASA YILMAZ is a 76 y.o. female with a past medical history significant for reported for previous spinal surgeries managed by Dr. Ellene Route, hypertension, diabetes, chronic pain, hyperlipidemia, GERD, aortic atherosclerosis, and previous hiatal hernia who presents with worsened back pain.  According to patient, for the last 4 to 5 days she has had acute worsening of her thoracic back pain that feels like when she had a herniated disc in the past.  She denies any new trauma aside from a fall several weeks ago.  She reports the pain was not worse at that time and she was being managed.  She says that it started severely and now it is 10 out of 10 constantly but it does wax and wane depending on position and trying to move.  She denies any loss of bowel or bladder control or any numbness or weakness in her legs but reports this pain is worse and worse.    She denies fevers, chills, chest pain, shortness of breath, nausea, vomiting, constipation, or diarrhea.  Denies any urinary symptoms whatsoever.  She reports she came to the emergency department several days ago but due to the weight ended up leaving.  At  that time she had labs and CT imaging of her abdomen and pelvis as well as her spine.  The CT reports show no acute fracture but due to hardware did recommend MRI to further evaluate if needed due to the pain.  Patient returns today with continued pain.  On my exam, she does have lower thoracic spine tenderness primarily in the middle and on the right.  No laceration or skin changes seen.  Normal sensation, strength, and pulses in the legs.  No saddle numbness reported on exam.  The rest of exam unremarkable with clear breath sounds and nontender chest or abdomen.  Due to the CT scan recommending MRI several days ago and her worsened pain, will get MRI.  If MRI does not show any acute changes, anticipate discharge with plans to have her follow-up with her neurosurgical team sooner than the end of next month which is currently scheduled.  If there are concerning findings, dissipate discussion with neurosurgery.  Care transferred to oncoming team to await results of MRI.         Final Clinical Impression(s) / ED Diagnoses Final diagnoses:  Acute midline thoracic back pain     Clinical Impression: 1. Acute midline thoracic back pain     Disposition: Care transferred to oncoming team to await results of MRI.  This note was prepared with assistance of Systems analyst. Occasional wrong-word or sound-a-like substitutions may have occurred due to the inherent limitations of voice recognition software.     Joan Herschberger, Gwenyth Allegra, MD 06/12/22 [phone]

## 2022-06-12 NOTE — ED Notes (Signed)
Patient transported to MRI 

## 2022-06-12 NOTE — Discharge Instructions (Signed)
You were seen in the emergency department for worsening mid back pain.  Your CAT scan showed significant degenerative changes.  Please call your neurosurgical team for close follow-up.  Continue using pain medication as needed.  Return to the emergency department if any worsening or concerning symptoms.

## 2022-06-12 NOTE — ED Notes (Signed)
Pt is ambulatory to the bathroom with a steady gait. 

## 2022-06-12 NOTE — ED Notes (Signed)
Pt back from MRI 

## 2022-06-12 NOTE — ED Provider Triage Note (Signed)
Emergency Medicine Provider Triage Evaluation Note  Erin Camacho , a 76 y.o. female  was evaluated in triage.  Pt complains of thoracic back pain.  Slightly to the right of low T spine, the pain is constant but exacerbated by movement.  Is going on for the last 5 days, history of chronic back pain and 4 prior spinal surgeries.  Neurosurgeon cannot see her till September, seen 06/08/2022 in ED with negative renal stone work-up.  CT T spine and L spine witout acute findings.   Patient denies any chest pain or shortness of breath, no saddle anesthesia, urinary symptoms, bilateral lower extremity weakness.  No fevers.  No recent trauma or injury to the back..  Review of Systems  Per HPI  Physical Exam  BP (!) 146/76 (BP Location: Right Arm)   Pulse 67   Temp 98 F (36.7 C) (Oral)   Resp 16   Ht '5\' 5"'$  (1.651 m)   Wt 86.2 kg   SpO2 95%   BMI 31.62 kg/m  Gen:   Awake, no distress   Resp:  Normal effort  MSK:   Moves extremities without difficulty  Other:  Right paraspinal tenderness to the T-spine area.  No midline tenderness to T-spine or L-spine.  Surgical scar, no rashes.  Cranial nerves III through XII are grossly intact, grips and equal bilaterally.  Able to raise both lower extremities without difficulty.  Medical Decision Making  Medically screening exam initiated at 9:46 AM.  Appropriate orders placed.  MIQUELA COSTABILE was informed that the remainder of the evaluation will be completed by another provider, this initial triage assessment does not replace that evaluation, and the importance of remaining in the ED until their evaluation is complete.     Sherrill Raring, [place] 06/12/22 [phone]

## 2022-06-12 NOTE — ED Provider Notes (Signed)
 Signout from Dr. Cleotis.  76 year old female with severe mid back pain mostly under her right scapula.  4 days ago she had a CT renal along with CT thoracic and lumbar spine.  Coming back for worsening symptoms Physical Exam  BP (!) 146/76 (BP Location: Right Arm)   Pulse 67   Temp 98 F (36.7 C) (Oral)   Resp 16   Ht 5' 5 (1.651 m)   Wt 86.2 kg   SpO2 95%   BMI 31.62 kg/m   Physical Exam  Procedures  Procedures  ED Course / MDM    Medical Decision Making Amount and/or Complexity of Data Reviewed Radiology: ordered.  Risk Prescription drug management.   Plan is to follow-up on MRI of the thoracic spine.  MRI showing significant degenerative changes.  Paged neurosurgery Dr. Lanis to review.  Have not heard back in over an hour and patient unwilling to wait any longer.  She will follow-up outpatient with [place] [facility].       Erin Ozell BROCKS, MD 06/13/22 [phone]

## 2022-06-13 ENCOUNTER — Telehealth: Payer: Self-pay

## 2022-06-14 NOTE — Telephone Encounter (Signed)
error 

## 2022-06-15 ENCOUNTER — Emergency Department (HOSPITAL_COMMUNITY)
Admission: EM | Admit: 2022-06-15 | Discharge: 2022-06-15 | Disposition: A | Discharge status: Home / Self Care | Payer: Medicare HMO | Attending: Emergency Medicine | Admitting: Emergency Medicine

## 2022-06-15 ENCOUNTER — Other Ambulatory Visit: Payer: Self-pay

## 2022-06-15 ENCOUNTER — Encounter (HOSPITAL_COMMUNITY): Payer: Self-pay | Admitting: Emergency Medicine

## 2022-06-15 DIAGNOSIS — M549 Dorsalgia, unspecified: Secondary | ICD-10-CM | POA: Diagnosis not present

## 2022-06-15 DIAGNOSIS — E119 Type 2 diabetes mellitus without complications: Secondary | ICD-10-CM | POA: Diagnosis not present

## 2022-06-15 DIAGNOSIS — I10 Essential (primary) hypertension: Secondary | ICD-10-CM | POA: Diagnosis not present

## 2022-06-15 DIAGNOSIS — Z7984 Long term (current) use of oral hypoglycemic drugs: Secondary | ICD-10-CM | POA: Insufficient documentation

## 2022-06-15 DIAGNOSIS — Z79899 Other long term (current) drug therapy: Secondary | ICD-10-CM | POA: Insufficient documentation

## 2022-06-15 DIAGNOSIS — M546 Pain in thoracic spine: Secondary | ICD-10-CM | POA: Diagnosis not present

## 2022-06-15 MED ORDER — TRAMADOL HCL 50 MG PO TABS: ORAL_TABLET | ORAL | 0 refills | Status: AC

## 2022-06-15 MED ORDER — DEXAMETHASONE SODIUM PHOSPHATE 10 MG/ML IJ SOLN
10.0000 mg | Freq: Once | INTRAMUSCULAR | Status: AC
  Administered 2022-06-15: 10 mg via INTRAMUSCULAR
  Filled 2022-06-15: qty 1

## 2022-06-15 MED ORDER — MORPHINE SULFATE (PF) 4 MG/ML IV SOLN
4.0000 mg | Freq: Once | INTRAVENOUS | Status: AC
  Administered 2022-06-15: 4 mg via INTRAMUSCULAR
  Filled 2022-06-15: qty 1

## 2022-06-15 NOTE — ED Triage Notes (Signed)
Patient with requesting assistance with pain control of back pain. Patient states she took baclofen and tramadol at home with no relief. Patient is schedule to see her physician to discuss surgical options to address back pain but appointment has not yet occurred.

## 2022-06-15 NOTE — ED Provider Notes (Signed)
[facility] EMERGENCY DEPARTMENT Provider Note   CSN: [id number] Arrival date & time: 06/15/22  1501     History  Chief Complaint  Patient presents with   Back Pain    Erin Camacho is a 76 y.o. female who presents the emergency department complaining of continued back pain.  Patient was seen at this facility twice recently, most recently on 7/25 for similar symptoms.  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  She states that she has not yet gotten an appointment because the surgeon has been out of town.  Today upon getting out of bed she had more severe pain in the same area as before.  No other associated symptoms.  She has been taking tramadol and baclofen with decent relief, up until today.  She has been taking 100 mg of tramadol in the morning, and then in the late afternoon/evening been taking 50 mg tramadol and 1 baclofen.  She presents today requesting assistance with pain control until she can see neurosurgery hopefully next week.  Denies any inciting event leading to increased pain.  She denies any sharp shooting pain, numbness or tingling, urinary retention or urine or bowel incontinence.  No saddle paresthesias.   Back Pain Associated symptoms: no numbness and no weakness        Home Medications Prior to Admission medications   Medication Sig Start Date End Date Taking? Authorizing Provider  acetaminophen (TYLENOL) 650 MG CR tablet Take 1,950 mg by mouth in the morning and at bedtime.    [provider]  ALPRAZolam Duanne Moron) 0.25 MG tablet Take 1 tablet (0.25 mg total) by mouth daily as needed for anxiety. 10/30/21   [name], MD  Azelastine HCl 137 MCG/SPRAY SOLN PLACE 1 SPRAY INTO BOTH NOSTRILS 2 (TWO) TIMES DAILY. USE IN EACH NOSTRIL AS DIRECTED 04/17/22   [name], MD  baclofen (LIORESAL) 10 MG tablet Take 10 mg by mouth 3 (three)  times daily. 05/10/21   [provider]  cholecalciferol (VITAMIN D) 25 MCG (1000 UNIT) tablet Take 1,000 Units by mouth daily.    [provider]  Ferrous Sulfate (IRON) 325 (65 Fe) MG TABS Take 325 mg by mouth daily. 01/28/20   [provider]  fexofenadine (ALLEGRA) 180 MG tablet Take 1 tablet (180 mg total) by mouth daily. 01/18/14   Jackolyn Confer, MD  fluticasone (FLONASE) 50 MCG/ACT nasal spray SPRAY 2 SPRAYS INTO EACH NOSTRIL EVERY DAY 07/07/19   [name], MD  losartan-hydrochlorothiazide [facility]) 50-12.5 MG tablet TAKE 1 TABLET BY MOUTH EVERY DAY 11/21/21   [name], MD  metFORMIN (GLUCOPHAGE) 500 MG tablet TAKE 1 TABLET BY MOUTH EVERY DAY 04/09/22   [name], MD  Multiple Vitamin (MULTIVITAMIN WITH MINERALS) TABS tablet Take 1 tablet by mouth daily.    [provider]  OneTouch Delica Lancets 75Z MISC Use to check blood sugar x per day. DX E11.9. 03/16/21   [name], MD  [facility] VERIO test strip USE TO CHECK BLOOD SUGAR TWICE A DAY 11/21/21   [name], MD  pantoprazole (PROTONIX) 40 MG tablet TAKE 1 TABLET (40 MG TOTAL) BY MOUTH 2 (TWO) TIMES DAILY BEFORE A MEAL. 03/05/22   [name], MD  sertraline (ZOLOFT) 100 MG tablet TAKE 1/2 TABLET BY MOUTH (50 MG) BY MOUTH DAILY 03/19/22   Crecencio Mc, MD  simvastatin (ZOCOR) 10 MG tablet TAKE  1/2 TABLET (5 MG TOTAL) BY MOUTH AT BEDTIME. 03/26/22   [name], MD  traMADol (ULTRAM) 50 MG tablet Take 1-2 tablets as needed for severe pain. Not to exceed two doses (4 tablets, 200 mg) daily 06/15/22   Kona Yusuf T, PA-C      Allergies    Adhesive [tape]    Review of Systems   Review of Systems  Genitourinary:  Negative for decreased urine volume and difficulty urinating.  Musculoskeletal:  Positive for back pain.  Neurological:  Negative for weakness and numbness.  All other systems reviewed and are negative.   Physical Exam Updated Vital Signs BP (!) 147/68 (BP  Location: Left Arm)   Pulse 61   Temp 98.5 F (36.9 C) (Oral)   Resp 14   SpO2 98%  Physical Exam Vitals and nursing note reviewed.  Constitutional:      Appearance: Normal appearance.  HENT:     Head: Normocephalic and atraumatic.  Eyes:     Conjunctiva/sclera: Conjunctivae normal.  Pulmonary:     Effort: Pulmonary effort is normal. No respiratory distress.  Musculoskeletal:     Comments: Tenderness to palpation of her mid thoracic spine without step-offs or crepitus.  Normal sensation in all extremities.  5/5 strength in all extremities.  Skin:    General: Skin is warm and dry.  Neurological:     Mental Status: She is alert.  Psychiatric:        Mood and Affect: Mood normal.        Behavior: Behavior normal.     ED Results / Procedures / Treatments   Labs (all labs ordered are listed, but only abnormal results are displayed) Labs Reviewed - No data to display  EKG None  Radiology No results found.  Procedures Procedures    Medications Ordered in ED Medications  morphine (PF) 4 MG/ML injection 4 mg (has no administration in time range)  dexamethasone (DECADRON) injection 10 mg (has no administration in time range)    ED Course/ Medical Decision Making/ A&P                           Medical Decision Making Risk Prescription drug management.  This patient is a 76 y.o. female  who presents to the ED for concern of ongoing mid back pain.   Past Medical History / Co-morbidities: Arthritis, GERD, hypertension, hyperlipidemia, spinal stenosis, chronic back pain, non-insulin-dependent type 2 diabetes  Additional history: Chart reviewed. Pertinent results include: Patient seen on 7/21 and 7/25 for similar symptoms.  Had an MRI performed on 7/25 that showed progressive degenerative disc disease through T7-T10, right-sided facet arthrosis from T5-T9.  New small right disc protrusion at T2-3 without stenosis.  Normal spinal cord signal.  Physical Exam: Physical  exam performed. The pertinent findings include: Midline tenderness palpation of the mid thoracic spine, without step-offs or crepitus.  Normal strength and sensation in all extremities.  Medications: I ordered medication including steroids and morphine.  I have reviewed the patients home medicines and have made adjustments as needed.   Disposition: After consideration of the diagnostic results and the patients response to treatment, I feel that emergency department workup does not suggest an emergent condition requiring admission or immediate intervention beyond what has been performed at this time. The plan is: Discharged home with refill of tramadol.  As patient had no other event that could have caused a change in her degenerative disc disease, I have very  low suspicion for progression and therefore discussed with her that I do not think imaging would be very beneficial today.  She was requesting intramuscular injections today, so I offered 1 for pain control as well as long-acting steroid.  She has had good success on tramadol, and is requesting a refill of this.  I think this is reasonable.  We will send additional referral to [place] [facility] to assist in getting patient an appointment.  The patient is safe for discharge and has been instructed to return immediately for worsening symptoms, change in symptoms or any other concerns.         Final Clinical Impression(s) / ED Diagnoses Final diagnoses:  Acute midline thoracic back pain    Rx / DC Orders ED Discharge Orders          Ordered    traMADol (ULTRAM) 50 MG tablet        06/15/22 1652    Ambulatory referral to Neurosurgery       Comments: Thoracic degenerative disc disease on MRI, has failed conservative management   06/15/22 1652           Portions of this report may have been transcribed using voice recognition software. Every effort was made to ensure accuracy; however, inadvertent computerized transcription  errors may be present.    Estill Cotta 06/15/22 1746    Sherwood Gambler, MD 06/18/22 1504

## 2022-06-15 NOTE — ED Provider Triage Note (Signed)
Emergency Medicine Provider Triage Evaluation Note  Erin Camacho , a 76 y.o. female  was evaluated in triage.  Pt complains of back pain. States that same is a chronic issue and she just had an MRI for same. She has been taking Baclofen and Tramadol with no relief. States that she is supposed to meet with a surgeon next week to discuss management of her symptoms but doesn't feel like she can wait that long. She is still ambulatory, denies any sharp shooting pain or numbness/tingling. No loss of bowel/bladder function or saddle paraesthesias.  Review of Systems  Positive:  Negative:   Physical Exam  BP (!) 155/69 (BP Location: Left Arm)   Pulse 76   Temp 99.1 F (37.3 C) (Oral)   Resp 18   SpO2 98%  Gen:   Awake, no distress   Resp:  Normal effort  MSK:   Moves extremities without difficulty  Other:  Tenderness to palpation over the mid-right thoracic spine   Medical Decision Making  Medically screening exam initiated at 3:28 PM.  Appropriate orders placed.  Erin Camacho was informed that the remainder of the evaluation will be completed by another provider, this initial triage assessment does not replace that evaluation, and the importance of remaining in the ED until their evaluation is complete.     Bud Face, PA-C 06/15/22 9179

## 2022-06-15 NOTE — Discharge Instructions (Addendum)
You were seen in the emergency department for continued back pain.  As we discussed I think that you would benefit from some steroids prior to seeing the neurosurgeon, so we gave you a dose here. Have also sent a referral to their office for you.  We discussed different pain medications, and you elected to stay on your current regimen of tramadol, and have sent a refill of this.  Continue to monitor how you're doing and return to the ER for new or worsening symptoms.

## 2022-06-19 DIAGNOSIS — J301 Allergic rhinitis due to pollen: Secondary | ICD-10-CM | POA: Diagnosis not present

## 2022-06-19 DIAGNOSIS — M5104 Intervertebral disc disorders with myelopathy, thoracic region: Secondary | ICD-10-CM | POA: Diagnosis not present

## 2022-06-20 ENCOUNTER — Ambulatory Visit
Admission: EM | Admit: 2022-06-20 | Discharge: 2022-06-20 | Disposition: A | Discharge status: Home / Self Care | Payer: Medicare HMO | Attending: Emergency Medicine | Admitting: Emergency Medicine

## 2022-06-20 ENCOUNTER — Telehealth: Payer: Self-pay | Admitting: Internal Medicine

## 2022-06-20 ENCOUNTER — Other Ambulatory Visit: Payer: Self-pay

## 2022-06-20 ENCOUNTER — Encounter: Payer: Self-pay | Admitting: Emergency Medicine

## 2022-06-20 ENCOUNTER — Encounter
Admission: EM | Disposition: A | Discharge status: Home / Self Care | Payer: Self-pay | Source: Home / Self Care | Attending: Emergency Medicine

## 2022-06-20 ENCOUNTER — Emergency Department: Payer: Medicare HMO | Admitting: Anesthesiology

## 2022-06-20 DIAGNOSIS — K2289 Other specified disease of esophagus: Secondary | ICD-10-CM | POA: Diagnosis not present

## 2022-06-20 DIAGNOSIS — E119 Type 2 diabetes mellitus without complications: Secondary | ICD-10-CM | POA: Diagnosis not present

## 2022-06-20 DIAGNOSIS — Z9884 Bariatric surgery status: Secondary | ICD-10-CM | POA: Diagnosis not present

## 2022-06-20 DIAGNOSIS — T18108A Unspecified foreign body in esophagus causing other injury, initial encounter: Secondary | ICD-10-CM | POA: Diagnosis not present

## 2022-06-20 DIAGNOSIS — K222 Esophageal obstruction: Secondary | ICD-10-CM | POA: Insufficient documentation

## 2022-06-20 DIAGNOSIS — T189XXA Foreign body of alimentary tract, part unspecified, initial encounter: Secondary | ICD-10-CM | POA: Diagnosis not present

## 2022-06-20 DIAGNOSIS — R1310 Dysphagia, unspecified: Secondary | ICD-10-CM | POA: Insufficient documentation

## 2022-06-20 DIAGNOSIS — I10 Essential (primary) hypertension: Secondary | ICD-10-CM | POA: Insufficient documentation

## 2022-06-20 DIAGNOSIS — K3189 Other diseases of stomach and duodenum: Secondary | ICD-10-CM | POA: Diagnosis not present

## 2022-06-20 DIAGNOSIS — K219 Gastro-esophageal reflux disease without esophagitis: Secondary | ICD-10-CM | POA: Insufficient documentation

## 2022-06-20 DIAGNOSIS — Z7984 Long term (current) use of oral hypoglycemic drugs: Secondary | ICD-10-CM | POA: Diagnosis not present

## 2022-06-20 DIAGNOSIS — R69 Illness, unspecified: Secondary | ICD-10-CM | POA: Diagnosis not present

## 2022-06-20 HISTORY — PX: ESOPHAGOGASTRODUODENOSCOPY: SHX5428

## 2022-06-20 HISTORY — PX: UPPER GI ENDOSCOPY: SHX6162

## 2022-06-20 LAB — BASIC METABOLIC PANEL WITH GFR
Anion gap: 11 (ref 5–15)
BUN: 19 mg/dL (ref 8–23)
CO2: 23 mmol/L (ref 22–32)
Calcium: 10 mg/dL (ref 8.9–10.3)
Chloride: 104 mmol/L (ref 98–111)
Creatinine, Ser: 0.79 mg/dL (ref 0.44–1.00)
GFR, Estimated: >60 mL/min
Glucose, Bld: 97 mg/dL (ref 70–99)
Potassium: 3.3 mmol/L — ABNORMAL LOW (ref 3.5–5.1)
Sodium: 138 mmol/L (ref 135–145)

## 2022-06-20 LAB — CBC WITH DIFFERENTIAL/PLATELET
Abs Immature Granulocytes: 0.01 10*3/uL (ref 0.00–0.07)
Basophils Absolute: 0 10*3/uL (ref 0.0–0.1)
Basophils Relative: 1 %
Eosinophils Absolute: 0.3 10*3/uL (ref 0.0–0.5)
Eosinophils Relative: 7 %
HCT: 40.2 % (ref 36.0–46.0)
Hemoglobin: 13 g/dL (ref 12.0–15.0)
Immature Granulocytes: 0 %
Lymphocytes Relative: 38 %
Lymphs Abs: 1.9 10*3/uL (ref 0.7–4.0)
MCH: 31 pg (ref 26.0–34.0)
MCHC: 32.3 g/dL (ref 30.0–36.0)
MCV: 95.9 fL (ref 80.0–100.0)
Monocytes Absolute: 0.4 10*3/uL (ref 0.1–1.0)
Monocytes Relative: 9 %
Neutro Abs: 2.2 10*3/uL (ref 1.7–7.7)
Neutrophils Relative %: 45 %
Platelets: 183 10*3/uL (ref 150–400)
RBC: 4.19 MIL/uL (ref 3.87–5.11)
RDW: 12.5 % (ref 11.5–15.5)
WBC: 4.9 10*3/uL (ref 4.0–10.5)
nRBC: 0 % (ref 0.0–0.2)

## 2022-06-20 LAB — CBG MONITORING, ED: Glucose-Capillary: 83 mg/dL (ref 70–99)

## 2022-06-20 LAB — LIPASE, BLOOD: Lipase: 23 U/L (ref 11–51)

## 2022-06-20 SURGERY — ESOPHAGOGASTRODUODENOSCOPY (EGD)
Anesthesia: General

## 2022-06-20 MED ORDER — SODIUM CHLORIDE 0.9 % IV SOLN: INTRAVENOUS | Status: DC | PRN

## 2022-06-20 MED ORDER — PROPOFOL 10 MG/ML IV BOLUS
INTRAVENOUS | Status: AC
  Filled 2022-06-20: qty 20

## 2022-06-20 MED ORDER — ESMOLOL HCL 100 MG/10ML IV SOLN
INTRAVENOUS | Status: AC
  Filled 2022-06-20: qty 10

## 2022-06-20 MED ORDER — GLUCAGON HCL RDNA (DIAGNOSTIC) 1 MG IJ SOLR
1.0000 mg | Freq: Once | INTRAMUSCULAR | Status: AC
  Administered 2022-06-20: 1 mg via INTRAVENOUS
  Filled 2022-06-20: qty 1

## 2022-06-20 MED ORDER — LIDOCAINE HCL (PF) 2 % IJ SOLN
INTRAMUSCULAR | Status: AC
  Filled 2022-06-20: qty 5

## 2022-06-20 MED ORDER — PHENYLEPHRINE 80 MCG/ML (10ML) SYRINGE FOR IV PUSH (FOR BLOOD PRESSURE SUPPORT)
PREFILLED_SYRINGE | INTRAVENOUS | Status: AC
  Filled 2022-06-20: qty 10

## 2022-06-20 MED ORDER — ESMOLOL HCL 100 MG/10ML IV SOLN
INTRAVENOUS | Status: DC | PRN
  Administered 2022-06-20: 5 mg via INTRAVENOUS
  Administered 2022-06-20: 10 mg via INTRAVENOUS

## 2022-06-20 MED ORDER — SODIUM CHLORIDE 0.9 % IV BOLUS
1000.0000 mL | Freq: Once | INTRAVENOUS | Status: AC
  Administered 2022-06-20: 1000 mL via INTRAVENOUS

## 2022-06-20 MED ORDER — SODIUM CHLORIDE 0.9 % IV SOLN: INTRAVENOUS | Status: DC

## 2022-06-20 MED ORDER — SUCCINYLCHOLINE CHLORIDE 200 MG/10ML IV SOSY
PREFILLED_SYRINGE | INTRAVENOUS | Status: AC
  Filled 2022-06-20: qty 10

## 2022-06-20 NOTE — ED Provider Notes (Addendum)
[facility] Provider Note    Event Date/Time   First MD Initiated Contact with Patient 06/20/22 1745     (approximate)  History   Chief Complaint: Dysphagia  HPI  Erin Camacho is a 76 y.o. female with a past medical history of anxiety, arthritis, gastric reflux, hypertension, hyperlipidemia, diabetes, presents to the emergency department with difficulty swallowing.  Patient states she has a history of a Lap-Band procedure back in 2004 and at times specially if she eats too fast she will have to vomit and then she can swallow again.  She states since eating a beef stew last night she has not been able to swallow any food.  States if she drinks liquids she feels like it gets stuck but then slowly goes down but if she tries to eat food it comes right back up.  Patient denies any trouble breathing.  Denies any cough or aspiration.  Physical Exam   Triage Vital Signs: ED Triage Vitals  Enc Vitals Group     BP 06/20/22 1500 (!) 175/80     Pulse Rate 06/20/22 1500 71     Resp 06/20/22 1500 16     Temp 06/20/22 1500 98.5 F (36.9 C)     Temp Source 06/20/22 1500 Oral     SpO2 06/20/22 1500 100 %     Weight 06/20/22 1501 190 lb (86.2 kg)     Height 06/20/22 1501 '5\' 5"'$  (1.651 m)     Head Circumference --      Peak Flow --      Pain Score 06/20/22 1501 2     Pain Loc --      Pain Edu? --      Excl. in [place]? --     Most recent vital signs: Vitals:   06/20/22 1500  BP: (!) 175/80  Pulse: 71  Resp: 16  Temp: 98.5 F (36.9 C)  SpO2: 100%    General: Awake, no distress.  CV:  Good peripheral perfusion.  Regular rate and rhythm  Resp:  Normal effort.  Equal breath sounds bilaterally.  Abd:  No distention.  Soft, nontender.  No rebound or guarding.  ED Results / Procedures / Treatments   MEDICATIONS ORDERED IN ED: Medications  glucagon (GLUCAGEN) injection 1 mg (has no administration in time range)  sodium chloride 0.9 % bolus 1,000 mL (has no  administration in time range)     IMPRESSION / MDM / ASSESSMENT AND PLAN / ED COURSE  I reviewed the triage vital signs and the nursing notes.  Patient's presentation is most consistent with acute presentation with potential threat to life or bodily function.  Patient presents emergency department being unable to swallow food.  Differential would include esophageal obstruction, esophageal stricture, esophageal mass, among other possibilities.  Patient's basic labs are largely within normal limits including a reassuring chemistry negative lipase and normal CBC.  We will dose glucagon and IV fluids given the patient's slightly elevated anion gap.  We will attempt p.o. trial after glucagon.  If the patient still unable to swallow food we will consult with GI medicine for consultation.  Patient felt like the obstruction may have dislodged give the patient water she attempted to take several sips in the water immediately came back up.  I spoke to Dr. Alice Reichert of GI medicine who will be in to see the patient.  Patient agreeable to plan.  FINAL CLINICAL IMPRESSION(S) / ED DIAGNOSES   Esophageal obstruction  Note:  This document was prepared using Dragon voice recognition software and may include unintentional dictation errors.   Harvest Dark, MD 06/20/22 Artist Pais    Harvest Dark, MD 06/20/22 [phone]

## 2022-06-20 NOTE — Consult Note (Signed)
  [facility] GI Inpatient Consult Note   Erin Camacho, M.D.  Reason for Consult: Esophageal dysphagia, Esophageal foreign body   Attending Requesting Consult: Kevin Paduchowski, M.D. (ED)  Outpatient Primary Physician: Charlene Scott, M.D.  History of Present Illness: Erin Camacho is a 76 y.o. female status post laparoscopic band procedure in 2004, status post hiatal hernia repair in 2007 presenting for a 24-hour complaint of dysphagia to all liquids and solids.  Patient reports eating a casserole almost 24 hours ago and being unable to pass the food into the stomach area.  Erin Camacho feels mild to moderate pressure in the neck region add just about the level of the suprasternal notch or slightly below.  Erin Camacho is able to hold down secretions but is unable to pass any meaningful amount of liquids or solids.  Erin Camacho was seen in the emergency room a few hours ago and given 1 mg of IV glucagon which seemed to help the symptoms of pressure but still, the patient was unable to swallow liquids and they were spit out shortly after swallowed.  GI service has been called regarding likely foreign body in the esophagus and need for endoscopic food clearance. Patient denies frequent GERD, hematemesis, abdominal pain, fever, shortness of breath, cough or chest pain.  Patient does not take any anticoagulants at home.  Erin Camacho has undergone previous abdominal hysterectomy.  Patient takes chronic opioids (tramadol) for back pain.  Past Medical History:  Past Medical History:  Diagnosis Date   Allergy    hay fever   Anxiety    Arthritis    Chronic back pain    Diabetes mellitus    Dyspnea    a. 07/2017 Echo: EF 60-65%. No rwma. Mildly dil LA. Nl RV fxn.   GERD (gastroesophageal reflux disease)    History of hiatal hernia    Hyperlipidemia    Hypertension    Osteoarthritis    Pneumonia    Spinal stenosis    Type 2 diabetes mellitus (HCC)    UTI (lower urinary tract infection)     Problem  List: Patient Active Problem List   Diagnosis Date Noted   Bilateral carotid artery stenosis 01/31/2022   Balance problem 01/31/2022   History of COVID-19 11/25/2021   Fall 05/22/2021   Herniated nucleus pulposus with myelopathy, thoracic 12/29/2020   Low back pain 09/15/2020   Opioid use (10 MME/day) 05/16/2020   Hypokalemia 04/28/2020   Chronic pain syndrome 04/20/2020   Pharmacologic therapy 04/20/2020   Disorder of skeletal system 04/20/2020   Problems influencing health status 04/20/2020   Chronic thoracic spine pain (Bilateral) (R>L) 04/20/2020   Myofascial pain syndrome of thoracic spine 04/20/2020   Chronic musculoskeletal pain 04/20/2020   Spasm of thoracic back muscle 04/20/2020   Weight loss 12/31/2019   History of total hip replacement (Left) 08/31/2019   Early satiety 07/05/2019   Lumbar stenosis with neurogenic claudication 09/15/2018   Shortness of breath 06/19/2018   Cough 04/13/2018   Aortic atherosclerosis (HCC) 01/11/2018   GERD (gastroesophageal reflux disease) 12/23/2016   Atrophic vaginitis 03/18/2016   Spondylolisthesis at L5-S1 level 12/06/2015   Rhinitis, allergic 11/22/2015   Other abnormal glucose 11/29/2014   Right leg pain 07/27/2014   Neuritis or radiculitis due to rupture of lumbar intervertebral disc 06/28/2014   Chronic shoulder pain (Bilateral) 02/24/2014   Skin lesion 01/18/2014   History of bariatric surgery 01/18/2014   Status post bariatric surgery 01/18/2014   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     Depression, major, single episode, mild ([place]) 05/14/2013   Major depressive disorder, single episode 05/14/2013   Healthcare maintenance 01/07/2013   Sinusitis 12/08/2012   Hypercholesterolemia 08/22/2012   Hyperlipidemia 08/22/2012   Hypertension 07/07/2012   Diabetes mellitus type 2, diet-controlled ([place]) 07/07/2012   Insomnia 07/07/2012   Obesity (BMI 30-39.9) 07/07/2012   Essential (primary) hypertension 07/07/2012    Chronic low back pain (1ry area of Pain) (Bilateral) (R>L) w/o sciatica 10/18/2011    Past Surgical History: Past Surgical History:  Procedure Laterality Date   ABDOMINAL HYSTERECTOMY  1973   ABDOMINAL HYSTERECTOMY     APPENDECTOMY     APPLICATION OF ROBOTIC ASSISTANCE FOR SPINAL PROCEDURE N/A 09/15/2018   Procedure: APPLICATION OF ROBOTIC ASSISTANCE FOR SPINAL PROCEDURE;  Surgeon: Kristeen Miss, MD;  Location: [place];  Service: Neurosurgery;  Laterality: N/A;   BACK SURGERY  04/17/13, 2017, 2019   CATARACT EXTRACTION W/PHACO Left 12/16/2019   Procedure: CATARACT EXTRACTION PHACO AND INTRAOCULAR LENS PLACEMENT (IOC) LEFT PANOPTIX LENS DIABETIC 8.07 01:19.2 10.2%;  Surgeon: Leandrew Koyanagi, MD;  Location: [place];  Service: Ophthalmology;  Laterality: Left;  Diabetic - oral meds   CATARACT EXTRACTION W/PHACO Right 01/06/2020   Procedure: CATARACT EXTRACTION PHACO AND INTRAOCULAR LENS PLACEMENT (IOC) PANOPTIX LENS RIGHT DIABETIC 10.73 01:08.6 15.7%;  Surgeon: Leandrew Koyanagi, MD;  Location: [place];  Service: Ophthalmology;  Laterality: Right;  diabetic   CHOLECYSTECTOMY  1995   COLONOSCOPY WITH PROPOFOL N/A 03/15/2021   Procedure: COLONOSCOPY WITH PROPOFOL;  Surgeon: Robert Bellow, MD;  Location: [facility];  Service: Endoscopy;  Laterality: N/A;   EYE SURGERY  2021   bilateral cataract removal with lens implants   HERNIA REPAIR  08/2006   lap band  08/2003   LUMBAR LAMINECTOMY/ DECOMPRESSION WITH MET-RX Left 12/29/2020   Procedure: Left Thoracic Seven-Eight Microdiscectomy;  Surgeon: Kristeen Miss, MD;  Location: [place];  Service: Neurosurgery;  Laterality: Left;   SEPTOPLASTY  04/1978   TONSILLECTOMY AND ADENOIDECTOMY  1979   TOTAL HIP ARTHROPLASTY  02/2012   Right, Dr. Marry Guan   TOTAL HIP ARTHROPLASTY Left 08/31/2019   Procedure: TOTAL HIP ARTHROPLASTY;  Surgeon: Dereck Leep, MD;  Location: [facility];  Service: Orthopedics;  Laterality: Left;     Allergies: Allergies  Allergen Reactions   Adhesive [Tape] Itching    Honeycomb bandage - redness/itching    Home Medications: (Not in a hospital admission)  Home medication reconciliation was completed with the patient.   Scheduled Inpatient Medications:    Continuous Inpatient Infusions:    PRN Inpatient Medications:    Family History: family history includes Arthritis in her sister; Breast cancer (age of onset: 45) in an other family member; Cancer in her sister and another family member; Dementia in her brother and brother; Diabetes in her brother, brother, sister, and another family member; Heart disease in her father and mother; Hypertension in her brother, brother, father, and mother; Stroke in her father and mother.   GI Family History: Negative  Social History:   reports that Erin Camacho has never smoked. Erin Camacho has never used smokeless tobacco. Erin Camacho reports current alcohol use. Erin Camacho reports that Erin Camacho does not use drugs. The patient denies ETOH, tobacco, or drug use.    Review of Systems: Review of Systems - Negative except that in HPI.  Physical Examination: BP (!) 192/80 (BP Location: Right Arm)   Pulse 61   Temp 98.5 F (36.9 C) (Oral)   Resp 17   Ht 5' 5" (1.651  m)   Wt 86.2 kg   SpO2 100%   BMI 31.62 kg/m  Physical Exam Vitals reviewed.  Constitutional:      General: Erin Camacho is not in acute distress.    Appearance: Normal appearance. Erin Camacho is not ill-appearing, toxic-appearing or diaphoretic.  HENT:     Head: Normocephalic and atraumatic.     Nose: Nose normal.  Eyes:     General: No scleral icterus.    Extraocular Movements: Extraocular movements intact.     Conjunctiva/sclera: Conjunctivae normal.     Pupils: Pupils are equal, round, and reactive to light.  Cardiovascular:     Rate and Rhythm: Normal rate.     Pulses: Normal pulses.  Pulmonary:     Effort: Pulmonary effort is normal. No respiratory distress.     Breath sounds: Normal breath sounds. No  stridor. No wheezing or rhonchi.  Abdominal:     General: There is no distension.     Palpations: Abdomen is soft. There is no mass.     Tenderness: There is no abdominal tenderness. There is no guarding or rebound.  Musculoskeletal:        General: Normal range of motion.     Cervical back: Normal range of motion and neck supple. No rigidity or tenderness.  Skin:    General: Skin is warm and dry.     Findings: No rash.  Neurological:     General: No focal deficit present.     Mental Status: Erin Camacho is alert and oriented to person, place, and time.  Psychiatric:        Mood and Affect: Mood normal.        Thought Content: Thought content normal.        Judgment: Judgment normal.     Data: Lab Results  Component Value Date   WBC 4.9 06/20/2022   HGB 13.0 06/20/2022   HCT 40.2 06/20/2022   MCV 95.9 06/20/2022   PLT 183 06/20/2022   Recent Labs  Lab 06/20/22 1519  HGB 13.0   Lab Results  Component Value Date   NA 138 06/20/2022   K 3.3 (L) 06/20/2022   CL 104 06/20/2022   CO2 23 06/20/2022   BUN 19 06/20/2022   CREATININE 0.79 06/20/2022   Lab Results  Component Value Date   ALT 12 04/12/2022   AST 18 04/12/2022   ALKPHOS 70 04/12/2022   BILITOT 0.9 04/12/2022   No results for input(s): "APTT", "INR", "PTT" in the last 168 hours.    Latest Ref Rng & Units 06/20/2022    3:19 PM 06/12/2022    9:48 AM 06/08/2022   10:08 AM  CBC  WBC 4.0 - 10.5 K/uL 4.9  5.1  4.6   Hemoglobin 12.0 - 15.0 g/dL 13.0  12.6  12.9   Hematocrit 36.0 - 46.0 % 40.2  39.2  39.0   Platelets 150 - 400 K/uL 183  184  190     STUDIES: No results found. @IMAGES@  Assessment: Persistent esophageal dysphagia secondary to esophageal food impaction. History of laparascopic banding procedure (LAP-BAND). 2004. History of Hiatal hernia repair. 2007.  COVID-19 status: Not tested/Low or no suspicion for COVID-19   Recommendations:  Prompt EGD tonight. The patient understands the nature of the  planned procedure, indications, risks, alternatives and potential complications including but not limited to bleeding, infection, perforation, damage to internal organs and possible oversedation/side effects from anesthesia. The patient agrees and gives consent to proceed.  Please refer to procedure notes   for findings, recommendations and patient disposition/instructions.   Thank you for the consult. Please call with questions or concerns.  Olean Ree, "Lanny Hurst MD [facility] [place], [place] [address] [phone]  06/20/2022 8:17 PM

## 2022-06-20 NOTE — ED Provider Triage Note (Signed)
Emergency Medicine Provider Triage Evaluation Note  Erin Camacho, a 76 y.o. female  was evaluated in triage.  Pt complains of difficulty swallowing.  She reports that beginning yesterday she began to experience fullness in her swallowing to and began to vomit up undigested food.  She denies any difficulty breathing, controlling oral secretions, or shortness of breath.  Review of Systems  Positive: Dysphagia and food bolus Negative: Chest pain, shortness of breath  Physical Exam  BP (!) 175/80 (BP Location: Left Arm)   Pulse 71   Temp 98.5 F (36.9 C) (Oral)   Resp 16   Ht '5\' 5"'$  (1.651 m)   Wt 86.2 kg   SpO2 100%   BMI 31.62 kg/m  Gen:   Awake, no distress NAD Resp:  Normal effort CTA MSK:   Moves extremities without difficulty  Other:  Soft, nontender  Medical Decision Making  Medically screening exam initiated at 3:34 PM.  Appropriate orders placed.  Erin ERIKSEN was informed that the remainder of the evaluation will be completed by another provider, this initial triage assessment does not replace that evaluation, and the importance of remaining in the ED until their evaluation is complete.  Patient to the ED for evaluation of emesis and food bolus sensation.  Patient reports that yesterday she had emesis of undigested food and feeling of fullness in her throat.  She denies any difficulty breathing or controlling oral secretions.   Erin Needles, PA-C 06/20/22 1537

## 2022-06-20 NOTE — Anesthesia Procedure Notes (Signed)
Procedure Name: Intubation Date/Time: 06/20/2022 9:18 PM  Performed by: Lendon Colonel, CRNAPre-anesthesia Checklist: Patient identified, Patient being monitored, Timeout performed, Emergency Drugs available and Suction available Patient Re-evaluated:Patient Re-evaluated prior to induction Oxygen Delivery Method: Circle system utilized Preoxygenation: Pre-oxygenation with 100% oxygen Induction Type: IV induction and Rapid sequence Laryngoscope Size: 3 and McGraph Grade View: Grade I Tube type: Oral Tube size: 7.0 mm Number of attempts: 1 Airway Equipment and Method: Stylet Placement Confirmation: ETT inserted through vocal cords under direct vision, positive ETCO2 and breath sounds checked- equal and bilateral Secured at: 21 cm Tube secured with: Tape Dental Injury: Teeth and Oropharynx as per pre-operative assessment

## 2022-06-20 NOTE — Transfer of Care (Signed)
Immediate Anesthesia Transfer of Care Note  Patient: Erin Camacho  Procedure(s) Performed: ESOPHAGOGASTRODUODENOSCOPY (EGD)  Patient Location: PACU  Anesthesia Type:General  Level of Consciousness: awake, oriented and patient cooperative  Airway & Oxygen Therapy: Patient Spontanous Breathing and Patient connected to face mask oxygen  Post-op Assessment: Report given to RN and Post -op Vital signs reviewed and stable  Post vital signs: Reviewed and stable  Last Vitals:  Vitals Value Taken Time  BP 178/82 06/20/22 2144  Temp    Pulse 85 06/20/22 2144  Resp 17 06/20/22 2144  SpO2 100 % 06/20/22 2144  Vitals shown include unvalidated device data.  Last Pain:  Vitals:   06/20/22 2100  TempSrc: Oral  PainSc:          Complications: No notable events documented.

## 2022-06-20 NOTE — Telephone Encounter (Signed)
Reviewed.  In ER now.

## 2022-06-20 NOTE — Op Note (Signed)
[facility] Gastroenterology Patient Name: Erin Camacho Procedure Date: 06/20/2022 8:36 PM MRN: [id number] Account #: [id number] Date of Birth: [birth year] Admit Type: Outpatient Age: 76 Room: [facility] ENDO ROOM 4 Gender: Female Note Status: Finalized Instrument Name: Upper Endoscope [id number] Procedure:             Upper GI endoscopy Indications:           Removal of foreign body in the esophagus, Dysphagia Providers:             Benay Pike. Alice Reichert MD, MD Referring MD:          [name], MD (Referring MD) Medicines:             General Anesthesia Complications:         No immediate complications. Procedure:             Pre-Anesthesia Assessment:                        - The risks and benefits of the procedure and the                         sedation options and risks were discussed with the                         patient. All questions were answered and informed                         consent was obtained.                        - Patient identification and proposed procedure were                         verified prior to the procedure by the nurse. The                         procedure was verified in the procedure room.                        - ASA Grade Assessment: III - A patient with severe                         systemic disease.                        - After reviewing the risks and benefits, the patient                         was deemed in satisfactory condition to undergo the                         procedure.                        - The anesthesia plan was to use general anesthesia.                        After obtaining informed consent, the endoscope was  passed under direct vision. Throughout the procedure,                         the patient's blood pressure, pulse, and oxygen                         saturations were monitored continuously. The Endoscope                         was introduced through the mouth, and advanced  to the                         third part of duodenum. The upper GI endoscopy was                         accomplished without difficulty. The patient tolerated                         the procedure well. Findings:      Moderate tortuosity of the mid to distal esophagus was noted compatible       with a diagnosis of Presbyesophagus.      There is no endoscopic evidence of stenosis, stricture, foreign body or       mass in the entire esophagus.      Evidence of a Nissen fundoplication was found in the cardia. The wrap       appeared intact. This was traversed.      Evidence of an adjustable gastric banding was found in the cardia.      There is no endoscopic evidence of bezoar/food (residue), fluid       collections or foreign body in the entire examined stomach.      Extrinsic compression on the stomach was found in the cardia. This is       owed to lap band procedure; no obstruction was noted and mucosa appeared       normal.      The examined duodenum was normal. Impression:            - A Nissen fundoplication was found. The wrap appears                         intact.                        - An adjustable gastric banding was found.                        - Extrinsic compression in the cardia.                        - Normal examined duodenum.                        - No specimens collected. Recommendation:        - Patient has a contact number available for                         emergencies. The signs and symptoms of potential  delayed complications were discussed with the patient.                         Return to normal activities tomorrow. Written                         discharge instructions were provided to the patient.                        - Mechanical soft diet for 2 days.                        - Advance diet as tolerated.                        - Chew food well, small bites, care with chicken and                         red meat, moisten  esophagus with liquid before eating.                        - Return to my office PRN.                        - The findings and recommendations were discussed with                         the patient and their family.                        - Discharge home from endoscopy/PACU Procedure Code(s):     --- Professional ---                        [id number], Esophagogastroduodenoscopy, flexible,                         transoral; diagnostic, including collection of                         specimen(s) by brushing or washing, when performed                         (separate procedure) Diagnosis Code(s):     --- Professional ---                        R13.10, Dysphagia, unspecified                        T18.108A, Unspecified foreign body in esophagus                         causing other injury, initial encounter                        K31.89, Other diseases of stomach and duodenum                        Z98.84, Bariatric surgery status  N12.787, Other specified postprocedural states CPT copyright 2019 [employer]. All rights reserved. The codes documented in this report are preliminary and upon coder review may  be revised to meet current compliance requirements. Efrain Sella MD, MD 06/20/2022 9:39:05 PM This report has been signed electronically. Number of Addenda: 0 Note Initiated On: 06/20/2022 8:36 PM Estimated Blood Loss:  Estimated blood loss: none.      [facility]

## 2022-06-20 NOTE — ED Triage Notes (Signed)
Patient c/o dysphagia starting yesterday. States when she has eaten it backs up and causes her to vomit.

## 2022-06-20 NOTE — Anesthesia Preprocedure Evaluation (Signed)
Anesthesia Evaluation  Patient identified by MRN, date of birth, ID band Patient awake    Reviewed: Allergy & Precautions, H&P , NPO status , Patient's Chart, lab work & pertinent test results, reviewed documented beta blocker date and time   Airway Mallampati: II  TM Distance: >3 FB Neck ROM: full    Dental  (+) Teeth Intact   Pulmonary shortness of breath and with exertion, pneumonia,    Pulmonary exam normal        Cardiovascular Exercise Tolerance: Poor hypertension, On Medications negative cardio ROS Normal cardiovascular exam Rhythm:regular Rate:Normal     Neuro/Psych PSYCHIATRIC DISORDERS Anxiety Depression  Neuromuscular disease    GI/Hepatic Neg liver ROS, hiatal hernia, GERD  Medicated,  Endo/Other  negative endocrine ROSdiabetes, Well Controlled, Type 2, Oral Hypoglycemic Agents  Renal/GU negative Renal ROS  negative genitourinary   Musculoskeletal   Abdominal   Peds  Hematology negative hematology ROS (+)   Anesthesia Other Findings Past Medical History: No date: Allergy     Comment:  hay fever No date: Anxiety No date: Arthritis No date: Chronic back pain No date: Diabetes mellitus No date: Dyspnea     Comment:  a. 07/2017 Echo: EF 60-65%. No rwma. Mildly dil LA. Nl RV              fxn. No date: GERD (gastroesophageal reflux disease) No date: History of hiatal hernia No date: Hyperlipidemia No date: Hypertension No date: Osteoarthritis No date: Pneumonia No date: Spinal stenosis No date: Type 2 diabetes mellitus (HCC) No date: UTI (lower urinary tract infection) Past Surgical History: 1973: ABDOMINAL HYSTERECTOMY No date: ABDOMINAL HYSTERECTOMY No date: APPENDECTOMY 81/27/5170: APPLICATION OF ROBOTIC ASSISTANCE FOR SPINAL PROCEDURE; N/ A     Comment:  Procedure: APPLICATION OF ROBOTIC ASSISTANCE FOR SPINAL               PROCEDURE;  Surgeon: Kristeen Miss, MD;  Location: [facility];               Service: Neurosurgery;  Laterality: N/A; 04/17/13, 2017, 2019: BACK SURGERY 12/16/2019: CATARACT EXTRACTION W/PHACO; Left     Comment:  Procedure: CATARACT EXTRACTION PHACO AND INTRAOCULAR               LENS PLACEMENT (IOC) LEFT PANOPTIX LENS DIABETIC 8.07               01:19.2 10.2%;  Surgeon: Leandrew Koyanagi, MD;                Location: [facility];  Service: Ophthalmology;                Laterality: Left;  Diabetic - oral meds 01/06/2020: CATARACT EXTRACTION W/PHACO; Right     Comment:  Procedure: CATARACT EXTRACTION PHACO AND INTRAOCULAR               LENS PLACEMENT (IOC) PANOPTIX LENS RIGHT DIABETIC 10.73               01:08.6 15.7%;  Surgeon: Leandrew Koyanagi, MD;                Location: [facility];  Service: Ophthalmology;                Laterality: Right;  diabetic 1995: CHOLECYSTECTOMY 03/15/2021: COLONOSCOPY WITH PROPOFOL; N/A     Comment:  Procedure: COLONOSCOPY WITH PROPOFOL;  Surgeon: Robert Bellow, MD;  Location: [facility];  Service:               Endoscopy;  Laterality: N/A; 2021: EYE SURGERY     Comment:  bilateral cataract removal with lens implants 08/2006: HERNIA REPAIR 08/2003: lap band 12/29/2020: LUMBAR LAMINECTOMY/ DECOMPRESSION WITH MET-RX; Left     Comment:  Procedure: Left Thoracic Seven-Eight Microdiscectomy;                Surgeon: Kristeen Miss, MD;  Location: [facility];  Service:               Neurosurgery;  Laterality: Left; 04/1978: SEPTOPLASTY 1979: TONSILLECTOMY AND ADENOIDECTOMY 02/2012: TOTAL HIP ARTHROPLASTY     Comment:  Right, Dr. Marry Guan 08/31/2019: TOTAL HIP ARTHROPLASTY; Left     Comment:  Procedure: TOTAL HIP ARTHROPLASTY;  Surgeon: Dereck Leep, MD;  Location: [facility];  Service: Orthopedics;               Laterality: Left; BMI    Body Mass Index: 31.62 kg/m     Reproductive/Obstetrics negative OB ROS                             Anesthesia  Physical Anesthesia Plan  ASA: 3 and emergent  Anesthesia Plan: General ETT   Post-op Pain Management:    Induction:   PONV Risk Score and Plan:   Airway Management Planned:   Additional Equipment:   Intra-op Plan:   Post-operative Plan:   Informed Consent: I have reviewed the patients History and Physical, chart, labs and discussed the procedure including the risks, benefits and alternatives for the proposed anesthesia with the patient or authorized representative who has indicated his/her understanding and acceptance.     Dental Advisory Given  Plan Discussed with: CRNA  Anesthesia Plan Comments:         Anesthesia Quick Evaluation

## 2022-06-20 NOTE — Telephone Encounter (Signed)
Pt had lap band surgery in 2004 and she has got to where she can not swallow as of yesterday. Pt tried to eat and it does not go down. Pt would like to be called

## 2022-06-20 NOTE — Interval H&P Note (Signed)
History and Physical Interval Note:  06/20/2022 9:00 PM  Erin Camacho  has presented today for surgery, with the diagnosis of fb.  The various methods of treatment have been discussed with the patient and family. After consideration of risks, benefits and other options for treatment, the patient has consented to  Procedure(s): ESOPHAGOGASTRODUODENOSCOPY (EGD) (N/A) as a surgical intervention.  [name] history has been reviewed, patient examined, no change in status, stable for surgery.  I have reviewed the patient's chart and labs.  Questions were answered to the patient's satisfaction.     [place], [place]

## 2022-06-20 NOTE — H&P (View-Only) (Signed)
[facility] GI Inpatient Consult Note   Kathline Magic, M.D.  Reason for Consult: Esophageal dysphagia, Esophageal foreign body   Attending Requesting Consult: Harvest Dark, M.D. (ED)  Outpatient Primary Physician: [name], M.D.  History of Present Illness: Erin Camacho is a 76 y.o. female status post laparoscopic band procedure in 2004, status post hiatal hernia repair in 2007 presenting for a 24-hour complaint of dysphagia to all liquids and solids.  Patient reports eating a casserole almost 24 hours ago and being unable to pass the food into the stomach area.  She feels mild to moderate pressure in the neck region add just about the level of the suprasternal notch or slightly below.  She is able to hold down secretions but is unable to pass any meaningful amount of liquids or solids.  She was seen in the emergency room a few hours ago and given 1 mg of IV glucagon which seemed to help the symptoms of pressure but still, the patient was unable to swallow liquids and they were spit out shortly after swallowed.  GI service has been called regarding likely foreign body in the esophagus and need for endoscopic food clearance. Patient denies frequent GERD, hematemesis, abdominal pain, fever, shortness of breath, cough or chest pain.  Patient does not take any anticoagulants at home.  She has undergone previous abdominal hysterectomy.  Patient takes chronic opioids (tramadol) for back pain.  Past Medical History:  Past Medical History:  Diagnosis Date   Allergy    hay fever   Anxiety    Arthritis    Chronic back pain    Diabetes mellitus    Dyspnea    a. 07/2017 Echo: EF 60-65%. No rwma. Mildly dil LA. Nl RV fxn.   GERD (gastroesophageal reflux disease)    History of hiatal hernia    Hyperlipidemia    Hypertension    Osteoarthritis    Pneumonia    Spinal stenosis    Type 2 diabetes mellitus ([place])    UTI (lower urinary tract infection)     Problem  List: Patient Active Problem List   Diagnosis Date Noted   Bilateral carotid artery stenosis 01/31/2022   Balance problem 01/31/2022   History of COVID-19 11/25/2021   Fall 05/22/2021   Herniated nucleus pulposus with myelopathy, thoracic 12/29/2020   Low back pain 09/15/2020   Opioid use (10 MME/day) 05/16/2020   Hypokalemia 04/28/2020   Chronic pain syndrome 04/20/2020   Pharmacologic therapy 04/20/2020   Disorder of skeletal system 04/20/2020   Problems influencing health status 04/20/2020   Chronic thoracic spine pain (Bilateral) (R>L) 04/20/2020   Myofascial pain syndrome of thoracic spine 04/20/2020   Chronic musculoskeletal pain 04/20/2020   Spasm of thoracic back muscle 04/20/2020   Weight loss 12/31/2019   History of total hip replacement (Left) 08/31/2019   Early satiety 07/05/2019   Lumbar stenosis with neurogenic claudication 09/15/2018   Shortness of breath 06/19/2018   Cough 04/13/2018   Aortic atherosclerosis ([place]) 01/11/2018   GERD (gastroesophageal reflux disease) 12/23/2016   Atrophic vaginitis 03/18/2016   Spondylolisthesis at L5-S1 level 12/06/2015   Rhinitis, allergic 11/22/2015   Other abnormal glucose 11/29/2014   Right leg pain 07/27/2014   Neuritis or radiculitis due to rupture of lumbar intervertebral disc 06/28/2014   Chronic shoulder pain (Bilateral) 02/24/2014   Skin lesion 01/18/2014   History of bariatric surgery 01/18/2014   Status post bariatric surgery 01/18/2014   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  Depression, major, single episode, mild ([place]) 05/14/2013   Major depressive disorder, single episode 05/14/2013   Healthcare maintenance 01/07/2013   Sinusitis 12/08/2012   Hypercholesterolemia 08/22/2012   Hyperlipidemia 08/22/2012   Hypertension 07/07/2012   Diabetes mellitus type 2, diet-controlled ([place]) 07/07/2012   Insomnia 07/07/2012   Obesity (BMI 30-39.9) 07/07/2012   Essential (primary) hypertension 07/07/2012    Chronic low back pain (1ry area of Pain) (Bilateral) (R>L) w/o sciatica 10/18/2011    Past Surgical History: Past Surgical History:  Procedure Laterality Date   ABDOMINAL HYSTERECTOMY  1973   ABDOMINAL HYSTERECTOMY     APPENDECTOMY     APPLICATION OF ROBOTIC ASSISTANCE FOR SPINAL PROCEDURE N/A 09/15/2018   Procedure: APPLICATION OF ROBOTIC ASSISTANCE FOR SPINAL PROCEDURE;  Surgeon: Kristeen Miss, MD;  Location: [facility];  Service: Neurosurgery;  Laterality: N/A;   BACK SURGERY  04/17/13, 2017, 2019   CATARACT EXTRACTION W/PHACO Left 12/16/2019   Procedure: CATARACT EXTRACTION PHACO AND INTRAOCULAR LENS PLACEMENT (IOC) LEFT PANOPTIX LENS DIABETIC 8.07 01:19.2 10.2%;  Surgeon: Leandrew Koyanagi, MD;  Location: [facility];  Service: Ophthalmology;  Laterality: Left;  Diabetic - oral meds   CATARACT EXTRACTION W/PHACO Right 01/06/2020   Procedure: CATARACT EXTRACTION PHACO AND INTRAOCULAR LENS PLACEMENT (IOC) PANOPTIX LENS RIGHT DIABETIC 10.73 01:08.6 15.7%;  Surgeon: Leandrew Koyanagi, MD;  Location: [facility];  Service: Ophthalmology;  Laterality: Right;  diabetic   CHOLECYSTECTOMY  1995   COLONOSCOPY WITH PROPOFOL N/A 03/15/2021   Procedure: COLONOSCOPY WITH PROPOFOL;  Surgeon: Robert Bellow, MD;  Location: [facility];  Service: Endoscopy;  Laterality: N/A;   EYE SURGERY  2021   bilateral cataract removal with lens implants   HERNIA REPAIR  08/2006   lap band  08/2003   LUMBAR LAMINECTOMY/ DECOMPRESSION WITH MET-RX Left 12/29/2020   Procedure: Left Thoracic Seven-Eight Microdiscectomy;  Surgeon: Kristeen Miss, MD;  Location: [facility];  Service: Neurosurgery;  Laterality: Left;   SEPTOPLASTY  04/1978   TONSILLECTOMY AND ADENOIDECTOMY  1979   TOTAL HIP ARTHROPLASTY  02/2012   Right, Dr. Marry Guan   TOTAL HIP ARTHROPLASTY Left 08/31/2019   Procedure: TOTAL HIP ARTHROPLASTY;  Surgeon: Dereck Leep, MD;  Location: [facility];  Service: Orthopedics;  Laterality: Left;     Allergies: Allergies  Allergen Reactions   Adhesive [Tape] Itching    Honeycomb bandage - redness/itching    Home Medications: (Not in a hospital admission)  Home medication reconciliation was completed with the patient.   Scheduled Inpatient Medications:    Continuous Inpatient Infusions:    PRN Inpatient Medications:    Family History: family history includes Arthritis in her sister; Breast cancer (age of onset: 45) in an other family member; Cancer in her sister and another family member; Dementia in her brother and brother; Diabetes in her brother, brother, sister, and another family member; Heart disease in her father and mother; Hypertension in her brother, brother, father, and mother; Stroke in her father and mother.   GI Family History: Negative  Social History:   reports that she has never smoked. She has never used smokeless tobacco. She reports current alcohol use. She reports that she does not use drugs. The patient denies ETOH, tobacco, or drug use.    Review of Systems: Review of Systems - Negative except that in HPI.  Physical Examination: BP (!) 192/80 (BP Location: Right Arm)   Pulse 61   Temp 98.5 F (36.9 C) (Oral)   Resp 17   Ht 5' 5" (1.651  m)   Wt 86.2 kg   SpO2 100%   BMI 31.62 kg/m  Physical Exam Vitals reviewed.  Constitutional:      General: She is not in acute distress.    Appearance: Normal appearance. She is not ill-appearing, toxic-appearing or diaphoretic.  HENT:     Head: Normocephalic and atraumatic.     Nose: Nose normal.  Eyes:     General: No scleral icterus.    Extraocular Movements: Extraocular movements intact.     Conjunctiva/sclera: Conjunctivae normal.     Pupils: Pupils are equal, round, and reactive to light.  Cardiovascular:     Rate and Rhythm: Normal rate.     Pulses: Normal pulses.  Pulmonary:     Effort: Pulmonary effort is normal. No respiratory distress.     Breath sounds: Normal breath sounds. No  stridor. No wheezing or rhonchi.  Abdominal:     General: There is no distension.     Palpations: Abdomen is soft. There is no mass.     Tenderness: There is no abdominal tenderness. There is no guarding or rebound.  Musculoskeletal:        General: Normal range of motion.     Cervical back: Normal range of motion and neck supple. No rigidity or tenderness.  Skin:    General: Skin is warm and dry.     Findings: No rash.  Neurological:     General: No focal deficit present.     Mental Status: She is alert and oriented to person, place, and time.  Psychiatric:        Mood and Affect: Mood normal.        Thought Content: Thought content normal.        Judgment: Judgment normal.     Data: Lab Results  Component Value Date   WBC 4.9 06/20/2022   HGB 13.0 06/20/2022   HCT 40.2 06/20/2022   MCV 95.9 06/20/2022   PLT 183 06/20/2022   Recent Labs  Lab 06/20/22 1519  HGB 13.0   Lab Results  Component Value Date   NA 138 06/20/2022   K 3.3 (L) 06/20/2022   CL 104 06/20/2022   CO2 23 06/20/2022   BUN 19 06/20/2022   CREATININE 0.79 06/20/2022   Lab Results  Component Value Date   ALT 12 04/12/2022   AST 18 04/12/2022   ALKPHOS 70 04/12/2022   BILITOT 0.9 04/12/2022   No results for input(s): "APTT", "INR", "PTT" in the last 168 hours.    Latest Ref Rng & Units 06/20/2022    3:19 PM 06/12/2022    9:48 AM 06/08/2022   10:08 AM  CBC  WBC 4.0 - 10.5 K/uL 4.9  5.1  4.6   Hemoglobin 12.0 - 15.0 g/dL 13.0  12.6  12.9   Hematocrit 36.0 - 46.0 % 40.2  39.2  39.0   Platelets 150 - 400 K/uL 183  184  190     STUDIES: No results found. _0 @  Assessment: Persistent esophageal dysphagia secondary to esophageal food impaction. History of laparascopic banding procedure (LAP-BAND). 2004. History of Hiatal hernia repair. 2007.  COVID-19 status: Not tested/Low or no suspicion for COVID-19   Recommendations:  Prompt EGD tonight. The patient understands the nature of the  planned procedure, indications, risks, alternatives and potential complications including but not limited to bleeding, infection, perforation, damage to internal organs and possible oversedation/side effects from anesthesia. The patient agrees and gives consent to proceed.  Please refer to procedure notes  for findings, recommendations and patient disposition/instructions.   Thank you for the consult. Please call with questions or concerns.  Olean Ree, "Lanny Hurst MD [facility] [place], [place] [address] [phone]  06/20/2022 8:17 PM

## 2022-06-20 NOTE — Telephone Encounter (Signed)
S/w pt - has not been able to hold anything solid down in 24hrs. Has been able to have some liquids - water, coffee. Has been able to take some medications that are small pills. Larger pills have not stayed down. Pt stated she is worried she has another hiatal hernia - has had previous surgery on esophagus for hiatal hernia -  Wanted [place] to know she is going to ER to be evaluated.  I advised pt I think that is the best thing to do and to please keep [place] updated.

## 2022-06-21 ENCOUNTER — Encounter: Payer: Self-pay | Admitting: Internal Medicine

## 2022-06-21 MED ORDER — PROPOFOL 10 MG/ML IV BOLUS
INTRAVENOUS | Status: DC | PRN
  Administered 2022-06-20: 120 mg via INTRAVENOUS

## 2022-06-21 MED ORDER — SUCCINYLCHOLINE CHLORIDE 200 MG/10ML IV SOSY
PREFILLED_SYRINGE | INTRAVENOUS | Status: DC | PRN
  Administered 2022-06-20: 100 mg via INTRAVENOUS

## 2022-06-21 MED ORDER — LIDOCAINE HCL (CARDIAC) PF 100 MG/5ML IV SOSY
PREFILLED_SYRINGE | INTRAVENOUS | Status: DC | PRN
  Administered 2022-06-20: 80 mg via INTRAVENOUS

## 2022-06-21 NOTE — Anesthesia Postprocedure Evaluation (Signed)
Anesthesia Post Note  Patient: Erin Camacho  Procedure(s) Performed: ESOPHAGOGASTRODUODENOSCOPY (EGD)  Patient location during evaluation: PACU Anesthesia Type: General Level of consciousness: awake and alert Pain management: pain level controlled Vital Signs Assessment: post-procedure vital signs reviewed and stable Respiratory status: spontaneous breathing, nonlabored ventilation, respiratory function stable and patient connected to nasal cannula oxygen Cardiovascular status: blood pressure returned to baseline and stable Postop Assessment: no apparent nausea or vomiting Anesthetic complications: no   No notable events documented.   Last Vitals:  Vitals:   06/20/22 2144 06/20/22 2204  BP: (!) 178/82 (!) 160/90  Pulse: 84   Resp: 16   Temp: (!) 36.1 C   SpO2: 99%     Last Pain:  Vitals:   06/20/22 2214  TempSrc:   PainSc: [place] Sharif Rendell

## 2022-06-26 DIAGNOSIS — J301 Allergic rhinitis due to pollen: Secondary | ICD-10-CM | POA: Diagnosis not present

## 2022-06-27 DIAGNOSIS — J301 Allergic rhinitis due to pollen: Secondary | ICD-10-CM | POA: Diagnosis not present

## 2022-06-27 DIAGNOSIS — K219 Gastro-esophageal reflux disease without esophagitis: Secondary | ICD-10-CM | POA: Diagnosis not present

## 2022-06-28 DIAGNOSIS — M5414 Radiculopathy, thoracic region: Secondary | ICD-10-CM | POA: Diagnosis not present

## 2022-06-28 DIAGNOSIS — M5114 Intervertebral disc disorders with radiculopathy, thoracic region: Secondary | ICD-10-CM | POA: Diagnosis not present

## 2022-07-03 DIAGNOSIS — J301 Allergic rhinitis due to pollen: Secondary | ICD-10-CM | POA: Diagnosis not present

## 2022-07-10 DIAGNOSIS — J301 Allergic rhinitis due to pollen: Secondary | ICD-10-CM | POA: Diagnosis not present

## 2022-07-13 ENCOUNTER — Ambulatory Visit: Payer: Self-pay | Admitting: *Deleted

## 2022-07-13 NOTE — Patient Instructions (Signed)
Visit Information  Thank you for taking time to visit with me today. Please don't hesitate to contact me if I can be of assistance to you.   Following are the goals we discussed today:   Goals Addressed             This Visit's Progress    Care coordination activities       Care Coordination Interventions: Phone call to patient to discuss/offer case management services-spoke with patient's spouse(DPR)  SDOH screen completed Per patient's spouse, he would like to speak with patient's provider during appointment on 08/17/22 to discuss need involvement in this program Patient's spouse encouraged to schedule patient's AWV-benefits discussed         Please call the care guide team at [phone] if you need to schedule an appointment with the care management team  If you are experiencing a Mental Health or [place] or need someone to talk to, please call the Suicide and Crisis Lifeline: [phone]   Patient verbalizes understanding of instructions and care plan provided today and agrees to view in [place]. Active MyChart status and patient understanding of how to access instructions and care plan via MyChart confirmed with patient.     Next PCP appointment scheduled for:  08/17/22  Sheralyn Boatman [facility] Care Management [phone]

## 2022-07-13 NOTE — Patient Outreach (Signed)
  Care Coordination   Initial Visit Note   07/13/2022 Name: Erin Camacho MRN: [id number] DOB: 04/20/1946  Erin Camacho is a 76 y.o. year old female who sees [name], MD for primary care. I spoke with  spouse of Erin Camacho by phone today.  What matters to the patients health and wellness today?  Patient's spouse reports no nursing or community resource needs at this time, however will discuss with patient's provider on 08/17/22    Goals Addressed             This Visit's Progress    Care coordination activities       Care Coordination Interventions: Phone call to patient to discuss/offer case management services-spoke with patient's spouse(DPR)  SDOH screen completed Per patient's spouse, he would like to speak with patient's provider during appointment on 08/17/22 to discuss need involvement in this program Patient's spouse encouraged to schedule patient's AWV-benefits discussed        SDOH assessments and interventions completed:  Yes     Care Coordination Interventions Activated:  Yes  Care Coordination Interventions:  Yes, provided   Follow up plan: No further intervention required.   Encounter Outcome:  Pt. Visit Completed

## 2022-07-17 ENCOUNTER — Other Ambulatory Visit: Payer: Self-pay | Admitting: Internal Medicine

## 2022-07-17 DIAGNOSIS — J301 Allergic rhinitis due to pollen: Secondary | ICD-10-CM | POA: Diagnosis not present

## 2022-07-24 DIAGNOSIS — J301 Allergic rhinitis due to pollen: Secondary | ICD-10-CM | POA: Diagnosis not present

## 2022-07-26 DIAGNOSIS — H0014 Chalazion left upper eyelid: Secondary | ICD-10-CM | POA: Diagnosis not present

## 2022-07-28 DIAGNOSIS — R69 Illness, unspecified: Secondary | ICD-10-CM | POA: Diagnosis not present

## 2022-07-28 DIAGNOSIS — I10 Essential (primary) hypertension: Secondary | ICD-10-CM | POA: Diagnosis not present

## 2022-07-28 DIAGNOSIS — E1140 Type 2 diabetes mellitus with diabetic neuropathy, unspecified: Secondary | ICD-10-CM | POA: Diagnosis not present

## 2022-07-28 DIAGNOSIS — E785 Hyperlipidemia, unspecified: Secondary | ICD-10-CM | POA: Diagnosis not present

## 2022-07-28 DIAGNOSIS — E669 Obesity, unspecified: Secondary | ICD-10-CM | POA: Diagnosis not present

## 2022-07-28 DIAGNOSIS — I2510 Atherosclerotic heart disease of native coronary artery without angina pectoris: Secondary | ICD-10-CM | POA: Diagnosis not present

## 2022-07-28 DIAGNOSIS — H547 Unspecified visual loss: Secondary | ICD-10-CM | POA: Diagnosis not present

## 2022-07-28 DIAGNOSIS — G4700 Insomnia, unspecified: Secondary | ICD-10-CM | POA: Diagnosis not present

## 2022-07-28 DIAGNOSIS — J301 Allergic rhinitis due to pollen: Secondary | ICD-10-CM | POA: Diagnosis not present

## 2022-07-28 DIAGNOSIS — I700 Atherosclerosis of aorta: Secondary | ICD-10-CM | POA: Diagnosis not present

## 2022-07-28 DIAGNOSIS — K219 Gastro-esophageal reflux disease without esophagitis: Secondary | ICD-10-CM | POA: Diagnosis not present

## 2022-07-30 ENCOUNTER — Emergency Department
Admission: EM | Admit: 2022-07-30 | Discharge: 2022-07-30 | Disposition: A | Discharge status: Home / Self Care | Payer: Medicare HMO | Attending: Emergency Medicine | Admitting: Emergency Medicine

## 2022-07-30 ENCOUNTER — Other Ambulatory Visit: Payer: Self-pay

## 2022-07-30 ENCOUNTER — Emergency Department: Payer: Medicare HMO

## 2022-07-30 DIAGNOSIS — R42 Dizziness and giddiness: Secondary | ICD-10-CM | POA: Diagnosis not present

## 2022-07-30 DIAGNOSIS — I10 Essential (primary) hypertension: Secondary | ICD-10-CM | POA: Diagnosis not present

## 2022-07-30 DIAGNOSIS — E119 Type 2 diabetes mellitus without complications: Secondary | ICD-10-CM | POA: Diagnosis not present

## 2022-07-30 DIAGNOSIS — J301 Allergic rhinitis due to pollen: Secondary | ICD-10-CM | POA: Diagnosis not present

## 2022-07-30 DIAGNOSIS — R519 Headache, unspecified: Secondary | ICD-10-CM | POA: Diagnosis present

## 2022-07-30 DIAGNOSIS — R001 Bradycardia, unspecified: Secondary | ICD-10-CM | POA: Diagnosis not present

## 2022-07-30 LAB — BASIC METABOLIC PANEL WITH GFR
Anion gap: 10 (ref 5–15)
BUN: 17 mg/dL (ref 8–23)
CO2: 26 mmol/L (ref 22–32)
Calcium: 9.7 mg/dL (ref 8.9–10.3)
Chloride: 105 mmol/L (ref 98–111)
Creatinine, Ser: 0.82 mg/dL (ref 0.44–1.00)
GFR, Estimated: >60 mL/min
Glucose, Bld: 102 mg/dL — ABNORMAL HIGH (ref 70–99)
Potassium: 4 mmol/L (ref 3.5–5.1)
Sodium: 141 mmol/L (ref 135–145)

## 2022-07-30 LAB — CBC WITH DIFFERENTIAL/PLATELET
Abs Immature Granulocytes: 0.02 10*3/uL (ref 0.00–0.07)
Basophils Absolute: 0 10*3/uL (ref 0.0–0.1)
Basophils Relative: 1 %
Eosinophils Absolute: 0.1 10*3/uL (ref 0.0–0.5)
Eosinophils Relative: 2 %
HCT: 39.2 % (ref 36.0–46.0)
Hemoglobin: 12.4 g/dL (ref 12.0–15.0)
Immature Granulocytes: 0 %
Lymphocytes Relative: 20 %
Lymphs Abs: 1.1 10*3/uL (ref 0.7–4.0)
MCH: 30.8 pg (ref 26.0–34.0)
MCHC: 31.6 g/dL (ref 30.0–36.0)
MCV: 97.5 fL (ref 80.0–100.0)
Monocytes Absolute: 0.5 10*3/uL (ref 0.1–1.0)
Monocytes Relative: 10 %
Neutro Abs: 3.7 10*3/uL (ref 1.7–7.7)
Neutrophils Relative %: 67 %
Platelets: 174 10*3/uL (ref 150–400)
RBC: 4.02 MIL/uL (ref 3.87–5.11)
RDW: 13.2 % (ref 11.5–15.5)
WBC: 5.5 10*3/uL (ref 4.0–10.5)
nRBC: 0 % (ref 0.0–0.2)

## 2022-07-30 MED ORDER — PROCHLORPERAZINE EDISYLATE 10 MG/2ML IJ SOLN
10.0000 mg | Freq: Once | INTRAMUSCULAR | Status: AC
  Administered 2022-07-30: 10 mg via INTRAVENOUS
  Filled 2022-07-30: qty 2

## 2022-07-30 MED ORDER — DIPHENHYDRAMINE HCL 50 MG/ML IJ SOLN
25.0000 mg | Freq: Once | INTRAMUSCULAR | Status: AC
  Administered 2022-07-30: 25 mg via INTRAVENOUS
  Filled 2022-07-30: qty 1

## 2022-07-30 MED ORDER — KETOROLAC TROMETHAMINE 30 MG/ML IJ SOLN
15.0000 mg | Freq: Once | INTRAMUSCULAR | Status: AC
  Administered 2022-07-30: 15 mg via INTRAVENOUS
  Filled 2022-07-30: qty 1

## 2022-07-30 MED ORDER — LACTATED RINGERS IV BOLUS
1000.0000 mL | Freq: Once | INTRAVENOUS | Status: AC
  Administered 2022-07-30: 1000 mL via INTRAVENOUS

## 2022-07-30 MED ORDER — KETOROLAC TROMETHAMINE 10 MG PO TABS: 10.0000 mg | ORAL_TABLET | Freq: Three times a day (TID) | ORAL | 0 refills | Status: DC | PRN

## 2022-07-30 NOTE — ED Provider Notes (Signed)
[facility] Provider Note    Event Date/Time   First MD Initiated Contact with Patient 07/30/22 1015     (approximate)   History   Chief Complaint Headache, Nausea, and Dizziness   HPI  Erin Camacho is a 76 y.o. female with past medical history of hypertension, diabetes, GERD, and chronic back pain who presents to the ED complaining of headache.  Patient reports that she has been dealing with constant and gradually worsening headache for the past 2 days, which she states starts in her neck and moves upward into the back and both sides of her head.  Pain seems to be exacerbated by bright lights and she has been feeling slightly nauseous, but she denies any vomiting.  She denies any vision changes, speech changes, numbness, or weakness.  She denies any similar headaches in the past, specifically denies any history of migraines.  She has not had any fevers or neck stiffness.  She takes tramadol regularly for her back, states this has not alleviated her symptoms.  Patient reports that she has felt slightly dizzy and unsteady on her feet at times.     Physical Exam   Triage Vital Signs: ED Triage Vitals  Enc Vitals Group     BP 07/30/22 1022 (!) 187/76     Pulse Rate 07/30/22 1022 (!) 58     Resp 07/30/22 1022 16     Temp 07/30/22 1022 98.6 F (37 C)     Temp Source 07/30/22 1022 Oral     SpO2 07/30/22 1018 98 %     Weight 07/30/22 1023 188 lb (85.3 kg)     Height 07/30/22 1023 '5\' 5"'$  (1.651 m)     Head Circumference --      Peak Flow --      Pain Score 07/30/22 1023 10     Pain Loc --      Pain Edu? --      Excl. in [place]? --     Most recent vital signs: Vitals:   07/30/22 1022 07/30/22 1137  BP: (!) 187/76 (!) 122/94  Pulse: (!) 58 61  Resp: 16 19  Temp: 98.6 F (37 C)   SpO2: 95% 94%    Constitutional: Alert and oriented. Eyes: Conjunctivae are normal.  Pupils equal, round, and reactive to light bilaterally. Head: Atraumatic.  No temporal  tenderness bilaterally. Nose: No congestion/rhinnorhea. Mouth/Throat: Mucous membranes are moist.  Neck: Supple with no meningismus. Cardiovascular: Normal rate, regular rhythm. Grossly normal heart sounds.  2+ radial pulses bilaterally. Respiratory: Normal respiratory effort.  No retractions. Lungs CTAB. Gastrointestinal: Soft and nontender. No distention. Musculoskeletal: No lower extremity tenderness nor edema.  Neurologic:  Normal speech and language. No gross focal neurologic deficits are appreciated.    ED Results / Procedures / Treatments   Labs (all labs ordered are listed, but only abnormal results are displayed) Labs Reviewed  BASIC METABOLIC PANEL - Abnormal; Notable for the following components:      Result Value   Glucose, Bld 102 (*)    All other components within normal limits  CBC WITH DIFFERENTIAL/PLATELET  CBC WITH DIFFERENTIAL/PLATELET     EKG  ED ECG REPORT I, Blake Divine, the attending physician, personally viewed and interpreted this ECG.   Date: 07/30/2022  EKG Time: 10:47  Rate: 59  Rhythm: sinus bradycardia  Axis: LAD  Intervals:none  ST&T Change: LVH  RADIOLOGY CT head reviewed and interpreted by me with no hemorrhage or midline shift.  PROCEDURES:  Critical Care performed: No  Procedures   MEDICATIONS ORDERED IN ED: Medications  prochlorperazine (COMPAZINE) injection 10 mg (10 mg Intravenous Given 07/30/22 1133)  diphenhydrAMINE (BENADRYL) injection 25 mg (25 mg Intravenous Given 07/30/22 1133)  lactated ringers bolus 1,000 mL (1,000 mLs Intravenous New Bag/Given 07/30/22 1120)  ketorolac (TORADOL) 30 MG/ML injection 15 mg (15 mg Intravenous Given 07/30/22 1249)     IMPRESSION / MDM / ASSESSMENT AND PLAN / ED COURSE  I reviewed the triage vital signs and the nursing notes.                              76 y.o. female with past medical history of hypertension, diabetes, GERD, and chronic back pain who presents to the ED with 2  days of gradually worsening headache starting in the back of her neck and moving up to both sides of her head.  Patient's presentation is most consistent with acute presentation with potential threat to life or bodily function.  Differential diagnosis includes, but is not limited to, SAH, meningitis, tension headache, migraine headache, cluster headache, electrolyte abnormality, anemia.  Patient nontoxic-appearing and in no acute distress, vital signs are unremarkable and she has a nonfocal neurologic exam.  Overall low suspicion for [facility] given gradually worsening headache over the course of 2 days, but given new onset headache and patient of advanced age, we will check CT head.  EKG shows no evidence of arrhythmia or ischemia, we will screen labs given her dizziness but no findings on neurologic exam to suggest stroke.  Plan to treat symptomatically with IV Compazine and Benadryl, hydrate with IV fluids and reassess following imaging.  Labs are reassuring with no significant anemia, leukocytosis, electrolyte abnormality, or AKI.  CT head is unremarkable.  Patient with minimal improvement in headache following Compazine and Benadryl, was given dose of IV Toradol with improvement in headache.  Given reassuring work-up, she is appropriate for discharge home with PCP follow-up.  She will be prescribed a small amount of Toradol to take as needed over the next couple of days for ongoing headache.  She was counseled to return to the ED for new or worsening symptoms, patient agrees with plan.      FINAL CLINICAL IMPRESSION(S) / ED DIAGNOSES   Final diagnoses:  Acute nonintractable headache, unspecified headache type     Rx / DC Orders   ED Discharge Orders          Ordered    ketorolac (TORADOL) 10 MG tablet  Every 8 hours PRN        07/30/22 1409             Note:  This document was prepared using Dragon voice recognition software and may include unintentional dictation errors.    Blake Divine, MD 07/30/22 [phone]

## 2022-07-30 NOTE — ED Triage Notes (Signed)
Pt arrived via EMS presenting with headache, nausea, and dizziness over last 2 days getting progressively worse. Pt states pain starts in her neck and has now radiated up to her head. Pt has hx of back surgery x4. Pt diagnosed with thoracic inflammation 3 weeks ago and given steroid injection.

## 2022-07-30 NOTE — ED Notes (Signed)
MSE waiver: unable to obtain signature due to signature pad not working. Verbal consent obtained.

## 2022-07-30 NOTE — ED Notes (Signed)
Unsuccessful IV attempts x2 by this RN. Blood specimen obtained and sent to lab.

## 2022-07-31 ENCOUNTER — Ambulatory Visit: Payer: Medicare HMO | Admitting: Cardiovascular Disease

## 2022-08-02 ENCOUNTER — Telehealth: Payer: Self-pay

## 2022-08-02 ENCOUNTER — Telehealth: Payer: Self-pay | Admitting: Internal Medicine

## 2022-08-02 DIAGNOSIS — E7800 Pure hypercholesterolemia, unspecified: Secondary | ICD-10-CM

## 2022-08-02 DIAGNOSIS — E119 Type 2 diabetes mellitus without complications: Secondary | ICD-10-CM

## 2022-08-02 DIAGNOSIS — I10 Essential (primary) hypertension: Secondary | ICD-10-CM

## 2022-08-02 NOTE — Telephone Encounter (Signed)
     Patient  visit on 9/11  at [place] was for headache  Have you been able to follow up with your primary care physician?yes  The patient was or was not able to obtain any needed medicine or equipment.yes  Are there diet recommendations that you are having difficulty following?na  Patient expresses understanding of discharge instructions and education provided has no other needs at this time. yes    [place], Care Management  [phone] [address], [place],  [address] Phone: [phone] Email: [email or web address]

## 2022-08-02 NOTE — Telephone Encounter (Signed)
Patient has a lab appt 08/10/22, there are no orders in.

## 2022-08-03 ENCOUNTER — Telehealth: Payer: Self-pay

## 2022-08-03 NOTE — Telephone Encounter (Signed)
Orders placed for labs

## 2022-08-03 NOTE — Telephone Encounter (Signed)
     Patient  visit on 9/11 at [place] was for headache  Have you been able to follow up with your primary care physician? yes  The patient was or was not able to obtain any needed medicine or equipment.yes  Are there diet recommendations that you are having difficulty following?na  Patient expresses understanding of discharge instructions and education provided has no other needs at this time.  yes    [place], Care Management  [phone] [address], [place], [place] [address] Phone: [phone] Email: [email or web address]

## 2022-08-03 NOTE — Addendum Note (Signed)
Addended by: Alisa Graff on: 08/03/2022 03:57 AM   Modules accepted: Orders

## 2022-08-07 DIAGNOSIS — J301 Allergic rhinitis due to pollen: Secondary | ICD-10-CM | POA: Diagnosis not present

## 2022-08-08 DIAGNOSIS — R9082 White matter disease, unspecified: Secondary | ICD-10-CM | POA: Diagnosis not present

## 2022-08-08 DIAGNOSIS — G4750 Parasomnia, unspecified: Secondary | ICD-10-CM | POA: Diagnosis not present

## 2022-08-08 DIAGNOSIS — H00014 Hordeolum externum left upper eyelid: Secondary | ICD-10-CM | POA: Diagnosis not present

## 2022-08-08 DIAGNOSIS — M542 Cervicalgia: Secondary | ICD-10-CM | POA: Diagnosis not present

## 2022-08-10 ENCOUNTER — Other Ambulatory Visit: Payer: Medicare HMO

## 2022-08-13 ENCOUNTER — Other Ambulatory Visit: Payer: Self-pay | Admitting: Internal Medicine

## 2022-08-14 DIAGNOSIS — J301 Allergic rhinitis due to pollen: Secondary | ICD-10-CM | POA: Diagnosis not present

## 2022-08-15 ENCOUNTER — Other Ambulatory Visit: Payer: Self-pay | Admitting: Internal Medicine

## 2022-08-15 ENCOUNTER — Other Ambulatory Visit: Payer: Medicare HMO

## 2022-08-16 ENCOUNTER — Other Ambulatory Visit (INDEPENDENT_AMBULATORY_CARE_PROVIDER_SITE_OTHER): Payer: Medicare HMO

## 2022-08-16 DIAGNOSIS — E7800 Pure hypercholesterolemia, unspecified: Secondary | ICD-10-CM

## 2022-08-16 DIAGNOSIS — E119 Type 2 diabetes mellitus without complications: Secondary | ICD-10-CM

## 2022-08-16 LAB — HEMOGLOBIN A1C: Hgb A1c MFr Bld: 6.2 % (ref 4.6–6.5)

## 2022-08-16 LAB — LIPID PANEL
Cholesterol: 159 mg/dL (ref 0–200)
HDL: 62.7 mg/dL
LDL Cholesterol: 83 mg/dL (ref 0–99)
NonHDL: 96.28
Total CHOL/HDL Ratio: 3
Triglycerides: 64 mg/dL (ref 0.0–149.0)
VLDL: 12.8 mg/dL (ref 0.0–40.0)

## 2022-08-16 LAB — BASIC METABOLIC PANEL WITH GFR
BUN: 20 mg/dL (ref 6–23)
CO2: 30 meq/L (ref 19–32)
Calcium: 9.9 mg/dL (ref 8.4–10.5)
Chloride: 105 meq/L (ref 96–112)
Creatinine, Ser: 0.82 mg/dL (ref 0.40–1.20)
GFR: 69.07 mL/min
Glucose, Bld: 108 mg/dL — ABNORMAL HIGH (ref 70–99)
Potassium: 3.7 meq/L (ref 3.5–5.1)
Sodium: 142 meq/L (ref 135–145)

## 2022-08-16 LAB — HEPATIC FUNCTION PANEL
ALT: 10 U/L (ref 0–35)
AST: 15 U/L (ref 0–37)
Albumin: 4.1 g/dL (ref 3.5–5.2)
Alkaline Phosphatase: 72 U/L (ref 39–117)
Bilirubin, Direct: 0.1 mg/dL (ref 0.0–0.3)
Total Bilirubin: 0.6 mg/dL (ref 0.2–1.2)
Total Protein: 6.3 g/dL (ref 6.0–8.3)

## 2022-08-17 ENCOUNTER — Ambulatory Visit (INDEPENDENT_AMBULATORY_CARE_PROVIDER_SITE_OTHER): Payer: Medicare HMO | Admitting: Internal Medicine

## 2022-08-17 ENCOUNTER — Encounter: Payer: Self-pay | Admitting: Internal Medicine

## 2022-08-17 VITALS — BP 122/80 | HR 67 | Temp 98.8°F | Ht 65.0 in | Wt 193.9 lb

## 2022-08-17 DIAGNOSIS — I700 Atherosclerosis of aorta: Secondary | ICD-10-CM | POA: Diagnosis not present

## 2022-08-17 DIAGNOSIS — I10 Essential (primary) hypertension: Secondary | ICD-10-CM | POA: Diagnosis not present

## 2022-08-17 DIAGNOSIS — E119 Type 2 diabetes mellitus without complications: Secondary | ICD-10-CM

## 2022-08-17 DIAGNOSIS — E7800 Pure hypercholesterolemia, unspecified: Secondary | ICD-10-CM

## 2022-08-17 DIAGNOSIS — R2689 Other abnormalities of gait and mobility: Secondary | ICD-10-CM

## 2022-08-17 DIAGNOSIS — K219 Gastro-esophageal reflux disease without esophagitis: Secondary | ICD-10-CM | POA: Diagnosis not present

## 2022-08-17 DIAGNOSIS — M79604 Pain in right leg: Secondary | ICD-10-CM

## 2022-08-17 DIAGNOSIS — R413 Other amnesia: Secondary | ICD-10-CM

## 2022-08-17 DIAGNOSIS — F320 Major depressive disorder, single episode, mild: Secondary | ICD-10-CM | POA: Diagnosis not present

## 2022-08-17 DIAGNOSIS — Z23 Encounter for immunization: Secondary | ICD-10-CM | POA: Diagnosis not present

## 2022-08-17 DIAGNOSIS — R69 Illness, unspecified: Secondary | ICD-10-CM | POA: Diagnosis not present

## 2022-08-17 DIAGNOSIS — R200 Anesthesia of skin: Secondary | ICD-10-CM | POA: Diagnosis not present

## 2022-08-17 LAB — VITAMIN B12: Vitamin B-12: 430 pg/mL (ref 211–911)

## 2022-08-17 LAB — TSH: TSH: 2.94 u[IU]/mL (ref 0.35–5.50)

## 2022-08-17 NOTE — Progress Notes (Signed)
Patient ID: Erin Camacho, female   DOB: 03-10-1946, 76 y.o.   MRN: [id number]   Subjective:    Patient ID: Erin Camacho, female    DOB: Jan 14, 1946, 76 y.o.   MRN: [id number]  This visit occurred during the SARS-CoV-2 public health emergency.  Safety protocols were in place, including screening questions prior to the visit, additional usage of staff PPE, and extensive cleaning of exam room while observing appropriate contact time as indicated for disinfecting solutions.   Patient here for  Chief Complaint  Patient presents with   Follow-up    3 month follow up   .   HPI    Past Medical History:  Diagnosis Date   Allergy    hay fever   Anxiety    Arthritis    Chronic back pain    Diabetes mellitus    Dyspnea    a. 07/2017 Echo: EF 60-65%. No rwma. Mildly dil LA. Nl RV fxn.   GERD (gastroesophageal reflux disease)    History of hiatal hernia    Hyperlipidemia    Hypertension    Osteoarthritis    Pneumonia    Spinal stenosis    Type 2 diabetes mellitus ([place])    UTI (lower urinary tract infection)    Past Surgical History:  Procedure Laterality Date   ABDOMINAL HYSTERECTOMY  1973   ABDOMINAL HYSTERECTOMY     APPENDECTOMY     APPLICATION OF ROBOTIC ASSISTANCE FOR SPINAL PROCEDURE N/A 09/15/2018   Procedure: APPLICATION OF ROBOTIC ASSISTANCE FOR SPINAL PROCEDURE;  Surgeon: Kristeen Miss, MD;  Location: [facility];  Service: Neurosurgery;  Laterality: N/A;   BACK SURGERY  04/17/13, 2017, 2019   CATARACT EXTRACTION W/PHACO Left 12/16/2019   Procedure: CATARACT EXTRACTION PHACO AND INTRAOCULAR LENS PLACEMENT (IOC) LEFT PANOPTIX LENS DIABETIC 8.07 01:19.2 10.2%;  Surgeon: Leandrew Koyanagi, MD;  Location: [facility];  Service: Ophthalmology;  Laterality: Left;  Diabetic - oral meds   CATARACT EXTRACTION W/PHACO Right 01/06/2020   Procedure: CATARACT EXTRACTION PHACO AND INTRAOCULAR LENS PLACEMENT (IOC) PANOPTIX LENS RIGHT DIABETIC 10.73 01:08.6 15.7%;  Surgeon: Leandrew Koyanagi, MD;  Location: [facility];  Service: Ophthalmology;  Laterality: Right;  diabetic   CHOLECYSTECTOMY  1995   COLONOSCOPY WITH PROPOFOL N/A 03/15/2021   Procedure: COLONOSCOPY WITH PROPOFOL;  Surgeon: Robert Bellow, MD;  Location: [facility];  Service: Endoscopy;  Laterality: N/A;   ESOPHAGOGASTRODUODENOSCOPY N/A 06/20/2022   Procedure: ESOPHAGOGASTRODUODENOSCOPY (EGD);  Surgeon: Toledo, Benay Pike, MD;  Location: [facility];  Service: Gastroenterology;  Laterality: N/A;   EYE SURGERY  2021   bilateral cataract removal with lens implants   HERNIA REPAIR  08/2006   lap band  08/2003   LUMBAR LAMINECTOMY/ DECOMPRESSION WITH MET-RX Left 12/29/2020   Procedure: Left Thoracic Seven-Eight Microdiscectomy;  Surgeon: Kristeen Miss, MD;  Location: [facility];  Service: Neurosurgery;  Laterality: Left;   SEPTOPLASTY  04/1978   TONSILLECTOMY AND ADENOIDECTOMY  1979   TOTAL HIP ARTHROPLASTY  02/2012   Right, Dr. Marry Guan   TOTAL HIP ARTHROPLASTY Left 08/31/2019   Procedure: TOTAL HIP ARTHROPLASTY;  Surgeon: Dereck Leep, MD;  Location: [facility];  Service: Orthopedics;  Laterality: Left;   UPPER GI ENDOSCOPY  06/20/2022   Family History  Problem Relation Age of Onset   Hypertension Mother    Stroke Mother    Heart disease Mother    Hypertension Father    Stroke Father    Heart disease Father    Arthritis Sister  Diabetes Sister    Cancer Sister    Diabetes Brother    Hypertension Brother    Dementia Brother    Diabetes Brother    Hypertension Brother    Dementia Brother    Breast cancer Other 63   Cancer Other        breast   Diabetes Other    Kidney disease Neg Hx    Bladder Cancer Neg Hx    Social History   Socioeconomic History   Marital status: Married    Spouse name: Not on file   Number of children: Not on file   Years of education: Not on file   Highest education level: Not on file  Occupational History   Not on file  Tobacco Use   Smoking  status: Never   Smokeless tobacco: Never  Vaping Use   Vaping Use: Never used  Substance and Sexual Activity   Alcohol use: Yes    Alcohol/week: 0.0 - 1.0 standard drinks of alcohol    Comment: RARELY   Drug use: No   Sexual activity: Not Currently  Other Topics Concern   Not on file  Social History Narrative   Lives in [place] with husband.      Work - retired Sports administrator   Social Determinants of Radio broadcast assistant Strain: [place]  (03/09/2022)   Overall Financial Resource Strain (CARDIA)    Difficulty of Paying Living Expenses: Not hard at all  Food Insecurity: No Food Insecurity (03/09/2022)   Hunger Vital Sign    Worried About Running Out of Food in the Last Year: Never true    [place] in the Last Year: Never true  Transportation Needs: No Transportation Needs (03/09/2022)   PRAPARE - Hydrologist (Medical): No    Lack of Transportation (Non-Medical): No  Physical Activity: Unknown (03/08/2021)   Exercise Vital Sign    Days of Exercise per Week: 0 days    Minutes of Exercise per Session: Not on file  Stress: No Stress Concern Present (03/09/2022)   [place]    Feeling of Stress : Not at all  Social Connections: Unknown (03/09/2022)   Social Connection and Isolation Panel [NHANES]    Frequency of Communication with Friends and Family: Not on file    Frequency of Social Gatherings with Friends and Family: Not on file    Attends Religious Services: Not on file    Active Member of Clubs or Organizations: Not on file    Attends Archivist Meetings: Not on file    Marital Status: Married     Review of Systems     Objective:     BP 122/80 (BP Location: Left Arm, Patient Position: Sitting, Cuff Size: Normal)   Pulse 67   Temp 98.8 F (37.1 C) (Oral)   Ht $R'5\' 5"'DD$  (1.651 m)   Wt 193 lb 14.4 oz (88 kg)   SpO2 96%   BMI 32.27 kg/m  Wt  Readings from Last 3 Encounters:  08/17/22 193 lb 14.4 oz (88 kg)  07/30/22 188 lb (85.3 kg)  06/20/22 192 lb (87.1 kg)    Physical Exam   Outpatient Encounter Medications as of 08/17/2022  Medication Sig   ALPRAZolam (XANAX) 0.25 MG tablet Take 1 tablet (0.25 mg total) by mouth daily as needed for anxiety.   Azelastine HCl 137 MCG/SPRAY SOLN PLACE 1 SPRAY INTO BOTH NOSTRILS  2 (TWO) TIMES DAILY. USE IN EACH NOSTRIL AS DIRECTED   baclofen (LIORESAL) 10 MG tablet Take 10 mg by mouth 3 (three) times daily.   cholecalciferol (VITAMIN D) 25 MCG (1000 UNIT) tablet Take 1,000 Units by mouth daily.   Ferrous Sulfate (IRON) 325 (65 Fe) MG TABS Take 325 mg by mouth daily.   fexofenadine (ALLEGRA) 180 MG tablet Take 1 tablet (180 mg total) by mouth daily.   fluticasone (FLONASE) 50 MCG/ACT nasal spray SPRAY 2 SPRAYS INTO EACH NOSTRIL EVERY DAY   ketorolac (TORADOL) 10 MG tablet Take 1 tablet (10 mg total) by mouth every 8 (eight) hours as needed.   losartan-hydrochlorothiazide (HYZAAR) 50-12.5 MG tablet Take 1 tablet by mouth daily.   metFORMIN (GLUCOPHAGE) 500 MG tablet TAKE 1 TABLET BY MOUTH EVERY DAY   Multiple Vitamin (MULTIVITAMIN WITH MINERALS) TABS tablet Take 1 tablet by mouth daily.   OneTouch Delica Lancets 62M MISC Use to check blood sugar x per day. DX E11.9.   ONETOUCH VERIO test strip USE TO CHECK BLOOD SUGAR TWICE A DAY   pantoprazole (PROTONIX) 40 MG tablet TAKE 1 TABLET (40 MG TOTAL) BY MOUTH 2 (TWO) TIMES DAILY BEFORE A MEAL.   sertraline (ZOLOFT) 100 MG tablet TAKE 1/2 TABLET BY MOUTH (50 MG) BY MOUTH DAILY   simvastatin (ZOCOR) 10 MG tablet TAKE 1/2 TABLET (5 MG TOTAL) BY MOUTH AT BEDTIME.   traMADol (ULTRAM) 50 MG tablet Take 1-2 tablets as needed for severe pain. Not to exceed two doses (4 tablets, 200 mg) daily   acetaminophen (TYLENOL) 650 MG CR tablet Take 1,950 mg by mouth in the morning and at bedtime. (Patient not taking: Reported on 08/17/2022)   No facility-administered  encounter medications on file as of 08/17/2022.     Lab Results  Component Value Date   WBC 5.5 07/30/2022   HGB 12.4 07/30/2022   HCT 39.2 07/30/2022   PLT 174 07/30/2022   GLUCOSE 108 (H) 08/16/2022   CHOL 159 08/16/2022   TRIG 64.0 08/16/2022   HDL 62.70 08/16/2022   LDLDIRECT 146.5 09/19/2012   LDLCALC 83 08/16/2022   ALT 10 08/16/2022   AST 15 08/16/2022   NA 142 08/16/2022   K 3.7 08/16/2022   CL 105 08/16/2022   CREATININE 0.82 08/16/2022   BUN 20 08/16/2022   CO2 30 08/16/2022   TSH 2.24 04/12/2022   INR 1.0 08/21/2019   HGBA1C 6.2 08/16/2022   MICROALBUR 1.2 05/17/2022    CT Head Wo Contrast  Result Date: 07/30/2022 CLINICAL DATA:  Headache, new or worsening. Dizziness over last 2 days. EXAM: CT HEAD WITHOUT CONTRAST TECHNIQUE: Contiguous axial images were obtained from the base of the skull through the vertex without intravenous contrast. RADIATION DOSE REDUCTION: This exam was performed according to the departmental dose-optimization program which includes automated exposure control, adjustment of the mA and/or kV according to patient size and/or use of iterative reconstruction technique. COMPARISON:  MRI examination dated February 01, 2022 FINDINGS: Brain: No evidence of acute infarction, hemorrhage, hydrocephalus, extra-axial collection or mass lesion/mass effect. Patchy area of low-attenuation of the periventricular white matter presumed chronic microvascular ischemic changes. Vascular: No hyperdense vessel or unexpected calcification. Skull: Normal. Negative for fracture or focal lesion. Sinuses/Orbits: Mucosal thickening of the right maxillary sinus. Patchy opacification of the ethmoid air cells. Remaining paranasal sinuses and mastoid air cells are clear. Bilateral cataract surgery. Other: None. IMPRESSION: 1.  No acute intracranial abnormality. 2. Mild chronic microvascular ischemic changes of the white  matter, unchanged. 3.  Paranasal sinus disease. Electronically Signed    By: Keane Police D.O.   On: 07/30/2022 11:07       Assessment & Plan:   Problem List Items Addressed This Visit   None    [name], MD

## 2022-08-19 NOTE — Telephone Encounter (Signed)
Okay to refill?  No longer on medication list  The original prescription was discontinued on 06/15/2022 by Roemhildt, Lorin T, PA-C for the following reason: Patient Preference. Renewing this prescription may not be appropriate.

## 2022-08-20 ENCOUNTER — Encounter: Payer: Self-pay | Admitting: Internal Medicine

## 2022-08-20 DIAGNOSIS — R200 Anesthesia of skin: Secondary | ICD-10-CM | POA: Insufficient documentation

## 2022-08-20 NOTE — Assessment & Plan Note (Signed)
Low carb diet and exercise.  Follow met b and a1c.   Follow.  Stay hydrated.   Lab Results  Component Value Date   HGBA1C 6.2 08/16/2022

## 2022-08-20 NOTE — Assessment & Plan Note (Signed)
No acid reflux reported.  On protonix.  

## 2022-08-20 NOTE — Assessment & Plan Note (Signed)
Increased right leg pain and numbness.  Has known DDD.  Has seen Dr Ellene Route.  Injection.  Discussed f/u.  Continues on gabapentin as outlined.

## 2022-08-20 NOTE — Assessment & Plan Note (Signed)
Increased numbness - to knee. Also with unsteady gait.  Question of some neuropathy.  Has known back issues.  Discussed further w/up.  Discussed f/u with Dr Ellene Route.  Discussed NCS.

## 2022-08-20 NOTE — Telephone Encounter (Signed)
Please confirm with Erin Camacho if she is still taking trazodone.  Had been previously taken off her med list.

## 2022-08-20 NOTE — Assessment & Plan Note (Signed)
Reports noticing some balance issues.  More unsteady - gait.  Discussed possible neuropathy and back issues.  Discussed core strengthening and PT to help with her gait and balance.  Agreeable for PT referral.

## 2022-08-20 NOTE — Assessment & Plan Note (Signed)
Continue simvastatin.  Low cholesterol diet and exercise.  Follow lipid panel and liver function tests.   

## 2022-08-20 NOTE — Assessment & Plan Note (Signed)
Continue on zoloft.  Stable.  Follow.

## 2022-08-20 NOTE — Assessment & Plan Note (Signed)
Continue simvastatin. 

## 2022-08-20 NOTE — Assessment & Plan Note (Signed)
Is concerned she is not remembering as well.  Check TSH and B12.  Sees Dr Manuella Ghazi. Discussed further w/up.  Follow.

## 2022-08-20 NOTE — Assessment & Plan Note (Signed)
Blood pressure as outlined.  Continue losartan/hctz.  Follow pressures.  Follow metabolic panel.  

## 2022-08-21 DIAGNOSIS — J301 Allergic rhinitis due to pollen: Secondary | ICD-10-CM | POA: Diagnosis not present

## 2022-08-21 NOTE — Telephone Encounter (Signed)
I called and spoke with patient. She stated that she is still taking the trazodone nightly for sleep & she did ask that you refill.

## 2022-08-22 DIAGNOSIS — B372 Candidiasis of skin and nail: Secondary | ICD-10-CM | POA: Diagnosis not present

## 2022-08-22 DIAGNOSIS — Z872 Personal history of diseases of the skin and subcutaneous tissue: Secondary | ICD-10-CM | POA: Diagnosis not present

## 2022-08-22 DIAGNOSIS — L821 Other seborrheic keratosis: Secondary | ICD-10-CM | POA: Diagnosis not present

## 2022-08-22 DIAGNOSIS — L578 Other skin changes due to chronic exposure to nonionizing radiation: Secondary | ICD-10-CM | POA: Diagnosis not present

## 2022-08-22 DIAGNOSIS — L570 Actinic keratosis: Secondary | ICD-10-CM | POA: Diagnosis not present

## 2022-08-22 NOTE — Telephone Encounter (Signed)
Rx ok'd for trazodone #90 with one refill.  

## 2022-08-28 DIAGNOSIS — J301 Allergic rhinitis due to pollen: Secondary | ICD-10-CM | POA: Diagnosis not present

## 2022-09-11 DIAGNOSIS — J301 Allergic rhinitis due to pollen: Secondary | ICD-10-CM | POA: Diagnosis not present

## 2022-09-17 DIAGNOSIS — M5414 Radiculopathy, thoracic region: Secondary | ICD-10-CM | POA: Diagnosis not present

## 2022-09-17 DIAGNOSIS — M5114 Intervertebral disc disorders with radiculopathy, thoracic region: Secondary | ICD-10-CM | POA: Diagnosis not present

## 2022-09-18 ENCOUNTER — Other Ambulatory Visit: Payer: Self-pay | Admitting: Internal Medicine

## 2022-09-18 DIAGNOSIS — Z1231 Encounter for screening mammogram for malignant neoplasm of breast: Secondary | ICD-10-CM

## 2022-09-18 DIAGNOSIS — J301 Allergic rhinitis due to pollen: Secondary | ICD-10-CM | POA: Diagnosis not present

## 2022-09-28 DIAGNOSIS — H26493 Other secondary cataract, bilateral: Secondary | ICD-10-CM | POA: Diagnosis not present

## 2022-09-28 IMAGING — MR MR THORACIC SPINE W/O CM
4 of 6 series · 21 of 48 positions shown · non-contrast
Comparison: Thoracic spine radiographs 04/20/2020. Thoracic and
lumbar spine CT 08/06/2018.

CLINICAL DATA: Chronic low back and bilateral leg pain.

EXAM:
MRI THORACIC SPINE WITHOUT CONTRAST
TECHNIQUE: Multiplanar, multisequence MR imaging of the thoracic spine was
performed. No intravenous contrast was administered.

[Series 4: T2 · coronal · 5.0mm · 1.76mm/px · 9 of 30 slices shown (1 of 2)]
[im 1/30]
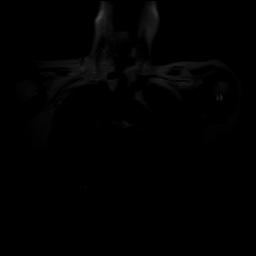
[im 4/30]
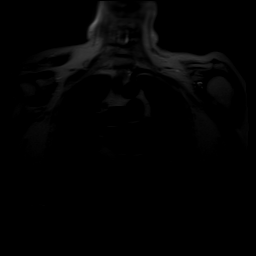
[im 8/30]
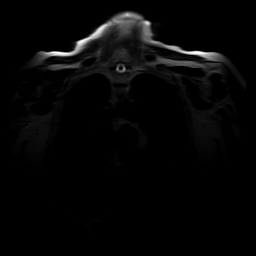
[im 11/30]
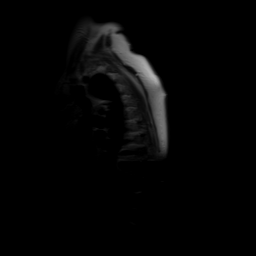
[im 15/30]
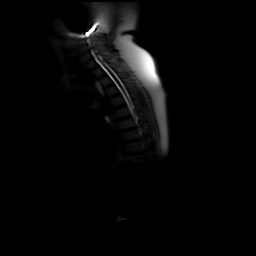
[im 19/30]
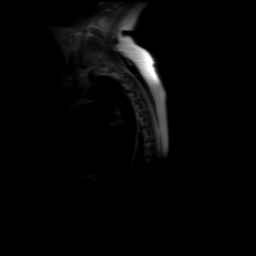
[im 22/30]
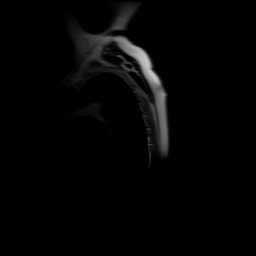
[im 26/30]
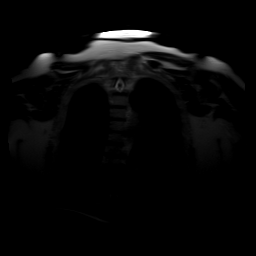
[im 30/30]
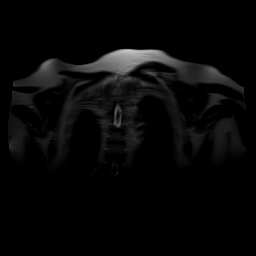

[Series 6: T2 post-contrast · sagittal · 3.0mm · 0.55mm/px · 5 of 19 slices shown]
[im 1/19]
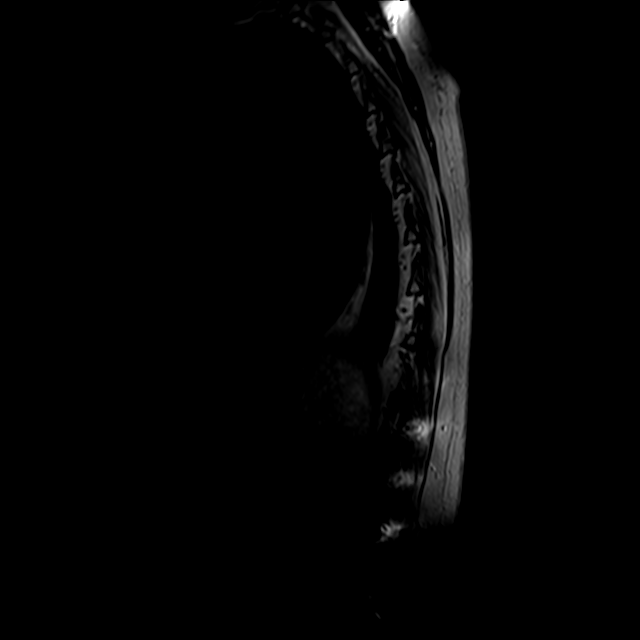
[im 5/19]
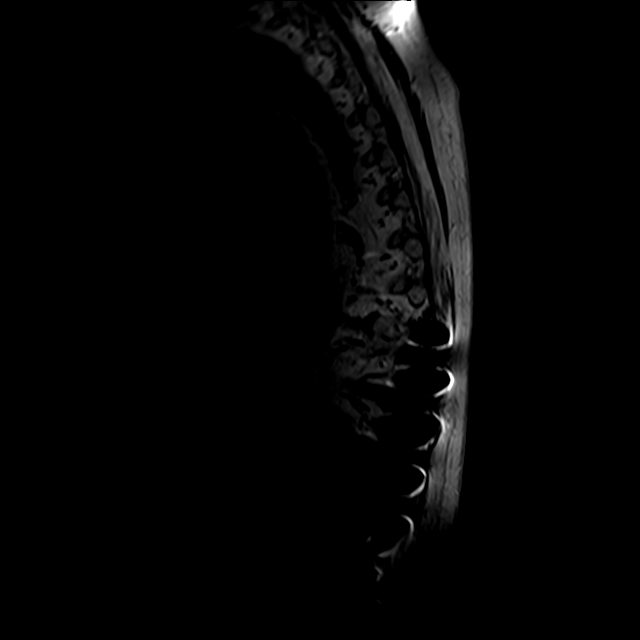
[im 10/19]
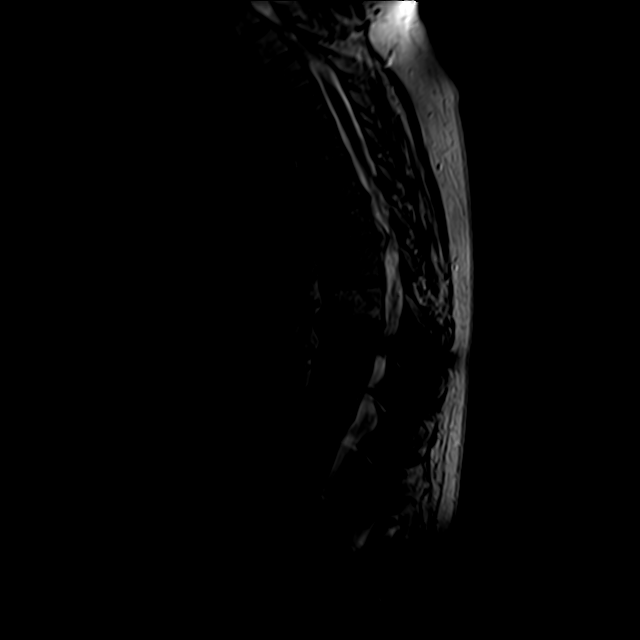
[im 14/19]
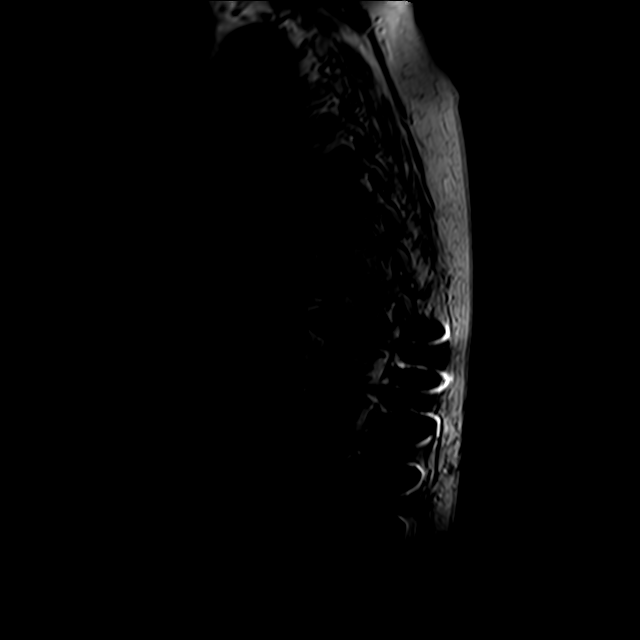
[im 19/19]
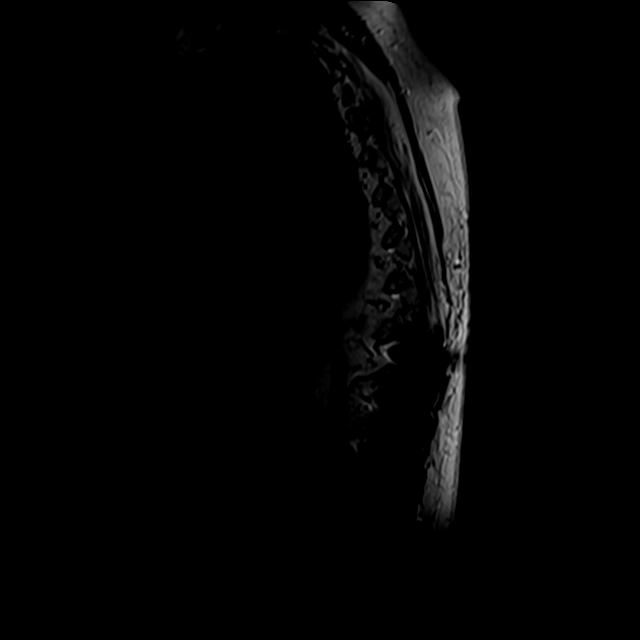

[Series 7: T1 · sagittal · 3.0mm · 0.55mm/px · 3 of 19 slices shown]
[im 1/19]
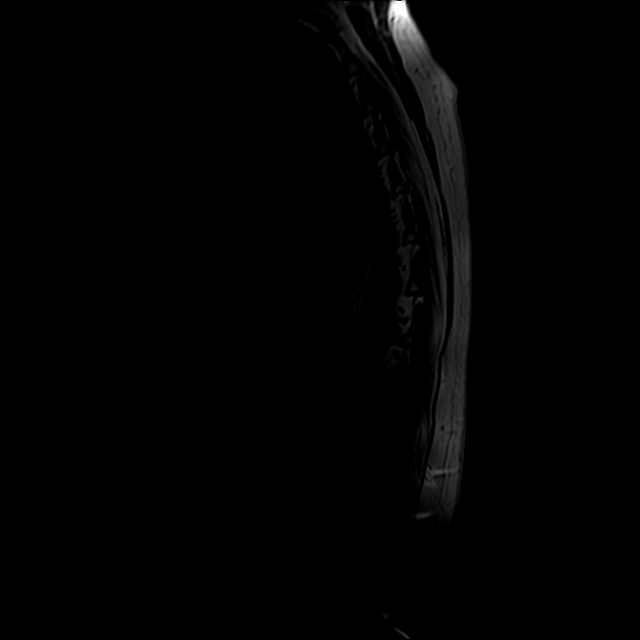
[im 10/19]
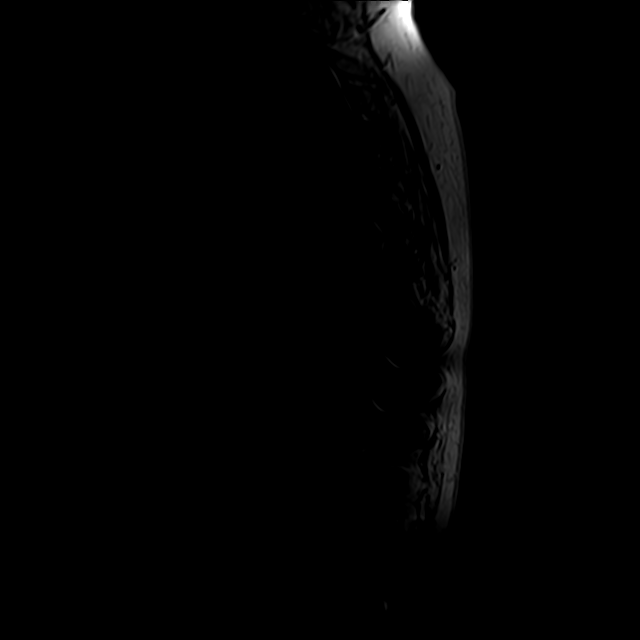
[im 19/19]
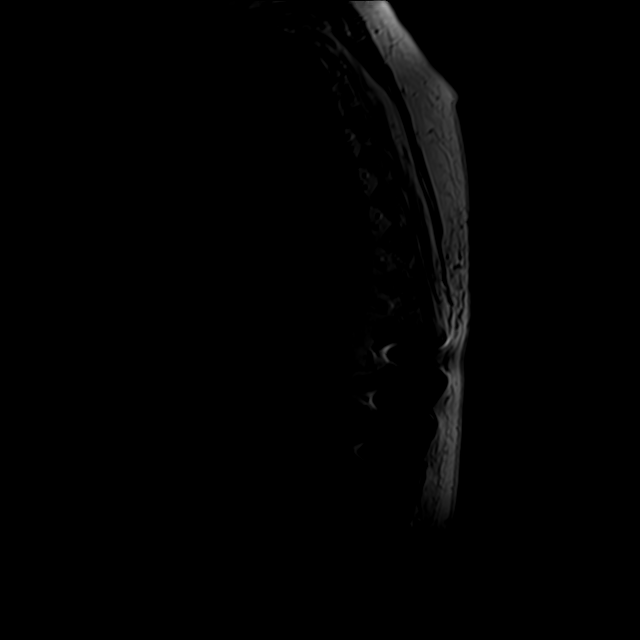

[Series 8: T2 · axial · 4.0mm · 0.39mm/px · z∈[-265,-92]mm · 4 of 42 slices shown (2 of 2)]
[im 1/42]
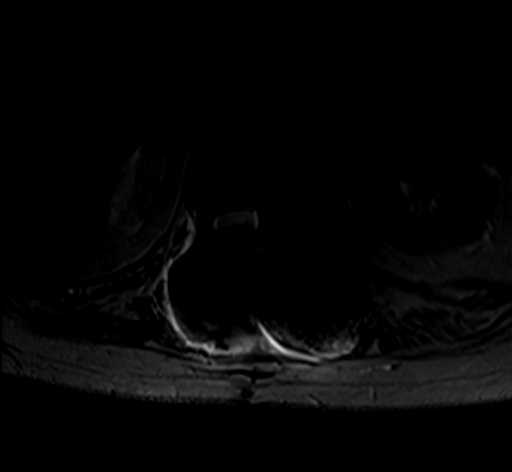
[im 8/42]
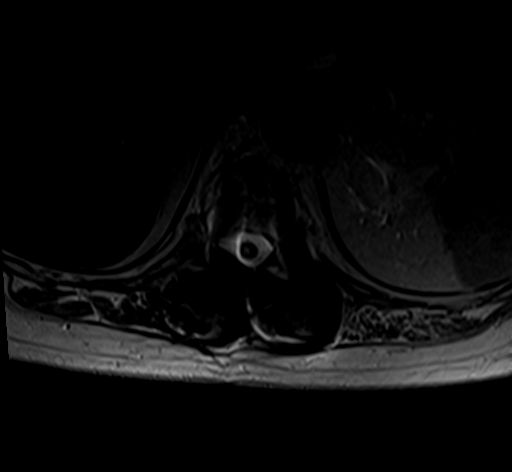
[im 23/42]
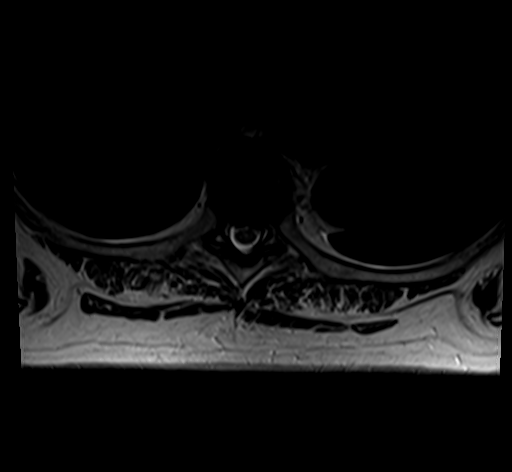
[im 38/42]
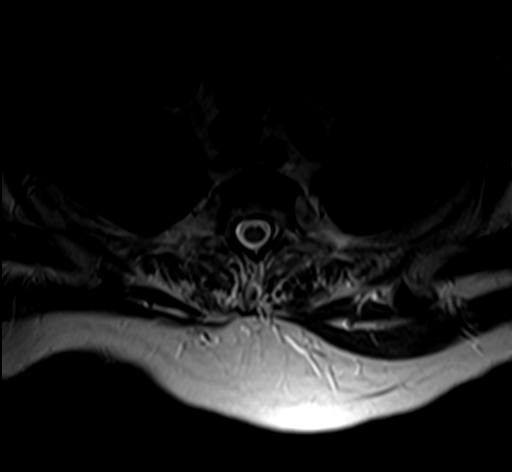

[21 of 48 positions shown; findings below may reference images not displayed]

FINDINGS: Alignment:  Trace retrolisthesis of T11 on T12.

Vertebrae: No fracture or suspicious osseous lesion. Posterior
spinal fusion extending from T10 into the lumbar spine. Disc space
narrowing at T9-10 with prominent endplate edema favored to be
degenerative rather than infectious.

Cord: Small lower thoracic spinal cord syrinx with maximal diameter
of 1-1.5 mm at T9.

Paraspinal and other soft tissues: No paraspinal soft tissue edema
or fluid collection. Lap band.

Disc levels:

T5-6: A moderate-sized right paracentral disc protrusion results in
mild spinal stenosis without spinal cord mass effect. Patent neural
foramina.

T6-7: Disc bulging, a small right paracentral disc protrusion, and
mild facet hypertrophy result in mild spinal stenosis without spinal
cord mass effect. Patent neural foramina.

T7-8: A large central and left paracentral disc extrusion and mild
facet hypertrophy result in moderate to severe spinal stenosis with
moderate to severe cord flattening. Patent neural foramina.

T8-9: A moderately large central disc extrusion with mild superior
migration and mild facet hypertrophy result in mild to moderate
spinal stenosis and mild cord flattening. Patent neural foramina.

T9-10: Disc bulging and moderate facet hypertrophy result in mild
spinal stenosis and mild right neural foraminal stenosis without
spinal cord mass effect.

T10-11: Prior posterior fusion. Tiny left central disc protrusion
without stenosis.

T11-12 and T12-L1: Prior posterior decompression. No disc herniation
or stenosis.
IMPRESSION: 1. Large disc extrusion at T7-8 with moderate to severe spinal
stenosis.
2. Mild to moderate spinal stenosis at other levels due to disc
herniations and posterior element hypertrophy.
3. Posterior spinal fusion from T10 into the lumbar spine.

## 2022-10-02 DIAGNOSIS — J301 Allergic rhinitis due to pollen: Secondary | ICD-10-CM | POA: Diagnosis not present

## 2022-10-09 DIAGNOSIS — J301 Allergic rhinitis due to pollen: Secondary | ICD-10-CM | POA: Diagnosis not present

## 2022-10-10 DIAGNOSIS — E119 Type 2 diabetes mellitus without complications: Secondary | ICD-10-CM | POA: Diagnosis not present

## 2022-10-10 DIAGNOSIS — M7671 Peroneal tendinitis, right leg: Secondary | ICD-10-CM | POA: Diagnosis not present

## 2022-10-10 DIAGNOSIS — M7741 Metatarsalgia, right foot: Secondary | ICD-10-CM | POA: Diagnosis not present

## 2022-10-10 DIAGNOSIS — M79671 Pain in right foot: Secondary | ICD-10-CM | POA: Diagnosis not present

## 2022-10-10 DIAGNOSIS — M19071 Primary osteoarthritis, right ankle and foot: Secondary | ICD-10-CM | POA: Diagnosis not present

## 2022-10-12 ENCOUNTER — Other Ambulatory Visit: Payer: Self-pay | Admitting: Family

## 2022-10-16 DIAGNOSIS — J301 Allergic rhinitis due to pollen: Secondary | ICD-10-CM | POA: Diagnosis not present

## 2022-10-22 DIAGNOSIS — H26492 Other secondary cataract, left eye: Secondary | ICD-10-CM | POA: Diagnosis not present

## 2022-10-22 DIAGNOSIS — J301 Allergic rhinitis due to pollen: Secondary | ICD-10-CM | POA: Diagnosis not present

## 2022-10-23 DIAGNOSIS — J301 Allergic rhinitis due to pollen: Secondary | ICD-10-CM | POA: Diagnosis not present

## 2022-10-29 NOTE — Progress Notes (Unsigned)
[facility] Office Note  Date:  10/30/2022   ID:  Erin Camacho, DOB 11-26-1945, MRN [id number]  PCP:  [name], MD   Chief Complaint  Patient presents with   12 month follow up     "Doing well." Medications reviewed by the patient verbally.     HPI:  76 year old woman with history of Hypertension Hyperlipidemia Borderline diabetes Non smoker, GERD Anxiety obesity Total hip replacement, 2013 Chronic low back pain 2015, 2017 History of bariatric surgery, LAP band, lost 70 pounds Who presents for follow-up of her shortness of breath with exertion  Last seen in clinic by myself  Was undergoing back surgery at the time Breathing was stable  Husband fell x2 this year, Doing more Balance issues Sore ankle on right No exercise  Lasb reviewed A1C 6.2 Total chol 159, LDL 83  Carotid u/s Minor carotid atherosclerosis and intimal thickening.  Degree of narrowing less than 50% bilaterally by ultrasound criteria.  EKG personally reviewed by myself on todays visit NSR, rate 59 bpm , LAD, no change  Previous imaging studies reviewed CT scan Atherosclerotic calcification in the aorta and iliacs without aneurysm. Very mild  Carotid ultrasound July 2018 Less than 39% bilaterally  EKG personally reviewed by myself on todays visit Shows normal sinus rhythm rate 59 bpm left anterior fascicular block  Echocardiogram September 2018, normal ejection fraction 60%  PMH:   has a past medical history of Allergy, Anxiety, Arthritis, Chronic back pain, Diabetes mellitus, Dyspnea, GERD (gastroesophageal reflux disease), History of hiatal hernia, Hyperlipidemia, Hypertension, Osteoarthritis, Pneumonia, Spinal stenosis, Type 2 diabetes mellitus ([place]), and UTI (lower urinary tract infection).  PSH:    Past Surgical History:  Procedure Laterality Date   ABDOMINAL HYSTERECTOMY  1973   ABDOMINAL HYSTERECTOMY     APPENDECTOMY     APPLICATION OF ROBOTIC ASSISTANCE FOR SPINAL  PROCEDURE N/A 09/15/2018   Procedure: APPLICATION OF ROBOTIC ASSISTANCE FOR SPINAL PROCEDURE;  Surgeon: Kristeen Miss, MD;  Location: [facility];  Service: Neurosurgery;  Laterality: N/A;   BACK SURGERY  04/17/13, 2017, 2019   CATARACT EXTRACTION W/PHACO Left 12/16/2019   Procedure: CATARACT EXTRACTION PHACO AND INTRAOCULAR LENS PLACEMENT (IOC) LEFT PANOPTIX LENS DIABETIC 8.07 01:19.2 10.2%;  Surgeon: Leandrew Koyanagi, MD;  Location: [facility];  Service: Ophthalmology;  Laterality: Left;  Diabetic - oral meds   CATARACT EXTRACTION W/PHACO Right 01/06/2020   Procedure: CATARACT EXTRACTION PHACO AND INTRAOCULAR LENS PLACEMENT (IOC) PANOPTIX LENS RIGHT DIABETIC 10.73 01:08.6 15.7%;  Surgeon: Leandrew Koyanagi, MD;  Location: [facility];  Service: Ophthalmology;  Laterality: Right;  diabetic   CHOLECYSTECTOMY  1995   COLONOSCOPY WITH PROPOFOL N/A 03/15/2021   Procedure: COLONOSCOPY WITH PROPOFOL;  Surgeon: Robert Bellow, MD;  Location: [facility];  Service: Endoscopy;  Laterality: N/A;   ESOPHAGOGASTRODUODENOSCOPY N/A 06/20/2022   Procedure: ESOPHAGOGASTRODUODENOSCOPY (EGD);  Surgeon: Toledo, Benay Pike, MD;  Location: [facility];  Service: Gastroenterology;  Laterality: N/A;   EYE SURGERY  2021   bilateral cataract removal with lens implants   HERNIA REPAIR  08/2006   lap band  08/2003   LUMBAR LAMINECTOMY/ DECOMPRESSION WITH MET-RX Left 12/29/2020   Procedure: Left Thoracic Seven-Eight Microdiscectomy;  Surgeon: Kristeen Miss, MD;  Location: [facility];  Service: Neurosurgery;  Laterality: Left;   SEPTOPLASTY  04/1978   TONSILLECTOMY AND ADENOIDECTOMY  1979   TOTAL HIP ARTHROPLASTY  02/2012   Right, Dr. Marry Guan   TOTAL HIP ARTHROPLASTY Left 08/31/2019   Procedure: TOTAL HIP ARTHROPLASTY;  Surgeon: Marry Guan,  Laurice Record, MD;  Location: [facility];  Service: Orthopedics;  Laterality: Left;   UPPER GI ENDOSCOPY  06/20/2022    Current Outpatient Medications  Medication Sig  Dispense Refill   ALPRAZolam (XANAX) 0.25 MG tablet Take 1 tablet (0.25 mg total) by mouth daily as needed for anxiety. 30 tablet 0   Azelastine HCl 137 MCG/SPRAY SOLN PLACE 1 SPRAY INTO BOTH NOSTRILS 2 (TWO) TIMES DAILY. USE IN EACH NOSTRIL AS DIRECTED 30 mL 2   cholecalciferol (VITAMIN D) 25 MCG (1000 UNIT) tablet Take 1,000 Units by mouth daily.     Ferrous Sulfate (IRON) 325 (65 Fe) MG TABS Take 325 mg by mouth daily.     fexofenadine (ALLEGRA) 180 MG tablet Take 1 tablet (180 mg total) by mouth daily. 90 tablet 4   fluticasone (FLONASE) 50 MCG/ACT nasal spray SPRAY 2 SPRAYS INTO EACH NOSTRIL EVERY DAY 48 mL 1   gabapentin (NEURONTIN) 300 MG capsule Take 300 mg by mouth 2 (two) times daily.     lidocaine (LIDODERM) 5 % SMARTSIG:2 Patch(s) T-DERMAL Daily     losartan-hydrochlorothiazide (HYZAAR) 50-12.5 MG tablet Take 1 tablet by mouth daily. 90 tablet 2   metFORMIN (GLUCOPHAGE) 500 MG tablet TAKE 1 TABLET BY MOUTH EVERY DAY 90 tablet 1   Multiple Vitamin (MULTIVITAMIN WITH MINERALS) TABS tablet Take 1 tablet by mouth daily.     OneTouch Delica Lancets 33I MISC Use to check blood sugar x per day. DX E11.9. 100 each 3   ONETOUCH VERIO test strip USE TO CHECK BLOOD SUGAR TWICE A DAY 100 strip 12   pantoprazole (PROTONIX) 40 MG tablet TAKE 1 TABLET (40 MG TOTAL) BY MOUTH 2 (TWO) TIMES DAILY BEFORE A MEAL. 180 tablet 3   sertraline (ZOLOFT) 100 MG tablet TAKE 1/2 TABLET BY MOUTH (50 MG) BY MOUTH DAILY 45 tablet 1   simvastatin (ZOCOR) 10 MG tablet TAKE 1/2 TABLET (5 MG TOTAL) BY MOUTH AT BEDTIME. 45 tablet 3   traMADol (ULTRAM) 50 MG tablet Take 1-2 tablets as needed for severe pain. Not to exceed two doses (4 tablets, 200 mg) daily 15 tablet 0   traZODone (DESYREL) 100 MG tablet TAKE 1 TABLET BY MOUTH EVERYDAY AT BEDTIME 90 tablet 1   baclofen (LIORESAL) 10 MG tablet Take 10 mg by mouth 3 (three) times daily. (Patient not taking: Reported on 10/30/2022)     ketorolac (TORADOL) 10 MG tablet Take 1  tablet (10 mg total) by mouth every 8 (eight) hours as needed. (Patient not taking: Reported on 10/30/2022) 12 tablet 0   No current facility-administered medications for this visit.     Allergies:   Adhesive [tape]   Social History:  The patient  reports that she has never smoked. She has never used smokeless tobacco. She reports current alcohol use. She reports that she does not use drugs.   Family History:   family history includes Arthritis in her sister; Breast cancer (age of onset: 53) in an other family member; Cancer in her sister and another family member; Dementia in her brother and brother; Diabetes in her brother, brother, sister, and another family member; Heart disease in her father and mother; Hypertension in her brother, brother, father, and mother; Stroke in her father and mother.    Review of Systems: Review of Systems  Constitutional: Negative.   HENT: Negative.    Respiratory: Negative.    Cardiovascular: Negative.   Gastrointestinal: Negative.   Musculoskeletal:  Positive for back pain.  Neurological: Negative.  Psychiatric/Behavioral: Negative.    All other systems reviewed and are negative.   PHYSICAL EXAM: VS:  BP 130/70 (BP Location: Left Arm, Patient Position: Sitting, Cuff Size: Normal)   Pulse (!) 59   Ht _0  (1.651 m)   Wt 197 lb 8 oz (89.6 kg)   SpO2 98%   BMI 32.87 kg/m  , BMI Body mass index is 32.87 kg/m. Constitutional:  oriented to person, place, and time. No distress.  HENT:  Head: Grossly normal Eyes:  no discharge. No scleral icterus.  Neck: No JVD, no carotid bruits  Cardiovascular: Regular rate and rhythm, no murmurs appreciated Pulmonary/Chest: Clear to auscultation bilaterally, no wheezes or rails Abdominal: Soft.  no distension.  no tenderness.  Musculoskeletal: Normal range of motion Neurological:  normal muscle tone. Coordination normal. No atrophy Skin: Skin warm and dry Psychiatric: normal affect, pleasant  Recent  Labs: 07/30/2022: Hemoglobin 12.4; Platelets 174 08/16/2022: ALT 10; BUN 20; Creatinine, Ser 0.82; Potassium 3.7; Sodium 142 08/17/2022: TSH 2.94    Lipid Panel Lab Results  Component Value Date   CHOL 159 08/16/2022   HDL 62.70 08/16/2022   LDLCALC 83 08/16/2022   TRIG 64.0 08/16/2022      Wt Readings from Last 3 Encounters:  10/30/22 197 lb 8 oz (89.6 kg)  08/17/22 193 lb 14.4 oz (88 kg)  07/30/22 188 lb (85.3 kg)     ASSESSMENT AND PLAN:  Aortic atherosclerosis ([place]) - Plan: EKG 12-Lead CT scan showing minimal aortic atherosclerosis noted Continue simvastatin Cholesterol reasonable, 159  Shortness of breath - Plan: EKG 12-Lead secondary to deconditioning, obesity Weight trending higher, dietary discretion Completed back surgery, recommended regular walking program  Essential hypertension - Plan: EKG 12-Lead Blood pressure is well controlled on today's visit. No changes made to the medications.  Hypercholesterolemia Reasonable numbers on simvastatin  Diabetes mellitus type 2, diet-controlled (HCC) Hemoglobin A1 6 Recommend low carbohydrate diet, walking program  Morbid  obesity Weight trending upwards Walking program for conditioning    Total encounter time more than 30 minutes  Greater than 50% was spent in counseling and coordination of care with the patient   No orders of the defined types were placed in this encounter.    Signed, Esmond Plants, M.D., Ph.D. 10/30/2022  [place], [place]

## 2022-10-30 ENCOUNTER — Ambulatory Visit: Payer: Medicare HMO | Attending: Cardiovascular Disease | Admitting: Cardiovascular Disease

## 2022-10-30 ENCOUNTER — Encounter: Payer: Self-pay | Admitting: Cardiovascular Disease

## 2022-10-30 VITALS — BP 130/70 | HR 59 | Ht 65.0 in | Wt 197.5 lb

## 2022-10-30 DIAGNOSIS — I10 Essential (primary) hypertension: Secondary | ICD-10-CM

## 2022-10-30 DIAGNOSIS — I6523 Occlusion and stenosis of bilateral carotid arteries: Secondary | ICD-10-CM | POA: Diagnosis not present

## 2022-10-30 DIAGNOSIS — E782 Mixed hyperlipidemia: Secondary | ICD-10-CM | POA: Diagnosis not present

## 2022-10-30 DIAGNOSIS — J301 Allergic rhinitis due to pollen: Secondary | ICD-10-CM | POA: Diagnosis not present

## 2022-10-30 DIAGNOSIS — I700 Atherosclerosis of aorta: Secondary | ICD-10-CM

## 2022-10-30 NOTE — Patient Instructions (Signed)
Medication Instructions:  No changes  If you need a refill on your cardiac medications before your next appointment, please call your pharmacy.   Lab work: No new labs needed  Testing/Procedures: No new testing needed  Follow-Up: At [facility], you and your health needs are our priority.  As part of our continuing mission to provide you with exceptional heart care, we have created designated Provider Care Teams.  These Care Teams include your primary Cardiologist (physician) and Advanced Practice Providers (APPs -  Physician Assistants and Nurse Practitioners) who all work together to provide you with the care you need, when you need it.  You will need a follow up appointment in 12 months  Providers on your designated Care Team:   Christopher Berge, NP Ryan Dunn, PA-C Cadence Furth, PA-C  COVID-19 Vaccine Information can be found at: [email or web address] For questions related to vaccine distribution or appointments, please email [email or web address] or call [phone].   

## 2022-11-02 DIAGNOSIS — M19071 Primary osteoarthritis, right ankle and foot: Secondary | ICD-10-CM | POA: Diagnosis not present

## 2022-11-02 DIAGNOSIS — M7741 Metatarsalgia, right foot: Secondary | ICD-10-CM | POA: Diagnosis not present

## 2022-11-02 DIAGNOSIS — M7671 Peroneal tendinitis, right leg: Secondary | ICD-10-CM | POA: Diagnosis not present

## 2022-11-02 DIAGNOSIS — E119 Type 2 diabetes mellitus without complications: Secondary | ICD-10-CM | POA: Diagnosis not present

## 2022-11-02 DIAGNOSIS — M79671 Pain in right foot: Secondary | ICD-10-CM | POA: Diagnosis not present

## 2022-11-05 DIAGNOSIS — H0014 Chalazion left upper eyelid: Secondary | ICD-10-CM | POA: Diagnosis not present

## 2022-11-06 DIAGNOSIS — J301 Allergic rhinitis due to pollen: Secondary | ICD-10-CM | POA: Diagnosis not present

## 2022-11-13 DIAGNOSIS — M13862 Other specified arthritis, left knee: Secondary | ICD-10-CM | POA: Diagnosis not present

## 2022-11-13 DIAGNOSIS — M25562 Pain in left knee: Secondary | ICD-10-CM | POA: Diagnosis not present

## 2022-11-14 ENCOUNTER — Ambulatory Visit: Payer: Medicare HMO

## 2022-11-14 ENCOUNTER — Ambulatory Visit
Admission: RE | Admit: 2022-11-14 | Discharge: 2022-11-14 | Disposition: A | Discharge status: Home / Self Care | Payer: Medicare HMO | Source: Ambulatory Visit | Attending: Internal Medicine | Admitting: Internal Medicine

## 2022-11-14 DIAGNOSIS — Z1231 Encounter for screening mammogram for malignant neoplasm of breast: Secondary | ICD-10-CM | POA: Insufficient documentation

## 2022-11-20 DIAGNOSIS — J301 Allergic rhinitis due to pollen: Secondary | ICD-10-CM | POA: Diagnosis not present

## 2022-11-27 ENCOUNTER — Other Ambulatory Visit (INDEPENDENT_AMBULATORY_CARE_PROVIDER_SITE_OTHER): Payer: Medicare HMO

## 2022-11-27 DIAGNOSIS — E119 Type 2 diabetes mellitus without complications: Secondary | ICD-10-CM

## 2022-11-27 DIAGNOSIS — J301 Allergic rhinitis due to pollen: Secondary | ICD-10-CM | POA: Diagnosis not present

## 2022-11-27 DIAGNOSIS — E7800 Pure hypercholesterolemia, unspecified: Secondary | ICD-10-CM

## 2022-11-27 LAB — BASIC METABOLIC PANEL WITH GFR
BUN: 19 mg/dL (ref 6–23)
CO2: 32 meq/L (ref 19–32)
Calcium: 10.3 mg/dL (ref 8.4–10.5)
Chloride: 103 meq/L (ref 96–112)
Creatinine, Ser: 0.88 mg/dL (ref 0.40–1.20)
GFR: 63.33 mL/min
Glucose, Bld: 104 mg/dL — ABNORMAL HIGH (ref 70–99)
Potassium: 4 meq/L (ref 3.5–5.1)
Sodium: 142 meq/L (ref 135–145)

## 2022-11-27 LAB — HEPATIC FUNCTION PANEL
ALT: 12 U/L (ref 0–35)
AST: 16 U/L (ref 0–37)
Albumin: 4.4 g/dL (ref 3.5–5.2)
Alkaline Phosphatase: 70 U/L (ref 39–117)
Bilirubin, Direct: 0.1 mg/dL (ref 0.0–0.3)
Total Bilirubin: 0.5 mg/dL (ref 0.2–1.2)
Total Protein: 6.7 g/dL (ref 6.0–8.3)

## 2022-11-27 LAB — LIPID PANEL
Cholesterol: 178 mg/dL (ref 0–200)
HDL: 86 mg/dL
LDL Cholesterol: 79 mg/dL (ref 0–99)
NonHDL: 91.73
Total CHOL/HDL Ratio: 2
Triglycerides: 66 mg/dL (ref 0.0–149.0)
VLDL: 13.2 mg/dL (ref 0.0–40.0)

## 2022-11-27 LAB — HEMOGLOBIN A1C: Hgb A1c MFr Bld: 6.1 % (ref 4.6–6.5)

## 2022-11-28 ENCOUNTER — Encounter: Payer: Self-pay | Admitting: Internal Medicine

## 2022-11-28 ENCOUNTER — Ambulatory Visit (INDEPENDENT_AMBULATORY_CARE_PROVIDER_SITE_OTHER): Payer: Medicare HMO | Admitting: Internal Medicine

## 2022-11-28 VITALS — BP 126/76 | HR 58 | Temp 98.1°F | Resp 15 | Ht 65.0 in | Wt 195.2 lb

## 2022-11-28 DIAGNOSIS — M79605 Pain in left leg: Secondary | ICD-10-CM

## 2022-11-28 DIAGNOSIS — M545 Low back pain, unspecified: Secondary | ICD-10-CM

## 2022-11-28 DIAGNOSIS — F320 Major depressive disorder, single episode, mild: Secondary | ICD-10-CM

## 2022-11-28 DIAGNOSIS — E119 Type 2 diabetes mellitus without complications: Secondary | ICD-10-CM | POA: Diagnosis not present

## 2022-11-28 DIAGNOSIS — I10 Essential (primary) hypertension: Secondary | ICD-10-CM | POA: Diagnosis not present

## 2022-11-28 DIAGNOSIS — I700 Atherosclerosis of aorta: Secondary | ICD-10-CM

## 2022-11-28 DIAGNOSIS — G8929 Other chronic pain: Secondary | ICD-10-CM | POA: Diagnosis not present

## 2022-11-28 DIAGNOSIS — R69 Illness, unspecified: Secondary | ICD-10-CM | POA: Diagnosis not present

## 2022-11-28 DIAGNOSIS — K219 Gastro-esophageal reflux disease without esophagitis: Secondary | ICD-10-CM

## 2022-11-28 DIAGNOSIS — E7800 Pure hypercholesterolemia, unspecified: Secondary | ICD-10-CM | POA: Diagnosis not present

## 2022-11-28 MED ORDER — SIMVASTATIN 10 MG PO TABS: ORAL_TABLET | ORAL | 3 refills | Status: DC

## 2022-11-28 MED ORDER — ALPRAZOLAM 0.25 MG PO TABS: 0.2500 mg | ORAL_TABLET | Freq: Every day | ORAL | 0 refills | Status: DC | PRN

## 2022-11-28 MED ORDER — METFORMIN HCL 500 MG PO TABS: 500.0000 mg | ORAL_TABLET | Freq: Every day | ORAL | 1 refills | Status: DC

## 2022-11-28 MED ORDER — PANTOPRAZOLE SODIUM 40 MG PO TBEC: 40.0000 mg | DELAYED_RELEASE_TABLET | Freq: Two times a day (BID) | ORAL | 3 refills | Status: DC

## 2022-11-28 MED ORDER — ONETOUCH DELICA LANCETS 30G MISC: 3 refills | Status: AC

## 2022-11-28 MED ORDER — SERTRALINE HCL 100 MG PO TABS: ORAL_TABLET | ORAL | 1 refills | Status: DC

## 2022-11-28 MED ORDER — AZELASTINE HCL 137 MCG/SPRAY NA SOLN: NASAL | 2 refills | Status: DC

## 2022-11-28 MED ORDER — LOSARTAN POTASSIUM-HCTZ 50-12.5 MG PO TABS: 1.0000 | ORAL_TABLET | Freq: Every day | ORAL | 2 refills | Status: DC

## 2022-11-28 NOTE — Progress Notes (Unsigned)
Subjective:    Patient ID: Erin Camacho, female    DOB: 09/14/1946, 77 y.o.   MRN: [id number]  Patient here for  Chief Complaint  Patient presents with   Medical Management of Chronic Issues   Diabetes   Hypertension    HPI Here to follow up regarding her cholesterol, blood pressure and blood sugar.  Has cut out soft drinks.  Discussed labs.  A1c 6.1. Has back issues - back pain. Sees Dr Ellene Route - injection. Last helped.  Currently pain is better. Has known DDD.  Continues on zoloft.  Increased stress - husband's medical issues.  Request refill xanax.  Rarely uses. Does not feel needs any further intervention at this time.  Saw Dr Rockey Situ 10/30/22 - stable.  Recommended regular walking program. She did recently aggravate her knee/leg. Getting out of car - heard a pop.  Is scheduled to see Dr Harlow Mares next week.  Also reports some increased left groin pain with certain movements/palpation.  Plans to discuss with ortho.     Past Medical History:  Diagnosis Date   Allergy    hay fever   Anxiety    Arthritis    Chronic back pain    Diabetes mellitus    Dyspnea    a. 07/2017 Echo: EF 60-65%. No rwma. Mildly dil LA. Nl RV fxn.   GERD (gastroesophageal reflux disease)    History of hiatal hernia    Hyperlipidemia    Hypertension    Osteoarthritis    Pneumonia    Spinal stenosis    Type 2 diabetes mellitus ([place])    UTI (lower urinary tract infection)    Past Surgical History:  Procedure Laterality Date   ABDOMINAL HYSTERECTOMY  1973   ABDOMINAL HYSTERECTOMY     APPENDECTOMY     APPLICATION OF ROBOTIC ASSISTANCE FOR SPINAL PROCEDURE N/A 09/15/2018   Procedure: APPLICATION OF ROBOTIC ASSISTANCE FOR SPINAL PROCEDURE;  Surgeon: Kristeen Miss, MD;  Location: [place];  Service: Neurosurgery;  Laterality: N/A;   BACK SURGERY  04/17/13, 2017, 2019   CATARACT EXTRACTION W/PHACO Left 12/16/2019   Procedure: CATARACT EXTRACTION PHACO AND INTRAOCULAR LENS PLACEMENT (IOC) LEFT PANOPTIX LENS  DIABETIC 8.07 01:19.2 10.2%;  Surgeon: Leandrew Koyanagi, MD;  Location: [place];  Service: Ophthalmology;  Laterality: Left;  Diabetic - oral meds   CATARACT EXTRACTION W/PHACO Right 01/06/2020   Procedure: CATARACT EXTRACTION PHACO AND INTRAOCULAR LENS PLACEMENT (IOC) PANOPTIX LENS RIGHT DIABETIC 10.73 01:08.6 15.7%;  Surgeon: Leandrew Koyanagi, MD;  Location: [place];  Service: Ophthalmology;  Laterality: Right;  diabetic   CHOLECYSTECTOMY  1995   COLONOSCOPY WITH PROPOFOL N/A 03/15/2021   Procedure: COLONOSCOPY WITH PROPOFOL;  Surgeon: Robert Bellow, MD;  Location: [facility];  Service: Endoscopy;  Laterality: N/A;   ESOPHAGOGASTRODUODENOSCOPY N/A 06/20/2022   Procedure: ESOPHAGOGASTRODUODENOSCOPY (EGD);  Surgeon: Tol[place], Benay Pike, MD;  Location: [facility];  Service: Gastroenterology;  Laterality: N/A;   EYE SURGERY  2021   bilateral cataract removal with lens implants   HERNIA REPAIR  08/2006   lap band  08/2003   LUMBAR LAMINECTOMY/ DECOMPRESSION WITH MET-RX Left 12/29/2020   Procedure: Left Thoracic Seven-Eight Microdiscectomy;  Surgeon: Kristeen Miss, MD;  Location: [place];  Service: Neurosurgery;  Laterality: Left;   SEPTOPLASTY  04/1978   TONSILLECTOMY AND ADENOIDECTOMY  1979   TOTAL HIP ARTHROPLASTY  02/2012   Right, Dr. Marry Guan   TOTAL HIP ARTHROPLASTY Left 08/31/2019   Procedure: TOTAL HIP ARTHROPLASTY;  Surgeon: Skip Estimable  P, MD;  Location: [facility];  Service: Orthopedics;  Laterality: Left;   UPPER GI ENDOSCOPY  06/20/2022   Family History  Problem Relation Age of Onset   Hypertension Mother    Stroke Mother    Heart disease Mother    Hypertension Father    Stroke Father    Heart disease Father    Arthritis Sister    Diabetes Sister    Cancer Sister    Diabetes Brother    Hypertension Brother    Dementia Brother    Diabetes Brother    Hypertension Brother    Dementia Brother    Breast cancer Other 71   Cancer Other         breast   Diabetes Other    Kidney disease Neg Hx    Bladder Cancer Neg Hx    Social History   Socioeconomic History   Marital status: Married    Spouse name: Not on file   Number of children: Not on file   Years of education: Not on file   Highest education level: Not on file  Occupational History   Not on file  Tobacco Use   Smoking status: Never   Smokeless tobacco: Never  Vaping Use   Vaping Use: Never used  Substance and Sexual Activity   Alcohol use: Yes    Alcohol/week: 0.0 - 1.0 standard drinks of alcohol    Comment: RARELY   Drug use: No   Sexual activity: Not Currently  Other Topics Concern   Not on file  Social History Narrative   Lives in [place] with husband.      Work - retired Sports administrator   Social Determinants of Radio broadcast assistant Strain: [place]  (03/09/2022)   Overall Financial Resource Strain (CARDIA)    Difficulty of Paying Living Expenses: Not hard at all  Food Insecurity: No Food Insecurity (03/09/2022)   Hunger Vital Sign    Worried About Running Out of Food in the Last Year: Never true    [place] in the Last Year: Never true  Transportation Needs: No Transportation Needs (03/09/2022)   PRAPARE - Hydrologist (Medical): No    Lack of Transportation (Non-Medical): No  Physical Activity: Unknown (03/08/2021)   Exercise Vital Sign    Days of Exercise per Week: 0 days    Minutes of Exercise per Session: Not on file  Stress: No Stress Concern Present (03/09/2022)   [place]    Feeling of Stress : Not at all  Social Connections: Unknown (03/09/2022)   Social Connection and Isolation Panel [NHANES]    Frequency of Communication with Friends and Family: Not on file    Frequency of Social Gatherings with Friends and Family: Not on file    Attends Religious Services: Not on file    Active Member of Clubs or Organizations: Not on  file    Attends Archivist Meetings: Not on file    Marital Status: Married     Review of Systems  Constitutional:  Negative for appetite change and unexpected weight change.  HENT:  Negative for congestion and sinus pressure.   Respiratory:  Negative for cough, chest tightness and shortness of breath.   Cardiovascular:  Negative for chest pain and palpitations.       No increased swelling.   Gastrointestinal:  Negative for abdominal pain, diarrhea, nausea and vomiting.  Genitourinary:  Negative for difficulty urinating and dysuria.  Musculoskeletal:  Positive for back pain.       Knee and groin pain as outlined.    Skin:  Negative for color change and rash.  Neurological:  Negative for dizziness and headaches.  Psychiatric/Behavioral:  Negative for agitation and dysphoric mood.        Increased stress as outlined.        Objective:     BP 126/76 (BP Location: Left Arm, Patient Position: Sitting, Cuff Size: Large)   Pulse (!) 58   Temp 98.1 F (36.7 C) (Temporal)   Resp 15   Ht '5\' 5"'$  (1.651 m)   Wt 195 lb 3.2 oz (88.5 kg)   SpO2 99%   BMI 32.48 kg/m  Wt Readings from Last 3 Encounters:  11/28/22 195 lb 3.2 oz (88.5 kg)  10/30/22 197 lb 8 oz (89.6 kg)  08/17/22 193 lb 14.4 oz (88 kg)    Physical Exam Vitals reviewed.  Constitutional:      General: She is not in acute distress.    Appearance: Normal appearance.  HENT:     Head: Normocephalic and atraumatic.     Right Ear: External ear normal.     Left Ear: External ear normal.  Eyes:     General: No scleral icterus.       Right eye: No discharge.        Left eye: No discharge.     Conjunctiva/sclera: Conjunctivae normal.  Neck:     Thyroid: No thyromegaly.  Cardiovascular:     Rate and Rhythm: Normal rate and regular rhythm.  Pulmonary:     Effort: No respiratory distress.     Breath sounds: Normal breath sounds. No wheezing.  Abdominal:     General: Bowel sounds are normal.     Palpations:  Abdomen is soft.     Tenderness: There is no abdominal tenderness.  Musculoskeletal:        General: No swelling or tenderness.     Cervical back: Neck supple. No tenderness.     Comments: Increased pain - palpation just below left knee.  Increased pain in left groin - with palpation and with abduction of lower extremity.    Lymphadenopathy:     Cervical: No cervical adenopathy.  Skin:    Findings: No erythema or rash.  Neurological:     Mental Status: She is alert.  Psychiatric:        Mood and Affect: Mood normal.        Behavior: Behavior normal.      Outpatient Encounter Medications as of 11/28/2022  Medication Sig   cholecalciferol (VITAMIN D) 25 MCG (1000 UNIT) tablet Take 1,000 Units by mouth daily.   Ferrous Sulfate (IRON) 325 (65 Fe) MG TABS Take 325 mg by mouth daily.   fexofenadine (ALLEGRA) 180 MG tablet Take 1 tablet (180 mg total) by mouth daily.   fluticasone (FLONASE) 50 MCG/ACT nasal spray SPRAY 2 SPRAYS INTO EACH NOSTRIL EVERY DAY   gabapentin (NEURONTIN) 300 MG capsule Take 300 mg by mouth 2 (two) times daily.   lidocaine (LIDODERM) 5 % SMARTSIG:2 Patch(s) T-DERMAL Daily   Multiple Vitamin (MULTIVITAMIN WITH MINERALS) TABS tablet Take 1 tablet by mouth daily.   ONETOUCH VERIO test strip USE TO CHECK BLOOD SUGAR TWICE A DAY   traMADol (ULTRAM) 50 MG tablet Take 1-2 tablets as needed for severe pain. Not to exceed two doses (4 tablets, 200 mg) daily   traZODone (DESYREL) 100  MG tablet TAKE 1 TABLET BY MOUTH EVERYDAY AT BEDTIME   [DISCONTINUED] ALPRAZolam (XANAX) 0.25 MG tablet Take 1 tablet (0.25 mg total) by mouth daily as needed for anxiety.   [DISCONTINUED] Azelastine HCl 137 MCG/SPRAY SOLN PLACE 1 SPRAY INTO BOTH NOSTRILS 2 (TWO) TIMES DAILY. USE IN EACH NOSTRIL AS DIRECTED   [DISCONTINUED] losartan-hydrochlorothiazide (HYZAAR) 50-12.5 MG tablet Take 1 tablet by mouth daily.   [DISCONTINUED] metFORMIN (GLUCOPHAGE) 500 MG tablet TAKE 1 TABLET BY MOUTH EVERY DAY    [DISCONTINUED] OneTouch Delica Lancets 91Y MISC Use to check blood sugar x per day. DX E11.9.   [DISCONTINUED] pantoprazole (PROTONIX) 40 MG tablet TAKE 1 TABLET (40 MG TOTAL) BY MOUTH 2 (TWO) TIMES DAILY BEFORE A MEAL.   [DISCONTINUED] sertraline (ZOLOFT) 100 MG tablet TAKE 1/2 TABLET BY MOUTH (50 MG) BY MOUTH DAILY   [DISCONTINUED] simvastatin (ZOCOR) 10 MG tablet TAKE 1/2 TABLET (5 MG TOTAL) BY MOUTH AT BEDTIME.   ALPRAZolam (XANAX) 0.25 MG tablet Take 1 tablet (0.25 mg total) by mouth daily as needed for anxiety.   Azelastine HCl 137 MCG/SPRAY SOLN PLACE 1 SPRAY INTO BOTH NOSTRILS 2 (TWO) TIMES DAILY. USE IN EACH NOSTRIL AS DIRECTED   losartan-hydrochlorothiazide (HYZAAR) 50-12.5 MG tablet Take 1 tablet by mouth daily.   metFORMIN (GLUCOPHAGE) 500 MG tablet Take 1 tablet (500 mg total) by mouth daily.   OneTouch Delica Lancets 78G MISC Use to check blood sugar x per day. DX E11.9.   pantoprazole (PROTONIX) 40 MG tablet Take 1 tablet (40 mg total) by mouth 2 (two) times daily before a meal.   sertraline (ZOLOFT) 100 MG tablet TAKE 1/2 TABLET BY MOUTH (50 MG) BY MOUTH DAILY   simvastatin (ZOCOR) 10 MG tablet TAKE 1/2 TABLET (5 MG TOTAL) BY MOUTH AT BEDTIME.   [DISCONTINUED] baclofen (LIORESAL) 10 MG tablet Take 10 mg by mouth 3 (three) times daily. (Patient not taking: Reported on 10/30/2022)   [DISCONTINUED] ketorolac (TORADOL) 10 MG tablet Take 1 tablet (10 mg total) by mouth every 8 (eight) hours as needed. (Patient not taking: Reported on 10/30/2022)   No facility-administered encounter medications on file as of 11/28/2022.     Lab Results  Component Value Date   WBC 5.5 07/30/2022   HGB 12.4 07/30/2022   HCT 39.2 07/30/2022   PLT 174 07/30/2022   GLUCOSE 104 (H) 11/27/2022   CHOL 178 11/27/2022   TRIG 66.0 11/27/2022   HDL 86.00 11/27/2022   LDLDIRECT 146.5 09/19/2012   LDLCALC 79 11/27/2022   ALT 12 11/27/2022   AST 16 11/27/2022   NA 142 11/27/2022   K 4.0 11/27/2022   CL  103 11/27/2022   CREATININE 0.88 11/27/2022   BUN 19 11/27/2022   CO2 32 11/27/2022   TSH 2.94 08/17/2022   INR 1.0 08/21/2019   HGBA1C 6.1 11/27/2022   MICROALBUR 1.2 05/17/2022    MM 3D SCREEN BREAST BILATERAL  Result Date: 11/15/2022 CLINICAL DATA:  Screening. EXAM: DIGITAL SCREENING BILATERAL MAMMOGRAM WITH TOMOSYNTHESIS AND CAD TECHNIQUE: Bilateral screening digital craniocaudal and mediolateral oblique mammograms were obtained. Bilateral screening digital breast tomosynthesis was performed. The images were evaluated with computer-aided detection. COMPARISON:  Previous exam(s). ACR Breast Density Category a: The breast tissue is almost entirely fatty. FINDINGS: There are no findings suspicious for malignancy. IMPRESSION: No mammographic evidence of malignancy. A result letter of this screening mammogram will be mailed directly to the patient. RECOMMENDATION: Screening mammogram in one year. (Code:[id number]) BI-RADS CATEGORY  1: Negative. Electronically Signed  By: Lajean Manes M.D.   On: 11/15/2022 11:30       Assessment & Plan:  Primary hypertension -     Basic metabolic panel; Future  Diabetes mellitus type 2, diet-controlled ([place]) Assessment & Plan: Low carb diet and exercise.  Follow met b and a1c.   Follow.  Stay hydrated.   Lab Results  Component Value Date   HGBA1C 6.1 11/27/2022    Orders: -     Hemoglobin A1c; Future  Hypercholesterolemia Assessment & Plan: Continue simvastatin.  Low cholesterol diet and exercise.  Follow lipid panel and liver function tests.    Orders: -     Lipid panel; Future -     Hepatic function panel; Future -     TSH; Future  Aortic atherosclerosis ([facility]) Assessment & Plan: Continue simvastatin.    Chronic low back pain (1ry area of Pain) (Bilateral) (R>L) w/o sciatica Assessment & Plan: Followed by Dr Ellene Route.  With last injection, pain better.  Follow.    Depression, major, single episode, mild ([place]) Assessment &  Plan: Continue on zoloft.  Increased stress as outlined.  Discussed.  Request refill xanax.  Rarely uses.  Does not feel needs any further intervention. Follow.     Essential (primary) hypertension Assessment & Plan: Blood pressure as outlined.  Continue losartan/hctz.  Follow pressures.  Follow metabolic panel.    Gastroesophageal reflux disease, unspecified whether esophagitis present Assessment & Plan: No acid reflux reported.  On protonix.    Pain of left lower extremity Assessment & Plan: Knee/leg pain as outlined.  Noticed a popping sensation when getting out of the car.  Scheduled to see Dr Harlow Mares next week.  Also with left groin pain. Plans to discuss with him as well.  Follow.    Other orders -     Losartan Potassium-HCTZ; Take 1 tablet by mouth daily.  Dispense: 90 tablet; Refill: 2 -     metFORMIN HCl; Take 1 tablet (500 mg total) by mouth daily.  Dispense: 90 tablet; Refill: 1 -     Pantoprazole Sodium; Take 1 tablet (40 mg total) by mouth 2 (two) times daily before a meal.  Dispense: 180 tablet; Refill: 3 -     Sertraline HCl; TAKE 1/2 TABLET BY MOUTH (50 MG) BY MOUTH DAILY  Dispense: 45 tablet; Refill: 1 -     Simvastatin; TAKE 1/2 TABLET (5 MG TOTAL) BY MOUTH AT BEDTIME.  Dispense: 45 tablet; Refill: 3 -     ALPRAZolam; Take 1 tablet (0.25 mg total) by mouth daily as needed for anxiety.  Dispense: 30 tablet; Refill: 0 -     Azelastine HCl; PLACE 1 SPRAY INTO BOTH NOSTRILS 2 (TWO) TIMES DAILY. USE IN EACH NOSTRIL AS DIRECTED  Dispense: 30 mL; Refill: 2 -     OneTouch Delica Lancets 53M; Use to check blood sugar x per day. DX E11.9.  Dispense: 100 each; Refill: 3     [name], MD

## 2022-11-29 ENCOUNTER — Encounter: Payer: Self-pay | Admitting: Internal Medicine

## 2022-11-29 DIAGNOSIS — M79606 Pain in leg, unspecified: Secondary | ICD-10-CM | POA: Insufficient documentation

## 2022-11-29 NOTE — Assessment & Plan Note (Signed)
Blood pressure as outlined.  Continue losartan/hctz.  Follow pressures.  Follow metabolic panel.

## 2022-11-29 NOTE — Assessment & Plan Note (Signed)
Followed by Dr Ellene Route.  With last injection, pain better.  Follow.

## 2022-11-29 NOTE — Assessment & Plan Note (Signed)
Low carb diet and exercise.  Follow met b and a1c.   Follow.  Stay hydrated.   Lab Results  Component Value Date   HGBA1C 6.1 11/27/2022

## 2022-11-29 NOTE — Assessment & Plan Note (Signed)
Continue on zoloft.  Increased stress as outlined.  Discussed.  Request refill xanax.  Rarely uses.  Does not feel needs any further intervention. Follow.

## 2022-11-29 NOTE — Assessment & Plan Note (Signed)
Continue simvastatin.  Low cholesterol diet and exercise.  Follow lipid panel and liver function tests.   

## 2022-11-29 NOTE — Assessment & Plan Note (Signed)
Knee/leg pain as outlined.  Noticed a popping sensation when getting out of the car.  Scheduled to see Dr Harlow Mares next week.  Also with left groin pain. Plans to discuss with him as well.  Follow.

## 2022-11-29 NOTE — Assessment & Plan Note (Signed)
Continue simvastatin. 

## 2022-11-29 NOTE — Assessment & Plan Note (Signed)
No acid reflux reported.  On protonix.

## 2022-11-30 DIAGNOSIS — Z008 Encounter for other general examination: Secondary | ICD-10-CM | POA: Diagnosis not present

## 2022-11-30 DIAGNOSIS — R269 Unspecified abnormalities of gait and mobility: Secondary | ICD-10-CM | POA: Diagnosis not present

## 2022-11-30 DIAGNOSIS — E669 Obesity, unspecified: Secondary | ICD-10-CM | POA: Diagnosis not present

## 2022-11-30 DIAGNOSIS — I10 Essential (primary) hypertension: Secondary | ICD-10-CM | POA: Diagnosis not present

## 2022-11-30 DIAGNOSIS — R69 Illness, unspecified: Secondary | ICD-10-CM | POA: Diagnosis not present

## 2022-11-30 DIAGNOSIS — M1990 Unspecified osteoarthritis, unspecified site: Secondary | ICD-10-CM | POA: Diagnosis not present

## 2022-11-30 DIAGNOSIS — K219 Gastro-esophageal reflux disease without esophagitis: Secondary | ICD-10-CM | POA: Diagnosis not present

## 2022-11-30 DIAGNOSIS — E785 Hyperlipidemia, unspecified: Secondary | ICD-10-CM | POA: Diagnosis not present

## 2022-11-30 DIAGNOSIS — I700 Atherosclerosis of aorta: Secondary | ICD-10-CM | POA: Diagnosis not present

## 2022-11-30 DIAGNOSIS — E1140 Type 2 diabetes mellitus with diabetic neuropathy, unspecified: Secondary | ICD-10-CM | POA: Diagnosis not present

## 2022-11-30 DIAGNOSIS — Z7984 Long term (current) use of oral hypoglycemic drugs: Secondary | ICD-10-CM | POA: Diagnosis not present

## 2022-11-30 DIAGNOSIS — J301 Allergic rhinitis due to pollen: Secondary | ICD-10-CM | POA: Diagnosis not present

## 2022-12-03 DIAGNOSIS — E119 Type 2 diabetes mellitus without complications: Secondary | ICD-10-CM | POA: Diagnosis not present

## 2022-12-03 DIAGNOSIS — M1712 Unilateral primary osteoarthritis, left knee: Secondary | ICD-10-CM | POA: Diagnosis not present

## 2022-12-03 DIAGNOSIS — Z961 Presence of intraocular lens: Secondary | ICD-10-CM | POA: Diagnosis not present

## 2022-12-03 DIAGNOSIS — H26493 Other secondary cataract, bilateral: Secondary | ICD-10-CM | POA: Diagnosis not present

## 2022-12-03 LAB — HM DIABETES EYE EXAM

## 2022-12-04 DIAGNOSIS — J301 Allergic rhinitis due to pollen: Secondary | ICD-10-CM | POA: Diagnosis not present

## 2022-12-11 DIAGNOSIS — J301 Allergic rhinitis due to pollen: Secondary | ICD-10-CM | POA: Diagnosis not present

## 2022-12-12 ENCOUNTER — Other Ambulatory Visit: Payer: Self-pay

## 2022-12-16 ENCOUNTER — Other Ambulatory Visit: Payer: Self-pay | Admitting: Internal Medicine

## 2022-12-18 DIAGNOSIS — J301 Allergic rhinitis due to pollen: Secondary | ICD-10-CM | POA: Diagnosis not present

## 2022-12-22 ENCOUNTER — Other Ambulatory Visit: Payer: Self-pay | Admitting: Internal Medicine

## 2023-01-01 DIAGNOSIS — J301 Allergic rhinitis due to pollen: Secondary | ICD-10-CM | POA: Diagnosis not present

## 2023-01-08 DIAGNOSIS — J301 Allergic rhinitis due to pollen: Secondary | ICD-10-CM | POA: Diagnosis not present

## 2023-01-11 DIAGNOSIS — J301 Allergic rhinitis due to pollen: Secondary | ICD-10-CM | POA: Diagnosis not present

## 2023-01-14 ENCOUNTER — Other Ambulatory Visit: Payer: Self-pay | Admitting: Internal Medicine

## 2023-01-15 DIAGNOSIS — J301 Allergic rhinitis due to pollen: Secondary | ICD-10-CM | POA: Diagnosis not present

## 2023-01-21 DIAGNOSIS — S8002XA Contusion of left knee, initial encounter: Secondary | ICD-10-CM | POA: Diagnosis not present

## 2023-01-21 DIAGNOSIS — M1712 Unilateral primary osteoarthritis, left knee: Secondary | ICD-10-CM | POA: Diagnosis not present

## 2023-01-22 DIAGNOSIS — J301 Allergic rhinitis due to pollen: Secondary | ICD-10-CM | POA: Diagnosis not present

## 2023-01-29 DIAGNOSIS — J301 Allergic rhinitis due to pollen: Secondary | ICD-10-CM | POA: Diagnosis not present

## 2023-01-31 ENCOUNTER — Other Ambulatory Visit: Payer: Self-pay | Admitting: Internal Medicine

## 2023-01-31 DIAGNOSIS — Z8719 Personal history of other diseases of the digestive system: Secondary | ICD-10-CM | POA: Diagnosis not present

## 2023-01-31 DIAGNOSIS — R1319 Other dysphagia: Secondary | ICD-10-CM

## 2023-01-31 DIAGNOSIS — Z9884 Bariatric surgery status: Secondary | ICD-10-CM

## 2023-01-31 DIAGNOSIS — K219 Gastro-esophageal reflux disease without esophagitis: Secondary | ICD-10-CM | POA: Diagnosis not present

## 2023-01-31 DIAGNOSIS — Z9889 Other specified postprocedural states: Secondary | ICD-10-CM | POA: Diagnosis not present

## 2023-01-31 DIAGNOSIS — E6609 Other obesity due to excess calories: Secondary | ICD-10-CM | POA: Diagnosis not present

## 2023-02-04 ENCOUNTER — Ambulatory Visit
Admission: RE | Admit: 2023-02-04 | Discharge: 2023-02-04 | Disposition: A | Discharge status: Home / Self Care | Payer: Medicare HMO | Source: Ambulatory Visit | Attending: Internal Medicine | Admitting: Internal Medicine

## 2023-02-04 ENCOUNTER — Other Ambulatory Visit: Payer: Self-pay | Admitting: Internal Medicine

## 2023-02-04 DIAGNOSIS — K224 Dyskinesia of esophagus: Secondary | ICD-10-CM | POA: Diagnosis not present

## 2023-02-04 DIAGNOSIS — Z9884 Bariatric surgery status: Secondary | ICD-10-CM | POA: Diagnosis not present

## 2023-02-04 DIAGNOSIS — Z8719 Personal history of other diseases of the digestive system: Secondary | ICD-10-CM

## 2023-02-04 DIAGNOSIS — Z9889 Other specified postprocedural states: Secondary | ICD-10-CM | POA: Insufficient documentation

## 2023-02-04 DIAGNOSIS — R1319 Other dysphagia: Secondary | ICD-10-CM | POA: Insufficient documentation

## 2023-02-04 DIAGNOSIS — R1310 Dysphagia, unspecified: Secondary | ICD-10-CM | POA: Diagnosis not present

## 2023-02-07 ENCOUNTER — Encounter: Payer: Self-pay | Admitting: Internal Medicine

## 2023-02-08 NOTE — Telephone Encounter (Signed)
Please call and thank her for the update.  Keep [place] posted on how he is doing.

## 2023-02-08 NOTE — Telephone Encounter (Signed)
Pt aware.

## 2023-02-12 DIAGNOSIS — J301 Allergic rhinitis due to pollen: Secondary | ICD-10-CM | POA: Diagnosis not present

## 2023-02-15 DIAGNOSIS — R1319 Other dysphagia: Secondary | ICD-10-CM | POA: Diagnosis not present

## 2023-02-15 DIAGNOSIS — Z4651 Encounter for fitting and adjustment of gastric lap band: Secondary | ICD-10-CM | POA: Diagnosis not present

## 2023-02-15 DIAGNOSIS — K9509 Other complications of gastric band procedure: Secondary | ICD-10-CM | POA: Diagnosis not present

## 2023-02-15 DIAGNOSIS — R1110 Vomiting, unspecified: Secondary | ICD-10-CM | POA: Diagnosis not present

## 2023-02-15 DIAGNOSIS — K219 Gastro-esophageal reflux disease without esophagitis: Secondary | ICD-10-CM | POA: Diagnosis not present

## 2023-02-15 DIAGNOSIS — Z9884 Bariatric surgery status: Secondary | ICD-10-CM | POA: Diagnosis not present

## 2023-02-15 DIAGNOSIS — E669 Obesity, unspecified: Secondary | ICD-10-CM | POA: Diagnosis not present

## 2023-02-19 DIAGNOSIS — J301 Allergic rhinitis due to pollen: Secondary | ICD-10-CM | POA: Diagnosis not present

## 2023-02-26 DIAGNOSIS — J301 Allergic rhinitis due to pollen: Secondary | ICD-10-CM | POA: Diagnosis not present

## 2023-03-05 DIAGNOSIS — J301 Allergic rhinitis due to pollen: Secondary | ICD-10-CM | POA: Diagnosis not present

## 2023-03-11 ENCOUNTER — Telehealth: Payer: Self-pay | Admitting: Internal Medicine

## 2023-03-11 NOTE — Telephone Encounter (Signed)
Called patient to schedule Medicare Annual Wellness Visit (AWV). Left message for patient to call back and schedule Medicare Annual Wellness Visit (AWV).  Last date of AWV: 03/09/2022   Please schedule an AWVS appointment at any time with [facility] ANNUAL WELLNESS VISIT.  If any questions, please contact me at [phone].    Thank you,  Sherral Dirocco  [facility] Support [facility] Direct dial  [phone]   

## 2023-03-12 ENCOUNTER — Telehealth: Payer: Self-pay | Admitting: Internal Medicine

## 2023-03-12 DIAGNOSIS — J301 Allergic rhinitis due to pollen: Secondary | ICD-10-CM | POA: Diagnosis not present

## 2023-03-12 NOTE — Telephone Encounter (Signed)
Copied from CRM [id number]. Topic: Medicare AWV >> Mar 12, 2023  9:28 AM Rushie Goltz wrote: Reason for CRM: Called patient to schedule Medicare Annual Wellness Visit (AWV). Patient returned my call from yesterday, but I missed the call. Left message for patient to call back and schedule Medicare Annual Wellness Visit (AWV).  Last date of AWV: 03/09/2022  Please schedule an AWVS appointment at any time with [facility] VISIT.  If any questions, please contact me at [phone].    Thank you,  [facility] Support [facility] Direct dial  [phone]

## 2023-03-12 NOTE — Telephone Encounter (Signed)
Contacted Zenia Resides to schedule their annual wellness visit. Appointment made for 03/18/2023.  Thank you,  [facility] Support [facility] Direct dial  [phone]

## 2023-03-14 DIAGNOSIS — M65342 Trigger finger, left ring finger: Secondary | ICD-10-CM | POA: Diagnosis not present

## 2023-03-18 ENCOUNTER — Ambulatory Visit (INDEPENDENT_AMBULATORY_CARE_PROVIDER_SITE_OTHER): Payer: Medicare HMO

## 2023-03-18 VITALS — Ht 65.0 in | Wt 195.0 lb

## 2023-03-18 DIAGNOSIS — Z0000 Encounter for general adult medical examination without abnormal findings: Secondary | ICD-10-CM

## 2023-03-18 NOTE — Patient Instructions (Addendum)
Erin Camacho , Thank you for taking time to come for your Medicare Wellness Visit. I appreciate your ongoing commitment to your health goals. Please review the following plan we discussed and let me know if I can assist you in the future.   These are the goals we discussed:  Goals       Patient Stated     Increase physical activity (pt-stated)      Walk more for exercise.  Stretch! Lose weight.        Other     Care coordination activities      Care Coordination Interventions: Phone call to patient to discuss/offer case management services-spoke with patient's spouse(DPR)  SDOH screen completed Per patient's spouse, he would like to speak with patient's provider during appointment on 08/17/22 to discuss need involvement in this program Patient's spouse encouraged to schedule patient's AWV-benefits discussed        This is a list of the screening recommended for you and due dates:  Health Maintenance  Topic Date Due   Complete foot exam   09/15/2021   COVID-19 Vaccine (8 - 2023-24 season) 04/03/2023*   Yearly kidney health urinalysis for diabetes  05/18/2023   Hemoglobin A1C  05/28/2023   Flu Shot  06/20/2023   Mammogram  11/15/2023   Yearly kidney function blood test for diabetes  11/28/2023   Eye exam for diabetics  12/04/2023   Medicare Annual Wellness Visit  03/17/2024   DTaP/Tdap/Td vaccine (3 - Td or Tdap) 04/19/2029   Pneumonia Vaccine  Completed   DEXA scan (bone density measurement)  Completed   Hepatitis C Screening: USPSTF Recommendation to screen - Ages 21-79 yo.  Completed   Zoster (Shingles) Vaccine  Completed   HPV Vaccine  Aged Out   Colon Cancer Screening  Discontinued  *Topic was postponed. The date shown is not the original due date.    Advanced directives: on file  Conditions/risks identified: none new  Next appointment: Follow up in one year for your annual wellness visit    Preventive Care 65 Years and Older, Female Preventive care refers to  lifestyle choices and visits with your health care provider that can promote health and wellness. What does preventive care include? A yearly physical exam. This is also called an annual well check. Dental exams once or twice a year. Routine eye exams. Ask your health care provider how often you should have your eyes checked. Personal lifestyle choices, including: Daily care of your teeth and gums. Regular physical activity. Eating a healthy diet. Avoiding tobacco and drug use. Limiting alcohol use. Practicing safe sex. Taking low-dose aspirin every day. Taking vitamin and mineral supplements as recommended by your health care provider. What happens during an annual well check? The services and screenings done by your health care provider during your annual well check will depend on your age, overall health, lifestyle risk factors, and family history of disease. Counseling  Your health care provider may ask you questions about your: Alcohol use. Tobacco use. Drug use. Emotional well-being. Home and relationship well-being. Sexual activity. Eating habits. History of falls. Memory and ability to understand (cognition). Work and work Astronomer. Reproductive health. Screening  You may have the following tests or measurements: Height, weight, and BMI. Blood pressure. Lipid and cholesterol levels. These may be checked every 5 years, or more frequently if you are over 38 years old. Skin check. Lung cancer screening. You may have this screening every year starting at age 90 if you have  a 30-pack-year history of smoking and currently smoke or have quit within the past 15 years. Fecal occult blood test (FOBT) of the stool. You may have this test every year starting at age 85. Flexible sigmoidoscopy or colonoscopy. You may have a sigmoidoscopy every 5 years or a colonoscopy every 10 years starting at age 47. Hepatitis C blood test. Hepatitis B blood test. Sexually transmitted disease  (STD) testing. Diabetes screening. This is done by checking your blood sugar (glucose) after you have not eaten for a while (fasting). You may have this done every 1-3 years. Bone density scan. This is done to screen for osteoporosis. You may have this done starting at age 22. Mammogram. This may be done every 1-2 years. Talk to your health care provider about how often you should have regular mammograms. Talk with your health care provider about your test results, treatment options, and if necessary, the need for more tests. Vaccines  Your health care provider may recommend certain vaccines, such as: Influenza vaccine. This is recommended every year. Tetanus, diphtheria, and acellular pertussis (Tdap, Td) vaccine. You may need a Td booster every 10 years. Zoster vaccine. You may need this after age 75. Pneumococcal 13-valent conjugate (PCV13) vaccine. One dose is recommended after age 69. Pneumococcal polysaccharide (PPSV23) vaccine. One dose is recommended after age 52. Talk to your health care provider about which screenings and vaccines you need and how often you need them. This information is not intended to replace advice given to you by your health care provider. Make sure you discuss any questions you have with your health care provider. Document Released: 12/02/2015 Document Revised: 07/25/2016 Document Reviewed: 09/06/2015 Elsevier Interactive Patient Education  2017 [employer].  Fall Prevention in the Home Falls can cause injuries. They can happen to people of all ages. There are many things you can do to make your home safe and to help prevent falls. What can I do on the outside of my home? Regularly fix the edges of walkways and driveways and fix any cracks. Remove anything that might make you trip as you walk through a door, such as a raised step or threshold. Trim any bushes or trees on the path to your home. Use bright outdoor lighting. Clear any walking paths of anything  that might make someone trip, such as rocks or tools. Regularly check to see if handrails are loose or broken. Make sure that both sides of any steps have handrails. Any raised decks and porches should have guardrails on the edges. Have any leaves, snow, or ice cleared regularly. Use sand or salt on walking paths during winter. Clean up any spills in your garage right away. This includes oil or grease spills. What can I do in the bathroom? Use night lights. Install grab bars by the toilet and in the tub and shower. Do not use towel bars as grab bars. Use non-skid mats or decals in the tub or shower. If you need to sit down in the shower, use a plastic, non-slip stool. Keep the floor dry. Clean up any water that spills on the floor as soon as it happens. Remove soap buildup in the tub or shower regularly. Attach bath mats securely with double-sided non-slip rug tape. Do not have throw rugs and other things on the floor that can make you trip. What can I do in the bedroom? Use night lights. Make sure that you have a light by your bed that is easy to reach. Do not use any  sheets or blankets that are too big for your bed. They should not hang down onto the floor. Have a firm chair that has side arms. You can use this for support while you get dressed. Do not have throw rugs and other things on the floor that can make you trip. What can I do in the kitchen? Clean up any spills right away. Avoid walking on wet floors. Keep items that you use a lot in easy-to-reach places. If you need to reach something above you, use a strong step stool that has a grab bar. Keep electrical cords out of the way. Do not use floor polish or wax that makes floors slippery. If you must use wax, use non-skid floor wax. Do not have throw rugs and other things on the floor that can make you trip. What can I do with my stairs? Do not leave any items on the stairs. Make sure that there are handrails on both sides of  the stairs and use them. Fix handrails that are broken or loose. Make sure that handrails are as long as the stairways. Check any carpeting to make sure that it is firmly attached to the stairs. Fix any carpet that is loose or worn. Avoid having throw rugs at the top or bottom of the stairs. If you do have throw rugs, attach them to the floor with carpet tape. Make sure that you have a light switch at the top of the stairs and the bottom of the stairs. If you do not have them, ask someone to add them for you. What else can I do to help prevent falls? Wear shoes that: Do not have high heels. Have rubber bottoms. Are comfortable and fit you well. Are closed at the toe. Do not wear sandals. If you use a stepladder: Make sure that it is fully opened. Do not climb a closed stepladder. Make sure that both sides of the stepladder are locked into place. Ask someone to hold it for you, if possible. Clearly mark and make sure that you can see: Any grab bars or handrails. First and last steps. Where the edge of each step is. Use tools that help you move around (mobility aids) if they are needed. These include: Canes. Walkers. Scooters. Crutches. Turn on the lights when you go into a dark area. Replace any light bulbs as soon as they burn out. Set up your furniture so you have a clear path. Avoid moving your furniture around. If any of your floors are uneven, fix them. If there are any pets around you, be aware of where they are. Review your medicines with your doctor. Some medicines can make you feel dizzy. This can increase your chance of falling. Ask your doctor what other things that you can do to help prevent falls. This information is not intended to replace advice given to you by your health care provider. Make sure you discuss any questions you have with your health care provider. Document Released: 09/01/2009 Document Revised: 04/12/2016 Document Reviewed: 12/10/2014 Elsevier Interactive  Patient Education  2017 [employer].

## 2023-03-18 NOTE — Progress Notes (Signed)
Subjective:   Erin Camacho is a 77 y.o. female who presents for Medicare Annual (Subsequent) preventive examination.  Review of Systems    No ROS.  Medicare Wellness Virtual Visit.  Visual/audio telehealth visit, UTA vital signs.   See social history for additional risk factors.   Cardiac Risk Factors include: advanced age (>32men, >37 women);diabetes mellitus;hypertension     Objective:    Today's Vitals   03/18/23 1235  Weight: 195 lb (88.5 kg)  Height: 5\' 5"  (1.651 m)   Body mass index is 32.45 kg/m.     03/18/2023   12:59 PM 07/30/2022   10:35 AM 06/20/2022    9:04 PM 06/20/2022    3:01 PM 03/09/2022    1:32 PM 07/06/2021    2:03 PM 03/15/2021    8:05 AM  Advanced Directives  Does Patient Have a Medical Advance Directive? Yes No Yes No Yes No No  Type of Advance Directive Living will;Healthcare Power of Attorney  Living will  Healthcare Power of [place];Living will    Does patient want to make changes to medical advance directive? No - Patient declined    No - Patient declined    Copy of Healthcare Power of Attorney in Chart? Yes - validated most recent copy scanned in chart (See row information)    Yes - validated most recent copy scanned in chart (See row information)  No - copy requested  Would patient like information on creating a medical advance directive?  No - Patient declined    No - Patient declined No - Patient declined    Current Medications (verified) Outpatient Encounter Medications as of 03/18/2023  Medication Sig   ALPRAZolam (XANAX) 0.25 MG tablet Take 1 tablet (0.25 mg total) by mouth daily as needed for anxiety.   Azelastine HCl 137 MCG/SPRAY SOLN PLACE 1 SPRAY INTO BOTH NOSTRILS 2 (TWO) TIMES DAILY. USE IN EACH NOSTRIL AS DIRECTED   cholecalciferol (VITAMIN D) 25 MCG (1000 UNIT) tablet Take 1,000 Units by mouth daily.   Ferrous Sulfate (IRON) 325 (65 Fe) MG TABS Take 325 mg by mouth daily.   fexofenadine (ALLEGRA) 180 MG tablet Take 1 tablet (180 mg  total) by mouth daily.   fluticasone (FLONASE) 50 MCG/ACT nasal spray SPRAY 2 SPRAYS INTO EACH NOSTRIL EVERY DAY   gabapentin (NEURONTIN) 300 MG capsule Take 300 mg by mouth 2 (two) times daily.   lidocaine (LIDODERM) 5 % SMARTSIG:2 Patch(s) T-DERMAL Daily   losartan-hydrochlorothiazide (HYZAAR) 50-12.5 MG tablet Take 1 tablet by mouth daily.   metFORMIN (GLUCOPHAGE) 500 MG tablet Take 1 tablet (500 mg total) by mouth daily.   Multiple Vitamin (MULTIVITAMIN WITH MINERALS) TABS tablet Take 1 tablet by mouth daily.   OneTouch Delica Lancets 30G MISC Use to check blood sugar x per day. DX E11.9.   ONETOUCH VERIO test strip USE TO CHECK BLOOD SUGAR TWICE A DAY   pantoprazole (PROTONIX) 40 MG tablet Take 1 tablet (40 mg total) by mouth 2 (two) times daily before a meal.   sertraline (ZOLOFT) 100 MG tablet TAKE 1/2 TABLET BY MOUTH (50 MG) BY MOUTH DAILY   simvastatin (ZOCOR) 10 MG tablet TAKE 1/2 TABLET (5 MG TOTAL) BY MOUTH AT BEDTIME.   traMADol (ULTRAM) 50 MG tablet Take 1-2 tablets as needed for severe pain. Not to exceed two doses (4 tablets, 200 mg) daily   traZODone (DESYREL) 100 MG tablet TAKE 1 TABLET BY MOUTH EVERYDAY AT BEDTIME   No facility-administered encounter medications on file as  of 03/18/2023.    Allergies (verified) Adhesive [tape]   History: Past Medical History:  Diagnosis Date   Allergy    hay fever   Anxiety    Arthritis    Chronic back pain    Diabetes mellitus    Dyspnea    a. 07/2017 Echo: EF 60-65%. No rwma. Mildly dil LA. Nl RV fxn.   GERD (gastroesophageal reflux disease)    History of hiatal hernia    Hyperlipidemia    Hypertension    Osteoarthritis    Pneumonia    Spinal stenosis    Type 2 diabetes mellitus (HCC)    UTI (lower urinary tract infection)    Past Surgical History:  Procedure Laterality Date   ABDOMINAL HYSTERECTOMY  1973   ABDOMINAL HYSTERECTOMY     APPENDECTOMY     APPLICATION OF ROBOTIC ASSISTANCE FOR SPINAL PROCEDURE N/A  09/15/2018   Procedure: APPLICATION OF ROBOTIC ASSISTANCE FOR SPINAL PROCEDURE;  Surgeon: Barnett Abu, MD;  Location: [facility] OR;  Service: Neurosurgery;  Laterality: N/A;   BACK SURGERY  04/17/13, 2017, 2019   CATARACT EXTRACTION W/PHACO Left 12/16/2019   Procedure: CATARACT EXTRACTION PHACO AND INTRAOCULAR LENS PLACEMENT (IOC) LEFT PANOPTIX LENS DIABETIC 8.07 01:19.2 10.2%;  Surgeon: Lockie Mola, MD;  Location: [facility];  Service: Ophthalmology;  Laterality: Left;  Diabetic - oral meds   CATARACT EXTRACTION W/PHACO Right 01/06/2020   Procedure: CATARACT EXTRACTION PHACO AND INTRAOCULAR LENS PLACEMENT (IOC) PANOPTIX LENS RIGHT DIABETIC 10.73 01:08.6 15.7%;  Surgeon: Lockie Mola, MD;  Location: [facility];  Service: Ophthalmology;  Laterality: Right;  diabetic   CHOLECYSTECTOMY  1995   COLONOSCOPY WITH PROPOFOL N/A 03/15/2021   Procedure: COLONOSCOPY WITH PROPOFOL;  Surgeon: Earline Mayotte, MD;  Location: [facility];  Service: Endoscopy;  Laterality: N/A;   ESOPHAGOGASTRODUODENOSCOPY N/A 06/20/2022   Procedure: ESOPHAGOGASTRODUODENOSCOPY (EGD);  Surgeon: Toledo, Boykin Nearing, MD;  Location: [facility];  Service: Gastroenterology;  Laterality: N/A;   EYE SURGERY  2021   bilateral cataract removal with lens implants   HERNIA REPAIR  08/2006   lap band  08/2003   LUMBAR LAMINECTOMY/ DECOMPRESSION WITH MET-RX Left 12/29/2020   Procedure: Left Thoracic Seven-Eight Microdiscectomy;  Surgeon: Barnett Abu, MD;  Location: [facility] OR;  Service: Neurosurgery;  Laterality: Left;   SEPTOPLASTY  04/1978   TONSILLECTOMY AND ADENOIDECTOMY  1979   TOTAL HIP ARTHROPLASTY  02/2012   Right, Dr. Ernest Pine   TOTAL HIP ARTHROPLASTY Left 08/31/2019   Procedure: TOTAL HIP ARTHROPLASTY;  Surgeon: Donato Heinz, MD;  Location: [facility] ORS;  Service: Orthopedics;  Laterality: Left;   UPPER GI ENDOSCOPY  06/20/2022   Family History  Problem Relation Age of Onset   Hypertension Mother     Stroke Mother    Heart disease Mother    Hypertension Father    Stroke Father    Heart disease Father    Arthritis Sister    Diabetes Sister    Cancer Sister    Diabetes Brother    Hypertension Brother    Dementia Brother    Diabetes Brother    Hypertension Brother    Dementia Brother    Breast cancer Other 30   Cancer Other        breast   Diabetes Other    Kidney disease Neg Hx    Bladder Cancer Neg Hx    Social History   Socioeconomic History   Marital status: Married    Spouse name: Not on file  Number of children: Not on file   Years of education: Not on file   Highest education level: Not on file  Occupational History   Not on file  Tobacco Use   Smoking status: Never   Smokeless tobacco: Never  Vaping Use   Vaping Use: Never used  Substance and Sexual Activity   Alcohol use: Yes    Alcohol/week: 0.0 - 1.0 standard drinks of alcohol    Comment: RARELY   Drug use: No   Sexual activity: Not Currently  Other Topics Concern   Not on file  Social History Narrative   Lives in [place] with husband.      Work - retired Surveyor, minerals   Social Determinants of Corporate investment banker Strain: Low Risk  (03/16/2023)   Overall Financial Resource Strain (CARDIA)    Difficulty of Paying Living Expenses: Not hard at all  Food Insecurity: No Food Insecurity (03/16/2023)   Hunger Vital Sign    Worried About Running Out of Food in the Last Year: Never true    Ran Out of Food in the Last Year: Never true  Transportation Needs: No Transportation Needs (03/16/2023)   PRAPARE - Administrator, Civil Service (Medical): No    Lack of Transportation (Non-Medical): No  Physical Activity: Inactive (03/16/2023)   Exercise Vital Sign    Days of Exercise per Week: 0 days    Minutes of Exercise per Session: 0 min  Stress: Stress Concern Present (03/16/2023)   [employer] of Occupational Health - Occupational Stress Questionnaire    Feeling of Stress  : To some extent  Social Connections: Unknown (03/16/2023)   Social Connection and Isolation Panel [NHANES]    Frequency of Communication with Friends and Family: More than three times a week    Frequency of Social Gatherings with Friends and Family: Twice a week    Attends Religious Services: Not on Marketing executive or Organizations: Yes    Attends Engineer, structural: More than 4 times per year    Marital Status: Married    Tobacco Counseling Counseling given: Not Answered   Clinical Intake:  Pre-visit preparation completed: Yes        Diabetes: Yes (Followed by pcp)  How often do you need to have someone help you when you read instructions, pamphlets, or other written materials from your doctor or pharmacy?: 1 - Never  Nutrition Risk Assessment: Has the patient had any N/V/D within the last 2 months?  No  Does the patient have any non-healing wounds?  No  Has the patient had any unintentional weight loss or weight gain?  No   Diabetes: Is the patient diabetic?  Yes  If diabetic, was a CBG obtained today?  No Did the patient bring in their glucometer from home?  No  How often do you monitor your CBG's? BID.   Financial Strains and Diabetes Management: Are you having any financial strains with the device, your supplies or your medication? No .  Does the patient want to be seen by Chronic Care Management for management of their diabetes?  No  Would the patient like to be referred to a Nutritionist or for Diabetic Management?  No   Diabetic Exams: Diabetic foot exam due.   Interpreter Needed?: No      Activities of Daily Living    03/16/2023    9:14 PM  In your present state of health, do you have  any difficulty performing the following activities:  Hearing? 0  Vision? 1  Comment Routine visits with eye doctor  Difficulty concentrating or making decisions? 1  Comment Age appropriate  Walking or climbing stairs? 1  Comment Cane in use   Dressing or bathing? 0  Doing errands, shopping? 0  Preparing Food and eating ? Y  Comment Some assist with meal prep due to difficulty standing too long.  Self feeds.  Using the Toilet? N  In the past six months, have you accidently leaked urine? N  Do you have problems with loss of bowel control? N  Managing your Medications? N  Managing your Finances? N  Housekeeping or managing your Housekeeping? Y  Comment Maid assist    Patient Care Team: Dale Caseville, MD as PCP - General (Internal Medicine) Antonieta Iba, MD as PCP - Cardiology (Cardiology)  Indicate any recent Medical Services you may have received from other than [facility] providers in the past year (date may be approximate).     Assessment:   This is a routine wellness examination for [facility].  Patient Medicare AWV questionnaire was completed by the patient on 03/16/23, I have confirmed that all information answered by patient is correct and no changes since this date.   I connected with  Zenia Resides on 03/18/23 by a audio enabled telemedicine application and verified that I am speaking with the correct person using two identifiers.  Patient Location: Home  Provider Location: Office/Clinic  I discussed the limitations of evaluation and management by telemedicine. The patient expressed understanding and agreed to proceed.  Hearing/Vision screen Hearing Screening - Comments:: Patient is able to hear conversational tones without difficulty.  No issues reported.  Vision Screening - Comments:: Followed by [facility] Wears reading lenses Cataract extracted, bilateral No retinopathy reported    Dietary issues and exercise activities discussed: Current Exercise Habits: Home exercise routine, Intensity: Mild   Goals Addressed   None    Depression Screen    03/18/2023   12:56 PM 11/28/2022   12:53 PM 08/17/2022    1:08 PM 05/17/2022   11:30 AM 04/17/2022   10:32 AM 03/09/2022    1:29 PM 01/31/2022   11:32 AM   PHQ 2/9 Scores  PHQ - 2 Score 0 1 0 2 0 0 1  PHQ- 9 Score  1  4 0 0 4    Fall Risk    03/16/2023    9:14 PM 11/28/2022   12:53 PM 08/17/2022    1:08 PM 05/17/2022   11:29 AM 04/17/2022   10:32 AM  Fall Risk   Falls in the past year? 1 0 0 1 0  Comment None since last reported      Number falls in past yr: 1 0 0 0   Injury with Fall? 1 0 0 1   Comment Sought medical care with Ortho      Risk for fall due to :  Impaired mobility No Fall Risks Impaired balance/gait;History of fall(s) No Fall Risks  Follow up Falls evaluation completed;Falls prevention discussed Falls evaluation completed Falls evaluation completed Falls evaluation completed Falls evaluation completed    FALL RISK PREVENTION PERTAINING TO THE HOME: Home free of loose throw rugs in walkways, pet beds, electrical cords, etc? Yes  Adequate lighting in your home to reduce risk of falls? Yes   ASSISTIVE DEVICES UTILIZED TO PREVENT FALLS: Life alert? No  Use of a cane, walker or w/c? Yes   TIMED UP AND  GO: Was the test performed? No .   Cognitive Function:    11/28/2017    4:47 PM 11/28/2016    4:31 PM  MMSE - Mini Mental State Exam  Orientation to time 5 5  Orientation to Place 5 5  Registration 3 3  Attention/ Calculation 5 5  Recall 3 3  Language- name 2 objects 2 2  Language- repeat 1 1  Language- follow 3 step command 3 3  Language- read & follow direction 1 1  Write a sentence 1 1  Copy design 1 1  Total score 30 30        03/18/2023    1:03 PM 03/08/2021    2:38 PM 12/01/2018    4:55 PM  6CIT Screen  What Year? 0 points 0 points 0 points  What month? 0 points 0 points 0 points  What time? 0 points 0 points 0 points  Count back from 20 0 points 0 points 0 points  Months in reverse 0 points 0 points 0 points  Repeat phrase 0 points 0 points 0 points  Total Score 0 points 0 points 0 points    Immunizations Immunization History  Administered Date(s) Administered   Fluad Quad(high Dose 65+)  08/25/2019, 09/12/2020, 08/17/2022   Influenza Split 09/25/2012   Influenza Whole 09/07/2013   Influenza, High Dose Seasonal PF 08/13/2016, 09/02/2017, 08/22/2018, 08/25/2021   Influenza,inj,Quad PF,6+ Mos 07/27/2014, 08/25/2015   PFIZER Comirnaty(Gray Top)Covid-19 Tri-Sucrose Vaccine 12/29/2019, 01/19/2020, 04/24/2021   PFIZER(Purple Top)SARS-COV-2 Vaccination 12/29/2019, 01/19/2020, 09/02/2020   Pfizer Covid-19 Vaccine Bivalent Booster 61yrs & up 09/07/2021   Pneumococcal Conjugate-13 01/18/2014   Pneumococcal Polysaccharide-23 03/15/2012   Respiratory Syncytial Virus Vaccine,Recomb Aduvanted(Arexvy) 11/05/2022   Tdap 02/28/2012, 04/20/2019   Zoster Recombinat (Shingrix) 10/14/2019, 12/21/2019   Zoster, Live 08/27/2012   Screening Tests Health Maintenance  Topic Date Due   FOOT EXAM  09/15/2021   COVID-19 Vaccine (8 - 2023-24 season) 04/03/2023 (Originally 07/20/2022)   Diabetic kidney evaluation - Urine ACR  05/18/2023   HEMOGLOBIN A1C  05/28/2023   INFLUENZA VACCINE  06/20/2023   MAMMOGRAM  11/15/2023   Diabetic kidney evaluation - eGFR measurement  11/28/2023   OPHTHALMOLOGY EXAM  12/04/2023   Medicare Annual Wellness (AWV)  03/17/2024   DTaP/Tdap/Td (3 - Td or Tdap) 04/19/2029   Pneumonia Vaccine 65+ Years old  Completed   DEXA SCAN  Completed   Hepatitis C Screening  Completed   Zoster Vaccines- Shingrix  Completed   HPV VACCINES  Aged Out   COLONOSCOPY (Pts 45-44yrs Insurance coverage will need to be confirmed)  Discontinued    Health Maintenance  Health Maintenance Due  Topic Date Due   FOOT EXAM  09/15/2021   Lung Cancer Screening: (Low Dose CT Chest recommended if Age 43-80 years, 30 pack-year currently smoking OR have quit w/in 15years.) does not qualify.   Vision Screening: Recommended annual ophthalmology exams for early detection of glaucoma and other disorders of the eye.  Dental Screening: Recommended annual dental exams for proper oral  hygiene  Community Resource Referral / Chronic Care Management: CRR required this visit?  No   CCM required this visit?  No      Plan:     I have personally reviewed and noted the following in the patient's chart:   Medical and social history Use of alcohol, tobacco or illicit drugs  Current medications and supplements including opioid prescriptions. Patient is currently taking opioid prescriptions. Information provided to patient regarding non-opioid alternatives. Patient  advised to discuss non-opioid treatment plan with their provider. Followed by [facility]. Functional ability and status Nutritional status Physical activity Advanced directives List of other physicians Hospitalizations, surgeries, and ER visits in previous 12 months Vitals Screenings to include cognitive, depression, and falls Referrals and appointments  In addition, I have reviewed and discussed with patient certain preventive protocols, quality metrics, and best practice recommendations. A written personalized care plan for preventive services as well as general preventive health recommendations were provided to patient.     Cathey Endow, LPN   [id number]

## 2023-03-19 ENCOUNTER — Other Ambulatory Visit: Payer: Self-pay | Admitting: Internal Medicine

## 2023-03-19 DIAGNOSIS — H26491 Other secondary cataract, right eye: Secondary | ICD-10-CM | POA: Diagnosis not present

## 2023-03-19 DIAGNOSIS — J301 Allergic rhinitis due to pollen: Secondary | ICD-10-CM | POA: Diagnosis not present

## 2023-03-19 DIAGNOSIS — H04123 Dry eye syndrome of bilateral lacrimal glands: Secondary | ICD-10-CM | POA: Diagnosis not present

## 2023-03-25 ENCOUNTER — Other Ambulatory Visit (INDEPENDENT_AMBULATORY_CARE_PROVIDER_SITE_OTHER): Payer: Medicare HMO

## 2023-03-25 DIAGNOSIS — E7800 Pure hypercholesterolemia, unspecified: Secondary | ICD-10-CM | POA: Diagnosis not present

## 2023-03-25 DIAGNOSIS — I10 Essential (primary) hypertension: Secondary | ICD-10-CM | POA: Diagnosis not present

## 2023-03-25 DIAGNOSIS — E119 Type 2 diabetes mellitus without complications: Secondary | ICD-10-CM | POA: Diagnosis not present

## 2023-03-25 LAB — TSH: TSH: 1.62 u[IU]/mL (ref 0.35–5.50)

## 2023-03-25 LAB — BASIC METABOLIC PANEL WITH GFR
BUN: 18 mg/dL (ref 6–23)
CO2: 30 meq/L (ref 19–32)
Calcium: 9.7 mg/dL (ref 8.4–10.5)
Chloride: 104 meq/L (ref 96–112)
Creatinine, Ser: 0.82 mg/dL (ref 0.40–1.20)
GFR: 68.77 mL/min
Glucose, Bld: 108 mg/dL — ABNORMAL HIGH (ref 70–99)
Potassium: 3.9 meq/L (ref 3.5–5.1)
Sodium: 143 meq/L (ref 135–145)

## 2023-03-25 LAB — LIPID PANEL
Cholesterol: 166 mg/dL (ref 0–200)
HDL: 74.3 mg/dL
LDL Cholesterol: 81 mg/dL (ref 0–99)
NonHDL: 92.03
Total CHOL/HDL Ratio: 2
Triglycerides: 57 mg/dL (ref 0.0–149.0)
VLDL: 11.4 mg/dL (ref 0.0–40.0)

## 2023-03-25 LAB — HEPATIC FUNCTION PANEL
ALT: 12 U/L (ref 0–35)
AST: 16 U/L (ref 0–37)
Albumin: 4.1 g/dL (ref 3.5–5.2)
Alkaline Phosphatase: 83 U/L (ref 39–117)
Bilirubin, Direct: 0.1 mg/dL (ref 0.0–0.3)
Total Bilirubin: 0.6 mg/dL (ref 0.2–1.2)
Total Protein: 6.4 g/dL (ref 6.0–8.3)

## 2023-03-25 LAB — HEMOGLOBIN A1C: Hgb A1c MFr Bld: 5.9 % (ref 4.6–6.5)

## 2023-03-26 DIAGNOSIS — J301 Allergic rhinitis due to pollen: Secondary | ICD-10-CM | POA: Diagnosis not present

## 2023-03-27 DIAGNOSIS — J301 Allergic rhinitis due to pollen: Secondary | ICD-10-CM | POA: Diagnosis not present

## 2023-03-29 ENCOUNTER — Ambulatory Visit: Payer: Medicare HMO | Admitting: Internal Medicine

## 2023-03-29 DIAGNOSIS — Z48815 Encounter for surgical aftercare following surgery on the digestive system: Secondary | ICD-10-CM | POA: Diagnosis not present

## 2023-03-29 DIAGNOSIS — Z713 Dietary counseling and surveillance: Secondary | ICD-10-CM | POA: Diagnosis not present

## 2023-03-29 DIAGNOSIS — K912 Postsurgical malabsorption, not elsewhere classified: Secondary | ICD-10-CM | POA: Diagnosis not present

## 2023-03-29 DIAGNOSIS — Z9884 Bariatric surgery status: Secondary | ICD-10-CM | POA: Diagnosis not present

## 2023-04-01 ENCOUNTER — Encounter: Payer: Self-pay | Admitting: Internal Medicine

## 2023-04-01 ENCOUNTER — Ambulatory Visit (INDEPENDENT_AMBULATORY_CARE_PROVIDER_SITE_OTHER): Payer: Medicare HMO | Admitting: Internal Medicine

## 2023-04-01 VITALS — BP 126/78 | HR 65 | Temp 97.9°F | Resp 16 | Ht 65.0 in | Wt 204.0 lb

## 2023-04-01 DIAGNOSIS — I10 Essential (primary) hypertension: Secondary | ICD-10-CM | POA: Diagnosis not present

## 2023-04-01 DIAGNOSIS — E7800 Pure hypercholesterolemia, unspecified: Secondary | ICD-10-CM | POA: Diagnosis not present

## 2023-04-01 DIAGNOSIS — E119 Type 2 diabetes mellitus without complications: Secondary | ICD-10-CM

## 2023-04-01 DIAGNOSIS — M5442 Lumbago with sciatica, left side: Secondary | ICD-10-CM | POA: Diagnosis not present

## 2023-04-01 DIAGNOSIS — K219 Gastro-esophageal reflux disease without esophagitis: Secondary | ICD-10-CM | POA: Diagnosis not present

## 2023-04-01 DIAGNOSIS — I700 Atherosclerosis of aorta: Secondary | ICD-10-CM | POA: Diagnosis not present

## 2023-04-01 DIAGNOSIS — F320 Major depressive disorder, single episode, mild: Secondary | ICD-10-CM | POA: Diagnosis not present

## 2023-04-01 DIAGNOSIS — M5441 Lumbago with sciatica, right side: Secondary | ICD-10-CM | POA: Diagnosis not present

## 2023-04-01 NOTE — Progress Notes (Signed)
Subjective:    Patient ID: Erin Camacho, female    DOB: 1946-08-23, 77 y.o.   MRN: [id number]  Patient here for  Chief Complaint  Patient presents with   Medical Management of Chronic Issues    HPI Here to follow up regarding her cholesterol, blood pressure and blood sugar. Recently evaluated for dysphagia and reflux symptoms.  S/p lap gastric band placement.  Saw bariatric surgery - recommended removing fluid from band. Doing better.  No chest pain reported.  Breathing stable.  No increased heart rate.  No increased cough or congestion.  No abdominal pain.  Sees Dr Danielle Dess for her back.  S/p fall.  Left knee - Dr Odis Luster.     Past Medical History:  Diagnosis Date   Allergy    hay fever   Anxiety    Arthritis    Chronic back pain    Diabetes mellitus    Dyspnea    a. 07/2017 Echo: EF 60-65%. No rwma. Mildly dil LA. Nl RV fxn.   GERD (gastroesophageal reflux disease)    History of hiatal hernia    Hyperlipidemia    Hypertension    Osteoarthritis    Pneumonia    Spinal stenosis    Type 2 diabetes mellitus (HCC)    UTI (lower urinary tract infection)    Past Surgical History:  Procedure Laterality Date   ABDOMINAL HYSTERECTOMY  1973   ABDOMINAL HYSTERECTOMY     APPENDECTOMY     APPLICATION OF ROBOTIC ASSISTANCE FOR SPINAL PROCEDURE N/A 09/15/2018   Procedure: APPLICATION OF ROBOTIC ASSISTANCE FOR SPINAL PROCEDURE;  Surgeon: Barnett Abu, MD;  Location: [facility];  Service: Neurosurgery;  Laterality: N/A;   BACK SURGERY  04/17/13, 2017, 2019   CATARACT EXTRACTION W/PHACO Left 12/16/2019   Procedure: CATARACT EXTRACTION PHACO AND INTRAOCULAR LENS PLACEMENT (IOC) LEFT PANOPTIX LENS DIABETIC 8.07 01:19.2 10.2%;  Surgeon: Lockie Mola, MD;  Location: [facility];  Service: Ophthalmology;  Laterality: Left;  Diabetic - oral meds   CATARACT EXTRACTION W/PHACO Right 01/06/2020   Procedure: CATARACT EXTRACTION PHACO AND INTRAOCULAR LENS PLACEMENT (IOC) PANOPTIX LENS RIGHT  DIABETIC 10.73 01:08.6 15.7%;  Surgeon: Lockie Mola, MD;  Location: [facility];  Service: Ophthalmology;  Laterality: Right;  diabetic   CHOLECYSTECTOMY  1995   COLONOSCOPY WITH PROPOFOL N/A 03/15/2021   Procedure: COLONOSCOPY WITH PROPOFOL;  Surgeon: Earline Mayotte, MD;  Location: [facility];  Service: Endoscopy;  Laterality: N/A;   ESOPHAGOGASTRODUODENOSCOPY N/A 06/20/2022   Procedure: ESOPHAGOGASTRODUODENOSCOPY (EGD);  Surgeon: Toledo, Boykin Nearing, MD;  Location: [facility];  Service: Gastroenterology;  Laterality: N/A;   EYE SURGERY  2021   bilateral cataract removal with lens implants   HERNIA REPAIR  08/2006   lap band  08/2003   LUMBAR LAMINECTOMY/ DECOMPRESSION WITH MET-RX Left 12/29/2020   Procedure: Left Thoracic Seven-Eight Microdiscectomy;  Surgeon: Barnett Abu, MD;  Location: [facility] OR;  Service: Neurosurgery;  Laterality: Left;   SEPTOPLASTY  04/1978   TONSILLECTOMY AND ADENOIDECTOMY  1979   TOTAL HIP ARTHROPLASTY  02/2012   Right, Dr. Ernest Pine   TOTAL HIP ARTHROPLASTY Left 08/31/2019   Procedure: TOTAL HIP ARTHROPLASTY;  Surgeon: Donato Heinz, MD;  Location: [facility] ORS;  Service: Orthopedics;  Laterality: Left;   UPPER GI ENDOSCOPY  06/20/2022   Family History  Problem Relation Age of Onset   Hypertension Mother    Stroke Mother    Heart disease Mother    Hypertension Father    Stroke Father  Heart disease Father    Arthritis Sister    Diabetes Sister    Cancer Sister    Diabetes Brother    Hypertension Brother    Dementia Brother    Diabetes Brother    Hypertension Brother    Dementia Brother    Breast cancer Other 44   Cancer Other        breast   Diabetes Other    Kidney disease Neg Hx    Bladder Cancer Neg Hx    Social History   Socioeconomic History   Marital status: Married    Spouse name: Not on file   Number of children: Not on file   Years of education: Not on file   Highest education level: 12th grade  Occupational  History   Not on file  Tobacco Use   Smoking status: Never   Smokeless tobacco: Never  Vaping Use   Vaping Use: Never used  Substance and Sexual Activity   Alcohol use: Yes    Alcohol/week: 0.0 - 1.0 standard drinks of alcohol    Comment: RARELY   Drug use: No   Sexual activity: Not Currently  Other Topics Concern   Not on file  Social History Narrative   Lives in [place] with husband.      Work - retired Surveyor, minerals   Social Determinants of Corporate investment banker Strain: Low Risk  (03/28/2023)   Overall Financial Resource Strain (CARDIA)    Difficulty of Paying Living Expenses: Not hard at all  Food Insecurity: No Food Insecurity (03/28/2023)   Hunger Vital Sign    Worried About Running Out of Food in the Last Year: Never true    Ran Out of Food in the Last Year: Never true  Transportation Needs: No Transportation Needs (03/28/2023)   PRAPARE - Administrator, Civil Service (Medical): No    Lack of Transportation (Non-Medical): No  Physical Activity: Insufficiently Active (03/28/2023)   Exercise Vital Sign    Days of Exercise per Week: 1 day    Minutes of Exercise per Session: 10 min  Stress: Stress Concern Present (03/28/2023)   [employer] of Occupational Health - Occupational Stress Questionnaire    Feeling of Stress : Rather much  Social Connections: Socially Integrated (03/28/2023)   Social Connection and Isolation Panel [NHANES]    Frequency of Communication with Friends and Family: More than three times a week    Frequency of Social Gatherings with Friends and Family: Twice a week    Attends Religious Services: More than 4 times per year    Active Member of [employer] or Organizations: Yes    Attends Engineer, structural: More than 4 times per year    Marital Status: Married     Review of Systems  Constitutional:  Negative for appetite change and unexpected weight change.  HENT:  Negative for congestion and sinus pressure.    Respiratory:  Negative for cough and chest tightness.        Breathing stable.   Cardiovascular:  Negative for chest pain and palpitations.  Gastrointestinal:  Negative for abdominal pain, diarrhea, nausea and vomiting.  Genitourinary:  Negative for difficulty urinating and dysuria.  Musculoskeletal:  Positive for back pain. Negative for myalgias.  Skin:  Negative for color change and rash.  Neurological:  Negative for dizziness and headaches.  Psychiatric/Behavioral:  Negative for agitation and dysphoric mood.        Objective:     BP  126/78   Pulse 65   Temp 97.9 F (36.6 C)   Resp 16   Ht 5\' 5"  (1.651 m)   Wt 204 lb (92.5 kg)   SpO2 98%   BMI 33.95 kg/m  Wt Readings from Last 3 Encounters:  04/01/23 204 lb (92.5 kg)  03/18/23 195 lb (88.5 kg)  11/28/22 195 lb 3.2 oz (88.5 kg)    Physical Exam Vitals reviewed.  Constitutional:      General: She is not in acute distress.    Appearance: Normal appearance.  HENT:     Head: Normocephalic and atraumatic.     Right Ear: External ear normal.     Left Ear: External ear normal.  Eyes:     General: No scleral icterus.       Right eye: No discharge.        Left eye: No discharge.     Conjunctiva/sclera: Conjunctivae normal.  Neck:     Thyroid: No thyromegaly.  Cardiovascular:     Rate and Rhythm: Normal rate and regular rhythm.  Pulmonary:     Effort: No respiratory distress.     Breath sounds: Normal breath sounds. No wheezing.  Abdominal:     General: Bowel sounds are normal.     Palpations: Abdomen is soft.     Tenderness: There is no abdominal tenderness.  Musculoskeletal:        General: No swelling or tenderness.     Cervical back: Neck supple. No tenderness.  Lymphadenopathy:     Cervical: No cervical adenopathy.  Skin:    Findings: No erythema or rash.  Neurological:     Mental Status: She is alert.  Psychiatric:        Mood and Affect: Mood normal.        Behavior: Behavior normal.       Outpatient Encounter Medications as of 04/01/2023  Medication Sig   ALPRAZolam (XANAX) 0.25 MG tablet Take 1 tablet (0.25 mg total) by mouth daily as needed for anxiety.   Azelastine HCl 137 MCG/SPRAY SOLN PLACE 1 SPRAY INTO BOTH NOSTRILS 2 (TWO) TIMES DAILY. USE IN EACH NOSTRIL AS DIRECTED   cholecalciferol (VITAMIN D) 25 MCG (1000 UNIT) tablet Take 1,000 Units by mouth daily.   Ferrous Sulfate (IRON) 325 (65 Fe) MG TABS Take 325 mg by mouth daily.   fexofenadine (ALLEGRA) 180 MG tablet Take 1 tablet (180 mg total) by mouth daily.   fluticasone (FLONASE) 50 MCG/ACT nasal spray SPRAY 2 SPRAYS INTO EACH NOSTRIL EVERY DAY   gabapentin (NEURONTIN) 300 MG capsule Take 300 mg by mouth 2 (two) times daily.   lidocaine (LIDODERM) 5 % SMARTSIG:2 Patch(s) T-DERMAL Daily   losartan-hydrochlorothiazide (HYZAAR) 50-12.5 MG tablet Take 1 tablet by mouth daily.   metFORMIN (GLUCOPHAGE) 500 MG tablet Take 1 tablet (500 mg total) by mouth daily.   Multiple Vitamin (MULTIVITAMIN WITH MINERALS) TABS tablet Take 1 tablet by mouth daily.   OneTouch Delica Lancets 30G MISC Use to check blood sugar x per day. DX E11.9.   ONETOUCH VERIO test strip USE TO CHECK BLOOD SUGAR TWICE A DAY   pantoprazole (PROTONIX) 40 MG tablet Take 1 tablet (40 mg total) by mouth 2 (two) times daily before a meal.   sertraline (ZOLOFT) 100 MG tablet TAKE 1/2 TABLET BY MOUTH (50 MG) BY MOUTH DAILY   simvastatin (ZOCOR) 10 MG tablet TAKE 1/2 TABLET (5 MG TOTAL) BY MOUTH AT BEDTIME.   traMADol (ULTRAM) 50 MG tablet Take 1-2 tablets  as needed for severe pain. Not to exceed two doses (4 tablets, 200 mg) daily   traZODone (DESYREL) 100 MG tablet TAKE 1 TABLET BY MOUTH EVERYDAY AT BEDTIME   No facility-administered encounter medications on file as of 04/01/2023.     Lab Results  Component Value Date   WBC 5.5 07/30/2022   HGB 12.4 07/30/2022   HCT 39.2 07/30/2022   PLT 174 07/30/2022   GLUCOSE 108 (H) 03/25/2023   CHOL 166  03/25/2023   TRIG 57.0 03/25/2023   HDL 74.30 03/25/2023   LDLDIRECT 146.5 09/19/2012   LDLCALC 81 03/25/2023   ALT 12 03/25/2023   AST 16 03/25/2023   NA 143 03/25/2023   K 3.9 03/25/2023   CL 104 03/25/2023   CREATININE 0.82 03/25/2023   BUN 18 03/25/2023   CO2 30 03/25/2023   TSH 1.62 03/25/2023   INR 1.0 08/21/2019   HGBA1C 5.9 03/25/2023   MICROALBUR 1.2 05/17/2022    DG ESOPHAGUS W SINGLE CM (SOL OR THIN BA)  Result Date: 02/04/2023 CLINICAL DATA:  Esophageal dysphagia, history of bariatric surgery in 2004. EXAM: ESOPHOGRAM/BARIUM SWALLOW TECHNIQUE: Single contrast examination was performed using  thick barium. FLUOROSCOPY: Radiation Exposure Index (as provided by the fluoroscopic device): 15.1 mGy COMPARISON:  07/29/2019 FINDINGS: Normal pharyngeal anatomy and motility. Contrast flowed freely through the esophagus without evidence of a stricture or mass. Normal esophageal mucosa without evidence of irregularity or ulceration. 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 No evidence of reflux. No definite hiatal hernia was demonstrated. Gastric lap band in satisfactory position. Severe luminal narrowing resulting from the gastric band with only a trace amount of contrast traversing the gastric band over a 3-4 minute period. IMPRESSION: 1. Gastric lap band in satisfactory position. Severe luminal narrowing resulting from the gastric band with only a trace amount of contrast traversing the gastric band over a 3-4 minute period. Recommend bariatric surgery consultation as soon as possible. 2. 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 Electronically Signed   By: Elige Ko M.D.   On: 02/04/2023 11:47       Assessment & Plan:  Aortic atherosclerosis (HCC) Assessment & Plan: Continue simvastatin.    Depression, major, single episode, mild (HCC) Assessment & Plan: Continue on zoloft.  Increased stress as outlined.   Discussed.  Does not feel needs any further intervention. Follow.     Diabetes mellitus type 2, diet-controlled (HCC) Assessment & Plan: Low carb diet and exercise.  Follow met b and a1c.   Follow.  Stay hydrated.   Lab Results  Component Value Date   HGBA1C 5.9 03/25/2023     Essential (primary) hypertension Assessment & Plan: Blood pressure as outlined.  Continue losartan/hctz.  Follow pressures.  Follow metabolic panel.    Gastroesophageal reflux disease, unspecified whether esophagitis present Assessment & Plan: No acid reflux reported.  On protonix.    Hypercholesterolemia Assessment & Plan: Continue simvastatin.  Low cholesterol diet and exercise.  Follow lipid panel and liver function tests.     Primary hypertension Assessment & Plan: Blood pressure as outlined.  Continue losartan/hctz.  Follow pressures.  Follow metabolic panel.     Bilateral low back pain with bilateral sciatica, unspecified chronicity Assessment & Plan: F/u with Dr Danielle Dess.       Dale Pinewood, MD

## 2023-04-01 NOTE — Progress Notes (Signed)
 Subjective:    Patient ID: Erin Camacho, female    DOB: 03/30/1946, 76 y.o.   MRN: [id number]  Patient here for  Chief Complaint  Patient presents with   Medical Management of Chronic Issues    HPI Here to follow up regarding her cholesterol, blood pressure and blood sugar. Recently evaluated for dysphagia and reflux symptoms.  S/p lap gastric band placement.  Saw bariatric surgery - recommended removing fluid from band.    Past Medical History:  Diagnosis Date   Allergy    hay fever   Anxiety    Arthritis    Chronic back pain    Diabetes mellitus    Dyspnea    a. 07/2017 Echo: EF 60-65%. No rwma. Mildly dil LA. Nl RV fxn.   GERD (gastroesophageal reflux disease)    History of hiatal hernia    Hyperlipidemia    Hypertension    Osteoarthritis    Pneumonia    Spinal stenosis    Type 2 diabetes mellitus (HCC)    UTI (lower urinary tract infection)    Past Surgical History:  Procedure Laterality Date   ABDOMINAL HYSTERECTOMY  1973   ABDOMINAL HYSTERECTOMY     APPENDECTOMY     APPLICATION OF ROBOTIC ASSISTANCE FOR SPINAL PROCEDURE N/A 09/15/2018   Procedure: APPLICATION OF ROBOTIC ASSISTANCE FOR SPINAL PROCEDURE;  Surgeon: Elsner, Henry, MD;  Location: [facility] OR;  Service: Neurosurgery;  Laterality: N/A;   BACK SURGERY  04/17/13, 2017, 2019   CATARACT EXTRACTION W/PHACO Left 12/16/2019   Procedure: CATARACT EXTRACTION PHACO AND INTRAOCULAR LENS PLACEMENT (IOC) LEFT PANOPTIX LENS DIABETIC 8.07 01:19.2 10.2%;  Surgeon: Brasington, Chadwick, MD;  Location: [facility];  Service: Ophthalmology;  Laterality: Left;  Diabetic - oral meds   CATARACT EXTRACTION W/PHACO Right 01/06/2020   Procedure: CATARACT EXTRACTION PHACO AND INTRAOCULAR LENS PLACEMENT (IOC) PANOPTIX LENS RIGHT DIABETIC 10.73 01:08.6 15.7%;  Surgeon: Brasington, Chadwick, MD;  Location: [facility];  Service: Ophthalmology;  Laterality: Right;  diabetic   CHOLECYSTECTOMY  1995   COLONOSCOPY WITH  PROPOFOL N/A 03/15/2021   Procedure: COLONOSCOPY WITH PROPOFOL;  Surgeon: Byrnett, Jeffrey W, MD;  Location: [facility];  Service: Endoscopy;  Laterality: N/A;   ESOPHAGOGASTRODUODENOSCOPY N/A 06/20/2022   Procedure: ESOPHAGOGASTRODUODENOSCOPY (EGD);  Surgeon: Toledo, Teodoro K, MD;  Location: [facility];  Service: Gastroenterology;  Laterality: N/A;   EYE SURGERY  2021   bilateral cataract removal with lens implants   HERNIA REPAIR  08/2006   lap band  08/2003   LUMBAR LAMINECTOMY/ DECOMPRESSION WITH MET-RX Left 12/29/2020   Procedure: Left Thoracic Seven-Eight Microdiscectomy;  Surgeon: Elsner, Henry, MD;  Location: [facility];  Service: Neurosurgery;  Laterality: Left;   SEPTOPLASTY  04/1978   TONSILLECTOMY AND ADENOIDECTOMY  1979   TOTAL HIP ARTHROPLASTY  02/2012   Right, Dr. Hooten   TOTAL HIP ARTHROPLASTY Left 08/31/2019   Procedure: TOTAL HIP ARTHROPLASTY;  Surgeon: Hooten, James P, MD;  Location: [facility];  Service: Orthopedics;  Laterality: Left;   UPPER GI ENDOSCOPY  06/20/2022   Family History  Problem Relation Age of Onset   Hypertension Mother    Stroke Mother    Heart disease Mother    Hypertension Father    Stroke Father    Heart disease Father    Arthritis Sister    Diabetes Sister    Cancer Sister    Diabetes Brother    Hypertension Brother    Dementia Brother    Diabetes Brother      Hypertension Brother    Dementia Brother    Breast cancer Other 47   Cancer Other        breast   Diabetes Other    Kidney disease Neg Hx    Bladder Cancer Neg Hx    Social History   Socioeconomic History   Marital status: Married    Spouse name: Not on file   Number of children: Not on file   Years of education: Not on file   Highest education level: 12th grade  Occupational History   Not on file  Tobacco Use   Smoking status: Never   Smokeless tobacco: Never  Vaping Use   Vaping Use: Never used  Substance and Sexual Activity   Alcohol use: Yes    Alcohol/week:  0.0 - 1.0 standard drinks of alcohol    Comment: RARELY   Drug use: No   Sexual activity: Not Currently  Other Topics Concern   Not on file  Social History Narrative   Lives in [place] with husband.      Work - retired [employer]   Social Determinants of Health   Financial Resource Strain: Low Risk  (03/28/2023)   Overall Financial Resource Strain (CARDIA)    Difficulty of Paying Living Expenses: Not hard at all  Food Insecurity: No Food Insecurity (03/28/2023)   Hunger Vital Sign    Worried About Running Out of Food in the Last Year: Never true    Ran Out of Food in the Last Year: Never true  Transportation Needs: No Transportation Needs (03/28/2023)   PRAPARE - Transportation    Lack of Transportation (Medical): No    Lack of Transportation (Non-Medical): No  Physical Activity: Insufficiently Active (03/28/2023)   Exercise Vital Sign    Days of Exercise per Week: 1 day    Minutes of Exercise per Session: 10 min  Stress: Stress Concern Present (03/28/2023)   [employer]ational [employer] - Occupational Stress Questionnaire    Feeling of Stress : Rather much  Social Connections: Socially Integrated (03/28/2023)   Social Connection and Isolation Panel [NHANES]    Frequency of Communication with Friends and Family: More than three times a week    Frequency of Social Gatherings with Friends and Family: Twice a week    Attends Religious Services: More than 4 times per year    Active Member of Clubs or Organizations: Yes    Attends Club or Organization Meetings: More than 4 times per year    Marital Status: Married     Review of Systems     Objective:     BP 126/78   Pulse 65   Temp 97.9 F (36.6 C)   Resp 16   Ht 5' 5" (1.651 m)   Wt 204 lb (92.5 kg)   SpO2 98%   BMI 33.95 kg/m  Wt Readings from Last 3 Encounters:  04/01/23 204 lb (92.5 kg)  03/18/23 195 lb (88.5 kg)  11/28/22 195 lb 3.2 oz (88.5 kg)    Physical Exam   Outpatient Encounter  Medications as of 04/01/2023  Medication Sig   ALPRAZolam (XANAX) 0.25 MG tablet Take 1 tablet (0.25 mg total) by mouth daily as needed for anxiety.   Azelastine HCl 137 MCG/SPRAY SOLN PLACE 1 SPRAY INTO BOTH NOSTRILS 2 (TWO) TIMES DAILY. USE IN EACH NOSTRIL AS DIRECTED   cholecalciferol (VITAMIN D) 25 MCG (1000 UNIT) tablet Take 1,000 Units by mouth daily.   Ferrous Sulfate (IRON) 325 (65 Fe)   MG TABS Take 325 mg by mouth daily.   fexofenadine (ALLEGRA) 180 MG tablet Take 1 tablet (180 mg total) by mouth daily.   fluticasone (FLONASE) 50 MCG/ACT nasal spray SPRAY 2 SPRAYS INTO EACH NOSTRIL EVERY DAY   gabapentin (NEURONTIN) 300 MG capsule Take 300 mg by mouth 2 (two) times daily.   lidocaine (LIDODERM) 5 % SMARTSIG:2 Patch(s) T-DERMAL Daily   losartan-hydrochlorothiazide (HYZAAR) 50-12.5 MG tablet Take 1 tablet by mouth daily.   metFORMIN (GLUCOPHAGE) 500 MG tablet Take 1 tablet (500 mg total) by mouth daily.   Multiple Vitamin (MULTIVITAMIN WITH MINERALS) TABS tablet Take 1 tablet by mouth daily.   OneTouch Delica Lancets 30G MISC Use to check blood sugar x per day. DX E11.9.   ONETOUCH VERIO test strip USE TO CHECK BLOOD SUGAR TWICE A DAY   pantoprazole (PROTONIX) 40 MG tablet Take 1 tablet (40 mg total) by mouth 2 (two) times daily before a meal.   sertraline (ZOLOFT) 100 MG tablet TAKE 1/2 TABLET BY MOUTH (50 MG) BY MOUTH DAILY   simvastatin (ZOCOR) 10 MG tablet TAKE 1/2 TABLET (5 MG TOTAL) BY MOUTH AT BEDTIME.   traMADol (ULTRAM) 50 MG tablet Take 1-2 tablets as needed for severe pain. Not to exceed two doses (4 tablets, 200 mg) daily   traZODone (DESYREL) 100 MG tablet TAKE 1 TABLET BY MOUTH EVERYDAY AT BEDTIME   No facility-administered encounter medications on file as of 04/01/2023.     Lab Results  Component Value Date   WBC 5.5 07/30/2022   HGB 12.4 07/30/2022   HCT 39.2 07/30/2022   PLT 174 07/30/2022   GLUCOSE 108 (H) 03/25/2023   CHOL 166 03/25/2023   TRIG 57.0 03/25/2023    HDL 74.30 03/25/2023   LDLDIRECT 146.5 09/19/2012   LDLCALC 81 03/25/2023   ALT 12 03/25/2023   AST 16 03/25/2023   NA 143 03/25/2023   K 3.9 03/25/2023   CL 104 03/25/2023   CREATININE 0.82 03/25/2023   BUN 18 03/25/2023   CO2 30 03/25/2023   TSH 1.62 03/25/2023   INR 1.0 08/21/2019   HGBA1C 5.9 03/25/2023   MICROALBUR 1.2 05/17/2022    DG ESOPHAGUS W SINGLE CM (SOL OR THIN BA)  Result Date: 02/04/2023 CLINICAL DATA:  Esophageal dysphagia, history of bariatric surgery in 2004. EXAM: ESOPHOGRAM/BARIUM SWALLOW TECHNIQUE: Single contrast examination was performed using  thick barium. FLUOROSCOPY: Radiation Exposure Index (as provided by the fluoroscopic device): 15.1 mGy COMPARISON:  07/29/2019 FINDINGS: Normal pharyngeal anatomy and motility. Contrast flowed freely through the esophagus without evidence of a stricture or mass. Normal esophageal mucosa without evidence of irregularity or ulceration. 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 No evidence of reflux. No definite hiatal hernia was demonstrated. Gastric lap band in satisfactory position. Severe luminal narrowing resulting from the gastric band with only a trace amount of contrast traversing the gastric band over a 3-4 minute period. IMPRESSION: 1. Gastric lap band in satisfactory position. Severe luminal narrowing resulting from the gastric band with only a trace amount of contrast traversing the gastric band over a 3-4 minute period. Recommend bariatric surgery consultation as soon as possible. 2. 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 Electronically Signed   By: Hetal  Patel M.D.   On: 02/04/2023 11:47       Assessment & Plan:  There are no diagnoses linked to this encounter.   Chanler Mendonca, MD 

## 2023-04-05 ENCOUNTER — Telehealth: Payer: Self-pay | Admitting: Internal Medicine

## 2023-04-05 NOTE — Telephone Encounter (Signed)
Pt would like to be called regarding a call she received about being approved for 180 days of pain management

## 2023-04-05 NOTE — Telephone Encounter (Signed)
Patient was calling to let me know that she received a call from [place] about being approved for pain management. Patient stated to disregard message because after she called she remembered that she has injections coming up Monday with Dr Danielle Dess and she thinks this is related to their office. Will clarify with them on Monday and let me know if she needs anything further from me.

## 2023-04-08 DIAGNOSIS — M5414 Radiculopathy, thoracic region: Secondary | ICD-10-CM | POA: Diagnosis not present

## 2023-04-08 DIAGNOSIS — M5114 Intervertebral disc disorders with radiculopathy, thoracic region: Secondary | ICD-10-CM | POA: Diagnosis not present

## 2023-04-14 ENCOUNTER — Encounter: Payer: Self-pay | Admitting: Internal Medicine

## 2023-04-14 NOTE — Assessment & Plan Note (Signed)
F/u with Dr Danielle Dess.

## 2023-04-14 NOTE — Assessment & Plan Note (Signed)
Blood pressure as outlined.  Continue losartan/hctz.  Follow pressures.  Follow metabolic panel.  

## 2023-04-14 NOTE — Assessment & Plan Note (Signed)
Continue simvastatin. 

## 2023-04-14 NOTE — Assessment & Plan Note (Signed)
Continue simvastatin.  Low cholesterol diet and exercise.  Follow lipid panel and liver function tests.   

## 2023-04-14 NOTE — Assessment & Plan Note (Signed)
Low carb diet and exercise.  Follow met b and a1c.   Follow.  Stay hydrated.   Lab Results  Component Value Date   HGBA1C 5.9 03/25/2023

## 2023-04-14 NOTE — Assessment & Plan Note (Signed)
Continue on zoloft.  Increased stress as outlined.  Discussed.  Does not feel needs any further intervention. Follow.

## 2023-04-14 NOTE — Assessment & Plan Note (Signed)
No acid reflux reported.  On protonix.   

## 2023-04-23 DIAGNOSIS — J301 Allergic rhinitis due to pollen: Secondary | ICD-10-CM | POA: Diagnosis not present

## 2023-04-30 DIAGNOSIS — J301 Allergic rhinitis due to pollen: Secondary | ICD-10-CM | POA: Diagnosis not present

## 2023-05-07 DIAGNOSIS — J301 Allergic rhinitis due to pollen: Secondary | ICD-10-CM | POA: Diagnosis not present

## 2023-05-14 DIAGNOSIS — J301 Allergic rhinitis due to pollen: Secondary | ICD-10-CM | POA: Diagnosis not present

## 2023-05-21 DIAGNOSIS — J301 Allergic rhinitis due to pollen: Secondary | ICD-10-CM | POA: Diagnosis not present

## 2023-05-24 DIAGNOSIS — Z9884 Bariatric surgery status: Secondary | ICD-10-CM | POA: Diagnosis not present

## 2023-05-24 DIAGNOSIS — Z9889 Other specified postprocedural states: Secondary | ICD-10-CM | POA: Diagnosis not present

## 2023-05-24 DIAGNOSIS — R151 Fecal smearing: Secondary | ICD-10-CM | POA: Diagnosis not present

## 2023-05-24 DIAGNOSIS — K219 Gastro-esophageal reflux disease without esophagitis: Secondary | ICD-10-CM | POA: Diagnosis not present

## 2023-05-24 DIAGNOSIS — Z8719 Personal history of other diseases of the digestive system: Secondary | ICD-10-CM | POA: Diagnosis not present

## 2023-05-24 DIAGNOSIS — M5136 Other intervertebral disc degeneration, lumbar region: Secondary | ICD-10-CM | POA: Diagnosis not present

## 2023-05-24 DIAGNOSIS — E119 Type 2 diabetes mellitus without complications: Secondary | ICD-10-CM | POA: Diagnosis not present

## 2023-05-24 DIAGNOSIS — Z87898 Personal history of other specified conditions: Secondary | ICD-10-CM | POA: Diagnosis not present

## 2023-05-24 DIAGNOSIS — G894 Chronic pain syndrome: Secondary | ICD-10-CM | POA: Diagnosis not present

## 2023-05-27 ENCOUNTER — Encounter: Payer: Self-pay | Admitting: Internal Medicine

## 2023-05-27 DIAGNOSIS — H26491 Other secondary cataract, right eye: Secondary | ICD-10-CM | POA: Diagnosis not present

## 2023-05-28 DIAGNOSIS — J301 Allergic rhinitis due to pollen: Secondary | ICD-10-CM | POA: Diagnosis not present

## 2023-06-02 ENCOUNTER — Other Ambulatory Visit: Payer: Self-pay | Admitting: Internal Medicine

## 2023-06-04 DIAGNOSIS — J301 Allergic rhinitis due to pollen: Secondary | ICD-10-CM | POA: Diagnosis not present

## 2023-06-11 DIAGNOSIS — J301 Allergic rhinitis due to pollen: Secondary | ICD-10-CM | POA: Diagnosis not present

## 2023-06-17 ENCOUNTER — Other Ambulatory Visit: Payer: Self-pay | Admitting: Internal Medicine

## 2023-06-17 DIAGNOSIS — J301 Allergic rhinitis due to pollen: Secondary | ICD-10-CM | POA: Diagnosis not present

## 2023-06-20 ENCOUNTER — Other Ambulatory Visit: Payer: Self-pay | Admitting: Internal Medicine

## 2023-06-24 ENCOUNTER — Telehealth: Payer: Self-pay | Admitting: Internal Medicine

## 2023-06-24 DIAGNOSIS — E119 Type 2 diabetes mellitus without complications: Secondary | ICD-10-CM

## 2023-06-24 DIAGNOSIS — E7800 Pure hypercholesterolemia, unspecified: Secondary | ICD-10-CM

## 2023-06-24 DIAGNOSIS — I10 Essential (primary) hypertension: Secondary | ICD-10-CM

## 2023-06-24 NOTE — Telephone Encounter (Signed)
Patient need lab orders.

## 2023-06-24 NOTE — Telephone Encounter (Signed)
Orders placed for labs

## 2023-06-25 DIAGNOSIS — J301 Allergic rhinitis due to pollen: Secondary | ICD-10-CM | POA: Diagnosis not present

## 2023-06-26 DIAGNOSIS — J301 Allergic rhinitis due to pollen: Secondary | ICD-10-CM | POA: Diagnosis not present

## 2023-06-28 ENCOUNTER — Other Ambulatory Visit (INDEPENDENT_AMBULATORY_CARE_PROVIDER_SITE_OTHER): Payer: Medicare HMO

## 2023-06-28 DIAGNOSIS — I10 Essential (primary) hypertension: Secondary | ICD-10-CM

## 2023-06-28 DIAGNOSIS — E7800 Pure hypercholesterolemia, unspecified: Secondary | ICD-10-CM | POA: Diagnosis not present

## 2023-06-28 DIAGNOSIS — E119 Type 2 diabetes mellitus without complications: Secondary | ICD-10-CM | POA: Diagnosis not present

## 2023-06-28 DIAGNOSIS — Z7984 Long term (current) use of oral hypoglycemic drugs: Secondary | ICD-10-CM

## 2023-06-28 LAB — BASIC METABOLIC PANEL WITH GFR
BUN: 22 mg/dL (ref 6–23)
CO2: 28 meq/L (ref 19–32)
Calcium: 9.8 mg/dL (ref 8.4–10.5)
Chloride: 104 meq/L (ref 96–112)
Creatinine, Ser: 0.87 mg/dL (ref 0.40–1.20)
GFR: 63.94 mL/min
Glucose, Bld: 111 mg/dL — ABNORMAL HIGH (ref 70–99)
Potassium: 4.2 meq/L (ref 3.5–5.1)
Sodium: 142 meq/L (ref 135–145)

## 2023-06-28 LAB — MICROALBUMIN / CREATININE URINE RATIO
Creatinine,U: 113.4 mg/dL
Microalb Creat Ratio: 0.6 mg/g (ref 0.0–30.0)
Microalb, Ur: 0.7 mg/dL (ref 0.0–1.9)

## 2023-06-28 LAB — LIPID PANEL
Cholesterol: 173 mg/dL (ref 0–200)
HDL: 69.9 mg/dL
LDL Cholesterol: 87 mg/dL (ref 0–99)
NonHDL: 102.9
Total CHOL/HDL Ratio: 2
Triglycerides: 80 mg/dL (ref 0.0–149.0)
VLDL: 16 mg/dL (ref 0.0–40.0)

## 2023-06-28 LAB — CBC WITH DIFFERENTIAL/PLATELET
Basophils Absolute: 0 10*3/uL (ref 0.0–0.1)
Basophils Relative: 0.9 % (ref 0.0–3.0)
Eosinophils Absolute: 0.2 10*3/uL (ref 0.0–0.7)
Eosinophils Relative: 3.7 % (ref 0.0–5.0)
HCT: 38.7 % (ref 36.0–46.0)
Hemoglobin: 12.7 g/dL (ref 12.0–15.0)
Lymphocytes Relative: 26.1 % (ref 12.0–46.0)
Lymphs Abs: 1.1 10*3/uL (ref 0.7–4.0)
MCHC: 32.8 g/dL (ref 30.0–36.0)
MCV: 94.8 fl (ref 78.0–100.0)
Monocytes Absolute: 0.4 10*3/uL (ref 0.1–1.0)
Monocytes Relative: 9 % (ref 3.0–12.0)
Neutro Abs: 2.5 10*3/uL (ref 1.4–7.7)
Neutrophils Relative %: 60.3 % (ref 43.0–77.0)
Platelets: 186 10*3/uL (ref 150.0–400.0)
RBC: 4.09 Mil/uL (ref 3.87–5.11)
RDW: 14.7 % (ref 11.5–15.5)
WBC: 4.1 10*3/uL (ref 4.0–10.5)

## 2023-06-28 LAB — HEPATIC FUNCTION PANEL
ALT: 13 U/L (ref 0–35)
AST: 18 U/L (ref 0–37)
Albumin: 4.3 g/dL (ref 3.5–5.2)
Alkaline Phosphatase: 69 U/L (ref 39–117)
Bilirubin, Direct: 0.1 mg/dL (ref 0.0–0.3)
Total Bilirubin: 0.7 mg/dL (ref 0.2–1.2)
Total Protein: 6.5 g/dL (ref 6.0–8.3)

## 2023-06-28 LAB — HEMOGLOBIN A1C: Hgb A1c MFr Bld: 5.9 % (ref 4.6–6.5)

## 2023-07-01 ENCOUNTER — Other Ambulatory Visit: Payer: Medicare HMO

## 2023-07-02 ENCOUNTER — Encounter: Payer: Medicare HMO | Admitting: Internal Medicine

## 2023-07-02 DIAGNOSIS — J301 Allergic rhinitis due to pollen: Secondary | ICD-10-CM | POA: Diagnosis not present

## 2023-07-02 DIAGNOSIS — M65342 Trigger finger, left ring finger: Secondary | ICD-10-CM | POA: Diagnosis not present

## 2023-07-03 ENCOUNTER — Encounter: Payer: Medicare HMO | Admitting: Internal Medicine

## 2023-07-03 ENCOUNTER — Encounter: Payer: Self-pay | Admitting: Internal Medicine

## 2023-07-03 ENCOUNTER — Other Ambulatory Visit: Payer: Medicare HMO

## 2023-07-03 DIAGNOSIS — M6530 Trigger finger, unspecified finger: Secondary | ICD-10-CM | POA: Insufficient documentation

## 2023-07-05 ENCOUNTER — Ambulatory Visit (INDEPENDENT_AMBULATORY_CARE_PROVIDER_SITE_OTHER): Payer: Medicare HMO | Admitting: Internal Medicine

## 2023-07-05 VITALS — BP 128/70 | HR 66 | Temp 97.9°F | Resp 16 | Ht 65.0 in | Wt 212.0 lb

## 2023-07-05 DIAGNOSIS — M5442 Lumbago with sciatica, left side: Secondary | ICD-10-CM

## 2023-07-05 DIAGNOSIS — E7800 Pure hypercholesterolemia, unspecified: Secondary | ICD-10-CM

## 2023-07-05 DIAGNOSIS — M79605 Pain in left leg: Secondary | ICD-10-CM | POA: Diagnosis not present

## 2023-07-05 DIAGNOSIS — I700 Atherosclerosis of aorta: Secondary | ICD-10-CM | POA: Diagnosis not present

## 2023-07-05 DIAGNOSIS — M79604 Pain in right leg: Secondary | ICD-10-CM

## 2023-07-05 DIAGNOSIS — M5441 Lumbago with sciatica, right side: Secondary | ICD-10-CM | POA: Diagnosis not present

## 2023-07-05 DIAGNOSIS — Z0000 Encounter for general adult medical examination without abnormal findings: Secondary | ICD-10-CM | POA: Diagnosis not present

## 2023-07-05 DIAGNOSIS — K219 Gastro-esophageal reflux disease without esophagitis: Secondary | ICD-10-CM | POA: Diagnosis not present

## 2023-07-05 DIAGNOSIS — Z9884 Bariatric surgery status: Secondary | ICD-10-CM

## 2023-07-05 DIAGNOSIS — M48062 Spinal stenosis, lumbar region with neurogenic claudication: Secondary | ICD-10-CM

## 2023-07-05 DIAGNOSIS — I10 Essential (primary) hypertension: Secondary | ICD-10-CM

## 2023-07-05 DIAGNOSIS — E119 Type 2 diabetes mellitus without complications: Secondary | ICD-10-CM | POA: Diagnosis not present

## 2023-07-05 DIAGNOSIS — M7541 Impingement syndrome of right shoulder: Secondary | ICD-10-CM | POA: Diagnosis not present

## 2023-07-05 DIAGNOSIS — F320 Major depressive disorder, single episode, mild: Secondary | ICD-10-CM | POA: Diagnosis not present

## 2023-07-05 MED ORDER — SEMAGLUTIDE(0.25 OR 0.5MG/DOS) 2 MG/3ML ~~LOC~~ SOPN: 0.2500 mg | PEN_INJECTOR | SUBCUTANEOUS | 1 refills | Status: DC

## 2023-07-05 NOTE — Assessment & Plan Note (Signed)
Physical today 07/05/23.  Mammogram 11/14/22 - Birads I.  Colonoscopy 03/13/21 - tubular adenoma (rectal polyp).  Recommended f/u in 7 years.

## 2023-07-05 NOTE — Progress Notes (Unsigned)
Subjective:    Patient ID: Erin Camacho, female    DOB: [birth year], 77 y.o.   MRN: [id number]  Patient here for  Chief Complaint  Patient presents with   Annual Exam    HPI Here for a physical exam. Recently evaluated for dysphagia and reflux symptoms. S/p lap gastric band placement. Saw bariatric surgery - recommended removing fluid from band. Doing better. Sees Dr Danielle Dess for her back. S/p fall. Left knee - Dr Odis Luster. Saw GI 05/24/23 - doing better.  No changes made.  Continues on zoloft.   Past Medical History:  Diagnosis Date   Allergy    hay fever   Anxiety    Arthritis    Chronic back pain    Diabetes mellitus    Dyspnea    a. 07/2017 Echo: EF 60-65%. No rwma. Mildly dil LA. Nl RV fxn.   GERD (gastroesophageal reflux disease)    History of hiatal hernia    Hyperlipidemia    Hypertension    Osteoarthritis    Pneumonia    Spinal stenosis    Type 2 diabetes mellitus (HCC)    UTI (lower urinary tract infection)    Past Surgical History:  Procedure Laterality Date   ABDOMINAL HYSTERECTOMY  1973   ABDOMINAL HYSTERECTOMY     APPENDECTOMY     APPLICATION OF ROBOTIC ASSISTANCE FOR SPINAL PROCEDURE N/A 09/15/2018   Procedure: APPLICATION OF ROBOTIC ASSISTANCE FOR SPINAL PROCEDURE;  Surgeon: Barnett Abu, MD;  Location: [facility] OR;  Service: Neurosurgery;  Laterality: N/A;   BACK SURGERY  04/17/13, 2017, 2019   CATARACT EXTRACTION W/PHACO Left 12/16/2019   Procedure: CATARACT EXTRACTION PHACO AND INTRAOCULAR LENS PLACEMENT (IOC) LEFT PANOPTIX LENS DIABETIC 8.07 01:19.2 10.2%;  Surgeon: Lockie Mola, MD;  Location: [facility];  Service: Ophthalmology;  Laterality: Left;  Diabetic - oral meds   CATARACT EXTRACTION W/PHACO Right 01/06/2020   Procedure: CATARACT EXTRACTION PHACO AND INTRAOCULAR LENS PLACEMENT (IOC) PANOPTIX LENS RIGHT DIABETIC 10.73 01:08.6 15.7%;  Surgeon: Lockie Mola, MD;  Location: [facility];  Service: Ophthalmology;  Laterality:  Right;  diabetic   CHOLECYSTECTOMY  1995   COLONOSCOPY WITH PROPOFOL N/A 03/15/2021   Procedure: COLONOSCOPY WITH PROPOFOL;  Surgeon: Earline Mayotte, MD;  Location: [facility];  Service: Endoscopy;  Laterality: N/A;   ESOPHAGOGASTRODUODENOSCOPY N/A 06/20/2022   Procedure: ESOPHAGOGASTRODUODENOSCOPY (EGD);  Surgeon: Toledo, Boykin Nearing, MD;  Location: [facility];  Service: Gastroenterology;  Laterality: N/A;   EYE SURGERY  2021   bilateral cataract removal with lens implants   HERNIA REPAIR  08/2006   lap band  08/2003   LUMBAR LAMINECTOMY/ DECOMPRESSION WITH MET-RX Left 12/29/2020   Procedure: Left Thoracic Seven-Eight Microdiscectomy;  Surgeon: Barnett Abu, MD;  Location: [facility] OR;  Service: Neurosurgery;  Laterality: Left;   SEPTOPLASTY  04/1978   TONSILLECTOMY AND ADENOIDECTOMY  1979   TOTAL HIP ARTHROPLASTY  02/2012   Right, Dr. Ernest Pine   TOTAL HIP ARTHROPLASTY Left 08/31/2019   Procedure: TOTAL HIP ARTHROPLASTY;  Surgeon: Donato Heinz, MD;  Location: [facility];  Service: Orthopedics;  Laterality: Left;   UPPER GI ENDOSCOPY  06/20/2022   Family History  Problem Relation Age of Onset   Hypertension Mother    Stroke Mother    Heart disease Mother    Hypertension Father    Stroke Father    Heart disease Father    Arthritis Sister    Diabetes Sister    Cancer Sister    Diabetes Brother  Hypertension Brother    Dementia Brother    Diabetes Brother    Hypertension Brother    Dementia Brother    Breast cancer Other 15   Cancer Other        breast   Diabetes Other    Kidney disease Neg Hx    Bladder Cancer Neg Hx    Social History   Socioeconomic History   Marital status: Married    Spouse name: Not on file   Number of children: Not on file   Years of education: Not on file   Highest education level: 12th grade  Occupational History   Not on file  Tobacco Use   Smoking status: Never   Smokeless tobacco: Never  Vaping Use   Vaping status: Never Used   Substance and Sexual Activity   Alcohol use: Yes    Alcohol/week: 0.0 - 1.0 standard drinks of alcohol    Comment: RARELY   Drug use: No   Sexual activity: Not Currently  Other Topics Concern   Not on file  Social History Narrative   Lives in [place] with husband.      Work - retired Surveyor, minerals   Social Determinants of Corporate investment banker Strain: Low Risk  (03/28/2023)   Overall Financial Resource Strain (CARDIA)    Difficulty of Paying Living Expenses: Not hard at all  Food Insecurity: No Food Insecurity (03/28/2023)   Hunger Vital Sign    Worried About Running Out of Food in the Last Year: Never true    Ran Out of Food in the Last Year: Never true  Transportation Needs: No Transportation Needs (03/28/2023)   PRAPARE - Administrator, Civil Service (Medical): No    Lack of Transportation (Non-Medical): No  Physical Activity: Insufficiently Active (03/28/2023)   Exercise Vital Sign    Days of Exercise per Week: 1 day    Minutes of Exercise per Session: 10 min  Stress: Stress Concern Present (03/28/2023)   [employer] of Occupational Health - Occupational Stress Questionnaire    Feeling of Stress : Rather much  Social Connections: Socially Integrated (03/28/2023)   Social Connection and Isolation Panel [NHANES]    Frequency of Communication with Friends and Family: More than three times a week    Frequency of Social Gatherings with Friends and Family: Twice a week    Attends Religious Services: More than 4 times per year    Active Member of [employer] or Organizations: Yes    Attends Engineer, structural: More than 4 times per year    Marital Status: Married     Review of Systems     Objective:     BP 128/70   Pulse 66   Temp 97.9 F (36.6 C)   Resp 16   Ht 5\' 5"  (1.651 m)   Wt 212 lb (96.2 kg)   SpO2 98%   BMI 35.28 kg/m  Wt Readings from Last 3 Encounters:  07/05/23 212 lb (96.2 kg)  04/01/23 204 lb (92.5 kg)  03/18/23 195 lb  (88.5 kg)    Physical Exam   Outpatient Encounter Medications as of 07/05/2023  Medication Sig   Semaglutide,0.25 or 0.5MG /DOS, 2 MG/3ML SOPN Inject 0.25 mg into the skin once a week.   ALPRAZolam (XANAX) 0.25 MG tablet Take 1 tablet (0.25 mg total) by mouth daily as needed for anxiety.   cholecalciferol (VITAMIN D) 25 MCG (1000 UNIT) tablet Take 1,000 Units by mouth daily.  Ferrous Sulfate (IRON) 325 (65 Fe) MG TABS Take 325 mg by mouth daily.   fexofenadine (ALLEGRA) 180 MG tablet Take 1 tablet (180 mg total) by mouth daily.   fluticasone (FLONASE) 50 MCG/ACT nasal spray SPRAY 2 SPRAYS INTO EACH NOSTRIL EVERY DAY   gabapentin (NEURONTIN) 300 MG capsule Take 300 mg by mouth 2 (two) times daily.   lidocaine (LIDODERM) 5 % SMARTSIG:2 Patch(s) T-DERMAL Daily   losartan-hydrochlorothiazide (HYZAAR) 50-12.5 MG tablet Take 1 tablet by mouth daily.   metFORMIN (GLUCOPHAGE) 500 MG tablet TAKE 1 TABLET (500 MG TOTAL) BY MOUTH DAILY.   Multiple Vitamin (MULTIVITAMIN WITH MINERALS) TABS tablet Take 1 tablet by mouth daily.   OneTouch Delica Lancets 30G MISC Use to check blood sugar x per day. DX E11.9.   ONETOUCH VERIO test strip USE TO CHECK BLOOD SUGAR TWICE A DAY   pantoprazole (PROTONIX) 40 MG tablet Take 1 tablet (40 mg total) by mouth 2 (two) times daily before a meal.   sertraline (ZOLOFT) 100 MG tablet TAKE 1/2 TABLET BY MOUTH (50 MG) BY MOUTH DAILY   simvastatin (ZOCOR) 10 MG tablet TAKE 1/2 TABLET (5 MG TOTAL) BY MOUTH AT BEDTIME.   traMADol (ULTRAM) 50 MG tablet Take 1-2 tablets as needed for severe pain. Not to exceed two doses (4 tablets, 200 mg) daily   traZODone (DESYREL) 100 MG tablet TAKE 1 TABLET BY MOUTH EVERYDAY AT BEDTIME   [DISCONTINUED] Azelastine HCl 137 MCG/SPRAY SOLN PLACE 1 SPRAY INTO BOTH NOSTRILS 2 (TWO) TIMES DAILY. USE IN EACH NOSTRIL AS DIRECTED   No facility-administered encounter medications on file as of 07/05/2023.     Lab Results  Component Value Date   WBC  4.1 06/28/2023   HGB 12.7 06/28/2023   HCT 38.7 06/28/2023   PLT 186.0 06/28/2023   GLUCOSE 111 (H) 06/28/2023   CHOL 173 06/28/2023   TRIG 80.0 06/28/2023   HDL 69.90 06/28/2023   LDLDIRECT 146.5 09/19/2012   LDLCALC 87 06/28/2023   ALT 13 06/28/2023   AST 18 06/28/2023   NA 142 06/28/2023   K 4.2 06/28/2023   CL 104 06/28/2023   CREATININE 0.87 06/28/2023   BUN 22 06/28/2023   CO2 28 06/28/2023   TSH 1.62 03/25/2023   INR 1.0 08/21/2019   HGBA1C 5.9 06/28/2023   MICROALBUR 0.7 06/28/2023    DG ESOPHAGUS W SINGLE CM (SOL OR THIN BA)  Result Date: 02/04/2023 CLINICAL DATA:  Esophageal dysphagia, history of bariatric surgery in 2004. EXAM: ESOPHOGRAM/BARIUM SWALLOW TECHNIQUE: Single contrast examination was performed using  thick barium. FLUOROSCOPY: Radiation Exposure Index (as provided by the fluoroscopic device): 15.1 mGy COMPARISON:  07/29/2019 FINDINGS: Normal pharyngeal anatomy and motility. Contrast flowed freely through the esophagus without evidence of a stricture or mass. Normal esophageal mucosa without evidence of irregularity or ulceration. 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 No evidence of reflux. No definite hiatal hernia was demonstrated. Gastric lap band in satisfactory position. Severe luminal narrowing resulting from the gastric band with only a trace amount of contrast traversing the gastric band over a 3-4 minute period. IMPRESSION: 1. Gastric lap band in satisfactory position. Severe luminal narrowing resulting from the gastric band with only a trace amount of contrast traversing the gastric band over a 3-4 minute period. Recommend bariatric surgery consultation as soon as possible. 2. 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 Electronically Signed   By: Elige Ko M.D.   On:  02/04/2023 11:47       Assessment & Plan:  Healthcare maintenance Assessment & Plan: Physical today 07/05/23.   Mammogram 11/14/22 - Birads I.  Colonoscopy 03/13/21 - tubular adenoma (rectal polyp).  Recommended f/u in 7 years.     Pain in both lower extremities -     Ambulatory referral to Physical Therapy  Other orders -     Semaglutide(0.25 or 0.5MG /DOS); Inject 0.25 mg into the skin once a week.  Dispense: 3 mL; Refill: 1     Dale Peak, MD

## 2023-07-06 ENCOUNTER — Encounter: Payer: Self-pay | Admitting: Internal Medicine

## 2023-07-06 NOTE — Assessment & Plan Note (Signed)
Sees Dr Danielle Dess for her back. S/p fall. Left knee - Dr Odis Luster. Does report problems with both legs.  Describes numbness.  Also report weakness - upper thighs.  Does have significant back issues as outlined.  Is limited in walking due to her legs.  Discussed using a walker to help with ambulation. Does better in stores with a cart she can lean on.  Also discussed PT. She has seen Dr Sherryll Burger. Discussed f/u for evaluation of the numbness.

## 2023-07-06 NOTE — Assessment & Plan Note (Signed)
Blood pressure as outlined.  Continue losartan/hctz.  Follow pressures.  Follow metabolic panel.  

## 2023-07-06 NOTE — Assessment & Plan Note (Signed)
Continue simvastatin. 

## 2023-07-06 NOTE — Assessment & Plan Note (Signed)
Continue on zoloft.  Increased stress as outlined.  Discussed.  Does not feel needs any further intervention. Follow.

## 2023-07-06 NOTE — Assessment & Plan Note (Signed)
Continue simvastatin.  Low cholesterol diet and exercise.  Follow lipid panel and liver function tests.   

## 2023-07-06 NOTE — Assessment & Plan Note (Signed)
Low carb diet and exercise.  Follow met b and a1c.   Desires to stay GLP 1 agonist as outlined.  Saw GI 05/24/23.  Dysphagia secondary to tight LAGB. Rsolved after fluid removed. GI had recommended starting ozempic for diabetes and weight loss.  She is interested in starting. Discussed her swallowing issues.  This has not been an issue after above resolved.  Discussed GLP 1 agonist. Discussed possible side effects of the medication.  Will start ozempic .25mg . Follow closely.  Lab Results  Component Value Date   HGBA1C 5.9 06/28/2023

## 2023-07-06 NOTE — Assessment & Plan Note (Signed)
Saw GI 05/24/23.  Dysphagia secondary to tight LAGB. Rsolved after fluid removed. GI had recommended starting ozempic for diabetes and weight loss.  She is interested in starting. Discussed her swallowing issues.  This has not been an issue after above resolved.  Discussed GLP 1 agonist. Discussed possible side effects of the medication.

## 2023-07-06 NOTE — Assessment & Plan Note (Signed)
No acid reflux reported.  On protonix.  

## 2023-07-08 ENCOUNTER — Encounter: Payer: Medicare HMO | Admitting: Internal Medicine

## 2023-07-09 DIAGNOSIS — J301 Allergic rhinitis due to pollen: Secondary | ICD-10-CM | POA: Diagnosis not present

## 2023-07-15 ENCOUNTER — Encounter: Payer: Self-pay | Admitting: Internal Medicine

## 2023-07-16 DIAGNOSIS — J301 Allergic rhinitis due to pollen: Secondary | ICD-10-CM | POA: Diagnosis not present

## 2023-07-16 NOTE — Telephone Encounter (Signed)
The referrals have been placed for PT and for neurology (Dr Sherryll Burger).  Ms Drummey sent a message about appts.  Can you help with this?

## 2023-07-16 NOTE — Telephone Encounter (Signed)
Please call and notify - recommend low carb diet and exercise as tolerated.

## 2023-07-17 NOTE — Telephone Encounter (Signed)
Thank you.  Is the patient aware.  Thanks

## 2023-07-17 NOTE — Telephone Encounter (Signed)
Pt was notified and numbers for both clinics where given to pt.    [facility]: [phone]  [facility] Neurology: [phone]

## 2023-07-18 ENCOUNTER — Ambulatory Visit: Payer: Medicare HMO | Attending: Internal Medicine

## 2023-07-18 ENCOUNTER — Encounter: Payer: Self-pay | Admitting: Physical Therapy

## 2023-07-18 DIAGNOSIS — M5459 Other low back pain: Secondary | ICD-10-CM | POA: Insufficient documentation

## 2023-07-18 DIAGNOSIS — R262 Difficulty in walking, not elsewhere classified: Secondary | ICD-10-CM | POA: Insufficient documentation

## 2023-07-18 DIAGNOSIS — M79605 Pain in left leg: Secondary | ICD-10-CM | POA: Diagnosis not present

## 2023-07-18 DIAGNOSIS — M6281 Muscle weakness (generalized): Secondary | ICD-10-CM | POA: Diagnosis present

## 2023-07-18 DIAGNOSIS — M79604 Pain in right leg: Secondary | ICD-10-CM | POA: Diagnosis not present

## 2023-07-18 NOTE — Therapy (Signed)
OUTPATIENT PHYSICAL THERAPY THORACOLUMBAR/LOWER EXTREMITY EVALUATION   Patient Name: Erin Camacho MRN: [id number] DOB:05-12-1946, 77 y.o., female Today's Date: 07/18/2023  END OF SESSION:  PT End of Session - 07/18/23 1400     Visit Number 1    Number of Visits 17    Date for PT Re-Evaluation 09/12/23    PT Start Time 1416    PT Stop Time 1500    PT Time Calculation (min) 44 min    Activity Tolerance Patient tolerated treatment well    Behavior During Therapy [facility] for tasks assessed/performed             Past Medical History:  Diagnosis Date   Allergy    hay fever   Anxiety    Arthritis    Chronic back pain    Diabetes mellitus    Dyspnea    a. 07/2017 Echo: EF 60-65%. No rwma. Mildly dil LA. Nl RV fxn.   GERD (gastroesophageal reflux disease)    History of hiatal hernia    Hyperlipidemia    Hypertension    Osteoarthritis    Pneumonia    Spinal stenosis    Type 2 diabetes mellitus (HCC)    UTI (lower urinary tract infection)    Past Surgical History:  Procedure Laterality Date   ABDOMINAL HYSTERECTOMY  1973   ABDOMINAL HYSTERECTOMY     APPENDECTOMY     APPLICATION OF ROBOTIC ASSISTANCE FOR SPINAL PROCEDURE N/A 09/15/2018   Procedure: APPLICATION OF ROBOTIC ASSISTANCE FOR SPINAL PROCEDURE;  Surgeon: Barnett Abu, MD;  Location: [facility] OR;  Service: Neurosurgery;  Laterality: N/A;   BACK SURGERY  04/17/13, 2017, 2019   CATARACT EXTRACTION W/PHACO Left 12/16/2019   Procedure: CATARACT EXTRACTION PHACO AND INTRAOCULAR LENS PLACEMENT (IOC) LEFT PANOPTIX LENS DIABETIC 8.07 01:19.2 10.2%;  Surgeon: Lockie Mola, MD;  Location: [facility];  Service: Ophthalmology;  Laterality: Left;  Diabetic - oral meds   CATARACT EXTRACTION W/PHACO Right 01/06/2020   Procedure: CATARACT EXTRACTION PHACO AND INTRAOCULAR LENS PLACEMENT (IOC) PANOPTIX LENS RIGHT DIABETIC 10.73 01:08.6 15.7%;  Surgeon: Lockie Mola, MD;  Location: [facility];  Service:  Ophthalmology;  Laterality: Right;  diabetic   CHOLECYSTECTOMY  1995   COLONOSCOPY WITH PROPOFOL N/A 03/15/2021   Procedure: COLONOSCOPY WITH PROPOFOL;  Surgeon: Earline Mayotte, MD;  Location: [facility];  Service: Endoscopy;  Laterality: N/A;   ESOPHAGOGASTRODUODENOSCOPY N/A 06/20/2022   Procedure: ESOPHAGOGASTRODUODENOSCOPY (EGD);  Surgeon: Toledo, Boykin Nearing, MD;  Location: [facility];  Service: Gastroenterology;  Laterality: N/A;   EYE SURGERY  2021   bilateral cataract removal with lens implants   HERNIA REPAIR  08/2006   lap band  08/2003   LUMBAR LAMINECTOMY/ DECOMPRESSION WITH MET-RX Left 12/29/2020   Procedure: Left Thoracic Seven-Eight Microdiscectomy;  Surgeon: Barnett Abu, MD;  Location: [facility] OR;  Service: Neurosurgery;  Laterality: Left;   SEPTOPLASTY  04/1978   TONSILLECTOMY AND ADENOIDECTOMY  1979   TOTAL HIP ARTHROPLASTY  02/2012   Right, Dr. Ernest Pine   TOTAL HIP ARTHROPLASTY Left 08/31/2019   Procedure: TOTAL HIP ARTHROPLASTY;  Surgeon: Donato Heinz, MD;  Location: [facility];  Service: Orthopedics;  Laterality: Left;   UPPER GI ENDOSCOPY  06/20/2022   Patient Active Problem List   Diagnosis Date Noted   Trigger finger 07/03/2023   Leg pain 11/29/2022   Right leg numbness 08/20/2022   Memory change 08/17/2022   Bilateral carotid artery stenosis 01/31/2022   Balance problem 01/31/2022   History of COVID-19 11/25/2021  Fall 05/22/2021   Herniated nucleus pulposus with myelopathy, thoracic 12/29/2020   Low back pain 09/15/2020   Opioid use (10 MME/day) 05/16/2020   Hypokalemia 04/28/2020   Chronic pain syndrome 04/20/2020   Disorder of skeletal system 04/20/2020   Chronic thoracic spine pain (Bilateral) (R>L) 04/20/2020   Myofascial pain syndrome of thoracic spine 04/20/2020   Chronic musculoskeletal pain 04/20/2020   Spasm of thoracic back muscle 04/20/2020   Weight loss 12/31/2019   History of total hip replacement (Left) 08/31/2019   Early satiety  07/05/2019   Lumbar stenosis with neurogenic claudication 09/15/2018   Shortness of breath 06/19/2018   Cough 04/13/2018   Aortic atherosclerosis (HCC) 01/11/2018   GERD (gastroesophageal reflux disease) 12/23/2016   Atrophic vaginitis 03/18/2016   Spondylolisthesis at L5-S1 level 12/06/2015   Rhinitis, allergic 11/22/2015   Right leg pain 07/27/2014   Neuritis or radiculitis due to rupture of lumbar intervertebral disc 06/28/2014   Chronic shoulder pain (Bilateral) 02/24/2014   History of bariatric surgery 01/18/2014   Status post bariatric surgery 01/18/2014   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   Depression, major, single episode, mild (HCC) 05/14/2013   Major depressive disorder, single episode 05/14/2013   Healthcare maintenance 01/07/2013   Hypercholesterolemia 08/22/2012   Hyperlipidemia 08/22/2012   Hypertension 07/07/2012   Diabetes mellitus type 2, diet-controlled (HCC) 07/07/2012   Insomnia 07/07/2012   Obesity (BMI 30-39.9) 07/07/2012   Essential (primary) hypertension 07/07/2012   Chronic low back pain (1ry area of Pain) (Bilateral) (R>L) w/o sciatica 10/18/2011    PCP: Dale La Paz, MD  REFERRING PROVIDER: Dale [place], MD  REFERRING DIAG: M79.604,M79.605 (ICD-10-CM) - Pain in both lower extremities  Rationale for Evaluation and Treatment: Rehabilitation  THERAPY DIAG:  Muscle weakness (generalized)  Difficulty in walking, not elsewhere classified  Other low back pain  ONSET DATE: after 2013 (after R hip replacement)  SUBJECTIVE:                                                                                                                                                                                           SUBJECTIVE STATEMENT: Patient reports BLE weakness and numbness. Numbness is constant on anterolateral thighs but gets worse with standing >10 minutes. Patient unable to ambulate her block around her home. Difficulty with stairs  (step to pattern). Patient has had 4 back surgeries - L1-S1 fusion and "rods" T8/9-L1 starting in 2014. Has had L and R hip replacements.   PERTINENT HISTORY:  Patient has been seeing Dr. Danielle Dess for her back with radicular pain. Has had a fall 4-5 months ago and hurt her L knee. Per Dr.  Scott's note from 07/05/23, patient reports weakness in upper thighs and limited in walking due to her legs. Dr. Lorin Picket discussed with patient about utilizing a [place] to help with patient reporting she does better in stores with a cart she can lean on.   PAIN:  Are you having pain? Yes: NPRS scale: 4-5/10 at worst  Pain location: B legs  Pain description: restless Aggravating factors: laying down, standing for prolonged periods of time  Relieving factors: movement, medication   PRECAUTIONS: Fall  WEIGHT BEARING RESTRICTIONS: No  FALLS:  Has patient fallen in last 6 months? Yes. Number of falls 1  LIVING ENVIRONMENT: Lives with: lives with their spouse Lives in: House/apartment Stairs: No Has following equipment at home: Single point cane and Environmental consultant - 4 wheeled  OCCUPATION: retired   PLOF: Independent  PATIENT GOALS: to be able to walk a distance and not hurt   NEXT MD VISIT: 09/2023 (PCP)  OBJECTIVE:   DIAGNOSTIC FINDINGS:  N/A - unable to obtain due to outside provider   PATIENT SURVEYS:  FOTO 31 with goal of 48  SCREENING FOR RED FLAGS: Bowel or bladder incontinence: Yes: at times, patient reports unaware of bowel movement until trip to the restroom.  No issues with bladder incontinence.  Spinal tumors: No Cauda equina syndrome: No Compression fracture: No Abdominal aneurysm: No  COGNITION: Overall cognitive status: Within functional limits for tasks assessed     SENSATION: Sensory testing WFL, however patient reports numbness on anterolateral parts of B thighs  POSTURE: rounded shoulders, forward head, and increased thoracic kyphosis  PALPATION: Nontender to lumbar and  thoracic paraspinals  Notable piriformis tightness bilaterally with FADIR  LUMBAR ROM:   AROM eval  Flexion 80%  Extension 50%  Right lateral flexion To mid thigh*  Left lateral flexion To mid thigh*  Right rotation 50% *  Left rotation 50% *   (Blank rows = not tested)   *=pain   LOWER EXTREMITY MMT:    MMT Right eval Left eval  Hip flexion 4- 4-  Hip extension    Hip abduction 4- 4-  Hip adduction 4- 4-  Hip internal rotation    Hip external rotation    Knee flexion 4 4  Knee extension 4 4  Ankle dorsiflexion 4- 4-   (Blank rows = not tested)  LUMBAR SPECIAL TESTS:  Straight leg raise test: Negative and FABER test: Positive; FADIR; positive on B LE   FUNCTIONAL TESTS:  5 times sit to stand: 42.11 sec 6 minute walk test: NT on eval Dynamic Gait Index: NT on eval   GAIT: Distance walked: 11' Assistive device utilized: None Level of assistance: Modified independence Comments: decreased stance time bilaterally, mild L/R lateral lean    TODAY'S TREATMENT:                                                                                                                              DATE: 07/18/23  HEP handout provided with patient demonstrating understanding of technique to improve LE strength.   PATIENT EDUCATION:  Education details: HEP, POC, goals Person educated: Patient Education method: Explanation, Demonstration, and Handouts Education comprehension: verbalized understanding and returned demonstration  HOME EXERCISE PROGRAM: Access Code: [email or web address] URL: [email or web address] Date: 07/18/2023 Prepared by: Maylon Peppers  Exercises - Standing Hip Abduction with Counter Support  - 2-3 x daily - 5-7 x weekly - 3 sets - 10 reps - Standing March with Counter Support  - 2-3 x daily - 5-7 x weekly - 3 sets - 10 reps - Heel Raises with Counter Support  - 2-3 x daily - 5-7 x weekly - 3 sets - 10 reps - Sit to Stand with Counter Support  - 2-3 x  daily - 5-7 x weekly - 3 sets - 10 reps  ASSESSMENT:  CLINICAL IMPRESSION: Patient is a 77 y.o. female who was seen today for physical therapy evaluation and treatment for B LE weakness and back pain with associated balance deficits. The listed impairments create difficulty standing for prolonged periods, distance walking, housework, ADLs, and hobbies. Demonstrates piriformis tightness, BLE weakness, balance deficits, and decreased activity tolerance. HEP initiated focused on B LE strengthening in standing. Patient will benefit from skilled PT interventions to address listed impairments to improve overall mobility and reduce pain during ADLs and walking.   OBJECTIVE IMPAIRMENTS: Abnormal gait, cardiopulmonary status limiting activity, decreased activity tolerance, decreased balance, decreased endurance, decreased mobility, difficulty walking, decreased strength, improper body mechanics, postural dysfunction, and pain.   ACTIVITY LIMITATIONS: bending, standing, squatting, sleeping, stairs, transfers, and locomotion level  PARTICIPATION LIMITATIONS: cleaning, laundry, shopping, and community activity  PERSONAL FACTORS: Age, Behavior pattern, Fitness, and Past/current experiences are also affecting patient's functional outcome.   REHAB POTENTIAL: Good  CLINICAL DECISION MAKING: Stable/uncomplicated  EVALUATION COMPLEXITY: Low   GOALS: Goals reviewed with patient? Yes  SHORT TERM GOALS: Target date: 08/15/2023  Patient will be independent in HEP to improve strength/mobility for better functional independence with ADLs. Baseline: 8/29: HEP initiated Goal status: INITIAL  LONG TERM GOALS: Target date: 09/12/2023  Patient will increase FOTO score to equal to or greater than 48 to demonstrate statistically significant improvement in mobility and quality of life.  Baseline: 8/29: 31 Goal status: INITIAL  2.  Patient (> 94 years old) will complete five times sit to stand test in < 15  seconds indicating an increased LE strength and improved balance. Baseline: 8/29: 42.11 seconds Goal status: INITIAL  3.  Patient will increase six minute walk test distance by 200' for progression to community ambulator and improve gait ability. Baseline: 8/29: to be tested on 2nd visit Goal status: INITIAL  4.  Patient will increase BLE gross strength to 4+/5 as to improve functional strength for independent gait, increased standing tolerance and increased ADL ability. Baseline: 8/29: see above  Goal status: INITIAL  5.  Patient will increase Dynamic Gait Index (DGI) score to >19/24 as to demonstrate reduced fall risk and improved dynamic gait balance for better safety with community/home ambulation.  Baseline: 8/29: to be tested on 2nd visit  Goal status: INITIAL   PLAN:  PT FREQUENCY: 1-2x/week  PT DURATION: 8 weeks  PLANNED INTERVENTIONS: Therapeutic exercises, Therapeutic activity, Neuromuscular re-education, Balance training, Gait training, Patient/Family education, Self Care, Joint mobilization, Stair training, DME instructions, Aquatic Therapy, Dry Needling, Spinal mobilization, Cryotherapy, Moist heat, and Manual therapy.  PLAN FOR NEXT SESSION: , DGI, LE strengthening/stretching, balance, GIVE SEATED PIRIFORMIS STRETCH   Moishy Laday  Dan Humphreys PT, DPT Physical Therapist -   [facility]  07/18/2023, 3:42 PM

## 2023-07-23 DIAGNOSIS — J301 Allergic rhinitis due to pollen: Secondary | ICD-10-CM | POA: Diagnosis not present

## 2023-07-25 ENCOUNTER — Ambulatory Visit: Payer: Medicare HMO | Attending: Internal Medicine

## 2023-07-25 DIAGNOSIS — R262 Difficulty in walking, not elsewhere classified: Secondary | ICD-10-CM | POA: Insufficient documentation

## 2023-07-25 DIAGNOSIS — M6281 Muscle weakness (generalized): Secondary | ICD-10-CM | POA: Diagnosis present

## 2023-07-25 DIAGNOSIS — M5459 Other low back pain: Secondary | ICD-10-CM | POA: Diagnosis not present

## 2023-07-25 NOTE — Therapy (Signed)
OUTPATIENT PHYSICAL THERAPY THORACOLUMBAR/LOWER EXTREMITY EVALUATION   Patient Name: Erin Camacho MRN: [id number] DOB:02-18-1946, 77 y.o., female Today's Date: 07/25/2023  END OF SESSION:  PT End of Session - 07/25/23 1558     Visit Number 2    Number of Visits 17    Date for PT Re-Evaluation 09/12/23    PT Start Time 0230    PT Stop Time 0315    PT Time Calculation (min) 45 min    Equipment Utilized During Treatment Gait belt    Activity Tolerance Patient tolerated treatment well              Past Medical History:  Diagnosis Date   Allergy    hay fever   Anxiety    Arthritis    Chronic back pain    Diabetes mellitus    Dyspnea    a. 07/2017 Echo: EF 60-65%. No rwma. Mildly dil LA. Nl RV fxn.   GERD (gastroesophageal reflux disease)    History of hiatal hernia    Hyperlipidemia    Hypertension    Osteoarthritis    Pneumonia    Spinal stenosis    Type 2 diabetes mellitus (HCC)    UTI (lower urinary tract infection)    Past Surgical History:  Procedure Laterality Date   ABDOMINAL HYSTERECTOMY  1973   ABDOMINAL HYSTERECTOMY     APPENDECTOMY     APPLICATION OF ROBOTIC ASSISTANCE FOR SPINAL PROCEDURE N/A 09/15/2018   Procedure: APPLICATION OF ROBOTIC ASSISTANCE FOR SPINAL PROCEDURE;  Surgeon: Barnett Abu, MD;  Location: [facility] OR;  Service: Neurosurgery;  Laterality: N/A;   BACK SURGERY  04/17/13, 2017, 2019   CATARACT EXTRACTION W/PHACO Left 12/16/2019   Procedure: CATARACT EXTRACTION PHACO AND INTRAOCULAR LENS PLACEMENT (IOC) LEFT PANOPTIX LENS DIABETIC 8.07 01:19.2 10.2%;  Surgeon: Lockie Mola, MD;  Location: [facility];  Service: Ophthalmology;  Laterality: Left;  Diabetic - oral meds   CATARACT EXTRACTION W/PHACO Right 01/06/2020   Procedure: CATARACT EXTRACTION PHACO AND INTRAOCULAR LENS PLACEMENT (IOC) PANOPTIX LENS RIGHT DIABETIC 10.73 01:08.6 15.7%;  Surgeon: Lockie Mola, MD;  Location: [facility];  Service: Ophthalmology;   Laterality: Right;  diabetic   CHOLECYSTECTOMY  1995   COLONOSCOPY WITH PROPOFOL N/A 03/15/2021   Procedure: COLONOSCOPY WITH PROPOFOL;  Surgeon: Earline Mayotte, MD;  Location: [facility];  Service: Endoscopy;  Laterality: N/A;   ESOPHAGOGASTRODUODENOSCOPY N/A 06/20/2022   Procedure: ESOPHAGOGASTRODUODENOSCOPY (EGD);  Surgeon: Toledo, Boykin Nearing, MD;  Location: [facility];  Service: Gastroenterology;  Laterality: N/A;   EYE SURGERY  2021   bilateral cataract removal with lens implants   HERNIA REPAIR  08/2006   lap band  08/2003   LUMBAR LAMINECTOMY/ DECOMPRESSION WITH MET-RX Left 12/29/2020   Procedure: Left Thoracic Seven-Eight Microdiscectomy;  Surgeon: Barnett Abu, MD;  Location: [facility];  Service: Neurosurgery;  Laterality: Left;   SEPTOPLASTY  04/1978   TONSILLECTOMY AND ADENOIDECTOMY  1979   TOTAL HIP ARTHROPLASTY  02/2012   Right, Dr. Ernest Pine   TOTAL HIP ARTHROPLASTY Left 08/31/2019   Procedure: TOTAL HIP ARTHROPLASTY;  Surgeon: Donato Heinz, MD;  Location: [facility];  Service: Orthopedics;  Laterality: Left;   UPPER GI ENDOSCOPY  06/20/2022   Patient Active Problem List   Diagnosis Date Noted   Trigger finger 07/03/2023   Leg pain 11/29/2022   Right leg numbness 08/20/2022   Memory change 08/17/2022   Bilateral carotid artery stenosis 01/31/2022   Balance problem 01/31/2022   History of COVID-19 11/25/2021  Fall 05/22/2021   Herniated nucleus pulposus with myelopathy, thoracic 12/29/2020   Low back pain 09/15/2020   Opioid use (10 MME/day) 05/16/2020   Hypokalemia 04/28/2020   Chronic pain syndrome 04/20/2020   Disorder of skeletal system 04/20/2020   Chronic thoracic spine pain (Bilateral) (R>L) 04/20/2020   Myofascial pain syndrome of thoracic spine 04/20/2020   Chronic musculoskeletal pain 04/20/2020   Spasm of thoracic back muscle 04/20/2020   Weight loss 12/31/2019   History of total hip replacement (Left) 08/31/2019   Early satiety 07/05/2019    Lumbar stenosis with neurogenic claudication 09/15/2018   Shortness of breath 06/19/2018   Cough 04/13/2018   Aortic atherosclerosis (HCC) 01/11/2018   GERD (gastroesophageal reflux disease) 12/23/2016   Atrophic vaginitis 03/18/2016   Spondylolisthesis at L5-S1 level 12/06/2015   Rhinitis, allergic 11/22/2015   Right leg pain 07/27/2014   Neuritis or radiculitis due to rupture of lumbar intervertebral disc 06/28/2014   Chronic shoulder pain (Bilateral) 02/24/2014   History of bariatric surgery 01/18/2014   Status post bariatric surgery 01/18/2014   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   Depression, major, single episode, mild (HCC) 05/14/2013   Major depressive disorder, single episode 05/14/2013   Healthcare maintenance 01/07/2013   Hypercholesterolemia 08/22/2012   Hyperlipidemia 08/22/2012   Hypertension 07/07/2012   Diabetes mellitus type 2, diet-controlled (HCC) 07/07/2012   Insomnia 07/07/2012   Obesity (BMI 30-39.9) 07/07/2012   Essential (primary) hypertension 07/07/2012   Chronic low back pain (1ry area of Pain) (Bilateral) (R>L) w/o sciatica 10/18/2011    PCP: Dale Confluence, MD  REFERRING PROVIDER: Dale Shenandoah, MD  REFERRING DIAG: M79.604,M79.605 (ICD-10-CM) - Pain in both lower extremities  Rationale for Evaluation and Treatment: Rehabilitation  THERAPY DIAG:  Muscle weakness (generalized)  Difficulty in walking, not elsewhere classified  Other low back pain  ONSET DATE: after 2013 (after R hip replacement)  SUBJECTIVE:                                                                                                                                                                                           SUBJECTIVE STATEMENT: Patient reports BLE weakness and numbness. Numbness is constant on anterolateral thighs but gets worse with standing >10 minutes. Patient unable to ambulate her block around her home. Difficulty with stairs (step to  pattern). Patient has had 4 back surgeries - L1-S1 fusion and "rods" T8/9-L1 starting in 2014. Has had L and R hip replacements.   PERTINENT HISTORY:  Patient has been seeing Dr. Danielle Dess for her back with radicular pain. Has had a fall 4-5 months ago and hurt her L knee. Per Dr.  Scott's note from 07/05/23, patient reports weakness in upper thighs and limited in walking due to her legs. Dr. Lorin Picket discussed with patient about utilizing a walker to help with patient reporting she does better in stores with a cart she can lean on.   PAIN:  Are you having pain? Yes: NPRS scale: 4-5/10 at worst  Pain location: B legs  Pain description: restless Aggravating factors: laying down, standing for prolonged periods of time  Relieving factors: movement, medication   PRECAUTIONS: Fall  WEIGHT BEARING RESTRICTIONS: No  FALLS:  Has patient fallen in last 6 months? Yes. Number of falls 1  LIVING ENVIRONMENT:   Lives with: lives with their spouse Lives in: House/apartment Stairs: No Has following equipment at home: Single point cane and Environmental consultant - 4 wheeled  OCCUPATION: retired   PLOF: Independent  PATIENT GOALS: to be able to walk a distance and not hurt   NEXT MD VISIT: 09/2023 (PCP)  OBJECTIVE:   DIAGNOSTIC FINDINGS:  N/A - unable to obtain due to outside provider   PATIENT SURVEYS:  FOTO 31 with goal of 48  SCREENING FOR RED FLAGS: Bowel or bladder incontinence: Yes: at times, patient reports unaware of bowel movement until trip to the restroom.  No issues with bladder incontinence.  Spinal tumors: No Cauda equina syndrome: No Compression fracture: No Abdominal aneurysm: No  COGNITION: Overall cognitive status: Within functional limits for tasks assessed     SENSATION: Sensory testing WFL, however patient reports numbness on anterolateral parts of B thighs  POSTURE: rounded shoulders, forward head, and increased thoracic kyphosis  PALPATION: Nontender to lumbar and thoracic  paraspinals  Notable piriformis tightness bilaterally with FADIR  LUMBAR ROM:   AROM eval  Flexion 80%  Extension 50%  Right lateral flexion To mid thigh*  Left lateral flexion To mid thigh*  Right rotation 50% *  Left rotation 50% *   (Blank rows = not tested)   *=pain   LOWER EXTREMITY MMT:    MMT Right eval Left eval  Hip flexion 4- 4-  Hip extension    Hip abduction 4- 4-  Hip adduction 4- 4-  Hip internal rotation    Hip external rotation    Knee flexion 4 4  Knee extension 4 4  Ankle dorsiflexion 4- 4-   (Blank rows = not tested)  LUMBAR SPECIAL TESTS:  Straight leg raise test: Negative and FABER test: Positive; FADIR; positive on B LE   FUNCTIONAL TESTS:  5 times sit to stand: 42.11 sec 6 minute walk test: NT on eval Dynamic Gait Index: NT on eval   GAIT: Distance walked: 50' Assistive device utilized: None Level of assistance: Modified independence Comments: decreased stance time bilaterally, mild L/R lateral lean    TODAY'S TREATMENT:    Today's subjective:   Pt reported of chronic numbness in B thighs which has improved due to Gabapentin. L groin pain which started few weeks ago while getting out of car.  DATE: 07/25/23  TE: 15 mins Sci-Fit x 7 mins while discussing HEP and symptoms.  SLR to 6 inches AAROM 2 x 10 reps Bridges 1 x 10 reps Bridges with ball squeeze 1 x 60 secs  NM: 30 mins = 990 ft without rest DGI= 15/24= Predictive fall risk.        PATIENT EDUCATION:  Education details: HEP, POC, goals Person educated: Patient Education method: Explanation, Demonstration, and Handouts Education comprehension: verbalized understanding and returned demonstration  HOME EXERCISE PROGRAM: Access Code: [email or web address] URL: [email or web address] Date: 07/18/2023 Prepared by: Maylon Peppers  Exercises -  Standing Hip Abduction with Counter Support  - 2-3 x daily - 5-7 x weekly - 3 sets - 10 reps - Standing March with Counter Support  - 2-3 x daily - 5-7 x weekly - 3 sets - 10 reps - Heel Raises with Counter Support  - 2-3 x daily - 5-7 x weekly - 3 sets - 10 reps - Sit to Stand with Counter Support  - 2-3 x daily - 5-7 x weekly - 3 sets - 10 reps  ASSESSMENT:  CLINICAL IMPRESSION: Patient presented with unsteady gait and increased L hip pain. PT performed quick check of the L hip and found possible strain of hip flexor and adductor muscles. L hip jt PROM WFL with normal end feel and pain free. Pt has pain full AROM. Pain with palpation to Adductors and Upper 1/3 quads. PT completed and DGI test as per plan. Pt ambulated 960 ft in without rest with unsteady gait requiring pt to reach walls or balance. Pt DGI score 15/24 revealed pt is at risk of falls. Pt introduced to SLR AAROM to 6 inches from Mat, adductor activation and  bridges. Same for the HEP. Pt demonstrates good recall memory. Patient will benefit from skilled PT interventions to address listed impairments to improve overall mobility and reduce pain during ADLs and walking.   OBJECTIVE IMPAIRMENTS: Abnormal gait, cardiopulmonary status limiting activity, decreased activity tolerance, decreased balance, decreased endurance, decreased mobility, difficulty walking, decreased strength, improper body mechanics, postural dysfunction, and pain.   ACTIVITY LIMITATIONS: bending, standing, squatting, sleeping, stairs, transfers, and locomotion level  PARTICIPATION LIMITATIONS: cleaning, laundry, shopping, and community activity  PERSONAL FACTORS: Age, Behavior pattern, Fitness, and Past/current experiences are also affecting patient's functional outcome.   REHAB POTENTIAL: Good  CLINICAL DECISION MAKING: Stable/uncomplicated  EVALUATION COMPLEXITY: Low   GOALS: Goals reviewed with patient? Yes  SHORT TERM GOALS: Target date:  08/15/2023  Patient will be independent in HEP to improve strength/mobility for better functional independence with ADLs. Baseline: 8/29: HEP initiated Goal status: INITIAL  LONG TERM GOALS: Target date: 09/12/2023  Patient will increase FOTO score to equal to or greater than 48 to demonstrate statistically significant improvement in mobility and quality of life.  Baseline: 8/29: 31 Goal status: INITIAL  2.  Patient (> 78 years old) will complete five times sit to stand test in < 15 seconds indicating an increased LE strength and improved balance. Baseline: 8/29: 42.11 seconds Goal status: INITIAL  3.  Patient will increase six minute walk test distance by 200' for progression to community ambulator and improve gait ability. Baseline: 8/29: to be tested on 2nd visit Goal status: INITIAL  4.  Patient will increase BLE gross strength to 4+/5 as to improve functional strength for independent gait, increased standing tolerance and increased ADL ability. Baseline: 8/29: see above  Goal status: INITIAL  5.  Patient will increase Dynamic Gait  Index (DGI) score to >19/24 as to demonstrate reduced fall risk and improved dynamic gait balance for better safety with community/home ambulation.  Baseline: 8/29: to be tested on 2nd visit  Goal status: INITIAL   PLAN:  PT FREQUENCY: 1-2x/week  PT DURATION: 8 weeks  PLANNED INTERVENTIONS: Therapeutic exercises, Therapeutic activity, Neuromuscular re-education, Balance training, Gait training, Patient/Family education, Self Care, Joint mobilization, Stair training, DME instructions, Aquatic Therapy, Dry Needling, Spinal mobilization, Cryotherapy, Moist heat, and Manual therapy.  PLAN FOR NEXT SESSION: , DGI, LE strengthening/stretching, balance, GIVE SEATED PIRIFORMIS STRETCH   Janet Berlin PT DPT 4:06 PM,07/25/23

## 2023-07-29 DIAGNOSIS — S90121A Contusion of right lesser toe(s) without damage to nail, initial encounter: Secondary | ICD-10-CM | POA: Diagnosis not present

## 2023-07-29 DIAGNOSIS — M2011 Hallux valgus (acquired), right foot: Secondary | ICD-10-CM | POA: Diagnosis not present

## 2023-07-29 DIAGNOSIS — M19071 Primary osteoarthritis, right ankle and foot: Secondary | ICD-10-CM | POA: Diagnosis not present

## 2023-07-29 DIAGNOSIS — M79671 Pain in right foot: Secondary | ICD-10-CM | POA: Diagnosis not present

## 2023-07-29 DIAGNOSIS — E119 Type 2 diabetes mellitus without complications: Secondary | ICD-10-CM | POA: Diagnosis not present

## 2023-07-29 DIAGNOSIS — L97511 Non-pressure chronic ulcer of other part of right foot limited to breakdown of skin: Secondary | ICD-10-CM | POA: Diagnosis not present

## 2023-07-29 DIAGNOSIS — L03031 Cellulitis of right toe: Secondary | ICD-10-CM | POA: Diagnosis not present

## 2023-07-30 ENCOUNTER — Ambulatory Visit: Payer: Medicare HMO

## 2023-07-30 ENCOUNTER — Other Ambulatory Visit: Payer: Medicare HMO

## 2023-07-30 ENCOUNTER — Encounter: Payer: Self-pay | Admitting: Internal Medicine

## 2023-07-30 DIAGNOSIS — J301 Allergic rhinitis due to pollen: Secondary | ICD-10-CM | POA: Diagnosis not present

## 2023-07-30 DIAGNOSIS — R262 Difficulty in walking, not elsewhere classified: Secondary | ICD-10-CM

## 2023-07-30 DIAGNOSIS — M6281 Muscle weakness (generalized): Secondary | ICD-10-CM | POA: Diagnosis not present

## 2023-07-30 DIAGNOSIS — M5459 Other low back pain: Secondary | ICD-10-CM | POA: Diagnosis not present

## 2023-07-30 NOTE — Therapy (Signed)
OUTPATIENT PHYSICAL THERAPY THORACOLUMBAR/LOWER EXTREMITY EVALUATION   Patient Name: Erin Camacho MRN: [id number] DOB:1946/04/22, 77 y.o., female Today's Date: 07/30/2023  END OF SESSION:  PT End of Session - 07/30/23 1602     Visit Number 3    Number of Visits 17    Date for PT Re-Evaluation 09/12/23    PT Start Time 0230    PT Stop Time 0318    PT Time Calculation (min) 48 min    Activity Tolerance Patient tolerated treatment well    Behavior During Therapy [facility] for tasks assessed/performed               Past Medical History:  Diagnosis Date   Allergy    hay fever   Anxiety    Arthritis    Chronic back pain    Diabetes mellitus    Dyspnea    a. 07/2017 Echo: EF 60-65%. No rwma. Mildly dil LA. Nl RV fxn.   GERD (gastroesophageal reflux disease)    History of hiatal hernia    Hyperlipidemia    Hypertension    Osteoarthritis    Pneumonia    Spinal stenosis    Type 2 diabetes mellitus (HCC)    UTI (lower urinary tract infection)    Past Surgical History:  Procedure Laterality Date   ABDOMINAL HYSTERECTOMY  1973   ABDOMINAL HYSTERECTOMY     APPENDECTOMY     APPLICATION OF ROBOTIC ASSISTANCE FOR SPINAL PROCEDURE N/A 09/15/2018   Procedure: APPLICATION OF ROBOTIC ASSISTANCE FOR SPINAL PROCEDURE;  Surgeon: Barnett Abu, MD;  Location: [facility] OR;  Service: Neurosurgery;  Laterality: N/A;   BACK SURGERY  04/17/13, 2017, 2019   CATARACT EXTRACTION W/PHACO Left 12/16/2019   Procedure: CATARACT EXTRACTION PHACO AND INTRAOCULAR LENS PLACEMENT (IOC) LEFT PANOPTIX LENS DIABETIC 8.07 01:19.2 10.2%;  Surgeon: Lockie Mola, MD;  Location: [facility];  Service: Ophthalmology;  Laterality: Left;  Diabetic - oral meds   CATARACT EXTRACTION W/PHACO Right 01/06/2020   Procedure: CATARACT EXTRACTION PHACO AND INTRAOCULAR LENS PLACEMENT (IOC) PANOPTIX LENS RIGHT DIABETIC 10.73 01:08.6 15.7%;  Surgeon: Lockie Mola, MD;  Location: [facility];  Service:  Ophthalmology;  Laterality: Right;  diabetic   CHOLECYSTECTOMY  1995   COLONOSCOPY WITH PROPOFOL N/A 03/15/2021   Procedure: COLONOSCOPY WITH PROPOFOL;  Surgeon: Earline Mayotte, MD;  Location: [facility] END[facility];  Service: Endoscopy;  Laterality: N/A;   ESOPHAGOGASTRODUODENOSCOPY N/A 06/20/2022   Procedure: ESOPHAGOGASTRODUODENOSCOPY (EGD);  Surgeon: Toledo, Boykin Nearing, MD;  Location: [facility] END[facility];  Service: Gastroenterology;  Laterality: N/A;   EYE SURGERY  2021   bilateral cataract removal with lens implants   HERNIA REPAIR  08/2006   lap band  08/2003   LUMBAR LAMINECTOMY/ DECOMPRESSION WITH MET-RX Left 12/29/2020   Procedure: Left Thoracic Seven-Eight Microdiscectomy;  Surgeon: Barnett Abu, MD;  Location: [facility];  Service: Neurosurgery;  Laterality: Left;   SEPTOPLASTY  04/1978   TONSILLECTOMY AND ADENOIDECTOMY  1979   TOTAL HIP ARTHROPLASTY  02/2012   Right, Dr. Ernest Pine   TOTAL HIP ARTHROPLASTY Left 08/31/2019   Procedure: TOTAL HIP ARTHROPLASTY;  Surgeon: Donato Heinz, MD;  Location: [facility] ORS;  Service: Orthopedics;  Laterality: Left;   UPPER GI ENDOSCOPY  06/20/2022   Patient Active Problem List   Diagnosis Date Noted   Trigger finger 07/03/2023   Leg pain 11/29/2022   Right leg numbness 08/20/2022   Memory change 08/17/2022   Bilateral carotid artery stenosis 01/31/2022   Balance problem 01/31/2022   History of  COVID-19 11/25/2021   Fall 05/22/2021   Herniated nucleus pulposus with myelopathy, thoracic 12/29/2020   Low back pain 09/15/2020   Opioid use (10 MME/day) 05/16/2020   Hypokalemia 04/28/2020   Chronic pain syndrome 04/20/2020   Disorder of skeletal system 04/20/2020   Chronic thoracic spine pain (Bilateral) (R>L) 04/20/2020   Myofascial pain syndrome of thoracic spine 04/20/2020   Chronic musculoskeletal pain 04/20/2020   Spasm of thoracic back muscle 04/20/2020   Weight loss 12/31/2019   History of total hip replacement (Left) 08/31/2019   Early satiety  07/05/2019   Lumbar stenosis with neurogenic claudication 09/15/2018   Shortness of breath 06/19/2018   Cough 04/13/2018   Aortic atherosclerosis (HCC) 01/11/2018   GERD (gastroesophageal reflux disease) 12/23/2016   Atrophic vaginitis 03/18/2016   Spondylolisthesis at L5-S1 level 12/06/2015   Rhinitis, allergic 11/22/2015   Right leg pain 07/27/2014   Neuritis or radiculitis due to rupture of lumbar intervertebral disc 06/28/2014   Chronic shoulder pain (Bilateral) 02/24/2014   History of bariatric surgery 01/18/2014   Status post bariatric surgery 01/18/2014   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   Depression, major, single episode, mild (HCC) 05/14/2013   Major depressive disorder, single episode 05/14/2013   Healthcare maintenance 01/07/2013   Hypercholesterolemia 08/22/2012   Hyperlipidemia 08/22/2012   Hypertension 07/07/2012   Diabetes mellitus type 2, diet-controlled (HCC) 07/07/2012   Insomnia 07/07/2012   Obesity (BMI 30-39.9) 07/07/2012   Essential (primary) hypertension 07/07/2012   Chronic low back pain (1ry area of Pain) (Bilateral) (R>L) w/o sciatica 10/18/2011    PCP: Dale Garden, MD  REFERRING PROVIDER: Dale [place], MD  REFERRING DIAG: M79.604,M79.605 (ICD-10-CM) - Pain in both lower extremities  Rationale for Evaluation and Treatment: Rehabilitation  THERAPY DIAG:  Difficulty in walking, not elsewhere classified  Other low back pain  ONSET DATE: after 2013 (after R hip replacement)  SUBJECTIVE:                                                                                                                                                                                           SUBJECTIVE STATEMENT: Patient reports BLE weakness and numbness. Numbness is constant on anterolateral thighs but gets worse with standing >10 minutes. Patient unable to ambulate her block around her home. Difficulty with stairs (step to pattern). Patient has had  4 back surgeries - L1-S1 fusion and "rods" T8/9-L1 starting in 2014. Has had L and R hip replacements.   PERTINENT HISTORY:  Patient has been seeing Dr. Danielle Dess for her back with radicular pain. Has had a fall 4-5 months ago and hurt her L knee. Per Dr.  Scott's note from 07/05/23, patient reports weakness in upper thighs and limited in walking due to her legs. Dr. Lorin Picket discussed with patient about utilizing a walker to help with patient reporting she does better in stores with a cart she can lean on.   PAIN:  Are you having pain? Yes: NPRS scale: 4-5/10 at worst  Pain location: B legs  Pain description: restless Aggravating factors: laying down, standing for prolonged periods of time  Relieving factors: movement, medication   PRECAUTIONS: Fall  WEIGHT BEARING RESTRICTIONS: No  FALLS:  Has patient fallen in last 6 months? Yes. Number of falls 1  LIVING ENVIRONMENT:   Lives with: lives with their spouse Lives in: House/apartment Stairs: No Has following equipment at home: Single point cane and Environmental consultant - 4 wheeled  OCCUPATION: retired   PLOF: Independent  PATIENT GOALS: to be able to walk a distance and not hurt   NEXT MD VISIT: 09/2023 (PCP)  OBJECTIVE:   DIAGNOSTIC FINDINGS:  N/A - unable to obtain due to outside provider   PATIENT SURVEYS:  FOTO 31 with goal of 48  SCREENING FOR RED FLAGS: Bowel or bladder incontinence: Yes: at times, patient reports unaware of bowel movement until trip to the restroom.  No issues with bladder incontinence.  Spinal tumors: No Cauda equina syndrome: No Compression fracture: No Abdominal aneurysm: No  COGNITION: Overall cognitive status: Within functional limits for tasks assessed     SENSATION: Sensory testing WFL, however patient reports numbness on anterolateral parts of B thighs  POSTURE: rounded shoulders, forward head, and increased thoracic kyphosis  PALPATION: Nontender to lumbar and thoracic paraspinals  Notable  piriformis tightness bilaterally with FADIR  LUMBAR ROM:   AROM eval  Flexion 80%  Extension 50%  Right lateral flexion To mid thigh*  Left lateral flexion To mid thigh*  Right rotation 50% *  Left rotation 50% *   (Blank rows = not tested)   *=pain   LOWER EXTREMITY MMT:    MMT Right eval Left eval  Hip flexion 4- 4-  Hip extension    Hip abduction 4- 4-  Hip adduction 4- 4-  Hip internal rotation    Hip external rotation    Knee flexion 4 4  Knee extension 4 4  Ankle dorsiflexion 4- 4-   (Blank rows = not tested)  LUMBAR SPECIAL TESTS:  Straight leg raise test: Negative and FABER test: Positive; FADIR; positive on B LE   FUNCTIONAL TESTS:  5 times sit to stand: 42.11 sec 6 minute walk test: NT on eval Dynamic Gait Index: NT on eval   GAIT: Distance walked: 50' Assistive device utilized: None Level of assistance: Modified independence Comments: decreased stance time bilaterally, mild L/R lateral lean    TODAY'S TREATMENT:    Today's subjective:   Pt continues to c/o of chronic numbness in B thighs which has improved due to Gabapentin. However it is more felt with touch. L groin pain which started few weeks ago while getting out of car.  DATE: 07/30/23  TE: 40 mins Sci-Fit x 7 mins level 3 while discussing HEP and symptoms.  Supine:  while core engaged Pelvic tilts 2 x 10 reps Core isometrics core engaged TKE 4 x 30 secs Core engaged Heel slides 3 x 30 secs Quad and psoas stretch 2 x 60 secs hold SLR to 6 inches AAROM 2 x secs Bridges 1 x 10 reps Bridges with ball squeeze 1 x 60 secs STS with ball squeeze 3 z 30 secs  TA: 8 mins Pt eduction about core activation and its significance, her condition and rehab progression.    Not today: NM: 30 mins = 990 ft without rest DGI= 15/24= Predictive fall risk.        PATIENT  EDUCATION:  Education details: HEP, POC, goals Person educated: Patient Education method: Explanation, Demonstration, and Handouts Education comprehension: verbalized understanding and returned demonstration  HOME EXERCISE PROGRAM: Access Code: [email or web address] URL: [email or web address] Date: 07/18/2023 Prepared by: Maylon Peppers  Exercises - Standing Hip Abduction with Counter Support  - 2-3 x daily - 5-7 x weekly - 3 sets - 10 reps - Standing March with Counter Support  - 2-3 x daily - 5-7 x weekly - 3 sets - 10 reps - Heel Raises with Counter Support  - 2-3 x daily - 5-7 x weekly - 3 sets - 10 reps - Sit to Stand with Counter Support  - 2-3 x daily - 5-7 x weekly - 3 sets - 10 reps  ASSESSMENT:  CLINICAL IMPRESSION: Patient presented with numbness which diminished to 0/10.  Pt demonstrates poor postural muscle strength and intrinsic paraspinal weakness. Pt introduced to pelvic tilts and lumbo-pelvic stabilization exs, LLE strengthening and functional exs. Pt demonstrated good understanding of the core activation through out the day in order to either reduce pain/numness  or become asymptomatic as achieved today in the session. Pt provided hand written HEP same as today session. Ptol treatment well evident by decreased symptoms. Pt will benefit from skilled PT interventions to address listed impairments to improve overall mobility and reduce pain during ADLs and walking.   OBJECTIVE IMPAIRMENTS: Abnormal gait, cardiopulmonary status limiting activity, decreased activity tolerance, decreased balance, decreased endurance, decreased mobility, difficulty walking, decreased strength, improper body mechanics, postural dysfunction, and pain.   ACTIVITY LIMITATIONS: bending, standing, squatting, sleeping, stairs, transfers, and locomotion level  PARTICIPATION LIMITATIONS: cleaning, laundry, shopping, and community activity  PERSONAL FACTORS: Age, Behavior pattern, Fitness, and  Past/current experiences are also affecting patient's functional outcome.   REHAB POTENTIAL: Good  CLINICAL DECISION MAKING: Stable/uncomplicated  EVALUATION COMPLEXITY: Low   GOALS: Goals reviewed with patient? Yes  SHORT TERM GOALS: Target date: 08/15/2023  Patient will be independent in HEP to improve strength/mobility for better functional independence with ADLs. Baseline: 8/29: HEP initiated Goal status: INITIAL  LONG TERM GOALS: Target date: 09/12/2023  Patient will increase FOTO score to equal to or greater than 48 to demonstrate statistically significant improvement in mobility and quality of life.  Baseline: 8/29: 31 Goal status: INITIAL  2.  Patient (> 17 years old) will complete five times sit to stand test in < 15 seconds indicating an increased LE strength and improved balance. Baseline: 8/29: 42.11 seconds Goal status: INITIAL  3.  Patient will increase six minute walk test distance by 200' for progression to community ambulator and improve gait ability. Baseline: 8/29: to be tested on 2nd visit Goal status: INITIAL  4.  Patient will increase BLE gross strength to 4+/5 as to improve  functional strength for independent gait, increased standing tolerance and increased ADL ability. Baseline: 8/29: see above  Goal status: INITIAL  5.  Patient will increase Dynamic Gait Index (DGI) score to >19/24 as to demonstrate reduced fall risk and improved dynamic gait balance for better safety with community/home ambulation.  Baseline: 8/29: to be tested on 2nd visit  Goal status: INITIAL   PLAN:  PT FREQUENCY: 1-2x/week  PT DURATION: 8 weeks  PLANNED INTERVENTIONS: Therapeutic exercises, Therapeutic activity, Neuromuscular re-education, Balance training, Gait training, Patient/Family education, Self Care, Joint mobilization, Stair training, DME instructions, Aquatic Therapy, Dry Needling, Spinal mobilization, Cryotherapy, Moist heat, and Manual therapy.  PLAN FOR NEXT  SESSION: , DGI, LE strengthening/stretching, balance, GIVE SEATED PIRIFORMIS STRETCH  Janet Berlin PT DPT 4:05 PM,07/30/23

## 2023-07-31 MED ORDER — SEMAGLUTIDE(0.25 OR 0.5MG/DOS) 2 MG/3ML ~~LOC~~ SOPN: 0.5000 mg | PEN_INJECTOR | SUBCUTANEOUS | 1 refills | Status: DC

## 2023-07-31 NOTE — Telephone Encounter (Signed)
Ok to increase her to 0.5mg ?

## 2023-07-31 NOTE — Telephone Encounter (Signed)
I have sent in the prescription for .5mg  ozempic.  Let us know if any problems.

## 2023-08-01 ENCOUNTER — Ambulatory Visit: Payer: Medicare HMO

## 2023-08-01 DIAGNOSIS — M6281 Muscle weakness (generalized): Secondary | ICD-10-CM | POA: Diagnosis not present

## 2023-08-01 DIAGNOSIS — M5459 Other low back pain: Secondary | ICD-10-CM

## 2023-08-01 DIAGNOSIS — R262 Difficulty in walking, not elsewhere classified: Secondary | ICD-10-CM

## 2023-08-01 NOTE — Therapy (Signed)
OUTPATIENT PHYSICAL THERAPY THORACOLUMBAR/LOWER EXTREMITY EVALUATION   Patient Name: Erin Camacho MRN: [id number] DOB:December 22, 1945, 77 y.o., female Today's Date: 08/01/2023  END OF SESSION:  PT End of Session - 08/01/23 1703     Visit Number 4    Number of Visits 17    Date for PT Re-Evaluation 09/12/23    PT Start Time 1440    PT Stop Time 1520    PT Time Calculation (min) 40 min    Activity Tolerance Patient tolerated treatment well    Behavior During Therapy [facility] for tasks assessed/performed                Past Medical History:  Diagnosis Date   Allergy    hay fever   Anxiety    Arthritis    Chronic back pain    Diabetes mellitus    Dyspnea    a. 07/2017 Echo: EF 60-65%. No rwma. Mildly dil LA. Nl RV fxn.   GERD (gastroesophageal reflux disease)    History of hiatal hernia    Hyperlipidemia    Hypertension    Osteoarthritis    Pneumonia    Spinal stenosis    Type 2 diabetes mellitus (HCC)    UTI (lower urinary tract infection)    Past Surgical History:  Procedure Laterality Date   ABDOMINAL HYSTERECTOMY  1973   ABDOMINAL HYSTERECTOMY     APPENDECTOMY     APPLICATION OF ROBOTIC ASSISTANCE FOR SPINAL PROCEDURE N/A 09/15/2018   Procedure: APPLICATION OF ROBOTIC ASSISTANCE FOR SPINAL PROCEDURE;  Surgeon: Barnett Abu, MD;  Location: [facility] OR;  Service: Neurosurgery;  Laterality: N/A;   BACK SURGERY  04/17/13, 2017, 2019   CATARACT EXTRACTION W/PHACO Left 12/16/2019   Procedure: CATARACT EXTRACTION PHACO AND INTRAOCULAR LENS PLACEMENT (IOC) LEFT PANOPTIX LENS DIABETIC 8.07 01:19.2 10.2%;  Surgeon: Lockie Mola, MD;  Location: [facility];  Service: Ophthalmology;  Laterality: Left;  Diabetic - oral meds   CATARACT EXTRACTION W/PHACO Right 01/06/2020   Procedure: CATARACT EXTRACTION PHACO AND INTRAOCULAR LENS PLACEMENT (IOC) PANOPTIX LENS RIGHT DIABETIC 10.73 01:08.6 15.7%;  Surgeon: Lockie Mola, MD;  Location: [facility];  Service:  Ophthalmology;  Laterality: Right;  diabetic   CHOLECYSTECTOMY  1995   COLONOSCOPY WITH PROPOFOL N/A 03/15/2021   Procedure: COLONOSCOPY WITH PROPOFOL;  Surgeon: Earline Mayotte, MD;  Location: [facility] END[facility];  Service: Endoscopy;  Laterality: N/A;   ESOPHAGOGASTRODUODENOSCOPY N/A 06/20/2022   Procedure: ESOPHAGOGASTRODUODENOSCOPY (EGD);  Surgeon: Toledo, Boykin Nearing, MD;  Location: [facility] END[facility];  Service: Gastroenterology;  Laterality: N/A;   EYE SURGERY  2021   bilateral cataract removal with lens implants   HERNIA REPAIR  08/2006   lap band  08/2003   LUMBAR LAMINECTOMY/ DECOMPRESSION WITH MET-RX Left 12/29/2020   Procedure: Left Thoracic Seven-Eight Microdiscectomy;  Surgeon: Barnett Abu, MD;  Location: [facility] OR;  Service: Neurosurgery;  Laterality: Left;   SEPTOPLASTY  04/1978   TONSILLECTOMY AND ADENOIDECTOMY  1979   TOTAL HIP ARTHROPLASTY  02/2012   Right, Dr. Ernest Pine   TOTAL HIP ARTHROPLASTY Left 08/31/2019   Procedure: TOTAL HIP ARTHROPLASTY;  Surgeon: Donato Heinz, MD;  Location: [facility] ORS;  Service: Orthopedics;  Laterality: Left;   UPPER GI ENDOSCOPY  06/20/2022   Patient Active Problem List   Diagnosis Date Noted   Trigger finger 07/03/2023   Leg pain 11/29/2022   Right leg numbness 08/20/2022   Memory change 08/17/2022   Bilateral carotid artery stenosis 01/31/2022   Balance problem 01/31/2022   History  of COVID-19 11/25/2021   Fall 05/22/2021   Herniated nucleus pulposus with myelopathy, thoracic 12/29/2020   Low back pain 09/15/2020   Opioid use (10 MME/day) 05/16/2020   Hypokalemia 04/28/2020   Chronic pain syndrome 04/20/2020   Disorder of skeletal system 04/20/2020   Chronic thoracic spine pain (Bilateral) (R>L) 04/20/2020   Myofascial pain syndrome of thoracic spine 04/20/2020   Chronic musculoskeletal pain 04/20/2020   Spasm of thoracic back muscle 04/20/2020   Weight loss 12/31/2019   History of total hip replacement (Left) 08/31/2019   Early satiety  07/05/2019   Lumbar stenosis with neurogenic claudication 09/15/2018   Shortness of breath 06/19/2018   Cough 04/13/2018   Aortic atherosclerosis (HCC) 01/11/2018   GERD (gastroesophageal reflux disease) 12/23/2016   Atrophic vaginitis 03/18/2016   Spondylolisthesis at L5-S1 level 12/06/2015   Rhinitis, allergic 11/22/2015   Right leg pain 07/27/2014   Neuritis or radiculitis due to rupture of lumbar intervertebral disc 06/28/2014   Chronic shoulder pain (Bilateral) 02/24/2014   History of bariatric surgery 01/18/2014   Status post bariatric surgery 01/18/2014   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   Depression, major, single episode, mild (HCC) 05/14/2013   Major depressive disorder, single episode 05/14/2013   Healthcare maintenance 01/07/2013   Hypercholesterolemia 08/22/2012   Hyperlipidemia 08/22/2012   Hypertension 07/07/2012   Diabetes mellitus type 2, diet-controlled (HCC) 07/07/2012   Insomnia 07/07/2012   Obesity (BMI 30-39.9) 07/07/2012   Essential (primary) hypertension 07/07/2012   Chronic low back pain (1ry area of Pain) (Bilateral) (R>L) w/o sciatica 10/18/2011    PCP: Dale Mount Vernon, MD  REFERRING PROVIDER: Dale Sturgis, MD  REFERRING DIAG: M79.604,M79.605 (ICD-10-CM) - Pain in both lower extremities  Rationale for Evaluation and Treatment: Rehabilitation  THERAPY DIAG:  Difficulty in walking, not elsewhere classified  Muscle weakness (generalized)  Other low back pain  ONSET DATE: after 2013 (after R hip replacement)  SUBJECTIVE:                                                                                                                                                                                           SUBJECTIVE STATEMENT: Patient reports BLE weakness and numbness. Numbness is constant on anterolateral thighs but gets worse with standing >10 minutes. Patient unable to ambulate her block around her home. Difficulty with stairs  (step to pattern). Patient has had 4 back surgeries - L1-S1 fusion and "rods" T8/9-L1 starting in 2014. Has had L and R hip replacements.   PERTINENT HISTORY:  Patient has been seeing Dr. Danielle Dess for her back with radicular pain. Has had a fall 4-5 months ago and hurt  her L knee. Per Dr. Roby Lofts note from 07/05/23, patient reports weakness in upper thighs and limited in walking due to her legs. Dr. Lorin Picket discussed with patient about utilizing a walker to help with patient reporting she does better in stores with a cart she can lean on.   PAIN:  Are you having pain? Yes: NPRS scale: 4-5/10 at worst  Pain location: B legs  Pain description: restless Aggravating factors: laying down, standing for prolonged periods of time  Relieving factors: movement, medication   PRECAUTIONS: Fall  WEIGHT BEARING RESTRICTIONS: No  FALLS:  Has patient fallen in last 6 months? Yes. Number of falls 1  LIVING ENVIRONMENT:   Lives with: lives with their spouse Lives in: House/apartment Stairs: No Has following equipment at home: Single point cane and Environmental consultant - 4 wheeled  OCCUPATION: retired   PLOF: Independent  PATIENT GOALS: to be able to walk a distance and not hurt   NEXT MD VISIT: 09/2023 (PCP)  OBJECTIVE:   DIAGNOSTIC FINDINGS:  N/A - unable to obtain due to outside provider   PATIENT SURVEYS:  FOTO 31 with goal of 48  SCREENING FOR RED FLAGS: Bowel or bladder incontinence: Yes: at times, patient reports unaware of bowel movement until trip to the restroom.  No issues with bladder incontinence.  Spinal tumors: No Cauda equina syndrome: No Compression fracture: No Abdominal aneurysm: No  COGNITION: Overall cognitive status: Within functional limits for tasks assessed     SENSATION: Sensory testing WFL, however patient reports numbness on anterolateral parts of B thighs  POSTURE: rounded shoulders, forward head, and increased thoracic kyphosis  PALPATION: Nontender to lumbar and  thoracic paraspinals  Notable piriformis tightness bilaterally with FADIR  LUMBAR ROM:   AROM eval  Flexion 80%  Extension 50%  Right lateral flexion To mid thigh*  Left lateral flexion To mid thigh*  Right rotation 50% *  Left rotation 50% *   (Blank rows = not tested)   *=pain   LOWER EXTREMITY MMT:    MMT Right eval Left eval  Hip flexion 4- 4-  Hip extension    Hip abduction 4- 4-  Hip adduction 4- 4-  Hip internal rotation    Hip external rotation    Knee flexion 4 4  Knee extension 4 4  Ankle dorsiflexion 4- 4-   (Blank rows = not tested)  LUMBAR SPECIAL TESTS:  Straight leg raise test: Negative and FABER test: Positive; FADIR; positive on B LE   FUNCTIONAL TESTS:  5 times sit to stand: 42.11 sec 6 minute walk test: NT on eval Dynamic Gait Index: NT on eval   GAIT: Distance walked: 50' Assistive device utilized: None Level of assistance: Modified independence Comments: decreased stance time bilaterally, mild L/R lateral lean    TODAY'S TREATMENT:    Today's subjective:   Pt continues to c/o of chronic numbness in B thighs which has improved due to Gabapentin. However it is more felt with touch. L groin pain which started few weeks ago while getting out of car.  DATE: 08/01/23  TE: 40 mins Sci-Fit x 7 mins level 3 while discussing HEP and symptoms.  Supine:  while core engaged Pelvic tilts 2 x 10 reps Core isometrics core engaged TKE 4 x 30 secs Core engaged Heel slides 3 x 30 secs Quad and psoas stretch 2 x 60 secs hold SLR to 6 inches AAROM 2 x secs Bridges 1 x 10 reps Bridges with ball squeeze, abd /add manual resistance 2 x 60 secs Squats with ball squeeze 2 x 60 secs Monster walking with YTB on ladder x 4.    Not today Pt eduction about core activation and its significance, her condition and rehab progression.  NM: 30  mins = 990 ft without rest DGI= 15/24= Predictive fall risk.        PATIENT EDUCATION:  Education details: HEP, POC, goals Person educated: Patient Education method: Explanation, Demonstration, and Handouts Education comprehension: verbalized understanding and returned demonstration  HOME EXERCISE PROGRAM: Access Code: [email or web address] URL: [email or web address] Date: 07/18/2023 Prepared by: Maylon Peppers  Exercises - Standing Hip Abduction with Counter Support  - 2-3 x daily - 5-7 x weekly - 3 sets - 10 reps - Standing March with Counter Support  - 2-3 x daily - 5-7 x weekly - 3 sets - 10 reps - Heel Raises with Counter Support  - 2-3 x daily - 5-7 x weekly - 3 sets - 10 reps - Sit to Stand with Counter Support  - 2-3 x daily - 5-7 x weekly - 3 sets - 10 reps  ASSESSMENT:  CLINICAL IMPRESSION: Patient presented with numbness which diminished to 0/10.  Pt continues to demonstrates poor postural muscle strength and intrinsic paraspinal weakness. Pt able to raise LLE ~ 3 to 4 inches actively with continued groin pain. PT performed scour test which is negative. Pt SI oint compression and distraction test negative. Pt's Psoas muscle strain is a strong possibilities. Pt introduced to pelvic tilts and lumbo-pelvic stabilization exs, LLE strengthening and functional exs. Pt demonstrated good understanding of the core activation through out the day in order to either reduce pain/numness  or become asymptomatic as achieved today in the session. Pt provided hand written HEP same as today session. Ptol treatment well evident by decreased symptoms. Pt will benefit from skilled PT interventions to address listed impairments to improve overall mobility and reduce pain during ADLs and walking.   OBJECTIVE IMPAIRMENTS: Abnormal gait, cardiopulmonary status limiting activity, decreased activity tolerance, decreased balance, decreased endurance, decreased mobility, difficulty walking,  decreased strength, improper body mechanics, postural dysfunction, and pain.   ACTIVITY LIMITATIONS: bending, standing, squatting, sleeping, stairs, transfers, and locomotion level  PARTICIPATION LIMITATIONS: cleaning, laundry, shopping, and community activity  PERSONAL FACTORS: Age, Behavior pattern, Fitness, and Past/current experiences are also affecting patient's functional outcome.   REHAB POTENTIAL: Good  CLINICAL DECISION MAKING: Stable/uncomplicated  EVALUATION COMPLEXITY: Low   GOALS: Goals reviewed with patient? Yes  SHORT TERM GOALS: Target date: 08/15/2023  Patient will be independent in HEP to improve strength/mobility for better functional independence with ADLs. Baseline: 8/29: HEP initiated Goal status: INITIAL  LONG TERM GOALS: Target date: 09/12/2023  Patient will increase FOTO score to equal to or greater than 48 to demonstrate statistically significant improvement in mobility and quality of life.  Baseline: 8/29: 31 Goal status: INITIAL  2.  Patient (> 12 years old) will complete five times sit to stand test in < 15 seconds indicating an increased LE strength and improved balance. Baseline: 8/29: 42.11 seconds Goal  status: INITIAL  3.  Patient will increase six minute walk test distance by 200' for progression to community ambulator and improve gait ability. Baseline: 8/29: to be tested on 2nd visit Goal status: INITIAL  4.  Patient will increase BLE gross strength to 4+/5 as to improve functional strength for independent gait, increased standing tolerance and increased ADL ability. Baseline: 8/29: see above  Goal status: INITIAL  5.  Patient will increase Dynamic Gait Index (DGI) score to >19/24 as to demonstrate reduced fall risk and improved dynamic gait balance for better safety with community/home ambulation.  Baseline: 8/29: to be tested on 2nd visit  Goal status: INITIAL   PLAN:  PT FREQUENCY: 1-2x/week  PT DURATION: 8 weeks  PLANNED  INTERVENTIONS: Therapeutic exercises, Therapeutic activity, Neuromuscular re-education, Balance training, Gait training, Patient/Family education, Self Care, Joint mobilization, Stair training, DME instructions, Aquatic Therapy, Dry Needling, Spinal mobilization, Cryotherapy, Moist heat, and Manual therapy.  PLAN FOR NEXT SESSION: , DGI, LE strengthening/stretching, balance, GIVE SEATED PIRIFORMIS STRETCH  Janet Berlin PT DPT 5:05 PM,08/01/23

## 2023-08-02 ENCOUNTER — Encounter: Payer: Medicare HMO | Admitting: Internal Medicine

## 2023-08-04 ENCOUNTER — Other Ambulatory Visit: Payer: Self-pay | Admitting: Internal Medicine

## 2023-08-06 DIAGNOSIS — J301 Allergic rhinitis due to pollen: Secondary | ICD-10-CM | POA: Diagnosis not present

## 2023-08-07 ENCOUNTER — Encounter: Payer: Self-pay | Admitting: Physical Therapy

## 2023-08-07 ENCOUNTER — Ambulatory Visit: Payer: Medicare HMO | Admitting: Physical Therapy

## 2023-08-07 DIAGNOSIS — R262 Difficulty in walking, not elsewhere classified: Secondary | ICD-10-CM

## 2023-08-07 DIAGNOSIS — M6281 Muscle weakness (generalized): Secondary | ICD-10-CM

## 2023-08-07 DIAGNOSIS — M5459 Other low back pain: Secondary | ICD-10-CM | POA: Diagnosis not present

## 2023-08-07 NOTE — Therapy (Signed)
OUTPATIENT PHYSICAL THERAPY THORACOLUMBAR/LOWER EXTREMITY TREATMENT   Patient Name: Erin Camacho MRN: [id number] DOB:[birth year], 77 y.o., female Today's Date: 08/07/2023  END OF SESSION:  PT End of Session - 08/07/23 1116     Visit Number 5    Number of Visits 17    Date for PT Re-Evaluation 09/12/23    PT Start Time 1113    PT Stop Time 1158    PT Time Calculation (min) 45 min    Activity Tolerance Patient tolerated treatment well    Behavior During Therapy [facility] for tasks assessed/performed             Past Medical History:  Diagnosis Date   Allergy    hay fever   Anxiety    Arthritis    Chronic back pain    Diabetes mellitus    Dyspnea    a. 07/2017 Echo: EF 60-65%. No rwma. Mildly dil LA. Nl RV fxn.   GERD (gastroesophageal reflux disease)    History of hiatal hernia    Hyperlipidemia    Hypertension    Osteoarthritis    Pneumonia    Spinal stenosis    Type 2 diabetes mellitus (HCC)    UTI (lower urinary tract infection)    Past Surgical History:  Procedure Laterality Date   ABDOMINAL HYSTERECTOMY  1973   ABDOMINAL HYSTERECTOMY     APPENDECTOMY     APPLICATION OF ROBOTIC ASSISTANCE FOR SPINAL PROCEDURE N/A 09/15/2018   Procedure: APPLICATION OF ROBOTIC ASSISTANCE FOR SPINAL PROCEDURE;  Surgeon: Barnett Abu, MD;  Location: [facility] OR;  Service: Neurosurgery;  Laterality: N/A;   BACK SURGERY  04/17/13, 2017, 2019   CATARACT EXTRACTION W/PHACO Left 12/16/2019   Procedure: CATARACT EXTRACTION PHACO AND INTRAOCULAR LENS PLACEMENT (IOC) LEFT PANOPTIX LENS DIABETIC 8.07 01:19.2 10.2%;  Surgeon: Lockie Mola, MD;  Location: [facility];  Service: Ophthalmology;  Laterality: Left;  Diabetic - oral meds   CATARACT EXTRACTION W/PHACO Right 01/06/2020   Procedure: CATARACT EXTRACTION PHACO AND INTRAOCULAR LENS PLACEMENT (IOC) PANOPTIX LENS RIGHT DIABETIC 10.73 01:08.6 15.7%;  Surgeon: Lockie Mola, MD;  Location: [facility];  Service:  Ophthalmology;  Laterality: Right;  diabetic   CHOLECYSTECTOMY  1995   COLONOSCOPY WITH PROPOFOL N/A 03/15/2021   Procedure: COLONOSCOPY WITH PROPOFOL;  Surgeon: Earline Mayotte, MD;  Location: [facility];  Service: Endoscopy;  Laterality: N/A;   ESOPHAGOGASTRODUODENOSCOPY N/A 06/20/2022   Procedure: ESOPHAGOGASTRODUODENOSCOPY (EGD);  Surgeon: Toledo, Boykin Nearing, MD;  Location: [facility];  Service: Gastroenterology;  Laterality: N/A;   EYE SURGERY  2021   bilateral cataract removal with lens implants   HERNIA REPAIR  08/2006   lap band  08/2003   LUMBAR LAMINECTOMY/ DECOMPRESSION WITH MET-RX Left 12/29/2020   Procedure: Left Thoracic Seven-Eight Microdiscectomy;  Surgeon: Barnett Abu, MD;  Location: [facility];  Service: Neurosurgery;  Laterality: Left;   SEPTOPLASTY  04/1978   TONSILLECTOMY AND ADENOIDECTOMY  1979   TOTAL HIP ARTHROPLASTY  02/2012   Right, Dr. Ernest Pine   TOTAL HIP ARTHROPLASTY Left 08/31/2019   Procedure: TOTAL HIP ARTHROPLASTY;  Surgeon: Donato Heinz, MD;  Location: [facility] ORS;  Service: Orthopedics;  Laterality: Left;   UPPER GI ENDOSCOPY  06/20/2022   Patient Active Problem List   Diagnosis Date Noted   Trigger finger 07/03/2023   Leg pain 11/29/2022   Right leg numbness 08/20/2022   Memory change 08/17/2022   Bilateral carotid artery stenosis 01/31/2022   Balance problem 01/31/2022   History of COVID-19 11/25/2021  Fall 05/22/2021   Herniated nucleus pulposus with myelopathy, thoracic 12/29/2020   Low back pain 09/15/2020   Opioid use (10 MME/day) 05/16/2020   Hypokalemia 04/28/2020   Chronic pain syndrome 04/20/2020   Disorder of skeletal system 04/20/2020   Chronic thoracic spine pain (Bilateral) (R>L) 04/20/2020   Myofascial pain syndrome of thoracic spine 04/20/2020   Chronic musculoskeletal pain 04/20/2020   Spasm of thoracic back muscle 04/20/2020   Weight loss 12/31/2019   History of total hip replacement (Left) 08/31/2019   Early satiety  07/05/2019   Lumbar stenosis with neurogenic claudication 09/15/2018   Shortness of breath 06/19/2018   Cough 04/13/2018   Aortic atherosclerosis (HCC) 01/11/2018   GERD (gastroesophageal reflux disease) 12/23/2016   Atrophic vaginitis 03/18/2016   Spondylolisthesis at L5-S1 level 12/06/2015   Rhinitis, allergic 11/22/2015   Right leg pain 07/27/2014   Neuritis or radiculitis due to rupture of lumbar intervertebral disc 06/28/2014   Chronic shoulder pain (Bilateral) 02/24/2014   History of bariatric surgery 01/18/2014   Status post bariatric surgery 01/18/2014   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   Depression, major, single episode, mild (HCC) 05/14/2013   Major depressive disorder, single episode 05/14/2013   Healthcare maintenance 01/07/2013   Hypercholesterolemia 08/22/2012   Hyperlipidemia 08/22/2012   Hypertension 07/07/2012   Diabetes mellitus type 2, diet-controlled (HCC) 07/07/2012   Insomnia 07/07/2012   Obesity (BMI 30-39.9) 07/07/2012   Essential (primary) hypertension 07/07/2012   Chronic low back pain (1ry area of Pain) (Bilateral) (R>L) w/o sciatica 10/18/2011    PCP: Dale Niles, MD  REFERRING PROVIDER: Dale Cache, MD  REFERRING DIAG: M79.604,M79.605 (ICD-10-CM) - Pain in both lower extremities  Rationale for Evaluation and Treatment: Rehabilitation  THERAPY DIAG:  Difficulty in walking, not elsewhere classified  Muscle weakness (generalized)  Other low back pain  ONSET DATE: after 2013 (after R hip replacement)  SUBJECTIVE:                                                                                                                                                                                           SUBJECTIVE STATEMENT: Patient reports BLE weakness and numbness. Numbness is constant on anterolateral thighs but gets worse with standing >10 minutes. Patient unable to ambulate her block around her home. Difficulty with stairs  (step to pattern). Patient has had 4 back surgeries - L1-S1 fusion and "rods" T8/9-L1 starting in 2014. Has had L and R hip replacements.   PERTINENT HISTORY:  Patient has been seeing Dr. Danielle Dess for her back with radicular pain. Has had a fall 4-5 months ago and hurt her L knee. Per Dr.  Scott's note from 07/05/23, patient reports weakness in upper thighs and limited in walking due to her legs. Dr. Lorin Picket discussed with patient about utilizing a walker to help with patient reporting she does better in stores with a cart she can lean on.   PAIN:  Are you having pain? Yes: NPRS scale: 4-5/10 at worst  Pain location: B legs  Pain description: restless Aggravating factors: laying down, standing for prolonged periods of time  Relieving factors: movement, medication   PRECAUTIONS: Fall  WEIGHT BEARING RESTRICTIONS: No  FALLS:  Has patient fallen in last 6 months? Yes. Number of falls 1  LIVING ENVIRONMENT:   Lives with: lives with their spouse Lives in: House/apartment Stairs: No Has following equipment at home: Single point cane and Environmental consultant - 4 wheeled  OCCUPATION: retired   PLOF: Independent  PATIENT GOALS: to be able to walk a distance and not hurt   NEXT MD VISIT: 09/2023 (PCP)  OBJECTIVE:   DIAGNOSTIC FINDINGS:  N/A - unable to obtain due to outside provider   PATIENT SURVEYS:  FOTO 31 with goal of 48  SCREENING FOR RED FLAGS: Bowel or bladder incontinence: Yes: at times, patient reports unaware of bowel movement until trip to the restroom.  No issues with bladder incontinence.  Spinal tumors: No Cauda equina syndrome: No Compression fracture: No Abdominal aneurysm: No  COGNITION: Overall cognitive status: Within functional limits for tasks assessed     SENSATION: Sensory testing WFL, however patient reports numbness on anterolateral parts of B thighs  POSTURE: rounded shoulders, forward head, and increased thoracic kyphosis  PALPATION: Nontender to lumbar and  thoracic paraspinals  Notable piriformis tightness bilaterally with FADIR  LUMBAR ROM:   AROM eval  Flexion 80%  Extension 50%  Right lateral flexion To mid thigh*  Left lateral flexion To mid thigh*  Right rotation 50% *  Left rotation 50% *   (Blank rows = not tested)   *=pain   LOWER EXTREMITY MMT:    MMT Right eval Left eval  Hip flexion 4- 4-  Hip extension    Hip abduction 4- 4-  Hip adduction 4- 4-  Hip internal rotation    Hip external rotation    Knee flexion 4 4  Knee extension 4 4  Ankle dorsiflexion 4- 4-   (Blank rows = not tested)  LUMBAR SPECIAL TESTS:  Straight leg raise test: Negative and FABER test: Positive; FADIR; positive on B LE   FUNCTIONAL TESTS:  5 times sit to stand: 42.11 sec 6 minute walk test: NT on eval Dynamic Gait Index: NT on eval   GAIT: Distance walked: 69' Assistive device utilized: None Level of assistance: Modified independence Comments: decreased stance time bilaterally, mild L/R lateral lean    TODAY'S TREATMENT:                                                                                                                           DATE: 08/07/23  Subjective:  pt reports her back was sore from the new exercises initially, but says it got better with movement. Pt also reports having 2/10 pain in her neck but is unsure what caused it. She reports her L groin pain feels much better, she is not having any sharp pain.   TE: 41 mins Sci-Fit x 10 mins level 4 while discussing HEP and symptoms.   Quad and psoas stretch 2 x 60 secs hold  SLR to 6 inches on L side: 2 sets of 10 reps, AROM for first 6 reps, AAROM last 4 reps  Deadbug Perturbations with 6# ball at chest: 2x1 minute bouts with manual resistance/perturbations from therapist  Deadbug variations trialed but pt struggled secondary to L hip flexor weakness and pain in L groin  Bridges with ball squeeze,  2 x 60 secs - VC's for core engagement  Squats with ball  squeeze 2 x 60 secs - pt reported feeling fatigue in both knees  Resisted sidesteps with yellow TB on ladder x 4 laps    Not today Pt eduction about core activation and its significance, her condition and rehab progression.   Pelvic tilts 2 x 10 reps Core isometrics core engaged TKE 4 x 30 secs Core engaged Heel slides 3 x 30 secs NM: 30 mins = 990 ft without rest DGI= 15/24= Predictive fall risk.    PATIENT EDUCATION:  Education details: HEP, POC, goals Person educated: Patient Education method: Explanation, Demonstration, and Handouts Education comprehension: verbalized understanding and returned demonstration  HOME EXERCISE PROGRAM: Access Code: [email or web address] URL: [email or web address] Date: 07/18/2023 Prepared by: Maylon Peppers  Exercises - Standing Hip Abduction with Counter Support  - 2-3 x daily - 5-7 x weekly - 3 sets - 10 reps - Standing March with Counter Support  - 2-3 x daily - 5-7 x weekly - 3 sets - 10 reps - Heel Raises with Counter Support  - 2-3 x daily - 5-7 x weekly - 3 sets - 10 reps - Sit to Stand with Counter Support  - 2-3 x daily - 5-7 x weekly - 3 sets - 10 reps  ASSESSMENT:  CLINICAL IMPRESSION: Pt. presents to PT with reports of improved L groin pain since last session and no reports of numbness. Pt able to perform Psoas/quad stretch in [place] test position today with no increase in pain. She continues to demonstrate increased hip flexor weakness (L > R); pt was unable to perform deadbug exercise even with regression secondary to weakness and pain. The rest of the session consisted of core and lumbopelvic strengthening exercises, which the pt tolerated well with no pain. Pt will benefit from skilled PT interventions to address listed impairments to improve overall mobility and reduce pain during ADLs and walking.   OBJECTIVE IMPAIRMENTS: Abnormal gait, cardiopulmonary status limiting activity, decreased activity tolerance, decreased  balance, decreased endurance, decreased mobility, difficulty walking, decreased strength, improper body mechanics, postural dysfunction, and pain.   ACTIVITY LIMITATIONS: bending, standing, squatting, sleeping, stairs, transfers, and locomotion level  PARTICIPATION LIMITATIONS: cleaning, laundry, shopping, and community activity  PERSONAL FACTORS: Age, Behavior pattern, Fitness, and Past/current experiences are also affecting patient's functional outcome.   REHAB POTENTIAL: Good  CLINICAL DECISION MAKING: Stable/uncomplicated  EVALUATION COMPLEXITY: Low   GOALS: Goals reviewed with patient? Yes  SHORT TERM GOALS: Target date: 08/15/2023  Patient will be independent in HEP to improve strength/mobility for better functional independence with ADLs. Baseline: 8/29: HEP initiated Goal status: INITIAL  LONG TERM GOALS: Target date: 09/12/2023  Patient will increase FOTO score to equal to or greater than 48 to demonstrate statistically significant improvement in mobility and quality of life.  Baseline: 8/29: 31 Goal status: INITIAL  2.  Patient (> 41 years old) will complete five times sit to stand test in < 15 seconds indicating an increased LE strength and improved balance. Baseline: 8/29: 42.11 seconds Goal status: INITIAL  3.  Patient will increase six minute walk test distance by 200' for progression to community ambulator and improve gait ability. Baseline: 8/29: to be tested on 2nd visit Goal status: INITIAL  4.  Patient will increase BLE gross strength to 4+/5 as to improve functional strength for independent gait, increased standing tolerance and increased ADL ability. Baseline: 8/29: see above  Goal status: INITIAL  5.  Patient will increase Dynamic Gait Index (DGI) score to >19/24 as to demonstrate reduced fall risk and improved dynamic gait balance for better safety with community/home ambulation.  Baseline: 8/29: to be tested on 2nd visit  Goal status:  INITIAL   PLAN:  PT FREQUENCY: 1-2x/week  PT DURATION: 8 weeks  PLANNED INTERVENTIONS: Therapeutic exercises, Therapeutic activity, Neuromuscular re-education, Balance training, Gait training, Patient/Family education, Self Care, Joint mobilization, Stair training, DME instructions, Aquatic Therapy, Dry Needling, Spinal mobilization, Cryotherapy, Moist heat, and Manual therapy.  PLAN FOR NEXT SESSION: Teach pt how to get up from falls, progress core/hip strengthening exercises, trial resisted gait, walking marches  Cammie Mcgee, PT, DPT # [phone] Cena Benton, SPT 08/07/23, 12:11 PM

## 2023-08-09 ENCOUNTER — Encounter: Payer: Self-pay | Admitting: Physical Therapy

## 2023-08-09 ENCOUNTER — Ambulatory Visit: Payer: Medicare HMO | Admitting: Physical Therapy

## 2023-08-09 DIAGNOSIS — R262 Difficulty in walking, not elsewhere classified: Secondary | ICD-10-CM | POA: Diagnosis not present

## 2023-08-09 DIAGNOSIS — M6281 Muscle weakness (generalized): Secondary | ICD-10-CM | POA: Diagnosis not present

## 2023-08-09 DIAGNOSIS — M5459 Other low back pain: Secondary | ICD-10-CM | POA: Diagnosis not present

## 2023-08-09 NOTE — Therapy (Addendum)
 OUTPATIENT PHYSICAL THERAPY THORACOLUMBAR/LOWER EXTREMITY TREATMENT   Patient Name: Erin Camacho MRN: [id number] DOB:10/24/1946, 77 y.o., female Today's Date: 08/10/2023  END OF SESSION:  PT End of Session - 08/09/23 1427     Visit Number 6    Number of Visits 17    Date for PT Re-Evaluation 09/12/23    PT Start Time 1427    PT Stop Time 1516    PT Time Calculation (min) 49 min    Activity Tolerance Patient tolerated treatment well    Behavior During Therapy [facility] for tasks assessed/performed             Past Medical History:  Diagnosis Date   Allergy    hay fever   Anxiety    Arthritis    Chronic back pain    Diabetes mellitus    Dyspnea    a. 07/2017 Echo: EF 60-65%. No rwma. Mildly dil LA. Nl RV fxn.   GERD (gastroesophageal reflux disease)    History of hiatal hernia    Hyperlipidemia    Hypertension    Osteoarthritis    Pneumonia    Spinal stenosis    Type 2 diabetes mellitus (HCC)    UTI (lower urinary tract infection)    Past Surgical History:  Procedure Laterality Date   ABDOMINAL HYSTERECTOMY  1973   ABDOMINAL HYSTERECTOMY     APPENDECTOMY     APPLICATION OF ROBOTIC ASSISTANCE FOR SPINAL PROCEDURE N/A 09/15/2018   Procedure: APPLICATION OF ROBOTIC ASSISTANCE FOR SPINAL PROCEDURE;  Surgeon: Barnett Abu, MD;  Location: [facility] OR;  Service: Neurosurgery;  Laterality: N/A;   BACK SURGERY  04/17/13, 2017, 2019   CATARACT EXTRACTION W/PHACO Left 12/16/2019   Procedure: CATARACT EXTRACTION PHACO AND INTRAOCULAR LENS PLACEMENT (IOC) LEFT PANOPTIX LENS DIABETIC 8.07 01:19.2 10.2%;  Surgeon: Lockie Mola, MD;  Location: [facility];  Service: Ophthalmology;  Laterality: Left;  Diabetic - oral meds   CATARACT EXTRACTION W/PHACO Right 01/06/2020   Procedure: CATARACT EXTRACTION PHACO AND INTRAOCULAR LENS PLACEMENT (IOC) PANOPTIX LENS RIGHT DIABETIC 10.73 01:08.6 15.7%;  Surgeon: Lockie Mola, MD;  Location: [facility];  Service:  Ophthalmology;  Laterality: Right;  diabetic   CHOLECYSTECTOMY  1995   COLONOSCOPY WITH PROPOFOL N/A 03/15/2021   Procedure: COLONOSCOPY WITH PROPOFOL;  Surgeon: Earline Mayotte, MD;  Location: [facility];  Service: Endoscopy;  Laterality: N/A;   ESOPHAGOGASTRODUODENOSCOPY N/A 06/20/2022   Procedure: ESOPHAGOGASTRODUODENOSCOPY (EGD);  Surgeon: Toledo, Boykin Nearing, MD;  Location: [facility];  Service: Gastroenterology;  Laterality: N/A;   EYE SURGERY  2021   bilateral cataract removal with lens implants   HERNIA REPAIR  08/2006   lap band  08/2003   LUMBAR LAMINECTOMY/ DECOMPRESSION WITH MET-RX Left 12/29/2020   Procedure: Left Thoracic Seven-Eight Microdiscectomy;  Surgeon: Barnett Abu, MD;  Location: [facility] OR;  Service: Neurosurgery;  Laterality: Left;   SEPTOPLASTY  04/1978   TONSILLECTOMY AND ADENOIDECTOMY  1979   TOTAL HIP ARTHROPLASTY  02/2012   Right, Dr. Ernest Pine   TOTAL HIP ARTHROPLASTY Left 08/31/2019   Procedure: TOTAL HIP ARTHROPLASTY;  Surgeon: Donato Heinz, MD;  Location: [facility] ORS;  Service: Orthopedics;  Laterality: Left;   UPPER GI ENDOSCOPY  06/20/2022   Patient Active Problem List   Diagnosis Date Noted   Trigger finger 07/03/2023   Leg pain 11/29/2022   Right leg numbness 08/20/2022   Memory change 08/17/2022   Bilateral carotid artery stenosis 01/31/2022   Balance problem 01/31/2022   History of COVID-19 11/25/2021  Fall 05/22/2021   Herniated nucleus pulposus with myelopathy, thoracic 12/29/2020   Low back pain 09/15/2020   Opioid use (10 MME/day) 05/16/2020   Hypokalemia 04/28/2020   Chronic pain syndrome 04/20/2020   Disorder of skeletal system 04/20/2020   Chronic thoracic spine pain (Bilateral) (R>L) 04/20/2020   Myofascial pain syndrome of thoracic spine 04/20/2020   Chronic musculoskeletal pain 04/20/2020   Spasm of thoracic back muscle 04/20/2020   Weight loss 12/31/2019   History of total hip replacement (Left) 08/31/2019   Early satiety  07/05/2019   Lumbar stenosis with neurogenic claudication 09/15/2018   Shortness of breath 06/19/2018   Cough 04/13/2018   Aortic atherosclerosis (HCC) 01/11/2018   GERD (gastroesophageal reflux disease) 12/23/2016   Atrophic vaginitis 03/18/2016   Spondylolisthesis at L5-S1 level 12/06/2015   Rhinitis, allergic 11/22/2015   Right leg pain 07/27/2014   Neuritis or radiculitis due to rupture of lumbar intervertebral disc 06/28/2014   Chronic shoulder pain (Bilateral) 02/24/2014   History of bariatric surgery 01/18/2014   Status post bariatric surgery 01/18/2014   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   Depression, major, single episode, mild (HCC) 05/14/2013   Major depressive disorder, single episode 05/14/2013   Healthcare maintenance 01/07/2013   Hypercholesterolemia 08/22/2012   Hyperlipidemia 08/22/2012   Hypertension 07/07/2012   Diabetes mellitus type 2, diet-controlled (HCC) 07/07/2012   Insomnia 07/07/2012   Obesity (BMI 30-39.9) 07/07/2012   Essential (primary) hypertension 07/07/2012   Chronic low back pain (1ry area of Pain) (Bilateral) (R>L) w/o sciatica 10/18/2011    PCP: Dale [place], MD  REFERRING PROVIDER: Dale Weeki [place], MD  REFERRING DIAG: M79.604,M79.605 (ICD-10-CM) - Pain in both lower extremities  Rationale for Evaluation and Treatment: Rehabilitation  THERAPY DIAG:  Difficulty in walking, not elsewhere classified  Muscle weakness (generalized)  Other low back pain  ONSET DATE: after 2013 (after R hip replacement)  SUBJECTIVE:                                                                                                                                                                                           SUBJECTIVE STATEMENT: Patient reports BLE weakness and numbness. Numbness is constant on anterolateral thighs but gets worse with standing >10 minutes. Patient unable to ambulate her block around her home. Difficulty with stairs  (step to pattern). Patient has had 4 back surgeries - L1-S1 fusion and "rods" T8/9-L1 starting in 2014. Has had L and R hip replacements.   PERTINENT HISTORY:  Patient has been seeing Dr. Danielle Dess for her back with radicular pain. Has had a fall 4-5 months ago and hurt her L knee. Per Dr.  Scott's note from 07/05/23, patient reports weakness in upper thighs and limited in walking due to her legs. Dr. Lorin Picket discussed with patient about utilizing a walker to help with patient reporting she does better in stores with a cart she can lean on.   PAIN:  Are you having pain? Yes: NPRS scale: 4-5/10 at worst  Pain location: B legs  Pain description: restless Aggravating factors: laying down, standing for prolonged periods of time  Relieving factors: movement, medication   PRECAUTIONS: Fall  WEIGHT BEARING RESTRICTIONS: No  FALLS:  Has patient fallen in last 6 months? Yes. Number of falls 1  LIVING ENVIRONMENT:   Lives with: lives with their spouse Lives in: House/apartment Stairs: No Has following equipment at home: Single point cane and Environmental consultant - 4 wheeled  OCCUPATION: retired   PLOF: Independent  PATIENT GOALS: to be able to walk a distance and not hurt   NEXT MD VISIT: 09/2023 (PCP)  OBJECTIVE:   DIAGNOSTIC FINDINGS:  N/A - unable to obtain due to outside provider   PATIENT SURVEYS:  FOTO 31 with goal of 48  SCREENING FOR RED FLAGS: Bowel or bladder incontinence: Yes: at times, patient reports unaware of bowel movement until trip to the restroom.  No issues with bladder incontinence.  Spinal tumors: No Cauda equina syndrome: No Compression fracture: No Abdominal aneurysm: No  COGNITION: Overall cognitive status: Within functional limits for tasks assessed     SENSATION: Sensory testing WFL, however patient reports numbness on anterolateral parts of B thighs  POSTURE: rounded shoulders, forward head, and increased thoracic kyphosis  PALPATION: Nontender to lumbar and  thoracic paraspinals  Notable piriformis tightness bilaterally with FADIR  LUMBAR ROM:   AROM eval  Flexion 80%  Extension 50%  Right lateral flexion To mid thigh*  Left lateral flexion To mid thigh*  Right rotation 50% *  Left rotation 50% *   (Blank rows = not tested)   *=pain   LOWER EXTREMITY MMT:    MMT Right eval Left eval  Hip flexion 4- 4-  Hip extension    Hip abduction 4- 4-  Hip adduction 4- 4-  Hip internal rotation    Hip external rotation    Knee flexion 4 4  Knee extension 4 4  Ankle dorsiflexion 4- 4-   (Blank rows = not tested)  LUMBAR SPECIAL TESTS:  Straight leg raise test: Negative and FABER test: Positive; FADIR; positive on B LE   FUNCTIONAL TESTS:  5 times sit to stand: 42.11 sec 6 minute walk test: NT on eval Dynamic Gait Index: NT on eval   GAIT: Distance walked: 57' Assistive device utilized: None Level of assistance: Modified independence Comments: decreased stance time bilaterally, mild L/R lateral lean    TODAY'S TREATMENT:                                                                                                                           DATE: 08/10/23  Subjective:  Pt. States she has difficulty with increase walking distances at [place] and has to use shopping cart for support.  Pt. Reports no pain at this time.  Pt. Discussed balance while at home/ kitchen to prevent falling.  Pt. Reports last fall was several months ago at [facility] in [place].    TE:   Nustep L4 for 10 mins B UE/LE.    Resisted gait in //-bars: 2BTB 4x all 4-planes.  SBA/CGA for safety and cuing to maintain LE in midline position.    Seated marching 20x/ reassessment of LE MMT.    TRX 10x2 (forward/lateral).    Supine knee to chest with yellow ball (single leg)- 15x each.    SLR to 6 inches on L side: 2 sets of 10 reps, AROM for first 6 reps, AAROM last 4 reps  Walking outside on varying terrain.      Not today Pt eduction about core activation  and its significance, her condition and rehab progression.   Pelvic tilts 2 x 10 reps Core isometrics core engaged TKE 4 x 30 secs Core engaged Heel slides 3 x 30 secs NM: 30 mins = 990 ft without rest DGI= 15/24= Predictive fall risk.    PATIENT EDUCATION:  Education details: HEP, POC, goals Person educated: Patient Education method: Explanation, Demonstration, and Handouts Education comprehension: verbalized understanding and returned demonstration  HOME EXERCISE PROGRAM: Access Code: [email or web address] URL: [email or web address] Date: 07/18/2023 Prepared by: Maylon Peppers  Exercises - Standing Hip Abduction with Counter Support  - 2-3 x daily - 5-7 x weekly - 3 sets - 10 reps - Standing March with Counter Support  - 2-3 x daily - 5-7 x weekly - 3 sets - 10 reps - Heel Raises with Counter Support  - 2-3 x daily - 5-7 x weekly - 3 sets - 10 reps - Sit to Stand with Counter Support  - 2-3 x daily - 5-7 x weekly - 3 sets - 10 reps  ASSESSMENT:  CLINICAL IMPRESSION: She continues to demonstrate increased hip flexor weakness (L > R); pt was unable to perform SLR exercise even with regression secondary to weakness and pain. The rest of the session consisted of core and lumbopelvic strengthening exercises, which the pt tolerated well with no pain. Pt will benefit from skilled PT interventions to address listed impairments to improve overall mobility and reduce pain during ADLs and walking.   OBJECTIVE IMPAIRMENTS: Abnormal gait, cardiopulmonary status limiting activity, decreased activity tolerance, decreased balance, decreased endurance, decreased mobility, difficulty walking, decreased strength, improper body mechanics, postural dysfunction, and pain.   ACTIVITY LIMITATIONS: bending, standing, squatting, sleeping, stairs, transfers, and locomotion level  PARTICIPATION LIMITATIONS: cleaning, laundry, shopping, and community activity  PERSONAL FACTORS: Age, Behavior  pattern, Fitness, and Past/current experiences are also affecting patient's functional outcome.   REHAB POTENTIAL: Good  CLINICAL DECISION MAKING: Stable/uncomplicated  EVALUATION COMPLEXITY: Low   GOALS: Goals reviewed with patient? Yes  SHORT TERM GOALS: Target date: 08/15/2023  Patient will be independent in HEP to improve strength/mobility for better functional independence with ADLs. Baseline: 8/29: HEP initiated Goal status: INITIAL  LONG TERM GOALS: Target date: 09/12/2023  Patient will increase FOTO score to equal to or greater than 48 to demonstrate statistically significant improvement in mobility and quality of life.  Baseline: 8/29: 31 Goal status: INITIAL  2.  Patient (> 76 years old) will complete five times sit to stand test in < 15 seconds indicating an increased LE strength and improved balance. Baseline: 8/29:  42.11 seconds Goal status: INITIAL  3.  Patient will increase six minute walk test distance by 200' for progression to community ambulator and improve gait ability. Baseline: 8/29: to be tested on 2nd visit Goal status: INITIAL  4.  Patient will increase BLE gross strength to 4+/5 as to improve functional strength for independent gait, increased standing tolerance and increased ADL ability. Baseline: 8/29: see above  Goal status: INITIAL  5.  Patient will increase Dynamic Gait Index (DGI) score to >19/24 as to demonstrate reduced fall risk and improved dynamic gait balance for better safety with community/home ambulation.  Baseline: 8/29: to be tested on 2nd visit  Goal status: INITIAL   PLAN:  PT FREQUENCY: 1-2x/week  PT DURATION: 8 weeks  PLANNED INTERVENTIONS: Therapeutic exercises, Therapeutic activity, Neuromuscular re-education, Balance training, Gait training, Patient/Family education, Self Care, Joint mobilization, Stair training, DME instructions, Aquatic Therapy, Dry Needling, Spinal mobilization, Cryotherapy, Moist heat, and Manual  therapy.  PLAN FOR NEXT SESSION: Teach pt how to get up from falls, progress core/hip strengthening exercises  Cammie Mcgee, PT, DPT # [phone] Cena Benton, SPT 08/10/23, 4:21 PM

## 2023-08-13 DIAGNOSIS — J301 Allergic rhinitis due to pollen: Secondary | ICD-10-CM | POA: Diagnosis not present

## 2023-08-14 ENCOUNTER — Ambulatory Visit: Payer: Medicare HMO

## 2023-08-14 DIAGNOSIS — M5459 Other low back pain: Secondary | ICD-10-CM | POA: Diagnosis not present

## 2023-08-14 DIAGNOSIS — R262 Difficulty in walking, not elsewhere classified: Secondary | ICD-10-CM

## 2023-08-14 DIAGNOSIS — M6281 Muscle weakness (generalized): Secondary | ICD-10-CM | POA: Diagnosis not present

## 2023-08-14 NOTE — Therapy (Signed)
OUTPATIENT PHYSICAL THERAPY THORACOLUMBAR/LOWER EXTREMITY TREATMENT   Patient Name: Erin Camacho MRN: [id number] DOB:September 19, 1946, 77 y.o., female Today's Date: 08/14/2023  END OF SESSION:  PT End of Session - 08/14/23 1527     PT Stop Time 1516              Past Medical History:  Diagnosis Date   Allergy    hay fever   Anxiety    Arthritis    Chronic back pain    Diabetes mellitus    Dyspnea    a. 07/2017 Echo: EF 60-65%. No rwma. Mildly dil LA. Nl RV fxn.   GERD (gastroesophageal reflux disease)    History of hiatal hernia    Hyperlipidemia    Hypertension    Osteoarthritis    Pneumonia    Spinal stenosis    Type 2 diabetes mellitus (HCC)    UTI (lower urinary tract infection)    Past Surgical History:  Procedure Laterality Date   ABDOMINAL HYSTERECTOMY  1973   ABDOMINAL HYSTERECTOMY     APPENDECTOMY     APPLICATION OF ROBOTIC ASSISTANCE FOR SPINAL PROCEDURE N/A 09/15/2018   Procedure: APPLICATION OF ROBOTIC ASSISTANCE FOR SPINAL PROCEDURE;  Surgeon: Barnett Abu, MD;  Location: [facility];  Service: Neurosurgery;  Laterality: N/A;   BACK SURGERY  04/17/13, 2017, 2019   CATARACT EXTRACTION W/PHACO Left 12/16/2019   Procedure: CATARACT EXTRACTION PHACO AND INTRAOCULAR LENS PLACEMENT (IOC) LEFT PANOPTIX LENS DIABETIC 8.07 01:19.2 10.2%;  Surgeon: Lockie Mola, MD;  Location: [facility];  Service: Ophthalmology;  Laterality: Left;  Diabetic - oral meds   CATARACT EXTRACTION W/PHACO Right 01/06/2020   Procedure: CATARACT EXTRACTION PHACO AND INTRAOCULAR LENS PLACEMENT (IOC) PANOPTIX LENS RIGHT DIABETIC 10.73 01:08.6 15.7%;  Surgeon: Lockie Mola, MD;  Location: [facility];  Service: Ophthalmology;  Laterality: Right;  diabetic   CHOLECYSTECTOMY  1995   COLONOSCOPY WITH PROPOFOL N/A 03/15/2021   Procedure: COLONOSCOPY WITH PROPOFOL;  Surgeon: Earline Mayotte, MD;  Location: [facility];  Service: Endoscopy;  Laterality: N/A;    ESOPHAGOGASTRODUODENOSCOPY N/A 06/20/2022   Procedure: ESOPHAGOGASTRODUODENOSCOPY (EGD);  Surgeon: Toledo, Boykin Nearing, MD;  Location: [facility];  Service: Gastroenterology;  Laterality: N/A;   EYE SURGERY  2021   bilateral cataract removal with lens implants   HERNIA REPAIR  08/2006   lap band  08/2003   LUMBAR LAMINECTOMY/ DECOMPRESSION WITH MET-RX Left 12/29/2020   Procedure: Left Thoracic Seven-Eight Microdiscectomy;  Surgeon: Barnett Abu, MD;  Location: [facility];  Service: Neurosurgery;  Laterality: Left;   SEPTOPLASTY  04/1978   TONSILLECTOMY AND ADENOIDECTOMY  1979   TOTAL HIP ARTHROPLASTY  02/2012   Right, Dr. Ernest Pine   TOTAL HIP ARTHROPLASTY Left 08/31/2019   Procedure: TOTAL HIP ARTHROPLASTY;  Surgeon: Donato Heinz, MD;  Location: [facility];  Service: Orthopedics;  Laterality: Left;   UPPER GI ENDOSCOPY  06/20/2022   Patient Active Problem List   Diagnosis Date Noted   Trigger finger 07/03/2023   Leg pain 11/29/2022   Right leg numbness 08/20/2022   Memory change 08/17/2022   Bilateral carotid artery stenosis 01/31/2022   Balance problem 01/31/2022   History of COVID-19 11/25/2021   Fall 05/22/2021   Herniated nucleus pulposus with myelopathy, thoracic 12/29/2020   Low back pain 09/15/2020   Opioid use (10 MME/day) 05/16/2020   Hypokalemia 04/28/2020   Chronic pain syndrome 04/20/2020   Disorder of skeletal system 04/20/2020   Chronic thoracic spine pain (Bilateral) (R>L) 04/20/2020   Myofascial  pain syndrome of thoracic spine 04/20/2020   Chronic musculoskeletal pain 04/20/2020   Spasm of thoracic back muscle 04/20/2020   Weight loss 12/31/2019   History of total hip replacement (Left) 08/31/2019   Early satiety 07/05/2019   Lumbar stenosis with neurogenic claudication 09/15/2018   Shortness of breath 06/19/2018   Cough 04/13/2018   Aortic atherosclerosis (HCC) 01/11/2018   GERD (gastroesophageal reflux disease) 12/23/2016   Atrophic vaginitis 03/18/2016    Spondylolisthesis at L5-S1 level 12/06/2015   Rhinitis, allergic 11/22/2015   Right leg pain 07/27/2014   Neuritis or radiculitis due to rupture of lumbar intervertebral disc 06/28/2014   Chronic shoulder pain (Bilateral) 02/24/2014   History of bariatric surgery 01/18/2014   Status post bariatric surgery 01/18/2014   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   Depression, major, single episode, mild (HCC) 05/14/2013   Major depressive disorder, single episode 05/14/2013   Healthcare maintenance 01/07/2013   Hypercholesterolemia 08/22/2012   Hyperlipidemia 08/22/2012   Hypertension 07/07/2012   Diabetes mellitus type 2, diet-controlled (HCC) 07/07/2012   Insomnia 07/07/2012   Obesity (BMI 30-39.9) 07/07/2012   Essential (primary) hypertension 07/07/2012   Chronic low back pain (1ry area of Pain) (Bilateral) (R>L) w/o sciatica 10/18/2011    PCP: Dale [place], MD  REFERRING PROVIDER: Dale Gro[place]on, MD  REFERRING DIAG: M79.604,M79.605 (ICD-10-CM) - Pain in both lower extremities  Rationale for Evaluation and Treatment: Rehabilitation  THERAPY DIAG:  Difficulty in walking, not elsewhere classified  Muscle weakness (generalized)  Other low back pain  ONSET DATE: after 2013 (after R hip replacement)  SUBJECTIVE:                                                                                                                                                                                           SUBJECTIVE STATEMENT: Patient reports BLE weakness and numbness. Numbness is constant on anterolateral thighs but gets worse with standing >10 minutes. Patient unable to ambulate her block around her home. Difficulty with stairs (step to pattern). Patient has had 4 back surgeries - L1-S1 fusion and "rods" T8/9-L1 starting in 2014. Has had L and R hip replacements.   PERTINENT HISTORY:  Patient has been seeing Dr. Danielle Dess for her back with radicular pain. Has had a fall 4-5 months  ago and hurt her L knee. Per Dr. Roby Lofts note from 07/05/23, patient reports weakness in upper thighs and limited in walking due to her legs. Dr. Lorin Picket discussed with patient about utilizing a walker to help with patient reporting she does better in stores with a cart she can lean on.   PAIN:  Are you having pain? Yes:  NPRS scale: 4-5/10 at worst  Pain location: B legs  Pain description: restless Aggravating factors: laying down, standing for prolonged periods of time  Relieving factors: movement, medication   PRECAUTIONS: Fall  WEIGHT BEARING RESTRICTIONS: No  FALLS:  Has patient fallen in last 6 months? Yes. Number of falls 1  LIVING ENVIRONMENT:   Lives with: lives with their spouse Lives in: House/apartment Stairs: No Has following equipment at home: Single point cane and Environmental consultant - 4 wheeled  OCCUPATION: retired   PLOF: Independent  PATIENT GOALS: to be able to walk a distance and not hurt   NEXT MD VISIT: 09/2023 (PCP)  OBJECTIVE:   DIAGNOSTIC FINDINGS:  N/A - unable to obtain due to outside provider   PATIENT SURVEYS:  FOTO 31 with goal of 48  SCREENING FOR RED FLAGS: Bowel or bladder incontinence: Yes: at times, patient reports unaware of bowel movement until trip to the restroom.  No issues with bladder incontinence.  Spinal tumors: No Cauda equina syndrome: No Compression fracture: No Abdominal aneurysm: No  COGNITION: Overall cognitive status: Within functional limits for tasks assessed     SENSATION: Sensory testing WFL, however patient reports numbness on anterolateral parts of B thighs  POSTURE: rounded shoulders, forward head, and increased thoracic kyphosis  PALPATION: Nontender to lumbar and thoracic paraspinals  Notable piriformis tightness bilaterally with FADIR  LUMBAR ROM:   AROM eval  Flexion 80%  Extension 50%  Right lateral flexion To mid thigh*  Left lateral flexion To mid thigh*  Right rotation 50% *  Left rotation 50% *    (Blank rows = not tested)   *=pain   LOWER EXTREMITY MMT:    MMT Right eval Left eval  Hip flexion 4- 4-  Hip extension    Hip abduction 4- 4-  Hip adduction 4- 4-  Hip internal rotation    Hip external rotation    Knee flexion 4 4  Knee extension 4 4  Ankle dorsiflexion 4- 4-   (Blank rows = not tested)  LUMBAR SPECIAL TESTS:  Straight leg raise test: Negative and FABER test: Positive; FADIR; positive on B LE   FUNCTIONAL TESTS:  5 times sit to stand: 42.11 sec 6 minute walk test: NT on eval Dynamic Gait Index: NT on eval   GAIT: Distance walked: 70' Assistive device utilized: None Level of assistance: Modified independence Comments: decreased stance time bilaterally, mild L/R lateral lean    TODAY'S TREATMENT:                                                                                                                           DATE: 08/14/23  Subjective: Pt reports to PT with no pain in her L hip. She says she feels like she is 75% improved compared to when she started PT. She says a drain in her house flooded that connected to her washing machine so she is pretty tired from taking care of that over the last  few days.  TE: 45 Min  Nustep L4 for 10 mins B UE/LE.   Hamstring stretch x 60 secs Seated marches with RTB 2 x 60 secs STS with #5 wt in hands 60 secs STS with RTB and #5 2 x 30 secs STS with Ball squeeze and #5 2 x 30 secs 3 ways hip with BTB 2 x 30 secs each Heel raises 2 x 20 reps  Not today:  Resisted gait in //-bars: 2BTB 4x all 4-planes.  SBA/CGA for safety and cuing to maintain LE in midline position.   Seated marching 20x/ reassessment of LE MMT.   TRX 10x2 (forward/lateral).   Supine knee to chest with yellow ball (single leg)- 15x each.   SLR to 6 inches on L side: 2 sets of 10 reps, AROM for first 6 reps, AAROM last 4 reps Walking outside on varying terrain.      Not today Pt eduction about core activation and its significance, her  condition and rehab progression.   Pelvic tilts 2 x 10 reps Core isometrics core engaged TKE 4 x 30 secs Core engaged Heel slides 3 x 30 secs NM: 30 mins = 990 ft without rest DGI= 15/24= Predictive fall risk.    PATIENT EDUCATION:  Education details: HEP, POC, goals Person educated: Patient Education method: Explanation, Demonstration, and Handouts Education comprehension: verbalized understanding and returned demonstration  HOME EXERCISE PROGRAM: Access Code: [email or web address] URL: [email or web address] Date: 07/18/2023 Prepared by: Maylon Peppers  Exercises - Standing Hip Abduction with Counter Support  - 2-3 x daily - 5-7 x weekly - 3 sets - 10 reps - Standing March with Counter Support  - 2-3 x daily - 5-7 x weekly - 3 sets - 10 reps - Heel Raises with Counter Support  - 2-3 x daily - 5-7 x weekly - 3 sets - 10 reps - Sit to Stand with Counter Support  - 2-3 x daily - 5-7 x weekly - 3 sets - 10 reps  ASSESSMENT:  CLINICAL IMPRESSION: She continues to demonstrate increased hip flexor weakness (L > R); causing sense of fatigue buckling in hips with walking ans standing activities. Numbness appears to be improving as per pt. Pt L hip is showing steady progress with ROM and strength. Pt challenged with strengthening of proximal joint to promote functional gins with increased stability. pt tolerated well with no pain. Pt will benefit from skilled PT interventions to address listed impairments to improve overall mobility and reduce pain during ADLs and walking.   OBJECTIVE IMPAIRMENTS: Abnormal gait, cardiopulmonary status limiting activity, decreased activity tolerance, decreased balance, decreased endurance, decreased mobility, difficulty walking, decreased strength, improper body mechanics, postural dysfunction, and pain.   ACTIVITY LIMITATIONS: bending, standing, squatting, sleeping, stairs, transfers, and locomotion level  PARTICIPATION LIMITATIONS: cleaning,  laundry, shopping, and community activity  PERSONAL FACTORS: Age, Behavior pattern, Fitness, and Past/current experiences are also affecting patient's functional outcome.   REHAB POTENTIAL: Good  CLINICAL DECISION MAKING: Stable/uncomplicated  EVALUATION COMPLEXITY: Low   GOALS: Goals reviewed with patient? Yes  SHORT TERM GOALS: Target date: 08/15/2023  Patient will be independent in HEP to improve strength/mobility for better functional independence with ADLs. Baseline: 8/29: HEP initiated Goal status: INITIAL  LONG TERM GOALS: Target date: 09/12/2023  Patient will increase FOTO score to equal to or greater than 48 to demonstrate statistically significant improvement in mobility and quality of life.  Baseline: 8/29: 31 Goal status: INITIAL  2.  Patient (> 43 years old) will complete five  times sit to stand test in < 15 seconds indicating an increased LE strength and improved balance. Baseline: 8/29: 42.11 seconds Goal status: INITIAL  3.  Patient will increase six minute walk test distance by 200' for progression to community ambulator and improve gait ability. Baseline: 8/29: to be tested on 2nd visit Goal status: INITIAL  4.  Patient will increase BLE gross strength to 4+/5 as to improve functional strength for independent gait, increased standing tolerance and increased ADL ability. Baseline: 8/29: see above  Goal status: INITIAL  5.  Patient will increase Dynamic Gait Index (DGI) score to >19/24 as to demonstrate reduced fall risk and improved dynamic gait balance for better safety with community/home ambulation.  Baseline: 8/29: to be tested on 2nd visit  Goal status: INITIAL   PLAN:  PT FREQUENCY: 1-2x/week  PT DURATION: 8 weeks  PLANNED INTERVENTIONS: Therapeutic exercises, Therapeutic activity, Neuromuscular re-education, Balance training, Gait training, Patient/Family education, Self Care, Joint mobilization, Stair training, DME instructions, Aquatic  Therapy, Dry Needling, Spinal mobilization, Cryotherapy, Moist heat, and Manual therapy.  PLAN FOR NEXT SESSION: Teach pt how to get up from falls, progress core/hip strengthening exercises  Cammie Mcgee, PT, DPT # [phone] Cena Benton, SPT 08/14/23, 3:27 PM

## 2023-08-19 ENCOUNTER — Encounter: Payer: Self-pay | Admitting: Physical Therapy

## 2023-08-19 ENCOUNTER — Ambulatory Visit: Payer: Medicare HMO | Admitting: Physical Therapy

## 2023-08-19 DIAGNOSIS — M19071 Primary osteoarthritis, right ankle and foot: Secondary | ICD-10-CM | POA: Diagnosis not present

## 2023-08-19 DIAGNOSIS — M2011 Hallux valgus (acquired), right foot: Secondary | ICD-10-CM | POA: Diagnosis not present

## 2023-08-19 DIAGNOSIS — L851 Acquired keratosis [keratoderma] palmaris et plantaris: Secondary | ICD-10-CM | POA: Diagnosis not present

## 2023-08-19 DIAGNOSIS — M5459 Other low back pain: Secondary | ICD-10-CM | POA: Diagnosis not present

## 2023-08-19 DIAGNOSIS — M6281 Muscle weakness (generalized): Secondary | ICD-10-CM | POA: Diagnosis not present

## 2023-08-19 DIAGNOSIS — E119 Type 2 diabetes mellitus without complications: Secondary | ICD-10-CM | POA: Diagnosis not present

## 2023-08-19 DIAGNOSIS — M79671 Pain in right foot: Secondary | ICD-10-CM | POA: Diagnosis not present

## 2023-08-19 DIAGNOSIS — R262 Difficulty in walking, not elsewhere classified: Secondary | ICD-10-CM | POA: Diagnosis not present

## 2023-08-19 NOTE — Therapy (Signed)
OUTPATIENT PHYSICAL THERAPY THORACOLUMBAR/LOWER EXTREMITY TREATMENT   Patient Name: Erin Camacho MRN: [id number] DOB:[birth year], 77 y.o., female Today's Date: 08/19/2023  END OF SESSION:  PT End of Session - 08/19/23 1205     Visit Number 8    Number of Visits 17    Date for PT Re-Evaluation 09/12/23    PT Start Time 1115    PT Stop Time 1159    PT Time Calculation (min) 44 min    Activity Tolerance Patient tolerated treatment well    Behavior During Therapy [facility] for tasks assessed/performed             Past Medical History:  Diagnosis Date   Allergy    hay fever   Anxiety    Arthritis    Chronic back pain    Diabetes mellitus    Dyspnea    a. 07/2017 Echo: EF 60-65%. No rwma. Mildly dil LA. Nl RV fxn.   GERD (gastroesophageal reflux disease)    History of hiatal hernia    Hyperlipidemia    Hypertension    Osteoarthritis    Pneumonia    Spinal stenosis    Type 2 diabetes mellitus (HCC)    UTI (lower urinary tract infection)    Past Surgical History:  Procedure Laterality Date   ABDOMINAL HYSTERECTOMY  1973   ABDOMINAL HYSTERECTOMY     APPENDECTOMY     APPLICATION OF ROBOTIC ASSISTANCE FOR SPINAL PROCEDURE N/A 09/15/2018   Procedure: APPLICATION OF ROBOTIC ASSISTANCE FOR SPINAL PROCEDURE;  Surgeon: Barnett Abu, MD;  Location: [facility] OR;  Service: Neurosurgery;  Laterality: N/A;   BACK SURGERY  04/17/13, 2017, 2019   CATARACT EXTRACTION W/PHACO Left 12/16/2019   Procedure: CATARACT EXTRACTION PHACO AND INTRAOCULAR LENS PLACEMENT (IOC) LEFT PANOPTIX LENS DIABETIC 8.07 01:19.2 10.2%;  Surgeon: Lockie Mola, MD;  Location: [facility];  Service: Ophthalmology;  Laterality: Left;  Diabetic - oral meds   CATARACT EXTRACTION W/PHACO Right 01/06/2020   Procedure: CATARACT EXTRACTION PHACO AND INTRAOCULAR LENS PLACEMENT (IOC) PANOPTIX LENS RIGHT DIABETIC 10.73 01:08.6 15.7%;  Surgeon: Lockie Mola, MD;  Location: [facility];  Service:  Ophthalmology;  Laterality: Right;  diabetic   CHOLECYSTECTOMY  1995   COLONOSCOPY WITH PROPOFOL N/A 03/15/2021   Procedure: COLONOSCOPY WITH PROPOFOL;  Surgeon: Earline Mayotte, MD;  Location: [facility];  Service: Endoscopy;  Laterality: N/A;   ESOPHAGOGASTRODUODENOSCOPY N/A 06/20/2022   Procedure: ESOPHAGOGASTRODUODENOSCOPY (EGD);  Surgeon: Toledo, Boykin Nearing, MD;  Location: [facility];  Service: Gastroenterology;  Laterality: N/A;   EYE SURGERY  2021   bilateral cataract removal with lens implants   HERNIA REPAIR  08/2006   lap band  08/2003   LUMBAR LAMINECTOMY/ DECOMPRESSION WITH MET-RX Left 12/29/2020   Procedure: Left Thoracic Seven-Eight Microdiscectomy;  Surgeon: Barnett Abu, MD;  Location: [facility] OR;  Service: Neurosurgery;  Laterality: Left;   SEPTOPLASTY  04/1978   TONSILLECTOMY AND ADENOIDECTOMY  1979   TOTAL HIP ARTHROPLASTY  02/2012   Right, Dr. Ernest Pine   TOTAL HIP ARTHROPLASTY Left 08/31/2019   Procedure: TOTAL HIP ARTHROPLASTY;  Surgeon: Donato Heinz, MD;  Location: [facility];  Service: Orthopedics;  Laterality: Left;   UPPER GI ENDOSCOPY  06/20/2022   Patient Active Problem List   Diagnosis Date Noted   Trigger finger 07/03/2023   Leg pain 11/29/2022   Right leg numbness 08/20/2022   Memory change 08/17/2022   Bilateral carotid artery stenosis 01/31/2022   Balance problem 01/31/2022   History of COVID-19 11/25/2021  Fall 05/22/2021   Herniated nucleus pulposus with myelopathy, thoracic 12/29/2020   Low back pain 09/15/2020   Opioid use (10 MME/day) 05/16/2020   Hypokalemia 04/28/2020   Chronic pain syndrome 04/20/2020   Disorder of skeletal system 04/20/2020   Chronic thoracic spine pain (Bilateral) (R>L) 04/20/2020   Myofascial pain syndrome of thoracic spine 04/20/2020   Chronic musculoskeletal pain 04/20/2020   Spasm of thoracic back muscle 04/20/2020   Weight loss 12/31/2019   History of total hip replacement (Left) 08/31/2019   Early satiety  07/05/2019   Lumbar stenosis with neurogenic claudication 09/15/2018   Shortness of breath 06/19/2018   Cough 04/13/2018   Aortic atherosclerosis (HCC) 01/11/2018   GERD (gastroesophageal reflux disease) 12/23/2016   Atrophic vaginitis 03/18/2016   Spondylolisthesis at L5-S1 level 12/06/2015   Rhinitis, allergic 11/22/2015   Right leg pain 07/27/2014   Neuritis or radiculitis due to rupture of lumbar intervertebral disc 06/28/2014   Chronic shoulder pain (Bilateral) 02/24/2014   History of bariatric surgery 01/18/2014   Status post bariatric surgery 01/18/2014   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   Depression, major, single episode, mild (HCC) 05/14/2013   Major depressive disorder, single episode 05/14/2013   Healthcare maintenance 01/07/2013   Hypercholesterolemia 08/22/2012   Hyperlipidemia 08/22/2012   Hypertension 07/07/2012   Diabetes mellitus type 2, diet-controlled (HCC) 07/07/2012   Insomnia 07/07/2012   Obesity (BMI 30-39.9) 07/07/2012   Essential (primary) hypertension 07/07/2012   Chronic low back pain (1ry area of Pain) (Bilateral) (R>L) w/o sciatica 10/18/2011    PCP: Dale Woodlawn, MD  REFERRING PROVIDER: Dale [place], MD  REFERRING DIAG: M79.604,M79.605 (ICD-10-CM) - Pain in both lower extremities  Rationale for Evaluation and Treatment: Rehabilitation  THERAPY DIAG:  Difficulty in walking, not elsewhere classified  Muscle weakness (generalized)  Other low back pain  ONSET DATE: after 2013 (after R hip replacement)  SUBJECTIVE:                                                                                                                                                                                           SUBJECTIVE STATEMENT: Patient reports BLE weakness and numbness. Numbness is constant on anterolateral thighs but gets worse with standing >10 minutes. Patient unable to ambulate her block around her home. Difficulty with stairs  (step to pattern). Patient has had 4 back surgeries - L1-S1 fusion and "rods" T8/9-L1 starting in 2014. Has had L and R hip replacements.   PERTINENT HISTORY:  Patient has been seeing Dr. Danielle Dess for her back with radicular pain. Has had a fall 4-5 months ago and hurt her L knee. Per Dr.  Scott's note from 07/05/23, patient reports weakness in upper thighs and limited in walking due to her legs. Dr. Lorin Picket discussed with patient about utilizing a walker to help with patient reporting she does better in stores with a cart she can lean on.   PAIN:  Are you having pain? Yes: NPRS scale: 4-5/10 at worst  Pain location: B legs  Pain description: restless Aggravating factors: laying down, standing for prolonged periods of time  Relieving factors: movement, medication   PRECAUTIONS: Fall  WEIGHT BEARING RESTRICTIONS: No  FALLS:  Has patient fallen in last 6 months? Yes. Number of falls 1  LIVING ENVIRONMENT:   Lives with: lives with their spouse Lives in: House/apartment Stairs: No Has following equipment at home: Single point cane and Environmental consultant - 4 wheeled  OCCUPATION: retired   PLOF: Independent  PATIENT GOALS: to be able to walk a distance and not hurt   NEXT MD VISIT: 09/2023 (PCP)  OBJECTIVE:   DIAGNOSTIC FINDINGS:  N/A - unable to obtain due to outside provider   PATIENT SURVEYS:  FOTO 31 with goal of 48  SCREENING FOR RED FLAGS: Bowel or bladder incontinence: Yes: at times, patient reports unaware of bowel movement until trip to the restroom.  No issues with bladder incontinence.  Spinal tumors: No Cauda equina syndrome: No Compression fracture: No Abdominal aneurysm: No  COGNITION: Overall cognitive status: Within functional limits for tasks assessed     SENSATION: Sensory testing WFL, however patient reports numbness on anterolateral parts of B thighs  POSTURE: rounded shoulders, forward head, and increased thoracic kyphosis  PALPATION: Nontender to lumbar and  thoracic paraspinals  Notable piriformis tightness bilaterally with FADIR  LUMBAR ROM:   AROM eval  Flexion 80%  Extension 50%  Right lateral flexion To mid thigh*  Left lateral flexion To mid thigh*  Right rotation 50% *  Left rotation 50% *   (Blank rows = not tested)   *=pain   LOWER EXTREMITY MMT:    MMT Right eval Left eval  Hip flexion 4- 4-  Hip extension    Hip abduction 4- 4-  Hip adduction 4- 4-  Hip internal rotation    Hip external rotation    Knee flexion 4 4  Knee extension 4 4  Ankle dorsiflexion 4- 4-   (Blank rows = not tested)  LUMBAR SPECIAL TESTS:  Straight leg raise test: Negative and FABER test: Positive; FADIR; positive on B LE   FUNCTIONAL TESTS:  5 times sit to stand: 42.11 sec 6 minute walk test: NT on eval Dynamic Gait Index: NT on eval   GAIT: Distance walked: 56' Assistive device utilized: None Level of assistance: Modified independence Comments: decreased stance time bilaterally, mild L/R lateral lean    TODAY'S TREATMENT:                                                                                                                           DATE: 08/19/23  Subjective:  Pt reports no pain in her L hip today; she says her back is a little sore from some of her home exercises. She says all her exercises are still a good challenge; she struggles with the sit-to-stands the most right now.   Therex: Nustep L4 for 10 mins B UE/LE.    Generalized LE stretch (hamstrings, piriformis, glutes) x 5 minute at start of session   [place]: 2x15 - mild low back pain at start of first set, improved with repetition  Deadbug Perturbations: 2x1 minute holds with 4# blue ball at chest  TRX Squats: 2x15 with chair for cueing for squat depth  SLR: x10 each side - much improved since initial evaluation; no pain and pt demonstrates symmetrical ROM  Resisted Sidesteps in // bars with green TB around knees: 4 laps each direction  Standing  3-way hip with green TB around knees: 1x10 each leg     PATIENT EDUCATION:  Education details: HEP, POC, goals Person educated: Patient Education method: Explanation, Demonstration, and Handouts Education comprehension: verbalized understanding and returned demonstration  HOME EXERCISE PROGRAM: Access Code: [email or web address] URL: [email or web address] Date: 07/18/2023 Prepared by: Maylon Peppers  Exercises - Standing Hip Abduction with Counter Support  - 2-3 x daily - 5-7 x weekly - 3 sets - 10 reps - Standing March with Counter Support  - 2-3 x daily - 5-7 x weekly - 3 sets - 10 reps - Heel Raises with Counter Support  - 2-3 x daily - 5-7 x weekly - 3 sets - 10 reps - Sit to Stand with Counter Support  - 2-3 x daily - 5-7 x weekly - 3 sets - 10 reps  ASSESSMENT:  CLINICAL IMPRESSION: Pt presents to PT with no L hip pain. Today's session began with stretching of pt's hamstrings, glutes and piriformis to improve her LE flexibility. Exercises afterwards focused on improving pt's hip and core strength/stability. Pt requires frequent cues throughout exercises for proper core engagement and to remember to keep breathing with core activation. Pt continues to demonstrate increased LE fatigue with sit-to-stands and standing strengthening exercises. Pt's SLR has improved and she is able to complete it with no pain and symmetrical range as compared to the R side. Pt will benefit from skilled PT interventions to address listed impairments to improve overall mobility and reduce pain during ADLs and walking.   OBJECTIVE IMPAIRMENTS: Abnormal gait, cardiopulmonary status limiting activity, decreased activity tolerance, decreased balance, decreased endurance, decreased mobility, difficulty walking, decreased strength, improper body mechanics, postural dysfunction, and pain.   ACTIVITY LIMITATIONS: bending, standing, squatting, sleeping, stairs, transfers, and locomotion level  PARTICIPATION  LIMITATIONS: cleaning, laundry, shopping, and community activity  PERSONAL FACTORS: Age, Behavior pattern, Fitness, and Past/current experiences are also affecting patient's functional outcome.   REHAB POTENTIAL: Good  CLINICAL DECISION MAKING: Stable/uncomplicated  EVALUATION COMPLEXITY: Low   GOALS: Goals reviewed with patient? Yes  SHORT TERM GOALS: Target date: 08/15/2023  Patient will be independent in HEP to improve strength/mobility for better functional independence with ADLs. Baseline: 8/29: HEP initiated Goal status: Partially met  LONG TERM GOALS: Target date: 09/12/2023  Patient will increase FOTO score to equal to or greater than 48 to demonstrate statistically significant improvement in mobility and quality of life.  Baseline: 8/29: 31 Goal status: INITIAL  2.  Patient (> 27 years old) will complete five times sit to stand test in < 15 seconds indicating an increased LE strength and improved balance. Baseline: 8/29: 42.11 seconds Goal status: INITIAL  3.  Patient will increase six minute walk test distance by 200' for progression to community ambulator and improve gait ability. Baseline: 8/29: to be tested on 2nd visit Goal status: INITIAL  4.  Patient will increase BLE gross strength to 4+/5 as to improve functional strength for independent gait, increased standing tolerance and increased ADL ability. Baseline: 8/29: see above  Goal status: INITIAL  5.  Patient will increase Dynamic Gait Index (DGI) score to >19/24 as to demonstrate reduced fall risk and improved dynamic gait balance for better safety with community/home ambulation.  Baseline: 8/29: to be tested on 2nd visit  Goal status: INITIAL   PLAN:  PT FREQUENCY: 1-2x/week  PT DURATION: 8 weeks  PLANNED INTERVENTIONS: Therapeutic exercises, Therapeutic activity, Neuromuscular re-education, Balance training, Gait training, Patient/Family education, Self Care, Joint mobilization, Stair training, DME  instructions, Aquatic Therapy, Dry Needling, Spinal mobilization, Cryotherapy, Moist heat, and Manual therapy.  PLAN FOR NEXT SESSION: Teach pt how to get up from falls, progress core/hip strengthening exercises, LE/trunk stretching  Cammie Mcgee, PT, DPT # [phone] Cena Benton, SPT 08/19/23, 12:09 PM

## 2023-08-20 DIAGNOSIS — J301 Allergic rhinitis due to pollen: Secondary | ICD-10-CM | POA: Diagnosis not present

## 2023-08-21 ENCOUNTER — Encounter: Payer: Self-pay | Admitting: Physical Therapy

## 2023-08-21 ENCOUNTER — Ambulatory Visit: Payer: Medicare HMO | Attending: Internal Medicine | Admitting: Physical Therapy

## 2023-08-21 DIAGNOSIS — R262 Difficulty in walking, not elsewhere classified: Secondary | ICD-10-CM | POA: Diagnosis present

## 2023-08-21 DIAGNOSIS — M6281 Muscle weakness (generalized): Secondary | ICD-10-CM | POA: Diagnosis present

## 2023-08-21 DIAGNOSIS — M5459 Other low back pain: Secondary | ICD-10-CM | POA: Insufficient documentation

## 2023-08-21 NOTE — Therapy (Signed)
OUTPATIENT PHYSICAL THERAPY THORACOLUMBAR/LOWER EXTREMITY TREATMENT   Patient Name: Erin Camacho MRN: [id number] DOB:12/10/1945, 77 y.o., female Today's Date: 08/21/2023  END OF SESSION:  PT End of Session - 08/21/23 1114     Visit Number 9    Number of Visits 17    Date for PT Re-Evaluation 09/12/23    PT Start Time 1115    PT Stop Time 1159    PT Time Calculation (min) 44 min    Activity Tolerance Patient tolerated treatment well    Behavior During Therapy [facility] for tasks assessed/performed              Past Medical History:  Diagnosis Date   Allergy    hay fever   Anxiety    Arthritis    Chronic back pain    Diabetes mellitus    Dyspnea    a. 07/2017 Echo: EF 60-65%. No rwma. Mildly dil LA. Nl RV fxn.   GERD (gastroesophageal reflux disease)    History of hiatal hernia    Hyperlipidemia    Hypertension    Osteoarthritis    Pneumonia    Spinal stenosis    Type 2 diabetes mellitus (HCC)    UTI (lower urinary tract infection)    Past Surgical History:  Procedure Laterality Date   ABDOMINAL HYSTERECTOMY  1973   ABDOMINAL HYSTERECTOMY     APPENDECTOMY     APPLICATION OF ROBOTIC ASSISTANCE FOR SPINAL PROCEDURE N/A 09/15/2018   Procedure: APPLICATION OF ROBOTIC ASSISTANCE FOR SPINAL PROCEDURE;  Surgeon: Barnett Abu, MD;  Location: [facility] OR;  Service: Neurosurgery;  Laterality: N/A;   BACK SURGERY  04/17/13, 2017, 2019   CATARACT EXTRACTION W/PHACO Left 12/16/2019   Procedure: CATARACT EXTRACTION PHACO AND INTRAOCULAR LENS PLACEMENT (IOC) LEFT PANOPTIX LENS DIABETIC 8.07 01:19.2 10.2%;  Surgeon: Lockie Mola, MD;  Location: [facility];  Service: Ophthalmology;  Laterality: Left;  Diabetic - oral meds   CATARACT EXTRACTION W/PHACO Right 01/06/2020   Procedure: CATARACT EXTRACTION PHACO AND INTRAOCULAR LENS PLACEMENT (IOC) PANOPTIX LENS RIGHT DIABETIC 10.73 01:08.6 15.7%;  Surgeon: Lockie Mola, MD;  Location: [facility];  Service:  Ophthalmology;  Laterality: Right;  diabetic   CHOLECYSTECTOMY  1995   COLONOSCOPY WITH PROPOFOL N/A 03/15/2021   Procedure: COLONOSCOPY WITH PROPOFOL;  Surgeon: Earline Mayotte, MD;  Location: [facility];  Service: Endoscopy;  Laterality: N/A;   ESOPHAGOGASTRODUODENOSCOPY N/A 06/20/2022   Procedure: ESOPHAGOGASTRODUODENOSCOPY (EGD);  Surgeon: Tol[place], Boykin Nearing, MD;  Location: [facility];  Service: Gastroenterology;  Laterality: N/A;   EYE SURGERY  2021   bilateral cataract removal with lens implants   HERNIA REPAIR  08/2006   lap band  08/2003   LUMBAR LAMINECTOMY/ DECOMPRESSION WITH MET-RX Left 12/29/2020   Procedure: Left Thoracic Seven-Eight Microdiscectomy;  Surgeon: Barnett Abu, MD;  Location: [facility];  Service: Neurosurgery;  Laterality: Left;   SEPTOPLASTY  04/1978   TONSILLECTOMY AND ADENOIDECTOMY  1979   TOTAL HIP ARTHROPLASTY  02/2012   Right, Dr. Ernest Pine   TOTAL HIP ARTHROPLASTY Left 08/31/2019   Procedure: TOTAL HIP ARTHROPLASTY;  Surgeon: Donato Heinz, MD;  Location: [facility] ORS;  Service: Orthopedics;  Laterality: Left;   UPPER GI ENDOSCOPY  06/20/2022   Patient Active Problem List   Diagnosis Date Noted   Trigger finger 07/03/2023   Leg pain 11/29/2022   Right leg numbness 08/20/2022   Memory change 08/17/2022   Bilateral carotid artery stenosis 01/31/2022   Balance problem 01/31/2022   History of COVID-19  11/25/2021   Fall 05/22/2021   Herniated nucleus pulposus with myelopathy, thoracic 12/29/2020   Low back pain 09/15/2020   Opioid use (10 MME/day) 05/16/2020   Hypokalemia 04/28/2020   Chronic pain syndrome 04/20/2020   Disorder of skeletal system 04/20/2020   Chronic thoracic spine pain (Bilateral) (R>L) 04/20/2020   Myofascial pain syndrome of thoracic spine 04/20/2020   Chronic musculoskeletal pain 04/20/2020   Spasm of thoracic back muscle 04/20/2020   Weight loss 12/31/2019   History of total hip replacement (Left) 08/31/2019   Early satiety  07/05/2019   Lumbar stenosis with neurogenic claudication 09/15/2018   Shortness of breath 06/19/2018   Cough 04/13/2018   Aortic atherosclerosis (HCC) 01/11/2018   GERD (gastroesophageal reflux disease) 12/23/2016   Atrophic vaginitis 03/18/2016   Spondylolisthesis at L5-S1 level 12/06/2015   Rhinitis, allergic 11/22/2015   Right leg pain 07/27/2014   Neuritis or radiculitis due to rupture of lumbar intervertebral disc 06/28/2014   Chronic shoulder pain (Bilateral) 02/24/2014   History of bariatric surgery 01/18/2014   Status post bariatric surgery 01/18/2014   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   Depression, major, single episode, mild (HCC) 05/14/2013   Major depressive disorder, single episode 05/14/2013   Healthcare maintenance 01/07/2013   Hypercholesterolemia 08/22/2012   Hyperlipidemia 08/22/2012   Hypertension 07/07/2012   Diabetes mellitus type 2, diet-controlled (HCC) 07/07/2012   Insomnia 07/07/2012   Obesity (BMI 30-39.9) 07/07/2012   Essential (primary) hypertension 07/07/2012   Chronic low back pain (1ry area of Pain) (Bilateral) (R>L) w/o sciatica 10/18/2011    PCP: Dale [place], MD  REFERRING PROVIDER: Dale Tiro, MD  REFERRING DIAG: M79.604,M79.605 (ICD-10-CM) - Pain in both lower extremities  Rationale for Evaluation and Treatment: Rehabilitation  THERAPY DIAG:  Difficulty in walking, not elsewhere classified  Muscle weakness (generalized)  Other low back pain  ONSET DATE: after 2013 (after R hip replacement)  SUBJECTIVE:                                                                                                                                                                                           SUBJECTIVE STATEMENT: Patient reports BLE weakness and numbness. Numbness is constant on anterolateral thighs but gets worse with standing >10 minutes. Patient unable to ambulate her block around her home. Difficulty with stairs  (step to pattern). Patient has had 4 back surgeries - L1-S1 fusion and "rods" T8/9-L1 starting in 2014. Has had L and R hip replacements.   PERTINENT HISTORY:  Patient has been seeing Dr. Danielle Dess for her back with radicular pain. Has had a fall 4-5 months ago and hurt her L  knee. Per Dr. Roby Lofts note from 07/05/23, patient reports weakness in upper thighs and limited in walking due to her legs. Dr. Lorin Picket discussed with patient about utilizing a walker to help with patient reporting she does better in stores with a cart she can lean on.   PAIN:  Are you having pain? Yes: NPRS scale: 4-5/10 at worst  Pain location: B legs  Pain description: restless Aggravating factors: laying down, standing for prolonged periods of time  Relieving factors: movement, medication   PRECAUTIONS: Fall  WEIGHT BEARING RESTRICTIONS: No  FALLS:  Has patient fallen in last 6 months? Yes. Number of falls 1  LIVING ENVIRONMENT:   Lives with: lives with their spouse Lives in: House/apartment Stairs: No Has following equipment at home: Single point cane and Environmental consultant - 4 wheeled  OCCUPATION: retired   PLOF: Independent  PATIENT GOALS: to be able to walk a distance and not hurt   NEXT MD VISIT: 09/2023 (PCP)  OBJECTIVE:   DIAGNOSTIC FINDINGS:  N/A - unable to obtain due to outside provider   PATIENT SURVEYS:  FOTO 31 with goal of 48  SCREENING FOR RED FLAGS: Bowel or bladder incontinence: Yes: at times, patient reports unaware of bowel movement until trip to the restroom.  No issues with bladder incontinence.  Spinal tumors: No Cauda equina syndrome: No Compression fracture: No Abdominal aneurysm: No  COGNITION: Overall cognitive status: Within functional limits for tasks assessed     SENSATION: Sensory testing WFL, however patient reports numbness on anterolateral parts of B thighs  POSTURE: rounded shoulders, forward head, and increased thoracic kyphosis  PALPATION: Nontender to lumbar and  thoracic paraspinals  Notable piriformis tightness bilaterally with FADIR  LUMBAR ROM:   AROM eval  Flexion 80%  Extension 50%  Right lateral flexion To mid thigh*  Left lateral flexion To mid thigh*  Right rotation 50% *  Left rotation 50% *   (Blank rows = not tested)   *=pain   LOWER EXTREMITY MMT:    MMT Right eval Left eval  Hip flexion 4- 4-  Hip extension    Hip abduction 4- 4-  Hip adduction 4- 4-  Hip internal rotation    Hip external rotation    Knee flexion 4 4  Knee extension 4 4  Ankle dorsiflexion 4- 4-   (Blank rows = not tested)  LUMBAR SPECIAL TESTS:  Straight leg raise test: Negative and FABER test: Positive; FADIR; positive on B LE   FUNCTIONAL TESTS:  5 times sit to stand: 42.11 sec 6 minute walk test: NT on eval Dynamic Gait Index: NT on eval   GAIT: Distance walked: 50' Assistive device utilized: None Level of assistance: Modified independence Comments: decreased stance time bilaterally, mild L/R lateral lean    TODAY'S TREATMENT:                                                                                                                           DATE:  08/21/23  Subjective: Pt reports no pain in her L hip today; she reports a little soreness in her back from last session but other than that feels fine.    Therex: Nustep L4 for 10 mins B UE/LE at end of session to improve cardiovascular endurance  Generalized LE stretch (hamstrings, piriformis, glutes) x 5 minutes at start of session   Deadbug Perturbations: 3x1 minute holds with 4# blue ball at chest  Standing Palloff press with Green TB: 2x15 each direction   Static Alternating Marches: 2 sets of 30 second holds - pt had hard time sequencing movements when walking so performed statically and pt did better  High knees in // bars with 3# AW: 3 laps, UE support as needed  Sidesteps in // bars with 3# AW: 3 laps, UE support as needed    Not today: Resisted Sidesteps in //  bars with green TB around knees: 4 laps each direction Standing 3-way hip with green TB around knees: 1x10 each leg TRX Squats: 2x15 with chair for cueing for squat depth Hooklying Bridge: 2x15 - mild low back pain at start of first set, improved with repetition SLR: x10 each side - much improved since initial evaluation; no pain and pt demonstrates symmetrical ROM   PATIENT EDUCATION:  Education details: HEP, POC, goals Person educated: Patient Education method: Explanation, Demonstration, and Handouts Education comprehension: verbalized understanding and returned demonstration  HOME EXERCISE PROGRAM: Access Code: [email or web address] URL: [email or web address] Date: 07/18/2023 Prepared by: Maylon Peppers  Exercises - Standing Hip Abduction with Counter Support  - 2-3 x daily - 5-7 x weekly - 3 sets - 10 reps - Standing March with Counter Support  - 2-3 x daily - 5-7 x weekly - 3 sets - 10 reps - Heel Raises with Counter Support  - 2-3 x daily - 5-7 x weekly - 3 sets - 10 reps - Sit to Stand with Counter Support  - 2-3 x daily - 5-7 x weekly - 3 sets - 10 reps  ASSESSMENT:  CLINICAL IMPRESSION: Pt presents to PT with no L hip pain. Today's session began with stretching of pt's hamstrings, glutes, piriformis in order to improve pt's LE flexibility and decrease stiffness. Exercises today focused on improving pt's core strength and engagement throughout various functional exercises, such as marching, walking, side-stepping, reaching. Pt reports no pain with the exercises, just mild core fatigue particularly with sta nding palloff press. Walking marches trialed today but pt struggled with sequencing so it was regressed to [employer], with which the pt had an easier time with. Pt reports improvement in L hip flexor pain since initial evaluation. Pt will benefit from skilled PT interventions to address listed impairments to improve overall mobility and reduce pain during ADLs and walking.    OBJECTIVE IMPAIRMENTS: Abnormal gait, cardiopulmonary status limiting activity, decreased activity tolerance, decreased balance, decreased endurance, decreased mobility, difficulty walking, decreased strength, improper body mechanics, postural dysfunction, and pain.   ACTIVITY LIMITATIONS: bending, standing, squatting, sleeping, stairs, transfers, and locomotion level  PARTICIPATION LIMITATIONS: cleaning, laundry, shopping, and community activity  PERSONAL FACTORS: Age, Behavior pattern, Fitness, and Past/current experiences are also affecting patient's functional outcome.   REHAB POTENTIAL: Good  CLINICAL DECISION MAKING: Stable/uncomplicated  EVALUATION COMPLEXITY: Low   GOALS: Goals reviewed with patient? Yes  SHORT TERM GOALS: Target date: 08/15/2023  Patient will be independent in HEP to improve strength/mobility for better functional independence with ADLs. Baseline: 8/29: HEP initiated Goal status: Partially met  LONG TERM GOALS: Target  date: 09/12/2023  Patient will increase FOTO score to equal to or greater than 48 to demonstrate statistically significant improvement in mobility and quality of life.  Baseline: 8/29: 31 Goal status: INITIAL  2.  Patient (> 72 years old) will complete five times sit to stand test in < 15 seconds indicating an increased LE strength and improved balance. Baseline: 8/29: 42.11 seconds Goal status: INITIAL  3.  Patient will increase six minute walk test distance by 200' for progression to community ambulator and improve gait ability. Baseline: 8/29: to be tested on 2nd visit Goal status: INITIAL  4.  Patient will increase BLE gross strength to 4+/5 as to improve functional strength for independent gait, increased standing tolerance and increased ADL ability. Baseline: 8/29: see above  Goal status: INITIAL  5.  Patient will increase Dynamic Gait Index (DGI) score to >19/24 as to demonstrate reduced fall risk and improved dynamic gait  balance for better safety with community/home ambulation.  Baseline: 8/29: to be tested on 2nd visit  Goal status: INITIAL   PLAN:  PT FREQUENCY: 1-2x/week  PT DURATION: 8 weeks  PLANNED INTERVENTIONS: Therapeutic exercises, Therapeutic activity, Neuromuscular re-education, Balance training, Gait training, Patient/Family education, Self Care, Joint mobilization, Stair training, DME instructions, Aquatic Therapy, Dry Needling, Spinal mobilization, Cryotherapy, Moist heat, and Manual therapy.  PLAN FOR NEXT SESSION: Teach pt how to get up from falls with no chair support, progress core/hip strengthening exercises, LE/trunk stretching.  Reassess LTGs  Cammie Mcgee, PT, DPT # [phone] Cena Benton, SPT 08/21/23, 1:16 PM

## 2023-08-22 ENCOUNTER — Ambulatory Visit (INDEPENDENT_AMBULATORY_CARE_PROVIDER_SITE_OTHER): Payer: Medicare HMO

## 2023-08-22 DIAGNOSIS — Z23 Encounter for immunization: Secondary | ICD-10-CM

## 2023-08-26 ENCOUNTER — Ambulatory Visit: Payer: Medicare HMO | Admitting: Physical Therapy

## 2023-08-26 DIAGNOSIS — Z872 Personal history of diseases of the skin and subcutaneous tissue: Secondary | ICD-10-CM | POA: Diagnosis not present

## 2023-08-26 DIAGNOSIS — L578 Other skin changes due to chronic exposure to nonionizing radiation: Secondary | ICD-10-CM | POA: Diagnosis not present

## 2023-08-26 DIAGNOSIS — L570 Actinic keratosis: Secondary | ICD-10-CM | POA: Diagnosis not present

## 2023-08-27 DIAGNOSIS — J301 Allergic rhinitis due to pollen: Secondary | ICD-10-CM | POA: Diagnosis not present

## 2023-08-28 ENCOUNTER — Ambulatory Visit: Payer: Medicare HMO | Admitting: Physical Therapy

## 2023-08-28 DIAGNOSIS — M5459 Other low back pain: Secondary | ICD-10-CM | POA: Diagnosis not present

## 2023-08-28 DIAGNOSIS — M6281 Muscle weakness (generalized): Secondary | ICD-10-CM

## 2023-08-28 DIAGNOSIS — R262 Difficulty in walking, not elsewhere classified: Secondary | ICD-10-CM

## 2023-08-28 NOTE — Therapy (Signed)
OUTPATIENT PHYSICAL THERAPY THORACOLUMBAR/LOWER EXTREMITY TREATMENT/PROGRESS NOTE 07/18/2023 - 08/28/2023   Patient Name: Erin Camacho MRN: [id number] DOB:[birth year], 77 y.o., female Today's Date: 08/28/2023  END OF SESSION:  PT End of Session - 08/28/23 1304     Visit Number 10    Number of Visits 17    Date for PT Re-Evaluation 09/12/23    PT Start Time 1300    PT Stop Time 1348    PT Time Calculation (min) 48 min    Equipment Utilized During Treatment Gait belt    Activity Tolerance Patient tolerated treatment well    Behavior During Therapy WFL for tasks assessed/performed               Past Medical History:  Diagnosis Date   Allergy    hay fever   Anxiety    Arthritis    Chronic back pain    Diabetes mellitus    Dyspnea    a. 07/2017 Echo: EF 60-65%. No rwma. Mildly dil LA. Nl RV fxn.   GERD (gastroesophageal reflux disease)    History of hiatal hernia    Hyperlipidemia    Hypertension    Osteoarthritis    Pneumonia    Spinal stenosis    Type 2 diabetes mellitus (HCC)    UTI (lower urinary tract infection)    Past Surgical History:  Procedure Laterality Date   ABDOMINAL HYSTERECTOMY  1973   ABDOMINAL HYSTERECTOMY     APPENDECTOMY     APPLICATION OF ROBOTIC ASSISTANCE FOR SPINAL PROCEDURE N/A 09/15/2018   Procedure: APPLICATION OF ROBOTIC ASSISTANCE FOR SPINAL PROCEDURE;  Surgeon: Barnett Abu, MD;  Location: [facility];  Service: Neurosurgery;  Laterality: N/A;   BACK SURGERY  04/17/13, 2017, 2019   CATARACT EXTRACTION W/PHACO Left 12/16/2019   Procedure: CATARACT EXTRACTION PHACO AND INTRAOCULAR LENS PLACEMENT (IOC) LEFT PANOPTIX LENS DIABETIC 8.07 01:19.2 10.2%;  Surgeon: Lockie Mola, MD;  Location: [facility];  Service: Ophthalmology;  Laterality: Left;  Diabetic - oral meds   CATARACT EXTRACTION W/PHACO Right 01/06/2020   Procedure: CATARACT EXTRACTION PHACO AND INTRAOCULAR LENS PLACEMENT (IOC) PANOPTIX LENS RIGHT DIABETIC 10.73 01:08.6  15.7%;  Surgeon: Lockie Mola, MD;  Location: [facility];  Service: Ophthalmology;  Laterality: Right;  diabetic   CHOLECYSTECTOMY  1995   COLONOSCOPY WITH PROPOFOL N/A 03/15/2021   Procedure: COLONOSCOPY WITH PROPOFOL;  Surgeon: Earline Mayotte, MD;  Location: [facility];  Service: Endoscopy;  Laterality: N/A;   ESOPHAGOGASTRODUODENOSCOPY N/A 06/20/2022   Procedure: ESOPHAGOGASTRODUODENOSCOPY (EGD);  Surgeon: Toledo, Boykin Nearing, MD;  Location: [facility];  Service: Gastroenterology;  Laterality: N/A;   EYE SURGERY  2021   bilateral cataract removal with lens implants   HERNIA REPAIR  08/2006   lap band  08/2003   LUMBAR LAMINECTOMY/ DECOMPRESSION WITH MET-RX Left 12/29/2020   Procedure: Left Thoracic Seven-Eight Microdiscectomy;  Surgeon: Barnett Abu, MD;  Location: [facility] OR;  Service: Neurosurgery;  Laterality: Left;   SEPTOPLASTY  04/1978   TONSILLECTOMY AND ADENOIDECTOMY  1979   TOTAL HIP ARTHROPLASTY  02/2012   Right, Dr. Ernest Pine   TOTAL HIP ARTHROPLASTY Left 08/31/2019   Procedure: TOTAL HIP ARTHROPLASTY;  Surgeon: Donato Heinz, MD;  Location: [facility];  Service: Orthopedics;  Laterality: Left;   UPPER GI ENDOSCOPY  06/20/2022   Patient Active Problem List   Diagnosis Date Noted   Trigger finger 07/03/2023   Leg pain 11/29/2022   Right leg numbness 08/20/2022   Memory change 08/17/2022   Bilateral  carotid artery stenosis 01/31/2022   Balance problem 01/31/2022   History of COVID-19 11/25/2021   Fall 05/22/2021   Herniated nucleus pulposus with myelopathy, thoracic 12/29/2020   Low back pain 09/15/2020   Opioid use (10 MME/day) 05/16/2020   Hypokalemia 04/28/2020   Chronic pain syndrome 04/20/2020   Disorder of skeletal system 04/20/2020   Chronic thoracic spine pain (Bilateral) (R>L) 04/20/2020   Myofascial pain syndrome of thoracic spine 04/20/2020   Chronic musculoskeletal pain 04/20/2020   Spasm of thoracic back muscle 04/20/2020   Weight  loss 12/31/2019   History of total hip replacement (Left) 08/31/2019   Early satiety 07/05/2019   Lumbar stenosis with neurogenic claudication 09/15/2018   Shortness of breath 06/19/2018   Cough 04/13/2018   Aortic atherosclerosis (HCC) 01/11/2018   GERD (gastroesophageal reflux disease) 12/23/2016   Atrophic vaginitis 03/18/2016   Spondylolisthesis at L5-S1 level 12/06/2015   Rhinitis, allergic 11/22/2015   Right leg pain 07/27/2014   Neuritis or radiculitis due to rupture of lumbar intervertebral disc 06/28/2014   Chronic shoulder pain (Bilateral) 02/24/2014   History of bariatric surgery 01/18/2014   Status post bariatric surgery 01/18/2014   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   Depression, major, single episode, mild (HCC) 05/14/2013   Major depressive disorder, single episode 05/14/2013   Healthcare maintenance 01/07/2013   Hypercholesterolemia 08/22/2012   Hyperlipidemia 08/22/2012   Hypertension 07/07/2012   Diabetes mellitus type 2, diet-controlled (HCC) 07/07/2012   Insomnia 07/07/2012   Obesity (BMI 30-39.9) 07/07/2012   Essential (primary) hypertension 07/07/2012   Chronic low back pain (1ry area of Pain) (Bilateral) (R>L) w/o sciatica 10/18/2011    PCP: Dale Unadilla, MD  REFERRING PROVIDER: Dale , MD  REFERRING DIAG: M79.604,M79.605 (ICD-10-CM) - Pain in both lower extremities  Rationale for Evaluation and Treatment: Rehabilitation  THERAPY DIAG:  No diagnosis found.  ONSET DATE: after 2013 (after R hip replacement)  SUBJECTIVE:                                                                                                                                                                                           SUBJECTIVE STATEMENT: Patient reports BLE weakness and numbness. Numbness is constant on anterolateral thighs but gets worse with standing >10 minutes. Patient unable to ambulate her block around her home. Difficulty with stairs  (step to pattern). Patient has had 4 back surgeries - L1-S1 fusion and "rods" T8/9-L1 starting in 2014. Has had L and R hip replacements.   PERTINENT HISTORY:  Patient has been seeing Dr. Danielle Dess for her back with radicular pain. Has had a fall 4-5 months ago and hurt  her L knee. Per Dr. Roby Lofts note from 07/05/23, patient reports weakness in upper thighs and limited in walking due to her legs. Dr. Lorin Picket discussed with patient about utilizing a walker to help with patient reporting she does better in stores with a cart she can lean on.   PAIN:  Are you having pain? Yes: NPRS scale: 4-5/10 at worst  Pain location: B legs  Pain description: restless Aggravating factors: laying down, standing for prolonged periods of time  Relieving factors: movement, medication   PRECAUTIONS: Fall  WEIGHT BEARING RESTRICTIONS: No  FALLS:  Has patient fallen in last 6 months? Yes. Number of falls 1  LIVING ENVIRONMENT:   Lives with: lives with their spouse Lives in: House/apartment Stairs: No Has following equipment at home: Single point cane and Environmental consultant - 4 wheeled  OCCUPATION: retired   PLOF: Independent  PATIENT GOALS: to be able to walk a distance and not hurt   NEXT MD VISIT: 09/2023 (PCP)  OBJECTIVE:   DIAGNOSTIC FINDINGS:  N/A - unable to obtain due to outside provider   PATIENT SURVEYS:  FOTO 31 with goal of 48  SCREENING FOR RED FLAGS: Bowel or bladder incontinence: Yes: at times, patient reports unaware of bowel movement until trip to the restroom.  No issues with bladder incontinence.  Spinal tumors: No Cauda equina syndrome: No Compression fracture: No Abdominal aneurysm: No  COGNITION: Overall cognitive status: Within functional limits for tasks assessed     SENSATION: Sensory testing WFL, however patient reports numbness on anterolateral parts of B thighs  POSTURE: rounded shoulders, forward head, and increased thoracic kyphosis  PALPATION: Nontender to lumbar and  thoracic paraspinals  Notable piriformis tightness bilaterally with FADIR  LUMBAR ROM:   AROM eval  Flexion 80%  Extension 50%  Right lateral flexion To mid thigh*  Left lateral flexion To mid thigh*  Right rotation 50% *  Left rotation 50% *   (Blank rows = not tested)   *=pain   LOWER EXTREMITY MMT:    MMT Right eval Left eval  Hip flexion 4- 4-  Hip extension    Hip abduction 4- 4-  Hip adduction 4- 4-  Hip internal rotation    Hip external rotation    Knee flexion 4 4  Knee extension 4 4  Ankle dorsiflexion 4- 4-   (Blank rows = not tested)  LUMBAR SPECIAL TESTS:  Straight leg raise test: Negative and FABER test: Positive; FADIR; positive on B LE   FUNCTIONAL TESTS:  5 times sit to stand: 42.11 sec 6 minute walk test: NT on eval Dynamic Gait Index: NT on eval   GAIT: Distance walked: 50' Assistive device utilized: None Level of assistance: Modified independence Comments: decreased stance time bilaterally, mild L/R lateral lean    TODAY'S TREATMENT:  DATE: 08/28/23  Subjective: Pt reports no pain in her L hip today, however says her back is bothering her a little due to being busy over the past few days. Pt describes it as a "sore" pain (3/10). Pt says she has not been able to do her HEP recently due to being so busy at home. Pt reports still experiencing some numbness in both legs around her upper thighs, she says she has an appointment later this month (October 29th) to determine if she has any nerve damage in her legs.    Therex: Nustep L4 for 10 mins B UE/LE at start of session to improve cardiovascular endurance and provide warm-up; MH applied to back to help with back pain  FOTO: 33 today  MMT:   MMT Right 08/28/2023 Left 08/28/2023  Hip flexion 4 4  Hip abduction 4- 4-  Hip adduction    Hip internal rotation 4- 4-  Hip  external rotation 4 4  Knee flexion 5 5  Knee extension 5 5  Ankle dorsiflexion 5 5  Ankle plantarflexion    Ankle inversion    Ankle eversion    (Blank rows = not tested)  : 639 feet with no AD, 1 minute seated rest break after 3.5 minutes - HR 83 bpm, O2 98% immediately post-6MWT  DGI: 20/24 (above cutoff for fall-risk)  5XSTS: 34.5 seconds (with use of UE's on knees)      Not today: Generalized LE stretch (hamstrings, piriformis, glutes) x 5 minutes at start of session  Deadbug Perturbations: 3x1 minute holds with 4# blue ball at chest Standing Palloff press with [place] Si TB: 2x15 each direction  Static Alternating Marches: 2 sets of 30 second holds - pt had hard time sequencing movements when walking so performed statically and pt did better High knees in // bars with 3# AW: 3 laps, UE support as needed Sidesteps in // bars with 3# AW: 3 laps, UE support as needed Resisted Sidesteps in // bars with green TB around knees: 4 laps each direction Standing 3-way hip with green TB around knees: 1x10 each leg TRX Squats: 2x15 with chair for cueing for squat depth Hooklying Bridge: 2x15 - mild low back pain at start of first set, improved with repetition SLR: x10 each side - much improved since initial evaluation; no pain and pt demonstrates symmetrical ROM   PATIENT EDUCATION:  Education details: HEP, POC, goals Person educated: Patient Education method: Explanation, Demonstration, and Handouts Education comprehension: verbalized understanding and returned demonstration  HOME EXERCISE PROGRAM: Access Code: [email or web address] URL: [email or web address] Date: 07/18/2023 Prepared by: Maylon Peppers  Exercises - Standing Hip Abduction with Counter Support  - 2-3 x daily - 5-7 x weekly - 3 sets - 10 reps - Standing March with Counter Support  - 2-3 x daily - 5-7 x weekly - 3 sets - 10 reps - Heel Raises with Counter Support  - 2-3 x daily - 5-7 x weekly - 3 sets - 10  reps - Sit to Stand with Counter Support  - 2-3 x daily - 5-7 x weekly - 3 sets - 10 reps  ASSESSMENT:  CLINICAL IMPRESSION: Pt presents to PT for progress note. Pt demonstrated improvement in knee extensor and flexor strength, as well as ankle dorsiflexors. No significant changes noted in her hip strength bilaterally. Pt did show clinically significant improvement in 5XSTS test, however, she continued to require use of UE's on the chair armrests to assist with each stand. Pt is now above the cutoff  score for increased fall risk based on DGI scoring. Pt demonstrated regression in mainly secondary to requiring a 1-minute seated rest break in the middle of the test. Her feelings of increased fatigue are likely due to performing the test towards the end of the session as opposed to the beginning. will be performed again at the start of next session to determine if the patient's cardiovascular endurance has shown any significant changes since the initial evaluation. Goals updated to reflect pt's progress. Pt will benefit from skilled PT interventions to address listed impairments to improve overall mobility and reduce pain during ADLs and walking.   OBJECTIVE IMPAIRMENTS: Abnormal gait, cardiopulmonary status limiting activity, decreased activity tolerance, decreased balance, decreased endurance, decreased mobility, difficulty walking, decreased strength, improper body mechanics, postural dysfunction, and pain.   ACTIVITY LIMITATIONS: bending, standing, squatting, sleeping, stairs, transfers, and locomotion level  PARTICIPATION LIMITATIONS: cleaning, laundry, shopping, and community activity  PERSONAL FACTORS: Age, Behavior pattern, Fitness, and Past/current experiences are also affecting patient's functional outcome.   REHAB POTENTIAL: Good  CLINICAL DECISION MAKING: Stable/uncomplicated  EVALUATION COMPLEXITY: Low   GOALS: Goals reviewed with patient? Yes  SHORT TERM GOALS: Target  date: 08/15/2023  Patient will be independent in HEP to improve strength/mobility for better functional independence with ADLs. Baseline: 8/29: HEP initiated Goal status: Partially met  LONG TERM GOALS: Target date: 09/12/2023  Patient will increase FOTO score to equal to or greater than 48 to demonstrate statistically significant improvement in mobility and quality of life.  Baseline: 8/29: 31, 10/9: 33 Goal status: Not met, progressing  2.  Patient (> 60 years old) will complete five times sit to stand test in < 15 seconds indicating an increased LE strength and improved balance. Baseline: 8/29: 42.11 seconds (with use of hands), 10/9: 34.5 seconds (with use of hands) Goal status: Not met, progressing  3.  Patient will increase six minute walk test distance by 200' for progression to community ambulator and improve gait ability. Baseline: 990 feet, 10/9: 639 feet (1 min seated rest break) Goal status: Not met, to be reassessed next session  4.  Patient will increase BLE gross strength to 4+/5 as to improve functional strength for independent gait, increased standing tolerance and increased ADL ability. Baseline: 8/29: see above, 10/9: see above for updated MMT scores Goal status: Not met, progressing  5.  Patient will increase Dynamic Gait Index (DGI) score to >19/24 as to demonstrate reduced fall risk and improved dynamic gait balance for better safety with community/home ambulation.  Baseline: 15/24, 10/9: 20/24 Goal status: GOAL MET   PLAN:  PT FREQUENCY: 1-2x/week  PT DURATION: 8 weeks  PLANNED INTERVENTIONS: Therapeutic exercises, Therapeutic activity, Neuromuscular re-education, Balance training, Gait training, Patient/Family education, Self Care, Joint mobilization, Stair training, DME instructions, Aquatic Therapy, Dry Needling, Spinal mobilization, Cryotherapy, Moist heat, and Manual therapy.  PLAN FOR NEXT SESSION: Teach pt how to get up from falls with no chair  support, progress core/hip strengthening exercises, LE/trunk stretching.  Reassess at beginning of session  Brittni Hult, SPT 08/28/23, 2:08 PM

## 2023-08-30 ENCOUNTER — Encounter: Payer: Self-pay | Admitting: Physical Therapy

## 2023-08-30 ENCOUNTER — Ambulatory Visit: Payer: Medicare HMO | Admitting: Physical Therapy

## 2023-08-30 DIAGNOSIS — R262 Difficulty in walking, not elsewhere classified: Secondary | ICD-10-CM

## 2023-08-30 DIAGNOSIS — M5459 Other low back pain: Secondary | ICD-10-CM | POA: Diagnosis not present

## 2023-08-30 DIAGNOSIS — M6281 Muscle weakness (generalized): Secondary | ICD-10-CM | POA: Diagnosis not present

## 2023-08-30 NOTE — Therapy (Signed)
OUTPATIENT PHYSICAL THERAPY THORACOLUMBAR/LOWER EXTREMITY TREATMENT   Patient Name: Erin Camacho MRN: [id number] DOB:06-29-1946, 77 y.o., female Today's Date: 08/30/2023  END OF SESSION:  PT End of Session - 08/30/23 1435     Visit Number 11    Number of Visits 17    Date for PT Re-Evaluation 09/12/23    PT Start Time 1429    Equipment Utilized During Treatment Gait belt    Activity Tolerance Patient tolerated treatment well    Behavior During Therapy [facility] for tasks assessed/performed              Past Medical History:  Diagnosis Date   Allergy    hay fever   Anxiety    Arthritis    Chronic back pain    Diabetes mellitus    Dyspnea    a. 07/2017 Echo: EF 60-65%. No rwma. Mildly dil LA. Nl RV fxn.   GERD (gastroesophageal reflux disease)    History of hiatal hernia    Hyperlipidemia    Hypertension    Osteoarthritis    Pneumonia    Spinal stenosis    Type 2 diabetes mellitus (HCC)    UTI (lower urinary tract infection)    Past Surgical History:  Procedure Laterality Date   ABDOMINAL HYSTERECTOMY  1973   ABDOMINAL HYSTERECTOMY     APPENDECTOMY     APPLICATION OF ROBOTIC ASSISTANCE FOR SPINAL PROCEDURE N/A 09/15/2018   Procedure: APPLICATION OF ROBOTIC ASSISTANCE FOR SPINAL PROCEDURE;  Surgeon: Barnett Abu, MD;  Location: [facility] OR;  Service: Neurosurgery;  Laterality: N/A;   BACK SURGERY  04/17/13, 2017, 2019   CATARACT EXTRACTION W/PHACO Left 12/16/2019   Procedure: CATARACT EXTRACTION PHACO AND INTRAOCULAR LENS PLACEMENT (IOC) LEFT PANOPTIX LENS DIABETIC 8.07 01:19.2 10.2%;  Surgeon: Lockie Mola, MD;  Location: [facility];  Service: Ophthalmology;  Laterality: Left;  Diabetic - oral meds   CATARACT EXTRACTION W/PHACO Right 01/06/2020   Procedure: CATARACT EXTRACTION PHACO AND INTRAOCULAR LENS PLACEMENT (IOC) PANOPTIX LENS RIGHT DIABETIC 10.73 01:08.6 15.7%;  Surgeon: Lockie Mola, MD;  Location: [facility];  Service:  Ophthalmology;  Laterality: Right;  diabetic   CHOLECYSTECTOMY  1995   COLONOSCOPY WITH PROPOFOL N/A 03/15/2021   Procedure: COLONOSCOPY WITH PROPOFOL;  Surgeon: Earline Mayotte, MD;  Location: [facility];  Service: Endoscopy;  Laterality: N/A;   ESOPHAGOGASTRODUODENOSCOPY N/A 06/20/2022   Procedure: ESOPHAGOGASTRODUODENOSCOPY (EGD);  Surgeon: Toledo, Boykin Nearing, MD;  Location: [facility] END[facility];  Service: Gastroenterology;  Laterality: N/A;   EYE SURGERY  2021   bilateral cataract removal with lens implants   HERNIA REPAIR  08/2006   lap band  08/2003   LUMBAR LAMINECTOMY/ DECOMPRESSION WITH MET-RX Left 12/29/2020   Procedure: Left Thoracic Seven-Eight Microdiscectomy;  Surgeon: Barnett Abu, MD;  Location: [facility] OR;  Service: Neurosurgery;  Laterality: Left;   SEPTOPLASTY  04/1978   TONSILLECTOMY AND ADENOIDECTOMY  1979   TOTAL HIP ARTHROPLASTY  02/2012   Right, Dr. Ernest Pine   TOTAL HIP ARTHROPLASTY Left 08/31/2019   Procedure: TOTAL HIP ARTHROPLASTY;  Surgeon: Donato Heinz, MD;  Location: [facility] ORS;  Service: Orthopedics;  Laterality: Left;   UPPER GI ENDOSCOPY  06/20/2022   Patient Active Problem List   Diagnosis Date Noted   Trigger finger 07/03/2023   Leg pain 11/29/2022   Right leg numbness 08/20/2022   Memory change 08/17/2022   Bilateral carotid artery stenosis 01/31/2022   Balance problem 01/31/2022   History of COVID-19 11/25/2021   Fall 05/22/2021  Herniated nucleus pulposus with myelopathy, thoracic 12/29/2020   Low back pain 09/15/2020   Opioid use (10 MME/day) 05/16/2020   Hypokalemia 04/28/2020   Chronic pain syndrome 04/20/2020   Disorder of skeletal system 04/20/2020   Chronic thoracic spine pain (Bilateral) (R>L) 04/20/2020   Myofascial pain syndrome of thoracic spine 04/20/2020   Chronic musculoskeletal pain 04/20/2020   Spasm of thoracic back muscle 04/20/2020   Weight loss 12/31/2019   History of total hip replacement (Left) 08/31/2019   Early satiety  07/05/2019   Lumbar stenosis with neurogenic claudication 09/15/2018   Shortness of breath 06/19/2018   Cough 04/13/2018   Aortic atherosclerosis (HCC) 01/11/2018   GERD (gastroesophageal reflux disease) 12/23/2016   Atrophic vaginitis 03/18/2016   Spondylolisthesis at L5-S1 level 12/06/2015   Rhinitis, allergic 11/22/2015   Right leg pain 07/27/2014   Neuritis or radiculitis due to rupture of lumbar intervertebral disc 06/28/2014   Chronic shoulder pain (Bilateral) 02/24/2014   History of bariatric surgery 01/18/2014   Status post bariatric surgery 01/18/2014   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   Depression, major, single episode, mild (HCC) 05/14/2013   Major depressive disorder, single episode 05/14/2013   Healthcare maintenance 01/07/2013   Hypercholesterolemia 08/22/2012   Hyperlipidemia 08/22/2012   Hypertension 07/07/2012   Diabetes mellitus type 2, diet-controlled (HCC) 07/07/2012   Insomnia 07/07/2012   Obesity (BMI 30-39.9) 07/07/2012   Essential (primary) hypertension 07/07/2012   Chronic low back pain (1ry area of Pain) (Bilateral) (R>L) w/o sciatica 10/18/2011    PCP: Dale Sunray, MD  REFERRING PROVIDER: Dale Kinmundy, MD  REFERRING DIAG: M79.604,M79.605 (ICD-10-CM) - Pain in both lower extremities  Rationale for Evaluation and Treatment: Rehabilitation  THERAPY DIAG:  Difficulty in walking, not elsewhere classified  Muscle weakness (generalized)  Other low back pain  ONSET DATE: after 2013 (after R hip replacement)  SUBJECTIVE:                                                                                                                                                                                           SUBJECTIVE STATEMENT: Patient reports BLE weakness and numbness. Numbness is constant on anterolateral thighs but gets worse with standing >10 minutes. Patient unable to ambulate her block around her home. Difficulty with stairs  (step to pattern). Patient has had 4 back surgeries - L1-S1 fusion and "rods" T8/9-L1 starting in 2014. Has had L and R hip replacements.   PERTINENT HISTORY:  Patient has been seeing Dr. Danielle Dess for her back with radicular pain. Has had a fall 4-5 months ago and hurt her L knee. Per Dr. Roby Lofts note from 07/05/23,  patient reports weakness in upper thighs and limited in walking due to her legs. Dr. Lorin Picket discussed with patient about utilizing a walker to help with patient reporting she does better in stores with a cart she can lean on.   PAIN:  Are you having pain? Yes: NPRS scale: 4-5/10 at worst  Pain location: B legs  Pain description: restless Aggravating factors: laying down, standing for prolonged periods of time  Relieving factors: movement, medication   PRECAUTIONS: Fall  WEIGHT BEARING RESTRICTIONS: No  FALLS:  Has patient fallen in last 6 months? Yes. Number of falls 1  LIVING ENVIRONMENT:   Lives with: lives with their spouse Lives in: House/apartment Stairs: No Has following equipment at home: Single point cane and Environmental consultant - 4 wheeled  OCCUPATION: retired   PLOF: Independent  PATIENT GOALS: to be able to walk a distance and not hurt   NEXT MD VISIT: 09/2023 (PCP)  OBJECTIVE:   DIAGNOSTIC FINDINGS:  N/A - unable to obtain due to outside provider   PATIENT SURVEYS:  FOTO 31 with goal of 48  SCREENING FOR RED FLAGS: Bowel or bladder incontinence: Yes: at times, patient reports unaware of bowel movement until trip to the restroom.  No issues with bladder incontinence.  Spinal tumors: No Cauda equina syndrome: No Compression fracture: No Abdominal aneurysm: No  COGNITION: Overall cognitive status: Within functional limits for tasks assessed     SENSATION: Sensory testing WFL, however patient reports numbness on anterolateral parts of B thighs  POSTURE: rounded shoulders, forward head, and increased thoracic kyphosis  PALPATION: Nontender to lumbar and  thoracic paraspinals  Notable piriformis tightness bilaterally with FADIR  LUMBAR ROM:   AROM eval  Flexion 80%  Extension 50%  Right lateral flexion To mid thigh*  Left lateral flexion To mid thigh*  Right rotation 50% *  Left rotation 50% *   (Blank rows = not tested)   *=pain   LOWER EXTREMITY MMT:    MMT Right eval Left eval  Hip flexion 4- 4-  Hip extension    Hip abduction 4- 4-  Hip adduction 4- 4-  Hip internal rotation    Hip external rotation    Knee flexion 4 4  Knee extension 4 4  Ankle dorsiflexion 4- 4-   (Blank rows = not tested)  LUMBAR SPECIAL TESTS:  Straight leg raise test: Negative and FABER test: Positive; FADIR; positive on B LE   FUNCTIONAL TESTS:  5 times sit to stand: 42.11 sec 6 minute walk test: NT on eval Dynamic Gait Index: NT on eval   GAIT: Distance walked: 75' Assistive device utilized: None Level of assistance: Modified independence Comments: decreased stance time bilaterally, mild L/R lateral lean    TODAY'S TREATMENT:                                                                                                                           DATE: 08/30/23  Subjective: Pt reports no pain  in her L hip today; she reports a little soreness in her upper back/shoulders/neck secondary to stress/ tension.  Pt. Is stressed about home remediation project.     Therex:  Nustep L4 for 10 mins B UE/LE at end of session to improve cardiovascular endurance  High knees in //-bars with no AW: 3 laps, no UE support as needed  Sidesteps in //-bars with no AW: 3 laps, no UE support as needed  Backwards in //-bars with no AW: 3 laps, no UE support as needed  Walking alt. UE/LE touches in //-bars (light UE assist required for safety and cuing for proper technique).    Supine SLR 10x on L/R (c/o R hip "pinching"/ discomfort, no pain on L).    Deadbug Perturbations: 3x1 minute holds with 4# blue ball at chest  Squats/ lunges in //-bars with  cuing for upright posture.    Standing hip flexion/ abduction/ extension 20x each.    Step ups with added hip flexion 10x at stairs.    Discussed HEP     Not today: Resisted Sidesteps in // bars with green TB around knees: 4 laps each direction Standing 3-way hip with green TB around knees: 1x10 each leg TRX Squats: 2x15 with chair for cueing for squat depth Hooklying Bridge: 2x15 - mild low back pain at start of first set, improved with repetition SLR: x10 each side - much improved since initial evaluation; no pain and pt demonstrates symmetrical ROM   PATIENT EDUCATION:  Education details: HEP, POC, goals Person educated: Patient Education method: Explanation, Demonstration, and Handouts Education comprehension: verbalized understanding and returned demonstration  HOME EXERCISE PROGRAM: Access Code: [email or web address] URL: [email or web address] Date: 07/18/2023 Prepared by: Maylon Peppers  Exercises - Standing Hip Abduction with Counter Support  - 2-3 x daily - 5-7 x weekly - 3 sets - 10 reps - Standing March with Counter Support  - 2-3 x daily - 5-7 x weekly - 3 sets - 10 reps - Heel Raises with Counter Support  - 2-3 x daily - 5-7 x weekly - 3 sets - 10 reps - Sit to Stand with Counter Support  - 2-3 x daily - 5-7 x weekly - 3 sets - 10 reps  ASSESSMENT:  CLINICAL IMPRESSION: Pt presents to PT with no L hip pain. Exercises today focused on improving pt's core strength and engagement throughout various functional exercises, such as marching, walking, side-stepping, reaching. Pt reports no pain with the exercises, just mild core fatigue particularly with standing palloff press.  Pt reports improvement in L hip flexor pain since initial evaluation. Pt will benefit from skilled PT interventions to address listed impairments to improve overall mobility and reduce pain during ADLs and walking.   OBJECTIVE IMPAIRMENTS: Abnormal gait, cardiopulmonary status limiting activity,  decreased activity tolerance, decreased balance, decreased endurance, decreased mobility, difficulty walking, decreased strength, improper body mechanics, postural dysfunction, and pain.   ACTIVITY LIMITATIONS: bending, standing, squatting, sleeping, stairs, transfers, and locomotion level  PARTICIPATION LIMITATIONS: cleaning, laundry, shopping, and community activity  PERSONAL FACTORS: Age, Behavior pattern, Fitness, and Past/current experiences are also affecting patient's functional outcome.   REHAB POTENTIAL: Good  CLINICAL DECISION MAKING: Stable/uncomplicated  EVALUATION COMPLEXITY: Low   GOALS: Goals reviewed with patient? Yes  SHORT TERM GOALS: Target date: 08/15/2023  Patient will be independent in HEP to improve strength/mobility for better functional independence with ADLs. Baseline: 8/29: HEP initiated Goal status: Partially met  LONG TERM GOALS: Target date: 09/12/2023  Patient will increase FOTO score to  equal to or greater than 48 to demonstrate statistically significant improvement in mobility and quality of life.  Baseline: 8/29: 31 Goal status: INITIAL  2.  Patient (> 3 years old) will complete five times sit to stand test in < 15 seconds indicating an increased LE strength and improved balance. Baseline: 8/29: 42.11 seconds Goal status: INITIAL  3.  Patient will increase six minute walk test distance by 200' for progression to community ambulator and improve gait ability. Baseline: 8/29: to be tested on 2nd visit Goal status: INITIAL  4.  Patient will increase BLE gross strength to 4+/5 as to improve functional strength for independent gait, increased standing tolerance and increased ADL ability. Baseline: 8/29: see above  Goal status: INITIAL  5.  Patient will increase Dynamic Gait Index (DGI) score to >19/24 as to demonstrate reduced fall risk and improved dynamic gait balance for better safety with community/home ambulation.  Baseline: 8/29: to be  tested on 2nd visit  Goal status: INITIAL   PLAN:  PT FREQUENCY: 1-2x/week  PT DURATION: 8 weeks  PLANNED INTERVENTIONS: Therapeutic exercises, Therapeutic activity, Neuromuscular re-education, Balance training, Gait training, Patient/Family education, Self Care, Joint mobilization, Stair training, DME instructions, Aquatic Therapy, Dry Needling, Spinal mobilization, Cryotherapy, Moist heat, and Manual therapy.  PLAN FOR NEXT SESSION: Teach pt how to get up from falls with no chair support, progress core/hip strengthening exercises, LE/trunk stretching.  Reassess LTGs  Cammie Mcgee, PT, DPT # [id number] 08/30/23, 2:35 PM

## 2023-09-03 DIAGNOSIS — J301 Allergic rhinitis due to pollen: Secondary | ICD-10-CM | POA: Diagnosis not present

## 2023-09-04 ENCOUNTER — Ambulatory Visit: Payer: Medicare HMO | Admitting: Physical Therapy

## 2023-09-04 ENCOUNTER — Encounter: Payer: Self-pay | Admitting: Physical Therapy

## 2023-09-04 DIAGNOSIS — M6281 Muscle weakness (generalized): Secondary | ICD-10-CM

## 2023-09-04 DIAGNOSIS — M5459 Other low back pain: Secondary | ICD-10-CM

## 2023-09-04 DIAGNOSIS — R262 Difficulty in walking, not elsewhere classified: Secondary | ICD-10-CM

## 2023-09-04 NOTE — Therapy (Signed)
OUTPATIENT PHYSICAL THERAPY THORACOLUMBAR/LOWER EXTREMITY TREATMENT   Patient Name: YAZMYNE SIEDLECKI MRN: [id number] DOB:1946-04-19, 77 y.o., female Today's Date: 09/04/2023  END OF SESSION:  PT End of Session - 09/04/23 1137     Visit Number 12    Number of Visits 17    Date for PT Re-Evaluation 09/12/23    PT Start Time 1115    PT Stop Time 1158    PT Time Calculation (min) 43 min    Activity Tolerance Patient tolerated treatment well    Behavior During Therapy [facility] for tasks assessed/performed               Past Medical History:  Diagnosis Date   Allergy    hay fever   Anxiety    Arthritis    Chronic back pain    Diabetes mellitus    Dyspnea    a. 07/2017 Echo: EF 60-65%. No rwma. Mildly dil LA. Nl RV fxn.   GERD (gastroesophageal reflux disease)    History of hiatal hernia    Hyperlipidemia    Hypertension    Osteoarthritis    Pneumonia    Spinal stenosis    Type 2 diabetes mellitus (HCC)    UTI (lower urinary tract infection)    Past Surgical History:  Procedure Laterality Date   ABDOMINAL HYSTERECTOMY  1973   ABDOMINAL HYSTERECTOMY     APPENDECTOMY     APPLICATION OF ROBOTIC ASSISTANCE FOR SPINAL PROCEDURE N/A 09/15/2018   Procedure: APPLICATION OF ROBOTIC ASSISTANCE FOR SPINAL PROCEDURE;  Surgeon: Barnett Abu, MD;  Location: [facility] OR;  Service: Neurosurgery;  Laterality: N/A;   BACK SURGERY  04/17/13, 2017, 2019   CATARACT EXTRACTION W/PHACO Left 12/16/2019   Procedure: CATARACT EXTRACTION PHACO AND INTRAOCULAR LENS PLACEMENT (IOC) LEFT PANOPTIX LENS DIABETIC 8.07 01:19.2 10.2%;  Surgeon: Lockie Mola, MD;  Location: [facility];  Service: Ophthalmology;  Laterality: Left;  Diabetic - oral meds   CATARACT EXTRACTION W/PHACO Right 01/06/2020   Procedure: CATARACT EXTRACTION PHACO AND INTRAOCULAR LENS PLACEMENT (IOC) PANOPTIX LENS RIGHT DIABETIC 10.73 01:08.6 15.7%;  Surgeon: Lockie Mola, MD;  Location: [facility];  Service:  Ophthalmology;  Laterality: Right;  diabetic   CHOLECYSTECTOMY  1995   COLONOSCOPY WITH PROPOFOL N/A 03/15/2021   Procedure: COLONOSCOPY WITH PROPOFOL;  Surgeon: Earline Mayotte, MD;  Location: [facility] END[facility]COPY;  Service: Endoscopy;  Laterality: N/A;   ESOPHAGOGASTRODUODENOSCOPY N/A 06/20/2022   Procedure: ESOPHAGOGASTRODUODENOSCOPY (EGD);  Surgeon: Toledo, Boykin Nearing, MD;  Location: [facility] END[facility]COPY;  Service: Gastroenterology;  Laterality: N/A;   EYE SURGERY  2021   bilateral cataract removal with lens implants   HERNIA REPAIR  08/2006   lap band  08/2003   LUMBAR LAMINECTOMY/ DECOMPRESSION WITH MET-RX Left 12/29/2020   Procedure: Left Thoracic Seven-Eight Microdiscectomy;  Surgeon: Barnett Abu, MD;  Location: [facility] OR;  Service: Neurosurgery;  Laterality: Left;   SEPTOPLASTY  04/1978   TONSILLECTOMY AND ADENOIDECTOMY  1979   TOTAL HIP ARTHROPLASTY  02/2012   Right, Dr. Ernest Pine   TOTAL HIP ARTHROPLASTY Left 08/31/2019   Procedure: TOTAL HIP ARTHROPLASTY;  Surgeon: Donato Heinz, MD;  Location: [facility] ORS;  Service: Orthopedics;  Laterality: Left;   UPPER GI ENDOSCOPY  06/20/2022   Patient Active Problem List   Diagnosis Date Noted   Trigger finger 07/03/2023   Leg pain 11/29/2022   Right leg numbness 08/20/2022   Memory change 08/17/2022   Bilateral carotid artery stenosis 01/31/2022   Balance problem 01/31/2022   History of  COVID-19 11/25/2021   Fall 05/22/2021   Herniated nucleus pulposus with myelopathy, thoracic 12/29/2020   Low back pain 09/15/2020   Opioid use (10 MME/day) 05/16/2020   Hypokalemia 04/28/2020   Chronic pain syndrome 04/20/2020   Disorder of skeletal system 04/20/2020   Chronic thoracic spine pain (Bilateral) (R>L) 04/20/2020   Myofascial pain syndrome of thoracic spine 04/20/2020   Chronic musculoskeletal pain 04/20/2020   Spasm of thoracic back muscle 04/20/2020   Weight loss 12/31/2019   History of total hip replacement (Left) 08/31/2019   Early satiety  07/05/2019   Lumbar stenosis with neurogenic claudication 09/15/2018   Shortness of breath 06/19/2018   Cough 04/13/2018   Aortic atherosclerosis (HCC) 01/11/2018   GERD (gastroesophageal reflux disease) 12/23/2016   Atrophic vaginitis 03/18/2016   Spondylolisthesis at L5-S1 level 12/06/2015   Rhinitis, allergic 11/22/2015   Right leg pain 07/27/2014   Neuritis or radiculitis due to rupture of lumbar intervertebral disc 06/28/2014   Chronic shoulder pain (Bilateral) 02/24/2014   History of bariatric surgery 01/18/2014   Status post bariatric surgery 01/18/2014   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   Depression, major, single episode, mild (HCC) 05/14/2013   Major depressive disorder, single episode 05/14/2013   Healthcare maintenance 01/07/2013   Hypercholesterolemia 08/22/2012   Hyperlipidemia 08/22/2012   Hypertension 07/07/2012   Diabetes mellitus type 2, diet-controlled (HCC) 07/07/2012   Insomnia 07/07/2012   Obesity (BMI 30-39.9) 07/07/2012   Essential (primary) hypertension 07/07/2012   Chronic low back pain (1ry area of Pain) (Bilateral) (R>L) w/o sciatica 10/18/2011    PCP: Dale [place], MD  REFERRING PROVIDER: Dale [place], MD  REFERRING DIAG: M79.604,M79.605 (ICD-10-CM) - Pain in both lower extremities  Rationale for Evaluation and Treatment: Rehabilitation  THERAPY DIAG:  Difficulty in walking, not elsewhere classified  Muscle weakness (generalized)  Other low back pain  ONSET DATE: after 2013 (after R hip replacement)  SUBJECTIVE:                                                                                                                                                                                           SUBJECTIVE STATEMENT: Patient reports BLE weakness and numbness. Numbness is constant on anterolateral thighs but gets worse with standing >10 minutes. Patient unable to ambulate her block around her home. Difficulty with stairs  (step to pattern). Patient has had 4 back surgeries - L1-S1 fusion and "rods" T8/9-L1 starting in 2014. Has had L and R hip replacements.   PERTINENT HISTORY:  Patient has been seeing Dr. Danielle Dess for her back with radicular pain. Has had a fall 4-5 months ago and hurt her  L knee. Per Dr. Roby Lofts note from 07/05/23, patient reports weakness in upper thighs and limited in walking due to her legs. Dr. Lorin Picket discussed with patient about utilizing a walker to help with patient reporting she does better in stores with a cart she can lean on.   PAIN:  Are you having pain? Yes: NPRS scale: 4-5/10 at worst  Pain location: B legs  Pain description: restless Aggravating factors: laying down, standing for prolonged periods of time  Relieving factors: movement, medication   PRECAUTIONS: Fall  WEIGHT BEARING RESTRICTIONS: No  FALLS:  Has patient fallen in last 6 months? Yes. Number of falls 1  LIVING ENVIRONMENT:   Lives with: lives with their spouse Lives in: House/apartment Stairs: No Has following equipment at home: Single point cane and Environmental consultant - 4 wheeled  OCCUPATION: retired   PLOF: Independent  PATIENT GOALS: to be able to walk a distance and not hurt   NEXT MD VISIT: 09/2023 (PCP)  OBJECTIVE:   DIAGNOSTIC FINDINGS:  N/A - unable to obtain due to outside provider   PATIENT SURVEYS:  FOTO 31 with goal of 48  SCREENING FOR RED FLAGS: Bowel or bladder incontinence: Yes: at times, patient reports unaware of bowel movement until trip to the restroom.  No issues with bladder incontinence.  Spinal tumors: No Cauda equina syndrome: No Compression fracture: No Abdominal aneurysm: No  COGNITION: Overall cognitive status: Within functional limits for tasks assessed     SENSATION: Sensory testing WFL, however patient reports numbness on anterolateral parts of B thighs  POSTURE: rounded shoulders, forward head, and increased thoracic kyphosis  PALPATION: Nontender to lumbar and  thoracic paraspinals  Notable piriformis tightness bilaterally with FADIR  LUMBAR ROM:   AROM eval  Flexion 80%  Extension 50%  Right lateral flexion To mid thigh*  Left lateral flexion To mid thigh*  Right rotation 50% *  Left rotation 50% *   (Blank rows = not tested)   *=pain   LOWER EXTREMITY MMT:    MMT Right eval Left eval  Hip flexion 4- 4-  Hip extension    Hip abduction 4- 4-  Hip adduction 4- 4-  Hip internal rotation    Hip external rotation    Knee flexion 4 4  Knee extension 4 4  Ankle dorsiflexion 4- 4-   (Blank rows = not tested)  LUMBAR SPECIAL TESTS:  Straight leg raise test: Negative and FABER test: Positive; FADIR; positive on B LE   FUNCTIONAL TESTS:  5 times sit to stand: 42.11 sec 6 minute walk test: NT on eval Dynamic Gait Index: NT on eval   GAIT: Distance walked: 50' Assistive device utilized: None Level of assistance: Modified independence Comments: decreased stance time bilaterally, mild L/R lateral lean    TODAY'S TREATMENT:  DATE: 09/04/23  Subjective: Pt reports no pain in her L hip today; she reports a little stiffness in her back from sleeping on it wrong. Pt reports improvement in bilateral leg numbness today as well. Pt reports she has a visit to see her doctor on October 29th to discuss her nerve symptoms.    Therex: : 899 feet no AD 95% O2, 86 npm post 6 minutes, HR down to 73 bpm after 2 minutes rest  Deadbugs with 6# blue ball at chest  [place]: 2x15 - mild low back pain at start of first set, improved with repetition  Sit-to-stands with dumbbell press-outs: 2x10 with 5# DB  Alternating step taps onto 6-inch step: 2x1 minute bouts  Standing 2-way hip (extension and abduction) with BUE support: 2x8 each leg   Not today: Resisted Sidesteps in // bars with green TB around knees: 4 laps  each direction Standing 3-way hip with green TB around knees: 1x10 each leg TRX Squats: 2x15 with chair for cueing for squat depth SLR: x10 each side - much improved since initial evaluation; no pain and pt demonstrates symmetrical ROM   PATIENT EDUCATION:  Education details: HEP, POC, goals Person educated: Patient Education method: Explanation, Demonstration, and Handouts Education comprehension: verbalized understanding and returned demonstration  HOME EXERCISE PROGRAM: Access Code: [email or web address] URL: [email or web address] Date: 07/18/2023 Prepared by: Maylon Peppers  Exercises - Standing Hip Abduction with Counter Support  - 2-3 x daily - 5-7 x weekly - 3 sets - 10 reps - Standing March with Counter Support  - 2-3 x daily - 5-7 x weekly - 3 sets - 10 reps - Heel Raises with Counter Support  - 2-3 x daily - 5-7 x weekly - 3 sets - 10 reps - Sit to Stand with Counter Support  - 2-3 x daily - 5-7 x weekly - 3 sets - 10 reps  ASSESSMENT:  CLINICAL IMPRESSION: Pt presents to PT with no L hip pain, just her usual low back stiffness. Today's session consisted of exercises to improve her hip and core strength/stability. Pt tolerated all exercises well with no increase in back or hip pain. She requires occasional cueing during deadbugs and sit-to-stands for proper form and for preventing trunk compensation with standing. Pt required the plinth height to be raised slightly on the second set of sit-to-stands secondary to LE weakness. performed at start of session today instead of nustep to reassess pt's cardiovascular and LE endurance as well as to provide a warm-up. Pt demonstrated improvement in distance compared to last time however was still decreased compared to initial evaluation. Pt will benefit from skilled PT interventions to address listed impairments to improve overall mobility and reduce pain during ADLs and walking.   OBJECTIVE IMPAIRMENTS: Abnormal gait,  cardiopulmonary status limiting activity, decreased activity tolerance, decreased balance, decreased endurance, decreased mobility, difficulty walking, decreased strength, improper body mechanics, postural dysfunction, and pain.   ACTIVITY LIMITATIONS: bending, standing, squatting, sleeping, stairs, transfers, and locomotion level  PARTICIPATION LIMITATIONS: cleaning, laundry, shopping, and community activity  PERSONAL FACTORS: Age, Behavior pattern, Fitness, and Past/current experiences are also affecting patient's functional outcome.   REHAB POTENTIAL: Good  CLINICAL DECISION MAKING: Stable/uncomplicated  EVALUATION COMPLEXITY: Low   GOALS: Goals reviewed with patient? Yes  SHORT TERM GOALS: Target date: 08/15/2023   Patient will be independent in HEP to improve strength/mobility for better functional independence with ADLs. Baseline: 8/29: HEP initiated Goal status: Partially met   LONG TERM GOALS: Target date: 09/12/2023  Patient will increase FOTO score to equal to or greater than 48 to demonstrate statistically significant improvement in mobility and quality of life.  Baseline: 8/29: 31, 10/9: 33 Goal status: Not met, progressing   2.  Patient (> 55 years old) will complete five times sit to stand test in < 15 seconds indicating an increased LE strength and improved balance. Baseline: 8/29: 42.11 seconds (with use of hands), 10/9: 34.5 seconds (with use of hands) Goal status: Not met, progressing   3.  Patient will increase six minute walk test distance by 200' for progression to community ambulator and improve gait ability. Baseline: 990 feet, 10/9: 639 feet (1 min seated rest break), 10/16: 900 feet Goal status: Not met   4.  Patient will increase BLE gross strength to 4+/5 as to improve functional strength for independent gait, increased standing tolerance and increased ADL ability. Baseline: 8/29: see above, 10/9: see above for updated MMT scores Goal status: Not  met, progressing   5.  Patient will increase Dynamic Gait Index (DGI) score to >19/24 as to demonstrate reduced fall risk and improved dynamic gait balance for better safety with community/home ambulation.  Baseline: 15/24, 10/9: 20/24 Goal status: GOAL MET   PLAN:  PT FREQUENCY: 1-2x/week  PT DURATION: 8 weeks  PLANNED INTERVENTIONS: Therapeutic exercises, Therapeutic activity, Neuromuscular re-education, Balance training, Gait training, Patient/Family education, Self Care, Joint mobilization, Stair training, DME instructions, Aquatic Therapy, Dry Needling, Spinal mobilization, Cryotherapy, Moist heat, and Manual therapy.  PLAN FOR NEXT SESSION: Teach pt how to get up from falls with no chair support, progress core/hip strengthening exercises, LE/trunk stretching, emphasize endurance for exercises (increase time under tension/rep count with lighter weight)  Cammie Mcgee, PT, DPT # [phone] Cena Benton, SPT 09/04/23, 12:12 PM

## 2023-09-09 ENCOUNTER — Ambulatory Visit: Payer: Medicare HMO | Admitting: Physical Therapy

## 2023-09-09 ENCOUNTER — Encounter: Payer: Self-pay | Admitting: Physical Therapy

## 2023-09-09 DIAGNOSIS — L84 Corns and callosities: Secondary | ICD-10-CM | POA: Diagnosis not present

## 2023-09-09 DIAGNOSIS — M5459 Other low back pain: Secondary | ICD-10-CM | POA: Diagnosis not present

## 2023-09-09 DIAGNOSIS — R262 Difficulty in walking, not elsewhere classified: Secondary | ICD-10-CM

## 2023-09-09 DIAGNOSIS — M19071 Primary osteoarthritis, right ankle and foot: Secondary | ICD-10-CM | POA: Diagnosis not present

## 2023-09-09 DIAGNOSIS — E119 Type 2 diabetes mellitus without complications: Secondary | ICD-10-CM | POA: Diagnosis not present

## 2023-09-09 DIAGNOSIS — L851 Acquired keratosis [keratoderma] palmaris et plantaris: Secondary | ICD-10-CM | POA: Diagnosis not present

## 2023-09-09 DIAGNOSIS — M6281 Muscle weakness (generalized): Secondary | ICD-10-CM | POA: Diagnosis not present

## 2023-09-09 DIAGNOSIS — M2011 Hallux valgus (acquired), right foot: Secondary | ICD-10-CM | POA: Diagnosis not present

## 2023-09-09 DIAGNOSIS — M79671 Pain in right foot: Secondary | ICD-10-CM | POA: Diagnosis not present

## 2023-09-09 NOTE — Therapy (Signed)
OUTPATIENT PHYSICAL THERAPY THORACOLUMBAR/LOWER EXTREMITY TREATMENT   Patient Name: Erin Camacho MRN: [id number] DOB:08-Feb-1946, 77 y.o., female Today's Date: 09/09/2023  END OF SESSION:  PT End of Session - 09/09/23 1033     Visit Number 13    Number of Visits 17    Date for PT Re-Evaluation 09/12/23    PT Start Time 1033    PT Stop Time 1117    PT Time Calculation (min) 44 min    Activity Tolerance Patient tolerated treatment well    Behavior During Therapy [facility] for tasks assessed/performed             Past Medical History:  Diagnosis Date   Allergy    hay fever   Anxiety    Arthritis    Chronic back pain    Diabetes mellitus    Dyspnea    a. 07/2017 Echo: EF 60-65%. No rwma. Mildly dil LA. Nl RV fxn.   GERD (gastroesophageal reflux disease)    History of hiatal hernia    Hyperlipidemia    Hypertension    Osteoarthritis    Pneumonia    Spinal stenosis    Type 2 diabetes mellitus (HCC)    UTI (lower urinary tract infection)    Past Surgical History:  Procedure Laterality Date   ABDOMINAL HYSTERECTOMY  1973   ABDOMINAL HYSTERECTOMY     APPENDECTOMY     APPLICATION OF ROBOTIC ASSISTANCE FOR SPINAL PROCEDURE N/A 09/15/2018   Procedure: APPLICATION OF ROBOTIC ASSISTANCE FOR SPINAL PROCEDURE;  Surgeon: Barnett Abu, MD;  Location: [facility] OR;  Service: Neurosurgery;  Laterality: N/A;   BACK SURGERY  04/17/13, 2017, 2019   CATARACT EXTRACTION W/PHACO Left 12/16/2019   Procedure: CATARACT EXTRACTION PHACO AND INTRAOCULAR LENS PLACEMENT (IOC) LEFT PANOPTIX LENS DIABETIC 8.07 01:19.2 10.2%;  Surgeon: Lockie Mola, MD;  Location: [facility];  Service: Ophthalmology;  Laterality: Left;  Diabetic - oral meds   CATARACT EXTRACTION W/PHACO Right 01/06/2020   Procedure: CATARACT EXTRACTION PHACO AND INTRAOCULAR LENS PLACEMENT (IOC) PANOPTIX LENS RIGHT DIABETIC 10.73 01:08.6 15.7%;  Surgeon: Lockie Mola, MD;  Location: [facility];  Service:  Ophthalmology;  Laterality: Right;  diabetic   CHOLECYSTECTOMY  1995   COLONOSCOPY WITH PROPOFOL N/A 03/15/2021   Procedure: COLONOSCOPY WITH PROPOFOL;  Surgeon: Earline Mayotte, MD;  Location: [facility];  Service: Endoscopy;  Laterality: N/A;   ESOPHAGOGASTRODUODENOSCOPY N/A 06/20/2022   Procedure: ESOPHAGOGASTRODUODENOSCOPY (EGD);  Surgeon: Toledo, Boykin Nearing, MD;  Location: [facility];  Service: Gastroenterology;  Laterality: N/A;   EYE SURGERY  2021   bilateral cataract removal with lens implants   HERNIA REPAIR  08/2006   lap band  08/2003   LUMBAR LAMINECTOMY/ DECOMPRESSION WITH MET-RX Left 12/29/2020   Procedure: Left Thoracic Seven-Eight Microdiscectomy;  Surgeon: Barnett Abu, MD;  Location: [facility];  Service: Neurosurgery;  Laterality: Left;   SEPTOPLASTY  04/1978   TONSILLECTOMY AND ADENOIDECTOMY  1979   TOTAL HIP ARTHROPLASTY  02/2012   Right, Dr. Ernest Pine   TOTAL HIP ARTHROPLASTY Left 08/31/2019   Procedure: TOTAL HIP ARTHROPLASTY;  Surgeon: Donato Heinz, MD;  Location: [facility] ORS;  Service: Orthopedics;  Laterality: Left;   UPPER GI ENDOSCOPY  06/20/2022   Patient Active Problem List   Diagnosis Date Noted   Trigger finger 07/03/2023   Leg pain 11/29/2022   Right leg numbness 08/20/2022   Memory change 08/17/2022   Bilateral carotid artery stenosis 01/31/2022   Balance problem 01/31/2022   History of COVID-19 11/25/2021  Fall 05/22/2021   Herniated nucleus pulposus with myelopathy, thoracic 12/29/2020   Low back pain 09/15/2020   Opioid use (10 MME/day) 05/16/2020   Hypokalemia 04/28/2020   Chronic pain syndrome 04/20/2020   Disorder of skeletal system 04/20/2020   Chronic thoracic spine pain (Bilateral) (R>L) 04/20/2020   Myofascial pain syndrome of thoracic spine 04/20/2020   Chronic musculoskeletal pain 04/20/2020   Spasm of thoracic back muscle 04/20/2020   Weight loss 12/31/2019   History of total hip replacement (Left) 08/31/2019   Early satiety  07/05/2019   Lumbar stenosis with neurogenic claudication 09/15/2018   Shortness of breath 06/19/2018   Cough 04/13/2018   Aortic atherosclerosis (HCC) 01/11/2018   GERD (gastroesophageal reflux disease) 12/23/2016   Atrophic vaginitis 03/18/2016   Spondylolisthesis at L5-S1 level 12/06/2015   Rhinitis, allergic 11/22/2015   Right leg pain 07/27/2014   Neuritis or radiculitis due to rupture of lumbar intervertebral disc 06/28/2014   Chronic shoulder pain (Bilateral) 02/24/2014   History of bariatric surgery 01/18/2014   Status post bariatric surgery 01/18/2014   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   Depression, major, single episode, mild (HCC) 05/14/2013   Major depressive disorder, single episode 05/14/2013   Healthcare maintenance 01/07/2013   Hypercholesterolemia 08/22/2012   Hyperlipidemia 08/22/2012   Hypertension 07/07/2012   Diabetes mellitus type 2, diet-controlled (HCC) 07/07/2012   Insomnia 07/07/2012   Obesity (BMI 30-39.9) 07/07/2012   Essential (primary) hypertension 07/07/2012   Chronic low back pain (1ry area of Pain) (Bilateral) (R>L) w/o sciatica 10/18/2011    PCP: Dale [place], MD  REFERRING PROVIDER: Dale Lydia, MD  REFERRING DIAG: M79.604,M79.605 (ICD-10-CM) - Pain in both lower extremities  Rationale for Evaluation and Treatment: Rehabilitation  THERAPY DIAG:  Difficulty in walking, not elsewhere classified  Muscle weakness (generalized)  Other low back pain  ONSET DATE: after 2013 (after R hip replacement)  SUBJECTIVE:                                                                                                                                                                                           SUBJECTIVE STATEMENT: Patient reports BLE weakness and numbness. Numbness is constant on anterolateral thighs but gets worse with standing >10 minutes. Patient unable to ambulate her block around her home. Difficulty with stairs  (step to pattern). Patient has had 4 back surgeries - L1-S1 fusion and "rods" T8/9-L1 starting in 2014. Has had L and R hip replacements.   PERTINENT HISTORY:  Patient has been seeing Dr. Danielle Dess for her back with radicular pain. Has had a fall 4-5 months ago and hurt her L knee. Per Dr.  Scott's note from 07/05/23, patient reports weakness in upper thighs and limited in walking due to her legs. Dr. Lorin Picket discussed with patient about utilizing a walker to help with patient reporting she does better in stores with a cart she can lean on.   PAIN:  Are you having pain? Yes: NPRS scale: 4-5/10 at worst  Pain location: B legs  Pain description: restless Aggravating factors: laying down, standing for prolonged periods of time  Relieving factors: movement, medication   PRECAUTIONS: Fall  WEIGHT BEARING RESTRICTIONS: No  FALLS:  Has patient fallen in last 6 months? Yes. Number of falls 1  LIVING ENVIRONMENT:   Lives with: lives with their spouse Lives in: House/apartment Stairs: No Has following equipment at home: Single point cane and Environmental consultant - 4 wheeled  OCCUPATION: retired   PLOF: Independent  PATIENT GOALS: to be able to walk a distance and not hurt   NEXT MD VISIT: 09/2023 (PCP)  OBJECTIVE:   DIAGNOSTIC FINDINGS:  N/A - unable to obtain due to outside provider   PATIENT SURVEYS:  FOTO 31 with goal of 48  SCREENING FOR RED FLAGS: Bowel or bladder incontinence: Yes: at times, patient reports unaware of bowel movement until trip to the restroom.  No issues with bladder incontinence.  Spinal tumors: No Cauda equina syndrome: No Compression fracture: No Abdominal aneurysm: No  COGNITION: Overall cognitive status: Within functional limits for tasks assessed     SENSATION: Sensory testing WFL, however patient reports numbness on anterolateral parts of B thighs  POSTURE: rounded shoulders, forward head, and increased thoracic kyphosis  PALPATION: Nontender to lumbar and  thoracic paraspinals  Notable piriformis tightness bilaterally with FADIR  LUMBAR ROM:   AROM eval  Flexion 80%  Extension 50%  Right lateral flexion To mid thigh*  Left lateral flexion To mid thigh*  Right rotation 50% *  Left rotation 50% *   (Blank rows = not tested)   *=pain   LOWER EXTREMITY MMT:    MMT Right eval Left eval  Hip flexion 4- 4-  Hip extension    Hip abduction 4- 4-  Hip adduction 4- 4-  Hip internal rotation    Hip external rotation    Knee flexion 4 4  Knee extension 4 4  Ankle dorsiflexion 4- 4-   (Blank rows = not tested)  LUMBAR SPECIAL TESTS:  Straight leg raise test: Negative and FABER test: Positive; FADIR; positive on B LE   FUNCTIONAL TESTS:  5 times sit to stand: 42.11 sec 6 minute walk test: NT on eval Dynamic Gait Index: NT on eval   GAIT: Distance walked: 44' Assistive device utilized: None Level of assistance: Modified independence Comments: decreased stance time bilaterally, mild L/R lateral lean    TODAY'S TREATMENT:                                                                                                                           DATE: 09/09/23  Subjective: Pt reports no pain in her L hip today; Pt. remains stressed about home remediation project.  Pt. States she has been walking over the weekend.     Therex:  Nustep L4 for 10 mins B UE/LE at end of session to improve cardiovascular endurance  3# ankle wts.:  seated LAQ/ seated marching/ 6" step touches/ hip abduction/ heel raises 20x each.  Walking around PT clinic and hallway with ankle wts. With focus on heel strike.    Supine deadbug 10x/ bridging 10x.     Supine L/R hip and LE generalized stretches (9 min.).    Discussed HEP   Not today: Resisted Sidesteps in // bars with green TB around knees: 4 laps each direction Standing 3-way hip with green TB around knees: 1x10 each leg TRX Squats: 2x15 with chair for cueing for squat depth Hooklying Bridge:  2x15 - mild low back pain at start of first set, improved with repetition SLR: x10 each side - much improved since initial evaluation; no pain and pt demonstrates symmetrical ROM   PATIENT EDUCATION:  Education details: HEP, POC, goals Person educated: Patient Education method: Explanation, Demonstration, and Handouts Education comprehension: verbalized understanding and returned demonstration  HOME EXERCISE PROGRAM: Access Code: [email or web address] URL: [email or web address] Date: 07/18/2023 Prepared by: Maylon Peppers  Exercises - Standing Hip Abduction with Counter Support  - 2-3 x daily - 5-7 x weekly - 3 sets - 10 reps - Standing March with Counter Support  - 2-3 x daily - 5-7 x weekly - 3 sets - 10 reps - Heel Raises with Counter Support  - 2-3 x daily - 5-7 x weekly - 3 sets - 10 reps - Sit to Stand with Counter Support  - 2-3 x daily - 5-7 x weekly - 3 sets - 10 reps  ASSESSMENT:  CLINICAL IMPRESSION: Pt presents to PT with no L hip pain. Exercises today focused on improving pt's core strength and engagement throughout various functional exercises, such as marching, walking. Pt reports no pain with the exercises, just mild core fatigue particularly with bridging.  Pt reports improvement in L hip flexor pain since initial evaluation. Pt will benefit from skilled PT interventions to address listed impairments to improve overall mobility and reduce pain during ADLs and walking.   OBJECTIVE IMPAIRMENTS: Abnormal gait, cardiopulmonary status limiting activity, decreased activity tolerance, decreased balance, decreased endurance, decreased mobility, difficulty walking, decreased strength, improper body mechanics, postural dysfunction, and pain.   ACTIVITY LIMITATIONS: bending, standing, squatting, sleeping, stairs, transfers, and locomotion level  PARTICIPATION LIMITATIONS: cleaning, laundry, shopping, and community activity  PERSONAL FACTORS: Age, Behavior pattern, Fitness, and  Past/current experiences are also affecting patient's functional outcome.   REHAB POTENTIAL: Good  CLINICAL DECISION MAKING: Stable/uncomplicated  EVALUATION COMPLEXITY: Low   GOALS: Goals reviewed with patient? Yes  SHORT TERM GOALS: Target date: 08/15/2023  Patient will be independent in HEP to improve strength/mobility for better functional independence with ADLs. Baseline: 8/29: HEP initiated Goal status: Partially met  LONG TERM GOALS: Target date: 09/12/2023  Patient will increase FOTO score to equal to or greater than 48 to demonstrate statistically significant improvement in mobility and quality of life.  Baseline: 8/29: 31 Goal status: INITIAL  2.  Patient (> 44 years old) will complete five times sit to stand test in < 15 seconds indicating an increased LE strength and improved balance. Baseline: 8/29: 42.11 seconds Goal status: INITIAL  3.  Patient will increase six minute walk test distance by 200' for progression to  community ambulator and improve gait ability. Baseline: 8/29: to be tested on 2nd visit Goal status: INITIAL  4.  Patient will increase BLE gross strength to 4+/5 as to improve functional strength for independent gait, increased standing tolerance and increased ADL ability. Baseline: 8/29: see above  Goal status: INITIAL  5.  Patient will increase Dynamic Gait Index (DGI) score to >19/24 as to demonstrate reduced fall risk and improved dynamic gait balance for better safety with community/home ambulation.  Baseline: 8/29: to be tested on 2nd visit  Goal status: INITIAL   PLAN:  PT FREQUENCY: 1-2x/week  PT DURATION: 8 weeks  PLANNED INTERVENTIONS: Therapeutic exercises, Therapeutic activity, Neuromuscular re-education, Balance training, Gait training, Patient/Family education, Self Care, Joint mobilization, Stair training, DME instructions, Aquatic Therapy, Dry Needling, Spinal mobilization, Cryotherapy, Moist heat, and Manual therapy.  PLAN FOR  NEXT SESSION: Teach pt how to get up from falls with no chair support, progress core/hip strengthening exercises, LE/trunk stretching.  CHECK GOALS  Cammie Mcgee, PT, DPT # [phone] 09/09/23, 12:35 PM

## 2023-09-10 DIAGNOSIS — J301 Allergic rhinitis due to pollen: Secondary | ICD-10-CM | POA: Diagnosis not present

## 2023-09-11 ENCOUNTER — Encounter: Payer: Self-pay | Admitting: Physical Therapy

## 2023-09-11 ENCOUNTER — Ambulatory Visit: Payer: Medicare HMO | Admitting: Physical Therapy

## 2023-09-11 DIAGNOSIS — R262 Difficulty in walking, not elsewhere classified: Secondary | ICD-10-CM

## 2023-09-11 DIAGNOSIS — M5459 Other low back pain: Secondary | ICD-10-CM | POA: Diagnosis not present

## 2023-09-11 DIAGNOSIS — M6281 Muscle weakness (generalized): Secondary | ICD-10-CM | POA: Diagnosis not present

## 2023-09-11 NOTE — Therapy (Signed)
OUTPATIENT PHYSICAL THERAPY THORACOLUMBAR/LOWER EXTREMITY TREATMENT   Patient Name: Erin Camacho MRN: [id number] DOB:[birth year], 77 y.o., female Today's Date: 09/12/2023  END OF SESSION:  PT End of Session - 09/11/23 1012     Visit Number 14    Number of Visits 17    Date for PT Re-Evaluation 09/12/23    PT Start Time 1022    PT Stop Time 1115    PT Time Calculation (min) 53 min    Activity Tolerance Patient tolerated treatment well    Behavior During Therapy [facility] for tasks assessed/performed             Past Medical History:  Diagnosis Date   Allergy    hay fever   Anxiety    Arthritis    Chronic back pain    Diabetes mellitus    Dyspnea    a. 07/2017 Echo: EF 60-65%. No rwma. Mildly dil LA. Nl RV fxn.   GERD (gastroesophageal reflux disease)    History of hiatal hernia    Hyperlipidemia    Hypertension    Osteoarthritis    Pneumonia    Spinal stenosis    Type 2 diabetes mellitus (HCC)    UTI (lower urinary tract infection)    Past Surgical History:  Procedure Laterality Date   ABDOMINAL HYSTERECTOMY  1973   ABDOMINAL HYSTERECTOMY     APPENDECTOMY     APPLICATION OF ROBOTIC ASSISTANCE FOR SPINAL PROCEDURE N/A 09/15/2018   Procedure: APPLICATION OF ROBOTIC ASSISTANCE FOR SPINAL PROCEDURE;  Surgeon: Barnett Abu, MD;  Location: [facility] OR;  Service: Neurosurgery;  Laterality: N/A;   BACK SURGERY  04/17/13, 2017, 2019   CATARACT EXTRACTION W/PHACO Left 12/16/2019   Procedure: CATARACT EXTRACTION PHACO AND INTRAOCULAR LENS PLACEMENT (IOC) LEFT PANOPTIX LENS DIABETIC 8.07 01:19.2 10.2%;  Surgeon: Lockie Mola, MD;  Location: [facility];  Service: Ophthalmology;  Laterality: Left;  Diabetic - oral meds   CATARACT EXTRACTION W/PHACO Right 01/06/2020   Procedure: CATARACT EXTRACTION PHACO AND INTRAOCULAR LENS PLACEMENT (IOC) PANOPTIX LENS RIGHT DIABETIC 10.73 01:08.6 15.7%;  Surgeon: Lockie Mola, MD;  Location: [facility];  Service:  Ophthalmology;  Laterality: Right;  diabetic   CHOLECYSTECTOMY  1995   COLONOSCOPY WITH PROPOFOL N/A 03/15/2021   Procedure: COLONOSCOPY WITH PROPOFOL;  Surgeon: Earline Mayotte, MD;  Location: [facility] END[facility];  Service: Endoscopy;  Laterality: N/A;   ESOPHAGOGASTRODUODENOSCOPY N/A 06/20/2022   Procedure: ESOPHAGOGASTRODUODENOSCOPY (EGD);  Surgeon: Toledo, Boykin Nearing, MD;  Location: [facility] END[facility]COPY;  Service: Gastroenterology;  Laterality: N/A;   EYE SURGERY  2021   bilateral cataract removal with lens implants   HERNIA REPAIR  08/2006   lap band  08/2003   LUMBAR LAMINECTOMY/ DECOMPRESSION WITH MET-RX Left 12/29/2020   Procedure: Left Thoracic Seven-Eight Microdiscectomy;  Surgeon: Barnett Abu, MD;  Location: [facility] OR;  Service: Neurosurgery;  Laterality: Left;   SEPTOPLASTY  04/1978   TONSILLECTOMY AND ADENOIDECTOMY  1979   TOTAL HIP ARTHROPLASTY  02/2012   Right, Dr. Ernest Pine   TOTAL HIP ARTHROPLASTY Left 08/31/2019   Procedure: TOTAL HIP ARTHROPLASTY;  Surgeon: Donato Heinz, MD;  Location: [facility] ORS;  Service: Orthopedics;  Laterality: Left;   UPPER GI ENDOSCOPY  06/20/2022   Patient Active Problem List   Diagnosis Date Noted   Trigger finger 07/03/2023   Leg pain 11/29/2022   Right leg numbness 08/20/2022   Memory change 08/17/2022   Bilateral carotid artery stenosis 01/31/2022   Balance problem 01/31/2022   History of COVID-19 11/25/2021  Fall 05/22/2021   Herniated nucleus pulposus with myelopathy, thoracic 12/29/2020   Low back pain 09/15/2020   Opioid use (10 MME/day) 05/16/2020   Hypokalemia 04/28/2020   Chronic pain syndrome 04/20/2020   Disorder of skeletal system 04/20/2020   Chronic thoracic spine pain (Bilateral) (R>L) 04/20/2020   Myofascial pain syndrome of thoracic spine 04/20/2020   Chronic musculoskeletal pain 04/20/2020   Spasm of thoracic back muscle 04/20/2020   Weight loss 12/31/2019   History of total hip replacement (Left) 08/31/2019   Early satiety  07/05/2019   Lumbar stenosis with neurogenic claudication 09/15/2018   Shortness of breath 06/19/2018   Cough 04/13/2018   Aortic atherosclerosis (HCC) 01/11/2018   GERD (gastroesophageal reflux disease) 12/23/2016   Atrophic vaginitis 03/18/2016   Spondylolisthesis at L5-S1 level 12/06/2015   Rhinitis, allergic 11/22/2015   Right leg pain 07/27/2014   Neuritis or radiculitis due to rupture of lumbar intervertebral disc 06/28/2014   Chronic shoulder pain (Bilateral) 02/24/2014   History of bariatric surgery 01/18/2014   Status post bariatric surgery 01/18/2014   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   Depression, major, single episode, mild (HCC) 05/14/2013   Major depressive disorder, single episode 05/14/2013   Healthcare maintenance 01/07/2013   Hypercholesterolemia 08/22/2012   Hyperlipidemia 08/22/2012   Hypertension 07/07/2012   Diabetes mellitus type 2, diet-controlled (HCC) 07/07/2012   Insomnia 07/07/2012   Obesity (BMI 30-39.9) 07/07/2012   Essential (primary) hypertension 07/07/2012   Chronic low back pain (1ry area of Pain) (Bilateral) (R>L) w/o sciatica 10/18/2011    PCP: Dale [place], MD  REFERRING PROVIDER: Dale [place], MD  REFERRING DIAG: M79.604,M79.605 (ICD-10-CM) - Pain in both lower extremities  Rationale for Evaluation and Treatment: Rehabilitation  THERAPY DIAG:  Difficulty in walking, not elsewhere classified  Muscle weakness (generalized)  Other low back pain  ONSET DATE: after 2013 (after R hip replacement)  SUBJECTIVE:                                                                                                                                                                                           SUBJECTIVE STATEMENT: Patient reports BLE weakness and numbness. Numbness is constant on anterolateral thighs but gets worse with standing >10 minutes. Patient unable to ambulate her block around her home. Difficulty with stairs  (step to pattern). Patient has had 4 back surgeries - L1-S1 fusion and "rods" T8/9-L1 starting in 2014. Has had L and R hip replacements.   PERTINENT HISTORY:  Patient has been seeing Dr. Danielle Dess for her back with radicular pain. Has had a fall 4-5 months ago and hurt her L knee. Per Dr.  Scott's note from 07/05/23, patient reports weakness in upper thighs and limited in walking due to her legs. Dr. Lorin Picket discussed with patient about utilizing a walker to help with patient reporting she does better in stores with a cart she can lean on.   PAIN:  Are you having pain? Yes: NPRS scale: 4-5/10 at worst  Pain location: B legs  Pain description: restless Aggravating factors: laying down, standing for prolonged periods of time  Relieving factors: movement, medication   PRECAUTIONS: Fall  WEIGHT BEARING RESTRICTIONS: No  FALLS:  Has patient fallen in last 6 months? Yes. Number of falls 1  LIVING ENVIRONMENT:   Lives with: lives with their spouse Lives in: House/apartment Stairs: No Has following equipment at home: Single point cane and Environmental consultant - 4 wheeled  OCCUPATION: retired   PLOF: Independent  PATIENT GOALS: to be able to walk a distance and not hurt   NEXT MD VISIT: 09/2023 (PCP)  OBJECTIVE:   DIAGNOSTIC FINDINGS:  N/A - unable to obtain due to outside provider   PATIENT SURVEYS:  FOTO 31 with goal of 48  SCREENING FOR RED FLAGS: Bowel or bladder incontinence: Yes: at times, patient reports unaware of bowel movement until trip to the restroom.  No issues with bladder incontinence.  Spinal tumors: No Cauda equina syndrome: No Compression fracture: No Abdominal aneurysm: No  COGNITION: Overall cognitive status: Within functional limits for tasks assessed     SENSATION: Sensory testing WFL, however patient reports numbness on anterolateral parts of B thighs  POSTURE: rounded shoulders, forward head, and increased thoracic kyphosis  PALPATION: Nontender to lumbar and  thoracic paraspinals  Notable piriformis tightness bilaterally with FADIR  LUMBAR ROM:   AROM eval  Flexion 80%  Extension 50%  Right lateral flexion To mid thigh*  Left lateral flexion To mid thigh*  Right rotation 50% *  Left rotation 50% *   (Blank rows = not tested)   *=pain   LOWER EXTREMITY MMT:    MMT Right eval Left eval  Hip flexion 4- 4-  Hip extension    Hip abduction 4- 4-  Hip adduction 4- 4-  Hip internal rotation    Hip external rotation    Knee flexion 4 4  Knee extension 4 4  Ankle dorsiflexion 4- 4-   (Blank rows = not tested)  LUMBAR SPECIAL TESTS:  Straight leg raise test: Negative and FABER test: Positive; FADIR; positive on B LE   FUNCTIONAL TESTS:  5 times sit to stand: 42.11 sec 6 minute walk test: NT on eval Dynamic Gait Index: NT on eval   GAIT: Distance walked: 78' Assistive device utilized: None Level of assistance: Modified independence Comments: decreased stance time bilaterally, mild L/R lateral lean    TODAY'S TREATMENT:                                                                                                                           DATE: 09/12/23  Subjective: Pt reports no pain in her L hip today; Pt. remains stressed about home remediation project.  Pt. Reports no LOB or issues walking at home.     Therex:  Nustep L4 for 10 mins B UE/LE at end of session to improve cardiovascular endurance  Resisted gait 2BTB 5x all 4-planes (light to no UE assist in //-bars)- mirror feedback for posture correction.  Cuing for recip. Step pattern/ heel strike.    Walking in hallway with consistent cadence/ step pattern.    Walking hurdles in //-bars with recip. Step pattern (6" and added 12" at end).    Sit to stands from varying heights on blue mat table (no UE assist).  Challenged at 19" height.    Step ups/downs with no UE assist.    Walking in grass with consistent, controlled step pattern at side of PT clinic.     Discussed HEP   Not today: Resisted Sidesteps in // bars with green TB around knees: 4 laps each direction Standing 3-way hip with green TB around knees: 1x10 each leg TRX Squats: 2x15 with chair for cueing for squat depth Hooklying Bridge: 2x15 - mild low back pain at start of first set, improved with repetition SLR: x10 each side - much improved since initial evaluation; no pain and pt demonstrates symmetrical ROM   PATIENT EDUCATION:  Education details: HEP, POC, goals Person educated: Patient Education method: Explanation, Demonstration, and Handouts Education comprehension: verbalized understanding and returned demonstration  HOME EXERCISE PROGRAM: Access Code: [email or web address] URL: [email or web address] Date: 07/18/2023 Prepared by: Maylon Peppers  Exercises - Standing Hip Abduction with Counter Support  - 2-3 x daily - 5-7 x weekly - 3 sets - 10 reps - Standing March with Counter Support  - 2-3 x daily - 5-7 x weekly - 3 sets - 10 reps - Heel Raises with Counter Support  - 2-3 x daily - 5-7 x weekly - 3 sets - 10 reps - Sit to Stand with Counter Support  - 2-3 x daily - 5-7 x weekly - 3 sets - 10 reps  ASSESSMENT:  CLINICAL IMPRESSION: Pt. presents to PT with no L hip pain. Exercises today focused on improving pt's core strength and engagement throughout various functional exercises, such as marching, walking. Pt reports no pain with the exercises, just mild fatigue.  Pt. Able to complete sit to stands with improved muscle control/ cadence. Pt will benefit from skilled PT interventions to address listed impairments to improve overall mobility and reduce pain during ADLs and walking.   OBJECTIVE IMPAIRMENTS: Abnormal gait, cardiopulmonary status limiting activity, decreased activity tolerance, decreased balance, decreased endurance, decreased mobility, difficulty walking, decreased strength, improper body mechanics, postural dysfunction, and pain.   ACTIVITY  LIMITATIONS: bending, standing, squatting, sleeping, stairs, transfers, and locomotion level  PARTICIPATION LIMITATIONS: cleaning, laundry, shopping, and community activity  PERSONAL FACTORS: Age, Behavior pattern, Fitness, and Past/current experiences are also affecting patient's functional outcome.   REHAB POTENTIAL: Good  CLINICAL DECISION MAKING: Stable/uncomplicated  EVALUATION COMPLEXITY: Low   GOALS: Goals reviewed with patient? Yes  SHORT TERM GOALS: Target date: 08/15/2023  Patient will be independent in HEP to improve strength/mobility for better functional independence with ADLs. Baseline: 8/29: HEP initiated Goal status: Partially met  LONG TERM GOALS: Target date: 09/12/2023  Patient will increase FOTO score to equal to or greater than 48 to demonstrate statistically significant improvement in mobility and quality of life.  Baseline: 8/29: 31 Goal status: On-going  2.  Patient (> 60 years  old) will complete five times sit to stand test in < 15 seconds indicating an increased LE strength and improved balance. Baseline: 8/29: 42.11 seconds Goal status: INITIAL  3.  Patient will increase six minute walk test distance by 200' for progression to community ambulator and improve gait ability. Baseline: 8/29: to be tested on 2nd visit Goal status: INITIAL  4.  Patient will increase BLE gross strength to 4+/5 as to improve functional strength for independent gait, increased standing tolerance and increased ADL ability. Baseline: 8/29: see above  Goal status: INITIAL  5.  Patient will increase Dynamic Gait Index (DGI) score to >19/24 as to demonstrate reduced fall risk and improved dynamic gait balance for better safety with community/home ambulation.  Baseline: 8/29: to be tested on 2nd visit  Goal status: INITIAL   PLAN:  PT FREQUENCY: 1-2x/week  PT DURATION: 8 weeks  PLANNED INTERVENTIONS: Therapeutic exercises, Therapeutic activity, Neuromuscular re-education,  Balance training, Gait training, Patient/Family education, Self Care, Joint mobilization, Stair training, DME instructions, Aquatic Therapy, Dry Needling, Spinal mobilization, Cryotherapy, Moist heat, and Manual therapy.  PLAN FOR NEXT SESSION: Teach pt how to get up from falls with no chair support, progress core/hip strengthening exercises, LE/trunk stretching.  CHECK GOALS/RECERTIFICATION  Cammie Mcgee, PT, DPT # [phone] 09/12/23, 8:03 AM

## 2023-09-16 ENCOUNTER — Ambulatory Visit: Payer: Medicare HMO | Admitting: Physical Therapy

## 2023-09-17 DIAGNOSIS — J301 Allergic rhinitis due to pollen: Secondary | ICD-10-CM | POA: Diagnosis not present

## 2023-09-17 DIAGNOSIS — M79604 Pain in right leg: Secondary | ICD-10-CM | POA: Diagnosis not present

## 2023-09-17 DIAGNOSIS — R9082 White matter disease, unspecified: Secondary | ICD-10-CM | POA: Diagnosis not present

## 2023-09-17 DIAGNOSIS — M545 Low back pain, unspecified: Secondary | ICD-10-CM | POA: Diagnosis not present

## 2023-09-17 DIAGNOSIS — M79605 Pain in left leg: Secondary | ICD-10-CM | POA: Diagnosis not present

## 2023-09-18 ENCOUNTER — Encounter: Payer: Self-pay | Admitting: Physical Therapy

## 2023-09-18 ENCOUNTER — Ambulatory Visit: Payer: Medicare HMO

## 2023-09-18 DIAGNOSIS — M6281 Muscle weakness (generalized): Secondary | ICD-10-CM | POA: Diagnosis not present

## 2023-09-18 DIAGNOSIS — R262 Difficulty in walking, not elsewhere classified: Secondary | ICD-10-CM | POA: Diagnosis not present

## 2023-09-18 DIAGNOSIS — M5459 Other low back pain: Secondary | ICD-10-CM | POA: Diagnosis not present

## 2023-09-18 NOTE — Therapy (Signed)
OUTPATIENT PHYSICAL THERAPY THORACOLUMBAR/LOWER EXTREMITY TREATMENT & RE-CERTIFICATION   Patient Name: Erin Camacho MRN: [id number] DOB:[birth year], 77 y.o., female Today's Date: 09/18/2023  END OF SESSION:  PT End of Session - 09/18/23 0953     Visit Number 15    Number of Visits 17    Date for PT Re-Evaluation 09/12/23    PT Start Time 0951    PT Stop Time 1035    PT Time Calculation (min) 44 min    Activity Tolerance Patient tolerated treatment well    Behavior During Therapy [facility] for tasks assessed/performed             Past Medical History:  Diagnosis Date   Allergy    hay fever   Anxiety    Arthritis    Chronic back pain    Diabetes mellitus    Dyspnea    a. 07/2017 Echo: EF 60-65%. No rwma. Mildly dil LA. Nl RV fxn.   GERD (gastroesophageal reflux disease)    History of hiatal hernia    Hyperlipidemia    Hypertension    Osteoarthritis    Pneumonia    Spinal stenosis    Type 2 diabetes mellitus (HCC)    UTI (lower urinary tract infection)    Past Surgical History:  Procedure Laterality Date   ABDOMINAL HYSTERECTOMY  1973   ABDOMINAL HYSTERECTOMY     APPENDECTOMY     APPLICATION OF ROBOTIC ASSISTANCE FOR SPINAL PROCEDURE N/A 09/15/2018   Procedure: APPLICATION OF ROBOTIC ASSISTANCE FOR SPINAL PROCEDURE;  Surgeon: Barnett Abu, MD;  Location: [facility] OR;  Service: Neurosurgery;  Laterality: N/A;   BACK SURGERY  04/17/13, 2017, 2019   CATARACT EXTRACTION W/PHACO Left 12/16/2019   Procedure: CATARACT EXTRACTION PHACO AND INTRAOCULAR LENS PLACEMENT (IOC) LEFT PANOPTIX LENS DIABETIC 8.07 01:19.2 10.2%;  Surgeon: Lockie Mola, MD;  Location: [facility];  Service: Ophthalmology;  Laterality: Left;  Diabetic - oral meds   CATARACT EXTRACTION W/PHACO Right 01/06/2020   Procedure: CATARACT EXTRACTION PHACO AND INTRAOCULAR LENS PLACEMENT (IOC) PANOPTIX LENS RIGHT DIABETIC 10.73 01:08.6 15.7%;  Surgeon: Lockie Mola, MD;  Location: [facility];  Service: Ophthalmology;  Laterality: Right;  diabetic   CHOLECYSTECTOMY  1995   COLONOSCOPY WITH PROPOFOL N/A 03/15/2021   Procedure: COLONOSCOPY WITH PROPOFOL;  Surgeon: Earline Mayotte, MD;  Location: [facility] ENDOSCOPY;  Service: Endoscopy;  Laterality: N/A;   ESOPHAGOGASTRODUODENOSCOPY N/A 06/20/2022   Procedure: ESOPHAGOGASTRODUODENOSCOPY (EGD);  Surgeon: Toledo, Boykin Nearing, MD;  Location: [facility] ENDOSCOPY;  Service: Gastroenterology;  Laterality: N/A;   EYE SURGERY  2021   bilateral cataract removal with lens implants   HERNIA REPAIR  08/2006   lap band  08/2003   LUMBAR LAMINECTOMY/ DECOMPRESSION WITH MET-RX Left 12/29/2020   Procedure: Left Thoracic Seven-Eight Microdiscectomy;  Surgeon: Barnett Abu, MD;  Location: [facility] OR;  Service: Neurosurgery;  Laterality: Left;   SEPTOPLASTY  04/1978   TONSILLECTOMY AND ADENOIDECTOMY  1979   TOTAL HIP ARTHROPLASTY  02/2012   Right, Dr. Ernest Pine   TOTAL HIP ARTHROPLASTY Left 08/31/2019   Procedure: TOTAL HIP ARTHROPLASTY;  Surgeon: Donato Heinz, MD;  Location: [facility] ORS;  Service: Orthopedics;  Laterality: Left;   UPPER GI ENDOSCOPY  06/20/2022   Patient Active Problem List   Diagnosis Date Noted   Trigger finger 07/03/2023   Leg pain 11/29/2022   Right leg numbness 08/20/2022   Memory change 08/17/2022   Bilateral carotid artery stenosis 01/31/2022   Balance problem 01/31/2022   History of  COVID-19 11/25/2021   Fall 05/22/2021   Herniated nucleus pulposus with myelopathy, thoracic 12/29/2020   Low back pain 09/15/2020   Opioid use (10 MME/day) 05/16/2020   Hypokalemia 04/28/2020   Chronic pain syndrome 04/20/2020   Disorder of skeletal system 04/20/2020   Chronic thoracic spine pain (Bilateral) (R>L) 04/20/2020   Myofascial pain syndrome of thoracic spine 04/20/2020   Chronic musculoskeletal pain 04/20/2020   Spasm of thoracic back muscle 04/20/2020   Weight loss 12/31/2019   History of total hip replacement (Left) 08/31/2019    Early satiety 07/05/2019   Lumbar stenosis with neurogenic claudication 09/15/2018   Shortness of breath 06/19/2018   Cough 04/13/2018   Aortic atherosclerosis (HCC) 01/11/2018   GERD (gastroesophageal reflux disease) 12/23/2016   Atrophic vaginitis 03/18/2016   Spondylolisthesis at L5-S1 level 12/06/2015   Rhinitis, allergic 11/22/2015   Right leg pain 07/27/2014   Neuritis or radiculitis due to rupture of lumbar intervertebral disc 06/28/2014   Chronic shoulder pain (Bilateral) 02/24/2014   History of bariatric surgery 01/18/2014   Status post bariatric surgery 01/18/2014   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   Depression, major, single episode, mild (HCC) 05/14/2013   Major depressive disorder, single episode 05/14/2013   Healthcare maintenance 01/07/2013   Hypercholesterolemia 08/22/2012   Hyperlipidemia 08/22/2012   Hypertension 07/07/2012   Diabetes mellitus type 2, diet-controlled (HCC) 07/07/2012   Insomnia 07/07/2012   Obesity (BMI 30-39.9) 07/07/2012   Essential (primary) hypertension 07/07/2012   Chronic low back pain (1ry area of Pain) (Bilateral) (R>L) w/o sciatica 10/18/2011    PCP: Dale Queens, MD  REFERRING PROVIDER: Dale Wayne Lakes, MD  REFERRING DIAG: M79.604,M79.605 (ICD-10-CM) - Pain in both lower extremities  Rationale for Evaluation and Treatment: Rehabilitation  THERAPY DIAG:  Difficulty in walking, not elsewhere classified  Other low back pain  Muscle weakness (generalized)  ONSET DATE: after 2013 (after R hip replacement)  SUBJECTIVE:                                                                                                                                                                                           SUBJECTIVE STATEMENT: Patient reports BLE weakness and numbness. Numbness is constant on anterolateral thighs but gets worse with standing >10 minutes. Patient unable to ambulate her block around her home. Difficulty  with stairs (step to pattern). Patient has had 4 back surgeries - L1-S1 fusion and "rods" T8/9-L1 starting in 2014. Has had L and R hip replacements.   PERTINENT HISTORY:  Patient has been seeing Dr. Danielle Dess for her back with radicular pain. Has had a fall 4-5 months ago and hurt her  L knee. Per Dr. Roby Lofts note from 07/05/23, patient reports weakness in upper thighs and limited in walking due to her legs. Dr. Lorin Picket discussed with patient about utilizing a Shermaine Brigham to help with patient reporting she does better in stores with a cart she can lean on.   PAIN:  Are you having pain? Yes: NPRS scale: 4-5/10 at worst  Pain location: B legs  Pain description: restless Aggravating factors: laying down, standing for prolonged periods of time  Relieving factors: movement, medication   PRECAUTIONS: Fall  WEIGHT BEARING RESTRICTIONS: No  FALLS:  Has patient fallen in last 6 months? Yes. Number of falls 1  LIVING ENVIRONMENT:   Lives with: lives with their spouse Lives in: House/apartment Stairs: No Has following equipment at home: Single point cane and Environmental consultant - 4 wheeled  OCCUPATION: retired   PLOF: Independent  PATIENT GOALS: to be able to walk a distance and not hurt   NEXT MD VISIT: 09/2023 (PCP)  OBJECTIVE:   DIAGNOSTIC FINDINGS:  N/A - unable to obtain due to outside provider   PATIENT SURVEYS:  FOTO 31 with goal of 48  SCREENING FOR RED FLAGS: Bowel or bladder incontinence: Yes: at times, patient reports unaware of bowel movement until trip to the restroom.  No issues with bladder incontinence.  Spinal tumors: No Cauda equina syndrome: No Compression fracture: No Abdominal aneurysm: No  COGNITION: Overall cognitive status: Within functional limits for tasks assessed     SENSATION: Sensory testing WFL, however patient reports numbness on anterolateral parts of B thighs  POSTURE: rounded shoulders, forward head, and increased thoracic kyphosis  PALPATION: Nontender to  lumbar and thoracic paraspinals  Notable piriformis tightness bilaterally with FADIR  LUMBAR ROM:   AROM eval  Flexion 80%  Extension 50%  Right lateral flexion To mid thigh*  Left lateral flexion To mid thigh*  Right rotation 50% *  Left rotation 50% *   (Blank rows = not tested)   *=pain   LOWER EXTREMITY MMT:    MMT Right eval Left eval Right  09/18/23 Left  09/18/23  Hip flexion 4- 4- 4 4  Hip extension      Hip abduction 4- 4- 4 4  Hip adduction 4- 4- 4 4  Hip internal rotation      Hip external rotation      Knee flexion 4 4 4+ 4+  Knee extension 4 4 4+ 4+  Ankle dorsiflexion 4- 4- 4 4   (Blank rows = not tested)  LUMBAR SPECIAL TESTS:  Straight leg raise test: Negative and FABER test: Positive; FADIR; positive on B LE   FUNCTIONAL TESTS:  5 times sit to stand: 42.11 sec 6 minute walk test: NT on eval Dynamic Gait Index: NT on eval   GAIT: Distance walked: 50' Assistive device utilized: None Level of assistance: Modified independence Comments: decreased stance time bilaterally, mild L/R lateral lean    TODAY'S TREATMENT:  DATE: 09/18/23   Subjective: Patient reports she went to the neurologist yesterday (Dr. Sherryll Burger) and he states she needs more physical therapy and the LE weakness/numbness is coming from her back.   Physical therapy treatment session today consisted of completing assessment of goals and administration of testing as demonstrated and documented in flow sheet, treatment, and goals section of this note. Addition treatments may be found below.   TA:   MMT BLE - see chart above 5xSTS: 18.78 seconds : 1,015 feet with no AD and no rest break  DGI: 17/24  FOTO: 46  HEP handout updated and provided to patient   Not today: Resisted Sidesteps in // bars with green TB around knees: 4 laps each direction Standing 3-way  hip with green TB around knees: 1x10 each leg TRX Squats: 2x15 with chair for cueing for squat depth Hooklying Bridge: 2x15 - mild low back pain at start of first set, improved with repetition SLR: x10 each side - much improved since initial evaluation; no pain and pt demonstrates symmetrical ROM   PATIENT EDUCATION:  Education details: HEP, POC, goals Person educated: Patient Education method: Explanation, Demonstration, and Handouts Education comprehension: verbalized understanding and returned demonstration  HOME EXERCISE PROGRAM: Access Code: [email or web address] URL: [email or web address] Date: 07/18/2023 Prepared by: Maylon Peppers  Exercises - Standing Hip Abduction with Counter Support  - 2-3 x daily - 5-7 x weekly - 3 sets - 10 reps - Standing March with Counter Support  - 2-3 x daily - 5-7 x weekly - 3 sets - 10 reps - Heel Raises with Counter Support  - 2-3 x daily - 5-7 x weekly - 3 sets - 10 reps - Sit to Stand with Counter Support  - 2-3 x daily - 5-7 x weekly - 3 sets - 10 reps - Seated Hip Adduction Isometrics with Ball  - 2-3 x daily - 5-7 x weekly - 3 sets - 10 reps - 5 second hold - Supine Bridge  - 2-3 x daily - 5-7 x weekly - 3 sets - 10 reps - Dead Bug  - 2-3 x daily - 5-7 x weekly - 3 sets - 10 reps - Supine Active Straight Leg Raise  - 2-3 x daily - 5-7 x weekly - 3 sets - 10 reps  ASSESSMENT:  CLINICAL IMPRESSION: Patient seen today for goal reassessment with patient demonstrating improvement in BLE strength, and 5xSTS (see results below). Patient reports improvement in numbness of anterior thighs since beginning of PT. Patient's condition has the potential to improve in response to therapy. Maximum improvement is yet to be obtained. The anticipated improvement is attainable and reasonable in a generally predictable time. Patient will benefit from skilled PT interventions to address listed impairments to improve overall mobility and reduce pain during  ADLs and walking.   OBJECTIVE IMPAIRMENTS: Abnormal gait, cardiopulmonary status limiting activity, decreased activity tolerance, decreased balance, decreased endurance, decreased mobility, difficulty walking, decreased strength, improper body mechanics, postural dysfunction, and pain.   ACTIVITY LIMITATIONS: bending, standing, squatting, sleeping, stairs, transfers, and locomotion level  PARTICIPATION LIMITATIONS: cleaning, laundry, shopping, and community activity  PERSONAL FACTORS: Age, Behavior pattern, Fitness, and Past/current experiences are also affecting patient's functional outcome.   REHAB POTENTIAL: Good  CLINICAL DECISION MAKING: Stable/uncomplicated  EVALUATION COMPLEXITY: Low   GOALS: Goals reviewed with patient? Yes  SHORT TERM GOALS: Target date: 08/15/2023  Patient will be independent in HEP to improve strength/mobility for better functional independence with ADLs. Baseline: 8/29: HEP initiated 10/30: HEP  updated and independent Goal status: MET   LONG TERM GOALS: Target date: 09/12/2023  Patient will increase FOTO score to equal to or greater than 48 to demonstrate statistically significant improvement in mobility and quality of life.  Baseline: 8/29: 31; 10/30: 46 Goal status: On-going  2.  Patient (> 41 years old) will complete five times sit to stand test in < 15 seconds indicating an increased LE strength and improved balance. Baseline: 8/29: 42.11 seconds; 10/9: 34.5 seconds; 10/30: 18.78 seconds Goal status: ONGOING  3.  Patient will increase six minute walk test distance by 200' for progression to community ambulator and improve gait ability. Baseline: 8/29: to be tested on 2nd visit; 10/9: 639' with rest break; 10/30: 1015' with no rest and no AD  Goal status: ONGOING  4.  Patient will increase BLE gross strength to 4+/5 as to improve functional strength for independent gait, increased standing tolerance and increased ADL ability. Baseline: 8/29: see  above; 10/30: see above  Goal status: ONGOING  5.  Patient will increase Dynamic Gait Index (DGI) score to >19/24 as to demonstrate reduced fall risk and improved dynamic gait balance for better safety with community/home ambulation.  Baseline: 8/29: to be tested on 2nd visit 10/30: 17/24 Goal status: ONGOING   PLAN:  PT FREQUENCY: 1-2x/week  PT DURATION: 6 weeks  PLANNED INTERVENTIONS: Therapeutic exercises, Therapeutic activity, Neuromuscular re-education, Balance training, Gait training, Patient/Family education, Self Care, Joint mobilization, Stair training, DME instructions, Aquatic Therapy, Dry Needling, Spinal mobilization, Cryotherapy, Moist heat, and Manual therapy.  PLAN FOR NEXT SESSION: Teach pt how to get up from falls with no chair support, progress core/hip strengthening exercises, LE/trunk stretching.   Maylon Peppers, PT, DPT Physical Therapist - [facility]  09/18/23, 9:55 AM

## 2023-09-20 ENCOUNTER — Other Ambulatory Visit: Payer: Self-pay | Admitting: Internal Medicine

## 2023-09-23 ENCOUNTER — Encounter: Payer: Self-pay | Admitting: Physical Therapy

## 2023-09-23 ENCOUNTER — Ambulatory Visit: Payer: Medicare HMO | Attending: Internal Medicine | Admitting: Physical Therapy

## 2023-09-23 DIAGNOSIS — M6281 Muscle weakness (generalized): Secondary | ICD-10-CM | POA: Diagnosis present

## 2023-09-23 DIAGNOSIS — R262 Difficulty in walking, not elsewhere classified: Secondary | ICD-10-CM | POA: Diagnosis present

## 2023-09-23 DIAGNOSIS — M5459 Other low back pain: Secondary | ICD-10-CM | POA: Diagnosis not present

## 2023-09-23 NOTE — Therapy (Signed)
OUTPATIENT PHYSICAL THERAPY THORACOLUMBAR/LOWER EXTREMITY TREATMENT & RE-CERTIFICATION   Patient Name: Erin Camacho MRN: [id number] DOB:January 01, 1946, 77 y.o., female Today's Date: 09/23/2023  END OF SESSION:  PT End of Session - 09/23/23 1153     Visit Number 16    Number of Visits 27    Date for PT Re-Evaluation 10/30/23    PT Start Time 1019    PT Stop Time 1117    PT Time Calculation (min) 58 min    Activity Tolerance Patient tolerated treatment well    Behavior During Therapy [facility] for tasks assessed/performed             Past Medical History:  Diagnosis Date   Allergy    hay fever   Anxiety    Arthritis    Chronic back pain    Diabetes mellitus    Dyspnea    a. 07/2017 Echo: EF 60-65%. No rwma. Mildly dil LA. Nl RV fxn.   GERD (gastroesophageal reflux disease)    History of hiatal hernia    Hyperlipidemia    Hypertension    Osteoarthritis    Pneumonia    Spinal stenosis    Type 2 diabetes mellitus (HCC)    UTI (lower urinary tract infection)    Past Surgical History:  Procedure Laterality Date   ABDOMINAL HYSTERECTOMY  1973   ABDOMINAL HYSTERECTOMY     APPENDECTOMY     APPLICATION OF ROBOTIC ASSISTANCE FOR SPINAL PROCEDURE N/A 09/15/2018   Procedure: APPLICATION OF ROBOTIC ASSISTANCE FOR SPINAL PROCEDURE;  Surgeon: Barnett Abu, MD;  Location: [facility] OR;  Service: Neurosurgery;  Laterality: N/A;   BACK SURGERY  04/17/13, 2017, 2019   CATARACT EXTRACTION W/PHACO Left 12/16/2019   Procedure: CATARACT EXTRACTION PHACO AND INTRAOCULAR LENS PLACEMENT (IOC) LEFT PANOPTIX LENS DIABETIC 8.07 01:19.2 10.2%;  Surgeon: Lockie Mola, MD;  Location: [facility];  Service: Ophthalmology;  Laterality: Left;  Diabetic - oral meds   CATARACT EXTRACTION W/PHACO Right 01/06/2020   Procedure: CATARACT EXTRACTION PHACO AND INTRAOCULAR LENS PLACEMENT (IOC) PANOPTIX LENS RIGHT DIABETIC 10.73 01:08.6 15.7%;  Surgeon: Lockie Mola, MD;  Location: [facility];  Service: Ophthalmology;  Laterality: Right;  diabetic   CHOLECYSTECTOMY  1995   COLONOSCOPY WITH PROPOFOL N/A 03/15/2021   Procedure: COLONOSCOPY WITH PROPOFOL;  Surgeon: Earline Mayotte, MD;  Location: [facility] ENDOSCOPY;  Service: Endoscopy;  Laterality: N/A;   ESOPHAGOGASTRODUODENOSCOPY N/A 06/20/2022   Procedure: ESOPHAGOGASTRODUODENOSCOPY (EGD);  Surgeon: Toledo, Boykin Nearing, MD;  Location: [facility] ENDOSCOPY;  Service: Gastroenterology;  Laterality: N/A;   EYE SURGERY  2021   bilateral cataract removal with lens implants   HERNIA REPAIR  08/2006   lap band  08/2003   LUMBAR LAMINECTOMY/ DECOMPRESSION WITH MET-RX Left 12/29/2020   Procedure: Left Thoracic Seven-Eight Microdiscectomy;  Surgeon: Barnett Abu, MD;  Location: [facility] OR;  Service: Neurosurgery;  Laterality: Left;   SEPTOPLASTY  04/1978   TONSILLECTOMY AND ADENOIDECTOMY  1979   TOTAL HIP ARTHROPLASTY  02/2012   Right, Dr. Ernest Pine   TOTAL HIP ARTHROPLASTY Left 08/31/2019   Procedure: TOTAL HIP ARTHROPLASTY;  Surgeon: Donato Heinz, MD;  Location: [facility] ORS;  Service: Orthopedics;  Laterality: Left;   UPPER GI ENDOSCOPY  06/20/2022   Patient Active Problem List   Diagnosis Date Noted   Trigger finger 07/03/2023   Leg pain 11/29/2022   Right leg numbness 08/20/2022   Memory change 08/17/2022   Bilateral carotid artery stenosis 01/31/2022   Balance problem 01/31/2022   History of  COVID-19 11/25/2021   Fall 05/22/2021   Herniated nucleus pulposus with myelopathy, thoracic 12/29/2020   Low back pain 09/15/2020   Opioid use (10 MME/day) 05/16/2020   Hypokalemia 04/28/2020   Chronic pain syndrome 04/20/2020   Disorder of skeletal system 04/20/2020   Chronic thoracic spine pain (Bilateral) (R>L) 04/20/2020   Myofascial pain syndrome of thoracic spine 04/20/2020   Chronic musculoskeletal pain 04/20/2020   Spasm of thoracic back muscle 04/20/2020   Weight loss 12/31/2019   History of total hip replacement (Left) 08/31/2019    Early satiety 07/05/2019   Lumbar stenosis with neurogenic claudication 09/15/2018   Shortness of breath 06/19/2018   Cough 04/13/2018   Aortic atherosclerosis (HCC) 01/11/2018   GERD (gastroesophageal reflux disease) 12/23/2016   Atrophic vaginitis 03/18/2016   Spondylolisthesis at L5-S1 level 12/06/2015   Rhinitis, allergic 11/22/2015   Right leg pain 07/27/2014   Neuritis or radiculitis due to rupture of lumbar intervertebral disc 06/28/2014   Chronic shoulder pain (Bilateral) 02/24/2014   History of bariatric surgery 01/18/2014   Status post bariatric surgery 01/18/2014   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   Depression, major, single episode, mild (HCC) 05/14/2013   Major depressive disorder, single episode 05/14/2013   Healthcare maintenance 01/07/2013   Hypercholesterolemia 08/22/2012   Hyperlipidemia 08/22/2012   Hypertension 07/07/2012   Diabetes mellitus type 2, diet-controlled (HCC) 07/07/2012   Insomnia 07/07/2012   Obesity (BMI 30-39.9) 07/07/2012   Essential (primary) hypertension 07/07/2012   Chronic low back pain (1ry area of Pain) (Bilateral) (R>L) w/o sciatica 10/18/2011    PCP: Dale Schuyl[place], MD  REFERRING PROVIDER: Dale [place], MD  REFERRING DIAG: M79.604,M79.605 (ICD-10-CM) - Pain in both lower extremities  Rationale for Evaluation and Treatment: Rehabilitation  THERAPY DIAG:  Difficulty in walking, not elsewhere classified  Other low back pain  Muscle weakness (generalized)  ONSET DATE: after 2013 (after R hip replacement)  SUBJECTIVE:                                                                                                                                                                                           SUBJECTIVE STATEMENT: Patient reports BLE weakness and numbness. Numbness is constant on anterolateral thighs but gets worse with standing >10 minutes. Patient unable to ambulate her block around her home. Difficulty  with stairs (step to pattern). Patient has had 4 back surgeries - L1-S1 fusion and "rods" T8/9-L1 starting in 2014. Has had L and R hip replacements.   PERTINENT HISTORY:  Patient has been seeing Dr. Danielle Dess for her back with radicular pain. Has had a fall 4-5 months ago and hurt her  L knee. Per Dr. Roby Lofts note from 07/05/23, patient reports weakness in upper thighs and limited in walking due to her legs. Dr. Lorin Picket discussed with patient about utilizing a walker to help with patient reporting she does better in stores with a cart she can lean on.   PAIN:  Are you having pain? Yes: NPRS scale: 4-5/10 at worst  Pain location: B legs  Pain description: restless Aggravating factors: laying down, standing for prolonged periods of time  Relieving factors: movement, medication   PRECAUTIONS: Fall  WEIGHT BEARING RESTRICTIONS: No  FALLS:  Has patient fallen in last 6 months? Yes. Number of falls 1  LIVING ENVIRONMENT:   Lives with: lives with their spouse Lives in: House/apartment Stairs: No Has following equipment at home: Single point cane and Environmental consultant - 4 wheeled  OCCUPATION: retired   PLOF: Independent  PATIENT GOALS: to be able to walk a distance and not hurt   NEXT MD VISIT: 09/2023 (PCP)  OBJECTIVE:   DIAGNOSTIC FINDINGS:  N/A - unable to obtain due to outside provider   PATIENT SURVEYS:  FOTO 31 with goal of 48  SCREENING FOR RED FLAGS: Bowel or bladder incontinence: Yes: at times, patient reports unaware of bowel movement until trip to the restroom.  No issues with bladder incontinence.  Spinal tumors: No Cauda equina syndrome: No Compression fracture: No Abdominal aneurysm: No  COGNITION: Overall cognitive status: Within functional limits for tasks assessed     SENSATION: Sensory testing WFL, however patient reports numbness on anterolateral parts of B thighs  POSTURE: rounded shoulders, forward head, and increased thoracic kyphosis  PALPATION: Nontender to  lumbar and thoracic paraspinals  Notable piriformis tightness bilaterally with FADIR  LUMBAR ROM:   AROM eval  Flexion 80%  Extension 50%  Right lateral flexion To mid thigh*  Left lateral flexion To mid thigh*  Right rotation 50% *  Left rotation 50% *   (Blank rows = not tested)   *=pain   LOWER EXTREMITY MMT:    MMT Right eval Left eval Right  09/18/23 Left  09/18/23  Hip flexion 4- 4- 4 4  Hip extension      Hip abduction 4- 4- 4 4  Hip adduction 4- 4- 4 4  Hip internal rotation      Hip external rotation      Knee flexion 4 4 4+ 4+  Knee extension 4 4 4+ 4+  Ankle dorsiflexion 4- 4- 4 4   (Blank rows = not tested)  LUMBAR SPECIAL TESTS:  Straight leg raise test: Negative and FABER test: Positive; FADIR; positive on B LE   FUNCTIONAL TESTS:  5 times sit to stand: 42.11 sec 6 minute walk test: NT on eval Dynamic Gait Index: NT on eval   GAIT: Distance walked: 85' Assistive device utilized: None Level of assistance: Modified independence Comments: decreased stance time bilaterally, mild L/R lateral lean    09/18/23:  MMT BLE - see chart above 5xSTS: 18.78 seconds : 1,015 feet with no AD and no rest break  DGI: 17/24  FOTO: 46   TODAY'S TREATMENT:  DATE: 09/23/23   Subjective: See new PT order (scanned in chart).  Pt. States she had an increase in back pain after sitting during dinner last Thursday.  Pt. States she had a sudden movement which may have caused pain and symptoms resolved by weekend.    There.ex.:  Nustep L4 10 min. B UE/LE (consistent cadence)- 0.33 miles.  Discussed weekend activities and recent pain in back .   Resisted gait 2BTB: 6x all 4-planes with light to no UE assist.    TRX squats 15x2 (cuing for proper technique).  Walking in hallway with consistent recip. Pattern and arm swing.    Neuro.mm.:  Alt. UE/LE touches in //-bars with occasional UE assist on //-bars for safety.  5 laps.   Cone taps in //-bars with recip. Step pattern   Recip. Stairs with 1 UE assist on handrail.  Updated goals.     PATIENT EDUCATION:  Education details: HEP, POC, goals Person educated: Patient Education method: Explanation, Demonstration, and Handouts Education comprehension: verbalized understanding and returned demonstration  HOME EXERCISE PROGRAM: Access Code: [email or web address] URL: [email or web address] Date: 07/18/2023 Prepared by: Maylon Peppers  Exercises - Standing Hip Abduction with Counter Support  - 2-3 x daily - 5-7 x weekly - 3 sets - 10 reps - Standing March with Counter Support  - 2-3 x daily - 5-7 x weekly - 3 sets - 10 reps - Heel Raises with Counter Support  - 2-3 x daily - 5-7 x weekly - 3 sets - 10 reps - Sit to Stand with Counter Support  - 2-3 x daily - 5-7 x weekly - 3 sets - 10 reps - Seated Hip Adduction Isometrics with Ball  - 2-3 x daily - 5-7 x weekly - 3 sets - 10 reps - 5 second hold - Supine Bridge  - 2-3 x daily - 5-7 x weekly - 3 sets - 10 reps - Dead Bug  - 2-3 x daily - 5-7 x weekly - 3 sets - 10 reps - Supine Active Straight Leg Raise  - 2-3 x daily - 5-7 x weekly - 3 sets - 10 reps  ASSESSMENT:  CLINICAL IMPRESSION: Patient reports improvement in numbness of anterior thigh since beginning of PT. Patient's condition has the potential to improve in response to therapy. Maximum improvement is yet to be obtained. The anticipated improvement is attainable and reasonable in a generally predictable time. Patient will benefit from skilled PT interventions to address listed impairments to improve overall mobility and reduce pain during ADLs and walking.   OBJECTIVE IMPAIRMENTS: Abnormal gait, cardiopulmonary status limiting activity, decreased activity tolerance, decreased balance, decreased endurance, decreased mobility, difficulty walking,  decreased strength, improper body mechanics, postural dysfunction, and pain.   ACTIVITY LIMITATIONS: bending, standing, squatting, sleeping, stairs, transfers, and locomotion level  PARTICIPATION LIMITATIONS: cleaning, laundry, shopping, and community activity  PERSONAL FACTORS: Age, Behavior pattern, Fitness, and Past/current experiences are also affecting patient's functional outcome.   REHAB POTENTIAL: Good  CLINICAL DECISION MAKING: Stable/uncomplicated  EVALUATION COMPLEXITY: Low   GOALS: Goals reviewed with patient? Yes  LONG TERM GOALS: Target date: 10/30/2023  Patient will increase FOTO score to equal to or greater than 48 to demonstrate statistically significant improvement in mobility and quality of life.  Baseline: 8/29: 31; 10/30: 46 Goal status: Partially met  2.  Patient (> 65 years old) will complete five times sit to stand test in < 15 seconds indicating an increased LE strength and improved balance. Baseline: 8/29: 42.11 seconds; 10/9: 34.5 seconds;  10/30: 18.78 seconds Goal status: Partially met  3.  Patient will increase six minute walk test distance by 200' for progression to community ambulator and improve gait ability. Baseline: 8/29: to be tested on 2nd visit; 10/9: 639' with rest break; 10/30: 1015' with no rest and no AD  Goal status: Partially met  4.  Patient will increase BLE gross strength to 4+/5 as to improve functional strength for independent gait, increased standing tolerance and increased ADL ability. Baseline: 8/29: see above; 10/30: see above  Goal status: Partially met  5.  Patient will increase Dynamic Gait Index (DGI) score to >19/24 as to demonstrate reduced fall risk and improved dynamic gait balance for better safety with community/home ambulation.  Baseline: 8/29: to be tested on 2nd visit 10/30: 17/24 Goal status: Partially met   PLAN:  PT FREQUENCY: 1-2x/week  PT DURATION: 6 weeks  PLANNED INTERVENTIONS: Therapeutic  exercises, Therapeutic activity, Neuromuscular re-education, Balance training, Gait training, Patient/Family education, Self Care, Joint mobilization, Stair training, DME instructions, Aquatic Therapy, Dry Needling, Spinal mobilization, Cryotherapy, Moist heat, and Manual therapy.  PLAN FOR NEXT SESSION: Teach pt how to get up from falls with no chair support, progress core/hip strengthening exercises, LE/trunk stretching.   Cammie Mcgee, PT, DPT # [phone] Physical Therapist - [facility]  09/23/23, 12:01 PM

## 2023-09-25 ENCOUNTER — Ambulatory Visit: Payer: Medicare HMO | Admitting: Physical Therapy

## 2023-09-25 ENCOUNTER — Encounter: Payer: Self-pay | Admitting: Physical Therapy

## 2023-09-25 DIAGNOSIS — R262 Difficulty in walking, not elsewhere classified: Secondary | ICD-10-CM

## 2023-09-25 DIAGNOSIS — M6281 Muscle weakness (generalized): Secondary | ICD-10-CM | POA: Diagnosis not present

## 2023-09-25 DIAGNOSIS — M5459 Other low back pain: Secondary | ICD-10-CM

## 2023-09-25 NOTE — Therapy (Signed)
OUTPATIENT PHYSICAL THERAPY THORACOLUMBAR/LOWER EXTREMITY TREATMENT   Patient Name: Erin Camacho MRN: [id number] DOB:[birth year], 77 y.o., female Today's Date: 09/25/2023  END OF SESSION:  PT End of Session - 09/25/23 1041     Visit Number 17    Number of Visits 27    Date for PT Re-Evaluation 10/30/23    PT Start Time 1028    PT Stop Time 1116    PT Time Calculation (min) 48 min    Activity Tolerance Patient tolerated treatment well    Behavior During Therapy [facility] for tasks assessed/performed             Past Medical History:  Diagnosis Date   Allergy    hay fever   Anxiety    Arthritis    Chronic back pain    Diabetes mellitus    Dyspnea    a. 07/2017 Echo: EF 60-65%. No rwma. Mildly dil LA. Nl RV fxn.   GERD (gastroesophageal reflux disease)    History of hiatal hernia    Hyperlipidemia    Hypertension    Osteoarthritis    Pneumonia    Spinal stenosis    Type 2 diabetes mellitus (HCC)    UTI (lower urinary tract infection)    Past Surgical History:  Procedure Laterality Date   ABDOMINAL HYSTERECTOMY  1973   ABDOMINAL HYSTERECTOMY     APPENDECTOMY     APPLICATION OF ROBOTIC ASSISTANCE FOR SPINAL PROCEDURE N/A 09/15/2018   Procedure: APPLICATION OF ROBOTIC ASSISTANCE FOR SPINAL PROCEDURE;  Surgeon: Barnett Abu, MD;  Location: [facility] OR;  Service: Neurosurgery;  Laterality: N/A;   BACK SURGERY  04/17/13, 2017, 2019   CATARACT EXTRACTION W/PHACO Left 12/16/2019   Procedure: CATARACT EXTRACTION PHACO AND INTRAOCULAR LENS PLACEMENT (IOC) LEFT PANOPTIX LENS DIABETIC 8.07 01:19.2 10.2%;  Surgeon: Lockie Mola, MD;  Location: [facility];  Service: Ophthalmology;  Laterality: Left;  Diabetic - oral meds   CATARACT EXTRACTION W/PHACO Right 01/06/2020   Procedure: CATARACT EXTRACTION PHACO AND INTRAOCULAR LENS PLACEMENT (IOC) PANOPTIX LENS RIGHT DIABETIC 10.73 01:08.6 15.7%;  Surgeon: Lockie Mola, MD;  Location: [facility];  Service:  Ophthalmology;  Laterality: Right;  diabetic   CHOLECYSTECTOMY  1995   COLONOSCOPY WITH PROPOFOL N/A 03/15/2021   Procedure: COLONOSCOPY WITH PROPOFOL;  Surgeon: Earline Mayotte, MD;  Location: [facility];  Service: Endoscopy;  Laterality: N/A;   ESOPHAGOGASTRODUODENOSCOPY N/A 06/20/2022   Procedure: ESOPHAGOGASTRODUODENOSCOPY (EGD);  Surgeon: Toledo, Boykin Nearing, MD;  Location: [facility];  Service: Gastroenterology;  Laterality: N/A;   EYE SURGERY  2021   bilateral cataract removal with lens implants   HERNIA REPAIR  08/2006   lap band  08/2003   LUMBAR LAMINECTOMY/ DECOMPRESSION WITH MET-RX Left 12/29/2020   Procedure: Left Thoracic Seven-Eight Microdiscectomy;  Surgeon: Barnett Abu, MD;  Location: [facility];  Service: Neurosurgery;  Laterality: Left;   SEPTOPLASTY  04/1978   TONSILLECTOMY AND ADENOIDECTOMY  1979   TOTAL HIP ARTHROPLASTY  02/2012   Right, Dr. Ernest Pine   TOTAL HIP ARTHROPLASTY Left 08/31/2019   Procedure: TOTAL HIP ARTHROPLASTY;  Surgeon: Donato Heinz, MD;  Location: [facility];  Service: Orthopedics;  Laterality: Left;   UPPER GI ENDOSCOPY  06/20/2022   Patient Active Problem List   Diagnosis Date Noted   Trigger finger 07/03/2023   Leg pain 11/29/2022   Right leg numbness 08/20/2022   Memory change 08/17/2022   Bilateral carotid artery stenosis 01/31/2022   Balance problem 01/31/2022   History of COVID-19 11/25/2021  Fall 05/22/2021   Herniated nucleus pulposus with myelopathy, thoracic 12/29/2020   Low back pain 09/15/2020   Opioid use (10 MME/day) 05/16/2020   Hypokalemia 04/28/2020   Chronic pain syndrome 04/20/2020   Disorder of skeletal system 04/20/2020   Chronic thoracic spine pain (Bilateral) (R>L) 04/20/2020   Myofascial pain syndrome of thoracic spine 04/20/2020   Chronic musculoskeletal pain 04/20/2020   Spasm of thoracic back muscle 04/20/2020   Weight loss 12/31/2019   History of total hip replacement (Left) 08/31/2019   Early satiety  07/05/2019   Lumbar stenosis with neurogenic claudication 09/15/2018   Shortness of breath 06/19/2018   Cough 04/13/2018   Aortic atherosclerosis (HCC) 01/11/2018   GERD (gastroesophageal reflux disease) 12/23/2016   Atrophic vaginitis 03/18/2016   Spondylolisthesis at L5-S1 level 12/06/2015   Rhinitis, allergic 11/22/2015   Right leg pain 07/27/2014   Neuritis or radiculitis due to rupture of lumbar intervertebral disc 06/28/2014   Chronic shoulder pain (Bilateral) 02/24/2014   History of bariatric surgery 01/18/2014   Status post bariatric surgery 01/18/2014   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   Depression, major, single episode, mild (HCC) 05/14/2013   Major depressive disorder, single episode 05/14/2013   Healthcare maintenance 01/07/2013   Hypercholesterolemia 08/22/2012   Hyperlipidemia 08/22/2012   Hypertension 07/07/2012   Diabetes mellitus type 2, diet-controlled (HCC) 07/07/2012   Insomnia 07/07/2012   Obesity (BMI 30-39.9) 07/07/2012   Essential (primary) hypertension 07/07/2012   Chronic low back pain (1ry area of Pain) (Bilateral) (R>L) w/o sciatica 10/18/2011    PCP: Dale [place], MD  REFERRING PROVIDER: Dale Lapel, MD  REFERRING DIAG: M79.604,M79.605 (ICD-10-CM) - Pain in both lower extremities  Rationale for Evaluation and Treatment: Rehabilitation  THERAPY DIAG:  Difficulty in walking, not elsewhere classified  Other low back pain  Muscle weakness (generalized)  ONSET DATE: after 2013 (after R hip replacement)  SUBJECTIVE:                                                                                                                                                                                           SUBJECTIVE STATEMENT: Patient reports BLE weakness and numbness. Numbness is constant on anterolateral thighs but gets worse with standing >10 minutes. Patient unable to ambulate her block around her home. Difficulty with stairs  (step to pattern). Patient has had 4 back surgeries - L1-S1 fusion and "rods" T8/9-L1 starting in 2014. Has had L and R hip replacements.   PERTINENT HISTORY:  Patient has been seeing Dr. Danielle Dess for her back with radicular pain. Has had a fall 4-5 months ago and hurt her L knee. Per Dr.  Scott's note from 07/05/23, patient reports weakness in upper thighs and limited in walking due to her legs. Dr. Lorin Picket discussed with patient about utilizing a walker to help with patient reporting she does better in stores with a cart she can lean on.   PAIN:  Are you having pain? Yes: NPRS scale: 4-5/10 at worst  Pain location: B legs  Pain description: restless Aggravating factors: laying down, standing for prolonged periods of time  Relieving factors: movement, medication   PRECAUTIONS: Fall  WEIGHT BEARING RESTRICTIONS: No  FALLS:  Has patient fallen in last 6 months? Yes. Number of falls 1  LIVING ENVIRONMENT:   Lives with: lives with their spouse Lives in: House/apartment Stairs: No Has following equipment at home: Single point cane and Environmental consultant - 4 wheeled  OCCUPATION: retired   PLOF: Independent  PATIENT GOALS: to be able to walk a distance and not hurt   NEXT MD VISIT: 09/2023 (PCP)  OBJECTIVE:   DIAGNOSTIC FINDINGS:  N/A - unable to obtain due to outside provider   PATIENT SURVEYS:  FOTO 31 with goal of 48  SCREENING FOR RED FLAGS: Bowel or bladder incontinence: Yes: at times, patient reports unaware of bowel movement until trip to the restroom.  No issues with bladder incontinence.  Spinal tumors: No Cauda equina syndrome: No Compression fracture: No Abdominal aneurysm: No  COGNITION: Overall cognitive status: Within functional limits for tasks assessed     SENSATION: Sensory testing WFL, however patient reports numbness on anterolateral parts of B thighs  POSTURE: rounded shoulders, forward head, and increased thoracic kyphosis  PALPATION: Nontender to lumbar and  thoracic paraspinals  Notable piriformis tightness bilaterally with FADIR  LUMBAR ROM:   AROM eval  Flexion 80%  Extension 50%  Right lateral flexion To mid thigh*  Left lateral flexion To mid thigh*  Right rotation 50% *  Left rotation 50% *   (Blank rows = not tested)   *=pain   LOWER EXTREMITY MMT:    MMT Right eval Left eval Right  09/18/23 Left  09/18/23  Hip flexion 4- 4- 4 4  Hip extension      Hip abduction 4- 4- 4 4  Hip adduction 4- 4- 4 4  Hip internal rotation      Hip external rotation      Knee flexion 4 4 4+ 4+  Knee extension 4 4 4+ 4+  Ankle dorsiflexion 4- 4- 4 4   (Blank rows = not tested)  LUMBAR SPECIAL TESTS:  Straight leg raise test: Negative and FABER test: Positive; FADIR; positive on B LE   FUNCTIONAL TESTS:  5 times sit to stand: 42.11 sec 6 minute walk test: NT on eval Dynamic Gait Index: NT on eval   GAIT: Distance walked: 81' Assistive device utilized: None Level of assistance: Modified independence Comments: decreased stance time bilaterally, mild L/R lateral lean    09/18/23:  MMT BLE - see chart above 5xSTS: 18.78 seconds : 1,015 feet with no AD and no rest break  DGI: 17/24  FOTO: 46   TODAY'S TREATMENT:  DATE: 09/25/23   Subjective: Pt. Entered PT with no new complaints and no back pain at this time.  Pt. States she has balance issues and not comfortable walking on uneven surfaces.    There.ex.:  Nustep L5 10 min. B UE/LE (consistent cadence)- 0.32 miles.    Kneeling to standing in //-bars with mod. Assistance.  Use of Airex pads on knees and PT assist to return to stand.    Resisted gait 2BTB: 5x all 4-planes with light to no UE assist.  Cuing to keep R foot in midline position.  Mirror feedback for posture correction.    TRX squats 15x2 (cuing for proper  technique).   Neuro.mm.:  Walking in hallway with consistent recip. Pattern and arm swing.  Added ball toss/ rotation/ alt. UE and LE (challenged with alt. Ex.)- increase SOB.  Short seated rest break.  Cone taps in //-bars with recip. Step pattern and 6" hurdles recip. Steps.    Discussed HEP    PATIENT EDUCATION:  Education details: HEP, POC, goals Person educated: Patient Education method: Explanation, Demonstration, and Handouts Education comprehension: verbalized understanding and returned demonstration  HOME EXERCISE PROGRAM: Access Code: [email or web address] URL: [email or web address] Date: 07/18/2023 Prepared by: Maylon Peppers  Exercises - Standing Hip Abduction with Counter Support  - 2-3 x daily - 5-7 x weekly - 3 sets - 10 reps - Standing March with Counter Support  - 2-3 x daily - 5-7 x weekly - 3 sets - 10 reps - Heel Raises with Counter Support  - 2-3 x daily - 5-7 x weekly - 3 sets - 10 reps - Sit to Stand with Counter Support  - 2-3 x daily - 5-7 x weekly - 3 sets - 10 reps - Seated Hip Adduction Isometrics with Ball  - 2-3 x daily - 5-7 x weekly - 3 sets - 10 reps - 5 second hold - Supine Bridge  - 2-3 x daily - 5-7 x weekly - 3 sets - 10 reps - Dead Bug  - 2-3 x daily - 5-7 x weekly - 3 sets - 10 reps - Supine Active Straight Leg Raise  - 2-3 x daily - 5-7 x weekly - 3 sets - 10 reps  ASSESSMENT:  CLINICAL IMPRESSION: Patient works hard with skilled PT services to increase B LE strength to improve balance/ gait.  Moderate fatigue noted during hallway tasks/ balance.  No LOB and pt. Ambulates with more consistent heel strike.  Patient's condition has the potential to improve in response to therapy. Maximum improvement is yet to be obtained. The anticipated improvement is attainable and reasonable in a generally predictable time. Patient will benefit from skilled PT interventions to address listed impairments to improve overall mobility and reduce pain during  ADLs and walking.   OBJECTIVE IMPAIRMENTS: Abnormal gait, cardiopulmonary status limiting activity, decreased activity tolerance, decreased balance, decreased endurance, decreased mobility, difficulty walking, decreased strength, improper body mechanics, postural dysfunction, and pain.   ACTIVITY LIMITATIONS: bending, standing, squatting, sleeping, stairs, transfers, and locomotion level  PARTICIPATION LIMITATIONS: cleaning, laundry, shopping, and community activity  PERSONAL FACTORS: Age, Behavior pattern, Fitness, and Past/current experiences are also affecting patient's functional outcome.   REHAB POTENTIAL: Good  CLINICAL DECISION MAKING: Stable/uncomplicated  EVALUATION COMPLEXITY: Low   GOALS: Goals reviewed with patient? Yes  LONG TERM GOALS: Target date: 10/30/2023  Patient will increase FOTO score to equal to or greater than 48 to demonstrate statistically significant improvement in mobility and quality of life.  Baseline: 8/29: 31; 10/30:  46 Goal status: Partially met  2.  Patient (> 55 years old) will complete five times sit to stand test in < 15 seconds indicating an increased LE strength and improved balance. Baseline: 8/29: 42.11 seconds; 10/9: 34.5 seconds; 10/30: 18.78 seconds Goal status: Partially met  3.  Patient will increase six minute walk test distance by 200' for progression to community ambulator and improve gait ability. Baseline: 8/29: to be tested on 2nd visit; 10/9: 639' with rest break; 10/30: 1015' with no rest and no AD  Goal status: Partially met  4.  Patient will increase BLE gross strength to 4+/5 as to improve functional strength for independent gait, increased standing tolerance and increased ADL ability. Baseline: 8/29: see above; 10/30: see above  Goal status: Partially met  5.  Patient will increase Dynamic Gait Index (DGI) score to >19/24 as to demonstrate reduced fall risk and improved dynamic gait balance for better safety with  community/home ambulation.  Baseline: 8/29: to be tested on 2nd visit 10/30: 17/24 Goal status: Partially met   PLAN:  PT FREQUENCY: 1-2x/week  PT DURATION: 6 weeks  PLANNED INTERVENTIONS: Therapeutic exercises, Therapeutic activity, Neuromuscular re-education, Balance training, Gait training, Patient/Family education, Self Care, Joint mobilization, Stair training, DME instructions, Aquatic Therapy, Dry Needling, Spinal mobilization, Cryotherapy, Moist heat, and Manual therapy.  PLAN FOR NEXT SESSION: Progress core/hip strengthening exercises, LE/trunk stretching.   Cammie Mcgee, PT, DPT # [phone] Physical Therapist - [facility] 09/25/23, 1:28 PM

## 2023-09-27 ENCOUNTER — Telehealth: Payer: Self-pay | Admitting: Internal Medicine

## 2023-09-27 DIAGNOSIS — E119 Type 2 diabetes mellitus without complications: Secondary | ICD-10-CM

## 2023-09-27 DIAGNOSIS — E7800 Pure hypercholesterolemia, unspecified: Secondary | ICD-10-CM

## 2023-09-27 NOTE — Telephone Encounter (Signed)
Patient need lab orders.

## 2023-09-27 NOTE — Telephone Encounter (Signed)
Future labs ordered.  

## 2023-09-30 ENCOUNTER — Ambulatory Visit: Payer: Medicare HMO | Admitting: Physical Therapy

## 2023-10-02 ENCOUNTER — Other Ambulatory Visit: Payer: Medicare HMO

## 2023-10-02 ENCOUNTER — Ambulatory Visit: Payer: Medicare HMO | Admitting: Physical Therapy

## 2023-10-02 ENCOUNTER — Encounter: Payer: Self-pay | Admitting: Physical Therapy

## 2023-10-02 DIAGNOSIS — E119 Type 2 diabetes mellitus without complications: Secondary | ICD-10-CM

## 2023-10-02 DIAGNOSIS — M6281 Muscle weakness (generalized): Secondary | ICD-10-CM

## 2023-10-02 DIAGNOSIS — M5459 Other low back pain: Secondary | ICD-10-CM | POA: Diagnosis not present

## 2023-10-02 DIAGNOSIS — R262 Difficulty in walking, not elsewhere classified: Secondary | ICD-10-CM | POA: Diagnosis not present

## 2023-10-02 DIAGNOSIS — E7800 Pure hypercholesterolemia, unspecified: Secondary | ICD-10-CM | POA: Diagnosis not present

## 2023-10-02 LAB — HEPATIC FUNCTION PANEL
ALT: 13 U/L (ref 0–35)
AST: 18 U/L (ref 0–37)
Albumin: 4.2 g/dL (ref 3.5–5.2)
Alkaline Phosphatase: 65 U/L (ref 39–117)
Bilirubin, Direct: 0.1 mg/dL (ref 0.0–0.3)
Total Bilirubin: 0.7 mg/dL (ref 0.2–1.2)
Total Protein: 6.8 g/dL (ref 6.0–8.3)

## 2023-10-02 LAB — BASIC METABOLIC PANEL WITH GFR
BUN: 19 mg/dL (ref 6–23)
CO2: 27 meq/L (ref 19–32)
Calcium: 9.9 mg/dL (ref 8.4–10.5)
Chloride: 106 meq/L (ref 96–112)
Creatinine, Ser: 0.78 mg/dL (ref 0.40–1.20)
GFR: 72.76 mL/min
Glucose, Bld: 100 mg/dL — ABNORMAL HIGH (ref 70–99)
Potassium: 3.9 meq/L (ref 3.5–5.1)
Sodium: 142 meq/L (ref 135–145)

## 2023-10-02 LAB — LIPID PANEL
Cholesterol: 138 mg/dL (ref 0–200)
HDL: 64.9 mg/dL
LDL Cholesterol: 54 mg/dL (ref 0–99)
NonHDL: 73.42
Total CHOL/HDL Ratio: 2
Triglycerides: 98 mg/dL (ref 0.0–149.0)
VLDL: 19.6 mg/dL (ref 0.0–40.0)

## 2023-10-02 LAB — HEMOGLOBIN A1C: Hgb A1c MFr Bld: 5.7 % (ref 4.6–6.5)

## 2023-10-02 NOTE — Therapy (Signed)
OUTPATIENT PHYSICAL THERAPY THORACOLUMBAR/LOWER EXTREMITY TREATMENT  Patient Name: Erin Camacho MRN: [id number] DOB:August 07, 1946, 77 y.o., female Today's Date: 10/02/2023  END OF SESSION:  PT End of Session - 10/02/23 0948     Visit Number 18    Number of Visits 27    Date for PT Re-Evaluation 10/30/23    PT Start Time 0948    PT Stop Time 1036    PT Time Calculation (min) 48 min    Activity Tolerance Patient tolerated treatment well    Behavior During Therapy [facility] for tasks assessed/performed             Past Medical History:  Diagnosis Date   Allergy    hay fever   Anxiety    Arthritis    Chronic back pain    Diabetes mellitus    Dyspnea    a. 07/2017 Echo: EF 60-65%. No rwma. Mildly dil LA. Nl RV fxn.   GERD (gastroesophageal reflux disease)    History of hiatal hernia    Hyperlipidemia    Hypertension    Osteoarthritis    Pneumonia    Spinal stenosis    Type 2 diabetes mellitus (HCC)    UTI (lower urinary tract infection)    Past Surgical History:  Procedure Laterality Date   ABDOMINAL HYSTERECTOMY  1973   ABDOMINAL HYSTERECTOMY     APPENDECTOMY     APPLICATION OF ROBOTIC ASSISTANCE FOR SPINAL PROCEDURE N/A 09/15/2018   Procedure: APPLICATION OF ROBOTIC ASSISTANCE FOR SPINAL PROCEDURE;  Surgeon: Barnett Abu, MD;  Location: [facility] OR;  Service: Neurosurgery;  Laterality: N/A;   BACK SURGERY  04/17/13, 2017, 2019   CATARACT EXTRACTION W/PHACO Left 12/16/2019   Procedure: CATARACT EXTRACTION PHACO AND INTRAOCULAR LENS PLACEMENT (IOC) LEFT PANOPTIX LENS DIABETIC 8.07 01:19.2 10.2%;  Surgeon: Lockie Mola, MD;  Location: [facility];  Service: Ophthalmology;  Laterality: Left;  Diabetic - oral meds   CATARACT EXTRACTION W/PHACO Right 01/06/2020   Procedure: CATARACT EXTRACTION PHACO AND INTRAOCULAR LENS PLACEMENT (IOC) PANOPTIX LENS RIGHT DIABETIC 10.73 01:08.6 15.7%;  Surgeon: Lockie Mola, MD;  Location: [facility];  Service:  Ophthalmology;  Laterality: Right;  diabetic   CHOLECYSTECTOMY  1995   COLONOSCOPY WITH PROPOFOL N/A 03/15/2021   Procedure: COLONOSCOPY WITH PROPOFOL;  Surgeon: Earline Mayotte, MD;  Location: [facility];  Service: Endoscopy;  Laterality: N/A;   ESOPHAGOGASTRODUODENOSCOPY N/A 06/20/2022   Procedure: ESOPHAGOGASTRODUODENOSCOPY (EGD);  Surgeon: Toledo, Boykin Nearing, MD;  Location: [facility];  Service: Gastroenterology;  Laterality: N/A;   EYE SURGERY  2021   bilateral cataract removal with lens implants   HERNIA REPAIR  08/2006   lap band  08/2003   LUMBAR LAMINECTOMY/ DECOMPRESSION WITH MET-RX Left 12/29/2020   Procedure: Left Thoracic Seven-Eight Microdiscectomy;  Surgeon: Barnett Abu, MD;  Location: [facility] OR;  Service: Neurosurgery;  Laterality: Left;   SEPTOPLASTY  04/1978   TONSILLECTOMY AND ADENOIDECTOMY  1979   TOTAL HIP ARTHROPLASTY  02/2012   Right, Dr. Ernest Pine   TOTAL HIP ARTHROPLASTY Left 08/31/2019   Procedure: TOTAL HIP ARTHROPLASTY;  Surgeon: Donato Heinz, MD;  Location: [facility] ORS;  Service: Orthopedics;  Laterality: Left;   UPPER GI ENDOSCOPY  06/20/2022   Patient Active Problem List   Diagnosis Date Noted   Trigger finger 07/03/2023   Leg pain 11/29/2022   Right leg numbness 08/20/2022   Memory change 08/17/2022   Bilateral carotid artery stenosis 01/31/2022   Balance problem 01/31/2022   History of COVID-19 11/25/2021  Fall 05/22/2021   Herniated nucleus pulposus with myelopathy, thoracic 12/29/2020   Low back pain 09/15/2020   Opioid use (10 MME/day) 05/16/2020   Hypokalemia 04/28/2020   Chronic pain syndrome 04/20/2020   Disorder of skeletal system 04/20/2020   Chronic thoracic spine pain (Bilateral) (R>L) 04/20/2020   Myofascial pain syndrome of thoracic spine 04/20/2020   Chronic musculoskeletal pain 04/20/2020   Spasm of thoracic back muscle 04/20/2020   Weight loss 12/31/2019   History of total hip replacement (Left) 08/31/2019   Early satiety  07/05/2019   Lumbar stenosis with neurogenic claudication 09/15/2018   Shortness of breath 06/19/2018   Cough 04/13/2018   Aortic atherosclerosis (HCC) 01/11/2018   GERD (gastroesophageal reflux disease) 12/23/2016   Atrophic vaginitis 03/18/2016   Spondylolisthesis at L5-S1 level 12/06/2015   Rhinitis, allergic 11/22/2015   Right leg pain 07/27/2014   Neuritis or radiculitis due to rupture of lumbar intervertebral disc 06/28/2014   Chronic shoulder pain (Bilateral) 02/24/2014   History of bariatric surgery 01/18/2014   Status post bariatric surgery 01/18/2014   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   Depression, major, single episode, mild (HCC) 05/14/2013   Major depressive disorder, single episode 05/14/2013   Healthcare maintenance 01/07/2013   Hypercholesterolemia 08/22/2012   Hyperlipidemia 08/22/2012   Hypertension 07/07/2012   Diabetes mellitus type 2, diet-controlled (HCC) 07/07/2012   Insomnia 07/07/2012   Obesity (BMI 30-39.9) 07/07/2012   Essential (primary) hypertension 07/07/2012   Chronic low back pain (1ry area of Pain) (Bilateral) (R>L) w/o sciatica 10/18/2011    PCP: Dale Jericho, MD  REFERRING PROVIDER: Dale Lake Annette, MD  REFERRING DIAG: M79.604,M79.605 (ICD-10-CM) - Pain in both lower extremities  Rationale for Evaluation and Treatment: Rehabilitation  THERAPY DIAG:  Difficulty in walking, not elsewhere classified  Other low back pain  Muscle weakness (generalized)  ONSET DATE: after 2013 (after R hip replacement)  SUBJECTIVE:                                                                                                                                                                                           SUBJECTIVE STATEMENT: Patient reports BLE weakness and numbness. Numbness is constant on anterolateral thighs but gets worse with standing >10 minutes. Patient unable to ambulate her block around her home. Difficulty with stairs  (step to pattern). Patient has had 4 back surgeries - L1-S1 fusion and "rods" T8/9-L1 starting in 2014. Has had L and R hip replacements.   PERTINENT HISTORY:  Patient has been seeing Dr. Danielle Dess for her back with radicular pain. Has had a fall 4-5 months ago and hurt her L knee. Per Dr.  Scott's note from 07/05/23, patient reports weakness in upper thighs and limited in walking due to her legs. Dr. Lorin Picket discussed with patient about utilizing a walker to help with patient reporting she does better in stores with a cart she can lean on.   PAIN:  Are you having pain? Yes: NPRS scale: 4-5/10 at worst  Pain location: B legs  Pain description: restless Aggravating factors: laying down, standing for prolonged periods of time  Relieving factors: movement, medication   PRECAUTIONS: Fall  WEIGHT BEARING RESTRICTIONS: No  FALLS:  Has patient fallen in last 6 months? Yes. Number of falls 1  LIVING ENVIRONMENT:   Lives with: lives with their spouse Lives in: House/apartment Stairs: No Has following equipment at home: Single point cane and Environmental consultant - 4 wheeled  OCCUPATION: retired   PLOF: Independent  PATIENT GOALS: to be able to walk a distance and not hurt   NEXT MD VISIT: 09/2023 (PCP)  OBJECTIVE:   DIAGNOSTIC FINDINGS:  N/A - unable to obtain due to outside provider   PATIENT SURVEYS:  FOTO 31 with goal of 48  SCREENING FOR RED FLAGS: Bowel or bladder incontinence: Yes: at times, patient reports unaware of bowel movement until trip to the restroom.  No issues with bladder incontinence.  Spinal tumors: No Cauda equina syndrome: No Compression fracture: No Abdominal aneurysm: No  COGNITION: Overall cognitive status: Within functional limits for tasks assessed     SENSATION: Sensory testing WFL, however patient reports numbness on anterolateral parts of B thighs  POSTURE: rounded shoulders, forward head, and increased thoracic kyphosis  PALPATION: Nontender to lumbar and  thoracic paraspinals  Notable piriformis tightness bilaterally with FADIR  LUMBAR ROM:   AROM eval  Flexion 80%  Extension 50%  Right lateral flexion To mid thigh*  Left lateral flexion To mid thigh*  Right rotation 50% *  Left rotation 50% *   (Blank rows = not tested)   *=pain   LOWER EXTREMITY MMT:    MMT Right eval Left eval Right  09/18/23 Left  09/18/23  Hip flexion 4- 4- 4 4  Hip extension      Hip abduction 4- 4- 4 4  Hip adduction 4- 4- 4 4  Hip internal rotation      Hip external rotation      Knee flexion 4 4 4+ 4+  Knee extension 4 4 4+ 4+  Ankle dorsiflexion 4- 4- 4 4   (Blank rows = not tested)  LUMBAR SPECIAL TESTS:  Straight leg raise test: Negative and FABER test: Positive; FADIR; positive on B LE   FUNCTIONAL TESTS:  5 times sit to stand: 42.11 sec 6 minute walk test: NT on eval Dynamic Gait Index: NT on eval   GAIT: Distance walked: 66' Assistive device utilized: None Level of assistance: Modified independence Comments: decreased stance time bilaterally, mild L/R lateral lean    09/18/23:  MMT BLE - see chart above 5xSTS: 18.78 seconds : 1,015 feet with no AD and no rest break  DGI: 17/24  FOTO: 46   TODAY'S TREATMENT:  DATE: 10/02/23   Subjective: Pt. States she has been really busy past several days because of house floor remediations.  Pt. Staying at hotel currently with husband.      There.ex.:  Walking in hallway with consistent recip. Gait pattern and cuing for posture/ arm swing.  3 laps.    Nustep L5 10 min. B UE/LE (consistent cadence)- 0.31 miles.    Resisted gait 2BTB: 5x all 4-planes with light to no UE assist.  Cuing to correct upright posture.  Mirror feedback for posture correction.    Neuro.mm.:  Walking in hallway with consistent recip. Pattern and arm swing.  Added alt. UE and LE  touches (marked fatigue after 1 lap in hallway).  Short seated rest break prior to Blaze pods.    Blaze pods: random/ focus (2 reps each for 1 minute).    Step ups (recip. Pattern) at stairs without UE assist on handrails.  3x with SBA for safety.       Discussed HEP    PATIENT EDUCATION:  Education details: HEP, POC, goals Person educated: Patient Education method: Explanation, Demonstration, and Handouts Education comprehension: verbalized understanding and returned demonstration  HOME EXERCISE PROGRAM: Access Code: [email or web address] URL: [email or web address] Date: 07/18/2023 Prepared by: Maylon Peppers  Exercises - Standing Hip Abduction with Counter Support  - 2-3 x daily - 5-7 x weekly - 3 sets - 10 reps - Standing March with Counter Support  - 2-3 x daily - 5-7 x weekly - 3 sets - 10 reps - Heel Raises with Counter Support  - 2-3 x daily - 5-7 x weekly - 3 sets - 10 reps - Sit to Stand with Counter Support  - 2-3 x daily - 5-7 x weekly - 3 sets - 10 reps - Seated Hip Adduction Isometrics with Ball  - 2-3 x daily - 5-7 x weekly - 3 sets - 10 reps - 5 second hold - Supine Bridge  - 2-3 x daily - 5-7 x weekly - 3 sets - 10 reps - Dead Bug  - 2-3 x daily - 5-7 x weekly - 3 sets - 10 reps - Supine Active Straight Leg Raise  - 2-3 x daily - 5-7 x weekly - 3 sets - 10 reps  ASSESSMENT:  CLINICAL IMPRESSION: Patient works hard with skilled PT services to increase B LE strength to improve balance/ gait.  Moderate fatigue noted during hallway tasks/ balance.  No LOB and pt. Ambulates with more consistent heel strike.  Patient's condition has the potential to improve in response to therapy. Maximum improvement is yet to be obtained. The anticipated improvement is attainable and reasonable in a generally predictable time. Patient will benefit from skilled PT interventions to address listed impairments to improve overall mobility and reduce pain during ADLs and walking.   OBJECTIVE  IMPAIRMENTS: Abnormal gait, cardiopulmonary status limiting activity, decreased activity tolerance, decreased balance, decreased endurance, decreased mobility, difficulty walking, decreased strength, improper body mechanics, postural dysfunction, and pain.   ACTIVITY LIMITATIONS: bending, standing, squatting, sleeping, stairs, transfers, and locomotion level  PARTICIPATION LIMITATIONS: cleaning, laundry, shopping, and community activity  PERSONAL FACTORS: Age, Behavior pattern, Fitness, and Past/current experiences are also affecting patient's functional outcome.   REHAB POTENTIAL: Good  CLINICAL DECISION MAKING: Stable/uncomplicated  EVALUATION COMPLEXITY: Low   GOALS: Goals reviewed with patient? Yes  LONG TERM GOALS: Target date: 10/30/2023  Patient will increase FOTO score to equal to or greater than 48 to demonstrate statistically significant improvement in mobility and quality of life.  Baseline: 8/29: 31; 10/30: 46 Goal status: Partially met  2.  Patient (> 63 years old) will complete five times sit to stand test in < 15 seconds indicating an increased LE strength and improved balance. Baseline: 8/29: 42.11 seconds; 10/9: 34.5 seconds; 10/30: 18.78 seconds Goal status: Partially met  3.  Patient will increase six minute walk test distance by 200' for progression to community ambulator and improve gait ability. Baseline: 8/29: to be tested on 2nd visit; 10/9: 639' with rest break; 10/30: 1015' with no rest and no AD  Goal status: Partially met  4.  Patient will increase BLE gross strength to 4+/5 as to improve functional strength for independent gait, increased standing tolerance and increased ADL ability. Baseline: 8/29: see above; 10/30: see above  Goal status: Partially met  5.  Patient will increase Dynamic Gait Index (DGI) score to >19/24 as to demonstrate reduced fall risk and improved dynamic gait balance for better safety with community/home ambulation.  Baseline:  8/29: to be tested on 2nd visit 10/30: 17/24 Goal status: Partially met   PLAN:  PT FREQUENCY: 1-2x/week  PT DURATION: 6 weeks  PLANNED INTERVENTIONS: Therapeutic exercises, Therapeutic activity, Neuromuscular re-education, Balance training, Gait training, Patient/Family education, Self Care, Joint mobilization, Stair training, DME instructions, Aquatic Therapy, Dry Needling, Spinal mobilization, Cryotherapy, Moist heat, and Manual therapy.  PLAN FOR NEXT SESSION: Progress core/hip strengthening exercises, LE/trunk stretching.   Cammie Mcgee, PT, DPT # [phone] Physical Therapist - [facility] 10/02/23, 1:11 PM

## 2023-10-03 DIAGNOSIS — M65342 Trigger finger, left ring finger: Secondary | ICD-10-CM | POA: Diagnosis not present

## 2023-10-07 ENCOUNTER — Encounter: Payer: Self-pay | Admitting: Internal Medicine

## 2023-10-07 ENCOUNTER — Ambulatory Visit (INDEPENDENT_AMBULATORY_CARE_PROVIDER_SITE_OTHER): Payer: Medicare HMO | Admitting: Internal Medicine

## 2023-10-07 ENCOUNTER — Ambulatory Visit: Payer: Medicare HMO | Admitting: Physical Therapy

## 2023-10-07 ENCOUNTER — Encounter: Payer: Self-pay | Admitting: Physical Therapy

## 2023-10-07 VITALS — BP 118/70 | HR 76 | Temp 98.0°F | Resp 16 | Ht 65.0 in | Wt 208.0 lb

## 2023-10-07 DIAGNOSIS — M48062 Spinal stenosis, lumbar region with neurogenic claudication: Secondary | ICD-10-CM | POA: Diagnosis not present

## 2023-10-07 DIAGNOSIS — R262 Difficulty in walking, not elsewhere classified: Secondary | ICD-10-CM

## 2023-10-07 DIAGNOSIS — Z7985 Long-term (current) use of injectable non-insulin antidiabetic drugs: Secondary | ICD-10-CM

## 2023-10-07 DIAGNOSIS — M5459 Other low back pain: Secondary | ICD-10-CM

## 2023-10-07 DIAGNOSIS — E119 Type 2 diabetes mellitus without complications: Secondary | ICD-10-CM

## 2023-10-07 DIAGNOSIS — K219 Gastro-esophageal reflux disease without esophagitis: Secondary | ICD-10-CM

## 2023-10-07 DIAGNOSIS — M6281 Muscle weakness (generalized): Secondary | ICD-10-CM

## 2023-10-07 DIAGNOSIS — I10 Essential (primary) hypertension: Secondary | ICD-10-CM

## 2023-10-07 DIAGNOSIS — M65342 Trigger finger, left ring finger: Secondary | ICD-10-CM | POA: Diagnosis not present

## 2023-10-07 DIAGNOSIS — E7800 Pure hypercholesterolemia, unspecified: Secondary | ICD-10-CM

## 2023-10-07 DIAGNOSIS — Z9884 Bariatric surgery status: Secondary | ICD-10-CM

## 2023-10-07 DIAGNOSIS — I700 Atherosclerosis of aorta: Secondary | ICD-10-CM | POA: Diagnosis not present

## 2023-10-07 MED ORDER — SEMAGLUTIDE (1 MG/DOSE) 4 MG/3ML ~~LOC~~ SOPN: 1.0000 mg | PEN_INJECTOR | SUBCUTANEOUS | 3 refills | Status: DC

## 2023-10-07 NOTE — Therapy (Signed)
OUTPATIENT PHYSICAL THERAPY THORACOLUMBAR/LOWER EXTREMITY TREATMENT  Patient Name: Erin Camacho MRN: [id number] DOB:Mar 28, 1946, 77 y.o., female Today's Date: 10/07/2023  END OF SESSION:  PT End of Session - 10/07/23 0851     Visit Number 19    Number of Visits 27    Date for PT Re-Evaluation 10/30/23    PT Start Time 0852    PT Stop Time 0949    PT Time Calculation (min) 57 min             Past Medical History:  Diagnosis Date   Allergy    hay fever   Anxiety    Arthritis    Chronic back pain    Diabetes mellitus    Dyspnea    a. 07/2017 Echo: EF 60-65%. No rwma. Mildly dil LA. Nl RV fxn.   GERD (gastroesophageal reflux disease)    History of hiatal hernia    Hyperlipidemia    Hypertension    Osteoarthritis    Pneumonia    Spinal stenosis    Type 2 diabetes mellitus (HCC)    UTI (lower urinary tract infection)    Past Surgical History:  Procedure Laterality Date   ABDOMINAL HYSTERECTOMY  1973   ABDOMINAL HYSTERECTOMY     APPENDECTOMY     APPLICATION OF ROBOTIC ASSISTANCE FOR SPINAL PROCEDURE N/A 09/15/2018   Procedure: APPLICATION OF ROBOTIC ASSISTANCE FOR SPINAL PROCEDURE;  Surgeon: Barnett Abu, MD;  Location: [facility] OR;  Service: Neurosurgery;  Laterality: N/A;   BACK SURGERY  04/17/13, 2017, 2019   CATARACT EXTRACTION W/PHACO Left 12/16/2019   Procedure: CATARACT EXTRACTION PHACO AND INTRAOCULAR LENS PLACEMENT (IOC) LEFT PANOPTIX LENS DIABETIC 8.07 01:19.2 10.2%;  Surgeon: Lockie Mola, MD;  Location: [facility];  Service: Ophthalmology;  Laterality: Left;  Diabetic - oral meds   CATARACT EXTRACTION W/PHACO Right 01/06/2020   Procedure: CATARACT EXTRACTION PHACO AND INTRAOCULAR LENS PLACEMENT (IOC) PANOPTIX LENS RIGHT DIABETIC 10.73 01:08.6 15.7%;  Surgeon: Lockie Mola, MD;  Location: [facility];  Service: Ophthalmology;  Laterality: Right;  diabetic   CHOLECYSTECTOMY  1995   COLONOSCOPY WITH PROPOFOL N/A 03/15/2021    Procedure: COLONOSCOPY WITH PROPOFOL;  Surgeon: Earline Mayotte, MD;  Location: [facility];  Service: Endoscopy;  Laterality: N/A;   ESOPHAGOGASTRODUODENOSCOPY N/A 06/20/2022   Procedure: ESOPHAGOGASTRODUODENOSCOPY (EGD);  Surgeon: Toledo, Boykin Nearing, MD;  Location: [facility];  Service: Gastroenterology;  Laterality: N/A;   EYE SURGERY  2021   bilateral cataract removal with lens implants   HERNIA REPAIR  08/2006   lap band  08/2003   LUMBAR LAMINECTOMY/ DECOMPRESSION WITH MET-RX Left 12/29/2020   Procedure: Left Thoracic Seven-Eight Microdiscectomy;  Surgeon: Barnett Abu, MD;  Location: [facility] OR;  Service: Neurosurgery;  Laterality: Left;   SEPTOPLASTY  04/1978   TONSILLECTOMY AND ADENOIDECTOMY  1979   TOTAL HIP ARTHROPLASTY  02/2012   Right, Dr. Ernest Pine   TOTAL HIP ARTHROPLASTY Left 08/31/2019   Procedure: TOTAL HIP ARTHROPLASTY;  Surgeon: Donato Heinz, MD;  Location: [facility];  Service: Orthopedics;  Laterality: Left;   UPPER GI ENDOSCOPY  06/20/2022   Patient Active Problem List   Diagnosis Date Noted   Trigger finger 07/03/2023   Leg pain 11/29/2022   Right leg numbness 08/20/2022   Memory change 08/17/2022   Bilateral carotid artery stenosis 01/31/2022   Balance problem 01/31/2022   History of COVID-19 11/25/2021   Fall 05/22/2021   Herniated nucleus pulposus with myelopathy, thoracic 12/29/2020   Low back pain 09/15/2020  Opioid use (10 MME/day) 05/16/2020   Hypokalemia 04/28/2020   Chronic pain syndrome 04/20/2020   Disorder of skeletal system 04/20/2020   Chronic thoracic spine pain (Bilateral) (R>L) 04/20/2020   Myofascial pain syndrome of thoracic spine 04/20/2020   Chronic musculoskeletal pain 04/20/2020   Spasm of thoracic back muscle 04/20/2020   Weight loss 12/31/2019   History of total hip replacement (Left) 08/31/2019   Early satiety 07/05/2019   Lumbar stenosis with neurogenic claudication 09/15/2018   Shortness of breath 06/19/2018   Cough  04/13/2018   Aortic atherosclerosis (HCC) 01/11/2018   GERD (gastroesophageal reflux disease) 12/23/2016   Atrophic vaginitis 03/18/2016   Spondylolisthesis at L5-S1 level 12/06/2015   Rhinitis, allergic 11/22/2015   Right leg pain 07/27/2014   Neuritis or radiculitis due to rupture of lumbar intervertebral disc 06/28/2014   Chronic shoulder pain (Bilateral) 02/24/2014   History of bariatric surgery 01/18/2014   Status post bariatric surgery 01/18/2014   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   Depression, major, single episode, mild (HCC) 05/14/2013   Major depressive disorder, single episode 05/14/2013   Healthcare maintenance 01/07/2013   Hypercholesterolemia 08/22/2012   Hyperlipidemia 08/22/2012   Hypertension 07/07/2012   Diabetes mellitus type 2, diet-controlled (HCC) 07/07/2012   Insomnia 07/07/2012   Obesity (BMI 30-39.9) 07/07/2012   Essential (primary) hypertension 07/07/2012   Chronic low back pain (1ry area of Pain) (Bilateral) (R>L) w/o sciatica 10/18/2011    PCP: Dale Crossett, MD  REFERRING PROVIDER: Dale [place], MD  REFERRING DIAG: M79.604,M79.605 (ICD-10-CM) - Pain in both lower extremities  Rationale for Evaluation and Treatment: Rehabilitation  THERAPY DIAG:  Difficulty in walking, not elsewhere classified  Other low back pain  Muscle weakness (generalized)  ONSET DATE: after 2013 (after R hip replacement)  SUBJECTIVE:                                                                                                                                                                                           SUBJECTIVE STATEMENT: Patient reports BLE weakness and numbness. Numbness is constant on anterolateral thighs but gets worse with standing >10 minutes. Patient unable to ambulate her block around her home. Difficulty with stairs (step to pattern). Patient has had 4 back surgeries - L1-S1 fusion and "rods" T8/9-L1 starting in 2014. Has had L  and R hip replacements.   PERTINENT HISTORY:  Patient has been seeing Dr. Danielle Dess for her back with radicular pain. Has had a fall 4-5 months ago and hurt her L knee. Per Dr. Roby Lofts note from 07/05/23, patient reports weakness in upper thighs and limited in walking due to her legs. Dr.  Scott discussed with patient about utilizing a walker to help with patient reporting she does better in stores with a cart she can lean on.   PAIN:  Are you having pain? Yes: NPRS scale: 4-5/10 at worst  Pain location: B legs  Pain description: restless Aggravating factors: laying down, standing for prolonged periods of time  Relieving factors: movement, medication   PRECAUTIONS: Fall  WEIGHT BEARING RESTRICTIONS: No  FALLS:  Has patient fallen in last 6 months? Yes. Number of falls 1  LIVING ENVIRONMENT:   Lives with: lives with their spouse Lives in: House/apartment Stairs: No Has following equipment at home: Single point cane and Environmental consultant - 4 wheeled  OCCUPATION: retired   PLOF: Independent  PATIENT GOALS: to be able to walk a distance and not hurt   NEXT MD VISIT: 09/2023 (PCP)  OBJECTIVE:   DIAGNOSTIC FINDINGS:  N/A - unable to obtain due to outside provider   PATIENT SURVEYS:  FOTO 31 with goal of 48  SCREENING FOR RED FLAGS: Bowel or bladder incontinence: Yes: at times, patient reports unaware of bowel movement until trip to the restroom.  No issues with bladder incontinence.  Spinal tumors: No Cauda equina syndrome: No Compression fracture: No Abdominal aneurysm: No  COGNITION: Overall cognitive status: Within functional limits for tasks assessed     SENSATION: Sensory testing WFL, however patient reports numbness on anterolateral parts of B thighs  POSTURE: rounded shoulders, forward head, and increased thoracic kyphosis  PALPATION: Nontender to lumbar and thoracic paraspinals  Notable piriformis tightness bilaterally with FADIR  LUMBAR ROM:   AROM eval  Flexion 80%   Extension 50%  Right lateral flexion To mid thigh*  Left lateral flexion To mid thigh*  Right rotation 50% *  Left rotation 50% *   (Blank rows = not tested)   *=pain   LOWER EXTREMITY MMT:    MMT Right eval Left eval Right  09/18/23 Left  09/18/23  Hip flexion 4- 4- 4 4  Hip extension      Hip abduction 4- 4- 4 4  Hip adduction 4- 4- 4 4  Hip internal rotation      Hip external rotation      Knee flexion 4 4 4+ 4+  Knee extension 4 4 4+ 4+  Ankle dorsiflexion 4- 4- 4 4   (Blank rows = not tested)  LUMBAR SPECIAL TESTS:  Straight leg raise test: Negative and FABER test: Positive; FADIR; positive on B LE   FUNCTIONAL TESTS:  5 times sit to stand: 42.11 sec 6 minute walk test: NT on eval Dynamic Gait Index: NT on eval   GAIT: Distance walked: 23' Assistive device utilized: None Level of assistance: Modified independence Comments: decreased stance time bilaterally, mild L/R lateral lean    09/18/23:  MMT BLE - see chart above 5xSTS: 18.78 seconds : 1,015 feet with no AD and no rest break  DGI: 17/24  FOTO: 46   TODAY'S TREATMENT:  DATE: 10/07/23   Subjective: Pt. Returned home from hotel this past weekend.  Pt. Reports no new complaints and has been really active with household activities since new floors are completed.  No c/o pain at this time.     There.ex.:  Walking in hallway with consistent recip. Gait pattern and cuing for posture/ arm swing.  3 laps.    STS from gray chair 7x with no UE assist.  Pt. Required extra time/ UE assist on arm rest during 8th rep.    Nustep L5 10 min. B UE/LE (consistent cadence)- 0.31 miles.    Resisted gait 2BTB: 5x all 4-planes with light to no UE assist.  Cuing to correct upright posture.  Mirror feedback for posture correction.    TRX squats 10x2 with good technique.  Walking around PT  clinic 2 laps prior to seated rest break.    Discussed HEP   Not Today:  Walking in //-bars with alt. UE and LE touches (marked fatigue after 1 lap in hallway).  Short seated rest break prior to Blaze pods.   Blaze pods: random/ focus (2 reps each for 1 minute).   Step ups (recip. Pattern) at stairs without UE assist on handrails.  3x with SBA for safety.       PATIENT EDUCATION:  Education details: HEP, POC, goals Person educated: Patient Education method: Explanation, Demonstration, and Handouts Education comprehension: verbalized understanding and returned demonstration  HOME EXERCISE PROGRAM: Access Code: [email or web address] URL: [email or web address] Date: 07/18/2023 Prepared by: Maylon Peppers  Exercises - Standing Hip Abduction with Counter Support  - 2-3 x daily - 5-7 x weekly - 3 sets - 10 reps - Standing March with Counter Support  - 2-3 x daily - 5-7 x weekly - 3 sets - 10 reps - Heel Raises with Counter Support  - 2-3 x daily - 5-7 x weekly - 3 sets - 10 reps - Sit to Stand with Counter Support  - 2-3 x daily - 5-7 x weekly - 3 sets - 10 reps - Seated Hip Adduction Isometrics with Ball  - 2-3 x daily - 5-7 x weekly - 3 sets - 10 reps - 5 second hold - Supine Bridge  - 2-3 x daily - 5-7 x weekly - 3 sets - 10 reps - Dead Bug  - 2-3 x daily - 5-7 x weekly - 3 sets - 10 reps - Supine Active Straight Leg Raise  - 2-3 x daily - 5-7 x weekly - 3 sets - 10 reps  ASSESSMENT:  CLINICAL IMPRESSION: Patient works hard with skilled PT services to increase B LE strength to improve balance/ gait.  Moderate fatigue noted during hallway tasks/ balance.  No LOB and pt. Ambulates with more consistent heel strike.  Patient's condition has the potential to improve in response to therapy. Maximum improvement is yet to be obtained. The anticipated improvement is attainable and reasonable in a generally predictable time. Patient will benefit from skilled PT interventions to address listed  impairments to improve overall mobility and reduce pain during ADLs and walking.   OBJECTIVE IMPAIRMENTS: Abnormal gait, cardiopulmonary status limiting activity, decreased activity tolerance, decreased balance, decreased endurance, decreased mobility, difficulty walking, decreased strength, improper body mechanics, postural dysfunction, and pain.   ACTIVITY LIMITATIONS: bending, standing, squatting, sleeping, stairs, transfers, and locomotion level  PARTICIPATION LIMITATIONS: cleaning, laundry, shopping, and community activity  PERSONAL FACTORS: Age, Behavior pattern, Fitness, and Past/current experiences are also affecting patient's functional outcome.   REHAB POTENTIAL: Good  CLINICAL  DECISION MAKING: Stable/uncomplicated  EVALUATION COMPLEXITY: Low   GOALS: Goals reviewed with patient? Yes  LONG TERM GOALS: Target date: 10/30/2023  Patient will increase FOTO score to equal to or greater than 48 to demonstrate statistically significant improvement in mobility and quality of life.  Baseline: 8/29: 31; 10/30: 46 Goal status: Partially met  2.  Patient (> 30 years old) will complete five times sit to stand test in < 15 seconds indicating an increased LE strength and improved balance. Baseline: 8/29: 42.11 seconds; 10/9: 34.5 seconds; 10/30: 18.78 seconds Goal status: Partially met  3.  Patient will increase six minute walk test distance by 200' for progression to community ambulator and improve gait ability. Baseline: 8/29: to be tested on 2nd visit; 10/9: 639' with rest break; 10/30: 1015' with no rest and no AD  Goal status: Partially met  4.  Patient will increase BLE gross strength to 4+/5 as to improve functional strength for independent gait, increased standing tolerance and increased ADL ability. Baseline: 8/29: see above; 10/30: see above  Goal status: Partially met  5.  Patient will increase Dynamic Gait Index (DGI) score to >19/24 as to demonstrate reduced fall risk  and improved dynamic gait balance for better safety with community/home ambulation.  Baseline: 8/29: to be tested on 2nd visit 10/30: 17/24 Goal status: Partially met   PLAN:  PT FREQUENCY: 1-2x/week  PT DURATION: 6 weeks  PLANNED INTERVENTIONS: Therapeutic exercises, Therapeutic activity, Neuromuscular re-education, Balance training, Gait training, Patient/Family education, Self Care, Joint mobilization, Stair training, DME instructions, Aquatic Therapy, Dry Needling, Spinal mobilization, Cryotherapy, Moist heat, and Manual therapy.  PLAN FOR NEXT SESSION: Progress core/hip strengthening exercises, LE/trunk stretching.   Cammie Mcgee, PT, DPT # [phone] Physical Therapist - [facility] 10/07/23, 1:08 PM

## 2023-10-07 NOTE — Progress Notes (Signed)
Subjective:    Patient ID: Erin Camacho, female    DOB: [birth year], 77 y.o.   MRN: [id number]  Patient here for  Chief Complaint  Patient presents with   Medical Management of Chronic Issues    HPI Here for a scheduled follow up. Recently evaluated for dysphagia and reflux symptoms. History of lap gastric band placement. Saw bariatric surgery - recommended removing fluid from band. Saw GI 05/24/23.  Dysphagia secondary to tight LAGB. Resolved after fluid removed. GI had recommended starting ozempic for diabetes and weight loss. Started ozempic last visit. Tolerating. Saw Dr Sherryll Burger 09/17/23 - evaluation - bilateral lower extremity pain and numbness.  Recommended continuing PT. Also recommended aspirin 81mg  q day - mild to moderate white matter changes. Saw Dr Rosita Kea for trigger finger - left ring finger.  Has had two injections.  Planning for surgery/procedure. Off protonix.  No acid reflux.    Past Medical History:  Diagnosis Date   Allergy    hay fever   Anxiety    Arthritis    Chronic back pain    Diabetes mellitus    Dyspnea    a. 07/2017 Echo: EF 60-65%. No rwma. Mildly dil LA. Nl RV fxn.   GERD (gastroesophageal reflux disease)    History of hiatal hernia    Hyperlipidemia    Hypertension    Osteoarthritis    Pneumonia    Spinal stenosis    Type 2 diabetes mellitus (HCC)    UTI (lower urinary tract infection)    Past Surgical History:  Procedure Laterality Date   ABDOMINAL HYSTERECTOMY  1973   ABDOMINAL HYSTERECTOMY     APPENDECTOMY     APPLICATION OF ROBOTIC ASSISTANCE FOR SPINAL PROCEDURE N/A 09/15/2018   Procedure: APPLICATION OF ROBOTIC ASSISTANCE FOR SPINAL PROCEDURE;  Surgeon: Barnett Abu, MD;  Location: [facility];  Service: Neurosurgery;  Laterality: N/A;   BACK SURGERY  04/17/13, 2017, 2019   CATARACT EXTRACTION W/PHACO Left 12/16/2019   Procedure: CATARACT EXTRACTION PHACO AND INTRAOCULAR LENS PLACEMENT (IOC) LEFT PANOPTIX LENS DIABETIC 8.07 01:19.2 10.2%;  Surgeon:  Lockie Mola, MD;  Location: [facility];  Service: Ophthalmology;  Laterality: Left;  Diabetic - oral meds   CATARACT EXTRACTION W/PHACO Right 01/06/2020   Procedure: CATARACT EXTRACTION PHACO AND INTRAOCULAR LENS PLACEMENT (IOC) PANOPTIX LENS RIGHT DIABETIC 10.73 01:08.6 15.7%;  Surgeon: Lockie Mola, MD;  Location: [facility];  Service: Ophthalmology;  Laterality: Right;  diabetic   CHOLECYSTECTOMY  1995   COLONOSCOPY WITH PROPOFOL N/A 03/15/2021   Procedure: COLONOSCOPY WITH PROPOFOL;  Surgeon: Earline Mayotte, MD;  Location: [facility];  Service: Endoscopy;  Laterality: N/A;   ESOPHAGOGASTRODUODENOSCOPY N/A 06/20/2022   Procedure: ESOPHAGOGASTRODUODENOSCOPY (EGD);  Surgeon: Toledo, Boykin Nearing, MD;  Location: [facility];  Service: Gastroenterology;  Laterality: N/A;   EYE SURGERY  2021   bilateral cataract removal with lens implants   HERNIA REPAIR  08/2006   lap band  08/2003   LUMBAR LAMINECTOMY/ DECOMPRESSION WITH MET-RX Left 12/29/2020   Procedure: Left Thoracic Seven-Eight Microdiscectomy;  Surgeon: Barnett Abu, MD;  Location: [facility] OR;  Service: Neurosurgery;  Laterality: Left;   SEPTOPLASTY  04/1978   TONSILLECTOMY AND ADENOIDECTOMY  1979   TOTAL HIP ARTHROPLASTY  02/2012   Right, Dr. Ernest Pine   TOTAL HIP ARTHROPLASTY Left 08/31/2019   Procedure: TOTAL HIP ARTHROPLASTY;  Surgeon: Donato Heinz, MD;  Location: [facility];  Service: Orthopedics;  Laterality: Left;   UPPER GI ENDOSCOPY  06/20/2022  Family History  Problem Relation Age of Onset   Hypertension Mother    Stroke Mother    Heart disease Mother    Hypertension Father    Stroke Father    Heart disease Father    Arthritis Sister    Diabetes Sister    Cancer Sister    Diabetes Brother    Hypertension Brother    Dementia Brother    Diabetes Brother    Hypertension Brother    Dementia Brother    Breast cancer Other 83   Cancer Other        breast   Diabetes Other     Kidney disease Neg Hx    Bladder Cancer Neg Hx    Social History   Socioeconomic History   Marital status: Married    Spouse name: Not on file   Number of children: Not on file   Years of education: Not on file   Highest education level: 12th grade  Occupational History   Not on file  Tobacco Use   Smoking status: Never   Smokeless tobacco: Never  Vaping Use   Vaping status: Never Used  Substance and Sexual Activity   Alcohol use: Yes    Alcohol/week: 0.0 - 1.0 standard drinks of alcohol    Comment: RARELY   Drug use: No   Sexual activity: Not Currently  Other Topics Concern   Not on file  Social History Narrative   Lives in [place] with husband.      Work - retired Surveyor, minerals   Social Determinants of Corporate investment banker Strain: Low Risk  (03/28/2023)   Overall Financial Resource Strain (CARDIA)    Difficulty of Paying Living Expenses: Not hard at all  Food Insecurity: No Food Insecurity (03/28/2023)   Hunger Vital Sign    Worried About Running Out of Food in the Last Year: Never true    Ran Out of Food in the Last Year: Never true  Transportation Needs: No Transportation Needs (03/28/2023)   PRAPARE - Administrator, Civil Service (Medical): No    Lack of Transportation (Non-Medical): No  Physical Activity: Insufficiently Active (03/28/2023)   Exercise Vital Sign    Days of Exercise per Week: 1 day    Minutes of Exercise per Session: 10 min  Stress: Stress Concern Present (03/28/2023)   [employer] of Occupational Health - Occupational Stress Questionnaire    Feeling of Stress : Rather much  Social Connections: Socially Integrated (03/28/2023)   Social Connection and Isolation Panel [NHANES]    Frequency of Communication with Friends and Family: More than three times a week    Frequency of Social Gatherings with Friends and Family: Twice a week    Attends Religious Services: More than 4 times per year    Active Member of [employer] or  Organizations: Yes    Attends Engineer, structural: More than 4 times per year    Marital Status: Married     Review of Systems  Constitutional:  Negative for appetite change and unexpected weight change.  HENT:  Negative for congestion and sinus pressure.   Respiratory:  Negative for cough, chest tightness and shortness of breath.   Cardiovascular:  Negative for chest pain, palpitations and leg swelling.  Gastrointestinal:  Negative for abdominal pain, diarrhea, nausea and vomiting.  Genitourinary:  Negative for difficulty urinating and dysuria.  Musculoskeletal:  Negative for joint swelling and myalgias.       Bilateral lower  extremity pain and numbness as outlined.   Skin:  Negative for color change and rash.  Neurological:  Negative for dizziness and headaches.  Psychiatric/Behavioral:  Negative for agitation and dysphoric mood.        Objective:     BP 118/70   Pulse 76   Temp 98 F (36.7 C)   Resp 16   Ht 5\' 5"  (1.651 m)   Wt 208 lb (94.3 kg)   SpO2 98%   BMI 34.61 kg/m  Wt Readings from Last 3 Encounters:  10/07/23 208 lb (94.3 kg)  07/05/23 212 lb (96.2 kg)  04/01/23 204 lb (92.5 kg)    Physical Exam Vitals reviewed.  Constitutional:      General: She is not in acute distress.    Appearance: Normal appearance.  HENT:     Head: Normocephalic and atraumatic.     Right Ear: External ear normal.     Left Ear: External ear normal.  Eyes:     General: No scleral icterus.       Right eye: No discharge.        Left eye: No discharge.     Conjunctiva/sclera: Conjunctivae normal.  Neck:     Thyroid: No thyromegaly.  Cardiovascular:     Rate and Rhythm: Normal rate and regular rhythm.  Pulmonary:     Effort: No respiratory distress.     Breath sounds: Normal breath sounds. No wheezing.  Abdominal:     General: Bowel sounds are normal.     Palpations: Abdomen is soft.     Tenderness: There is no abdominal tenderness.  Musculoskeletal:         General: No swelling or tenderness.     Cervical back: Neck supple. No tenderness.  Lymphadenopathy:     Cervical: No cervical adenopathy.  Skin:    Findings: No erythema or rash.  Neurological:     Mental Status: She is alert.  Psychiatric:        Mood and Affect: Mood normal.        Behavior: Behavior normal.      Outpatient Encounter Medications as of 10/07/2023  Medication Sig   Semaglutide, 1 MG/DOSE, 4 MG/3ML SOPN Inject 1 mg as directed once a week.   cholecalciferol (VITAMIN D) 25 MCG (1000 UNIT) tablet Take 1,000 Units by mouth daily.   Ferrous Sulfate (IRON) 325 (65 Fe) MG TABS Take 325 mg by mouth daily.   fexofenadine (ALLEGRA) 180 MG tablet Take 1 tablet (180 mg total) by mouth daily.   fluticasone (FLONASE) 50 MCG/ACT nasal spray SPRAY 2 SPRAYS INTO EACH NOSTRIL EVERY DAY   gabapentin (NEURONTIN) 300 MG capsule Take 300 mg by mouth 2 (two) times daily.   lidocaine (LIDODERM) 5 % SMARTSIG:2 Patch(s) T-DERMAL Daily   losartan-hydrochlorothiazide (HYZAAR) 50-12.5 MG tablet Take 1 tablet by mouth daily.   metFORMIN (GLUCOPHAGE) 500 MG tablet TAKE 1 TABLET (500 MG TOTAL) BY MOUTH DAILY.   Multiple Vitamin (MULTIVITAMIN WITH MINERALS) TABS tablet Take 1 tablet by mouth daily.   OneTouch Delica Lancets 30G MISC Use to check blood sugar x per day. DX E11.9.   ONETOUCH VERIO test strip USE TO CHECK BLOOD SUGAR TWICE A DAY   sertraline (ZOLOFT) 100 MG tablet TAKE 1/2 TABLET BY MOUTH (50 MG) BY MOUTH DAILY   simvastatin (ZOCOR) 10 MG tablet TAKE 1/2 TABLET (5 MG TOTAL) BY MOUTH AT BEDTIME.   traMADol (ULTRAM) 50 MG tablet Take 1-2 tablets as needed for severe pain.  Not to exceed two doses (4 tablets, 200 mg) daily   traZODone (DESYREL) 100 MG tablet TAKE 1 TABLET BY MOUTH EVERYDAY AT BEDTIME   [DISCONTINUED] ALPRAZolam (XANAX) 0.25 MG tablet Take 1 tablet (0.25 mg total) by mouth daily as needed for anxiety.   [DISCONTINUED] pantoprazole (PROTONIX) 40 MG tablet Take 1 tablet (40  mg total) by mouth 2 (two) times daily before a meal.   [DISCONTINUED] Semaglutide,0.25 or 0.5MG /DOS, (OZEMPIC, 0.25 OR 0.5 MG/DOSE,) 2 MG/3ML SOPN INJECT 0.5 MG INTO THE SKIN ONE TIME PER WEEK   No facility-administered encounter medications on file as of 10/07/2023.     Lab Results  Component Value Date   WBC 4.1 06/28/2023   HGB 12.7 06/28/2023   HCT 38.7 06/28/2023   PLT 186.0 06/28/2023   GLUCOSE 100 (H) 10/02/2023   CHOL 138 10/02/2023   TRIG 98.0 10/02/2023   HDL 64.90 10/02/2023   LDLDIRECT 146.5 09/19/2012   LDLCALC 54 10/02/2023   ALT 13 10/02/2023   AST 18 10/02/2023   NA 142 10/02/2023   K 3.9 10/02/2023   CL 106 10/02/2023   CREATININE 0.78 10/02/2023   BUN 19 10/02/2023   CO2 27 10/02/2023   TSH 1.62 03/25/2023   INR 1.0 08/21/2019   HGBA1C 5.7 10/02/2023   MICROALBUR 0.7 06/28/2023    DG ESOPHAGUS W SINGLE CM (SOL OR THIN BA)  Result Date: 02/04/2023 CLINICAL DATA:  Esophageal dysphagia, history of bariatric surgery in 2004. EXAM: ESOPHOGRAM/BARIUM SWALLOW TECHNIQUE: Single contrast examination was performed using  thick barium. FLUOROSCOPY: Radiation Exposure Index (as provided by the fluoroscopic device): 15.1 mGy COMPARISON:  07/29/2019 FINDINGS: Normal pharyngeal anatomy and motility. Contrast flowed freely through the esophagus without evidence of a stricture or mass. Normal esophageal mucosa without evidence of irregularity or ulceration. 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 No evidence of reflux. No definite hiatal hernia was demonstrated. Gastric lap band in satisfactory position. Severe luminal narrowing resulting from the gastric band with only a trace amount of contrast traversing the gastric band over a 3-4 minute period. IMPRESSION: 1. Gastric lap band in satisfactory position. Severe luminal narrowing resulting from the gastric band with only a trace amount of contrast traversing the gastric band over a 3-4 minute  period. Recommend bariatric surgery consultation as soon as possible. 2. 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 Electronically Signed   By: Elige Ko M.D.   On: 02/04/2023 11:47       Assessment & Plan:  Trigger ring finger of left hand Assessment & Plan: Saw Dr Rosita Kea for trigger finger - left ring finger.  Has had two injections.  Planning for surgery/procedure.   Status post bariatric surgery Assessment & Plan: Saw GI 05/24/23.  Dysphagia secondary to tight LAGB. Resolved after fluid removed. Doing well.  Tolerating ozempic.    Lumbar stenosis with neurogenic claudication Assessment & Plan: Sees Dr Danielle Dess for her back. S/p fall. Left knee - Dr Odis Luster. Does report problems with both legs.  Describes numbness.  Also report weakness - upper thighs.  Does have significant back issues as outlined.  Is limited in walking due to her legs.  Discussed using a walker to help with ambulation. Does better in stores with a cart she can lean on. Has seen Dr Sherryll Burger as outlined.    Primary hypertension Assessment & Plan: Blood pressure as outlined.  Continue losartan/hctz.  Follow pressures.  Follow metabolic panel.  Hypercholesterolemia Assessment & Plan: Continue simvastatin.  Low cholesterol diet and exercise.  Follow lipid panel and liver function tests.     Gastroesophageal reflux disease, unspecified whether esophagitis present Assessment & Plan: No acid reflux reported.  Off protonix.    Diabetes mellitus type 2, diet-controlled (HCC) Assessment & Plan: Low carb diet and exercise.  Follow met b and a1c.   Desires to stay GLP 1 agonist as outlined.  Saw GI 05/24/23.  Dysphagia secondary to tight LAGB. Resolved after fluid removed. GI had recommended starting ozempic for diabetes and weight loss.  She is tolerating ozempic.  Continue diet and exercise as tolerated.  Lab Results  Component Value Date   HGBA1C 5.7 10/02/2023     Aortic  atherosclerosis (HCC) Assessment & Plan: Continue simvastatin.    Other orders -     Semaglutide (1 MG/DOSE); Inject 1 mg as directed once a week.  Dispense: 3 mL; Refill: 3     Dale Woodford, MD

## 2023-10-09 ENCOUNTER — Ambulatory Visit: Payer: Medicare HMO | Admitting: Physical Therapy

## 2023-10-09 ENCOUNTER — Telehealth: Payer: Self-pay | Admitting: Internal Medicine

## 2023-10-09 NOTE — Telephone Encounter (Signed)
Prescription Request  10/09/2023  LOV: 10/07/2023  What is the name of the medication or equipment? xanax  Have you contacted your pharmacy to request a refill? No   Which pharmacy would you like this sent to?  [id number] - [place] - [address] [place] [address] Phone: [phone] Fax: [phone]    Patient notified that their request is being sent to the clinical staff for review and that they should receive a response within 2 business days.   Please advise at Mobile [phone] (mobile)

## 2023-10-10 ENCOUNTER — Encounter: Payer: Self-pay | Admitting: *Deleted

## 2023-10-10 MED ORDER — ALPRAZOLAM 0.25 MG PO TABS: 0.2500 mg | ORAL_TABLET | Freq: Every day | ORAL | 0 refills | Status: AC | PRN

## 2023-10-10 NOTE — Telephone Encounter (Signed)
Notify - I sent in rx for xanax to have if needed.

## 2023-10-12 ENCOUNTER — Encounter: Payer: Self-pay | Admitting: Internal Medicine

## 2023-10-12 NOTE — Assessment & Plan Note (Signed)
Low carb diet and exercise.  Follow met b and a1c.   Desires to stay GLP 1 agonist as outlined.  Saw GI 05/24/23.  Dysphagia secondary to tight LAGB. Resolved after fluid removed. GI had recommended starting ozempic for diabetes and weight loss.  She is tolerating ozempic.  Continue diet and exercise as tolerated.  Lab Results  Component Value Date   HGBA1C 5.7 10/02/2023

## 2023-10-12 NOTE — Assessment & Plan Note (Signed)
Sees Dr Danielle Dess for her back. S/p fall. Left knee - Dr Odis Luster. Does report problems with both legs.  Describes numbness.  Also report weakness - upper thighs.  Does have significant back issues as outlined.  Is limited in walking due to her legs.  Discussed using a walker to help with ambulation. Does better in stores with a cart she can lean on. Has seen Dr Sherryll Burger as outlined.

## 2023-10-12 NOTE — Assessment & Plan Note (Signed)
Saw Dr Rosita Kea for trigger finger - left ring finger.  Has had two injections.  Planning for surgery/procedure.

## 2023-10-12 NOTE — Assessment & Plan Note (Signed)
Continue simvastatin. 

## 2023-10-12 NOTE — Assessment & Plan Note (Signed)
Blood pressure as outlined.  Continue losartan/hctz.  Follow pressures.  Follow metabolic panel.

## 2023-10-12 NOTE — Assessment & Plan Note (Signed)
No acid reflux reported.  Off protonix.

## 2023-10-12 NOTE — Assessment & Plan Note (Signed)
Continue simvastatin.  Low cholesterol diet and exercise.  Follow lipid panel and liver function tests.

## 2023-10-12 NOTE — Assessment & Plan Note (Signed)
Saw GI 05/24/23.  Dysphagia secondary to tight LAGB. Resolved after fluid removed. Doing well.  Tolerating ozempic.

## 2023-10-14 ENCOUNTER — Ambulatory Visit: Payer: Medicare HMO | Admitting: Physical Therapy

## 2023-10-16 ENCOUNTER — Encounter: Payer: Medicare HMO | Admitting: Physical Therapy

## 2023-10-18 ENCOUNTER — Other Ambulatory Visit: Payer: Self-pay | Admitting: Internal Medicine

## 2023-10-21 ENCOUNTER — Encounter: Payer: Medicare HMO | Admitting: Physical Therapy

## 2023-10-21 DIAGNOSIS — M65342 Trigger finger, left ring finger: Secondary | ICD-10-CM | POA: Diagnosis not present

## 2023-10-22 ENCOUNTER — Other Ambulatory Visit: Payer: Self-pay | Admitting: Internal Medicine

## 2023-10-22 DIAGNOSIS — Z1231 Encounter for screening mammogram for malignant neoplasm of breast: Secondary | ICD-10-CM

## 2023-10-23 ENCOUNTER — Encounter: Payer: Medicare HMO | Admitting: Physical Therapy

## 2023-10-24 ENCOUNTER — Ambulatory Visit: Payer: Medicare HMO | Admitting: Physical Therapy

## 2023-10-28 ENCOUNTER — Encounter: Payer: Medicare HMO | Admitting: Physical Therapy

## 2023-10-28 NOTE — Progress Notes (Signed)
 [facility] Office Note  Date:  10/28/2023   ID:  Erin Camacho, DOB [birth year], MRN [id number]  PCP:  Dale Catron, MD   No chief complaint on file.   HPI:  77 year old woman with history of Hypertension Hyperlipidemia Borderline diabetes Non smoker, GERD Anxiety obesity Total hip replacement, 2013 Chronic low back pain 2015, 2017 History of bariatric surgery, LAP band, lost 70 pounds Who presents for follow-up of her shortness of breath with exertion  Last seen in clinic by myself December 2023  Was undergoing back surgery at the time Breathing was stable  Husband fell x2 this year, Doing more Balance issues Sore ankle on right No exercise  Lasb reviewed A1C 6.2 Total chol 159, LDL 83  Carotid u/s Minor carotid atherosclerosis and intimal thickening.  Degree of narrowing less than 50% bilaterally by ultrasound criteria.  EKG personally reviewed by myself on todays visit NSR, rate 59 bpm , LAD, no change  Previous imaging studies reviewed CT scan Atherosclerotic calcification in the aorta and iliacs without aneurysm. Very mild  Carotid ultrasound July 2018 Less than 39% bilaterally  EKG personally reviewed by myself on todays visit Shows normal sinus rhythm rate 59 bpm left anterior fascicular block  Echocardiogram September 2018, normal ejection fraction 60%  PMH:   has a past medical history of Allergy, Anxiety, Arthritis, Chronic back pain, Diabetes mellitus, Dyspnea, GERD (gastroesophageal reflux disease), History of hiatal hernia, Hyperlipidemia, Hypertension, Osteoarthritis, Pneumonia, Spinal stenosis, Type 2 diabetes mellitus (HCC), and UTI (lower urinary tract infection).  PSH:    Past Surgical History:  Procedure Laterality Date   ABDOMINAL HYSTERECTOMY  1973   ABDOMINAL HYSTERECTOMY     APPENDECTOMY     APPLICATION OF ROBOTIC ASSISTANCE FOR SPINAL PROCEDURE N/A 09/15/2018   Procedure: APPLICATION OF ROBOTIC ASSISTANCE FOR SPINAL  PROCEDURE;  Surgeon: Barnett Abu, MD;  Location: [facility];  Service: Neurosurgery;  Laterality: N/A;   BACK SURGERY  04/17/13, 2017, 2019   CATARACT EXTRACTION W/PHACO Left 12/16/2019   Procedure: CATARACT EXTRACTION PHACO AND INTRAOCULAR LENS PLACEMENT (IOC) LEFT PANOPTIX LENS DIABETIC 8.07 01:19.2 10.2%;  Surgeon: Lockie Mola, MD;  Location: [facility];  Service: Ophthalmology;  Laterality: Left;  Diabetic - oral meds   CATARACT EXTRACTION W/PHACO Right 01/06/2020   Procedure: CATARACT EXTRACTION PHACO AND INTRAOCULAR LENS PLACEMENT (IOC) PANOPTIX LENS RIGHT DIABETIC 10.73 01:08.6 15.7%;  Surgeon: Lockie Mola, MD;  Location: [facility];  Service: Ophthalmology;  Laterality: Right;  diabetic   CHOLECYSTECTOMY  1995   COLONOSCOPY WITH PROPOFOL N/A 03/15/2021   Procedure: COLONOSCOPY WITH PROPOFOL;  Surgeon: Earline Mayotte, MD;  Location: [facility] ENDOSCOPY;  Service: Endoscopy;  Laterality: N/A;   ESOPHAGOGASTRODUODENOSCOPY N/A 06/20/2022   Procedure: ESOPHAGOGASTRODUODENOSCOPY (EGD);  Surgeon: Toledo, Boykin Nearing, MD;  Location: [facility] ENDOSCOPY;  Service: Gastroenterology;  Laterality: N/A;   EYE SURGERY  2021   bilateral cataract removal with lens implants   HERNIA REPAIR  08/2006   lap band  08/2003   LUMBAR LAMINECTOMY/ DECOMPRESSION WITH MET-RX Left 12/29/2020   Procedure: Left Thoracic Seven-Eight Microdiscectomy;  Surgeon: Barnett Abu, MD;  Location: [facility] OR;  Service: Neurosurgery;  Laterality: Left;   SEPTOPLASTY  04/1978   TONSILLECTOMY AND ADENOIDECTOMY  1979   TOTAL HIP ARTHROPLASTY  02/2012   Right, Dr. Ernest Pine   TOTAL HIP ARTHROPLASTY Left 08/31/2019   Procedure: TOTAL HIP ARTHROPLASTY;  Surgeon: Donato Heinz, MD;  Location: [facility] ORS;  Service: Orthopedics;  Laterality: Left;   UPPER GI ENDOSCOPY  06/20/2022    Current Outpatient Medications  Medication Sig Dispense Refill   ALPRAZolam (XANAX) 0.25 MG tablet Take 1 tablet (0.25 mg total) by  mouth daily as needed for anxiety. 20 tablet 0   cholecalciferol (VITAMIN D) 25 MCG (1000 UNIT) tablet Take 1,000 Units by mouth daily.     Ferrous Sulfate (IRON) 325 (65 Fe) MG TABS Take 325 mg by mouth daily.     fexofenadine (ALLEGRA) 180 MG tablet Take 1 tablet (180 mg total) by mouth daily. 90 tablet 4   fluticasone (FLONASE) 50 MCG/ACT nasal spray SPRAY 2 SPRAYS INTO EACH NOSTRIL EVERY DAY 48 mL 1   gabapentin (NEURONTIN) 300 MG capsule Take 300 mg by mouth 2 (two) times daily.     lidocaine (LIDODERM) 5 % SMARTSIG:2 Patch(s) T-DERMAL Daily     losartan-hydrochlorothiazide (HYZAAR) 50-12.5 MG tablet TAKE 1 TABLET BY MOUTH EVERY DAY 90 tablet 2   metFORMIN (GLUCOPHAGE) 500 MG tablet TAKE 1 TABLET (500 MG TOTAL) BY MOUTH DAILY. 90 tablet 1   Multiple Vitamin (MULTIVITAMIN WITH MINERALS) TABS tablet Take 1 tablet by mouth daily.     OneTouch Delica Lancets 30G MISC Use to check blood sugar x per day. DX E11.9. 100 each 3   ONETOUCH VERIO test strip USE TO CHECK BLOOD SUGAR TWICE A DAY 100 strip 12   Semaglutide, 1 MG/DOSE, 4 MG/3ML SOPN Inject 1 mg as directed once a week. 3 mL 3   sertraline (ZOLOFT) 100 MG tablet TAKE 1/2 TABLET BY MOUTH (50 MG) BY MOUTH DAILY 45 tablet 1   simvastatin (ZOCOR) 10 MG tablet TAKE 1/2 TABLET (5 MG TOTAL) BY MOUTH AT BEDTIME. 45 tablet 3   traMADol (ULTRAM) 50 MG tablet Take 1-2 tablets as needed for severe pain. Not to exceed two doses (4 tablets, 200 mg) daily 15 tablet 0   traZODone (DESYREL) 100 MG tablet TAKE 1 TABLET BY MOUTH EVERYDAY AT BEDTIME 90 tablet 1   No current facility-administered medications for this visit.     Allergies:   Adhesive [tape]   Social History:  The patient  reports that she has never smoked. She has never used smokeless tobacco. She reports current alcohol use. She reports that she does not use drugs.   Family History:   family history includes Arthritis in her sister; Breast cancer (age of onset: 59) in an other family  member; Cancer in her sister and another family member; Dementia in her brother and brother; Diabetes in her brother, brother, sister, and another family member; Heart disease in her father and mother; Hypertension in her brother, brother, father, and mother; Stroke in her father and mother.    Review of Systems: Review of Systems  Constitutional: Negative.   HENT: Negative.    Respiratory: Negative.    Cardiovascular: Negative.   Gastrointestinal: Negative.   Musculoskeletal:  Positive for back pain.  Neurological: Negative.   Psychiatric/Behavioral: Negative.    All other systems reviewed and are negative.   PHYSICAL EXAM: VS:  There were no vitals taken for this visit. , BMI There is no height or weight on file to calculate BMI. Constitutional:  oriented to person, place, and time. No distress.  HENT:  Head: Grossly normal Eyes:  no discharge. No scleral icterus.  Neck: No JVD, no carotid bruits  Cardiovascular: Regular rate and rhythm, no murmurs appreciated Pulmonary/Chest: Clear to auscultation bilaterally, no wheezes or rails Abdominal: Soft.  no distension.  no tenderness.  Musculoskeletal: Normal range of motion  Neurological:  normal muscle tone. Coordination normal. No atrophy Skin: Skin warm and dry Psychiatric: normal affect, pleasant  Recent Labs: 03/25/2023: TSH 1.62 06/28/2023: Hemoglobin 12.7; Platelets 186.0 10/02/2023: ALT 13; BUN 19; Creatinine, Ser 0.78; Potassium 3.9; Sodium 142    Lipid Panel Lab Results  Component Value Date   CHOL 138 10/02/2023   HDL 64.90 10/02/2023   LDLCALC 54 10/02/2023   TRIG 98.0 10/02/2023      Wt Readings from Last 3 Encounters:  10/07/23 208 lb (94.3 kg)  07/05/23 212 lb (96.2 kg)  04/01/23 204 lb (92.5 kg)     ASSESSMENT AND PLAN:  Aortic atherosclerosis (HCC) - Plan: EKG 12-Lead CT scan showing minimal aortic atherosclerosis noted Continue simvastatin Cholesterol reasonable, 159  Shortness of breath - Plan:  EKG 12-Lead secondary to deconditioning, obesity Weight trending higher, dietary discretion Completed back surgery, recommended regular walking program  Essential hypertension - Plan: EKG 12-Lead Blood pressure is well controlled on today's visit. No changes made to the medications.  Hypercholesterolemia Reasonable numbers on simvastatin  Diabetes mellitus type 2, diet-controlled (HCC) Hemoglobin A1 6 Recommend low carbohydrate diet, walking program  Morbid  obesity Weight trending upwards Walking program for conditioning    Total encounter time more than 30 minutes  Greater than 50% was spent in counseling and coordination of care with the patient   No orders of the defined types were placed in this encounter.    Signed, Dossie Arbour, M.D., Ph.D. 10/28/2023  [facility] [place], [place] [phone]

## 2023-10-29 ENCOUNTER — Encounter: Payer: Self-pay | Admitting: Cardiovascular Disease

## 2023-10-29 ENCOUNTER — Ambulatory Visit: Payer: Medicare HMO | Attending: Cardiovascular Disease | Admitting: Cardiovascular Disease

## 2023-10-29 VITALS — BP 110/62 | HR 72 | Ht 65.0 in | Wt 203.5 lb

## 2023-10-29 DIAGNOSIS — I6523 Occlusion and stenosis of bilateral carotid arteries: Secondary | ICD-10-CM | POA: Diagnosis not present

## 2023-10-29 DIAGNOSIS — E7800 Pure hypercholesterolemia, unspecified: Secondary | ICD-10-CM

## 2023-10-29 DIAGNOSIS — I700 Atherosclerosis of aorta: Secondary | ICD-10-CM

## 2023-10-29 DIAGNOSIS — E782 Mixed hyperlipidemia: Secondary | ICD-10-CM

## 2023-10-29 DIAGNOSIS — I10 Essential (primary) hypertension: Secondary | ICD-10-CM | POA: Diagnosis not present

## 2023-10-29 NOTE — Patient Instructions (Signed)

## 2023-10-31 ENCOUNTER — Encounter: Payer: Medicare HMO | Admitting: Physical Therapy

## 2023-11-04 ENCOUNTER — Encounter: Payer: Medicare HMO | Admitting: Physical Therapy

## 2023-11-07 ENCOUNTER — Encounter: Payer: Medicare HMO | Admitting: Physical Therapy

## 2023-11-14 ENCOUNTER — Encounter: Payer: Medicare HMO | Admitting: Physical Therapy

## 2023-11-18 ENCOUNTER — Encounter: Payer: Medicare HMO | Admitting: Physical Therapy

## 2023-11-18 DIAGNOSIS — M48062 Spinal stenosis, lumbar region with neurogenic claudication: Secondary | ICD-10-CM | POA: Diagnosis not present

## 2023-11-18 DIAGNOSIS — M5104 Intervertebral disc disorders with myelopathy, thoracic region: Secondary | ICD-10-CM | POA: Diagnosis not present

## 2023-11-18 DIAGNOSIS — Z6833 Body mass index (BMI) 33.0-33.9, adult: Secondary | ICD-10-CM | POA: Diagnosis not present

## 2023-11-19 ENCOUNTER — Ambulatory Visit
Admission: RE | Admit: 2023-11-19 | Discharge: 2023-11-19 | Disposition: A | Discharge status: Home / Self Care | Payer: Medicare HMO | Source: Ambulatory Visit | Attending: Internal Medicine | Admitting: Internal Medicine

## 2023-11-19 DIAGNOSIS — Z1231 Encounter for screening mammogram for malignant neoplasm of breast: Secondary | ICD-10-CM | POA: Insufficient documentation

## 2023-11-21 ENCOUNTER — Encounter: Payer: Medicare HMO | Admitting: Physical Therapy

## 2023-11-25 DIAGNOSIS — M5414 Radiculopathy, thoracic region: Secondary | ICD-10-CM | POA: Diagnosis not present

## 2023-11-25 DIAGNOSIS — M5114 Intervertebral disc disorders with radiculopathy, thoracic region: Secondary | ICD-10-CM | POA: Diagnosis not present

## 2023-11-26 ENCOUNTER — Encounter: Payer: Self-pay | Admitting: Internal Medicine

## 2023-11-26 NOTE — Telephone Encounter (Signed)
 Please call and get more information. What symptoms is he having now?  Any nausea, vomiting? Are bowels moving? Any breathing change?  New symptoms?

## 2023-11-26 NOTE — Telephone Encounter (Signed)
 LMTCB

## 2023-11-27 NOTE — Telephone Encounter (Signed)
 Discussed - noted.  Ok with request for referral as discussed.

## 2023-11-27 NOTE — Telephone Encounter (Signed)
 Spoke with patients wife. Scheduled patient for an appt. He is not having any new acute symptoms. Breathing issues are persistent. He is persistently losing weight. Last weight in office was 181 and recent weight at home 167. No nausea, vomiting, diarrhea, etc. Pt has started drinking ensure. Wife relates to stress from new dx of pulmonary fibrosis. Wants to see pulmonary sooner than 2/27. Will assist with this (see me.) Pt has been scheduled to see you next week and was advised if any new, acute or worsening symptoms- will be seen sooner. Pt and wife agreed.

## 2023-11-28 NOTE — Telephone Encounter (Signed)
 Referral placed.

## 2023-12-05 DIAGNOSIS — H04123 Dry eye syndrome of bilateral lacrimal glands: Secondary | ICD-10-CM | POA: Diagnosis not present

## 2023-12-05 DIAGNOSIS — Z961 Presence of intraocular lens: Secondary | ICD-10-CM | POA: Diagnosis not present

## 2023-12-05 DIAGNOSIS — E119 Type 2 diabetes mellitus without complications: Secondary | ICD-10-CM | POA: Diagnosis not present

## 2023-12-05 LAB — HM DIABETES EYE EXAM

## 2023-12-06 ENCOUNTER — Other Ambulatory Visit: Payer: Self-pay | Admitting: Internal Medicine

## 2023-12-09 ENCOUNTER — Telehealth: Payer: Self-pay

## 2023-12-09 ENCOUNTER — Telehealth: Payer: Self-pay | Admitting: Internal Medicine

## 2023-12-09 NOTE — Telephone Encounter (Signed)
Copied from CRM [id number]. Topic: General - Other >> Dec 09, 2023  9:54 AM Kathryne Eriksson wrote: Reason for CRM: Medication >> Dec 09, 2023  9:57 AM Kathryne Eriksson wrote: Patient states she received a call from the insurance company "[employer]" regarding a medication, they're needing some information from [facility] MD office.  Patient call back number [phone]

## 2023-12-09 NOTE — Telephone Encounter (Signed)
[employer]  IS CALLING IN REGARDING A PA FOR OZEMPIC MEDICATION

## 2023-12-10 ENCOUNTER — Encounter: Payer: Self-pay | Admitting: Internal Medicine

## 2023-12-10 ENCOUNTER — Other Ambulatory Visit (HOSPITAL_COMMUNITY): Payer: Self-pay

## 2023-12-10 ENCOUNTER — Other Ambulatory Visit: Payer: Self-pay | Admitting: Internal Medicine

## 2023-12-11 NOTE — Telephone Encounter (Signed)
Per patient, this has already been addressed.

## 2023-12-16 DIAGNOSIS — Z7982 Long term (current) use of aspirin: Secondary | ICD-10-CM | POA: Diagnosis not present

## 2023-12-16 DIAGNOSIS — J309 Allergic rhinitis, unspecified: Secondary | ICD-10-CM | POA: Diagnosis not present

## 2023-12-16 DIAGNOSIS — E785 Hyperlipidemia, unspecified: Secondary | ICD-10-CM | POA: Diagnosis not present

## 2023-12-16 DIAGNOSIS — F419 Anxiety disorder, unspecified: Secondary | ICD-10-CM | POA: Diagnosis not present

## 2023-12-16 DIAGNOSIS — E1169 Type 2 diabetes mellitus with other specified complication: Secondary | ICD-10-CM | POA: Diagnosis not present

## 2023-12-16 DIAGNOSIS — F341 Dysthymic disorder: Secondary | ICD-10-CM | POA: Diagnosis not present

## 2023-12-16 DIAGNOSIS — Z9849 Cataract extraction status, unspecified eye: Secondary | ICD-10-CM | POA: Diagnosis not present

## 2023-12-16 DIAGNOSIS — G4700 Insomnia, unspecified: Secondary | ICD-10-CM | POA: Diagnosis not present

## 2023-12-16 DIAGNOSIS — M8580 Other specified disorders of bone density and structure, unspecified site: Secondary | ICD-10-CM | POA: Diagnosis not present

## 2023-12-16 DIAGNOSIS — G8929 Other chronic pain: Secondary | ICD-10-CM | POA: Diagnosis not present

## 2023-12-16 DIAGNOSIS — E669 Obesity, unspecified: Secondary | ICD-10-CM | POA: Diagnosis not present

## 2023-12-16 DIAGNOSIS — I10 Essential (primary) hypertension: Secondary | ICD-10-CM | POA: Diagnosis not present

## 2023-12-16 NOTE — Telephone Encounter (Signed)
Patient is aware of the available refill.

## 2023-12-18 DIAGNOSIS — M7672 Peroneal tendinitis, left leg: Secondary | ICD-10-CM | POA: Diagnosis not present

## 2023-12-18 DIAGNOSIS — E119 Type 2 diabetes mellitus without complications: Secondary | ICD-10-CM | POA: Diagnosis not present

## 2023-12-18 DIAGNOSIS — M79672 Pain in left foot: Secondary | ICD-10-CM | POA: Diagnosis not present

## 2023-12-18 DIAGNOSIS — M2041 Other hammer toe(s) (acquired), right foot: Secondary | ICD-10-CM | POA: Diagnosis not present

## 2023-12-18 DIAGNOSIS — M79671 Pain in right foot: Secondary | ICD-10-CM | POA: Diagnosis not present

## 2023-12-18 DIAGNOSIS — G8929 Other chronic pain: Secondary | ICD-10-CM | POA: Diagnosis not present

## 2023-12-18 DIAGNOSIS — L97511 Non-pressure chronic ulcer of other part of right foot limited to breakdown of skin: Secondary | ICD-10-CM | POA: Diagnosis not present

## 2024-01-24 ENCOUNTER — Other Ambulatory Visit: Payer: Self-pay | Admitting: Internal Medicine

## 2024-01-29 ENCOUNTER — Other Ambulatory Visit: Payer: Self-pay

## 2024-01-29 DIAGNOSIS — E7800 Pure hypercholesterolemia, unspecified: Secondary | ICD-10-CM

## 2024-01-29 DIAGNOSIS — E119 Type 2 diabetes mellitus without complications: Secondary | ICD-10-CM

## 2024-01-30 ENCOUNTER — Other Ambulatory Visit: Payer: Medicare HMO

## 2024-01-30 DIAGNOSIS — E7800 Pure hypercholesterolemia, unspecified: Secondary | ICD-10-CM

## 2024-01-30 DIAGNOSIS — E119 Type 2 diabetes mellitus without complications: Secondary | ICD-10-CM | POA: Diagnosis not present

## 2024-01-30 LAB — LIPID PANEL
Cholesterol: 145 mg/dL (ref 0–200)
HDL: 66.6 mg/dL
LDL Cholesterol: 62 mg/dL (ref 0–99)
NonHDL: 78.1
Total CHOL/HDL Ratio: 2
Triglycerides: 83 mg/dL (ref 0.0–149.0)
VLDL: 16.6 mg/dL (ref 0.0–40.0)

## 2024-01-30 LAB — HEPATIC FUNCTION PANEL
ALT: 18 U/L (ref 0–35)
AST: 23 U/L (ref 0–37)
Albumin: 4.2 g/dL (ref 3.5–5.2)
Alkaline Phosphatase: 68 U/L (ref 39–117)
Bilirubin, Direct: 0.2 mg/dL (ref 0.0–0.3)
Total Bilirubin: 0.7 mg/dL (ref 0.2–1.2)
Total Protein: 6.5 g/dL (ref 6.0–8.3)

## 2024-01-30 LAB — BASIC METABOLIC PANEL WITH GFR
BUN: 20 mg/dL (ref 6–23)
CO2: 30 meq/L (ref 19–32)
Calcium: 9.9 mg/dL (ref 8.4–10.5)
Chloride: 104 meq/L (ref 96–112)
Creatinine, Ser: 0.79 mg/dL (ref 0.40–1.20)
GFR: 71.49 mL/min
Glucose, Bld: 108 mg/dL — ABNORMAL HIGH (ref 70–99)
Potassium: 3.7 meq/L (ref 3.5–5.1)
Sodium: 142 meq/L (ref 135–145)

## 2024-01-30 LAB — HEMOGLOBIN A1C: Hgb A1c MFr Bld: 5.8 % (ref 4.6–6.5)

## 2024-02-04 ENCOUNTER — Ambulatory Visit (INDEPENDENT_AMBULATORY_CARE_PROVIDER_SITE_OTHER): Payer: Medicare HMO | Admitting: Internal Medicine

## 2024-02-04 VITALS — BP 128/72 | HR 72 | Temp 97.9°F | Resp 16 | Ht 65.0 in | Wt 203.2 lb

## 2024-02-04 DIAGNOSIS — E119 Type 2 diabetes mellitus without complications: Secondary | ICD-10-CM | POA: Diagnosis not present

## 2024-02-04 DIAGNOSIS — F320 Major depressive disorder, single episode, mild: Secondary | ICD-10-CM | POA: Diagnosis not present

## 2024-02-04 DIAGNOSIS — M65342 Trigger finger, left ring finger: Secondary | ICD-10-CM

## 2024-02-04 DIAGNOSIS — I10 Essential (primary) hypertension: Secondary | ICD-10-CM | POA: Diagnosis not present

## 2024-02-04 DIAGNOSIS — I700 Atherosclerosis of aorta: Secondary | ICD-10-CM | POA: Diagnosis not present

## 2024-02-04 DIAGNOSIS — E7800 Pure hypercholesterolemia, unspecified: Secondary | ICD-10-CM | POA: Diagnosis not present

## 2024-02-04 DIAGNOSIS — Z9884 Bariatric surgery status: Secondary | ICD-10-CM | POA: Diagnosis not present

## 2024-02-04 DIAGNOSIS — K219 Gastro-esophageal reflux disease without esophagitis: Secondary | ICD-10-CM

## 2024-02-04 LAB — HM DIABETES FOOT EXAM

## 2024-02-04 NOTE — Progress Notes (Signed)
 Subjective:    Patient ID: Erin Camacho, female    DOB: 12-Jul-1946, 78 y.o.   MRN: [id number]  Patient here for  Chief Complaint  Patient presents with   Medical Management of Chronic Issues    HPI Here for a scheduled follow up. Recently evaluated for dysphagia and reflux symptoms. History of lap gastric band placement. Saw bariatric surgery - recommended removing fluid from band. Saw GI 05/24/23.  Dysphagia secondary to tight LAGB. Resolved after fluid removed.  Saw Dr Sherryll Burger 09/17/23 - evaluation - bilateral lower extremity pain and numbness. Recommended continuing PT. Also recommended aspirin 81mg  q day - mild to moderate white matter changes. Is s/p trigger finger release 10/21/23 - Dr Rosita Kea. Had f/u with cardiology 10/29/23 - stable. Increased stress related to her husband's health issues.  Discussed. Does have good support. Breathing stable.    Past Medical History:  Diagnosis Date   Allergy    hay fever   Anxiety    Arthritis    Chronic back pain    Diabetes mellitus    Dyspnea    a. 07/2017 Echo: EF 60-65%. No rwma. Mildly dil LA. Nl RV fxn.   GERD (gastroesophageal reflux disease)    History of hiatal hernia    Hyperlipidemia    Hypertension    Osteoarthritis    Pneumonia    Spinal stenosis    Type 2 diabetes mellitus (HCC)    UTI (lower urinary tract infection)    Past Surgical History:  Procedure Laterality Date   ABDOMINAL HYSTERECTOMY  1973   ABDOMINAL HYSTERECTOMY     APPENDECTOMY     APPLICATION OF ROBOTIC ASSISTANCE FOR SPINAL PROCEDURE N/A 09/15/2018   Procedure: APPLICATION OF ROBOTIC ASSISTANCE FOR SPINAL PROCEDURE;  Surgeon: Barnett Abu, MD;  Location: [facility] OR;  Service: Neurosurgery;  Laterality: N/A;   BACK SURGERY  04/17/13, 2017, 2019   CATARACT EXTRACTION W/PHACO Left 12/16/2019   Procedure: CATARACT EXTRACTION PHACO AND INTRAOCULAR LENS PLACEMENT (IOC) LEFT PANOPTIX LENS DIABETIC 8.07 01:19.2 10.2%;  Surgeon: Lockie Mola, MD;  Location: [facility];  Service: Ophthalmology;  Laterality: Left;  Diabetic - oral meds   CATARACT EXTRACTION W/PHACO Right 01/06/2020   Procedure: CATARACT EXTRACTION PHACO AND INTRAOCULAR LENS PLACEMENT (IOC) PANOPTIX LENS RIGHT DIABETIC 10.73 01:08.6 15.7%;  Surgeon: Lockie Mola, MD;  Location: [facility];  Service: Ophthalmology;  Laterality: Right;  diabetic   CHOLECYSTECTOMY  1995   COLONOSCOPY WITH PROPOFOL N/A 03/15/2021   Procedure: COLONOSCOPY WITH PROPOFOL;  Surgeon: Earline Mayotte, MD;  Location: [facility] ENDOSCOPY;  Service: Endoscopy;  Laterality: N/A;   ESOPHAGOGASTRODUODENOSCOPY N/A 06/20/2022   Procedure: ESOPHAGOGASTRODUODENOSCOPY (EGD);  Surgeon: Toledo, Boykin Nearing, MD;  Location: [facility] ENDOSCOPY;  Service: Gastroenterology;  Laterality: N/A;   EYE SURGERY  2021   bilateral cataract removal with lens implants   HERNIA REPAIR  08/2006   lap band  08/2003   LUMBAR LAMINECTOMY/ DECOMPRESSION WITH MET-RX Left 12/29/2020   Procedure: Left Thoracic Seven-Eight Microdiscectomy;  Surgeon: Barnett Abu, MD;  Location: [facility] OR;  Service: Neurosurgery;  Laterality: Left;   SEPTOPLASTY  04/1978   TONSILLECTOMY AND ADENOIDECTOMY  1979   TOTAL HIP ARTHROPLASTY  02/2012   Right, Dr. Ernest Pine   TOTAL HIP ARTHROPLASTY Left 08/31/2019   Procedure: TOTAL HIP ARTHROPLASTY;  Surgeon: Donato Heinz, MD;  Location: [facility] ORS;  Service: Orthopedics;  Laterality: Left;   UPPER GI ENDOSCOPY  06/20/2022   Family History  Problem Relation Age of Onset  Hypertension Mother    Stroke Mother    Heart disease Mother    Hypertension Father    Stroke Father    Heart disease Father    Arthritis Sister    Diabetes Sister    Cancer Sister    Diabetes Brother    Hypertension Brother    Dementia Brother    Diabetes Brother    Hypertension Brother    Dementia Brother    Breast cancer Other 52   Cancer Other        breast   Diabetes Other    Kidney disease Neg Hx    Bladder Cancer Neg Hx     Social History   Socioeconomic History   Marital status: Married    Spouse name: Not on file   Number of children: Not on file   Years of education: Not on file   Highest education level: 12th grade  Occupational History   Not on file  Tobacco Use   Smoking status: Never   Smokeless tobacco: Never  Vaping Use   Vaping status: Never Used  Substance and Sexual Activity   Alcohol use: Yes    Alcohol/week: 0.0 - 1.0 standard drinks of alcohol    Comment: RARELY   Drug use: No   Sexual activity: Not Currently  Other Topics Concern   Not on file  Social History Narrative   Lives in [place] with husband.      Work - retired Surveyor, minerals   Social Drivers of Corporate investment banker Strain: Low Risk  (03/28/2023)   Overall Financial Resource Strain (CARDIA)    Difficulty of Paying Living Expenses: Not hard at all  Food Insecurity: No Food Insecurity (03/28/2023)   Hunger Vital Sign    Worried About Running Out of Food in the Last Year: Never true    Ran Out of Food in the Last Year: Never true  Transportation Needs: No Transportation Needs (03/28/2023)   PRAPARE - Administrator, Civil Service (Medical): No    Lack of Transportation (Non-Medical): No  Physical Activity: Insufficiently Active (03/28/2023)   Exercise Vital Sign    Days of Exercise per Week: 1 day    Minutes of Exercise per Session: 10 min  Stress: Stress Concern Present (03/28/2023)   [employer] of Occupational Health - Occupational Stress Questionnaire    Feeling of Stress : Rather much  Social Connections: Socially Integrated (03/28/2023)   Social Connection and Isolation Panel [NHANES]    Frequency of Communication with Friends and Family: More than three times a week    Frequency of Social Gatherings with Friends and Family: Twice a week    Attends Religious Services: More than 4 times per year    Active Member of [employer] or Organizations: Yes    Attends Engineer, structural:  More than 4 times per year    Marital Status: Married     Review of Systems  Constitutional:  Negative for appetite change and unexpected weight change.  HENT:  Negative for congestion and sinus pressure.   Respiratory:  Negative for cough, chest tightness and shortness of breath.   Cardiovascular:  Negative for chest pain, palpitations and leg swelling.  Gastrointestinal:  Negative for abdominal pain, diarrhea, nausea and vomiting.  Genitourinary:  Negative for difficulty urinating and dysuria.  Musculoskeletal:  Negative for joint swelling and myalgias.  Skin:  Negative for color change and rash.  Neurological:  Negative for dizziness and headaches.  Psychiatric/Behavioral:  Negative for agitation and dysphoric mood.        Increased stress as outlined.        Objective:     BP 128/72   Pulse 72   Temp 97.9 F (36.6 C)   Resp 16   Ht 5\' 5"  (1.651 m)   Wt 203 lb 3.2 oz (92.2 kg)   SpO2 98%   BMI 33.81 kg/m  Wt Readings from Last 3 Encounters:  02/04/24 203 lb 3.2 oz (92.2 kg)  10/29/23 203 lb 8 oz (92.3 kg)  10/07/23 208 lb (94.3 kg)    Physical Exam Vitals reviewed.  Constitutional:      General: She is not in acute distress.    Appearance: Normal appearance.  HENT:     Head: Normocephalic and atraumatic.     Right Ear: External ear normal.     Left Ear: External ear normal.     Mouth/Throat:     Pharynx: No oropharyngeal exudate or posterior oropharyngeal erythema.  Eyes:     General: No scleral icterus.       Right eye: No discharge.        Left eye: No discharge.     Conjunctiva/sclera: Conjunctivae normal.  Neck:     Thyroid: No thyromegaly.  Cardiovascular:     Rate and Rhythm: Normal rate and regular rhythm.  Pulmonary:     Effort: No respiratory distress.     Breath sounds: Normal breath sounds. No wheezing.  Abdominal:     General: Bowel sounds are normal.     Palpations: Abdomen is soft.     Tenderness: There is no abdominal tenderness.   Musculoskeletal:        General: No swelling or tenderness.     Cervical back: Neck supple. No tenderness.  Lymphadenopathy:     Cervical: No cervical adenopathy.  Skin:    Findings: No erythema or rash.  Neurological:     Mental Status: She is alert.  Psychiatric:        Mood and Affect: Mood normal.        Behavior: Behavior normal.         Outpatient Encounter Medications as of 02/04/2024  Medication Sig   ALPRAZolam (XANAX) 0.25 MG tablet Take 1 tablet (0.25 mg total) by mouth daily as needed for anxiety.   Azelastine HCl 137 MCG/SPRAY SOLN SMARTSIG:2 Both Nares Twice Daily   cholecalciferol (VITAMIN D) 25 MCG (1000 UNIT) tablet Take 1,000 Units by mouth daily.   Ferrous Sulfate (IRON) 325 (65 Fe) MG TABS Take 325 mg by mouth daily.   fexofenadine (ALLEGRA) 180 MG tablet Take 1 tablet (180 mg total) by mouth daily.   fluticasone (FLONASE) 50 MCG/ACT nasal spray SPRAY 2 SPRAYS INTO EACH NOSTRIL EVERY DAY   gabapentin (NEURONTIN) 300 MG capsule Take 300 mg by mouth 2 (two) times daily.   lidocaine (LIDODERM) 5 % SMARTSIG:2 Patch(s) T-DERMAL Daily   losartan-hydrochlorothiazide (HYZAAR) 50-12.5 MG tablet TAKE 1 TABLET BY MOUTH EVERY DAY   metFORMIN (GLUCOPHAGE) 500 MG tablet TAKE 1 TABLET (500 MG TOTAL) BY MOUTH DAILY.   Multiple Vitamin (MULTIVITAMIN WITH MINERALS) TABS tablet Take 1 tablet by mouth daily.   OneTouch Delica Lancets 30G MISC Use to check blood sugar x per day. DX E11.9.   ONETOUCH VERIO test strip USE TO CHECK BLOOD SUGAR TWICE A DAY   sertraline (ZOLOFT) 100 MG tablet TAKE 1/2 TABLET BY MOUTH (50 MG) BY MOUTH DAILY   simvastatin (  ZOCOR) 10 MG tablet TAKE 1/2 TABLET (5 MG TOTAL) BY MOUTH AT BEDTIME.   traMADol (ULTRAM) 50 MG tablet Take 1-2 tablets as needed for severe pain. Not to exceed two doses (4 tablets, 200 mg) daily   traZODone (DESYREL) 100 MG tablet TAKE 1 TABLET BY MOUTH EVERYDAY AT BEDTIME   [DISCONTINUED] Semaglutide, 1 MG/DOSE, 4 MG/3ML SOPN  Inject 1 mg as directed once a week.   No facility-administered encounter medications on file as of 02/04/2024.     Lab Results  Component Value Date   WBC 4.1 06/28/2023   HGB 12.7 06/28/2023   HCT 38.7 06/28/2023   PLT 186.0 06/28/2023   GLUCOSE 108 (H) 01/30/2024   CHOL 145 01/30/2024   TRIG 83.0 01/30/2024   HDL 66.60 01/30/2024   LDLDIRECT 146.5 09/19/2012   LDLCALC 62 01/30/2024   ALT 18 01/30/2024   AST 23 01/30/2024   NA 142 01/30/2024   K 3.7 01/30/2024   CL 104 01/30/2024   CREATININE 0.79 01/30/2024   BUN 20 01/30/2024   CO2 30 01/30/2024   TSH 1.62 03/25/2023   INR 1.0 08/21/2019   HGBA1C 5.8 01/30/2024   MICROALBUR 0.7 06/28/2023    MM 3D SCREENING MAMMOGRAM BILATERAL BREAST Result Date: 11/25/2023 CLINICAL DATA:  Screening. EXAM: DIGITAL SCREENING BILATERAL MAMMOGRAM WITH TOMOSYNTHESIS AND CAD TECHNIQUE: Bilateral screening digital craniocaudal and mediolateral oblique mammograms were obtained. Bilateral screening digital breast tomosynthesis was performed. The images were evaluated with computer-aided detection. COMPARISON:  Previous exam(s). ACR Breast Density Category a: The breasts are almost entirely fatty. FINDINGS: There are no findings suspicious for malignancy. IMPRESSION: No mammographic evidence of malignancy. A result letter of this screening mammogram will be mailed directly to the patient. RECOMMENDATION: Screening mammogram in one year. (Code:[id number]-B-01Y) BI-RADS CATEGORY  1: Negative. Electronically Signed   By: Frederico Hamman M.D.   On: 11/25/2023 10:16       Assessment & Plan:  Trigger ring finger of left hand Assessment & Plan:  Is s/p trigger finger release 10/21/23 - Dr Rosita Kea.   Status post bariatric surgery Assessment & Plan: Saw GI 05/24/23.  Dysphagia secondary to tight LAGB. Resolved after fluid removed. Doing well.    Primary hypertension Assessment & Plan: Blood pressure as outlined.  Continue losartan/hctz.  Follow pressures.   Follow metabolic panel.     Hypercholesterolemia Assessment & Plan: Continue simvastatin.  Low cholesterol diet and exercise.  Follow lipid panel and liver function tests.     Gastroesophageal reflux disease, unspecified whether esophagitis present Assessment & Plan: No acid reflux reported.    Essential (primary) hypertension Assessment & Plan: Blood pressure as outlined.  Continue losartan/hctz.  Follow pressures.  Follow metabolic panel.    Diabetes mellitus type 2, diet-controlled (HCC) Assessment & Plan: Low carb diet and exercise.  Follow met b and a1c.   Desires to stay GLP 1 agonist as outlined.  Saw GI 05/24/23.  Dysphagia secondary to tight LAGB. Resolved after fluid removed. Continue diet and exercise as tolerated. Follow met b and A1c.  Lab Results  Component Value Date   HGBA1C 5.8 01/30/2024     Depression, major, single episode, mild (HCC) Assessment & Plan: Continue on zoloft.  Increased stress as outlined.  Discussed.  Does not feel needs any further intervention. Follow.    Aortic atherosclerosis (HCC) Assessment & Plan: Continue simvastatin.       Dale [place], MD

## 2024-02-09 ENCOUNTER — Other Ambulatory Visit: Payer: Self-pay | Admitting: Internal Medicine

## 2024-02-09 ENCOUNTER — Encounter: Payer: Self-pay | Admitting: Internal Medicine

## 2024-02-09 NOTE — Assessment & Plan Note (Signed)
 Blood pressure as outlined.  Continue losartan/hctz.  Follow pressures.  Follow metabolic panel.

## 2024-02-09 NOTE — Assessment & Plan Note (Signed)
Continue simvastatin.  Low cholesterol diet and exercise.  Follow lipid panel and liver function tests.   

## 2024-02-09 NOTE — Assessment & Plan Note (Signed)
No acid reflux reported.

## 2024-02-09 NOTE — Assessment & Plan Note (Signed)
 Is s/p trigger finger release 10/21/23 - Dr Rosita Kea.

## 2024-02-09 NOTE — Assessment & Plan Note (Signed)
Continue on zoloft.  Increased stress as outlined.  Discussed.  Does not feel needs any further intervention. Follow.

## 2024-02-09 NOTE — Assessment & Plan Note (Signed)
 Saw GI 05/24/23.  Dysphagia secondary to tight LAGB. Resolved after fluid removed. Doing well.

## 2024-02-09 NOTE — Assessment & Plan Note (Signed)
 Low carb diet and exercise.  Follow met b and a1c.   Desires to stay GLP 1 agonist as outlined.  Saw GI 05/24/23.  Dysphagia secondary to tight LAGB. Resolved after fluid removed. Continue diet and exercise as tolerated. Follow met b and A1c.  Lab Results  Component Value Date   HGBA1C 5.8 01/30/2024

## 2024-02-09 NOTE — Assessment & Plan Note (Signed)
 Continue simvastatin.

## 2024-02-13 ENCOUNTER — Encounter: Payer: Self-pay | Admitting: Internal Medicine

## 2024-02-17 NOTE — Telephone Encounter (Signed)
 Erin Camacho, see me about this before calling. Reviewed her message. Please call and see how [name] is doing. (Cannot see the information from [place] - to date). Please see if can find out what information they need.

## 2024-02-19 NOTE — Telephone Encounter (Signed)
 LMTCB.  FYI- pulmonary note is in husbands chart

## 2024-02-19 NOTE — Telephone Encounter (Signed)
See below,

## 2024-02-19 NOTE — Telephone Encounter (Signed)
 I do not see the office note - to date. There is a note showing that a CT and PFTs have been ordered. Per Ms Boer message, she was wanting some specific information regarding the discussion with pulmonary. It appears you have left a message to get more information. Need to know how he is doing and if specific information she is requesting, we can call and get that information - so that I can hopefully answer any questions.

## 2024-02-21 NOTE — Telephone Encounter (Signed)
See message in husband's chart.  

## 2024-03-05 DIAGNOSIS — M5114 Intervertebral disc disorders with radiculopathy, thoracic region: Secondary | ICD-10-CM | POA: Diagnosis not present

## 2024-03-05 DIAGNOSIS — M5414 Radiculopathy, thoracic region: Secondary | ICD-10-CM | POA: Diagnosis not present

## 2024-03-20 ENCOUNTER — Ambulatory Visit (INDEPENDENT_AMBULATORY_CARE_PROVIDER_SITE_OTHER): Payer: Medicare HMO | Admitting: *Deleted

## 2024-03-20 VITALS — Ht 65.0 in | Wt 197.0 lb

## 2024-03-20 DIAGNOSIS — Z0000 Encounter for general adult medical examination without abnormal findings: Secondary | ICD-10-CM | POA: Diagnosis not present

## 2024-03-20 DIAGNOSIS — Z780 Asymptomatic menopausal state: Secondary | ICD-10-CM | POA: Diagnosis not present

## 2024-03-20 NOTE — Patient Instructions (Addendum)
 Ms. Denaro , Thank you for taking time to come for your Medicare Wellness Visit. I appreciate your ongoing commitment to your health goals. Please review the following plan we discussed and let me know if I can assist you in the future.   Recommendations:  Your Dexa /bone density has been ordered.  Please call for appointment:  You have an order for:  []   2D Mammogram  []   3D Mammogram  [x]   Bone Density     Please call for appointment:   [phone]  [facility] [address] [place], [place] [address] [phone]    Make sure to wear two-piece clothing.  No lotions, powders, or deodorants the day of the appointment. Make sure to bring picture ID and insurance card.  Bring list of medications you are currently taking including any supplements.    Managing Pain Without Opioids Opioids are strong medicines used to treat moderate to severe pain. For some people, especially those who have long-term (chronic) pain, opioids may not be the best choice for pain management due to: Side effects like nausea, constipation, and sleepiness. The risk of addiction (opioid use disorder). The longer you take opioids, the greater your risk of addiction. Pain that lasts for more than 3 months is called chronic pain. Managing chronic pain usually requires more than one approach and is often provided by a team of health care providers working together (multidisciplinary approach). Pain management may be done at a pain management center or pain clinic. How to manage pain without the use of opioids Use non-opioid medicines Non-opioid medicines for pain may include: Over-the-counter or prescription non-steroidal anti-inflammatory drugs (NSAIDs). These may be the first medicines used for pain. They work well for muscle and bone pain, and they reduce swelling. Acetaminophen . This over-the-counter medicine may work well for milder pain but not  swelling. Antidepressants. These may be used to treat chronic pain. A certain type of antidepressant (tricyclics) is often used. These medicines are given in lower doses for pain than when used for depression. Anticonvulsants. These are usually used to treat seizures but may also reduce nerve (neuropathic) pain. Muscle relaxants. These relieve pain caused by sudden muscle tightening (spasms). You may also use a pain medicine that is applied to the skin as a patch, cream, or gel (topical analgesic), such as a numbing medicine. These may cause fewer side effects than medicines taken by mouth. Do certain therapies as directed Some therapies can help with pain management. They include: Physical therapy. You will do exercises to gain strength and flexibility. A physical therapist may teach you exercises to move and stretch parts of your body that are weak, stiff, or painful. You can learn these exercises at physical therapy visits and practice them at home. Physical therapy may also involve: Massage. Heat wraps or applying heat or cold to affected areas. Electrical signals that interrupt pain signals (transcutaneous electrical nerve stimulation, TENS). Weak lasers that reduce pain and swelling (low-level laser therapy). Signals from your body that help you learn to regulate pain (biofeedback). Occupational therapy. This helps you to learn ways to function at home and work with less pain. Recreational therapy. This involves trying new activities or hobbies, such as a physical activity or drawing. Mental health therapy, including: Cognitive behavioral therapy (CBT). This helps you learn coping skills for dealing with pain. Acceptance and commitment therapy (ACT) to change the way you think and react to pain. Relaxation therapies, including muscle relaxation exercises and  mindfulness-based stress reduction. Pain management counseling. This may be individual, family, or group counseling.  Receive medical  treatments Medical treatments for pain management include: Nerve block injections. These may include a pain blocker and anti-inflammatory medicines. You may have injections: Near the spine to relieve chronic back or neck pain. Into joints to relieve back or joint pain. Into nerve areas that supply a painful area to relieve body pain. Into muscles (trigger point injections) to relieve some painful muscle conditions. A medical device placed near your spine to help block pain signals and relieve nerve pain or chronic back pain (spinal cord stimulation device). Acupuncture. Follow these instructions at home Medicines Take over-the-counter and prescription medicines only as told by your health care provider. If you are taking pain medicine, ask your health care providers about possible side effects to watch out for. Do not drive or use heavy machinery while taking prescription opioid pain medicine. Lifestyle  Do not use drugs or alcohol  to reduce pain. If you drink alcohol , limit how much you have to: 0-1 drink a day for women who are not pregnant. 0-2 drinks a day for men. Know how much alcohol  is in a drink. In the [place] one drink equals one 12 oz bottle of beer (355 mL), one 5 oz glass of wine (148 mL), or one 1 oz glass of hard liquor (44 mL). Do not use any products that contain nicotine or tobacco. These products include cigarettes, chewing tobacco, and vaping devices, such as e-cigarettes. If you need help quitting, ask your health care provider. Eat a healthy diet and maintain a healthy weight. Poor diet and excess weight may make pain worse. Eat foods that are high in fiber. These include fresh fruits and vegetables, whole grains, and beans. Limit foods that are high in fat and processed sugars, such as fried and sweet foods. Exercise regularly. Exercise lowers stress and may help relieve pain. Ask your health care provider what activities and exercises are safe for you. If your health  care provider approves, join an exercise class that combines movement and stress reduction. Examples include yoga and tai chi. Get enough sleep. Lack of sleep may make pain worse. Lower stress as much as possible. Practice stress reduction techniques as told by your therapist. General instructions Work with all your pain management providers to find the treatments that work best for you. You are an important member of your pain management team. There are many things you can do to reduce pain on your own. Consider joining an online or in-person support group for people who have chronic pain. Keep all follow-up visits. This is important. Where to find more information You can find more information about managing pain without opioids from: American Academy of Pain Medicine: painmed.org Institute for Chronic Pain: instituteforchronicpain.org American Chronic Pain Association: theacpa.org Contact a health care provider if: You have side effects from pain medicine. Your pain gets worse or does not get better with treatments or home therapy. You are struggling with anxiety or depression. Summary Many types of pain can be managed without opioids. Chronic pain may respond better to pain management without opioids. Pain is best managed when you and a team of health care providers work together. Pain management without opioids may include non-opioid medicines, medical treatments, physical therapy, mental health therapy, and lifestyle changes. Tell your health care providers if your pain gets worse or is not being managed well enough. This information is not intended to replace advice given to you by your  health care provider. Make sure you discuss any questions you have with your health care provider. Document Revised: 02/15/2021 Document Reviewed: 02/15/2021 Elsevier Patient Education  2024 [employer]  This is a list of the screening recommended for you and due dates:  Health Maintenance  Topic  Date Due   COVID-19 Vaccine (8 - 2024-25 season) 07/21/2023   Flu Shot  06/19/2024   Yearly kidney health urinalysis for diabetes  06/27/2024   Hemoglobin A1C  08/01/2024   Mammogram  11/18/2024   Eye exam for diabetics  12/04/2024   Yearly kidney function blood test for diabetes  01/29/2025   Complete foot exam   02/03/2025   Medicare Annual Wellness Visit  03/20/2025   DTaP/Tdap/Td vaccine (3 - Td or Tdap) 04/19/2029   Pneumonia Vaccine  Completed   DEXA scan (bone density measurement)  Completed   Hepatitis C Screening  Completed   Zoster (Shingles) Vaccine  Completed   HPV Vaccine  Aged Out   Meningitis B Vaccine  Aged Out   Colon Cancer Screening  Discontinued    Advanced directives: (In Chart) A copy of your advanced directives are scanned into your chart should your provider ever need it.  Next Medicare Annual Wellness Visit scheduled for next year: Yes 03/24/25 @ 9:30  Have you seen your provider in the last 6 months (3 months if uncontrolled diabetes)? Yes3/18/25

## 2024-03-20 NOTE — Progress Notes (Signed)
 Subjective:   Erin Camacho is a 78 y.o. who presents for a Medicare Wellness preventive visit.  Visit Complete: Virtual I connected with  Ladoris Piedra on 03/20/24 by a audio enabled telemedicine application and verified that I am speaking with the correct person using two identifiers.  Patient Location: Home  Provider Location: Home Office  I discussed the limitations of evaluation and management by telemedicine. The patient expressed understanding and agreed to proceed.  Vital Signs: Because this visit was a virtual/telehealth visit, some criteria may be missing or patient reported. Any vitals not documented were not able to be obtained and vitals that have been documented are patient reported.  VideoDeclined- This patient declined Librarian, academic. Therefore the visit was completed with audio only.  Persons Participating in Visit: Patient.  AWV Questionnaire: No: Patient Medicare AWV questionnaire was not completed prior to this visit.  Cardiac Risk Factors include: advanced age (>59men, >68 women);diabetes mellitus;dyslipidemia;hypertension;obesity (BMI >30kg/m2)     Objective:    Today's Vitals   03/20/24 0936 03/20/24 0937  Weight: 197 lb (89.4 kg)   Height: 5\' 5"  (1.651 m)   PainSc:  8    Body mass index is 32.78 kg/m.     03/20/2024    9:52 AM 03/18/2023   12:59 PM 07/30/2022   10:35 AM 06/20/2022    9:04 PM 06/20/2022    3:01 PM 03/09/2022    1:32 PM 07/06/2021    2:03 PM  Advanced Directives  Does Patient Have a Medical Advance Directive? Yes Yes No Yes No Yes No  Type of Estate agent of [place];Living will Living will;Healthcare Power of Attorney  Living will  Healthcare Power of [place];Living will   Does patient want to make changes to medical advance directive? No - Patient declined No - Patient declined    No - Patient declined   Copy of Healthcare Power of Attorney in Chart? Yes - validated most recent copy  scanned in chart (See row information) Yes - validated most recent copy scanned in chart (See row information)    Yes - validated most recent copy scanned in chart (See row information)   Would patient like information on creating a medical advance directive?   No - Patient declined    No - Patient declined    Current Medications (verified) Outpatient Encounter Medications as of 03/20/2024  Medication Sig   ALPRAZolam  (XANAX ) 0.25 MG tablet Take 1 tablet (0.25 mg total) by mouth daily as needed for anxiety.   Azelastine  HCl 137 MCG/SPRAY SOLN SMARTSIG:2 Both Nares Twice Daily   cholecalciferol  (VITAMIN D ) 25 MCG (1000 UNIT) tablet Take 1,000 Units by mouth daily.   Ferrous Sulfate  (IRON) 325 (65 Fe) MG TABS Take 325 mg by mouth daily.   fexofenadine  (ALLEGRA ) 180 MG tablet Take 1 tablet (180 mg total) by mouth daily.   fluticasone  (FLONASE ) 50 MCG/ACT nasal spray SPRAY 2 SPRAYS INTO EACH NOSTRIL EVERY DAY   gabapentin  (NEURONTIN ) 300 MG capsule Take 300 mg by mouth 2 (two) times daily.   lidocaine  (LIDODERM ) 5 % SMARTSIG:2 Patch(s) T-DERMAL Daily   losartan -hydrochlorothiazide  (HYZAAR) 50-12.5 MG tablet TAKE 1 TABLET BY MOUTH EVERY DAY   metFORMIN  (GLUCOPHAGE ) 500 MG tablet TAKE 1 TABLET (500 MG TOTAL) BY MOUTH DAILY.   Multiple Vitamin (MULTIVITAMIN WITH MINERALS) TABS tablet Take 1 tablet by mouth daily.   OneTouch Delica Lancets 30G MISC Use to check blood sugar x per day. DX E11.9.  ONETOUCH VERIO test strip USE TO CHECK BLOOD SUGAR TWICE A DAY   sertraline  (ZOLOFT ) 100 MG tablet TAKE 1/2 TABLET BY MOUTH (50 MG) BY MOUTH DAILY   simvastatin  (ZOCOR ) 10 MG tablet TAKE 1/2 TABLET (5 MG TOTAL) BY MOUTH AT BEDTIME.   traMADol  (ULTRAM ) 50 MG tablet Take 1-2 tablets as needed for severe pain. Not to exceed two doses (4 tablets, 200 mg) daily   traZODone  (DESYREL ) 100 MG tablet TAKE 1 TABLET BY MOUTH EVERYDAY AT BEDTIME   No facility-administered encounter medications on file as of 03/20/2024.     Allergies (verified) Adhesive [tape]   History: Past Medical History:  Diagnosis Date   Allergy    hay fever   Anxiety    Arthritis    Chronic back pain    Diabetes mellitus    Dyspnea    a. 07/2017 Echo: EF 60-65%. No rwma. Mildly dil LA. Nl RV fxn.   GERD (gastroesophageal reflux disease)    History of hiatal hernia    Hyperlipidemia    Hypertension    Osteoarthritis    Pneumonia    Spinal stenosis    Type 2 diabetes mellitus (HCC)    UTI (lower urinary tract infection)    Past Surgical History:  Procedure Laterality Date   ABDOMINAL HYSTERECTOMY  1973   ABDOMINAL HYSTERECTOMY     APPENDECTOMY     APPLICATION OF ROBOTIC ASSISTANCE FOR SPINAL PROCEDURE N/A 09/15/2018   Procedure: APPLICATION OF ROBOTIC ASSISTANCE FOR SPINAL PROCEDURE;  Surgeon: Elna Haggis, MD;  Location: [facility] OR;  Service: Neurosurgery;  Laterality: N/A;   BACK SURGERY  04/17/13, 2017, 2019   CATARACT EXTRACTION W/PHACO Left 12/16/2019   Procedure: CATARACT EXTRACTION PHACO AND INTRAOCULAR LENS PLACEMENT (IOC) LEFT PANOPTIX LENS DIABETIC 8.07 01:19.2 10.2%;  Surgeon: Annell Kidney, MD;  Location: [facility];  Service: Ophthalmology;  Laterality: Left;  Diabetic - oral meds   CATARACT EXTRACTION W/PHACO Right 01/06/2020   Procedure: CATARACT EXTRACTION PHACO AND INTRAOCULAR LENS PLACEMENT (IOC) PANOPTIX LENS RIGHT DIABETIC 10.73 01:08.6 15.7%;  Surgeon: Annell Kidney, MD;  Location: [facility];  Service: Ophthalmology;  Laterality: Right;  diabetic   CHOLECYSTECTOMY  1995   COLONOSCOPY WITH PROPOFOL  N/A 03/15/2021   Procedure: COLONOSCOPY WITH PROPOFOL ;  Surgeon: Marshall Skeeter, MD;  Location: [facility] ENDOSCOPY;  Service: Endoscopy;  Laterality: N/A;   ESOPHAGOGASTRODUODENOSCOPY N/A 06/20/2022   Procedure: ESOPHAGOGASTRODUODENOSCOPY (EGD);  Surgeon: Toledo, Alphonsus Jeans, MD;  Location: [facility] ENDOSCOPY;  Service: Gastroenterology;  Laterality: N/A;   EYE SURGERY  2021    bilateral cataract removal with lens implants   HERNIA REPAIR  08/2006   lap band  08/2003   LUMBAR LAMINECTOMY/ DECOMPRESSION WITH MET-RX Left 12/29/2020   Procedure: Left Thoracic Seven-Eight Microdiscectomy;  Surgeon: Elna Haggis, MD;  Location: [facility] OR;  Service: Neurosurgery;  Laterality: Left;   SEPTOPLASTY  04/1978   TONSILLECTOMY AND ADENOIDECTOMY  1979   TOTAL HIP ARTHROPLASTY  02/2012   Right, Dr. Aubry Blase   TOTAL HIP ARTHROPLASTY Left 08/31/2019   Procedure: TOTAL HIP ARTHROPLASTY;  Surgeon: Arlyne Lame, MD;  Location: [facility] ORS;  Service: Orthopedics;  Laterality: Left;   UPPER GI ENDOSCOPY  06/20/2022   Family History  Problem Relation Age of Onset   Hypertension Mother    Stroke Mother    Heart disease Mother    Hypertension Father    Stroke Father    Heart disease Father    Arthritis Sister    Diabetes Sister  Cancer Sister    Diabetes Brother    Hypertension Brother    Dementia Brother    Diabetes Brother    Hypertension Brother    Dementia Brother    Breast cancer Other 55   Cancer Other        breast   Diabetes Other    Kidney disease Neg Hx    Bladder Cancer Neg Hx    Social History   Socioeconomic History   Marital status: Married    Spouse name: Not on file   Number of children: Not on file   Years of education: Not on file   Highest education level: 12th grade  Occupational History   Not on file  Tobacco Use   Smoking status: Never   Smokeless tobacco: Never  Vaping Use   Vaping status: Never Used  Substance and Sexual Activity   Alcohol  use: Yes    Alcohol /week: 0.0 - 1.0 standard drinks of alcohol     Comment: RARELY   Drug use: No   Sexual activity: Not Currently  Other Topics Concern   Not on file  Social History Narrative   Lives in [place] with [place].      Work - retired Surveyor, minerals   Social Drivers of Corporate investment banker Strain: Low Risk  (03/20/2024)   Overall Financial Resource Strain (CARDIA)     Difficulty of Paying Living Expenses: Not hard at all  Food Insecurity: No Food Insecurity (03/20/2024)   Hunger Vital Sign    Worried About Running Out of Food in the Last Year: Never true    Ran Out of Food in the Last Year: Never true  Transportation Needs: No Transportation Needs (03/20/2024)   PRAPARE - Administrator, Civil Service (Medical): No    Lack of Transportation (Non-Medical): No  Physical Activity: Inactive (03/20/2024)   Exercise Vital Sign    Days of Exercise per Week: 0 days    Minutes of Exercise per Session: 0 min  Stress: Stress Concern Present (03/20/2024)   [employer] of Occupational Health - Occupational Stress Questionnaire    Feeling of Stress : To some extent  Social Connections: Socially Integrated (03/20/2024)   Social Connection and Isolation Panel [NHANES]    Frequency of Communication with Friends and Family: More than three times a week    Frequency of Social Gatherings with Friends and Family: Twice a week    Attends Religious Services: More than 4 times per year    Active Member of [employer] or Organizations: Yes    Attends Engineer, structural: More than 4 times per year    Marital Status: Married    Tobacco Counseling Counseling given: Not Answered    Clinical Intake:  Pre-visit preparation completed: Yes  Pain : 0-10 Pain Score: 8  Pain Type: Chronic pain Pain Location: Back Pain Descriptors / Indicators: Nagging Pain Onset: More than a month ago Pain Frequency: Constant     BMI - recorded: 32.78 Nutritional Status: BMI > 30  Obese Nutritional Risks: None Diabetes: Yes CBG done?: Yes (FBS 113 per patient)  Lab Results  Component Value Date   HGBA1C 5.8 01/30/2024   HGBA1C 5.7 10/02/2023   HGBA1C 5.9 06/28/2023     How often do you need to have someone help you when you read instructions, pamphlets, or other written materials from your doctor or pharmacy?: 1 - Never  Interpreter Needed?: No  Information  entered by :: R. Deberah Adolf LPN  Activities of Daily Living     03/20/2024    9:40 AM  In your present state of health, do you have any difficulty performing the following activities:  Hearing? 0  Vision? 0  Comment readers  Difficulty concentrating or making decisions? 1  Walking or climbing stairs? 1  Dressing or bathing? 0  Doing errands, shopping? 0  Preparing Food and eating ? N  Using the Toilet? N  In the past six months, have you accidently leaked urine? Y  Comment some times at night  Do you have problems with loss of bowel control? Y  Managing your Medications? N  Managing your Finances? N  Housekeeping or managing your Housekeeping? Y  Comment has help    Patient Care Team: Dellar Fenton, MD as PCP - General (Internal Medicine) Devorah Fonder, MD as PCP - Cardiology (Cardiology)  Indicate any recent Medical Services you may have received from other than [facility] providers in the past year (date may be approximate).     Assessment:   This is a routine wellness examination for [facility].  Hearing/Vision screen Hearing Screening - Comments:: No issues Vision Screening - Comments:: readers   Goals Addressed             This Visit's Progress    Patient Stated       Wants to do whatever it takes to stop having back pain       Depression Screen     03/20/2024    9:46 AM 02/04/2024    1:11 PM 10/07/2023   11:41 AM 07/05/2023   11:11 AM 04/01/2023   11:10 AM 03/18/2023   12:56 PM 11/28/2022   12:53 PM  PHQ 2/9 Scores  PHQ - 2 Score 1 2 0 2 1 0 1  PHQ- 9 Score 3 6 1 8 2  1     Fall Risk     03/20/2024    9:43 AM 03/16/2023    9:14 PM 11/28/2022   12:53 PM 08/17/2022    1:08 PM 05/17/2022   11:29 AM  Fall Risk   Falls in the past year? 0 1 0 0 1  Comment  None since last reported     Number falls in past yr: 0 1 0 0 0  Injury with Fall? 0 1 0 0 1  Comment  Sought medical care with Ortho     Risk for fall due to : No Fall Risks  Impaired mobility No Fall Risks  Impaired balance/gait;History of fall(s)  Follow up Falls prevention discussed;Falls evaluation completed Falls evaluation completed;Falls prevention discussed Falls evaluation completed Falls evaluation completed Falls evaluation completed    MEDICARE RISK AT HOME:  Medicare Risk at Home Any stairs in or around the home?: No If so, are there any without handrails?: No Home free of loose throw rugs in walkways, pet beds, electrical cords, etc?: Yes Adequate lighting in your home to reduce risk of falls?: Yes Life alert?: No Use of a cane, walker or w/c?: Yes Grab bars in the bathroom?: Yes Shower chair or bench in shower?: Yes Elevated toilet seat or a handicapped toilet?: Yes  TIMED UP AND GO:  Was the test performed?  No  Cognitive Function: 6CIT completed    11/28/2017    4:47 PM 11/28/2016    4:31 PM  MMSE - Mini Mental State Exam  Orientation to time 5 5  Orientation to Place 5 5  Registration 3 3  Attention/ Calculation 5 5  Recall  3 3  Language- name 2 objects 2 2  Language- repeat 1 1  Language- follow 3 step command 3 3  Language- read & follow direction 1 1  Write a sentence 1 1  Copy design 1 1  Total score 30 30        03/20/2024    9:52 AM 03/18/2023    1:03 PM 03/08/2021    2:38 PM 12/01/2018    4:55 PM  6CIT Screen  What Year? 0 points 0 points 0 points 0 points  What month? 0 points 0 points 0 points 0 points  What time? 0 points 0 points 0 points 0 points  Count back from 20 0 points 0 points 0 points 0 points  Months in reverse 0 points 0 points 0 points 0 points  Repeat phrase 0 points 0 points 0 points 0 points  Total Score 0 points 0 points 0 points 0 points    Immunizations Immunization History  Administered Date(s) Administered   Fluad Quad(high Dose 65+) 08/25/2019, 09/12/2020, 08/17/2022   Fluad Trivalent(High Dose 65+) 08/22/2023   Influenza Split 09/25/2012   Influenza Whole 09/07/2013   Influenza, High Dose Seasonal PF 08/13/2016,  09/02/2017, 08/22/2018, 08/25/2021   Influenza,inj,Quad PF,6+ Mos 07/27/2014, 08/25/2015   PFIZER Comirnaty(Gray Top)Covid-19 Tri-Sucrose Vaccine 12/29/2019, 01/19/2020, 04/24/2021   PFIZER(Purple Top)SARS-COV-2 Vaccination 12/29/2019, 01/19/2020, 09/02/2020   Pfizer Covid-19 Vaccine Bivalent Booster 55yrs & up 09/07/2021   Pneumococcal Conjugate-13 01/18/2014   Pneumococcal Polysaccharide-23 03/15/2012   Respiratory Syncytial Virus Vaccine,Recomb Aduvanted(Arexvy) 11/05/2022   Tdap 02/28/2012, 04/20/2019   Zoster Recombinant(Shingrix) 10/14/2019, 12/21/2019   Zoster, Live 08/27/2012    Screening Tests Health Maintenance  Topic Date Due   COVID-19 Vaccine (8 - 2024-25 season) 07/21/2023   Medicare Annual Wellness (AWV)  03/17/2024   INFLUENZA VACCINE  06/19/2024   Diabetic kidney evaluation - Urine ACR  06/27/2024   HEMOGLOBIN A1C  08/01/2024   MAMMOGRAM  11/18/2024   OPHTHALMOLOGY EXAM  12/04/2024   Diabetic kidney evaluation - eGFR measurement  01/29/2025   FOOT EXAM  02/03/2025   DTaP/Tdap/Td (3 - Td or Tdap) 04/19/2029   Pneumonia Vaccine 102+ Years old  Completed   DEXA SCAN  Completed   Hepatitis C Screening  Completed   Zoster Vaccines- Shingrix  Completed   HPV VACCINES  Aged Out   Meningococcal B Vaccine  Aged Out   Colonoscopy  Discontinued    Health Maintenance  Health Maintenance Due  Topic Date Due   COVID-19 Vaccine (8 - 2024-25 season) 07/21/2023   Medicare Annual Wellness (AWV)  03/17/2024   Health Maintenance Items Addressed: Dexa/Bone Density ordered. Patient declines covid vaccine  Additional Screening:  Vision Screening: Recommended annual ophthalmology exams for early detection of glaucoma and other disorders of the eye. Up to date [facility]  Dental Screening: Recommended annual dental exams for proper oral hygiene  Community Resource Referral / Chronic Care Management: CRR required this visit?  No   CCM required this visit?  No      Plan:     I have personally reviewed and noted the following in the patient's chart:   Medical and social history Use of alcohol , tobacco or illicit drugs  Current medications and supplements including opioid prescriptions. Patient is currently taking opioid prescriptions. Information provided to patient regarding non-opioid alternatives. Patient advised to discuss non-opioid treatment plan with their provider. Functional ability and status Nutritional status Physical activity Advanced directives List of other physicians Hospitalizations, surgeries, and ER visits  in previous 12 months Vitals Screenings to include cognitive, depression, and falls Referrals and appointments  In addition, I have reviewed and discussed with patient certain preventive protocols, quality metrics, and best practice recommendations. A written personalized care plan for preventive services as well as general preventive health recommendations were provided to patient.     Felicitas Horse, LPN   11/24/1094   After Visit Summary: (MyChart) Due to this being a telephonic visit, the after visit summary with patients personalized plan was offered to patient via MyChart   Notes: Nothing significant to report at this time.

## 2024-03-24 ENCOUNTER — Other Ambulatory Visit: Payer: Self-pay | Admitting: Neurological Surgery

## 2024-03-24 DIAGNOSIS — M5104 Intervertebral disc disorders with myelopathy, thoracic region: Secondary | ICD-10-CM

## 2024-03-24 DIAGNOSIS — Z6832 Body mass index (BMI) 32.0-32.9, adult: Secondary | ICD-10-CM | POA: Diagnosis not present

## 2024-03-29 ENCOUNTER — Encounter: Payer: Self-pay | Admitting: Internal Medicine

## 2024-03-30 NOTE — Telephone Encounter (Signed)
 Confirmed nothing acute going on. Pt and wife requested to have appointment with Dr Geralyn Knee. Appt has been scheduled and told patient to make sure he was evaluated if any acute changes. Patient and wife gave verbal understanding.

## 2024-03-31 ENCOUNTER — Ambulatory Visit
Admission: RE | Admit: 2024-03-31 | Discharge: 2024-03-31 | Disposition: A | Discharge status: Home / Self Care | Source: Ambulatory Visit | Attending: Neurological Surgery | Admitting: Neurological Surgery

## 2024-03-31 DIAGNOSIS — M5104 Intervertebral disc disorders with myelopathy, thoracic region: Secondary | ICD-10-CM

## 2024-03-31 DIAGNOSIS — M546 Pain in thoracic spine: Secondary | ICD-10-CM | POA: Diagnosis not present

## 2024-03-31 DIAGNOSIS — Z981 Arthrodesis status: Secondary | ICD-10-CM | POA: Diagnosis not present

## 2024-04-01 ENCOUNTER — Ambulatory Visit
Admission: RE | Admit: 2024-04-01 | Discharge: 2024-04-01 | Disposition: A | Discharge status: Home / Self Care | Source: Ambulatory Visit | Attending: Neurological Surgery | Admitting: Neurological Surgery

## 2024-04-01 DIAGNOSIS — Z981 Arthrodesis status: Secondary | ICD-10-CM | POA: Diagnosis not present

## 2024-04-01 DIAGNOSIS — M47816 Spondylosis without myelopathy or radiculopathy, lumbar region: Secondary | ICD-10-CM | POA: Diagnosis not present

## 2024-04-10 ENCOUNTER — Other Ambulatory Visit

## 2024-04-10 DIAGNOSIS — Z6832 Body mass index (BMI) 32.0-32.9, adult: Secondary | ICD-10-CM | POA: Diagnosis not present

## 2024-04-10 DIAGNOSIS — M5104 Intervertebral disc disorders with myelopathy, thoracic region: Secondary | ICD-10-CM | POA: Diagnosis not present

## 2024-05-22 ENCOUNTER — Encounter: Payer: Self-pay | Admitting: Internal Medicine

## 2024-05-25 NOTE — Telephone Encounter (Signed)
 Please see me before calling. We do have a list of therapist. D/w her - specific therapists and information.

## 2024-05-26 ENCOUNTER — Telehealth: Payer: Self-pay | Admitting: Internal Medicine

## 2024-05-26 DIAGNOSIS — E7800 Pure hypercholesterolemia, unspecified: Secondary | ICD-10-CM

## 2024-05-26 DIAGNOSIS — I10 Essential (primary) hypertension: Secondary | ICD-10-CM

## 2024-05-26 DIAGNOSIS — E119 Type 2 diabetes mellitus without complications: Secondary | ICD-10-CM

## 2024-05-26 NOTE — Telephone Encounter (Signed)
 Lab order needed

## 2024-05-27 NOTE — Telephone Encounter (Signed)
 Orders placed for labs

## 2024-06-03 ENCOUNTER — Other Ambulatory Visit: Payer: Self-pay | Admitting: Internal Medicine

## 2024-06-09 ENCOUNTER — Ambulatory Visit: Payer: Self-pay | Admitting: Internal Medicine

## 2024-06-09 ENCOUNTER — Other Ambulatory Visit (INDEPENDENT_AMBULATORY_CARE_PROVIDER_SITE_OTHER)

## 2024-06-09 DIAGNOSIS — E7800 Pure hypercholesterolemia, unspecified: Secondary | ICD-10-CM

## 2024-06-09 DIAGNOSIS — E119 Type 2 diabetes mellitus without complications: Secondary | ICD-10-CM

## 2024-06-09 LAB — CBC WITH DIFFERENTIAL/PLATELET
Basophils Absolute: 0 10*3/uL (ref 0.0–0.1)
Basophils Relative: 0.8 % (ref 0.0–3.0)
Eosinophils Absolute: 0.2 10*3/uL (ref 0.0–0.7)
Eosinophils Relative: 5.4 % — ABNORMAL HIGH (ref 0.0–5.0)
HCT: 36.9 % (ref 36.0–46.0)
Hemoglobin: 12.4 g/dL (ref 12.0–15.0)
Lymphocytes Relative: 30.5 % (ref 12.0–46.0)
Lymphs Abs: 1.2 10*3/uL (ref 0.7–4.0)
MCHC: 33.5 g/dL (ref 30.0–36.0)
MCV: 95.8 fl (ref 78.0–100.0)
Monocytes Absolute: 0.5 10*3/uL (ref 0.1–1.0)
Monocytes Relative: 11.6 % (ref 3.0–12.0)
Neutro Abs: 2.1 10*3/uL (ref 1.4–7.7)
Neutrophils Relative %: 51.7 % (ref 43.0–77.0)
Platelets: 179 10*3/uL (ref 150.0–400.0)
RBC: 3.85 Mil/uL — ABNORMAL LOW (ref 3.87–5.11)
RDW: 14.3 % (ref 11.5–15.5)
WBC: 4 10*3/uL (ref 4.0–10.5)

## 2024-06-09 LAB — BASIC METABOLIC PANEL WITH GFR
BUN: 27 mg/dL — ABNORMAL HIGH (ref 6–23)
CO2: 29 meq/L (ref 19–32)
Calcium: 10 mg/dL (ref 8.4–10.5)
Chloride: 104 meq/L (ref 96–112)
Creatinine, Ser: 0.76 mg/dL (ref 0.40–1.20)
GFR: 74.7 mL/min
Glucose, Bld: 108 mg/dL — ABNORMAL HIGH (ref 70–99)
Potassium: 3.8 meq/L (ref 3.5–5.1)
Sodium: 140 meq/L (ref 135–145)

## 2024-06-09 LAB — HEPATIC FUNCTION PANEL
ALT: 11 U/L (ref 0–35)
AST: 17 U/L (ref 0–37)
Albumin: 4.3 g/dL (ref 3.5–5.2)
Alkaline Phosphatase: 60 U/L (ref 39–117)
Bilirubin, Direct: 0.1 mg/dL (ref 0.0–0.3)
Total Bilirubin: 0.7 mg/dL (ref 0.2–1.2)
Total Protein: 6.6 g/dL (ref 6.0–8.3)

## 2024-06-09 LAB — LIPID PANEL
Cholesterol: 157 mg/dL (ref 0–200)
HDL: 73.7 mg/dL
LDL Cholesterol: 68 mg/dL (ref 0–99)
NonHDL: 83.03
Total CHOL/HDL Ratio: 2
Triglycerides: 77 mg/dL (ref 0.0–149.0)
VLDL: 15.4 mg/dL (ref 0.0–40.0)

## 2024-06-09 LAB — MICROALBUMIN / CREATININE URINE RATIO
Creatinine,U: 160.8 mg/dL
Microalb Creat Ratio: 12.9 mg/g (ref 0.0–30.0)
Microalb, Ur: 2.1 mg/dL — ABNORMAL HIGH (ref 0.0–1.9)

## 2024-06-09 LAB — HEMOGLOBIN A1C: Hgb A1c MFr Bld: 6.2 % (ref 4.6–6.5)

## 2024-06-09 LAB — TSH: TSH: 3.17 u[IU]/mL (ref 0.35–5.50)

## 2024-06-11 ENCOUNTER — Ambulatory Visit (INDEPENDENT_AMBULATORY_CARE_PROVIDER_SITE_OTHER): Admitting: Internal Medicine

## 2024-06-11 VITALS — BP 128/70 | HR 70 | Resp 16 | Ht 65.0 in | Wt 201.0 lb

## 2024-06-11 DIAGNOSIS — F320 Major depressive disorder, single episode, mild: Secondary | ICD-10-CM

## 2024-06-11 DIAGNOSIS — M545 Low back pain, unspecified: Secondary | ICD-10-CM

## 2024-06-11 DIAGNOSIS — I10 Essential (primary) hypertension: Secondary | ICD-10-CM

## 2024-06-11 DIAGNOSIS — E7800 Pure hypercholesterolemia, unspecified: Secondary | ICD-10-CM

## 2024-06-11 DIAGNOSIS — I700 Atherosclerosis of aorta: Secondary | ICD-10-CM | POA: Diagnosis not present

## 2024-06-11 DIAGNOSIS — E119 Type 2 diabetes mellitus without complications: Secondary | ICD-10-CM

## 2024-06-11 DIAGNOSIS — G8929 Other chronic pain: Secondary | ICD-10-CM | POA: Diagnosis not present

## 2024-06-11 DIAGNOSIS — M5441 Lumbago with sciatica, right side: Secondary | ICD-10-CM

## 2024-06-11 DIAGNOSIS — M5442 Lumbago with sciatica, left side: Secondary | ICD-10-CM | POA: Diagnosis not present

## 2024-06-11 NOTE — Progress Notes (Signed)
 Subjective:    Patient ID: Erin Camacho, female    DOB: 03/10/1946, 78 y.o.   MRN: [id number]  Patient here for  Chief Complaint  Patient presents with   Medical Management of Chronic Issues    HPI Here for a scheduled follow up - follow up regarding diabetes, hypertension and hypercholesterolemia.  Saw Dr Maree 09/17/23 - evaluation - bilateral lower extremity pain and numbness. Recommended continuing PT. Also recommended aspirin 81mg  q day - mild to moderate white matter changes. Is s/p trigger finger release 10/21/23 - Dr Kathlynn. Had f/u with cardiology 10/29/23 - stable increased stress. Stress with her husband's medical issues. Also, increased back pain. Increased pain recently trying to help with moving her husband,etc. Seeing [facility]. Planning for injection 06/25/24. Also, increased stress - sister recently passed. Discussed counseling. Information given. No chest pain reported. Breathing stable.    Past Medical History:  Diagnosis Date   Allergy    hay fever   Anxiety    Arthritis    Chronic back pain    Diabetes mellitus    Dyspnea    a. 07/2017 Echo: EF 60-65%. No rwma. Mildly dil LA. Nl RV fxn.   GERD (gastroesophageal reflux disease)    History of hiatal hernia    Hyperlipidemia    Hypertension    Osteoarthritis    Pneumonia    Spinal stenosis    Type 2 diabetes mellitus (HCC)    UTI (lower urinary tract infection)    Past Surgical History:  Procedure Laterality Date   ABDOMINAL HYSTERECTOMY  1973   ABDOMINAL HYSTERECTOMY     APPENDECTOMY     APPLICATION OF ROBOTIC ASSISTANCE FOR SPINAL PROCEDURE N/A 09/15/2018   Procedure: APPLICATION OF ROBOTIC ASSISTANCE FOR SPINAL PROCEDURE;  Surgeon: Colon Shove, MD;  Location: [facility] OR;  Service: Neurosurgery;  Laterality: N/A;   BACK SURGERY  04/17/13, 2017, 2019   CATARACT EXTRACTION W/PHACO Left 12/16/2019   Procedure: CATARACT EXTRACTION PHACO AND INTRAOCULAR LENS PLACEMENT (IOC) LEFT PANOPTIX LENS DIABETIC 8.07 01:19.2 10.2%;   Surgeon: Mittie Gaskin, MD;  Location: [facility];  Service: Ophthalmology;  Laterality: Left;  Diabetic - oral meds   CATARACT EXTRACTION W/PHACO Right 01/06/2020   Procedure: CATARACT EXTRACTION PHACO AND INTRAOCULAR LENS PLACEMENT (IOC) PANOPTIX LENS RIGHT DIABETIC 10.73 01:08.6 15.7%;  Surgeon: Mittie Gaskin, MD;  Location: [facility];  Service: Ophthalmology;  Laterality: Right;  diabetic   CHOLECYSTECTOMY  1995   COLONOSCOPY WITH PROPOFOL  N/A 03/15/2021   Procedure: COLONOSCOPY WITH PROPOFOL ;  Surgeon: Dessa Reyes ORN, MD;  Location: [facility];  Service: Endoscopy;  Laterality: N/A;   ESOPHAGOGASTRODUODENOSCOPY N/A 06/20/2022   Procedure: ESOPHAGOGASTRODUODENOSCOPY (EGD);  Surgeon: Toledo, Ladell POUR, MD;  Location: [facility];  Service: Gastroenterology;  Laterality: N/A;   EYE SURGERY  2021   bilateral cataract removal with lens implants   HERNIA REPAIR  08/2006   lap band  08/2003   LUMBAR LAMINECTOMY/ DECOMPRESSION WITH MET-RX Left 12/29/2020   Procedure: Left Thoracic Seven-Eight Microdiscectomy;  Surgeon: Colon Shove, MD;  Location: [facility] OR;  Service: Neurosurgery;  Laterality: Left;   SEPTOPLASTY  04/1978   TONSILLECTOMY AND ADENOIDECTOMY  1979   TOTAL HIP ARTHROPLASTY  02/2012   Right, Dr. Mardee   TOTAL HIP ARTHROPLASTY Left 08/31/2019   Procedure: TOTAL HIP ARTHROPLASTY;  Surgeon: Mardee Lynwood SQUIBB, MD;  Location: [facility];  Service: Orthopedics;  Laterality: Left;   UPPER GI ENDOSCOPY  06/20/2022   Family History  Problem Relation Age  of Onset   Hypertension Mother    Stroke Mother    Heart disease Mother    Hypertension Father    Stroke Father    Heart disease Father    Arthritis Sister    Diabetes Sister    Cancer Sister    Diabetes Brother    Hypertension Brother    Dementia Brother    Diabetes Brother    Hypertension Brother    Dementia Brother    Breast cancer Other 12   Cancer Other        breast   Diabetes Other     Kidney disease Neg Hx    Bladder Cancer Neg Hx    Social History   Socioeconomic History   Marital status: Married    Spouse name: Not on file   Number of children: Not on file   Years of education: Not on file   Highest education level: 12th grade  Occupational History   Not on file  Tobacco Use   Smoking status: Never   Smokeless tobacco: Never  Vaping Use   Vaping status: Never Used  Substance and Sexual Activity   Alcohol  use: Yes    Alcohol /week: 0.0 - 1.0 standard drinks of alcohol     Comment: RARELY   Drug use: No   Sexual activity: Not Currently  Other Topics Concern   Not on file  Social History Narrative   Lives in [place] with husband.      Work - retired Surveyor, minerals   Social Drivers of Corporate investment banker Strain: Low Risk  (06/09/2024)   Overall Financial Resource Strain (CARDIA)    Difficulty of Paying Living Expenses: Not very hard  Food Insecurity: No Food Insecurity (06/09/2024)   Hunger Vital Sign    Worried About Running Out of Food in the Last Year: Never true    Ran Out of Food in the Last Year: Never true  Transportation Needs: No Transportation Needs (06/09/2024)   PRAPARE - Administrator, Civil Service (Medical): No    Lack of Transportation (Non-Medical): No  Physical Activity: Inactive (06/09/2024)   Exercise Vital Sign    Days of Exercise per Week: 0 days    Minutes of Exercise per Session: Not on file  Stress: Stress Concern Present (06/09/2024)   [employer] of Occupational Health - Occupational Stress Questionnaire    Feeling of Stress: To some extent  Social Connections: Socially Integrated (06/09/2024)   Social Connection and Isolation Panel    Frequency of Communication with Friends and Family: More than three times a week    Frequency of Social Gatherings with Friends and Family: Twice a week    Attends Religious Services: More than 4 times per year    Active Member of [employer] or Organizations: Yes     Attends Engineer, structural: More than 4 times per year    Marital Status: Married     Review of Systems  Constitutional:  Negative for appetite change and unexpected weight change.  HENT:  Negative for congestion and sinus pressure.   Respiratory:  Negative for cough and chest tightness.        Breathing stable.   Cardiovascular:  Negative for chest pain and palpitations.       No increased swelling.   Gastrointestinal:  Negative for abdominal pain, constipation, diarrhea and vomiting.  Genitourinary:  Negative for difficulty urinating and dysuria.  Musculoskeletal:  Positive for back pain. Negative for joint swelling  and myalgias.  Skin:  Negative for color change and rash.  Neurological:  Negative for dizziness and headaches.  Psychiatric/Behavioral:  Negative for agitation and dysphoric mood.        Increased stress as outlined.        Objective:     BP 128/70   Pulse 70   Resp 16   Ht 5' 5 (1.651 m)   Wt 201 lb (91.2 kg)   SpO2 98%   BMI 33.45 kg/m  Wt Readings from Last 3 Encounters:  06/11/24 201 lb (91.2 kg)  03/20/24 197 lb (89.4 kg)  02/04/24 203 lb 3.2 oz (92.2 kg)    Physical Exam Vitals reviewed.  Constitutional:      General: She is not in acute distress.    Appearance: Normal appearance.  HENT:     Head: Normocephalic and atraumatic.     Right Ear: External ear normal.     Left Ear: External ear normal.     Mouth/Throat:     Pharynx: No oropharyngeal exudate or posterior oropharyngeal erythema.  Eyes:     General: No scleral icterus.       Right eye: No discharge.        Left eye: No discharge.     Conjunctiva/sclera: Conjunctivae normal.  Neck:     Thyroid : No thyromegaly.  Cardiovascular:     Rate and Rhythm: Normal rate and regular rhythm.  Pulmonary:     Effort: No respiratory distress.     Breath sounds: Normal breath sounds. No wheezing.  Abdominal:     General: Bowel sounds are normal.     Palpations: Abdomen is soft.      Tenderness: There is no abdominal tenderness.  Musculoskeletal:        General: No swelling or tenderness.     Cervical back: Neck supple. No tenderness.  Lymphadenopathy:     Cervical: No cervical adenopathy.  Skin:    Findings: No erythema or rash.  Neurological:     Mental Status: She is alert.  Psychiatric:        Mood and Affect: Mood normal.        Behavior: Behavior normal.         Outpatient Encounter Medications as of 06/11/2024  Medication Sig   ALPRAZolam  (XANAX ) 0.25 MG tablet Take 1 tablet (0.25 mg total) by mouth daily as needed for anxiety.   Azelastine  HCl 137 MCG/SPRAY SOLN SMARTSIG:2 Both Nares Twice Daily   cholecalciferol  (VITAMIN D ) 25 MCG (1000 UNIT) tablet Take 1,000 Units by mouth daily.   Ferrous Sulfate  (IRON) 325 (65 Fe) MG TABS Take 325 mg by mouth daily.   fexofenadine  (ALLEGRA ) 180 MG tablet Take 1 tablet (180 mg total) by mouth daily.   fluticasone  (FLONASE ) 50 MCG/ACT nasal spray SPRAY 2 SPRAYS INTO EACH NOSTRIL EVERY DAY   gabapentin  (NEURONTIN ) 300 MG capsule Take 300 mg by mouth 2 (two) times daily.   lidocaine  (LIDODERM ) 5 % SMARTSIG:2 Patch(s) T-DERMAL Daily   losartan -hydrochlorothiazide  (HYZAAR) 50-12.5 MG tablet TAKE 1 TABLET BY MOUTH EVERY DAY   metFORMIN  (GLUCOPHAGE ) 500 MG tablet TAKE 1 TABLET (500 MG TOTAL) BY MOUTH DAILY.   Multiple Vitamin (MULTIVITAMIN WITH MINERALS) TABS tablet Take 1 tablet by mouth daily.   OneTouch Delica Lancets 30G MISC Use to check blood sugar x per day. DX E11.9.   ONETOUCH VERIO test strip USE TO CHECK BLOOD SUGAR TWICE A DAY   sertraline  (ZOLOFT ) 100 MG tablet TAKE 1/2 TABLET BY  MOUTH (50 MG) BY MOUTH DAILY   simvastatin  (ZOCOR ) 10 MG tablet TAKE 1/2 TABLET (5 MG TOTAL) BY MOUTH AT BEDTIME.   traMADol  (ULTRAM ) 50 MG tablet Take 1-2 tablets as needed for severe pain. Not to exceed two doses (4 tablets, 200 mg) daily   traZODone  (DESYREL ) 100 MG tablet TAKE 1 TABLET BY MOUTH EVERYDAY AT BEDTIME   No  facility-administered encounter medications on file as of 06/11/2024.     Lab Results  Component Value Date   WBC 4.0 06/09/2024   HGB 12.4 06/09/2024   HCT 36.9 06/09/2024   PLT 179.0 06/09/2024   GLUCOSE 108 (H) 06/09/2024   CHOL 157 06/09/2024   TRIG 77.0 06/09/2024   HDL 73.70 06/09/2024   LDLDIRECT 146.5 09/19/2012   LDLCALC 68 06/09/2024   ALT 11 06/09/2024   AST 17 06/09/2024   NA 140 06/09/2024   K 3.8 06/09/2024   CL 104 06/09/2024   CREATININE 0.76 06/09/2024   BUN 27 (H) 06/09/2024   CO2 29 06/09/2024   TSH 3.17 06/09/2024   INR 1.0 08/21/2019   HGBA1C 6.2 06/09/2024   MICROALBUR 2.1 (H) 06/09/2024    MR THORACIC SPINE WO CONTRAST Result Date: 04/23/2024 CLINICAL DATA:  Right mid back pain for years. Previous thoracolumbar fusion. Intervertebral thoracic disc disorder with myelopathy. EXAM: MRI THORACIC SPINE WITHOUT CONTRAST TECHNIQUE: Multiplanar, multisequence MR imaging of the thoracic spine was performed. No intravenous contrast was administered. COMPARISON:  CT thoracic spine 03/31/2024 and 06/08/2022. MRI of the thoracic spine 06/12/2022. FINDINGS: Alignment: Stable mild thoracolumbar scoliosis. No focal angulation or significant listhesis. Vertebrae: No acute or suspicious osseous findings. Previous thoracolumbar fusion extending inferiorly from T10. Previous spinal augmentation from T10 through L1. Cord: The lower thoracic cord is partially obscured by artifact from the thoracolumbar fusion. There is stable central linear signal within the cord at the T9 and T10 levels consistent with a chronic small syrinx or prominence of the central spinal cord canal.No new cord signal abnormalities or significant deformity identified. Paraspinal and other soft tissues: No significant paraspinal abnormalities. Disc levels: Multiple thoracic disc protrusions are again noted which are similar in appearance to the previous MRI, further detailed below. There is no high-grade spinal  stenosis or foraminal narrowing. Small right paracentral disc protrusions are present at T2-3, T3-4 and T4-5 without cord deformity or foraminal narrowing. T5-6: Moderate-sized disc extrusion in the right subarticular zone exerts stable mild mass effect on the thoracic cord. No significant foraminal narrowing. T6-7: Stable loss of disc height with annular disc bulging and posterior osteophytes. No significant cord deformity or foraminal narrowing. T7-8: Stable chronic loss of disc height with annular disc bulging and a chronic right paracentral disc protrusion. No cord deformity or significant foraminal narrowing. T8-9: Stable chronic loss of disc height with annular disc bulging and a chronic partially calcified central disc extrusion. Stable mild mass effect on the thoracic cord. No significant foraminal narrowing. T9-10: Stable chronic loss of disc height with annular disc bulging and endplate osteophytes. No significant spinal stenosis or foraminal narrowing. T10-11: The spinal canal and neural foramina remain patent status post posterior fusion. T11-12: Stable chronic loss of disc height. The spinal canal and neural foramina remain patent status post posterior fusion. T12-L1: Preserved disc height and hydration. The spinal canal and neural foramina are patent. IMPRESSION: 1. No acute findings or explanation for the patient's symptoms. 2. 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 There is stable mild mass effect on  the thoracic cord at T5-6 and T8-9. No significant foraminal narrowing demonstrated. 3. 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 No evidence of myelopathy. 4. Previous thoracolumbar fusion extending inferiorly from T10. The spinal canal and neural foramina remain patent at the operative levels. Electronically Signed   By: Elsie Perone M.D.   On: 04/23/2024 08:44       Assessment & Plan:  Aortic  atherosclerosis (HCC) Assessment & Plan: Continue simvastatin .    Diabetes mellitus type 2, diet-controlled (HCC) Assessment & Plan: Low carb diet and exercise.  Follow met b and a1c. Saw GI 05/24/23.  Dysphagia secondary to tight LAGB. Resolved after fluid removed. Continue diet and exercise as tolerated. Follow met b and A1c.  Lab Results  Component Value Date   HGBA1C 6.2 06/09/2024    Orders: -     Basic metabolic panel with GFR; Future -     Hemoglobin A1c; Future  Hypercholesterolemia Assessment & Plan: Continue simvastatin .  Low cholesterol diet and exercise.  Follow lipid panel.  Lab Results  Component Value Date   CHOL 157 06/09/2024   HDL 73.70 06/09/2024   LDLCALC 68 06/09/2024   LDLDIRECT 146.5 09/19/2012   TRIG 77.0 06/09/2024   CHOLHDL 2 06/09/2024     Orders: -     Lipid panel; Future -     Hepatic function panel; Future  Chronic low back pain (1ry area of Pain) (Bilateral) (R>L) w/o sciatica Assessment & Plan: Followed by Dr Colon. Increased pain recently. Has contacted [facility]. Planning for injeciton 06/25/24.    Depression, major, single episode, mild (HCC) Assessment & Plan: Continue on zoloft .  Increased stress as outlined.  Discussed.  Information given - regarding counseling. Follow.    Essential (primary) hypertension Assessment & Plan: Blood pressure as outlined.  Continue losartan /hctz.  Follow pressures.  Follow metabolic panel. No changes in medication today.    Primary hypertension Assessment & Plan: Blood pressure as outlined.  Continue losartan /hctz.  Follow pressures.  Follow metabolic panel.     Bilateral low back pain with bilateral sciatica, unspecified chronicity Assessment & Plan: Sees Dr Colon for her back. Increased pain as outlined. Planning for iinjection 06/25/24.       Allena Hamilton, MD

## 2024-06-14 ENCOUNTER — Encounter: Payer: Self-pay | Admitting: Internal Medicine

## 2024-06-14 NOTE — Assessment & Plan Note (Signed)
 Followed by Dr Colon. Increased pain recently. Has contacted [facility]. Planning for injeciton 06/25/24.

## 2024-06-14 NOTE — Assessment & Plan Note (Signed)
 Low carb diet and exercise.  Follow met b and a1c. Saw GI 05/24/23.  Dysphagia secondary to tight LAGB. Resolved after fluid removed. Continue diet and exercise as tolerated. Follow met b and A1c.  Lab Results  Component Value Date   HGBA1C 6.2 06/09/2024

## 2024-06-14 NOTE — Assessment & Plan Note (Signed)
 Continue on zoloft .  Increased stress as outlined.  Discussed.  Information given - regarding counseling. Follow.

## 2024-06-14 NOTE — Assessment & Plan Note (Signed)
 Continue simvastatin .  Low cholesterol diet and exercise.  Follow lipid panel.  Lab Results  Component Value Date   CHOL 157 06/09/2024   HDL 73.70 06/09/2024   LDLCALC 68 06/09/2024   LDLDIRECT 146.5 09/19/2012   TRIG 77.0 06/09/2024   CHOLHDL 2 06/09/2024

## 2024-06-14 NOTE — Assessment & Plan Note (Signed)
 Blood pressure as outlined.  Continue losartan/hctz.  Follow pressures.  Follow metabolic panel.

## 2024-06-14 NOTE — Assessment & Plan Note (Signed)
 Sees Dr Colon for her back. Increased pain as outlined. Planning for iinjection 06/25/24.

## 2024-06-14 NOTE — Assessment & Plan Note (Signed)
 Blood pressure as outlined.  Continue losartan /hctz.  Follow pressures.  Follow metabolic panel. No changes in medication today.

## 2024-06-14 NOTE — Assessment & Plan Note (Signed)
 Continue simvastatin.

## 2024-06-19 ENCOUNTER — Encounter: Payer: Self-pay | Admitting: Internal Medicine

## 2024-06-21 ENCOUNTER — Ambulatory Visit
Admission: EM | Admit: 2024-06-21 | Discharge: 2024-06-21 | Disposition: A | Discharge status: Home / Self Care | Attending: Emergency Medicine | Admitting: Emergency Medicine

## 2024-06-21 ENCOUNTER — Encounter: Payer: Self-pay | Admitting: Emergency Medicine

## 2024-06-21 DIAGNOSIS — T148XXA Other injury of unspecified body region, initial encounter: Secondary | ICD-10-CM | POA: Diagnosis not present

## 2024-06-21 DIAGNOSIS — L089 Local infection of the skin and subcutaneous tissue, unspecified: Secondary | ICD-10-CM | POA: Diagnosis not present

## 2024-06-21 MED ORDER — AMOXICILLIN-POT CLAVULANATE 875-125 MG PO TABS: 1.0000 | ORAL_TABLET | Freq: Two times a day (BID) | ORAL | 0 refills | Status: DC

## 2024-06-21 MED ORDER — DOXYCYCLINE HYCLATE 100 MG PO CAPS: 100.0000 mg | ORAL_CAPSULE | Freq: Two times a day (BID) | ORAL | 0 refills | Status: AC

## 2024-06-21 NOTE — Discharge Instructions (Addendum)
 Finish the Augmentin  and doxycycline , even if you feel better.  Keep a clean with soap and water.  May continue antibiotic ointment.  Please follow-up with your primary care provider if it is not getting better, or if it gets worse.  Go to the ER if you start having fevers above 100.4, leg swelling, pain, or for other concerns.

## 2024-06-21 NOTE — ED Provider Notes (Signed)
 HPI  SUBJECTIVE:  Erin Camacho is a 78 y.o. female who presents with erythema that is getting larger around 2 areas where she was scratched by her dog in her right lower extremity 4 days ago.  No pain, purulent drainage, localized swelling, body aches, fevers.  She has been applying triple antibiotic ointment, wound cleanser and Mercurochrome without improvement in her symptoms.  No aggravating factors. She has a past medical history of diabetes, GERD, hyperlipidemia, hypertension, chronic back pain.  No history of MRSA, chronic kidney disease.  Per chart review she had tetanus updated 04/2019.  PCP: [facility].  Past Medical History:  Diagnosis Date   Allergy    hay fever   Anxiety    Arthritis    Chronic back pain    Diabetes mellitus    Dyspnea    a. 07/2017 Echo: EF 60-65%. No rwma. Mildly dil LA. Nl RV fxn.   GERD (gastroesophageal reflux disease)    History of hiatal hernia    Hyperlipidemia    Hypertension    Osteoarthritis    Pneumonia    Spinal stenosis    Type 2 diabetes mellitus (HCC)    UTI (lower urinary tract infection)     Past Surgical History:  Procedure Laterality Date   ABDOMINAL HYSTERECTOMY  1973   ABDOMINAL HYSTERECTOMY     APPENDECTOMY     APPLICATION OF ROBOTIC ASSISTANCE FOR SPINAL PROCEDURE N/A 09/15/2018   Procedure: APPLICATION OF ROBOTIC ASSISTANCE FOR SPINAL PROCEDURE;  Surgeon: Colon Shove, MD;  Location: [facility];  Service: Neurosurgery;  Laterality: N/A;   BACK SURGERY  04/17/13, 2017, 2019   CATARACT EXTRACTION W/PHACO Left 12/16/2019   Procedure: CATARACT EXTRACTION PHACO AND INTRAOCULAR LENS PLACEMENT (IOC) LEFT PANOPTIX LENS DIABETIC 8.07 01:19.2 10.2%;  Surgeon: Mittie Gaskin, MD;  Location: [facility];  Service: Ophthalmology;  Laterality: Left;  Diabetic - oral meds   CATARACT EXTRACTION W/PHACO Right 01/06/2020   Procedure: CATARACT EXTRACTION PHACO AND INTRAOCULAR LENS PLACEMENT (IOC) PANOPTIX LENS RIGHT DIABETIC  10.73 01:08.6 15.7%;  Surgeon: Mittie Gaskin, MD;  Location: [facility];  Service: Ophthalmology;  Laterality: Right;  diabetic   CHOLECYSTECTOMY  1995   COLONOSCOPY WITH PROPOFOL  N/A 03/15/2021   Procedure: COLONOSCOPY WITH PROPOFOL ;  Surgeon: Dessa Reyes ORN, MD;  Location: [facility] END[facility];  Service: Endoscopy;  Laterality: N/A;   ESOPHAGOGASTRODUODENOSCOPY N/A 06/20/2022   Procedure: ESOPHAGOGASTRODUODENOSCOPY (EGD);  Surgeon: Toledo, Ladell POUR, MD;  Location: [facility] END[facility];  Service: Gastroenterology;  Laterality: N/A;   EYE SURGERY  2021   bilateral cataract removal with lens implants   HERNIA REPAIR  08/2006   lap band  08/2003   LUMBAR LAMINECTOMY/ DECOMPRESSION WITH MET-RX Left 12/29/2020   Procedure: Left Thoracic Seven-Eight Microdiscectomy;  Surgeon: Colon Shove, MD;  Location: [facility] OR;  Service: Neurosurgery;  Laterality: Left;   SEPTOPLASTY  04/1978   TONSILLECTOMY AND ADENOIDECTOMY  1979   TOTAL HIP ARTHROPLASTY  02/2012   Right, Dr. Mardee   TOTAL HIP ARTHROPLASTY Left 08/31/2019   Procedure: TOTAL HIP ARTHROPLASTY;  Surgeon: Mardee Lynwood SQUIBB, MD;  Location: [facility] ORS;  Service: Orthopedics;  Laterality: Left;   UPPER GI ENDOSCOPY  06/20/2022    Family History  Problem Relation Age of Onset   Hypertension Mother    Stroke Mother    Heart disease Mother    Hypertension Father    Stroke Father    Heart disease Father    Arthritis Sister    Diabetes Sister  Cancer Sister    Diabetes Brother    Hypertension Brother    Dementia Brother    Diabetes Brother    Hypertension Brother    Dementia Brother    Breast cancer Other 44   Cancer Other        breast   Diabetes Other    Kidney disease Neg Hx    Bladder Cancer Neg Hx     Social History   Tobacco Use   Smoking status: Never   Smokeless tobacco: Never  Vaping Use   Vaping status: Never Used  Substance Use Topics   Alcohol  use: Yes    Alcohol /week: 0.0 - 1.0 standard drinks of alcohol      Comment: RARELY   Drug use: No    No current facility-administered medications for this encounter.  Current Outpatient Medications:    ALPRAZolam  (XANAX ) 0.25 MG tablet, Take 1 tablet (0.25 mg total) by mouth daily as needed for anxiety., Disp: 20 tablet, Rfl: 0   amoxicillin -clavulanate (AUGMENTIN ) 875-125 MG tablet, Take 1 tablet by mouth every 12 (twelve) hours., Disp: 14 tablet, Rfl: 0   cholecalciferol  (VITAMIN D ) 25 MCG (1000 UNIT) tablet, Take 1,000 Units by mouth daily., Disp: , Rfl:    doxycycline  (VIBRAMYCIN ) 100 MG capsule, Take 1 capsule (100 mg total) by mouth 2 (two) times daily for 7 days., Disp: 14 capsule, Rfl: 0   Ferrous Sulfate  (IRON) 325 (65 Fe) MG TABS, Take 325 mg by mouth daily., Disp: , Rfl:    fexofenadine  (ALLEGRA ) 180 MG tablet, Take 1 tablet (180 mg total) by mouth daily., Disp: 90 tablet, Rfl: 4   gabapentin  (NEURONTIN ) 300 MG capsule, Take 300 mg by mouth 2 (two) times daily., Disp: , Rfl:    losartan -hydrochlorothiazide  (HYZAAR) 50-12.5 MG tablet, TAKE 1 TABLET BY MOUTH EVERY DAY, Disp: 90 tablet, Rfl: 2   metFORMIN  (GLUCOPHAGE ) 500 MG tablet, TAKE 1 TABLET (500 MG TOTAL) BY MOUTH DAILY., Disp: 90 tablet, Rfl: 0   Multiple Vitamin (MULTIVITAMIN WITH MINERALS) TABS tablet, Take 1 tablet by mouth daily., Disp: , Rfl:    sertraline  (ZOLOFT ) 100 MG tablet, TAKE 1/2 TABLET BY MOUTH (50 MG) BY MOUTH DAILY, Disp: 45 tablet, Rfl: 0   simvastatin  (ZOCOR ) 10 MG tablet, TAKE 1/2 TABLET (5 MG TOTAL) BY MOUTH AT BEDTIME., Disp: 45 tablet, Rfl: 3   traMADol  (ULTRAM ) 50 MG tablet, Take 1-2 tablets as needed for severe pain. Not to exceed two doses (4 tablets, 200 mg) daily, Disp: 15 tablet, Rfl: 0   traZODone  (DESYREL ) 100 MG tablet, TAKE 1 TABLET BY MOUTH EVERYDAY AT BEDTIME, Disp: 90 tablet, Rfl: 0   Azelastine  HCl 137 MCG/SPRAY SOLN, SMARTSIG:2 Both Nares Twice Daily, Disp: , Rfl:    fluticasone  (FLONASE ) 50 MCG/ACT nasal spray, SPRAY 2 SPRAYS INTO EACH NOSTRIL EVERY DAY,  Disp: 48 mL, Rfl: 1   lidocaine  (LIDODERM ) 5 %, SMARTSIG:2 Patch(s) T-DERMAL Daily, Disp: , Rfl:    OneTouch Delica Lancets 30G MISC, Use to check blood sugar x per day. DX E11.9., Disp: 100 each, Rfl: 3   ONETOUCH VERIO test strip, USE TO CHECK BLOOD SUGAR TWICE A DAY, Disp: 100 strip, Rfl: 12  Allergies  Allergen Reactions   Adhesive [Tape] Itching    Honeycomb bandage - redness/itching     ROS  As noted in HPI.   Physical Exam  BP (!) 154/83 (BP Location: Right Arm)   Pulse (!) 57   Temp 98.7 F (37.1 C) (Oral)   Resp 16  Ht 5' 5 (1.651 m)   Wt 91.7 kg   BMI 33.65 kg/m   Constitutional: Well developed, well nourished, no acute distress Eyes:  EOMI, conjunctiva normal bilaterally HENT: Normocephalic, atraumatic,mucus membranes moist Respiratory: Normal inspiratory effort Cardiovascular: Normal rate GI: nondistended skin:  3 skin tears with surrounding nontender erythema anterior right lower extremity.     2 abrasions medial distal right lower extremity with surrounding nontender erythema.  No induration, increased temperature, or expressible purulent drainage either site  Musculoskeletal: See skin exam Neurologic: Alert & oriented x 3, no focal neuro deficits Psychiatric: Speech and behavior appropriate   ED Course   Medications - No data to display  No orders of the defined types were placed in this encounter.   No results found for this or any previous visit (from the past 24 hours). No results found.  ED Clinical Impression  1. Infected abrasion   2. Infected skin tear      ED Assessment/Plan     Tetanus 04/20/2019 per chart review Calculated creatinine clearance from labs done late June 07 2586 mL/min.  Patient presents with infected skin tears/abrasions after being scratched by her dog 4 days ago.  Her tetanus is up-to-date.  Will send home with Augmentin  875 mg p.o. and doxycycline  100 mg p.o. twice daily twice daily for 7 days.,  And  since this was a scratch from an organic source, and doxycycline  to cover any possible MRSA infection.  Follow-up with PCP as needed.  Discussed MDM, treatment plan, and plan for follow-up with patient.  Written ER return precautions given.  patient agrees with plan.   Meds ordered this encounter  Medications   doxycycline  (VIBRAMYCIN ) 100 MG capsule    Sig: Take 1 capsule (100 mg total) by mouth 2 (two) times daily for 7 days.    Dispense:  14 capsule    Refill:  0   amoxicillin -clavulanate (AUGMENTIN ) 875-125 MG tablet    Sig: Take 1 tablet by mouth every 12 (twelve) hours.    Dispense:  14 tablet    Refill:  0      *This clinic note was created using Scientist, clinical (histocompatibility and immunogenetics). Therefore, there may be occasional mistakes despite careful proofreading.  ?    Van Knee, MD 06/21/24 1401

## 2024-06-21 NOTE — ED Triage Notes (Signed)
 Pt states her grand dog scratched her right lower leg. She states she heard a knock at the door and jumped up and her toe nails got her leg. This occurred about 4 days ago.

## 2024-06-25 DIAGNOSIS — M5114 Intervertebral disc disorders with radiculopathy, thoracic region: Secondary | ICD-10-CM | POA: Diagnosis not present

## 2024-06-25 DIAGNOSIS — M5414 Radiculopathy, thoracic region: Secondary | ICD-10-CM | POA: Diagnosis not present

## 2024-07-14 ENCOUNTER — Other Ambulatory Visit: Payer: Self-pay | Admitting: Internal Medicine

## 2024-07-22 DIAGNOSIS — Z6832 Body mass index (BMI) 32.0-32.9, adult: Secondary | ICD-10-CM | POA: Diagnosis not present

## 2024-07-22 DIAGNOSIS — M5104 Intervertebral disc disorders with myelopathy, thoracic region: Secondary | ICD-10-CM | POA: Diagnosis not present

## 2024-08-08 ENCOUNTER — Other Ambulatory Visit: Payer: Self-pay | Admitting: Internal Medicine

## 2024-08-21 ENCOUNTER — Ambulatory Visit (INDEPENDENT_AMBULATORY_CARE_PROVIDER_SITE_OTHER)

## 2024-08-21 DIAGNOSIS — Z23 Encounter for immunization: Secondary | ICD-10-CM | POA: Diagnosis not present

## 2024-08-21 NOTE — Progress Notes (Signed)
 Flu shot (high dose) administered in L deltoid. Pt tolerated well. No concerns at this time.

## 2024-09-01 ENCOUNTER — Other Ambulatory Visit: Payer: Self-pay | Admitting: Internal Medicine

## 2024-09-01 ENCOUNTER — Other Ambulatory Visit: Payer: Self-pay

## 2024-09-01 MED ORDER — AZELASTINE HCL 137 MCG/SPRAY NA SOLN: 2.0000 | Freq: Two times a day (BID) | NASAL | 11 refills | Status: AC

## 2024-09-01 MED ORDER — FLUTICASONE PROPIONATE 50 MCG/ACT NA SUSP: 2.0000 | Freq: Every day | NASAL | 1 refills | Status: DC

## 2024-09-15 ENCOUNTER — Other Ambulatory Visit

## 2024-09-15 DIAGNOSIS — E7800 Pure hypercholesterolemia, unspecified: Secondary | ICD-10-CM | POA: Diagnosis not present

## 2024-09-15 DIAGNOSIS — E119 Type 2 diabetes mellitus without complications: Secondary | ICD-10-CM | POA: Diagnosis not present

## 2024-09-15 LAB — LIPID PANEL
Cholesterol: 158 mg/dL (ref 0–200)
HDL: 74.1 mg/dL
LDL Cholesterol: 71 mg/dL (ref 0–99)
NonHDL: 83.8
Total CHOL/HDL Ratio: 2
Triglycerides: 65 mg/dL (ref 0.0–149.0)
VLDL: 13 mg/dL (ref 0.0–40.0)

## 2024-09-15 LAB — BASIC METABOLIC PANEL WITH GFR
BUN: 23 mg/dL (ref 6–23)
CO2: 33 meq/L — ABNORMAL HIGH (ref 19–32)
Calcium: 9.8 mg/dL (ref 8.4–10.5)
Chloride: 103 meq/L (ref 96–112)
Creatinine, Ser: 0.73 mg/dL (ref 0.40–1.20)
GFR: 78.25 mL/min
Glucose, Bld: 96 mg/dL (ref 70–99)
Potassium: 4.1 meq/L (ref 3.5–5.1)
Sodium: 143 meq/L (ref 135–145)

## 2024-09-15 LAB — HEPATIC FUNCTION PANEL
ALT: 11 U/L (ref 0–35)
AST: 16 U/L (ref 0–37)
Albumin: 4.3 g/dL (ref 3.5–5.2)
Alkaline Phosphatase: 73 U/L (ref 39–117)
Bilirubin, Direct: 0.2 mg/dL (ref 0.0–0.3)
Total Bilirubin: 0.7 mg/dL (ref 0.2–1.2)
Total Protein: 6.5 g/dL (ref 6.0–8.3)

## 2024-09-15 LAB — HEMOGLOBIN A1C: Hgb A1c MFr Bld: 6 % (ref 4.6–6.5)

## 2024-09-16 ENCOUNTER — Ambulatory Visit: Payer: Self-pay | Admitting: Internal Medicine

## 2024-09-17 ENCOUNTER — Ambulatory Visit: Admitting: Internal Medicine

## 2024-09-17 ENCOUNTER — Encounter: Payer: Self-pay | Admitting: Internal Medicine

## 2024-09-17 VITALS — BP 116/78 | HR 67 | Temp 98.6°F | Ht 65.0 in | Wt 206.6 lb

## 2024-09-17 DIAGNOSIS — E119 Type 2 diabetes mellitus without complications: Secondary | ICD-10-CM

## 2024-09-17 DIAGNOSIS — E7800 Pure hypercholesterolemia, unspecified: Secondary | ICD-10-CM

## 2024-09-17 DIAGNOSIS — I10 Essential (primary) hypertension: Secondary | ICD-10-CM | POA: Diagnosis not present

## 2024-09-17 DIAGNOSIS — F320 Major depressive disorder, single episode, mild: Secondary | ICD-10-CM | POA: Diagnosis not present

## 2024-09-17 DIAGNOSIS — G8929 Other chronic pain: Secondary | ICD-10-CM

## 2024-09-17 DIAGNOSIS — M545 Low back pain, unspecified: Secondary | ICD-10-CM

## 2024-09-17 DIAGNOSIS — Z1231 Encounter for screening mammogram for malignant neoplasm of breast: Secondary | ICD-10-CM

## 2024-09-17 DIAGNOSIS — Z0000 Encounter for general adult medical examination without abnormal findings: Secondary | ICD-10-CM

## 2024-09-17 DIAGNOSIS — Z7984 Long term (current) use of oral hypoglycemic drugs: Secondary | ICD-10-CM

## 2024-09-17 MED ORDER — FLUTICASONE PROPIONATE 50 MCG/ACT NA SUSP: 2.0000 | Freq: Every day | NASAL | 1 refills | Status: AC

## 2024-09-17 MED ORDER — METFORMIN HCL 500 MG PO TABS: 500.0000 mg | ORAL_TABLET | Freq: Every day | ORAL | 1 refills | Status: AC

## 2024-09-17 MED ORDER — TRAZODONE HCL 100 MG PO TABS: ORAL_TABLET | ORAL | 0 refills | Status: AC

## 2024-09-17 MED ORDER — LOSARTAN POTASSIUM-HCTZ 50-12.5 MG PO TABS: 1.0000 | ORAL_TABLET | Freq: Every day | ORAL | 2 refills | Status: AC

## 2024-09-17 MED ORDER — SERTRALINE HCL 100 MG PO TABS: ORAL_TABLET | ORAL | 2 refills | Status: AC

## 2024-09-17 NOTE — Progress Notes (Signed)
 Subjective:    Patient ID: Erin Camacho, female    DOB: 03-Aug-1946, 78 y.o.   MRN: [id number]  Patient here for  Chief Complaint  Patient presents with   Medical Management of Chronic Issues   Annual Exam    HPI Here for a physical exam.  Had f/u with cardiology 10/29/23 - stable. Increased stress. Stress with her husband's medical issues. Discussed. Overall she feels she is handling things relatively well. Breathing stable. No chest pain. Concerned - weight gain. No abdominal pain reported.    Past Medical History:  Diagnosis Date   Allergy    hay fever   Anxiety    Arthritis    Blood transfusion without reported diagnosis September 15, 2018   Back surgery @ [facility] [place] [place].   Cataract    Have had cataracts removed from both eyes.   Chronic back pain    Diabetes mellitus    Dyspnea    a. 07/2017 Echo: EF 60-65%. No rwma. Mildly dil LA. Nl RV fxn.   GERD (gastroesophageal reflux disease)    History of hiatal hernia    Hyperlipidemia    Hypertension    Osteoarthritis    Pneumonia    Spinal stenosis    Type 2 diabetes mellitus (HCC)    UTI (lower urinary tract infection)    Past Surgical History:  Procedure Laterality Date   ABDOMINAL HYSTERECTOMY  1973   ABDOMINAL HYSTERECTOMY     APPENDECTOMY     APPLICATION OF ROBOTIC ASSISTANCE FOR SPINAL PROCEDURE N/A 09/15/2018   Procedure: APPLICATION OF ROBOTIC ASSISTANCE FOR SPINAL PROCEDURE;  Surgeon: Colon Shove, MD;  Location: [facility] OR;  Service: Neurosurgery;  Laterality: N/A;   BACK SURGERY  04/17/13, 2017, 2019   CATARACT EXTRACTION W/PHACO Left 12/16/2019   Procedure: CATARACT EXTRACTION PHACO AND INTRAOCULAR LENS PLACEMENT (IOC) LEFT PANOPTIX LENS DIABETIC 8.07 01:19.2 10.2%;  Surgeon: Mittie Gaskin, MD;  Location: [facility];  Service: Ophthalmology;  Laterality: Left;  Diabetic - oral meds   CATARACT EXTRACTION W/PHACO Right 01/06/2020   Procedure: CATARACT EXTRACTION PHACO AND INTRAOCULAR LENS PLACEMENT  (IOC) PANOPTIX LENS RIGHT DIABETIC 10.73 01:08.6 15.7%;  Surgeon: Mittie Gaskin, MD;  Location: [facility];  Service: Ophthalmology;  Laterality: Right;  diabetic   CHOLECYSTECTOMY  1995   COLONOSCOPY WITH PROPOFOL  N/A 03/15/2021   Procedure: COLONOSCOPY WITH PROPOFOL ;  Surgeon: Dessa Reyes ORN, MD;  Location: [facility];  Service: Endoscopy;  Laterality: N/A;   ESOPHAGOGASTRODUODENOSCOPY N/A 06/20/2022   Procedure: ESOPHAGOGASTRODUODENOSCOPY (EGD);  Surgeon: Toledo, Ladell POUR, MD;  Location: [facility];  Service: Gastroenterology;  Laterality: N/A;   EYE SURGERY  2021   bilateral cataract removal with lens implants   HERNIA REPAIR  08/2006   JOINT REPLACEMENT     Both hips have been replaced   lap band  08/2003   LUMBAR LAMINECTOMY/ DECOMPRESSION WITH MET-RX Left 12/29/2020   Procedure: Left Thoracic Seven-Eight Microdiscectomy;  Surgeon: Colon Shove, MD;  Location: [facility] OR;  Service: Neurosurgery;  Laterality: Left;   SEPTOPLASTY  04/1978   SPINE SURGERY     Have had 3 surgeries for fusion & 2 ruptured discs   TONSILLECTOMY AND ADENOIDECTOMY  1979   TOTAL HIP ARTHROPLASTY  02/2012   Right, Dr. Mardee   TOTAL HIP ARTHROPLASTY Left 08/31/2019   Procedure: TOTAL HIP ARTHROPLASTY;  Surgeon: Mardee Lynwood SQUIBB, MD;  Location: [facility] OR[facility];  Service: Orthopedics;  Laterality: Left;   UPPER GI ENDOSCOPY  06/20/2022   Family  History  Problem Relation Age of Onset   Hypertension Mother    Stroke Mother    Heart disease Mother    Hypertension Father    Stroke Father    Heart disease Father    Arthritis Sister    Diabetes Sister    Cancer Sister    Diabetes Brother    Hypertension Brother    Dementia Brother    Stroke Brother    Diabetes Brother    Hypertension Brother    Dementia Brother    Breast cancer Other 47   Cancer Other        breast   Diabetes Other    Diabetes Maternal Grandfather    Diabetes Maternal Grandmother    Diabetes Brother     Hypertension Brother    Stroke Brother    Stroke Brother    Diabetes Maternal Aunt    Diabetes Sister    Stroke Sister    Kidney disease Neg Hx    Bladder Cancer Neg Hx    Social History   Socioeconomic History   Marital status: Married    Spouse name: Not on file   Number of children: Not on file   Years of education: Not on file   Highest education level: Some college, no degree  Occupational History   Not on file  Tobacco Use   Smoking status: Never   Smokeless tobacco: Never  Vaping Use   Vaping status: Never Used  Substance and Sexual Activity   Alcohol  use: Yes    Alcohol /week: 0.0 - 1.0 standard drinks of alcohol     Comment: RARELY   Drug use: No   Sexual activity: Not Currently  Other Topics Concern   Not on file  Social History Narrative   Lives in [place] with husband.      Work - retired Surveyor, Minerals   Social Drivers of Corporate Investment Banker Strain: Low Risk  (09/15/2024)   Overall Financial Resource Strain (CARDIA)    Difficulty of Paying Living Expenses: Not hard at all  Food Insecurity: No Food Insecurity (09/15/2024)   Hunger Vital Sign    Worried About Running Out of Food in the Last Year: Never true    Ran Out of Food in the Last Year: Never true  Transportation Needs: No Transportation Needs (09/15/2024)   PRAPARE - Administrator, Civil Service (Medical): No    Lack of Transportation (Non-Medical): No  Physical Activity: Insufficiently Active (09/15/2024)   Exercise Vital Sign    Days of Exercise per Week: 1 day    Minutes of Exercise per Session: 20 min  Stress: Stress Concern Present (09/15/2024)   [employer] of Occupational Health - Occupational Stress Questionnaire    Feeling of Stress: To some extent  Social Connections: Socially Integrated (09/15/2024)   Social Connection and Isolation Panel    Frequency of Communication with Friends and Family: More than three times a week    Frequency of Social  Gatherings with Friends and Family: Twice a week    Attends Religious Services: 1 to 4 times per year    Active Member of [employer] or Organizations: Yes    Attends Engineer, Structural: More than 4 times per year    Marital Status: Married     Review of Systems  Constitutional:  Negative for appetite change.       Concern regarding weight gain.   HENT:  Negative for congestion, sinus pressure and sore throat.  Eyes:  Negative for pain and visual disturbance.  Respiratory:  Negative for cough, chest tightness and shortness of breath.   Cardiovascular:  Negative for chest pain, palpitations and leg swelling.  Gastrointestinal:  Negative for abdominal pain, diarrhea, nausea and vomiting.  Genitourinary:  Negative for difficulty urinating and dysuria.  Musculoskeletal:  Positive for back pain. Negative for joint swelling and myalgias.  Skin:  Negative for color change and rash.  Neurological:  Negative for dizziness and headaches.  Hematological:  Negative for adenopathy. Does not bruise/bleed easily.  Psychiatric/Behavioral:  Negative for agitation and dysphoric mood.        Objective:     BP 116/78   Pulse 67   Temp 98.6 F (37 C) (Oral)   Ht 5' 5 (1.651 m)   Wt 206 lb 9.6 oz (93.7 kg)   SpO2 97%   BMI 34.38 kg/m  Wt Readings from Last 3 Encounters:  09/17/24 206 lb 9.6 oz (93.7 kg)  06/21/24 202 lb 2.9 oz (91.7 kg)  06/11/24 201 lb (91.2 kg)    Physical Exam Vitals reviewed.  Constitutional:      General: She is not in acute distress.    Appearance: Normal appearance. She is well-developed.  HENT:     Head: Normocephalic and atraumatic.     Right Ear: External ear normal.     Left Ear: External ear normal.     Mouth/Throat:     Pharynx: No oropharyngeal exudate or posterior oropharyngeal erythema.  Eyes:     General: No scleral icterus.       Right eye: No discharge.        Left eye: No discharge.     Conjunctiva/sclera: Conjunctivae normal.  Neck:      Thyroid : No thyromegaly.  Cardiovascular:     Rate and Rhythm: Normal rate and regular rhythm.  Pulmonary:     Effort: No tachypnea, accessory muscle usage or respiratory distress.     Breath sounds: Normal breath sounds. No decreased breath sounds or wheezing.  Chest:  Breasts:    Right: No inverted nipple, mass, nipple discharge or tenderness (no axillary adenopathy).     Left: No inverted nipple, mass, nipple discharge or tenderness (no axilarry adenopathy).  Abdominal:     General: Bowel sounds are normal.     Palpations: Abdomen is soft.     Tenderness: There is no abdominal tenderness.  Musculoskeletal:        General: No swelling or tenderness.     Cervical back: Neck supple.  Lymphadenopathy:     Cervical: No cervical adenopathy.  Skin:    Findings: No erythema or rash.  Neurological:     Mental Status: She is alert and oriented to person, place, and time.  Psychiatric:        Mood and Affect: Mood normal.        Behavior: Behavior normal.         Outpatient Encounter Medications as of 09/17/2024  Medication Sig   ALPRAZolam  (XANAX ) 0.25 MG tablet Take 1 tablet (0.25 mg total) by mouth daily as needed for anxiety.   Azelastine  HCl 137 MCG/SPRAY SOLN Place 2 sprays into both nostrils in the morning and at bedtime.   cholecalciferol  (VITAMIN D ) 25 MCG (1000 UNIT) tablet Take 1,000 Units by mouth daily.   Ferrous Sulfate  (IRON) 325 (65 Fe) MG TABS Take 325 mg by mouth daily.   fexofenadine  (ALLEGRA ) 180 MG tablet Take 1 tablet (180 mg total) by mouth daily.  fluticasone  (FLONASE ) 50 MCG/ACT nasal spray Place 2 sprays into both nostrils daily.   gabapentin  (NEURONTIN ) 300 MG capsule Take 300 mg by mouth 2 (two) times daily.   lidocaine  (LIDODERM ) 5 % SMARTSIG:2 Patch(s) T-DERMAL Daily   losartan -hydrochlorothiazide  (HYZAAR) 50-12.5 MG tablet Take 1 tablet by mouth daily.   metFORMIN  (GLUCOPHAGE ) 500 MG tablet Take 1 tablet (500 mg total) by mouth daily.   Multiple  Vitamin (MULTIVITAMIN WITH MINERALS) TABS tablet Take 1 tablet by mouth daily.   OneTouch Delica Lancets 30G MISC Use to check blood sugar x per day. DX E11.9.   ONETOUCH VERIO test strip USE TO CHECK BLOOD SUGAR TWICE A DAY   sertraline  (ZOLOFT ) 100 MG tablet TAKE 1/2 TABLET BY MOUTH (50 MG) BY MOUTH DAILY   simvastatin  (ZOCOR ) 10 MG tablet TAKE 1/2 TABLET (5 MG TOTAL) BY MOUTH AT BEDTIME.   traMADol  (ULTRAM ) 50 MG tablet Take 1-2 tablets as needed for severe pain. Not to exceed two doses (4 tablets, 200 mg) daily   traZODone  (DESYREL ) 100 MG tablet TAKE 1 TABLET BY MOUTH EVERYDAY AT BEDTIME   [DISCONTINUED] amoxicillin -clavulanate (AUGMENTIN ) 875-125 MG tablet Take 1 tablet by mouth every 12 (twelve) hours.   [DISCONTINUED] fluticasone  (FLONASE ) 50 MCG/ACT nasal spray Place 2 sprays into both nostrils daily.   [DISCONTINUED] losartan -hydrochlorothiazide  (HYZAAR) 50-12.5 MG tablet TAKE 1 TABLET BY MOUTH EVERY DAY   [DISCONTINUED] metFORMIN  (GLUCOPHAGE ) 500 MG tablet TAKE 1 TABLET (500 MG TOTAL) BY MOUTH DAILY.   [DISCONTINUED] sertraline  (ZOLOFT ) 100 MG tablet TAKE 1/2 TABLET BY MOUTH (50 MG) BY MOUTH DAILY   [DISCONTINUED] traZODone  (DESYREL ) 100 MG tablet TAKE 1 TABLET BY MOUTH EVERYDAY AT BEDTIME   No facility-administered encounter medications on file as of 09/17/2024.     Lab Results  Component Value Date   WBC 4.0 06/09/2024   HGB 12.4 06/09/2024   HCT 36.9 06/09/2024   PLT 179.0 06/09/2024   GLUCOSE 96 09/15/2024   CHOL 158 09/15/2024   TRIG 65.0 09/15/2024   HDL 74.10 09/15/2024   LDLDIRECT 146.5 09/19/2012   LDLCALC 71 09/15/2024   ALT 11 09/15/2024   AST 16 09/15/2024   NA 143 09/15/2024   K 4.1 09/15/2024   CL 103 09/15/2024   CREATININE 0.73 09/15/2024   BUN 23 09/15/2024   CO2 33 (H) 09/15/2024   TSH 3.17 06/09/2024   INR 1.0 08/21/2019   HGBA1C 6.0 09/15/2024   MICROALBUR 2.1 (H) 06/09/2024       Assessment & Plan:  Healthcare maintenance Assessment &  Plan: Physical today 09/17/24.  Mammogram 11/19/23 - Birads I.  Schedule f/u mammogram. Colonoscopy 03/13/21 - tubular adenoma (rectal polyp).  Recommended f/u in 7 years.     Visit for screening mammogram -     3D Screening Mammogram, Left and Right; Future  Diabetes mellitus type 2, diet-controlled (HCC) Assessment & Plan: Low carb diet and exercise.  Follow met b and a1c. Saw GI 05/24/23.  Dysphagia secondary to tight LAGB. Resolved after fluid removed. Continue diet and exercise as tolerated. Follow met b and A1c.  Lab Results  Component Value Date   HGBA1C 6.0 09/15/2024    Orders: -     Basic metabolic panel with GFR; Future -     Hemoglobin A1c; Future  Hypercholesterolemia Assessment & Plan: Continue simvastatin .  Low cholesterol diet and exercise.  Follow lipid panel.  Lab Results  Component Value Date   CHOL 158 09/15/2024   HDL 74.10 09/15/2024   LDLCALC  71 09/15/2024   LDLDIRECT 146.5 09/19/2012   TRIG 65.0 09/15/2024   CHOLHDL 2 09/15/2024     Orders: -     Lipid panel; Future -     Hepatic function panel; Future -     CBC with Differential/Platelet; Future  Primary hypertension  Chronic low back pain (1ry area of Pain) (Bilateral) (R>L) w/o sciatica Assessment & Plan: Followed by Dr Colon. Has tramadol .    Depression, major, single episode, mild Assessment & Plan: Continue on zoloft .  Increased stress as outlined. Discussed. Have provided counseling information previously. Follow.    Essential (primary) hypertension Assessment & Plan: Blood pressure as outlined.  Continue losartan benson.  Follow pressures.  Follow metabolic panel. No change in medication today.    Other orders -     Fluticasone  Propionate; Place 2 sprays into both nostrils daily.  Dispense: 48 mL; Refill: 1 -     Losartan  Potassium-HCTZ; Take 1 tablet by mouth daily.  Dispense: 90 tablet; Refill: 2 -     metFORMIN  HCl; Take 1 tablet (500 mg total) by mouth daily.  Dispense: 90  tablet; Refill: 1 -     Sertraline  HCl; TAKE 1/2 TABLET BY MOUTH (50 MG) BY MOUTH DAILY  Dispense: 45 tablet; Refill: 2 -     traZODone  HCl; TAKE 1 TABLET BY MOUTH EVERYDAY AT BEDTIME  Dispense: 90 tablet; Refill: 0     Allena Hamilton, MD

## 2024-09-17 NOTE — Assessment & Plan Note (Addendum)
 Physical today 09/17/24.  Mammogram 11/19/23 - Birads I.  Schedule f/u mammogram. Colonoscopy 03/13/21 - tubular adenoma (rectal polyp).  Recommended f/u in 7 years.

## 2024-09-24 ENCOUNTER — Encounter: Payer: Self-pay | Admitting: Internal Medicine

## 2024-09-25 NOTE — Telephone Encounter (Signed)
 Reviewed chart. It appears that he is in ER now. It appears they discussed with urology and was referred to ED. Please call Erin Camacho. Please put this informatio in Erin Camacho chart.

## 2024-09-27 ENCOUNTER — Encounter: Payer: Self-pay | Admitting: Internal Medicine

## 2024-09-27 NOTE — Assessment & Plan Note (Signed)
 Continue simvastatin .  Low cholesterol diet and exercise.  Follow lipid panel.  Lab Results  Component Value Date   CHOL 158 09/15/2024   HDL 74.10 09/15/2024   LDLCALC 71 09/15/2024   LDLDIRECT 146.5 09/19/2012   TRIG 65.0 09/15/2024   CHOLHDL 2 09/15/2024

## 2024-09-27 NOTE — Assessment & Plan Note (Signed)
 Blood pressure as outlined.  Continue losartan /hctz.  Follow pressures.  Follow metabolic panel. No change in medication today.

## 2024-09-27 NOTE — Assessment & Plan Note (Signed)
 Followed by Dr Colon. Has tramadol .

## 2024-09-27 NOTE — Assessment & Plan Note (Signed)
 Continue on zoloft .  Increased stress as outlined. Discussed. Have provided counseling information previously. Follow.

## 2024-09-27 NOTE — Assessment & Plan Note (Signed)
 Low carb diet and exercise.  Follow met b and a1c. Saw GI 05/24/23.  Dysphagia secondary to tight LAGB. Resolved after fluid removed. Continue diet and exercise as tolerated. Follow met b and A1c.  Lab Results  Component Value Date   HGBA1C 6.0 09/15/2024

## 2024-09-30 DIAGNOSIS — M19011 Primary osteoarthritis, right shoulder: Secondary | ICD-10-CM | POA: Diagnosis not present

## 2024-10-06 NOTE — Telephone Encounter (Signed)
 open in error

## 2024-11-23 ENCOUNTER — Ambulatory Visit: Admitting: Internal Medicine

## 2024-11-23 ENCOUNTER — Ambulatory Visit

## 2024-12-02 ENCOUNTER — Other Ambulatory Visit: Payer: Self-pay | Admitting: Internal Medicine

## 2025-01-14 ENCOUNTER — Other Ambulatory Visit

## 2025-01-18 ENCOUNTER — Ambulatory Visit: Admitting: Internal Medicine

## 2025-03-24 ENCOUNTER — Ambulatory Visit
